# Patient Record
Sex: Male | Born: 1945
Health system: Southern US, Community
[De-identification: ages and names within clinical notes are randomized; demographics above are authoritative.]

## PROBLEM LIST (undated history)

## (undated) DIAGNOSIS — K5520 Angiodysplasia of colon without hemorrhage: Secondary | ICD-10-CM

## (undated) DIAGNOSIS — I10 Essential (primary) hypertension: Secondary | ICD-10-CM

## (undated) DIAGNOSIS — I350 Nonrheumatic aortic (valve) stenosis: Secondary | ICD-10-CM

## (undated) DIAGNOSIS — M79606 Pain in leg, unspecified: Secondary | ICD-10-CM

## (undated) DIAGNOSIS — N529 Male erectile dysfunction, unspecified: Secondary | ICD-10-CM

## (undated) DIAGNOSIS — C801 Malignant (primary) neoplasm, unspecified: Secondary | ICD-10-CM

## (undated) DIAGNOSIS — E119 Type 2 diabetes mellitus without complications: Secondary | ICD-10-CM

## (undated) DIAGNOSIS — R972 Elevated prostate specific antigen [PSA]: Secondary | ICD-10-CM

## (undated) DIAGNOSIS — Z9289 Personal history of other medical treatment: Secondary | ICD-10-CM

## (undated) DIAGNOSIS — E669 Obesity, unspecified: Secondary | ICD-10-CM

## (undated) DIAGNOSIS — E039 Hypothyroidism, unspecified: Secondary | ICD-10-CM

## (undated) DIAGNOSIS — E785 Hyperlipidemia, unspecified: Secondary | ICD-10-CM

## (undated) DIAGNOSIS — R011 Cardiac murmur, unspecified: Secondary | ICD-10-CM

## (undated) HISTORY — DX: Nonrheumatic aortic (valve) stenosis: I35.0

## (undated) HISTORY — DX: Type 2 diabetes mellitus without complications: E11.9

## (undated) HISTORY — PX: COLONOSCOPY: SHX174

## (undated) HISTORY — DX: Cardiac murmur, unspecified: R01.1

## (undated) HISTORY — PX: POLYPECTOMY: SHX149

## (undated) HISTORY — DX: Hyperlipidemia, unspecified: E78.5

## (undated) HISTORY — DX: Elevated prostate specific antigen (PSA): R97.20

## (undated) HISTORY — DX: Personal history of other medical treatment: Z92.89

## (undated) HISTORY — DX: Essential (primary) hypertension: I10

## (undated) HISTORY — DX: Malignant (primary) neoplasm, unspecified: C80.1

## (undated) HISTORY — DX: Pain in leg, unspecified: M79.606

## (undated) HISTORY — DX: Obesity, unspecified: E66.9

## (undated) HISTORY — DX: Hypothyroidism, unspecified: E03.9

## (undated) HISTORY — DX: Angiodysplasia of colon without hemorrhage: K55.20

## (undated) HISTORY — PX: COLONOSCOPY W/ BIOPSIES AND POLYPECTOMY: SHX1376

## (undated) HISTORY — DX: Male erectile dysfunction, unspecified: N52.9

---

## 2001-03-23 ENCOUNTER — Encounter: Payer: Self-pay | Admitting: Family Medicine

## 2001-03-23 ENCOUNTER — Encounter: Admission: RE | Admit: 2001-03-23 | Discharge: 2001-03-23 | Payer: Self-pay | Admitting: Family Medicine

## 2004-01-16 ENCOUNTER — Encounter: Admission: RE | Admit: 2004-01-16 | Discharge: 2004-01-16 | Payer: Self-pay | Admitting: Family Medicine

## 2004-06-25 ENCOUNTER — Ambulatory Visit: Payer: Self-pay | Admitting: Family Medicine

## 2004-10-29 ENCOUNTER — Ambulatory Visit: Payer: Self-pay | Admitting: Family Medicine

## 2004-11-03 ENCOUNTER — Ambulatory Visit: Payer: Self-pay | Admitting: Family Medicine

## 2004-12-17 ENCOUNTER — Ambulatory Visit: Payer: Self-pay | Admitting: Family Medicine

## 2004-12-24 ENCOUNTER — Ambulatory Visit: Payer: Self-pay | Admitting: Family Medicine

## 2005-01-07 ENCOUNTER — Ambulatory Visit: Payer: Self-pay | Admitting: Family Medicine

## 2005-02-11 ENCOUNTER — Ambulatory Visit: Payer: Self-pay | Admitting: Family Medicine

## 2005-03-11 ENCOUNTER — Ambulatory Visit: Payer: Self-pay | Admitting: Family Medicine

## 2005-04-06 ENCOUNTER — Ambulatory Visit: Payer: Self-pay | Admitting: Gastroenterology

## 2005-04-19 ENCOUNTER — Ambulatory Visit: Payer: Self-pay | Admitting: Gastroenterology

## 2005-04-19 ENCOUNTER — Encounter (INDEPENDENT_AMBULATORY_CARE_PROVIDER_SITE_OTHER): Payer: Self-pay | Admitting: Specialist

## 2005-12-30 ENCOUNTER — Ambulatory Visit: Payer: Self-pay | Admitting: Family Medicine

## 2006-01-06 ENCOUNTER — Ambulatory Visit: Payer: Self-pay | Admitting: Family Medicine

## 2006-02-24 ENCOUNTER — Ambulatory Visit: Payer: Self-pay | Admitting: Family Medicine

## 2006-03-24 ENCOUNTER — Ambulatory Visit: Payer: Self-pay | Admitting: Family Medicine

## 2006-12-19 ENCOUNTER — Ambulatory Visit: Payer: Self-pay | Admitting: Family Medicine

## 2007-01-05 ENCOUNTER — Ambulatory Visit: Payer: Self-pay | Admitting: Family Medicine

## 2007-01-05 LAB — CONVERTED CEMR LAB
ALT: 26 units/L (ref 0–40)
AST: 24 units/L (ref 0–37)
Albumin: 4 g/dL (ref 3.5–5.2)
Alkaline Phosphatase: 54 units/L (ref 39–117)
BUN: 18 mg/dL (ref 6–23)
Basophils Absolute: 0 10*3/uL (ref 0.0–0.1)
Basophils Relative: 0.7 % (ref 0.0–1.0)
Bilirubin, Direct: 0.1 mg/dL (ref 0.0–0.3)
CO2: 30 meq/L (ref 19–32)
Calcium: 9 mg/dL (ref 8.4–10.5)
Chloride: 103 meq/L (ref 96–112)
Cholesterol: 160 mg/dL (ref 0–200)
Creatinine, Ser: 0.9 mg/dL (ref 0.4–1.5)
Creatinine,U: 146.5 mg/dL
Eosinophils Absolute: 0.2 10*3/uL (ref 0.0–0.6)
Eosinophils Relative: 4.1 % (ref 0.0–5.0)
GFR calc Af Amer: 111 mL/min
GFR calc non Af Amer: 92 mL/min
Glucose, Bld: 132 mg/dL — ABNORMAL HIGH (ref 70–99)
HCT: 39.7 % (ref 39.0–52.0)
HDL: 51.5 mg/dL (ref >39.0)
Hemoglobin: 13.2 g/dL (ref 13.0–17.0)
Hgb A1c MFr Bld: 7 % — ABNORMAL HIGH (ref 4.6–6.0)
LDL Cholesterol: 76 mg/dL (ref 0–99)
Lymphocytes Relative: 29.7 % (ref 12.0–46.0)
MCHC: 33.1 g/dL (ref 30.0–36.0)
MCV: 83.3 fL (ref 78.0–100.0)
Microalb Creat Ratio: 3.4 mg/g (ref 0.0–30.0)
Microalb, Ur: 0.5 mg/dL (ref 0.0–1.9)
Monocytes Absolute: 0.4 10*3/uL (ref 0.2–0.7)
Monocytes Relative: 7 % (ref 3.0–11.0)
Neutro Abs: 3.5 10*3/uL (ref 1.4–7.7)
Neutrophils Relative %: 58.5 % (ref 43.0–77.0)
PSA: 1.99 ng/mL (ref 0.10–4.00)
Platelets: 249 10*3/uL (ref 150–400)
Potassium: 4.1 meq/L (ref 3.5–5.1)
RBC: 4.76 M/uL (ref 4.22–5.81)
RDW: 13.4 % (ref 11.5–14.6)
Sodium: 141 meq/L (ref 135–145)
TSH: 4.41 microintl units/mL (ref 0.35–5.50)
Total Bilirubin: 0.7 mg/dL (ref 0.3–1.2)
Total CHOL/HDL Ratio: 3.1
Total Protein: 7 g/dL (ref 6.0–8.3)
Triglycerides: 162 mg/dL — ABNORMAL HIGH (ref 0–149)
VLDL: 32 mg/dL (ref 0–40)
WBC: 5.9 10*3/uL (ref 4.5–10.5)

## 2007-01-12 ENCOUNTER — Ambulatory Visit: Payer: Self-pay | Admitting: Family Medicine

## 2007-07-06 ENCOUNTER — Ambulatory Visit: Payer: Self-pay | Admitting: Family Medicine

## 2007-07-06 DIAGNOSIS — D485 Neoplasm of uncertain behavior of skin: Secondary | ICD-10-CM

## 2007-07-06 DIAGNOSIS — E119 Type 2 diabetes mellitus without complications: Secondary | ICD-10-CM

## 2007-07-06 DIAGNOSIS — E039 Hypothyroidism, unspecified: Secondary | ICD-10-CM

## 2007-07-06 DIAGNOSIS — E785 Hyperlipidemia, unspecified: Secondary | ICD-10-CM | POA: Insufficient documentation

## 2007-07-06 DIAGNOSIS — I10 Essential (primary) hypertension: Secondary | ICD-10-CM

## 2007-07-06 HISTORY — DX: Hyperlipidemia, unspecified: E78.5

## 2007-07-06 HISTORY — DX: Essential (primary) hypertension: I10

## 2007-07-06 HISTORY — DX: Hypothyroidism, unspecified: E03.9

## 2007-07-06 HISTORY — DX: Type 2 diabetes mellitus without complications: E11.9

## 2007-07-06 LAB — CONVERTED CEMR LAB
BUN: 19 mg/dL (ref 6–23)
CO2: 31 meq/L (ref 19–32)
Calcium: 9.3 mg/dL (ref 8.4–10.5)
Chloride: 105 meq/L (ref 96–112)
Creatinine, Ser: 1.1 mg/dL (ref 0.4–1.5)
GFR calc Af Amer: 88 mL/min
GFR calc non Af Amer: 73 mL/min
Glucose, Bld: 132 mg/dL — ABNORMAL HIGH (ref 70–99)
Hgb A1c MFr Bld: 6.9 % — ABNORMAL HIGH (ref 4.6–6.0)
Potassium: 3.8 meq/L (ref 3.5–5.1)
Sodium: 143 meq/L (ref 135–145)

## 2007-08-03 ENCOUNTER — Ambulatory Visit: Payer: Self-pay | Admitting: Family Medicine

## 2007-08-03 DIAGNOSIS — L820 Inflamed seborrheic keratosis: Secondary | ICD-10-CM

## 2007-12-07 ENCOUNTER — Ambulatory Visit: Payer: Self-pay | Admitting: Family Medicine

## 2007-12-07 LAB — CONVERTED CEMR LAB
ALT: 28 units/L (ref 0–53)
AST: 24 units/L (ref 0–37)
Albumin: 3.8 g/dL (ref 3.5–5.2)
Alkaline Phosphatase: 44 units/L (ref 39–117)
BUN: 18 mg/dL (ref 6–23)
Basophils Absolute: 0 10*3/uL (ref 0.0–0.1)
Basophils Relative: 0.2 % (ref 0.0–1.0)
Bilirubin Urine: NEGATIVE
Bilirubin, Direct: 0.1 mg/dL (ref 0.0–0.3)
CO2: 32 meq/L (ref 19–32)
Calcium: 8.9 mg/dL (ref 8.4–10.5)
Chloride: 107 meq/L (ref 96–112)
Cholesterol: 178 mg/dL (ref 0–200)
Creatinine, Ser: 0.9 mg/dL (ref 0.4–1.5)
Creatinine,U: 129.6 mg/dL
Eosinophils Absolute: 0.2 10*3/uL (ref 0.0–0.7)
Eosinophils Relative: 4.7 % (ref 0.0–5.0)
GFR calc Af Amer: 111 mL/min
GFR calc non Af Amer: 91 mL/min
Glucose, Bld: 123 mg/dL — ABNORMAL HIGH (ref 70–99)
Glucose, Urine, Semiquant: NEGATIVE
HCT: 38.4 % — ABNORMAL LOW (ref 39.0–52.0)
HDL: 57.4 mg/dL (ref >39.0)
Hemoglobin: 13 g/dL (ref 13.0–17.0)
Hgb A1c MFr Bld: 6.9 % — ABNORMAL HIGH (ref 4.6–6.0)
Ketones, urine, test strip: NEGATIVE
LDL Cholesterol: 96 mg/dL (ref 0–99)
Lymphocytes Relative: 37.2 % (ref 12.0–46.0)
MCHC: 33.7 g/dL (ref 30.0–36.0)
MCV: 83.3 fL (ref 78.0–100.0)
Microalb Creat Ratio: 17.7 mg/g (ref 0.0–30.0)
Microalb, Ur: 2.3 mg/dL — ABNORMAL HIGH (ref 0.0–1.9)
Monocytes Absolute: 0.3 10*3/uL (ref 0.1–1.0)
Monocytes Relative: 7.4 % (ref 3.0–12.0)
Neutro Abs: 2.1 10*3/uL (ref 1.4–7.7)
Neutrophils Relative %: 50.5 % (ref 43.0–77.0)
Nitrite: NEGATIVE
PSA: 2.03 ng/mL (ref 0.10–4.00)
Platelets: 204 10*3/uL (ref 150–400)
Potassium: 3.7 meq/L (ref 3.5–5.1)
Protein, U semiquant: NEGATIVE
RBC: 4.61 M/uL (ref 4.22–5.81)
RDW: 13.5 % (ref 11.5–14.6)
Sodium: 143 meq/L (ref 135–145)
Specific Gravity, Urine: 1.02
TSH: 6.81 microintl units/mL — ABNORMAL HIGH (ref 0.35–5.50)
Total Bilirubin: 0.8 mg/dL (ref 0.3–1.2)
Total CHOL/HDL Ratio: 3.1
Total Protein: 6.4 g/dL (ref 6.0–8.3)
Triglycerides: 121 mg/dL (ref 0–149)
Urobilinogen, UA: 0.2
VLDL: 24 mg/dL (ref 0–40)
WBC Urine, dipstick: NEGATIVE
WBC: 4.2 10*3/uL — ABNORMAL LOW (ref 4.5–10.5)
pH: 6

## 2007-12-14 ENCOUNTER — Ambulatory Visit: Payer: Self-pay | Admitting: Family Medicine

## 2007-12-14 DIAGNOSIS — F528 Other sexual dysfunction not due to a substance or known physiological condition: Secondary | ICD-10-CM

## 2007-12-14 DIAGNOSIS — N529 Male erectile dysfunction, unspecified: Secondary | ICD-10-CM | POA: Insufficient documentation

## 2008-01-11 ENCOUNTER — Ambulatory Visit: Payer: Self-pay | Admitting: Family Medicine

## 2008-05-23 ENCOUNTER — Ambulatory Visit: Payer: Self-pay | Admitting: Family Medicine

## 2008-12-12 ENCOUNTER — Ambulatory Visit: Payer: Self-pay | Admitting: Family Medicine

## 2008-12-12 LAB — CONVERTED CEMR LAB
ALT: 34 units/L (ref 0–53)
AST: 25 units/L (ref 0–37)
Albumin: 3.9 g/dL (ref 3.5–5.2)
Alkaline Phosphatase: 47 units/L (ref 39–117)
BUN: 16 mg/dL (ref 6–23)
Basophils Absolute: 0 10*3/uL (ref 0.0–0.1)
Basophils Relative: 0.3 % (ref 0.0–3.0)
Bilirubin Urine: NEGATIVE
Bilirubin, Direct: 0.1 mg/dL (ref 0.0–0.3)
Blood in Urine, dipstick: NEGATIVE
CO2: 33 meq/L — ABNORMAL HIGH (ref 19–32)
Calcium: 9.1 mg/dL (ref 8.4–10.5)
Chloride: 105 meq/L (ref 96–112)
Cholesterol: 186 mg/dL (ref 0–200)
Creatinine, Ser: 0.9 mg/dL (ref 0.4–1.5)
Creatinine,U: 110.2 mg/dL
Eosinophils Absolute: 0.3 10*3/uL (ref 0.0–0.7)
Eosinophils Relative: 6.6 % — ABNORMAL HIGH (ref 0.0–5.0)
GFR calc non Af Amer: 110.21 mL/min (ref >60)
Glucose, Bld: 118 mg/dL — ABNORMAL HIGH (ref 70–99)
Glucose, Urine, Semiquant: NEGATIVE
HCT: 37.4 % — ABNORMAL LOW (ref 39.0–52.0)
HDL: 56.6 mg/dL (ref >39.00)
Hemoglobin: 12.7 g/dL — ABNORMAL LOW (ref 13.0–17.0)
Hgb A1c MFr Bld: 6.6 % — ABNORMAL HIGH (ref 4.6–6.5)
Ketones, urine, test strip: NEGATIVE
LDL Cholesterol: 100 mg/dL — ABNORMAL HIGH (ref 0–99)
Lymphocytes Relative: 38.4 % (ref 12.0–46.0)
Lymphs Abs: 1.5 10*3/uL (ref 0.7–4.0)
MCHC: 34 g/dL (ref 30.0–36.0)
MCV: 84 fL (ref 78.0–100.0)
Microalb Creat Ratio: 0.9 mg/g (ref 0.0–30.0)
Microalb, Ur: 0.1 mg/dL (ref 0.0–1.9)
Monocytes Absolute: 0.4 10*3/uL (ref 0.1–1.0)
Monocytes Relative: 9.4 % (ref 3.0–12.0)
Neutro Abs: 1.8 10*3/uL (ref 1.4–7.7)
Neutrophils Relative %: 45.3 % (ref 43.0–77.0)
Nitrite: NEGATIVE
Platelets: 181 10*3/uL (ref 150.0–400.0)
Potassium: 4.4 meq/L (ref 3.5–5.1)
Protein, U semiquant: NEGATIVE
RBC: 4.45 M/uL (ref 4.22–5.81)
RDW: 13.7 % (ref 11.5–14.6)
Sodium: 144 meq/L (ref 135–145)
Specific Gravity, Urine: 1.01
TSH: 5.34 microintl units/mL (ref 0.35–5.50)
Total Bilirubin: 1 mg/dL (ref 0.3–1.2)
Total CHOL/HDL Ratio: 3
Total Protein: 6.7 g/dL (ref 6.0–8.3)
Triglycerides: 146 mg/dL (ref 0.0–149.0)
Urobilinogen, UA: 0.2
VLDL: 29.2 mg/dL (ref 0.0–40.0)
WBC Urine, dipstick: NEGATIVE
WBC: 4 10*3/uL — ABNORMAL LOW (ref 4.5–10.5)
pH: 7

## 2008-12-15 DIAGNOSIS — K625 Hemorrhage of anus and rectum: Secondary | ICD-10-CM | POA: Insufficient documentation

## 2008-12-18 ENCOUNTER — Encounter: Payer: Self-pay | Admitting: Family Medicine

## 2008-12-19 ENCOUNTER — Ambulatory Visit: Payer: Self-pay | Admitting: Family Medicine

## 2009-06-05 ENCOUNTER — Ambulatory Visit: Payer: Self-pay | Admitting: Family Medicine

## 2009-06-05 LAB — CONVERTED CEMR LAB
BUN: 21 mg/dL (ref 6–23)
CO2: 30 meq/L (ref 19–32)
Chloride: 104 meq/L (ref 96–112)
Creatinine, Ser: 1 mg/dL (ref 0.4–1.5)
Glucose, Bld: 122 mg/dL — ABNORMAL HIGH (ref 70–99)
Potassium: 3.8 meq/L (ref 3.5–5.1)

## 2009-06-13 ENCOUNTER — Ambulatory Visit: Payer: Self-pay | Admitting: Family Medicine

## 2009-06-24 ENCOUNTER — Ambulatory Visit: Payer: Self-pay | Admitting: Family Medicine

## 2009-07-24 ENCOUNTER — Ambulatory Visit: Payer: Self-pay | Admitting: Family Medicine

## 2009-08-25 ENCOUNTER — Ambulatory Visit: Payer: Self-pay | Admitting: Family Medicine

## 2009-09-25 ENCOUNTER — Ambulatory Visit: Payer: Self-pay | Admitting: Family Medicine

## 2009-12-25 ENCOUNTER — Ambulatory Visit: Payer: Self-pay | Admitting: Family Medicine

## 2009-12-25 LAB — CONVERTED CEMR LAB
AST: 23 units/L (ref 0–37)
Alkaline Phosphatase: 54 units/L (ref 39–117)
BUN: 15 mg/dL (ref 6–23)
Basophils Absolute: 0 10*3/uL (ref 0.0–0.1)
Creatinine, Ser: 0.9 mg/dL (ref 0.4–1.5)
Creatinine,U: 172.5 mg/dL
Direct LDL: 174.8 mg/dL
Eosinophils Relative: 3.1 % (ref 0.0–5.0)
Glucose, Bld: 129 mg/dL — ABNORMAL HIGH (ref 70–99)
HDL: 66.8 mg/dL (ref >39.00)
Hemoglobin: 13.2 g/dL (ref 13.0–17.0)
Ketones, urine, test strip: NEGATIVE
Lymphocytes Relative: 28.2 % (ref 12.0–46.0)
Microalb Creat Ratio: 0.6 mg/g (ref 0.0–30.0)
Monocytes Relative: 7.7 % (ref 3.0–12.0)
Neutro Abs: 2.8 10*3/uL (ref 1.4–7.7)
Nitrite: NEGATIVE
Platelets: 195 10*3/uL (ref 150.0–400.0)
Protein, U semiquant: NEGATIVE
RDW: 14.9 % — ABNORMAL HIGH (ref 11.5–14.6)
Sodium: 145 meq/L (ref 135–145)
Specific Gravity, Urine: 1.02
Total Bilirubin: 0.8 mg/dL (ref 0.3–1.2)
Total CHOL/HDL Ratio: 4
Triglycerides: 145 mg/dL (ref 0.0–149.0)
Urobilinogen, UA: 0.2
VLDL: 29 mg/dL (ref 0.0–40.0)
WBC Urine, dipstick: NEGATIVE
WBC: 4.7 10*3/uL (ref 4.5–10.5)

## 2010-01-01 ENCOUNTER — Ambulatory Visit: Payer: Self-pay | Admitting: Family Medicine

## 2010-01-07 ENCOUNTER — Telehealth: Payer: Self-pay | Admitting: Family Medicine

## 2010-02-13 ENCOUNTER — Encounter (INDEPENDENT_AMBULATORY_CARE_PROVIDER_SITE_OTHER): Payer: Self-pay | Admitting: *Deleted

## 2010-02-26 ENCOUNTER — Ambulatory Visit: Payer: Self-pay | Admitting: Family Medicine

## 2010-02-26 LAB — CONVERTED CEMR LAB
ALT: 28 units/L (ref 0–53)
AST: 23 units/L (ref 0–37)
Albumin: 4 g/dL (ref 3.5–5.2)
Alkaline Phosphatase: 49 units/L (ref 39–117)
Calcium: 8.9 mg/dL (ref 8.4–10.5)
Cholesterol: 220 mg/dL — ABNORMAL HIGH (ref 0–200)
Creatinine, Ser: 0.9 mg/dL (ref 0.4–1.5)
Direct LDL: 141.4 mg/dL
Hgb A1c MFr Bld: 6.9 % — ABNORMAL HIGH (ref 4.6–6.5)
Total Protein: 6.4 g/dL (ref 6.0–8.3)

## 2010-03-05 ENCOUNTER — Ambulatory Visit: Payer: Self-pay | Admitting: Family Medicine

## 2010-04-02 ENCOUNTER — Ambulatory Visit: Payer: Self-pay | Admitting: Family Medicine

## 2010-05-11 ENCOUNTER — Ambulatory Visit: Payer: Self-pay | Admitting: Family Medicine

## 2010-06-04 ENCOUNTER — Ambulatory Visit: Payer: Self-pay | Admitting: Family Medicine

## 2010-06-09 LAB — CONVERTED CEMR LAB
CO2: 30 meq/L (ref 19–32)
Calcium: 8.6 mg/dL (ref 8.4–10.5)
Creatinine, Ser: 1 mg/dL (ref 0.4–1.5)
Hgb A1c MFr Bld: 7.2 % — ABNORMAL HIGH (ref 4.6–6.5)

## 2010-09-03 ENCOUNTER — Ambulatory Visit
Admission: RE | Admit: 2010-09-03 | Discharge: 2010-09-03 | Payer: Self-pay | Source: Home / Self Care | Attending: Family Medicine | Admitting: Family Medicine

## 2010-09-03 ENCOUNTER — Other Ambulatory Visit: Payer: Self-pay | Admitting: Family Medicine

## 2010-09-03 LAB — BASIC METABOLIC PANEL
BUN: 15 mg/dL (ref 6–23)
CO2: 32 mEq/L (ref 19–32)
Calcium: 8.6 mg/dL (ref 8.4–10.5)
Chloride: 102 mEq/L (ref 96–112)
Creatinine, Ser: 0.8 mg/dL (ref 0.4–1.5)
GFR: 131.2 mL/min (ref >60.00)
Glucose, Bld: 118 mg/dL — ABNORMAL HIGH (ref 70–99)
Potassium: 3.5 mEq/L (ref 3.5–5.1)
Sodium: 140 mEq/L (ref 135–145)

## 2010-09-03 LAB — HEMOGLOBIN A1C: Hgb A1c MFr Bld: 7.5 % — ABNORMAL HIGH (ref 4.6–6.5)

## 2010-09-10 ENCOUNTER — Telehealth: Payer: Self-pay | Admitting: Family Medicine

## 2010-09-10 ENCOUNTER — Ambulatory Visit
Admission: RE | Admit: 2010-09-10 | Discharge: 2010-09-10 | Payer: Self-pay | Source: Home / Self Care | Attending: Family Medicine | Admitting: Family Medicine

## 2010-09-24 NOTE — Assessment & Plan Note (Signed)
Summary: 1 month rov/njr/pt rsc/cjr   Vital Signs:  Patient profile:   65 year old male Weight:      240 pounds Temp:     98.2 degrees F oral Pulse rate:   60 / minute BP sitting:   160 / 90  (left arm) Cuff size:   regular  Vitals Entered By: Kern Reap CMA Duncan Dull) (May 11, 2010 11:41 AM)  CC: follow-up visit Is Patient Diabetic? Yes Pain Assessment Patient in pain? no        CC:  follow-up visit.  History of Present Illness: Matthew Serrano is a 65 year old male, who comes in today for reevaluation of hypertension.  His blood pressure has been difficult to control.  He is currently on Monopril 80 mg q.a.m. Catapres, .4, b.i.d., Cardura 8 mg nightly labetalol 600 mg in the morning and 4 mg at bedtime, and Lasix 40 mg daily.  BP 160/90  Allergies: No Known Drug Allergies  Past History:  Past medical, surgical, family and social histories (including risk factors) reviewed for relevance to current acute and chronic problems.  Past Medical History: Reviewed history from 12/14/2007 and no changes required. Diabetes mellitus, type II Hyperlipidemia Hypertension Hypothyroidism erectile dysfunction  Past Surgical History: Reviewed history from 12/14/2007 and no changes required. Denies surgical history  Family History: Reviewed history from 07/06/2007 and no changes required. Family History Hypertension  Social History: Reviewed history from 07/06/2007 and no changes required. Single Current Smoker Alcohol use-no Drug use-no Regular exercise-no  Review of Systems      See HPI       Flu Vaccine Consent Questions     Do you have a history of severe allergic reactions to this vaccine? no    Any prior history of allergic reactions to egg and/or gelatin? no    Do you have a sensitivity to the preservative Thimersol? no    Do you have a past history of Guillan-Barre Syndrome? no    Do you currently have an acute febrile illness? no    Have you ever had a severe  reaction to latex? no    Vaccine information given and explained to patient? yes    Are you currently pregnant? no    Lot Number:[id number]   Exp Date:02/19/2010   Site Given  Left Deltoid IM   Physical Exam  General:  Well-developed,well-nourished,in no acute distress; alert,appropriate and cooperative throughout examination Heart:  160/90 right arm sitting position   Impression & Recommendations:  Problem # 1:  HYPERTENSION (ICD-401.9) Assessment Unchanged  His updated medication list for this problem includes:    Monopril 40 Mg Tabs (Fosinopril sodium) .Marland Kitchen... Take 2 tablet by mouth twice a day    Catapres 0.2 Mg Tabs (Clonidine hcl) .Marland Kitchen... 2 in am, 2 in pm    Doxazosin Mesylate 8 Mg Tabs (Doxazosin mesylate) .Marland Kitchen... Take one tab once daily    Labetalol Hcl 200 Mg Tabs (Labetalol hcl) .Marland KitchenMarland KitchenMarland KitchenMarland Kitchen 3  in am, 2 in pm    Furosemide 40 Mg Tabs (Furosemide) .Marland Kitchen... Take 1 tablet by mouth every morning  Complete Medication List: 1)  Glucophage 500 Mg Tabs (Metformin hcl) .... Take 1 tablet by mouth twice a day 2)  Glucotrol Xl 10 Mg Tb24 (Glipizide) .... Take 1 tablet by mouth twice a day 3)  Klor-con M20 20 Meq Tbcr (Potassium chloride crys cr) .... 2 by mouth daily 4)  Monopril 40 Mg Tabs (Fosinopril sodium) .... Take 2 tablet by mouth twice a day 5)  Viagra  50 Mg Tabs (Sildenafil citrate) .... Take 1/2 tablet by mouth as directed 6)  Catapres 0.2 Mg Tabs (Clonidine hcl) .... 2 in am, 2 in pm 7)  Ascriptin 325 Mg Tabs (Aspirin buf(alhyd-mghyd-cacar)) .... Once daily 8)  Doxazosin Mesylate 8 Mg Tabs (Doxazosin mesylate) .... Take one tab once daily 9)  Labetalol Hcl 200 Mg Tabs (Labetalol hcl) .... 3  in am, 2 in pm 10)  Synthroid 150 Mcg Tabs (Levothyroxine sodium) .... Take 1 tablet by mouth every morning 11)  Simvastatin 80 Mg Tabs (Simvastatin) .... Take one tab by mouth at bedtime 12)  Furosemide 40 Mg Tabs (Furosemide) .... Take 1 tablet by mouth every morning  Other Orders: Admin 1st  Vaccine (04540) Flu Vaccine 40yrs + (98119)  Patient Instructions: 1)  double the Lasix, take two of the 40-mg tablets in the morning.  Check a morning blood pressure and pulse daily.  Return in 3 weeks for follow-up

## 2010-09-24 NOTE — Letter (Signed)
Summary: Colonoscopy Letter  [facility]  [address] [place], [place] [address]   Phone: [phone]  Fax: [phone]      February 13, 2010 MRN: [id number]   Matthew Serrano [address] [place], [place]  [address]   Dear [name],   According to your medical record, it is time for you to schedule a Colonoscopy. The American Cancer Society recommends this procedure as a method to detect early colon cancer. Patients with a family history of colon cancer, or a personal history of colon polyps or inflammatory bowel disease are at increased risk.  This letter has beeen generated based on the recommendations made at the time of your procedure. If you feel that in your particular situation this may no longer apply, please contact our office.  Please call our office at [phone] to schedule this appointment or to update your records at your earliest convenience.  Thank you for cooperating with [place] to provide you with the very best care possible.   Sincerely,  Judie Petit T. Russella Dar, M.D.  [facility] [phone]

## 2010-09-24 NOTE — Assessment & Plan Note (Signed)
Summary: 4 WK ROV//SLM   Vital Signs:  Patient profile:   65 year old male Height:      68 inches Weight:      234 pounds BMI:     35.71 Temp:     98.7 degrees F oral BP sitting:   158 / 90  (left arm) Cuff size:   regular  Vitals Entered By: Kern Reap CMA Duncan Dull) (September 25, 2009 10:15 AM)  Reason for Visit follow up blood pressure  History of Present Illness: Matthew Serrano is a 65 year old male, married, nonsmoker, who comes in today for evaluation of hypertension.  His current treatment program is Monopril 80 mg b.i.d., Catapres, .4 mg b.i.d., Lasix, 20 mg b.i.d., Cardura 8 mg daily, labetalol 400 mg in the morning and 200 mg in the evening.  His BP is down in the 140 to 150 range, diastolic 80 to 90.  Pulse is 70 and regular.  Blood sugar in the 120 range.  A1c October 6.5  Allergies: No Known Drug Allergies  Past History:  Past medical, surgical, family and social histories (including risk factors) reviewed for relevance to current acute and chronic problems.  Past Medical History: Reviewed history from 12/14/2007 and no changes required. Diabetes mellitus, type II Hyperlipidemia Hypertension Hypothyroidism erectile dysfunction  Past Surgical History: Reviewed history from 12/14/2007 and no changes required. Denies surgical history  Family History: Reviewed history from 07/06/2007 and no changes required. Family History Hypertension  Social History: Reviewed history from 07/06/2007 and no changes required. Single Current Smoker Alcohol use-no Drug use-no Regular exercise-no  Review of Systems      See HPI  Physical Exam  General:  Well-developed,well-nourished,in no acute distress; alert,appropriate and cooperative throughout examination Heart:  160/90   Impression & Recommendations:  Problem # 1:  HYPERTENSION (ICD-401.9) Assessment Improved  His updated medication list for this problem includes:    Monopril 40 Mg Tabs (Fosinopril sodium)  .Marland Kitchen... Take 2 tablet by mouth twice a day    Catapres 0.2 Mg Tabs (Clonidine hcl) .Marland Kitchen... 2 in am, 2 in pm    Furosemide 20 Mg Tabs (Furosemide) .Marland Kitchen... Take one tab twice daily    Doxazosin Mesylate 8 Mg Tabs (Doxazosin mesylate) .Marland Kitchen... Take one tab once daily    Labetalol Hcl 200 Mg Tabs (Labetalol hcl) .Marland Kitchen... 2 in am, 2 in pm  Orders: Prescription Created Electronically [id number])  Complete Medication List: 1)  Glucophage 500 Mg Tabs (Metformin hcl) .... Take 1 tablet by mouth twice a day 2)  Glucotrol Xl 10 Mg Tb24 (Glipizide) .... Take 1 tablet by mouth twice a day 3)  Klor-con M20 20 Meq Tbcr (Potassium chloride crys cr) .... 2 by mouth daily 4)  Monopril 40 Mg Tabs (Fosinopril sodium) .... Take 2 tablet by mouth twice a day 5)  Viagra 50 Mg Tabs (Sildenafil citrate) .... Take 1/2 tablet by mouth as directed 6)  Catapres 0.2 Mg Tabs (Clonidine hcl) .... 2 in am, 2 in pm 7)  Furosemide 20 Mg Tabs (Furosemide) .... Take one tab twice daily 8)  Synthroid 100 Mcg Tabs (Levothyroxine sodium) .... Once daily 9)  Ascriptin 325 Mg Tabs (Aspirin buf(alhyd-mghyd-cacar)) .... Once daily 10)  Crestor 40 Mg Tabs (Rosuvastatin calcium) .Marland Kitchen.. 1 tab @ bedtime 11)  Doxazosin Mesylate 8 Mg Tabs (Doxazosin mesylate) .... Take one tab once daily 12)  Labetalol Hcl 200 Mg Tabs (Labetalol hcl) .... 2 in am, 2 in pm  Patient Instructions: 1)  increase the labetalol to  two tabs in the morning and two tabs at bedtime.  Check a morning blood pressure daily.  If in 4 weeks, your blood pressure is normal.  Continue that dose.  If not increase the labetalol to 3 tablets in the morning and two tablets at bedtime.  Return in 6 weeks for follow-up.  2)  If her pulse rate drops below 50 or he began to feel lightheaded when you stand up. ............ Call me. Prescriptions: LABETALOL HCL 200 MG TABS (LABETALOL HCL) 2 in am, 2 in pm  #400 x 3   Entered and Authorized by:   Roderick Pee MD   Signed by:   Roderick Pee MD on  09/25/2009   Method used:   [facility]        [facility]  [address] [phone]* (retail)       [address]       [place], [place]  [address]       Ph: [phone] or [phone]       Fax: [phone]   RxID:   [id number]

## 2010-09-24 NOTE — Assessment & Plan Note (Signed)
Summary: 63mo rov/mm   Vital Signs:  Patient profile:   65 year old male Weight:      238 pounds Temp:     98.5 degrees F oral BP sitting:   140 / 92  (left arm) Cuff size:   regular  Vitals Entered By: Kern Reap CMA Duncan Dull) (August 25, 2009 9:03 AM)  Reason for Visit follow up HTN  History of Present Illness: Matthew Serrano is a 65 year old male, nonsmoker, with diabetes and hypertension who comes in today for follow-up.  His diabetes is well controlled, although his fasting blood sugars average around 130 his hemoglobin A1c is 6.5%.  Last done two months ago.  His blood pressure runs 144/84.  His current medications are Monopril 40 mg b.i.d., Catapres .4, b.i.d., potassium 40 mEq.  Daily, Lasix, 20 mg b.i.d., Cardura, 8 mg nightly, and labetalol 200 mg b.i.d.  No side effects from medication.  Heart rate 60 to 70  Diabetes Management History:      He says that he is not exercising regularly.    Allergies: No Known Drug Allergies  Past History:  Past medical, surgical, family and social histories (including risk factors) reviewed for relevance to current acute and chronic problems.  Past Medical History: Reviewed history from 12/14/2007 and no changes required. Diabetes mellitus, type II Hyperlipidemia Hypertension Hypothyroidism erectile dysfunction  Past Surgical History: Reviewed history from 12/14/2007 and no changes required. Denies surgical history  Family History: Reviewed history from 07/06/2007 and no changes required. Family History Hypertension  Social History: Reviewed history from 07/06/2007 and no changes required. Single Current Smoker Alcohol use-no Drug use-no Regular exercise-no  Review of Systems      See HPI  Physical Exam  General:  Well-developed,well-nourished,in no acute distress; alert,appropriate and cooperative throughout examination Heart:  144/84 right arm sitting position   Impression & Recommendations:  Problem # 1:   HYPERTENSION (ICD-401.9) Assessment Improved  His updated medication list for this problem includes:    Monopril 40 Mg Tabs (Fosinopril sodium) .Marland Kitchen... Take 2 tablet by mouth twice a day    Catapres 0.2 Mg Tabs (Clonidine hcl) .Marland Kitchen... 2 in am, 2 in pm    Furosemide 20 Mg Tabs (Furosemide) .Marland Kitchen... Take one tab twice daily    Doxazosin Mesylate 8 Mg Tabs (Doxazosin mesylate) .Marland Kitchen... Take one tab once daily    Labetalol Hcl 200 Mg Tabs (Labetalol hcl) .Marland Kitchen... 2 in am, 1 in pm  Orders: Prescription Created Electronically [id number])  Complete Medication List: 1)  Glucophage 500 Mg Tabs (Metformin hcl) .... Take 1 tablet by mouth twice a day 2)  Glucotrol Xl 10 Mg Tb24 (Glipizide) .... Take 1 tablet by mouth twice a day 3)  Klor-con M20 20 Meq Tbcr (Potassium chloride crys cr) .... 2 by mouth daily 4)  Monopril 40 Mg Tabs (Fosinopril sodium) .... Take 2 tablet by mouth twice a day 5)  Viagra 50 Mg Tabs (Sildenafil citrate) .... Take 1/2 tablet by mouth as directed 6)  Catapres 0.2 Mg Tabs (Clonidine hcl) .... 2 in am, 2 in pm 7)  Furosemide 20 Mg Tabs (Furosemide) .... Take one tab twice daily 8)  Synthroid 100 Mcg Tabs (Levothyroxine sodium) .... Once daily 9)  Ascriptin 325 Mg Tabs (Aspirin buf(alhyd-mghyd-cacar)) .... Once daily 10)  Crestor 40 Mg Tabs (Rosuvastatin calcium) .Marland Kitchen.. 1 tab @ bedtime 11)  Doxazosin Mesylate 8 Mg Tabs (Doxazosin mesylate) .... Take one tab once daily 12)  Labetalol Hcl 200 Mg Tabs (Labetalol hcl) .Marland KitchenMarland KitchenMarland Kitchen  2 in am, 1 in pm  Patient Instructions: 1)  increase the labetalol by taking two tabs in the morning and one pill at bedtime.  If in two weeks,if  your blood pressure is not normal, then increase the labetalol to two tablets twice a day.  Return in 4 weeks for follow-up  Prescriptions: LABETALOL HCL 200 MG TABS (LABETALOL HCL) 2 in am, 1 in pm  #300 x 3   Entered and Authorized by:   Roderick Pee MD   Signed by:   Roderick Pee MD on 08/25/2009   Method used:    [facility]        [facility]  [address] [phone]* (retail)       [address]       [place], [place]  [address]       Ph: [phone] or [phone]       Fax: [phone]   RxID:   [id number]

## 2010-09-24 NOTE — Progress Notes (Signed)
Summary: generic  Phone Note Call from Patient Call back at Home Phone [phone]   Caller: vm Summary of Call: Lipitor needs to be generic.  [facility] [address]  Gave Dr. Tawanna Cooler wrong info Initial call taken by: Rudy Jew, RN,  Jan 07, 2010 2:10 PM  Follow-up for Phone Call        there is no generic Lipitor Follow-up by: Roderick Pee MD,  Jan 08, 2010 8:41 AM  Additional Follow-up for Phone Call Additional follow up Details #1::        left message on machine for patient  Additional Follow-up by: Kern Reap CMA Duncan Dull),  Jan 08, 2010 10:01 AM    New/Updated Medications: SIMVASTATIN 80 MG TABS (SIMVASTATIN) take one tab by mouth at bedtime Prescriptions: SIMVASTATIN 80 MG TABS (SIMVASTATIN) take one tab by mouth at bedtime  #90 x 3   Entered by:   Kern Reap CMA (AAMA)   Authorized by:   Roderick Pee MD   Signed by:   Kern Reap CMA (AAMA) on 01/14/2010   Method used:   [facility]        [facility]  [address] [phone]* (retail)       [address]       [place], [place]  [id number]       Ph: [phone] or [phone]       Fax: [phone]   RxID:   [id number]

## 2010-09-24 NOTE — Progress Notes (Signed)
Summary: rx clarification  Phone Note From Pharmacy   [facility]  [address] [phone]* Summary of Call: pharmacy clarification Initial call taken by: Kern Reap CMA Duncan Dull),  September 10, 2010 4:21 PM    New/Updated Medications: METFORMIN HCL 1000 MG TABS (METFORMIN HCL) take one tab before breakfast and half tab before evening meal Prescriptions: METFORMIN HCL 1000 MG TABS (METFORMIN HCL) take one tab before breakfast and half tab before evening meal  #150 x 3   Entered by:   Kern Reap CMA (AAMA)   Authorized by:   Roderick Pee MD   Signed by:   Kern Reap CMA (AAMA) on 09/10/2010   Method used:   [facility]        [facility]  [address] [phone]* (retail)       [address]       [place], [place]  [id number]       Ph: [phone] or [phone]       Fax: [phone]   RxID:   [id number]

## 2010-09-24 NOTE — Assessment & Plan Note (Signed)
Summary: CPX//SLM   Vital Signs:  Patient profile:   65 year old male Height:      68 inches Weight:      233 pounds Temp:     98.1 degrees F oral BP sitting:   140 / 90  (left arm) Cuff size:   regular  Vitals Entered By: Kern Reap CMA Duncan Dull) (Jan 01, 2010 9:54 AM) CC: cpx   CC:  cpx.  History of Present Illness: Matthew Serrano is a 65 year old male, nonsmoker, who comes in today for evaluation of metabolic syndrome with hypertension, diabetes, hyperlipidemia.  His diabetes is treated with Glucophage 500 mg b.i.d., Glucotrol 10 mg b.i.d. fasting blood sugar here was 129 with an A1c of 6.8.  Hypertension.  History of Monopril 40 mg b.i.d., Catapres .2, does two tabs in the morning and two tabs in the p.m., potassium 20 mEq two tabs daily, Lasix 20 mg b.i.d., labetalol 400 mg b.i.d.Marland Kitchen  Blood pressure 140/90.  He stopped taking his Crestor.  It was too expensive.  The Lipitor.  His lipids are not at, goal.  That's why we switched him to the Crestor.  However, because of cost issues.  Will go back on Lipitor.  He also uses fiber p.r.n. for ED.  He takes Synthroid 100 micrograms daily for hypothyroidism.  TSH level is 6.6.  Three.  Will increase Synthroid 250 micrograms daily.  He gets routine eye care..... February 2011 Dr. Emily Filbert.  Normal.  Routine dental care.  Colonoscopy done in GI showed some polyps.  He stood for recall to 5 years.  Tetanus 2008 seasonal flu 2010 Pneumovax 2006.  He recently had some constipation and a strained ago and noticed some bright red rectal bleeding.  Allergies: No Known Drug Allergies  Past History:  Past medical, surgical, family and social histories (including risk factors) reviewed, and no changes noted (except as noted below).  Past Medical History: Reviewed history from 12/14/2007 and no changes required. Diabetes mellitus, type II Hyperlipidemia Hypertension Hypothyroidism erectile dysfunction  Past Surgical History: Reviewed history  from 12/14/2007 and no changes required. Denies surgical history  Family History: Reviewed history from 07/06/2007 and no changes required. Family History Hypertension  Social History: Reviewed history from 07/06/2007 and no changes required. Single Current Smoker Alcohol use-no Drug use-no Regular exercise-no  Review of Systems      See HPI  Physical Exam  General:  Well-developed,well-nourished,in no acute distress; alert,appropriate and cooperative throughout examination Head:  Normocephalic and atraumatic without obvious abnormalities. No apparent alopecia or balding. Eyes:  No corneal or conjunctival inflammation noted. EOMI. Perrla. Funduscopic exam benign, without hemorrhages, exudates or papilledema. Vision grossly normal. Ears:  External ear exam shows no significant lesions or deformities.  Otoscopic examination reveals clear canals, tympanic membranes are intact bilaterally without bulging, retraction, inflammation or discharge. Hearing is grossly normal bilaterally. Nose:  External nasal examination shows no deformity or inflammation. Nasal mucosa are pink and moist without lesions or exudates. Mouth:  Oral mucosa and oropharynx without lesions or exudates.  Teeth in good repair. Neck:  No deformities, masses, or tenderness noted. Chest Wall:  No deformities, masses, tenderness or gynecomastia noted. Breasts:  No masses or gynecomastia noted Lungs:  Normal respiratory effort, chest expands symmetrically. Lungs are clear to auscultation, no crackles or wheezes. Heart:  Normal rate and regular rhythm. S1 and S2 normal without gallop, murmur, click, rub or other extra sounds. Abdomen:  Bowel sounds positive,abdomen soft and non-tender without masses, organomegaly or hernias noted. Rectal:  No external abnormalities noted. Normal sphincter tone. No rectal masses or tenderness....Marland KitchenMarland KitchenBrown stool, guaiac-positive Genitalia:  Testes bilaterally descended without nodularity,  tenderness or masses. No scrotal masses or lesions. No penis lesions or urethral discharge. Prostate:  Prostate gland firm and smooth, no enlargement, nodularity, tenderness, mass, asymmetry or induration. Msk:  No deformity or scoliosis noted of thoracic or lumbar spine.   Pulses:  R and L carotid,radial,femoral,dorsalis pedis and posterior tibial pulses are full and equal bilaterally Extremities:  No clubbing, cyanosis, edema, or deformity noted with normal full range of motion of all joints.   Neurologic:  No cranial nerve deficits noted. Station and gait are normal. Plantar reflexes are down-going bilaterally. DTRs are symmetrical throughout. Sensory, motor and coordinative functions appear intact. Skin:  Intact without suspicious lesions or rashes Cervical Nodes:  No lymphadenopathy noted Axillary Nodes:  No palpable lymphadenopathy Inguinal Nodes:  No significant adenopathy Psych:  Cognition and judgment appear intact. Alert and cooperative with normal attention span and concentration. No apparent delusions, illusions, hallucinations  Diabetes Management Exam:    Foot Exam (with socks and/or shoes not present):       Sensory-Pinprick/Light touch:          Left medial foot (L-4): normal          Left dorsal foot (L-5): normal          Left lateral foot (S-1): normal          Right medial foot (L-4): normal          Right dorsal foot (L-5): normal          Right lateral foot (S-1): normal       Sensory-Monofilament:          Left foot: normal          Right foot: normal       Inspection:          Left foot: normal          Right foot: normal       Nails:          Left foot: normal          Right foot: normal    Eye Exam:       Eye Exam done elsewhere          Date: 10/06/2009          Results: normal          Done by: Emily Filbert   Impression & Recommendations:  Problem # 1:  HEMORRHAGE OF RECTUM AND ANUS (ICD-569.3) Assessment Deteriorated  Problem # 2:  WELL ADULT EXAM  (ICD-V70.0) Assessment: Unchanged  Orders: Prescription Created Electronically [phone]) EKG w/ Interpretation (93000)  Problem # 3:  ERECTILE DYSFUNCTION, MILD (ICD-302.72) Assessment: Improved  His updated medication list for this problem includes:    Viagra 50 Mg Tabs (Sildenafil citrate) .Marland Kitchen... Take 1/2 tablet by mouth as directed  Orders: Prescription Created Electronically [phone])  Problem # 4:  HYPOTHYROIDISM (ICD-244.9) Assessment: Deteriorated  The following medications were removed from the medication list:    Synthroid 100 Mcg Tabs (Levothyroxine sodium) ..... Once daily His updated medication list for this problem includes:    Synthroid 150 Mcg Tabs (Levothyroxine sodium) .Marland Kitchen... Take 1 tablet by mouth every morning  Orders: Prescription Created Electronically [phone])  Problem # 5:  HYPERTENSION (ICD-401.9) Assessment: Unchanged  His updated medication list for this problem includes:    Monopril 40 Mg Tabs (Fosinopril sodium) .Marland Kitchen... Take 2  tablet by mouth twice a day    Catapres 0.2 Mg Tabs (Clonidine hcl) .Marland Kitchen... 2 in am, 2 in pm    Furosemide 20 Mg Tabs (Furosemide) .Marland Kitchen... Take one tab twice daily    Doxazosin Mesylate 8 Mg Tabs (Doxazosin mesylate) .Marland Kitchen... Take one tab once daily    Labetalol Hcl 200 Mg Tabs (Labetalol hcl) .Marland Kitchen... 2 in am, 2 in pm  Orders: Prescription Created Electronically [phone]) EKG w/ Interpretation (93000)  Problem # 6:  DIABETES MELLITUS, TYPE II (ICD-250.00) Assessment: Improved  His updated medication list for this problem includes:    Glucophage 500 Mg Tabs (Metformin hcl) .Marland Kitchen... Take 1 tablet by mouth twice a day    Glucotrol Xl 10 Mg Tb24 (Glipizide) .Marland Kitchen... Take 1 tablet by mouth twice a day    Monopril 40 Mg Tabs (Fosinopril sodium) .Marland Kitchen... Take 2 tablet by mouth twice a day    Ascriptin 325 Mg Tabs (Aspirin buf(alhyd-mghyd-cacar)) ..... Once daily  Orders: Prescription Created Electronically [phone])  Complete Medication List: 1)   Glucophage 500 Mg Tabs (Metformin hcl) .... Take 1 tablet by mouth twice a day 2)  Glucotrol Xl 10 Mg Tb24 (Glipizide) .... Take 1 tablet by mouth twice a day 3)  Klor-con M20 20 Meq Tbcr (Potassium chloride crys cr) .... 2 by mouth daily 4)  Monopril 40 Mg Tabs (Fosinopril sodium) .... Take 2 tablet by mouth twice a day 5)  Viagra 50 Mg Tabs (Sildenafil citrate) .... Take 1/2 tablet by mouth as directed 6)  Catapres 0.2 Mg Tabs (Clonidine hcl) .... 2 in am, 2 in pm 7)  Furosemide 20 Mg Tabs (Furosemide) .... Take one tab twice daily 8)  Ascriptin 325 Mg Tabs (Aspirin buf(alhyd-mghyd-cacar)) .... Once daily 9)  Doxazosin Mesylate 8 Mg Tabs (Doxazosin mesylate) .... Take one tab once daily 10)  Labetalol Hcl 200 Mg Tabs (Labetalol hcl) .... 2 in am, 2 in pm 11)  Lipitor 80 Mg Tabs (Atorvastatin calcium) .Marland Kitchen.. 1 tab @ bedtime 12)  Synthroid 150 Mcg Tabs (Levothyroxine sodium) .... Take 1 tablet by mouth every morning  Patient Instructions: 1)  take a stool softener daily to prevent constipation, and the rectal bleeding.  If the bleeding persists, follow-up with GI. 2)  Continue current medications. 3)  Restart Lipitor 80 mg take one half tablet Monday, Wednesday, Friday. 4)  Please schedule a follow-up appointment in 2 months.   272.00,250.00 5)  BMP prior to visit, ICD-9: 6)  Hepatic Panel prior to visit, ICD-9: 7)  Lipid Panel prior to visit, ICD-9: 8)  HbgA1C prior to visit, ICD-9: Prescriptions: SYNTHROID 150 MCG TABS (LEVOTHYROXINE SODIUM) Take 1 tablet by mouth every morning  #100 x 3   Entered and Authorized by:   Roderick Pee MD   Signed by:   Roderick Pee MD on 01/01/2010   Method used:   Electronically to        [facility] [address] [phone]* (retail)       [address]       [place], [place]  [address]       Ph: [phone] or [phone]       Fax: [phone]   RxID:   [id number] LABETALOL HCL 200 MG TABS (LABETALOL HCL) 2 in am, 2 in  pm  #400 x 3   Entered and Authorized by:   Roderick Pee MD   Signed by:   Roderick Pee  MD on 01/01/2010   Method used:   [facility]        [facility]  [address] [phone]* (retail)       [address]       [place], [place]  [address]       Ph: [phone] or [phone]       Fax: [phone]   RxID:   [id number] DOXAZOSIN MESYLATE 8 MG TABS (DOXAZOSIN MESYLATE) take one tab once daily  #100 x 3   Entered and Authorized by:   Roderick Pee MD   Signed by:   Roderick Pee MD on 01/01/2010   Method used:   [facility] to        [facility] [address] [phone]* (retail)       [address]       [place], [place]  [address]       Ph: [phone] or [phone]       Fax: [phone]   RxID:   [id number] FUROSEMIDE 20 MG  TABS (FUROSEMIDE) take one tab twice daily  #200 x 3   Entered and Authorized by:   Roderick Pee MD   Signed by:   Roderick Pee MD on 01/01/2010   Method used:   [facility]ronically to        [facility] [phone]* (retail)       [address]       [place], [place]  [address]       Ph: [phone] or [phone]       Fax: [phone]   RxID:   [id number] CATAPRES 0.2 MG  TABS (CLONIDINE HCL) 2 in am, 2 in pm  #400 x 3   Entered and Authorized by:   Roderick Pee MD   Signed by:   Roderick Pee MD on 01/01/2010   Method used:   [facility]ronically to        [facility] [phone]* (retail)       [address]       [place], [place]  [address]       Ph: [phone] or [phone]       Fax: [phone]   RxID:   [id number] VIAGRA 50 MG TABS (SILDENAFIL CITRATE) Take 1/2 tablet by mouth as directed  #6 x 11   Entered and Authorized by:   Roderick Pee MD   Signed by:   Roderick Pee MD on 01/01/2010   Method used:   [facility]ronically to        [facility] [phone]* (retail)       [address]       [place], [place]  [address]       Ph: [phone] or [phone]       Fax: [phone]   RxID:   [id number] MONOPRIL 40 MG TABS (FOSINOPRIL SODIUM) Take 2 tablet by mouth twice a day  #200 Tablet x 3   Entered and Authorized by:   Roderick Pee MD   Signed by:   Roderick Pee MD on 01/01/2010   Method used:   [facility]ronically to        [facility] [phone]* (retail)  [address]       [place], [place]  [address]       Ph: [phone] or [phone]       Fax: [phone]   RxID:   [id number] KLOR-CON M20 20 MEQ TBCR (POTASSIUM CHLORIDE CRYS CR) 2 by mouth daily  #200 x 3   Entered and Authorized by:   Roderick Pee MD   Signed by:   Roderick Pee MD on 01/01/2010   Method used:   Electronically to        [facility] [phone]* (retail)       [address]       [place], [place]  [address]       Ph: [phone] or [phone]       Fax: [phone]   RxID:   [id number] GLUCOTROL XL 10 MG TB24 (GLIPIZIDE) Take 1 tablet by mouth twice a day  #200 Tablet x 3   Entered and Authorized by:   Roderick Pee MD   Signed by:   Roderick Pee MD on 01/01/2010   Method used:   [facility]ronically to        [facility] [phone]* (retail)       [address]       [place], [place]  [address]       Ph: [phone] or [phone]       Fax: [phone]   RxID:   [id number] GLUCOPHAGE 500 MG TABS (METFORMIN HCL) Take 1 tablet by mouth twice a day  #200 x 3   Entered and Authorized by:   Roderick Pee MD   Signed by:   Roderick Pee MD on 01/01/2010   Method used:   Electronically to        [facility] [address] [phone]* (retail)       [address]       [place], [place]  [address]       Ph: [phone] or [phone]       Fax: [phone]   RxID:   [id number] LIPITOR 80 MG TABS  (ATORVASTATIN CALCIUM) 1 tab @ bedtime  #100 x 3   Entered and Authorized by:   Roderick Pee MD   Signed by:   Roderick Pee MD on 01/01/2010   Method used:   [facility]ronically to        [facility] [phone]* (retail)       [address]       [place], [place]  [address]       Ph: [phone] or [phone]       Fax: [phone]   RxID:   [id number]

## 2010-09-24 NOTE — Assessment & Plan Note (Signed)
Summary: 2 month rov/njr/pt rescd from bump//ccm   Vital Signs:  Patient profile:   65 year old male Weight:      234 pounds Temp:     98.1 degrees F oral BP sitting:   160 / 100  (left arm) Cuff size:   regular  Vitals Entered By: Kern Reap CMA Duncan Dull) (March 05, 2010 9:10 AM) CC: follow-up visit   CC:  follow-up visit.  History of Present Illness: Matthew Serrano is a 65 year old, married male, nonsmoker comes in today for follow-up of hypertension and diabetes.  His blood sugar at home ranges from 80 to 120.  We get 123, with a hemoglobin A1c of 6.9%.  BP is still running high at home.  He gets 140/85 to 90.  Pulse 60 and regular.  Therefore, will increase the Lasix because he also has a little bit of peripheral edema.  He is not walking daily  Allergies (verified): No Known Drug Allergies  Review of Systems      See HPI  Physical Exam  General:  Well-developed,well-nourished,in no acute distress; alert,appropriate and cooperative throughout examination Heart:  160 over hundred Extremities:  1+ left pedal edema and 1+ right pedal edema.     Impression & Recommendations:  Problem # 1:  HYPERTENSION (ICD-401.9) Assessment Unchanged  The following medications were removed from the medication list:    Furosemide 20 Mg Tabs (Furosemide) .Marland Kitchen... Take one tab twice daily His updated medication list for this problem includes:    Monopril 40 Mg Tabs (Fosinopril sodium) .Marland Kitchen... Take 2 tablet by mouth twice a day    Catapres 0.2 Mg Tabs (Clonidine hcl) .Marland Kitchen... 2 in am, 2 in pm    Doxazosin Mesylate 8 Mg Tabs (Doxazosin mesylate) .Marland Kitchen... Take one tab once daily    Labetalol Hcl 200 Mg Tabs (Labetalol hcl) .Marland Kitchen... 2 in am, 2 in pm    Furosemide 40 Mg Tabs (Furosemide) .Marland Kitchen... Take 1 tablet by mouth every morning  Orders: Prescription Created Electronically [phone])  Problem # 2:  DIABETES MELLITUS, TYPE II (ICD-250.00) Assessment: Improved  His updated medication list for this problem  includes:    Glucophage 500 Mg Tabs (Metformin hcl) .Marland Kitchen... Take 1 tablet by mouth twice a day    Glucotrol Xl 10 Mg Tb24 (Glipizide) .Marland Kitchen... Take 1 tablet by mouth twice a day    Monopril 40 Mg Tabs (Fosinopril sodium) .Marland Kitchen... Take 2 tablet by mouth twice a day    Ascriptin 325 Mg Tabs (Aspirin buf(alhyd-mghyd-cacar)) ..... Once daily  Orders: Prescription Created Electronically [phone])  Complete Medication List: 1)  Glucophage 500 Mg Tabs (Metformin hcl) .... Take 1 tablet by mouth twice a day 2)  Glucotrol Xl 10 Mg Tb24 (Glipizide) .... Take 1 tablet by mouth twice a day 3)  Klor-con M20 20 Meq Tbcr (Potassium chloride crys cr) .... 2 by mouth daily 4)  Monopril 40 Mg Tabs (Fosinopril sodium) .... Take 2 tablet by mouth twice a day 5)  Viagra 50 Mg Tabs (Sildenafil citrate) .... Take 1/2 tablet by mouth as directed 6)  Catapres 0.2 Mg Tabs (Clonidine hcl) .... 2 in am, 2 in pm 7)  Ascriptin 325 Mg Tabs (Aspirin buf(alhyd-mghyd-cacar)) .... Once daily 8)  Doxazosin Mesylate 8 Mg Tabs (Doxazosin mesylate) .... Take one tab once daily 9)  Labetalol Hcl 200 Mg Tabs (Labetalol hcl) .... 2 in am, 2 in pm 10)  Synthroid 150 Mcg Tabs (Levothyroxine sodium) .... Take 1 tablet by mouth every morning 11)  Simvastatin  80 Mg Tabs (Simvastatin) .... Take one tab by mouth at bedtime 12)  Furosemide 40 Mg Tabs (Furosemide) .... Take 1 tablet by mouth every morning  Patient Instructions: 1)  continue your current treatment program for your diabetes, but walk 20 minutes daily. 2)  Increase the Lasix to 40 mg daily.  Return in one month for follow-up with all your blood pressure readings and the device Prescriptions: FUROSEMIDE 40 MG TABS (FUROSEMIDE) Take 1 tablet by mouth every morning  #100 x 3   Entered and Authorized by:   Roderick Pee MD   Signed by:   Roderick Pee MD on 03/05/2010   Method used:   [facility]        [facility]  [address] [phone]* (retail)       [address]        [place], [place]  [address]       Ph: [phone] or [phone]       Fax: [phone]   RxID:   [id number]

## 2010-09-24 NOTE — Assessment & Plan Note (Signed)
Summary: 3 WK ROV/NJR pt rsc/njr   Vital Signs:  Patient profile:   65 year old male Weight:      236 pounds Temp:     98.5 degrees F oral Pulse rate:   76 / minute Pulse rhythm:   regular Resp:     16 per minute BP sitting:   124 / 66  Vitals Entered By: Lynann Beaver CMA (June 04, 2010 8:46 AM)  History of Present Illness: Matthew Serrano is a 65 year old male, who comes in today for reevaluation of hypertension.  Three weeks ago we increased his Lasix from 40 mg daily to 80 mg daily because his blood pressure was elevated.  His blood pressure today is 124/66 however his blood pressure at before.  He took his medication at 6 a.m. was 160 over hundred.  We will splint his ACE inhibitor instead of taking two in the morning take one twice a day  Current Medications (verified): 1)  Glucophage 500 Mg Tabs (Metformin Hcl) .... Take 1 Tablet By Mouth Twice A Day 2)  Glucotrol Xl 10 Mg Tb24 (Glipizide) .... Take 1 Tablet By Mouth Twice A Day 3)  Klor-Con M20 20 Meq Tbcr (Potassium Chloride Crys Cr) .... 2 By Mouth Daily 4)  Monopril 40 Mg Tabs (Fosinopril Sodium) .... Take 2 Tablet By Mouth Twice A Day 5)  Viagra 50 Mg Tabs (Sildenafil Citrate) .... Take 1/2 Tablet By Mouth As Directed 6)  Catapres 0.2 Mg  Tabs (Clonidine Hcl) .... 2 in Am, 2 in Pm 7)  Ascriptin 325 Mg  Tabs (Aspirin Buf(Alhyd-Mghyd-Cacar)) .... Once Daily 8)  Doxazosin Mesylate 8 Mg Tabs (Doxazosin Mesylate) .... Take One Tab Once Daily 9)  Labetalol Hcl 200 Mg Tabs (Labetalol Hcl) .... 3  in Am, 2 in Pm 10)  Synthroid 150 Mcg Tabs (Levothyroxine Sodium) .... Take 1 Tablet By Mouth Every Morning 11)  Simvastatin 80 Mg Tabs (Simvastatin) .... Take One Tab By Mouth At Bedtime 12)  Furosemide 40 Mg Tabs (Furosemide) .... Take 1 Tablet By Mouth Bid  Allergies (verified): No Known Drug Allergies  Past History:  Past medical, surgical, family and social histories (including risk factors) reviewed for relevance to current acute  and chronic problems.  Past Medical History: Reviewed history from 12/14/2007 and no changes required. Diabetes mellitus, type II Hyperlipidemia Hypertension Hypothyroidism erectile dysfunction  Past Surgical History: Reviewed history from 12/14/2007 and no changes required. Denies surgical history  Family History: Reviewed history from 07/06/2007 and no changes required. Family History Hypertension  Social History: Reviewed history from 07/06/2007 and no changes required. Single Current Smoker Alcohol use-no Drug use-no Regular exercise-no  Review of Systems      See HPI  Physical Exam  General:  Well-developed,well-nourished,in no acute distress; alert,appropriate and cooperative throughout examination   Impression & Recommendations:  Problem # 1:  HYPERTENSION (ICD-401.9) Assessment Improved  His updated medication list for this problem includes:    Monopril 40 Mg Tabs (Fosinopril sodium) .Marland Kitchen... Take 2 tablet by mouth twice a day    Catapres 0.2 Mg Tabs (Clonidine hcl) .Marland Kitchen... 2 in am, 2 in pm    Doxazosin Mesylate 8 Mg Tabs (Doxazosin mesylate) .Marland Kitchen... Take one tab once daily    Labetalol Hcl 200 Mg Tabs (Labetalol hcl) .Marland KitchenMarland KitchenMarland KitchenMarland Kitchen 3  in am, 2 in pm    Furosemide 40 Mg Tabs (Furosemide) .Marland Kitchen... Take 1 tablet by mouth bid  Orders: Venipuncture (62952) TLB-BMP (Basic Metabolic Panel-BMET) (80048-METABOL) TLB-A1C / Hgb A1C (Glycohemoglobin) (83036-A1C) Specimen  Handling ([address])  Complete Medication List: 1)  Glucophage 500 Mg Tabs (Metformin hcl) .... Take 1 tablet by mouth twice a day 2)  Glucotrol Xl 10 Mg Tb24 (Glipizide) .... Take 1 tablet by mouth twice a day 3)  Klor-con M20 20 Meq Tbcr (Potassium chloride crys cr) .... 2 by mouth daily 4)  Monopril 40 Mg Tabs (Fosinopril sodium) .... Take 2 tablet by mouth twice a day 5)  Viagra 50 Mg Tabs (Sildenafil citrate) .... Take 1/2 tablet by mouth as directed 6)  Catapres 0.2 Mg Tabs (Clonidine hcl) .... 2 in am, 2 in  pm 7)  Ascriptin 325 Mg Tabs (Aspirin buf(alhyd-mghyd-cacar)) .... Once daily 8)  Doxazosin Mesylate 8 Mg Tabs (Doxazosin mesylate) .... Take one tab once daily 9)  Labetalol Hcl 200 Mg Tabs (Labetalol hcl) .... 3  in am, 2 in pm 10)  Synthroid 150 Mcg Tabs (Levothyroxine sodium) .... Take 1 tablet by mouth every morning 11)  Simvastatin 80 Mg Tabs (Simvastatin) .... Take one tab by mouth at bedtime 12)  Furosemide 40 Mg Tabs (Furosemide) .... Take 1 tablet by mouth bid  Patient Instructions: 1)  split the Monopril, take one in the morning, and one at bedtime. 2)  Check a blood sugar and blood pressure Monday, Wednesday, Friday 3)  I will call you with the report on your blood work 4)  Please schedule a follow-up appointment in 3 months.Marland Kitchen..250.00 5)  HbgA1C prior to visit, ICD-9: 6)  BMP prior to visit, ICD-9:

## 2010-09-24 NOTE — Assessment & Plan Note (Signed)
Summary: 3 MTH ROV // RS   Vital Signs:  Patient profile:   65 year old male Height:      68 inches Weight:      239 pounds BMI:     36.47 Temp:     98.3 degrees F oral BP sitting:   140 / 80  (left arm) Cuff size:   regular  Vitals Entered By: Kern Reap CMA Duncan Dull) (September 10, 2010 9:02 AM) CC: follow-up visit   CC:  follow-up visit.  History of Present Illness: Matthew Serrano is a 65 year old male, nonsmoker, who comes in today for follow-up of diabetes, type II.  He takes metformin 500 mg b.i.d., Glucotrol, 10 mg b.i.d., blood sugar at home was in the 110, 120 range.  Blood sugar here 118, A1c7.5.  His working day begins at 10:30 a.m. till 6:30 p.m.  He is not walking on a daily basis.  Review of systems otherwise negative except for he is due for his annual exam in  may  Allergies: No Known Drug Allergies  Past History:  Past medical, surgical, family and social histories (including risk factors) reviewed for relevance to current acute and chronic problems.  Past Medical History: Reviewed history from 12/14/2007 and no changes required. Diabetes mellitus, type II Hyperlipidemia Hypertension Hypothyroidism erectile dysfunction  Past Surgical History: Reviewed history from 12/14/2007 and no changes required. Denies surgical history  Family History: Reviewed history from 07/06/2007 and no changes required. Family History Hypertension  Social History: Reviewed history from 07/06/2007 and no changes required. Single Current Smoker Alcohol use-no Drug use-no Regular exercise-no  Review of Systems      See HPI  Physical Exam  General:  Well-developed,well-nourished,in no acute distress; alert,appropriate and cooperative throughout examination   Impression & Recommendations:  Problem # 1:  DIABETES MELLITUS, TYPE II (ICD-250.00) Assessment Deteriorated  His updated medication list for this problem includes:    Glucophage 500 Mg Tabs (Metformin hcl) .Marland Kitchen...  Take 1 tablet by mouth twice a day    Glucotrol Xl 10 Mg Tb24 (Glipizide) .Marland Kitchen... Take 1 tablet by mouth twice a day    Monopril 40 Mg Tabs (Fosinopril sodium) .Marland Kitchen... Take 2 tablet by mouth twice a day    Ascriptin 325 Mg Tabs (Aspirin buf(alhyd-mghyd-cacar)) ..... Once daily    Metformin Hcl 1000 Mg Tabs (Metformin hcl) .Marland Kitchen... 1 by mouth < bfk & 1/2 < pm meal  Orders: Prescription Created Electronically [id number])  Complete Medication List: 1)  Glucophage 500 Mg Tabs (Metformin hcl) .... Take 1 tablet by mouth twice a day 2)  Glucotrol Xl 10 Mg Tb24 (Glipizide) .... Take 1 tablet by mouth twice a day 3)  Klor-con M20 20 Meq Tbcr (Potassium chloride crys cr) .... 2 by mouth daily 4)  Monopril 40 Mg Tabs (Fosinopril sodium) .... Take 2 tablet by mouth twice a day 5)  Viagra 50 Mg Tabs (Sildenafil citrate) .... Take 1/2 tablet by mouth as directed 6)  Catapres 0.2 Mg Tabs (Clonidine hcl) .... 2 in am, 2 in pm 7)  Ascriptin 325 Mg Tabs (Aspirin buf(alhyd-mghyd-cacar)) .... Once daily 8)  Doxazosin Mesylate 8 Mg Tabs (Doxazosin mesylate) .... Take one tab once daily 9)  Labetalol Hcl 200 Mg Tabs (Labetalol hcl) .... 3  in am, 2 in pm 10)  Synthroid 150 Mcg Tabs (Levothyroxine sodium) .... Take 1 tablet by mouth every morning 11)  Simvastatin 80 Mg Tabs (Simvastatin) .... Take one tab by mouth at bedtime 12)  Furosemide 40 Mg Tabs (  Furosemide) .... Take 1 tablet by mouth bid 13)  Metformin Hcl 1000 Mg Tabs (Metformin hcl) .Marland Kitchen.. 1 by mouth < bfk & 1/2 < pm meal  Patient Instructions: 1)  increase the morning dose of metformin to 1000 mg and continue the 500-mg dose prior to evening meal. 2)  Walk 20 minutes daily. 3)  Continue to check a fasting blood sugar 3 times per week. 4)  Return in May for your annual exam..250.00 5)  BMP prior to visit, ICD-9: 6)  Hepatic Panel prior to visit, ICD-9: 7)  Lipid Panel prior to visit, ICD-9: 8)  TSH prior to visit, ICD-9: 9)  CBC w/ Diff prior to visit,  ICD-9: 10)  Urine-dip prior to visit, ICD-9: 11)  PSA prior to visit, ICD-9: 12)  HbgA1C prior to visit, ICD-9: 13)  Urine Microalbumin prior to visit, ICD-9: Prescriptions: METFORMIN HCL 1000 MG TABS (METFORMIN HCL) 1 by mouth < bfk & 1/2 < pm meal  #150 x 3   Entered and Authorized by:   Roderick Pee MD   Signed by:   Roderick Pee MD on 09/10/2010   Method used:   Electron[facility] to        [facility]  [address] [phone]* (retail)       [address]       [place], [place]  [id number]       Ph: [phone] or [phone]       Fax: [phone]   RxID:   [id number]    Orders Added: 1)  Prescription Created Electronically [[id number]] 2)  Est. Patient Level III [63875]

## 2010-09-24 NOTE — Assessment & Plan Note (Signed)
Summary: 1 month rov/njr   Vital Signs:  Patient profile:   65 year old male Weight:      230 pounds Temp:     98.2 degrees F oral BP sitting:   150 / 80  (left arm) Cuff size:   regular  Vitals Entered By: Kathrynn Speed CMA (April 02, 2010 8:42 AM) CC: One month rov on BP, src Is Patient Diabetic? Yes   CC:  One month rov on BP and src.  History of Present Illness: Matthew Serrano is a 65 year old male diabetic with underlying hypertension, who comes in today for follow-up of elevated blood pressure.  His current blood pressure medications are Monopril 80 mg q.a.m., Catapres, .4, b.i.d., potassium 40 mEq.  Daily, Cardura 8 mg nightly, labetalol 400 mg b.i.d., Lasix, 40 mg daily, BP 150/80, pulse 60 and regular.  BP after rest 150/80.  Same on his digital machine  Preventive Screening-Counseling & Management  Alcohol-Tobacco     Smoking Status: quit     Smoking Cessation Counseling: yes     Year Quit: 2003     Passive Smoke Exposure: no  Current Medications (verified): 1)  Glucophage 500 Mg Tabs (Metformin Hcl) .... Take 1 Tablet By Mouth Twice A Day 2)  Glucotrol Xl 10 Mg Tb24 (Glipizide) .... Take 1 Tablet By Mouth Twice A Day 3)  Klor-Con M20 20 Meq Tbcr (Potassium Chloride Crys Cr) .... 2 By Mouth Daily 4)  Monopril 40 Mg Tabs (Fosinopril Sodium) .... Take 2 Tablet By Mouth Twice A Day 5)  Viagra 50 Mg Tabs (Sildenafil Citrate) .... Take 1/2 Tablet By Mouth As Directed 6)  Catapres 0.2 Mg  Tabs (Clonidine Hcl) .... 2 in Am, 2 in Pm 7)  Ascriptin 325 Mg  Tabs (Aspirin Buf(Alhyd-Mghyd-Cacar)) .... Once Daily 8)  Doxazosin Mesylate 8 Mg Tabs (Doxazosin Mesylate) .... Take One Tab Once Daily 9)  Labetalol Hcl 200 Mg Tabs (Labetalol Hcl) .... 2 in Am, 2 in Pm 10)  Synthroid 150 Mcg Tabs (Levothyroxine Sodium) .... Take 1 Tablet By Mouth Every Morning 11)  Simvastatin 80 Mg Tabs (Simvastatin) .... Take One Tab By Mouth At Bedtime 12)  Furosemide 40 Mg Tabs (Furosemide) .... Take 1  Tablet By Mouth Every Morning  Allergies (verified): No Known Drug Allergies  Physical Exam  General:  Well-developed,well-nourished,in no acute distress; alert,appropriate and cooperative throughout examination Heart:  150/80 right arm sitting position pulse 60 and regular   Impression & Recommendations:  Problem # 1:  HYPERTENSION (ICD-401.9) Assessment Improved  His updated medication list for this problem includes:    Monopril 40 Mg Tabs (Fosinopril sodium) .Marland Kitchen... Take 2 tablet by mouth twice a day    Catapres 0.2 Mg Tabs (Clonidine hcl) .Marland Kitchen... 2 in am, 2 in pm    Doxazosin Mesylate 8 Mg Tabs (Doxazosin mesylate) .Marland Kitchen... Take one tab once daily    Labetalol Hcl 200 Mg Tabs (Labetalol hcl) .Marland KitchenMarland KitchenMarland KitchenMarland Kitchen 3  in am, 2 in pm    Furosemide 40 Mg Tabs (Furosemide) .Marland Kitchen... Take 1 tablet by mouth every morning  Orders: Prescription Created Electronically [id number])  Complete Medication List: 1)  Glucophage 500 Mg Tabs (Metformin hcl) .... Take 1 tablet by mouth twice a day 2)  Glucotrol Xl 10 Mg Tb24 (Glipizide) .... Take 1 tablet by mouth twice a day 3)  Klor-con M20 20 Meq Tbcr (Potassium chloride crys cr) .... 2 by mouth daily 4)  Monopril 40 Mg Tabs (Fosinopril sodium) .... Take 2 tablet by mouth twice  a day 5)  Viagra 50 Mg Tabs (Sildenafil citrate) .... Take 1/2 tablet by mouth as directed 6)  Catapres 0.2 Mg Tabs (Clonidine hcl) .... 2 in am, 2 in pm 7)  Ascriptin 325 Mg Tabs (Aspirin buf(alhyd-mghyd-cacar)) .... Once daily 8)  Doxazosin Mesylate 8 Mg Tabs (Doxazosin mesylate) .... Take one tab once daily 9)  Labetalol Hcl 200 Mg Tabs (Labetalol hcl) .... 3  in am, 2 in pm 10)  Synthroid 150 Mcg Tabs (Levothyroxine sodium) .... Take 1 tablet by mouth every morning 11)  Simvastatin 80 Mg Tabs (Simvastatin) .... Take one tab by mouth at bedtime 12)  Furosemide 40 Mg Tabs (Furosemide) .... Take 1 tablet by mouth every morning  Patient Instructions: 1)  increase the labetalol to 3 tablets in  the morning.  Continue two tablets at bedtime.  And continue all your other medications.  Check a morning blood pressure daily.  Return in 4 weeks for follow-up.  When you return bring a record of all your blood pressure readings and the device Prescriptions: LABETALOL HCL 200 MG TABS (LABETALOL HCL) 3  in am, 2 in pm  #500 x 3   Entered and Authorized by:   Roderick Pee MD   Signed by:   Roderick Pee MD on 04/02/2010   Method used:   [facility]        [facility]  [address] [phone]* (retail)       [address]       [place], [place]  [id number]       Ph: [phone] or [phone]       Fax: [phone]   RxID:   [id number]

## 2010-11-21 ENCOUNTER — Other Ambulatory Visit: Payer: Self-pay | Admitting: Family Medicine

## 2010-11-29 ENCOUNTER — Other Ambulatory Visit: Payer: Self-pay | Admitting: Family Medicine

## 2010-12-31 ENCOUNTER — Other Ambulatory Visit (INDEPENDENT_AMBULATORY_CARE_PROVIDER_SITE_OTHER): Payer: BC Managed Care – PPO | Admitting: Family Medicine

## 2010-12-31 DIAGNOSIS — Z0000 Encounter for general adult medical examination without abnormal findings: Secondary | ICD-10-CM

## 2010-12-31 DIAGNOSIS — Z13220 Encounter for screening for lipoid disorders: Secondary | ICD-10-CM

## 2010-12-31 DIAGNOSIS — E119 Type 2 diabetes mellitus without complications: Secondary | ICD-10-CM

## 2010-12-31 LAB — POCT URINALYSIS DIPSTICK
Leukocytes, UA: NEGATIVE
Protein, UA: NEGATIVE
Spec Grav, UA: 1.015
Urobilinogen, UA: 0.2

## 2010-12-31 LAB — HEMOGLOBIN A1C: Hgb A1c MFr Bld: 6.9 % — ABNORMAL HIGH (ref 4.6–6.5)

## 2010-12-31 LAB — BASIC METABOLIC PANEL
BUN: 17 mg/dL (ref 6–23)
Chloride: 103 mEq/L (ref 96–112)
Potassium: 4.4 mEq/L (ref 3.5–5.1)

## 2010-12-31 LAB — CBC WITH DIFFERENTIAL/PLATELET
Basophils Relative: 0.7 % (ref 0.0–3.0)
Eosinophils Relative: 2.8 % (ref 0.0–5.0)
HCT: 39.7 % (ref 39.0–52.0)
Lymphs Abs: 1.3 10*3/uL (ref 0.7–4.0)
MCV: 85.1 fl (ref 78.0–100.0)
Monocytes Absolute: 0.3 10*3/uL (ref 0.1–1.0)
RBC: 4.66 Mil/uL (ref 4.22–5.81)
WBC: 3.9 10*3/uL — ABNORMAL LOW (ref 4.5–10.5)

## 2010-12-31 LAB — LIPID PANEL
Cholesterol: 253 mg/dL — ABNORMAL HIGH (ref 0–200)
Triglycerides: 165 mg/dL — ABNORMAL HIGH (ref 0.0–149.0)

## 2010-12-31 LAB — LDL CHOLESTEROL, DIRECT: Direct LDL: 168.8 mg/dL

## 2010-12-31 LAB — HEPATIC FUNCTION PANEL
ALT: 29 U/L (ref 0–53)
Total Protein: 6.6 g/dL (ref 6.0–8.3)

## 2010-12-31 LAB — TSH: TSH: 4.65 u[IU]/mL (ref 0.35–5.50)

## 2011-01-07 ENCOUNTER — Encounter: Payer: Self-pay | Admitting: Family Medicine

## 2011-01-13 ENCOUNTER — Encounter: Payer: Self-pay | Admitting: Family Medicine

## 2011-01-14 ENCOUNTER — Encounter: Payer: Self-pay | Admitting: Family Medicine

## 2011-01-14 ENCOUNTER — Ambulatory Visit (INDEPENDENT_AMBULATORY_CARE_PROVIDER_SITE_OTHER): Payer: BC Managed Care – PPO | Admitting: Family Medicine

## 2011-01-14 VITALS — BP 140/90 | Temp 98.1°F | Ht 68.0 in | Wt 239.0 lb

## 2011-01-14 DIAGNOSIS — E039 Hypothyroidism, unspecified: Secondary | ICD-10-CM

## 2011-01-14 DIAGNOSIS — F528 Other sexual dysfunction not due to a substance or known physiological condition: Secondary | ICD-10-CM

## 2011-01-14 DIAGNOSIS — E785 Hyperlipidemia, unspecified: Secondary | ICD-10-CM

## 2011-01-14 DIAGNOSIS — I10 Essential (primary) hypertension: Secondary | ICD-10-CM

## 2011-01-14 DIAGNOSIS — E119 Type 2 diabetes mellitus without complications: Secondary | ICD-10-CM

## 2011-01-14 MED ORDER — LABETALOL HCL 200 MG PO TABS: 200.0000 mg | ORAL_TABLET | Freq: Two times a day (BID) | ORAL | Status: DC

## 2011-01-14 MED ORDER — DOXAZOSIN MESYLATE 8 MG PO TABS: 8.0000 mg | ORAL_TABLET | Freq: Every day | ORAL | Status: DC

## 2011-01-14 MED ORDER — FUROSEMIDE 40 MG PO TABS: 40.0000 mg | ORAL_TABLET | Freq: Every day | ORAL | Status: DC

## 2011-01-14 MED ORDER — FOSINOPRIL SODIUM 40 MG PO TABS: ORAL_TABLET | ORAL | Status: DC

## 2011-01-14 MED ORDER — METFORMIN HCL 1000 MG PO TABS: 1000.0000 mg | ORAL_TABLET | Freq: Two times a day (BID) | ORAL | Status: DC

## 2011-01-14 MED ORDER — CLONIDINE HCL 0.2 MG PO TABS: ORAL_TABLET | ORAL | Status: DC

## 2011-01-14 MED ORDER — SILDENAFIL CITRATE 50 MG PO TABS: 50.0000 mg | ORAL_TABLET | Freq: Every day | ORAL | Status: DC | PRN

## 2011-01-14 MED ORDER — LEVOTHYROXINE SODIUM 150 MCG PO TABS: 150.0000 ug | ORAL_TABLET | Freq: Every day | ORAL | Status: DC

## 2011-01-14 MED ORDER — ATORVASTATIN CALCIUM 40 MG PO TABS: 40.0000 mg | ORAL_TABLET | Freq: Every day | ORAL | Status: DC

## 2011-01-14 MED ORDER — GLIPIZIDE ER 10 MG PO TB24: 10.0000 mg | ORAL_TABLET | Freq: Two times a day (BID) | ORAL | Status: DC

## 2011-01-14 MED ORDER — POTASSIUM CHLORIDE CRYS ER 20 MEQ PO TBCR: 20.0000 meq | EXTENDED_RELEASE_TABLET | Freq: Two times a day (BID) | ORAL | Status: DC

## 2011-01-14 NOTE — Patient Instructions (Signed)
Continue your current medications.  Walk 15 minutes daily.  Follow-up in 3 months.  Labs one week prior.  Stop the simvastatin 80 mg and begin Lipitor 40 mg at bedtime

## 2011-01-14 NOTE — Progress Notes (Signed)
Subjective:    Patient ID: Matthew Serrano, male    DOB: [birth year], 65 y.o.   MRN: [id number]  HPI      Matthew Serrano is a delightful, 65 year old, married man nonsmoker, who comes in today for physical evaluation because of a history of hypertension, hyperlipidemia, diabetes, erectile dysfunction, hypothyroidism.  His hypertension is treated with Catapres, .4, in the morning and .4 in the afternoon, Cardura 8 mg daily, Monopril 80 mg daily, Lasix, 40 mg daily, potassium 20 mEq two tabs daily, Normodyne, 600  milligrams in the morning and 400  mg in the evening.    BP 140/90.  He takes Glucotrol 10 mg b.i.d., metformin 1000 mg in the morning, 500 mg prior to his evening meal.  Blood sugar 116, A1c6 .9%.  He is only walking every other day.  Recommended he walked daily.  I think his blood sugar then will drop back to normal.  He takes V  50 mg p.r.n. For erectile dysfunction.  He takes it.  Simvastatin 80 mg daily for hyperlipidemia.  Will switch to Lipitor 40  He also takes an adult aspirin tablet daily.  He gets routine eye care, dental care, colonoscopy showed a polyp in GI 6 years ago.  He states for follow-up.  Tetanus 2008.      Review of Systems  Constitutional: Negative.   HENT: Negative.   Eyes: Negative.   Respiratory: Negative.   Cardiovascular: Negative.   Gastrointestinal: Negative.   Genitourinary: Negative.   Musculoskeletal: Negative.   Skin: Negative.   Neurological: Negative.   Hematological: Negative.   Psychiatric/Behavioral: Negative.        Objective:   Physical Exam  Constitutional: He is oriented to person, place, and time. He appears well-developed and well-nourished.  HENT:  Head: Normocephalic and atraumatic.  Right Ear: External ear normal.  Left Ear: External ear normal.  Nose: Nose normal.  Mouth/Throat: Oropharynx is clear and moist.  Eyes: Conjunctivae and EOM are normal. Pupils are equal, round, and reactive to light.  Neck: Normal range of  motion. Neck supple. No JVD present. No tracheal deviation present. No thyromegaly present.  Cardiovascular: Normal rate, regular rhythm, normal heart sounds and intact distal pulses.  Exam reveals no gallop and no friction rub.   No murmur heard. Pulmonary/Chest: Effort normal and breath sounds normal. No stridor. No respiratory distress. He has no wheezes. He has no rales. He exhibits no tenderness.  Abdominal: Soft. Bowel sounds are normal. He exhibits no distension and no mass. There is no tenderness. There is no rebound and no guarding.  Genitourinary: Rectum normal, prostate normal and penis normal. Guaiac negative stool. No penile tenderness.  Musculoskeletal: Normal range of motion. He exhibits no edema and no tenderness.  Lymphadenopathy:    He has no cervical adenopathy.  Neurological: He is alert and oriented to person, place, and time. He has normal reflexes. No cranial nerve deficit. He exhibits normal muscle tone.  Skin: Skin is warm and dry. No rash noted. No erythema. No pallor.  Psychiatric: He has a normal mood and affect. His behavior is normal. Judgment and thought content normal.          Assessment & Plan:  Hypertension under good control with the above medications.  Continue medications.  Diabetes.  Continue above medications walk daily.  Follow-up A1c in 3 months.  Erectile dysfunction continue Viagra.  Hyperlipidemia.  Transition from Zocor 82, Lipitor 40 follow-up lipid panel in 3 months.  Hypothyroidism.  Continue Synthroid  150 mcg daily

## 2011-01-17 ENCOUNTER — Other Ambulatory Visit: Payer: Self-pay | Admitting: Family Medicine

## 2011-01-21 ENCOUNTER — Other Ambulatory Visit: Payer: Self-pay | Admitting: *Deleted

## 2011-01-21 MED ORDER — LABETALOL HCL 200 MG PO TABS: ORAL_TABLET | ORAL | Status: DC

## 2011-03-25 ENCOUNTER — Other Ambulatory Visit (INDEPENDENT_AMBULATORY_CARE_PROVIDER_SITE_OTHER): Payer: BC Managed Care – PPO

## 2011-03-25 DIAGNOSIS — E785 Hyperlipidemia, unspecified: Secondary | ICD-10-CM

## 2011-03-25 DIAGNOSIS — E119 Type 2 diabetes mellitus without complications: Secondary | ICD-10-CM

## 2011-03-25 LAB — HEPATIC FUNCTION PANEL
AST: 29 U/L (ref 0–37)
Albumin: 4.5 g/dL (ref 3.5–5.2)
Total Protein: 6.9 g/dL (ref 6.0–8.3)

## 2011-03-25 LAB — BASIC METABOLIC PANEL
BUN: 16 mg/dL (ref 6–23)
GFR: 101.54 mL/min (ref >60.00)
Glucose, Bld: 135 mg/dL — ABNORMAL HIGH (ref 70–99)
Potassium: 4.7 mEq/L (ref 3.5–5.1)

## 2011-03-25 LAB — LIPID PANEL
Cholesterol: 186 mg/dL (ref 0–200)
VLDL: 20.2 mg/dL (ref 0.0–40.0)

## 2011-04-01 ENCOUNTER — Ambulatory Visit (INDEPENDENT_AMBULATORY_CARE_PROVIDER_SITE_OTHER): Payer: BC Managed Care – PPO | Admitting: Family Medicine

## 2011-04-01 ENCOUNTER — Encounter: Payer: Self-pay | Admitting: Family Medicine

## 2011-04-01 DIAGNOSIS — E785 Hyperlipidemia, unspecified: Secondary | ICD-10-CM

## 2011-04-01 DIAGNOSIS — E119 Type 2 diabetes mellitus without complications: Secondary | ICD-10-CM

## 2011-04-01 NOTE — Progress Notes (Signed)
  Subjective:    Patient ID: MCGWIRE DASARO, male    DOB: 06-28-1946, 65 y.o.   MRN: [id number]  HPIWillie is a 65 year old, married male, nonsmoker, who comes in today for follow-up of diabetes, and hyperlipidemia.  He is currently taking metformin 1000 mg before breakfast and 500 mg prior to his evening meal in the Glucotrol 10 mg b.i.d. A1c has dropped to 6.6%.  It was 6.9%.  He states his blood sugars down because he is taking his medicine.  Daily.  He still has not gotten compliant with his diet and is not walking on a regular basis.  We switched him from Zocor 80 Lipitor 40 lipids are normal with an LDL of 101.  HDL is 65.  Liver functions normal.  No complaints of muscle or joint pain    Review of Systems General metabolic review of systems otherwise negative    Objective:   Physical Exam  Well-developed well-nourished man in no acute distress      Assessment & Plan:  Diabetes type 2, under improved control continue current medications add walking 20 minutes daily.  Follow-up in 3 months.  Hyperlipidemia approaching goal continue Lipitor 40 daily

## 2011-04-01 NOTE — Patient Instructions (Signed)
Continue your current medications.  Walk 20 minutes daily.  Follow-up in 3 months fasting labs one week prior

## 2011-06-01 ENCOUNTER — Other Ambulatory Visit: Payer: Self-pay | Admitting: Family Medicine

## 2011-07-01 ENCOUNTER — Other Ambulatory Visit (INDEPENDENT_AMBULATORY_CARE_PROVIDER_SITE_OTHER): Payer: BC Managed Care – PPO

## 2011-07-01 DIAGNOSIS — E119 Type 2 diabetes mellitus without complications: Secondary | ICD-10-CM

## 2011-07-01 LAB — BASIC METABOLIC PANEL
BUN: 15 mg/dL (ref 6–23)
Chloride: 106 mEq/L (ref 96–112)
GFR: 106.56 mL/min (ref >60.00)
Potassium: 4.3 mEq/L (ref 3.5–5.1)

## 2011-07-01 LAB — HEMOGLOBIN A1C: Hgb A1c MFr Bld: 7 % — ABNORMAL HIGH (ref 4.6–6.5)

## 2011-07-08 ENCOUNTER — Ambulatory Visit: Payer: BC Managed Care – PPO | Admitting: Family Medicine

## 2011-07-12 ENCOUNTER — Ambulatory Visit (INDEPENDENT_AMBULATORY_CARE_PROVIDER_SITE_OTHER): Payer: BC Managed Care – PPO | Admitting: Family Medicine

## 2011-07-12 ENCOUNTER — Encounter: Payer: Self-pay | Admitting: Family Medicine

## 2011-07-12 VITALS — BP 160/88 | Temp 98.9°F | Wt 241.0 lb

## 2011-07-12 DIAGNOSIS — E119 Type 2 diabetes mellitus without complications: Secondary | ICD-10-CM

## 2011-07-12 DIAGNOSIS — I10 Essential (primary) hypertension: Secondary | ICD-10-CM

## 2011-07-12 DIAGNOSIS — Z23 Encounter for immunization: Secondary | ICD-10-CM

## 2011-07-12 NOTE — Progress Notes (Signed)
  Subjective:    Patient ID: Matthew Serrano, male    DOB: [birth year], 65 y.o.   MRN: [id number]  HPI Matthew Serrano Is a 65 year old, married male, nonsmoker, who comes in today for follow-up of diabetes, type 2, and hypertension.  His fasting blood sugar was 129 and 1C7 .0% on Glucotrol 10 mg b.i.d. And metformin 1000 mg in the morning and 500 mg in the PM.  BP at home 140/75   Review of Systems General and metabolic review of systems otherwise negative    Objective:   Physical Exam Well-developed well-nourished, male in no acute distress       Assessment & Plan:  Diabetes type 2, approaching goal continue current therapy and walking program daily.  Hypertension.  BP at home 140/75.  Continue current medication.  CPX and may

## 2011-07-12 NOTE — Patient Instructions (Signed)
Continue your current medications  Remember to walk 15 minutes daily.  Follow-up in May for your annual exam..............Marland Kitchen  Labs one week prior

## 2011-08-28 ENCOUNTER — Other Ambulatory Visit: Payer: Self-pay | Admitting: Family Medicine

## 2011-10-23 ENCOUNTER — Other Ambulatory Visit: Payer: Self-pay | Admitting: Family Medicine

## 2011-12-14 ENCOUNTER — Other Ambulatory Visit: Payer: Self-pay | Admitting: Family Medicine

## 2012-01-13 ENCOUNTER — Other Ambulatory Visit (INDEPENDENT_AMBULATORY_CARE_PROVIDER_SITE_OTHER): Payer: BC Managed Care – PPO

## 2012-01-13 DIAGNOSIS — E119 Type 2 diabetes mellitus without complications: Secondary | ICD-10-CM

## 2012-01-13 LAB — HEPATIC FUNCTION PANEL
ALT: 33 U/L (ref 0–53)
AST: 26 U/L (ref 0–37)
Alkaline Phosphatase: 54 U/L (ref 39–117)
Bilirubin, Direct: 0.1 mg/dL (ref 0.0–0.3)
Total Bilirubin: 0.6 mg/dL (ref 0.3–1.2)
Total Protein: 6.4 g/dL (ref 6.0–8.3)

## 2012-01-13 LAB — BASIC METABOLIC PANEL
BUN: 16 mg/dL (ref 6–23)
Chloride: 104 mEq/L (ref 96–112)
GFR: 107.73 mL/min (ref >60.00)
Potassium: 4.7 mEq/L (ref 3.5–5.1)
Sodium: 142 mEq/L (ref 135–145)

## 2012-01-13 LAB — LIPID PANEL
Cholesterol: 158 mg/dL (ref 0–200)
VLDL: 21 mg/dL (ref 0.0–40.0)

## 2012-01-13 LAB — CBC WITH DIFFERENTIAL/PLATELET
Basophils Absolute: 0 10*3/uL (ref 0.0–0.1)
Basophils Relative: 0.6 % (ref 0.0–3.0)
HCT: 39.8 % (ref 39.0–52.0)
MCHC: 31.9 g/dL (ref 30.0–36.0)
MCV: 85.7 fl (ref 78.0–100.0)
Neutro Abs: 2 10*3/uL (ref 1.4–7.7)
Neutrophils Relative %: 48 % (ref 43.0–77.0)
Platelets: 168 10*3/uL (ref 150.0–400.0)
RDW: 15.3 % — ABNORMAL HIGH (ref 11.5–14.6)

## 2012-01-13 LAB — POCT URINALYSIS DIPSTICK
Bilirubin, UA: NEGATIVE
Glucose, UA: NEGATIVE
Nitrite, UA: NEGATIVE
Spec Grav, UA: 1.02

## 2012-01-13 LAB — MICROALBUMIN / CREATININE URINE RATIO
Creatinine,U: 132.2 mg/dL
Microalb Creat Ratio: 1 mg/g (ref 0.0–30.0)
Microalb, Ur: 1.3 mg/dL (ref 0.0–1.9)

## 2012-01-20 ENCOUNTER — Encounter: Payer: Self-pay | Admitting: Family Medicine

## 2012-01-20 ENCOUNTER — Other Ambulatory Visit: Payer: Self-pay | Admitting: Family Medicine

## 2012-01-20 ENCOUNTER — Ambulatory Visit (INDEPENDENT_AMBULATORY_CARE_PROVIDER_SITE_OTHER): Payer: BC Managed Care – PPO | Admitting: Family Medicine

## 2012-01-20 VITALS — BP 180/108 | Temp 97.9°F | Ht 68.5 in | Wt 236.0 lb

## 2012-01-20 DIAGNOSIS — I10 Essential (primary) hypertension: Secondary | ICD-10-CM

## 2012-01-20 DIAGNOSIS — F528 Other sexual dysfunction not due to a substance or known physiological condition: Secondary | ICD-10-CM

## 2012-01-20 DIAGNOSIS — E785 Hyperlipidemia, unspecified: Secondary | ICD-10-CM

## 2012-01-20 DIAGNOSIS — Z0000 Encounter for general adult medical examination without abnormal findings: Secondary | ICD-10-CM

## 2012-01-20 DIAGNOSIS — R972 Elevated prostate specific antigen [PSA]: Secondary | ICD-10-CM

## 2012-01-20 DIAGNOSIS — E119 Type 2 diabetes mellitus without complications: Secondary | ICD-10-CM

## 2012-01-20 DIAGNOSIS — E039 Hypothyroidism, unspecified: Secondary | ICD-10-CM

## 2012-01-20 HISTORY — DX: Elevated prostate specific antigen (PSA): R97.20

## 2012-01-20 MED ORDER — METFORMIN HCL 1000 MG PO TABS: ORAL_TABLET | ORAL | Status: DC

## 2012-01-20 MED ORDER — FUROSEMIDE 40 MG PO TABS: ORAL_TABLET | ORAL | Status: DC

## 2012-01-20 MED ORDER — CLONIDINE HCL 0.2 MG PO TABS: ORAL_TABLET | ORAL | Status: DC

## 2012-01-20 MED ORDER — LABETALOL HCL 200 MG PO TABS: ORAL_TABLET | ORAL | Status: DC

## 2012-01-20 MED ORDER — SILDENAFIL CITRATE 50 MG PO TABS: 50.0000 mg | ORAL_TABLET | Freq: Every day | ORAL | Status: DC | PRN

## 2012-01-20 MED ORDER — FOSINOPRIL SODIUM 40 MG PO TABS: ORAL_TABLET | ORAL | Status: DC

## 2012-01-20 MED ORDER — DOXAZOSIN MESYLATE 8 MG PO TABS: 8.0000 mg | ORAL_TABLET | Freq: Every day | ORAL | Status: DC

## 2012-01-20 MED ORDER — LEVOTHYROXINE SODIUM 150 MCG PO TABS: 150.0000 ug | ORAL_TABLET | Freq: Every day | ORAL | Status: DC

## 2012-01-20 MED ORDER — GLIPIZIDE ER 10 MG PO TB24: 10.0000 mg | ORAL_TABLET | Freq: Two times a day (BID) | ORAL | Status: DC

## 2012-01-20 MED ORDER — ATORVASTATIN CALCIUM 40 MG PO TABS: 40.0000 mg | ORAL_TABLET | Freq: Every day | ORAL | Status: DC

## 2012-01-20 NOTE — Patient Instructions (Addendum)
Increase the Lasix take 2 tablets daily in the morning  Labetalol 3 tablets in the morning and 3 tablets at bedtime  All your other medications stay the same  Check your blood pressure daily in the morning and return in 2 weeks for followup with the data and the device  Your PSA is slightly elevated recommend a followup in 6 months  We will also get you set up for renal ultrasound because your blood pressure is not controlled with 5 drugs

## 2012-01-20 NOTE — Progress Notes (Signed)
  Subjective:    Patient ID: Matthew Serrano, male    DOB: 03-Jan-1946, 66 y.o.   MRN: [id number]  HPI Matthew Serrano is a 66 year old married male nonsmoker who comes in today for evaluation of hypertension hyperlipidemia diabetes type 2 hypothyroidism and erectile dysfunction  His medication reviewed in detail and there've been no changes he's been compliant with all his medications however his blood pressure is 180/108 today he says his blood pressures have been elevated at home also.  Pneumovax x2 tetanus 2008    Review of Systems  Constitutional: Negative.   HENT: Negative.   Eyes: Negative.   Respiratory: Negative.   Cardiovascular: Negative.   Gastrointestinal: Negative.   Genitourinary: Negative.   Musculoskeletal: Negative.   Skin: Negative.   Neurological: Negative.   Hematological: Negative.   Psychiatric/Behavioral: Negative.        Objective:   Physical Exam  Constitutional: He is oriented to person, place, and time. He appears well-developed and well-nourished.  HENT:  Head: Normocephalic and atraumatic.  Right Ear: External ear normal.  Left Ear: External ear normal.  Nose: Nose normal.  Mouth/Throat: Oropharynx is clear and moist.  Eyes: Conjunctivae and EOM are normal. Pupils are equal, round, and reactive to light.  Neck: Normal range of motion. Neck supple. No JVD present. No tracheal deviation present. No thyromegaly present.  Cardiovascular: Normal rate, regular rhythm, normal heart sounds and intact distal pulses.  Exam reveals no gallop and no friction rub.   No murmur heard. Pulmonary/Chest: Effort normal and breath sounds normal. No stridor. No respiratory distress. He has no wheezes. He has no rales. He exhibits no tenderness.  Abdominal: Soft. Bowel sounds are normal. He exhibits no distension and no mass. There is no tenderness. There is no rebound and no guarding.  Genitourinary: Rectum normal, prostate normal and penis normal. Guaiac negative stool. No  penile tenderness.  Musculoskeletal: Normal range of motion. He exhibits no edema and no tenderness.  Lymphadenopathy:    He has no cervical adenopathy.  Neurological: He is alert and oriented to person, place, and time. He has normal reflexes. No cranial nerve deficit. He exhibits normal muscle tone.  Skin: Skin is warm and dry. No rash noted. No erythema. No pallor.  Psychiatric: He has a normal mood and affect. His behavior is normal. Judgment and thought content normal.          Assessment & Plan:  Hypertension,,,,,,,,, not at goal continue current meds but double Lasix to 40 mg twice a day BP check daily followup in one week  Diabetes type 2 at goal continue current therapy  Hyperlipidemia goal continue current therapy  Hypothyroidism continue current therapy  Erectile dysfunction continue current therapy

## 2012-02-03 ENCOUNTER — Ambulatory Visit (INDEPENDENT_AMBULATORY_CARE_PROVIDER_SITE_OTHER): Payer: BC Managed Care – PPO | Admitting: Family Medicine

## 2012-02-03 ENCOUNTER — Encounter: Payer: Self-pay | Admitting: Family Medicine

## 2012-02-03 VITALS — BP 178/100 | Temp 98.2°F | Wt 236.0 lb

## 2012-02-03 DIAGNOSIS — I10 Essential (primary) hypertension: Secondary | ICD-10-CM

## 2012-02-03 NOTE — Patient Instructions (Signed)
Continue your current medications  Followup 3-5 days after the ultrasound here

## 2012-02-03 NOTE — Progress Notes (Signed)
  Subjective:    Patient ID: Matthew Serrano, male    DOB: Dec 21, 1945, 66 y.o.   MRN: [id number]  HPI  Matthew Serrano is a 66 year old male who comes in today for followup of hypertension  We did his physical couple weeks ago his blood pressure was elevated. We increased his medication his blood pressure still elevated BP running 180/100 right arm sitting position on Catapres 0.4 twice a day, Cardura 8 mg each bedtime, Monopril 80 mg every morning, Lasix 80 mg every morning, and labetalol 600 mg twice a day we will set him up for a renal ultrasound to be sure he does not have renal artery stenosis since we've increased his medication and his blood pressure one drop back to normal   Review of Systems    general and cardiovascular review of systems otherwise negative Objective:   Physical Exam  Well-developed well-nourished male in no acute distress BP right arm sitting position 180/100 pulse 70 and regular      Assessment & Plan:  Hypertension not at goal with the above medication set up renal artery ultrasound rule out renal artery stenosis

## 2012-02-05 ENCOUNTER — Other Ambulatory Visit: Payer: Self-pay | Admitting: Family Medicine

## 2012-02-07 ENCOUNTER — Other Ambulatory Visit: Payer: Self-pay | Admitting: Family Medicine

## 2012-02-07 DIAGNOSIS — I10 Essential (primary) hypertension: Secondary | ICD-10-CM

## 2012-02-09 ENCOUNTER — Other Ambulatory Visit: Payer: Self-pay | Admitting: Cardiology

## 2012-02-09 DIAGNOSIS — I10 Essential (primary) hypertension: Secondary | ICD-10-CM

## 2012-02-10 ENCOUNTER — Encounter (INDEPENDENT_AMBULATORY_CARE_PROVIDER_SITE_OTHER): Payer: BC Managed Care – PPO

## 2012-02-10 DIAGNOSIS — I10 Essential (primary) hypertension: Secondary | ICD-10-CM

## 2012-02-15 ENCOUNTER — Ambulatory Visit (INDEPENDENT_AMBULATORY_CARE_PROVIDER_SITE_OTHER): Payer: BC Managed Care – PPO | Admitting: Family Medicine

## 2012-02-15 ENCOUNTER — Encounter: Payer: Self-pay | Admitting: Family Medicine

## 2012-02-15 VITALS — BP 160/90 | Temp 98.2°F | Wt 238.0 lb

## 2012-02-15 DIAGNOSIS — I10 Essential (primary) hypertension: Secondary | ICD-10-CM

## 2012-02-15 NOTE — Progress Notes (Signed)
  Subjective:    Patient ID: Matthew Serrano, male    DOB: [birth year], 66 y.o.   MRN: [id number]  HPI Matthew Serrano is a 66 year old male who comes back today for followup of uncontrolled hypertension  We sent him over for a renal Doppler the other day because of blood pressure on Catapres 0.4 twice a day, Cardura 8 mg each bedtime, Monopril 80 mg daily, Lasix 80 mg daily is not at goal he's also on Normodyne 600 mg twice a day  The scan shows normal appearing kidneys the ostia were not able to be visualized however it that looked to be patent mid distal right and proximal through distal left. This is still not sure the problem or question could there be a blockage. I would therefore set him up a consult to see Dr. Shirlee Latch for second opinion   Review of Systems General and cardiovascular review of systems negative    Objective:   Physical Exam Well-developed well-nourished male in acute distress BP today 160/90       Assessment & Plan:  Hypertension not at goal on 5 medications plan consult with Dr. Marca Ancona

## 2012-02-15 NOTE — Patient Instructions (Signed)
Continue your current medications  We will set you up a consult with Dr. Marca Ancona for further evaluation

## 2012-02-22 ENCOUNTER — Other Ambulatory Visit: Payer: Self-pay | Admitting: Family Medicine

## 2012-03-18 ENCOUNTER — Other Ambulatory Visit: Payer: Self-pay | Admitting: Family Medicine

## 2012-03-20 ENCOUNTER — Encounter: Payer: Self-pay | Admitting: *Deleted

## 2012-03-23 ENCOUNTER — Encounter: Payer: Self-pay | Admitting: Cardiology

## 2012-03-23 ENCOUNTER — Ambulatory Visit (INDEPENDENT_AMBULATORY_CARE_PROVIDER_SITE_OTHER): Payer: BC Managed Care – PPO | Admitting: Cardiology

## 2012-03-23 VITALS — BP 178/100 | HR 56 | Ht 68.5 in | Wt 236.0 lb

## 2012-03-23 DIAGNOSIS — I10 Essential (primary) hypertension: Secondary | ICD-10-CM

## 2012-03-23 DIAGNOSIS — E785 Hyperlipidemia, unspecified: Secondary | ICD-10-CM

## 2012-03-23 MED ORDER — NIFEDIPINE ER 60 MG PO TB24: 60.0000 mg | ORAL_TABLET | Freq: Every day | ORAL | Status: DC

## 2012-03-23 MED ORDER — ASPIRIN 81 MG PO TABS: 81.0000 mg | ORAL_TABLET | Freq: Every day | ORAL | Status: DC

## 2012-03-23 NOTE — Assessment & Plan Note (Signed)
Has had diagnosis of HTN for a long time, but not difficult to control until about 5-6 months ago.  He is on 5 agents now and BP is 178/100 today.  He reports compliance with all his medications.   - With worsening control of previously-controlled BP in a male in his 7s, renal artery stenosis is certainly a concern.  Renal artery dopplers did not show a definite stenosis but incompletely imaged the arteries.  I will arrange for an MRA to assess the renal arteries (creatinine was normal when last checked).   - I strongly suspect OSA, which can play a significant role in HTN.  I will arrange for a sleep study.  - We talked at length about dietary changes and increased exercise for weight loss as this can help his BP control.  He also needs to watch his sodium intake, which we also discussed.  - I am going to add nifedipine XL 60 mg daily.  I will titrate this up as needed.  Ideally, this will replace doxazosin but will continue all his other meds for now. He will check his BP daily with his home cuff. We will call him in 2 wks to see what his BP is running and will titrate up nifedipine XL at that time if needed.  - Echo to assess LV function and degree of LVH.

## 2012-03-23 NOTE — Patient Instructions (Addendum)
Your physician recommends that you schedule a follow-up appointment in: 1 month  Your physician has recommended that you have a sleep study. This test records several body functions during sleep, including: brain activity, eye movement, oxygen and carbon dioxide blood levels, heart rate and rhythm, breathing rate and rhythm, the flow of air through your mouth and nose, snoring, body muscle movements, and chest and belly movement.   Your physician has requested that you have an echocardiogram. Echocardiography is a painless test that uses sound waves to create images of your heart. It provides your doctor with information about the size and shape of your heart and how well your heart's chambers and valves are working. This procedure takes approximately one hour. There are no restrictions for this procedure.   Your physician has requested you have an MRA of your renal arteries.  Your physician has recommended you make the following change in your medication: Start Procardia XL 60 mg by mouth daily. Decrease aspirin to 81 mg by mouth daily                                                                                                                                             Check your blood pressure daily and keep record of readings.  Dr. Alford Highland nurse Thurston Hole) will call you in 2 weeks to get these readings.

## 2012-03-23 NOTE — Progress Notes (Signed)
Patient ID: Matthew Serrano, male   DOB: [birth year], 66 y.o.   MRN: [id number] PCP: Dr. Tawanna Cooler  66 yo presents for cardiology evaluation of difficult-to-control HTN.  Patient has been known to be hypertensive for years, but per his report, his BP has been out of control for the last 5-6 months.  Prior to that, he had it controlled on medications.  He is on 5 BP-active agents now, and BP is 178/100.   He is compliant with his medications.  He is overweight but has not gained much weight recently.  He does not watch the sodium in his diet.  He has good exercise tolerance and denies any exertional dyspnea or chest pain.  No syncope or headaches.  He does snore loudly and has been told that he sometimes stops breathing briefly in his sleep.  He has significant daytime sleepiness.  He had renal artery dopplers done in 6/13.  This was a technically difficult study and the ostia of the renal arteries were poorly visualized.   ECG: NSR, nonspecific lateral and anterolateral T wave flattening.   Labs (5/13): LDL 88, HDL 49, TSH 4, K 4.7, creatinine 0.9  PMH: 1. HTN: Poorly controlled for a number of months.  Renal artery doppler US (6/13) was technically difficult and did not image the ostia of the renal arteries though the mid to distal arteries had no significant stenosis.  2. DM2 3. Hyperlipidemia 4. Hypothyroidism 5. Erectile dysfunction  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   FH: Sisters with HTN.  No premature CAD.   ROS: All systems reviewed and negative except as per HPI.   Current Outpatient Prescriptions  Medication Sig Dispense Refill  . aspirin 81 MG tablet Take 1 tablet (81 mg total) by mouth daily.  30 tablet  0  . atorvastatin (LIPITOR) 40 MG tablet Take 1 tablet (40 mg total) by mouth daily.  100 tablet  3  . cloNIDine (CATAPRES) 0.2 MG tablet 2 in the AM and 2 in the PM  400 tablet  3  . doxazosin (CARDURA) 8 MG tablet Take 1 tablet (8 mg total) by  mouth at bedtime.  100 tablet  3  . fosinopril (MONOPRIL) 40 MG tablet TAKE 2 TABLETS BY MOUTH EVERY DAY  200 tablet  2  . furosemide (LASIX) 40 MG tablet 2 tabs in the morning  100 tablet  3  . glipiZIDE (GLUCOTROL XL) 10 MG 24 hr tablet TAKE 1 TABLET (10 MG TOTAL) BY MOUTH 2 (TWO) TIMES DAILY.  200 tablet  3  . KLOR-CON M20 20 MEQ tablet TAKE 1 TABLET BY MOUTH TWICE A DAY  200 tablet  2  . labetalol (NORMODYNE) 200 MG tablet 3 tablets in the morning and 3 tablets at bedtime  600 tablet  3  . levothyroxine (SYNTHROID, LEVOTHROID) 150 MCG tablet TAKE 1 TABLET (150 MCG TOTAL) BY MOUTH DAILY.  100 tablet  3  . metFORMIN (GLUCOPHAGE) 1000 MG tablet 1 tablet before breakfast and a half a tablet before evening meal  150 tablet  3  . sildenafil (VIAGRA) 50 MG tablet Take 1 tablet (50 mg total) by mouth daily as needed.  10 tablet  6  . NIFEdipine (PROCARDIA-XL/ADALAT CC) 60 MG 24 hr tablet Take 1 tablet (60 mg total) by mouth daily.  30 tablet  6  . DISCONTD: atorvastatin (LIPITOR) 40 MG tablet TAKE 1 TABLET (40 MG TOTAL) BY MOUTH DAILY.  100 tablet  3  .  DISCONTD: fosinopril (MONOPRIL) 40 MG tablet Two tabs daily  200 tablet  3  . DISCONTD: furosemide (LASIX) 40 MG tablet TAKE 1 TABLET (40 MG TOTAL) BY MOUTH DAILY.  100 tablet  2  . DISCONTD: glipiZIDE (GLUCOTROL XL) 10 MG 24 hr tablet Take 1 tablet (10 mg total) by mouth 2 (two) times daily.  200 tablet  3    BP 178/100  Pulse 56  Ht 5' 8.5" (1.74 m)  Wt 236 lb (107.049 kg)  BMI 35.36 kg/m2  SpO2 98% General: NAD, obese Neck: Thick, no JVD, no thyromegaly or thyroid nodule.  Lungs: Clear to auscultation bilaterally with normal respiratory effort. CV: Nondisplaced PMI.  Heart regular S1/S2, +S4, no murmur.  1+ edema 1/2 up lower legs bilaterally.  No carotid bruit.  Normal pedal pulses.  Abdomen: Soft, nontender, no hepatosplenomegaly, no distention.  Skin: Intact without lesions or rashes.  Neurologic: Alert and oriented x 3.  Psych: Normal  affect. Extremities: No clubbing or cyanosis.  HEENT: Normal.

## 2012-03-23 NOTE — Assessment & Plan Note (Signed)
Goal LDL at least < 100 with diabetes.  He was at goal in 5/13.

## 2012-03-24 LAB — BASIC METABOLIC PANEL
CO2: 30 mEq/L (ref 19–32)
Chloride: 105 mEq/L (ref 96–112)
Creatinine, Ser: 0.9 mg/dL (ref 0.4–1.5)

## 2012-03-27 ENCOUNTER — Telehealth: Payer: Self-pay | Admitting: Cardiology

## 2012-03-27 NOTE — Telephone Encounter (Signed)
The patient is aware of his results. 

## 2012-03-27 NOTE — Telephone Encounter (Signed)
Pt was told to call today to get lab results.

## 2012-04-13 ENCOUNTER — Ambulatory Visit (HOSPITAL_BASED_OUTPATIENT_CLINIC_OR_DEPARTMENT_OTHER): Payer: BC Managed Care – PPO | Admitting: Radiology

## 2012-04-13 ENCOUNTER — Ambulatory Visit (HOSPITAL_COMMUNITY)
Admission: RE | Admit: 2012-04-13 | Discharge: 2012-04-13 | Disposition: A | Discharge status: Home / Self Care | Payer: BC Managed Care – PPO | Source: Ambulatory Visit | Attending: Cardiology | Admitting: Cardiology

## 2012-04-13 DIAGNOSIS — I10 Essential (primary) hypertension: Secondary | ICD-10-CM | POA: Insufficient documentation

## 2012-04-13 DIAGNOSIS — R609 Edema, unspecified: Secondary | ICD-10-CM

## 2012-04-13 MED ORDER — GADOBENATE DIMEGLUMINE 529 MG/ML IV SOLN
20.0000 mL | Freq: Once | INTRAVENOUS | Status: AC
  Administered 2012-04-13: 20 mL via INTRAVENOUS

## 2012-04-13 NOTE — Progress Notes (Signed)
Echocardiogram performed.  

## 2012-04-14 ENCOUNTER — Telehealth: Payer: Self-pay | Admitting: *Deleted

## 2012-04-14 ENCOUNTER — Other Ambulatory Visit: Payer: Self-pay | Admitting: Family Medicine

## 2012-04-14 NOTE — Telephone Encounter (Signed)
-   I am going to add nifedipine XL 60 mg daily. I will titrate this up as needed. Ideally, this will replace doxazosin but will continue all his other meds for now. He will check his BP daily with his home cuff. We will call him in 2 wks to see what his BP is running and will titrate up nifedipine XL at that time if needed.   04/14/12--LMTCB

## 2012-04-21 ENCOUNTER — Encounter (HOSPITAL_BASED_OUTPATIENT_CLINIC_OR_DEPARTMENT_OTHER): Payer: BC Managed Care – PPO

## 2012-04-25 NOTE — Telephone Encounter (Signed)
Spoke with pt. Pt states for the last 10 days his BP has been good in the 120s/70s range. He has an appt with Dr Shirlee Latch 04/27/12 and will bring his BP log to the appt with Dr Shirlee Latch.

## 2012-04-26 ENCOUNTER — Encounter: Payer: Self-pay | Admitting: Gastroenterology

## 2012-04-27 ENCOUNTER — Encounter: Payer: Self-pay | Admitting: Cardiology

## 2012-04-27 ENCOUNTER — Ambulatory Visit (INDEPENDENT_AMBULATORY_CARE_PROVIDER_SITE_OTHER): Payer: BC Managed Care – PPO | Admitting: Cardiology

## 2012-04-27 VITALS — BP 132/72 | HR 62 | Ht 68.5 in | Wt 232.0 lb

## 2012-04-27 DIAGNOSIS — I10 Essential (primary) hypertension: Secondary | ICD-10-CM

## 2012-04-27 DIAGNOSIS — E785 Hyperlipidemia, unspecified: Secondary | ICD-10-CM

## 2012-04-27 NOTE — Patient Instructions (Addendum)
Your physician wants you to follow-up in: 4 months with Dr Shirlee Latch. (January 2014). You will receive a reminder letter in the mail two months in advance. If you don't receive a letter, please call our office to schedule the follow-up appointment.   Dr Shirlee Latch said you can cancel the appointment for the sleep study.

## 2012-04-27 NOTE — Assessment & Plan Note (Signed)
Goal LDL at least < 100 with diabetes.  He was at goal in 5/13.  

## 2012-04-27 NOTE — Assessment & Plan Note (Signed)
BP is now under good control after starting nifedipine XL.  If he remains stable, we can titrate this up and doxazosin down.  MRA of the abdomen showed no renal artery stenosis.  He has been watching his salt intake.  I suspect some degree of sleep apnea, which can contribute to HTN, but he is not going to be able to get a sleep study until he gets on Medicare in 2014.  Echo showed normal EF with mild LVH.

## 2012-04-27 NOTE — Progress Notes (Signed)
Patient ID: Matthew Serrano, male   DOB: Sep 12, 1945, 66 y.o.   MRN: [id number] PCP: Dr. Tawanna Cooler  66 yo presented initially for cardiology evaluation of difficult-to-control HTN.  Patient has been known to be hypertensive for years, but per his report, his BP had been out of control for a few months prior to the initial appointment here.  Before that, he had it controlled on medications.  At last appointment, I added nifedipine XL 60 mg daily.  Since starting this medication, his BP has been consistently < 140/90.  Weight is down 4 lbs since last appointment.  He is trying to watch the salt in his diet better. He has good exercise tolerance and denies any exertional dyspnea or chest pain.  No syncope or headaches.  He does snore loudly and has been told that he sometimes stops breathing briefly in his sleep.  He has significant daytime sleepiness.  I had wanted him to get a sleep study but his insurance won't cover it.  We are going to hold off on this until he gets on Medicare.  He had renal artery dopplers done in 6/13.  This was a technically difficult study and the ostia of the renal arteries were poorly visualized.  I had him get an MRA of his abdomen, which showed no evidence for renal artery stenosis.  Echo showed normal EF with mild LV hypertrophy.    Labs (5/13): LDL 88, HDL 49, TSH 4, K 4.7, creatinine 0.9 Labs (8/13): K 3.6, creatinine 0.9  PMH: 1. HTN: Poorly controlled for a number of months.  Renal artery doppler US (6/13) was technically difficult and did not image the ostia of the renal arteries though the mid to distal arteries had no significant stenosis.  MRA abdomen (8/13) showed no renal artery stenosis.  2. DM2 3. Hyperlipidemia 4. Hypothyroidism 5. Erectile dysfunction 6. Echo (8/13): EF 55-60%, mild LV hypertrophy.   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   FH: Sisters with HTN.  No premature CAD.   Current Outpatient Prescriptions  Medication Sig  Dispense Refill  . aspirin 81 MG tablet Take 1 tablet (81 mg total) by mouth daily.  30 tablet  0  . atorvastatin (LIPITOR) 40 MG tablet Take 1 tablet (40 mg total) by mouth daily.  100 tablet  3  . cloNIDine (CATAPRES) 0.2 MG tablet 2 in the AM and 2 in the PM  400 tablet  3  . doxazosin (CARDURA) 8 MG tablet Take 1 tablet (8 mg total) by mouth at bedtime.  100 tablet  3  . fosinopril (MONOPRIL) 40 MG tablet TAKE 2 TABLETS BY MOUTH EVERY DAY  200 tablet  2  . furosemide (LASIX) 40 MG tablet 2 tabs in the morning  100 tablet  3  . glipiZIDE (GLUCOTROL XL) 10 MG 24 hr tablet TAKE 1 TABLET (10 MG TOTAL) BY MOUTH 2 (TWO) TIMES DAILY.  200 tablet  3  . KLOR-CON M20 20 MEQ tablet TAKE 1 TABLET BY MOUTH TWICE A DAY  200 tablet  2  . labetalol (NORMODYNE) 200 MG tablet 3 tablets in the morning and 3 tablets at bedtime  600 tablet  3  . levothyroxine (SYNTHROID, LEVOTHROID) 150 MCG tablet TAKE 1 TABLET (150 MCG TOTAL) BY MOUTH DAILY.  100 tablet  3  . metFORMIN (GLUCOPHAGE) 1000 MG tablet 1 tablet before breakfast and a half a tablet before evening meal  150 tablet  3  . NIFEdipine (  PROCARDIA-XL/ADALAT CC) 60 MG 24 hr tablet Take 1 tablet (60 mg total) by mouth daily.  30 tablet  6  . sildenafil (VIAGRA) 50 MG tablet Take 1 tablet (50 mg total) by mouth daily as needed.  10 tablet  6  . DISCONTD: doxazosin (CARDURA) 8 MG tablet TAKE 1 TABLET BY MOUTH EVERY DAY  100 tablet  0    BP 132/72  Pulse 62  Ht 5' 8.5" (1.74 m)  Wt 232 lb (105.235 kg)  BMI 34.76 kg/m2  SpO2 98% General: NAD, obese Neck: Thick, no JVD, no thyromegaly or thyroid nodule.  Lungs: Clear to auscultation bilaterally with normal respiratory effort. CV: Nondisplaced PMI.  Heart regular S1/S2, +S4, no murmur.  Trace ankle edema.  No carotid bruit.  Normal pedal pulses.  Abdomen: Soft, nontender, no hepatosplenomegaly, no distention.  Neurologic: Alert and oriented x 3.  Psych: Normal affect. Extremities: No clubbing or cyanosis.

## 2012-05-01 ENCOUNTER — Encounter (HOSPITAL_BASED_OUTPATIENT_CLINIC_OR_DEPARTMENT_OTHER): Payer: BC Managed Care – PPO

## 2012-07-29 ENCOUNTER — Other Ambulatory Visit: Payer: Self-pay | Admitting: Family Medicine

## 2012-07-30 ENCOUNTER — Other Ambulatory Visit: Payer: Self-pay | Admitting: Family Medicine

## 2012-07-31 ENCOUNTER — Encounter: Payer: Self-pay | Admitting: Family Medicine

## 2012-10-12 ENCOUNTER — Other Ambulatory Visit: Payer: Self-pay | Admitting: *Deleted

## 2012-10-12 DIAGNOSIS — I10 Essential (primary) hypertension: Secondary | ICD-10-CM

## 2012-10-12 MED ORDER — NIFEDIPINE ER 60 MG PO TB24: 60.0000 mg | ORAL_TABLET | Freq: Every day | ORAL | Status: DC

## 2012-11-13 ENCOUNTER — Other Ambulatory Visit: Payer: Self-pay | Admitting: Family Medicine

## 2012-12-14 ENCOUNTER — Other Ambulatory Visit: Payer: Self-pay | Admitting: Family Medicine

## 2013-01-26 ENCOUNTER — Other Ambulatory Visit: Payer: Self-pay | Admitting: Family Medicine

## 2013-02-03 ENCOUNTER — Other Ambulatory Visit: Payer: Self-pay | Admitting: Family Medicine

## 2013-02-06 ENCOUNTER — Other Ambulatory Visit: Payer: Self-pay | Admitting: Family Medicine

## 2013-02-09 ENCOUNTER — Other Ambulatory Visit: Payer: Self-pay | Admitting: Family Medicine

## 2013-02-12 ENCOUNTER — Other Ambulatory Visit: Payer: Self-pay | Admitting: Family Medicine

## 2013-04-03 ENCOUNTER — Other Ambulatory Visit: Payer: Self-pay | Admitting: Family Medicine

## 2013-04-16 ENCOUNTER — Other Ambulatory Visit: Payer: Self-pay | Admitting: Family Medicine

## 2013-04-24 ENCOUNTER — Other Ambulatory Visit: Payer: Self-pay | Admitting: Family Medicine

## 2013-05-12 ENCOUNTER — Other Ambulatory Visit: Payer: Self-pay | Admitting: Cardiology

## 2013-05-21 ENCOUNTER — Other Ambulatory Visit: Payer: Self-pay | Admitting: Family Medicine

## 2013-07-10 ENCOUNTER — Other Ambulatory Visit: Payer: Self-pay | Admitting: Family Medicine

## 2013-07-14 ENCOUNTER — Other Ambulatory Visit: Payer: Self-pay | Admitting: Family Medicine

## 2013-08-13 ENCOUNTER — Other Ambulatory Visit: Payer: Self-pay | Admitting: Family Medicine

## 2013-09-01 ENCOUNTER — Other Ambulatory Visit: Payer: Self-pay | Admitting: Family Medicine

## 2013-09-25 ENCOUNTER — Other Ambulatory Visit: Payer: Self-pay | Admitting: Family Medicine

## 2013-10-06 ENCOUNTER — Other Ambulatory Visit: Payer: Self-pay | Admitting: Family Medicine

## 2013-10-08 ENCOUNTER — Other Ambulatory Visit: Payer: Self-pay | Admitting: Family Medicine

## 2013-11-02 ENCOUNTER — Other Ambulatory Visit (INDEPENDENT_AMBULATORY_CARE_PROVIDER_SITE_OTHER): Payer: 59

## 2013-11-02 DIAGNOSIS — Z0000 Encounter for general adult medical examination without abnormal findings: Secondary | ICD-10-CM

## 2013-11-02 LAB — LIPID PANEL
Cholesterol: 171 mg/dL (ref 0–200)
HDL: 57.9 mg/dL
LDL Cholesterol: 88 mg/dL (ref 0–99)
Total CHOL/HDL Ratio: 3
Triglycerides: 127 mg/dL (ref 0.0–149.0)
VLDL: 25.4 mg/dL (ref 0.0–40.0)

## 2013-11-02 LAB — POCT URINALYSIS DIPSTICK
BILIRUBIN UA: NEGATIVE
Glucose, UA: NEGATIVE
KETONES UA: NEGATIVE
LEUKOCYTES UA: NEGATIVE
Nitrite, UA: NEGATIVE
PH UA: 6
Spec Grav, UA: 1.015
Urobilinogen, UA: 0.2

## 2013-11-02 LAB — HEPATIC FUNCTION PANEL
ALT: 31 U/L (ref 0–53)
AST: 26 U/L (ref 0–37)
Albumin: 4.3 g/dL (ref 3.5–5.2)
Alkaline Phosphatase: 60 U/L (ref 39–117)
Bilirubin, Direct: 0.1 mg/dL (ref 0.0–0.3)
Total Bilirubin: 0.7 mg/dL (ref 0.3–1.2)
Total Protein: 6.8 g/dL (ref 6.0–8.3)

## 2013-11-02 LAB — CBC WITH DIFFERENTIAL/PLATELET
Basophils Absolute: 0 K/uL (ref 0.0–0.1)
Basophils Relative: 0.9 % (ref 0.0–3.0)
Eosinophils Absolute: 0.2 K/uL (ref 0.0–0.7)
Eosinophils Relative: 3.5 % (ref 0.0–5.0)
HCT: 35.8 % — ABNORMAL LOW (ref 39.0–52.0)
Hemoglobin: 11.8 g/dL — ABNORMAL LOW (ref 13.0–17.0)
Lymphocytes Relative: 37.4 % (ref 12.0–46.0)
Lymphs Abs: 1.8 K/uL (ref 0.7–4.0)
MCHC: 32.9 g/dL (ref 30.0–36.0)
MCV: 84.4 fl (ref 78.0–100.0)
Monocytes Absolute: 0.4 K/uL (ref 0.1–1.0)
Monocytes Relative: 7.8 % (ref 3.0–12.0)
Neutro Abs: 2.5 K/uL (ref 1.4–7.7)
Neutrophils Relative %: 50.4 % (ref 43.0–77.0)
Platelets: 223 K/uL (ref 150.0–400.0)
RBC: 4.25 Mil/uL (ref 4.22–5.81)
RDW: 14.1 % (ref 11.5–14.6)
WBC: 4.9 K/uL (ref 4.5–10.5)

## 2013-11-02 LAB — TSH: TSH: 4.52 u[IU]/mL (ref 0.35–5.50)

## 2013-11-02 LAB — BASIC METABOLIC PANEL
BUN: 15 mg/dL (ref 6–23)
CALCIUM: 9.1 mg/dL (ref 8.4–10.5)
CO2: 28 mEq/L (ref 19–32)
Chloride: 105 mEq/L (ref 96–112)
Creatinine, Ser: 0.9 mg/dL (ref 0.4–1.5)
GFR: 105.79 mL/min (ref >60.00)
Glucose, Bld: 113 mg/dL — ABNORMAL HIGH (ref 70–99)
Potassium: 4.5 mEq/L (ref 3.5–5.1)
Sodium: 142 mEq/L (ref 135–145)

## 2013-11-02 LAB — MICROALBUMIN / CREATININE URINE RATIO
CREATININE, U: 75.2 mg/dL
MICROALB/CREAT RATIO: 1.1 mg/g (ref 0.0–30.0)
Microalb, Ur: 0.8 mg/dL (ref 0.0–1.9)

## 2013-11-02 LAB — PSA: PSA: 11.93 ng/mL — ABNORMAL HIGH (ref 0.10–4.00)

## 2013-11-02 LAB — HEMOGLOBIN A1C: Hgb A1c MFr Bld: 7.5 % — ABNORMAL HIGH (ref 4.6–6.5)

## 2013-11-06 ENCOUNTER — Other Ambulatory Visit: Payer: Self-pay | Admitting: Family Medicine

## 2013-11-13 ENCOUNTER — Ambulatory Visit (INDEPENDENT_AMBULATORY_CARE_PROVIDER_SITE_OTHER): Payer: 59 | Admitting: Family Medicine

## 2013-11-13 ENCOUNTER — Encounter: Payer: Self-pay | Admitting: Family Medicine

## 2013-11-13 VITALS — BP 138/82 | Temp 97.8°F | Ht 67.75 in | Wt 240.0 lb

## 2013-11-13 DIAGNOSIS — E119 Type 2 diabetes mellitus without complications: Secondary | ICD-10-CM

## 2013-11-13 DIAGNOSIS — I10 Essential (primary) hypertension: Secondary | ICD-10-CM

## 2013-11-13 DIAGNOSIS — E785 Hyperlipidemia, unspecified: Secondary | ICD-10-CM

## 2013-11-13 DIAGNOSIS — E039 Hypothyroidism, unspecified: Secondary | ICD-10-CM

## 2013-11-13 DIAGNOSIS — R972 Elevated prostate specific antigen [PSA]: Secondary | ICD-10-CM

## 2013-11-13 DIAGNOSIS — Z23 Encounter for immunization: Secondary | ICD-10-CM

## 2013-11-13 DIAGNOSIS — F528 Other sexual dysfunction not due to a substance or known physiological condition: Secondary | ICD-10-CM

## 2013-11-13 MED ORDER — DOXAZOSIN MESYLATE 8 MG PO TABS: 8.0000 mg | ORAL_TABLET | Freq: Every day | ORAL | Status: DC

## 2013-11-13 MED ORDER — GLIPIZIDE ER 10 MG PO TB24: ORAL_TABLET | ORAL | Status: DC

## 2013-11-13 MED ORDER — LABETALOL HCL 200 MG PO TABS: ORAL_TABLET | ORAL | Status: DC

## 2013-11-13 MED ORDER — METFORMIN HCL 1000 MG PO TABS: ORAL_TABLET | ORAL | Status: DC

## 2013-11-13 MED ORDER — FOSINOPRIL SODIUM 40 MG PO TABS: ORAL_TABLET | ORAL | Status: DC

## 2013-11-13 MED ORDER — ATORVASTATIN CALCIUM 40 MG PO TABS: 40.0000 mg | ORAL_TABLET | Freq: Every day | ORAL | Status: DC

## 2013-11-13 MED ORDER — SILDENAFIL CITRATE 50 MG PO TABS: 50.0000 mg | ORAL_TABLET | Freq: Every day | ORAL | Status: DC | PRN

## 2013-11-13 MED ORDER — POTASSIUM CHLORIDE CRYS ER 20 MEQ PO TBCR: EXTENDED_RELEASE_TABLET | ORAL | Status: DC

## 2013-11-13 MED ORDER — NIFEDIPINE ER OSMOTIC RELEASE 60 MG PO TB24: ORAL_TABLET | ORAL | Status: DC

## 2013-11-13 MED ORDER — CLONIDINE HCL 0.2 MG PO TABS: ORAL_TABLET | ORAL | Status: DC

## 2013-11-13 MED ORDER — LEVOTHYROXINE SODIUM 150 MCG PO TABS: ORAL_TABLET | ORAL | Status: DC

## 2013-11-13 MED ORDER — FUROSEMIDE 40 MG PO TABS: ORAL_TABLET | ORAL | Status: DC

## 2013-11-13 NOTE — Progress Notes (Signed)
   Subjective:    Patient ID: Matthew Serrano, male    DOB: February 12, 1946, 68 y.o.   MRN: [id number]  HPI Matthew Serrano is a 68 year old male nonsmoker who comes in today for a Medicare wellness examination because of a history of underlying hyperlipidemia, hypertension, diabetes, erectile dysfunction, and hypothyroidism  His medication reviewed in detail been no changes in  He gets routine eye care, dental care, due for colonoscopy because his first colonoscopy showed polyps he never went back for followup. Advised to call them today  Cognitive function normal he walks on a regular basis home health safety reviewed no issues identified, no guns in the house, he does have a health care power of attorney and living well  Vaccinations updated   Review of Systems  Constitutional: Negative.   HENT: Negative.   Eyes: Negative.   Respiratory: Negative.   Cardiovascular: Negative.   Gastrointestinal: Negative.   Genitourinary: Negative.   Musculoskeletal: Negative.   Skin: Negative.   Neurological: Negative.   Psychiatric/Behavioral: Negative.        Objective:   Physical Exam  Nursing note and vitals reviewed. Constitutional: He is oriented to person, place, and time. He appears well-developed and well-nourished.  HENT:  Head: Normocephalic and atraumatic.  Right Ear: External ear normal.  Left Ear: External ear normal.  Nose: Nose normal.  Mouth/Throat: Oropharynx is clear and moist.  Eyes: Conjunctivae and EOM are normal. Pupils are equal, round, and reactive to light.  Neck: Normal range of motion. Neck supple. No JVD present. No tracheal deviation present. No thyromegaly present.  Cardiovascular: Normal rate, regular rhythm, normal heart sounds and intact distal pulses.  Exam reveals no gallop and no friction rub.   No murmur heard. New finding...Marland KitchenMarland KitchenMarland Kitchen right and left soft carotid bruits  Pulmonary/Chest: Effort normal and breath sounds normal. No stridor. No respiratory distress. He  has no wheezes. He has no rales. He exhibits no tenderness.  Abdominal: Soft. Bowel sounds are normal. He exhibits no distension and no mass. There is no tenderness. There is no rebound and no guarding.  Genitourinary: Rectum normal and penis normal. Guaiac negative stool. No penile tenderness.  2+ symmetrical BPH  Musculoskeletal: Normal range of motion. He exhibits no edema and no tenderness.  Lymphadenopathy:    He has no cervical adenopathy.  Neurological: He is alert and oriented to person, place, and time. He has normal reflexes. No cranial nerve deficit. He exhibits normal muscle tone.  Skin: Skin is warm and dry. No rash noted. No erythema. No pallor.  Psychiatric: He has a normal mood and affect. His behavior is normal. Judgment and thought content normal.          Assessment & Plan:  Hypertension at goal continue current therapy  Diabetes at goal continue current therapy  Obesity weight 290 pounds,,,,,,,, diet and exercise  Hyperlipidemia continue Lipitor  Erectile dysfunction continue Viagra  Hypothyroidism continue Synthroid  History colon polyps followup in GI ASAP,

## 2013-11-13 NOTE — Progress Notes (Signed)
Pre visit review using our clinic review tool, if applicable. No additional management support is needed unless otherwise documented below in the visit note. 

## 2013-11-13 NOTE — Patient Instructions (Signed)
Continue your current medication  Start a walking program 30 minutes daily  Return in 6 months for followup on your diabetes  Labs nonfasting one week prior  [email or web address]

## 2013-11-14 ENCOUNTER — Telehealth: Payer: Self-pay | Admitting: Family Medicine

## 2013-11-14 NOTE — Telephone Encounter (Signed)
Relevant patient education mailed to patient.  

## 2013-11-22 ENCOUNTER — Telehealth: Payer: Self-pay

## 2013-11-22 NOTE — Telephone Encounter (Signed)
Relevant patient education mailed to patient.  

## 2013-12-25 ENCOUNTER — Other Ambulatory Visit: Payer: 59

## 2014-01-03 ENCOUNTER — Ambulatory Visit: Payer: 59 | Admitting: Family Medicine

## 2014-02-26 ENCOUNTER — Other Ambulatory Visit: Payer: Self-pay | Admitting: Family Medicine

## 2014-05-09 ENCOUNTER — Other Ambulatory Visit (INDEPENDENT_AMBULATORY_CARE_PROVIDER_SITE_OTHER): Payer: 59

## 2014-05-09 DIAGNOSIS — E119 Type 2 diabetes mellitus without complications: Secondary | ICD-10-CM

## 2014-05-09 LAB — BASIC METABOLIC PANEL
BUN: 18 mg/dL (ref 6–23)
CALCIUM: 8.6 mg/dL (ref 8.4–10.5)
CHLORIDE: 104 meq/L (ref 96–112)
CO2: 27 meq/L (ref 19–32)
CREATININE: 1.3 mg/dL (ref 0.4–1.5)
GFR: 69.03 mL/min (ref >60.00)
Glucose, Bld: 111 mg/dL — ABNORMAL HIGH (ref 70–99)
Potassium: 3.3 mEq/L — ABNORMAL LOW (ref 3.5–5.1)
Sodium: 140 mEq/L (ref 135–145)

## 2014-05-09 LAB — HEMOGLOBIN A1C: Hgb A1c MFr Bld: 7 % — ABNORMAL HIGH (ref 4.6–6.5)

## 2014-05-16 ENCOUNTER — Encounter: Payer: Self-pay | Admitting: Family Medicine

## 2014-05-16 ENCOUNTER — Ambulatory Visit (INDEPENDENT_AMBULATORY_CARE_PROVIDER_SITE_OTHER): Payer: 59 | Admitting: Family Medicine

## 2014-05-16 VITALS — BP 120/80 | Temp 98.5°F | Wt 240.0 lb

## 2014-05-16 DIAGNOSIS — E119 Type 2 diabetes mellitus without complications: Secondary | ICD-10-CM

## 2014-05-16 DIAGNOSIS — D508 Other iron deficiency anemias: Secondary | ICD-10-CM

## 2014-05-16 DIAGNOSIS — F528 Other sexual dysfunction not due to a substance or known physiological condition: Secondary | ICD-10-CM

## 2014-05-16 DIAGNOSIS — Z23 Encounter for immunization: Secondary | ICD-10-CM

## 2014-05-16 DIAGNOSIS — R972 Elevated prostate specific antigen [PSA]: Secondary | ICD-10-CM

## 2014-05-16 DIAGNOSIS — E039 Hypothyroidism, unspecified: Secondary | ICD-10-CM

## 2014-05-16 DIAGNOSIS — E785 Hyperlipidemia, unspecified: Secondary | ICD-10-CM

## 2014-05-16 DIAGNOSIS — I10 Essential (primary) hypertension: Secondary | ICD-10-CM

## 2014-05-16 LAB — CBC WITH DIFFERENTIAL/PLATELET
Basophils Absolute: 0 10*3/uL (ref 0.0–0.1)
Basophils Relative: 0.6 % (ref 0.0–3.0)
EOS PCT: 2.5 % (ref 0.0–5.0)
Eosinophils Absolute: 0.1 10*3/uL (ref 0.0–0.7)
HEMATOCRIT: 33.8 % — AB (ref 39.0–52.0)
HEMOGLOBIN: 11.1 g/dL — AB (ref 13.0–17.0)
LYMPHS ABS: 1.8 10*3/uL (ref 0.7–4.0)
LYMPHS PCT: 34 % (ref 12.0–46.0)
MCHC: 32.9 g/dL (ref 30.0–36.0)
MCV: 85.5 fl (ref 78.0–100.0)
Monocytes Absolute: 0.5 10*3/uL (ref 0.1–1.0)
Monocytes Relative: 10.4 % (ref 3.0–12.0)
Neutro Abs: 2.7 10*3/uL (ref 1.4–7.7)
Neutrophils Relative %: 52.5 % (ref 43.0–77.0)
Platelets: 189 10*3/uL (ref 150.0–400.0)
RBC: 3.95 Mil/uL — ABNORMAL LOW (ref 4.22–5.81)
RDW: 14.5 % (ref 11.5–15.5)
WBC: 5.2 10*3/uL (ref 4.0–10.5)

## 2014-05-16 LAB — POCT HEMOGLOBIN: Hemoglobin: 10.9 g/dL — AB (ref 14.1–18.1)

## 2014-05-16 LAB — FERRITIN: FERRITIN: 54.3 ng/mL (ref 22.0–322.0)

## 2014-05-16 LAB — IRON: Iron: 63 ug/dL (ref 42–165)

## 2014-05-16 LAB — VITAMIN B12: Vitamin B-12: 227 pg/mL (ref 211–911)

## 2014-05-16 NOTE — Progress Notes (Signed)
Pre visit review using our clinic review tool, if applicable. No additional management support is needed unless otherwise documented below in the visit note. Lab Results  Component Value Date   HGBA1C 7.0* 05/09/2014   HGBA1C 7.5* 11/02/2013   HGBA1C 7.0* 01/13/2012   Lab Results  Component Value Date   MICROALBUR 0.8 11/02/2013   LDLCALC 88 11/02/2013   CREATININE 1.3 05/09/2014

## 2014-05-16 NOTE — Progress Notes (Signed)
   Subjective:    Patient ID: Matthew Serrano, male    DOB: March 02, 1946, 68 y.o.   MRN: [id number]  HPI Mr. Altizer  is a 68 year old male nonsmoker who comes in today for followup of 2 problems  He has diabetes type 2 on glipizide 10 mg twice a day, metformin 1000 mg in the morning and 500 prior to his evening male. A1c 7.0% at goal  He has hypertension takes Cardura 8 mg daily, clonidine 0.4 mg twice a day, Monopril 40 mg 2 tabs daily, Lasix 40 mg daily, labetalol 600 mg in morning and 4 in the evening, Procardia 60 mg daily BP 120/80  We stopped his aspirin in March because he had a low-grade anemia we'll recheck blood count today he's been on iron since that time. Hemoglobin today 10.9 which is lower than it was before   Review of Systems    review of systems otherwise negative Objective:   Physical Exam  Well-developed well-nourished male in no acute distress vital signs stable he is a bit febrile BP right arm sitting position 120/80      Assessment & Plan:  Diabetes type 2 at goal........ continue current therapy  Hypertension at goal........ continue current therapy  Low-grade anemia.......Marland Kitchen recheck blood count ............ hemoglobin 10.9..........Marland Kitchen begin workup

## 2014-05-16 NOTE — Patient Instructions (Addendum)
Recheck your blood count today  Set up for the third week in March for your annual physical exam  Labs one week prior  Continue current medications  Remember to walk 30 minutes daily  Labs today  Followup next Tuesday

## 2014-05-23 ENCOUNTER — Ambulatory Visit (INDEPENDENT_AMBULATORY_CARE_PROVIDER_SITE_OTHER): Payer: 59 | Admitting: Family Medicine

## 2014-05-23 ENCOUNTER — Encounter: Payer: Self-pay | Admitting: Family Medicine

## 2014-05-23 VITALS — BP 124/84 | Temp 98.1°F | Wt 237.0 lb

## 2014-05-23 DIAGNOSIS — D509 Iron deficiency anemia, unspecified: Secondary | ICD-10-CM

## 2014-05-23 DIAGNOSIS — D638 Anemia in other chronic diseases classified elsewhere: Secondary | ICD-10-CM

## 2014-05-23 LAB — POCT HEMOGLOBIN: Hemoglobin: 11.2 g/dL — AB (ref 14.1–18.1)

## 2014-05-23 NOTE — Progress Notes (Signed)
   Subjective:    Patient ID: Matthew Serrano, male    DOB: [birth year], 68 y.o.   MRN: [id number]  HPI Matthew Serrano is a 68 year old male nonsmoker who has underlying hyperlipidemia, hypertension, diabetes type 2, hypothyroidism, who comes in today for followup of anemia  We saw a while back and his hemoglobin dropped to 10.9.............. 6 months ago was 11.82 years ago it was 12.7 and 3 years ago it was 13.2. White count normal platelet count normal.  We stopped the aspirin and got some iron studies and B12 level all of which were normal.  If you look at history and his hemoglobin has been slowly dropping over the past 2 years which may be a reflection of his chronic renal disease from underlying diabetes. However I would like to get a heme consult for confirmation   Review of Systems    review of systems otherwise negative Objective:   Physical Exam  Well-developed well-nourished male no acute distress vital signs stable he is afebrile      Assessment & Plan:  Anemia...........Marland Kitchen probably secondary to chronic disease......Marland Kitchen Diabetes......... however I would request a heme consult for confirmation

## 2014-05-23 NOTE — Progress Notes (Signed)
Pre visit review using our clinic review tool, if applicable. No additional management support is needed unless otherwise documented below in the visit note. 

## 2014-05-23 NOTE — Patient Instructions (Signed)
Continue to hold the aspirin  Continue one iron pill daily  Consult with Dr. Jacqlyn Krauss for further evaluation of your anemia

## 2014-06-10 ENCOUNTER — Ambulatory Visit (HOSPITAL_BASED_OUTPATIENT_CLINIC_OR_DEPARTMENT_OTHER): Payer: 59 | Admitting: Hematology and Oncology

## 2014-06-10 ENCOUNTER — Ambulatory Visit: Payer: 59

## 2014-06-10 ENCOUNTER — Encounter: Payer: Self-pay | Admitting: Hematology and Oncology

## 2014-06-10 VITALS — BP 128/73 | HR 62 | Temp 98.2°F | Resp 20 | Ht 67.75 in | Wt 237.6 lb

## 2014-06-10 DIAGNOSIS — F1020 Alcohol dependence, uncomplicated: Secondary | ICD-10-CM

## 2014-06-10 DIAGNOSIS — D638 Anemia in other chronic diseases classified elsewhere: Secondary | ICD-10-CM

## 2014-06-10 DIAGNOSIS — E119 Type 2 diabetes mellitus without complications: Secondary | ICD-10-CM | POA: Diagnosis not present

## 2014-06-10 DIAGNOSIS — R972 Elevated prostate specific antigen [PSA]: Secondary | ICD-10-CM

## 2014-06-10 NOTE — Progress Notes (Signed)
[place] NOTE  Patient Care Team: Dorena Cookey, MD as PCP - General  CHIEF COMPLAINTS/PURPOSE OF CONSULTATION:  Chronic anemia  HISTORY OF PRESENTING ILLNESS:  Matthew Serrano 68 y.o. male is here because of chronic anemia  He was found to have abnormal CBC from routine blood monitoring. He started to have mild anemia around May of 2013. His hemoglobin range from 10.9-12.7 He denies recent chest pain on exertion, shortness of breath on minimal exertion, pre-syncopal episodes, or palpitations. He had not noticed any recent bleeding such as epistaxis, hematuria or hematochezia The patient denies over the counter NSAID ingestion. He is not on antiplatelets agents. His last colonoscopy was in August 2006. He had no prior history or diagnosis of cancer. His age appropriate screening programs are up-to-date. He was noted to have elevated PSA in March of 2015 He denies any pica and eats a variety of diet. He never donated blood or received blood transfusion The patient was prescribed oral iron supplements and he takes one supplement a day before breakfast. He has mild constipation from it.  MEDICAL HISTORY:  Past Medical History  Diagnosis Date  . ED (erectile dysfunction)   . HYPERLIPIDEMIA 07/06/2007  . HYPERTENSION 07/06/2007  . DIABETES MELLITUS, TYPE II 07/06/2007  . Elevated PSA 01/20/2012  . HYPOTHYROIDISM 07/06/2007    SURGICAL HISTORY: Past Surgical History  Procedure Laterality Date  . Colonoscopy w/ biopsies and polypectomy      SOCIAL HISTORY: History   Social History  . Marital Status: Widowed    Spouse Name: N/A    Number of Children: N/A  . Years of Education: N/A   Occupational History  . Not on file.   Social History Main Topics  . Smoking status: Former Research scientist (life sciences)  . Smokeless tobacco: Never Used     Comment: quit 2005  . Alcohol Use: 12.6 oz/week    21 Cans of beer per week  . Drug Use: No  . Sexual Activity: Not on file    Other Topics Concern  . Not on file   Social History Narrative  . No narrative on file    FAMILY HISTORY: Family History  Problem Relation Age of Onset  . Hypertension Other   . Breast cancer Sister   . Breast cancer      family history  . Breast cancer Brother     prostate ca    ALLERGIES:  has No Known Allergies.  MEDICATIONS:  Current Outpatient Prescriptions  Medication Sig Dispense Refill  . atorvastatin (LIPITOR) 40 MG tablet Take 1 tablet (40 mg total) by mouth daily.  100 tablet  3  . cloNIDine (CATAPRES) 0.2 MG tablet TAKE 2 IN THE AM AND 2 IN THE PM  360 tablet  3  . doxazosin (CARDURA) 8 MG tablet Take 1 tablet (8 mg total) by mouth at bedtime.  100 tablet  3  . ferrous fumarate (HEMOCYTE - 106 MG FE) 325 (106 FE) MG TABS tablet Take 1 tablet by mouth.      . fosinopril (MONOPRIL) 40 MG tablet TAKE 2 TABLETS BY MOUTH EVERY DAY  200 tablet  3  . furosemide (LASIX) 40 MG tablet TAKE 2 TABLETS EVERY MORNING  200 tablet  3  . glipiZIDE (GLUCOTROL XL) 10 MG 24 hr tablet TAKE 1 TABLET (10 MG TOTAL) BY MOUTH 2 (TWO) TIMES DAILY.  180 tablet  3  . labetalol (NORMODYNE) 200 MG tablet TAKE 3 TABLETS IN THE A.M. AND 2  TABLETS IN THE PM  500 tablet  3  . levothyroxine (SYNTHROID, LEVOTHROID) 150 MCG tablet TAKE 1 TABLET DAILY  100 tablet  2  . metFORMIN (GLUCOPHAGE) 1000 MG tablet TAKE ONE TABLET BEFORE BREAKFAST AND HALF TABLET BEFORE EVENING MEAL  150 tablet  0  . NIFEdipine (PROCARDIA XL/ADALAT-CC) 60 MG 24 hr tablet TAKE 1 TABLET (60 MG TOTAL) BY MOUTH DAILY.  100 tablet  3  . potassium chloride SA (KLOR-CON M20) 20 MEQ tablet TAKE 1 TABLET BY MOUTH TWICE A DAY  200 tablet  3  . sildenafil (VIAGRA) 50 MG tablet Take 1 tablet (50 mg total) by mouth daily as needed.  10 tablet  10   No current facility-administered medications for this visit.    REVIEW OF SYSTEMS:   Constitutional: Denies fevers, chills or abnormal night sweats Eyes: Denies blurriness of vision, double  vision or watery eyes Ears, nose, mouth, throat, and face: Denies mucositis or sore throat Respiratory: Denies cough, dyspnea or wheezes Cardiovascular: Denies palpitation, chest discomfort or lower extremity swelling Gastrointestinal:  Denies nausea, heartburn or change in bowel habits Skin: Denies abnormal skin rashes Lymphatics: Denies new lymphadenopathy or easy bruising Neurological:Denies numbness, tingling or new weaknesses Behavioral/Psych: Mood is stable, no new changes  All other systems were reviewed with the patient and are negative.  PHYSICAL EXAMINATION: ECOG PERFORMANCE STATUS: 0 - Asymptomatic  Filed Vitals:   06/10/14 1104  BP: 128/73  Pulse: 62  Temp: 98.2 F (36.8 C)  Resp: 20   Filed Weights   06/10/14 1104  Weight: 237 lb 9.6 oz (107.775 kg)    GENERAL:alert, no distress and comfortable. He is moderately obese SKIN: skin color, texture, turgor are normal, no rashes or significant lesions EYES: normal, conjunctiva are pink and non-injected, sclera clear OROPHARYNX:no exudate, no erythema and lips, buccal mucosa, and tongue normal  NECK: supple, thyroid normal size, non-tender, without nodularity LYMPH:  no palpable lymphadenopathy in the cervical, axillary or inguinal LUNGS: clear to auscultation and percussion with normal breathing effort HEART: regular rate & rhythm and no murmurs and no lower extremity edema ABDOMEN:abdomen soft, non-tender and normal bowel sounds Musculoskeletal:no cyanosis of digits and no clubbing  PSYCH: alert & oriented x 3 with fluent speech NEURO: no focal motor/sensory deficits  LABORATORY DATA:  I have reviewed the data as listed Recent Results (from the past 2160 hour(s))  HEMOGLOBIN A1C     Status: Abnormal   Collection Time    05/09/14  3:03 PM      Result Value Ref Range   Hemoglobin A1C 7.0 (*) 4.6 - 6.5 %   Comment: Glycemic Control Guidelines for People with Diabetes:Non Diabetic:  <6%Goal of Therapy:  <7%Additional Action Suggested:  >1%   BASIC METABOLIC PANEL     Status: Abnormal   Collection Time    05/09/14  3:03 PM      Result Value Ref Range   Sodium 140  135 - 145 mEq/L   Potassium 3.3 (*) 3.5 - 5.1 mEq/L   Chloride 104  96 - 112 mEq/L   CO2 27  19 - 32 mEq/L   Glucose, Bld 111 (*) 70 - 99 mg/dL   BUN 18  6 - 23 mg/dL   Creatinine, Ser 1.3  0.4 - 1.5 mg/dL   Calcium 8.6  8.4 - 10.5 mg/dL   GFR 69.03  >60.00 mL/min  POCT HEMOGLOBIN     Status: Abnormal   Collection Time    05/16/14  3:51  PM      Result Value Ref Range   Hemoglobin 10.9 (*) 14.1 - 18.1 g/dL  FERRITIN     Status: None   Collection Time    05/16/14  4:04 PM      Result Value Ref Range   Ferritin 54.3  22.0 - 322.0 ng/mL  IRON     Status: None   Collection Time    05/16/14  4:04 PM      Result Value Ref Range   Iron 63  42 - 165 ug/dL  VITAMIN B12     Status: None   Collection Time    05/16/14  4:04 PM      Result Value Ref Range   Vitamin B-12 227  211 - 911 pg/mL  CBC WITH DIFFERENTIAL     Status: Abnormal   Collection Time    05/16/14  4:04 PM      Result Value Ref Range   WBC 5.2  4.0 - 10.5 K/uL   RBC 3.95 (*) 4.22 - 5.81 Mil/uL   Hemoglobin 11.1 (*) 13.0 - 17.0 g/dL   HCT 33.8 (*) 39.0 - 52.0 %   MCV 85.5  78.0 - 100.0 fl   MCHC 32.9  30.0 - 36.0 g/dL   RDW 14.5  11.5 - 15.5 %   Platelets 189.0  150.0 - 400.0 K/uL   Neutrophils Relative % 52.5  43.0 - 77.0 %   Lymphocytes Relative 34.0  12.0 - 46.0 %   Monocytes Relative 10.4  3.0 - 12.0 %   Eosinophils Relative 2.5  0.0 - 5.0 %   Basophils Relative 0.6  0.0 - 3.0 %   Neutro Abs 2.7  1.4 - 7.7 K/uL   Lymphs Abs 1.8  0.7 - 4.0 K/uL   Monocytes Absolute 0.5  0.1 - 1.0 K/uL   Eosinophils Absolute 0.1  0.0 - 0.7 K/uL   Basophils Absolute 0.0  0.0 - 0.1 K/uL  POCT HEMOGLOBIN     Status: Abnormal   Collection Time    05/23/14  3:25 PM      Result Value Ref Range   Hemoglobin 11.2 (*) 14.1 - 18.1 g/dL    ASSESSMENT & PLAN:  Anemia  of chronic disease The cause of his anemia is likely multifactorial, from uncontrolled diabetes 2 chronic alcoholism. He is not symptomatic. I recommend continuing PCP followup. I will see him back in the future if his anemia got to less than 10 g. I am also concerned about possible undiagnosed cancer with the elevated PSA and recommend urology followup as soon as possible.  Elevated PSA He has strong family history of prostate cancer and with gradual elevation of PSA, I recommend urology consultation.  Diabetes mellitus His last hemoglobin A1c is at 7%. If we can improve that further, I suspect his anemia chronic disease will improve as well. I recommend the patient to attempt dietary modification and weight loss.  Alcoholism I recommend the patient to cut back on his alcohol intake to less than one beer per day.    All questions were answered. The patient knows to call the clinic with any problems, questions or concerns. I spent 30 minutes counseling the patient face to face. The total time spent in the appointment was 40 minutes and more than 50% was on counseling.     [place], [place], MD 06/10/2014 12:15 PM

## 2014-06-10 NOTE — Assessment & Plan Note (Signed)
The cause of his anemia is likely multifactorial, from uncontrolled diabetes 2 chronic alcoholism. He is not symptomatic. I recommend continuing PCP followup. I will see him back in the future if his anemia got to less than 10 g. I am also concerned about possible undiagnosed cancer with the elevated PSA and recommend urology followup as soon as possible.

## 2014-06-10 NOTE — Assessment & Plan Note (Signed)
I recommend the patient to cut back on his alcohol intake to less than one beer per day.

## 2014-06-10 NOTE — Progress Notes (Signed)
Checked in new patient with no issue prior to seeing the dr. He has appt card and [place] not been out of [place]

## 2014-06-10 NOTE — Assessment & Plan Note (Signed)
He has strong family history of prostate cancer and with gradual elevation of PSA, I recommend urology consultation.

## 2014-06-10 NOTE — Assessment & Plan Note (Signed)
His last hemoglobin A1c is at 7%. If we can improve that further, I suspect his anemia chronic disease will improve as well. I recommend the patient to attempt dietary modification and weight loss.

## 2014-11-03 ENCOUNTER — Other Ambulatory Visit: Payer: Self-pay | Admitting: Family Medicine

## 2014-12-07 ENCOUNTER — Other Ambulatory Visit: Payer: Self-pay | Admitting: Family Medicine

## 2014-12-13 ENCOUNTER — Other Ambulatory Visit: Payer: Self-pay | Admitting: Family Medicine

## 2014-12-19 ENCOUNTER — Other Ambulatory Visit: Payer: Self-pay | Admitting: Family Medicine

## 2015-01-29 ENCOUNTER — Other Ambulatory Visit: Payer: Self-pay | Admitting: Family Medicine

## 2015-02-13 ENCOUNTER — Other Ambulatory Visit: Payer: Self-pay | Admitting: Family Medicine

## 2015-03-10 ENCOUNTER — Other Ambulatory Visit: Payer: Self-pay | Admitting: Family Medicine

## 2015-03-21 ENCOUNTER — Other Ambulatory Visit: Payer: Self-pay | Admitting: Family Medicine

## 2015-05-01 ENCOUNTER — Other Ambulatory Visit: Payer: Self-pay | Admitting: Family Medicine

## 2015-05-13 ENCOUNTER — Other Ambulatory Visit: Payer: Self-pay | Admitting: Family Medicine

## 2015-06-04 ENCOUNTER — Other Ambulatory Visit: Payer: Self-pay | Admitting: Family Medicine

## 2015-06-05 ENCOUNTER — Other Ambulatory Visit: Payer: Self-pay | Admitting: Family Medicine

## 2015-06-11 ENCOUNTER — Other Ambulatory Visit: Payer: Self-pay | Admitting: Family Medicine

## 2015-06-15 ENCOUNTER — Other Ambulatory Visit: Payer: Self-pay | Admitting: Family Medicine

## 2015-07-05 ENCOUNTER — Other Ambulatory Visit: Payer: Self-pay | Admitting: Family Medicine

## 2015-08-10 ENCOUNTER — Other Ambulatory Visit: Payer: Self-pay | Admitting: Family Medicine

## 2015-08-11 ENCOUNTER — Other Ambulatory Visit: Payer: Self-pay | Admitting: Family Medicine

## 2015-09-04 ENCOUNTER — Ambulatory Visit (INDEPENDENT_AMBULATORY_CARE_PROVIDER_SITE_OTHER): Payer: 59 | Admitting: Family Medicine

## 2015-09-04 VITALS — BP 110/70 | Temp 98.9°F | Wt 218.0 lb

## 2015-09-04 DIAGNOSIS — K4090 Unilateral inguinal hernia, without obstruction or gangrene, not specified as recurrent: Secondary | ICD-10-CM

## 2015-09-04 NOTE — Progress Notes (Signed)
   Subjective:    Patient ID: Matthew Serrano, male    DOB: [birth year], 70 y.o.   MRN: [id number]  HPI  Emery is a 70 year old single male nonsmoker who comes in today for evaluation of pain in his left scrotum since January 10  He was working on January 10 and was doing a lot of heavy lifting. About 2 hours later had the sudden onset of severe pain and swelling in his left scrotum. He has no fever chills nausea vomiting or diarrhea.  Review of Systems    review of systems negative Objective:   Physical Exam  Well-developed well-nourished male in acute pain  Examination of the genitalia in the standing position shows a right testicle be normal. The left scrotum is extremely swollen and tender      Assessment & Plan:  Acute left inguinal hernia........... Gen. surgery consult today

## 2015-09-04 NOTE — Patient Instructions (Addendum)
We've made an appointment for you to see the general surgeon tomorrow  In the meantime stay at bedrest at home. If your pain increases from a 4 to wear it is now go directly to the emergency room

## 2015-09-04 NOTE — Progress Notes (Signed)
Pre visit review using our clinic review tool, if applicable. No additional management support is needed unless otherwise documented below in the visit note. 

## 2015-09-10 ENCOUNTER — Telehealth: Payer: Self-pay | Admitting: Family Medicine

## 2015-09-10 DIAGNOSIS — I10 Essential (primary) hypertension: Secondary | ICD-10-CM

## 2015-09-10 MED ORDER — NIFEDIPINE ER OSMOTIC RELEASE 60 MG PO TB24: ORAL_TABLET | ORAL | Status: DC

## 2015-09-10 MED ORDER — FOSINOPRIL SODIUM 40 MG PO TABS: 80.0000 mg | ORAL_TABLET | Freq: Every day | ORAL | Status: DC

## 2015-09-10 MED ORDER — POTASSIUM CHLORIDE CRYS ER 20 MEQ PO TBCR: EXTENDED_RELEASE_TABLET | ORAL | Status: DC

## 2015-09-10 NOTE — Telephone Encounter (Signed)
Pt request refill of the following: fosinopril (MONOPRIL) 40 MG tablet , NIFEdipine (PROCARDIA XL/ADALAT-CC) 60 MG 24 hr tablet , potassium chloride SA (KLOR-CON M20) 20 MEQ tablet   Phamacy: [facility]

## 2015-09-10 NOTE — Telephone Encounter (Signed)
Rx sent 

## 2015-09-13 ENCOUNTER — Other Ambulatory Visit: Payer: Self-pay | Admitting: Family Medicine

## 2015-09-14 ENCOUNTER — Other Ambulatory Visit: Payer: Self-pay | Admitting: Family Medicine

## 2015-10-02 ENCOUNTER — Other Ambulatory Visit: Payer: Self-pay | Admitting: Family Medicine

## 2015-10-24 ENCOUNTER — Other Ambulatory Visit: Payer: Self-pay | Admitting: Family Medicine

## 2015-12-07 ENCOUNTER — Other Ambulatory Visit: Payer: Self-pay | Admitting: Family Medicine

## 2015-12-16 ENCOUNTER — Other Ambulatory Visit: Payer: Self-pay | Admitting: Family Medicine

## 2015-12-30 ENCOUNTER — Other Ambulatory Visit: Payer: Self-pay | Admitting: Family Medicine

## 2016-02-09 ENCOUNTER — Other Ambulatory Visit: Payer: Self-pay | Admitting: Family Medicine

## 2016-02-16 ENCOUNTER — Other Ambulatory Visit: Payer: Self-pay | Admitting: Family Medicine

## 2016-03-04 ENCOUNTER — Other Ambulatory Visit: Payer: Self-pay | Admitting: Emergency Medicine

## 2016-03-04 MED ORDER — ATORVASTATIN CALCIUM 40 MG PO TABS: ORAL_TABLET | ORAL | Status: DC

## 2016-03-06 ENCOUNTER — Other Ambulatory Visit: Payer: Self-pay | Admitting: Family Medicine

## 2016-03-11 NOTE — Telephone Encounter (Signed)
Refill sent to pharmacy.   

## 2016-04-08 ENCOUNTER — Other Ambulatory Visit: Payer: Self-pay | Admitting: Emergency Medicine

## 2016-04-08 MED ORDER — ATORVASTATIN CALCIUM 40 MG PO TABS: ORAL_TABLET | ORAL | 1 refills | Status: DC

## 2016-04-17 ENCOUNTER — Other Ambulatory Visit: Payer: Self-pay | Admitting: Family Medicine

## 2016-04-28 ENCOUNTER — Other Ambulatory Visit (INDEPENDENT_AMBULATORY_CARE_PROVIDER_SITE_OTHER): Payer: 59

## 2016-04-28 DIAGNOSIS — R7989 Other specified abnormal findings of blood chemistry: Secondary | ICD-10-CM | POA: Diagnosis not present

## 2016-04-28 DIAGNOSIS — Z0000 Encounter for general adult medical examination without abnormal findings: Secondary | ICD-10-CM

## 2016-04-28 LAB — POC URINALSYSI DIPSTICK (AUTOMATED)
Bilirubin, UA: NEGATIVE
Glucose, UA: NEGATIVE
KETONES UA: NEGATIVE
LEUKOCYTES UA: NEGATIVE
Nitrite, UA: NEGATIVE
PH UA: 5.5
PROTEIN UA: NEGATIVE
RBC UA: NEGATIVE
Spec Grav, UA: 1.025
Urobilinogen, UA: 0.2

## 2016-04-28 LAB — HEPATIC FUNCTION PANEL
ALBUMIN: 4 g/dL (ref 3.5–5.2)
ALT: 28 U/L (ref 0–53)
AST: 18 U/L (ref 0–37)
Alkaline Phosphatase: 54 U/L (ref 39–117)
BILIRUBIN TOTAL: 0.6 mg/dL (ref 0.2–1.2)
Bilirubin, Direct: 0.1 mg/dL (ref 0.0–0.3)
Total Protein: 6.2 g/dL (ref 6.0–8.3)

## 2016-04-28 LAB — MICROALBUMIN / CREATININE URINE RATIO
CREATININE, U: 193 mg/dL
Microalb Creat Ratio: 0.4 mg/g (ref 0.0–30.0)
Microalb, Ur: <0.7 mg/dL (ref 0.0–1.9)

## 2016-04-28 LAB — BASIC METABOLIC PANEL
BUN: 17 mg/dL (ref 6–23)
CHLORIDE: 103 meq/L (ref 96–112)
CO2: 28 mEq/L (ref 19–32)
Calcium: 8.5 mg/dL (ref 8.4–10.5)
Creatinine, Ser: 0.9 mg/dL (ref 0.40–1.50)
GFR: 107.7 mL/min (ref >60.00)
GLUCOSE: 141 mg/dL — AB (ref 70–99)
POTASSIUM: 3.5 meq/L (ref 3.5–5.1)
SODIUM: 140 meq/L (ref 135–145)

## 2016-04-28 LAB — CBC WITH DIFFERENTIAL/PLATELET
BASOS PCT: 1 % (ref 0.0–3.0)
Basophils Absolute: 0 10*3/uL (ref 0.0–0.1)
EOS PCT: 2.7 % (ref 0.0–5.0)
Eosinophils Absolute: 0.1 10*3/uL (ref 0.0–0.7)
HCT: 35.7 % — ABNORMAL LOW (ref 39.0–52.0)
HEMOGLOBIN: 11.9 g/dL — AB (ref 13.0–17.0)
LYMPHS ABS: 1.8 10*3/uL (ref 0.7–4.0)
Lymphocytes Relative: 38.3 % (ref 12.0–46.0)
MCHC: 33.5 g/dL (ref 30.0–36.0)
MCV: 83 fl (ref 78.0–100.0)
MONO ABS: 0.3 10*3/uL (ref 0.1–1.0)
MONOS PCT: 7.4 % (ref 3.0–12.0)
Neutro Abs: 2.3 10*3/uL (ref 1.4–7.7)
Neutrophils Relative %: 50.6 % (ref 43.0–77.0)
Platelets: 189 10*3/uL (ref 150.0–400.0)
RBC: 4.3 Mil/uL (ref 4.22–5.81)
RDW: 14.8 % (ref 11.5–15.5)
WBC: 4.6 10*3/uL (ref 4.0–10.5)

## 2016-04-28 LAB — HEMOGLOBIN A1C: Hgb A1c MFr Bld: 6.7 % — ABNORMAL HIGH (ref 4.6–6.5)

## 2016-04-28 LAB — LIPID PANEL
CHOL/HDL RATIO: 4
CHOLESTEROL: 213 mg/dL — AB (ref 0–200)
HDL: 53.3 mg/dL (ref >39.00)
NonHDL: 159.58
Triglycerides: 237 mg/dL — ABNORMAL HIGH (ref 0.0–149.0)
VLDL: 47.4 mg/dL — ABNORMAL HIGH (ref 0.0–40.0)

## 2016-04-28 LAB — LDL CHOLESTEROL, DIRECT: Direct LDL: 105 mg/dL

## 2016-04-28 LAB — PSA: PSA: 70.13 ng/mL — AB (ref 0.10–4.00)

## 2016-04-28 LAB — TSH: TSH: 13.33 u[IU]/mL — ABNORMAL HIGH (ref 0.35–4.50)

## 2016-05-03 ENCOUNTER — Encounter: Payer: Self-pay | Admitting: Family Medicine

## 2016-05-03 ENCOUNTER — Ambulatory Visit (INDEPENDENT_AMBULATORY_CARE_PROVIDER_SITE_OTHER): Payer: 59 | Admitting: Family Medicine

## 2016-05-03 VITALS — BP 110/70 | HR 73 | Temp 98.2°F | Ht 66.75 in | Wt 223.0 lb

## 2016-05-03 DIAGNOSIS — R972 Elevated prostate specific antigen [PSA]: Secondary | ICD-10-CM | POA: Diagnosis not present

## 2016-05-03 DIAGNOSIS — Z0000 Encounter for general adult medical examination without abnormal findings: Secondary | ICD-10-CM

## 2016-05-03 DIAGNOSIS — E039 Hypothyroidism, unspecified: Secondary | ICD-10-CM

## 2016-05-03 DIAGNOSIS — Z23 Encounter for immunization: Secondary | ICD-10-CM | POA: Diagnosis not present

## 2016-05-03 DIAGNOSIS — E785 Hyperlipidemia, unspecified: Secondary | ICD-10-CM

## 2016-05-03 DIAGNOSIS — I10 Essential (primary) hypertension: Secondary | ICD-10-CM

## 2016-05-03 DIAGNOSIS — E119 Type 2 diabetes mellitus without complications: Secondary | ICD-10-CM

## 2016-05-03 MED ORDER — ATORVASTATIN CALCIUM 40 MG PO TABS: ORAL_TABLET | ORAL | 3 refills | Status: DC

## 2016-05-03 MED ORDER — DOXAZOSIN MESYLATE 8 MG PO TABS: ORAL_TABLET | ORAL | 3 refills | Status: DC

## 2016-05-03 MED ORDER — NIFEDIPINE ER OSMOTIC RELEASE 60 MG PO TB24: ORAL_TABLET | ORAL | 3 refills | Status: DC

## 2016-05-03 MED ORDER — LEVOTHYROXINE SODIUM 150 MCG PO TABS: ORAL_TABLET | ORAL | 3 refills | Status: DC

## 2016-05-03 MED ORDER — FOSINOPRIL SODIUM 40 MG PO TABS: 80.0000 mg | ORAL_TABLET | Freq: Every day | ORAL | 3 refills | Status: DC

## 2016-05-03 MED ORDER — GLIPIZIDE ER 10 MG PO TB24: ORAL_TABLET | ORAL | 3 refills | Status: DC

## 2016-05-03 MED ORDER — FUROSEMIDE 40 MG PO TABS: 80.0000 mg | ORAL_TABLET | Freq: Every morning | ORAL | 3 refills | Status: DC

## 2016-05-03 MED ORDER — LABETALOL HCL 200 MG PO TABS: ORAL_TABLET | ORAL | 2 refills | Status: DC

## 2016-05-03 MED ORDER — CLONIDINE HCL 0.2 MG PO TABS: ORAL_TABLET | ORAL | 3 refills | Status: DC

## 2016-05-03 MED ORDER — METFORMIN HCL 1000 MG PO TABS: ORAL_TABLET | ORAL | 3 refills | Status: DC

## 2016-05-03 MED ORDER — POTASSIUM CHLORIDE CRYS ER 20 MEQ PO TBCR: EXTENDED_RELEASE_TABLET | ORAL | 3 refills | Status: DC

## 2016-05-03 NOTE — Progress Notes (Signed)
Pre visit review using our clinic review tool, if applicable. No additional management support is needed unless otherwise documented below in the visit note. 

## 2016-05-03 NOTE — Progress Notes (Signed)
Matthew Serrano is a 70 year old male nonsmoker who comes in today for evaluation because of a history of hypertension, diabetes type 2, obesity class I, hyperlipidemia, elevated PSA, hypothyroidism, anemia of unknown etiology  His blood pressure on his current medication is 110/70  His blood sugars in the 139 range with an A1c of 6.7%  2 years ago his PSA was 11 he was advised by his oncologist who reviewed and seeing for the anemia to see the urologist. He states he went to the urologist but they treated him for infection in his testicle and did not address the PSA issue. PSA were most recently is up to 64.  Renal function normal with a creatinine of 0.9 and GFR of 107.  Lipids on Lipitor 40 mg daily and a baby aspirin showed HDL 53 LDL 105. This is not ideal. He states he's not taking his medicine on a regular basis.  He does not get routine eye care. Because of his diabetes recommend he see an ophthalmologist yearly. He has many broken teeth he is due to see his dentist next week for more definitive treatment. He had a colonoscopy 2017 was normal  Vaccinations she was given a flu shot and Pneumovax 23 today.  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 He's been in the 57 the past to get a shingles vaccine but he has not done that.  Review of systems otherwise negative  Physical examination vital signs stable he's afebrile except for BMI of 31.75.  HEENT were negative except for bilateral cataracts. Says his distance vision is normal and his has no trouble seeing and driving at night. Neck was supple no adenopathy thyroid normal no carotid bruits cardiopulmonary exam normal exam. Abdominal exam normal except for fairly large panniculus. Genitorectal exam deferred because he is going to go see the urologist. Extremities normal skin normal peripheral pulses normal no neuropathy  Impression  #1...  Hypertension at goal continue current therapy  #2..... Diabetes type 2 at goal continue current therapy  #3 obesity........ again as we have in the past discussed diet exercise and weight loss  #4 hyperlipidemia.......... advised take his medicine nightly  #5 elevated PSA........Marland Kitchen refer to urology ASAP  #6 hypothyroidism........... continue Synthroid  #7. Bilateral cataracts......... asymptomatic however referred back to ophthalmologist for diabetic eye exam

## 2016-05-03 NOTE — Patient Instructions (Addendum)
Continue your current medications.  We've called in a consult to urology center because of your elevated PSA. Also had like you to call them ASAP  Follow-up with your dentist as we discussed  Since your blood sugar is normal then you need to come back for follow-up in 6 months.

## 2016-06-24 ENCOUNTER — Other Ambulatory Visit: Payer: Self-pay | Admitting: Family Medicine

## 2016-09-28 ENCOUNTER — Other Ambulatory Visit: Payer: Self-pay | Admitting: Family Medicine

## 2017-01-28 ENCOUNTER — Other Ambulatory Visit: Payer: Self-pay | Admitting: Family Medicine

## 2017-01-28 NOTE — Telephone Encounter (Signed)
Left a message for a return call.  Needs to be scheduled for follow up A1C.

## 2017-01-31 ENCOUNTER — Telehealth: Payer: Self-pay | Admitting: Family Medicine

## 2017-01-31 NOTE — Telephone Encounter (Signed)
Sent to the pharmacy by e-scribe for 90 days.  Message sent to scheduling to help pt make a follow up appt for diabetes.  Now past due.

## 2017-01-31 NOTE — Telephone Encounter (Signed)
lmom for pt to call back

## 2017-01-31 NOTE — Telephone Encounter (Signed)
Pt needs a follow up for his diabetes.  Please schedule with another provider.  Thanks!!

## 2017-02-01 NOTE — Telephone Encounter (Signed)
Pt has been scheduled 02/10/17 at 7:30 am

## 2017-02-10 ENCOUNTER — Ambulatory Visit (INDEPENDENT_AMBULATORY_CARE_PROVIDER_SITE_OTHER): Payer: 59 | Admitting: Adult Health

## 2017-02-10 ENCOUNTER — Encounter: Payer: Self-pay | Admitting: Adult Health

## 2017-02-10 VITALS — BP 152/70 | Temp 98.4°F | Ht 66.75 in | Wt 225.0 lb

## 2017-02-10 DIAGNOSIS — R011 Cardiac murmur, unspecified: Secondary | ICD-10-CM

## 2017-02-10 DIAGNOSIS — E119 Type 2 diabetes mellitus without complications: Secondary | ICD-10-CM | POA: Diagnosis not present

## 2017-02-10 DIAGNOSIS — I10 Essential (primary) hypertension: Secondary | ICD-10-CM | POA: Diagnosis not present

## 2017-02-10 DIAGNOSIS — E039 Hypothyroidism, unspecified: Secondary | ICD-10-CM | POA: Diagnosis not present

## 2017-02-10 DIAGNOSIS — Z7689 Persons encountering health services in other specified circumstances: Secondary | ICD-10-CM | POA: Diagnosis not present

## 2017-02-10 DIAGNOSIS — R972 Elevated prostate specific antigen [PSA]: Secondary | ICD-10-CM

## 2017-02-10 LAB — POCT GLYCOSYLATED HEMOGLOBIN (HGB A1C): Hemoglobin A1C: 6.3

## 2017-02-10 NOTE — Patient Instructions (Addendum)
It was great seeing you today   I have made a referral to Urology - please go to this appointment so you can be evaluated further.   Your A1c is 6.3   Follow up with me in 3 months for your yearly physical

## 2017-02-10 NOTE — Progress Notes (Signed)
Patient presents to clinic today to establish care. He is a pleasant 71 year old male who  has a past medical history of DIABETES MELLITUS, TYPE II (07/06/2007); ED (erectile dysfunction); Elevated PSA (01/20/2012); HYPERLIPIDEMIA (07/06/2007); HYPERTENSION (07/06/2007); HYPOTHYROIDISM (07/06/2007); and Obesity.   Acute Concerns: Establish Care   Chronic Issues: DM - He takes Metformin 1000 mg in the am and 500mg  in the pm as well as Glipizide ER 10 mg. His last A1c was 6.7 in 09.2017   Hypertension - he takes Clonidine 0.2, Monopril 80 mg, procardia 40mg , labatelol 200 mg TID,  and lasix 40 mg. Today in the office his BP was 152/70 - he did not take his meds  Hypothyroidism - Controlled with Synthroid 167mcg daily   Elevated PSA - He was referred to Urology during his last visit due to PSA of 70. He reports that he did not go to this appointment   Health Maintenance: Dental -- Does routine care Vision -- Does do routine care Immunizations -- UTD Colonoscopy -- 2017     Past Medical History:  Diagnosis Date  . DIABETES MELLITUS, TYPE II 07/06/2007  . ED (erectile dysfunction)   . Elevated PSA 01/20/2012  . HYPERLIPIDEMIA 07/06/2007  . HYPERTENSION 07/06/2007  . HYPOTHYROIDISM 07/06/2007  . Obesity     Past Surgical History:  Procedure Laterality Date  . COLONOSCOPY W/ BIOPSIES AND POLYPECTOMY      Current Outpatient Prescriptions on File Prior to Visit  Medication Sig Dispense Refill  . aspirin EC 81 MG tablet Take 81 mg by mouth daily.    Marland Kitchen atorvastatin (LIPITOR) 40 MG tablet TAKE 1 TABLET (40 MG TOTAL) BY MOUTH DAILY. 90 tablet 3  . cloNIDine (CATAPRES) 0.2 MG tablet TAKE 2 TABLETS BY MOUTH EVERY MORNING AND 2 TABLETS EVERY EVENING 360 tablet 3  . doxazosin (CARDURA) 8 MG tablet TAKE 1 TABLET (8 MG TOTAL) BY MOUTH AT BEDTIME. 90 tablet 3  . fosinopril (MONOPRIL) 40 MG tablet Take 2 tablets (80 mg total) by mouth daily. 200 tablet 3  . furosemide (LASIX) 40 MG tablet  Take 2 tablets (80 mg total) by mouth every morning. 200 tablet 3  . glipiZIDE (GLUCOTROL XL) 10 MG 24 hr tablet TAKE 1 TABLET (10 MG TOTAL) BY MOUTH 2 (TWO) TIMES DAILY. 180 tablet 3  . labetalol (NORMODYNE) 200 MG tablet TAKE 3 TABLETS BY MOUTH EVERY MORNING AND 2 EVERY EVENING 450 tablet 0  . levothyroxine (SYNTHROID, LEVOTHROID) 150 MCG tablet TAKE 1 TABLET (150 MCG TOTAL) BY MOUTH DAILY. 100 tablet 3  . levothyroxine (SYNTHROID, LEVOTHROID) 150 MCG tablet TAKE 1 TABLET (150 MCG TOTAL) BY MOUTH DAILY. 100 tablet 4  . metFORMIN (GLUCOPHAGE) 1000 MG tablet TAKE 1 TABLET BEFORE BREAKFAST AND A HALF A TABLET BEFORE EVENING MEAL 180 tablet 3  . NIFEdipine (PROCARDIA XL/ADALAT-CC) 60 MG 24 hr tablet TAKE 1 TABLET (60 MG TOTAL) BY MOUTH DAILY. 90 tablet 3  . potassium chloride SA (KLOR-CON M20) 20 MEQ tablet TAKE 1 TABLET BY MOUTH TWICE A DAY 200 tablet 3  . sildenafil (VIAGRA) 50 MG tablet Take 1 tablet (50 mg total) by mouth daily as needed. 10 tablet 10   No current facility-administered medications on file prior to visit.     No Known Allergies  Family History  Problem Relation Age of Onset  . Hypertension Other   . Breast cancer Sister   . Breast cancer Unknown        family history  .  Breast cancer Brother        prostate ca  . Aneurysm Mother     Social History   Social History  . Marital status: Widowed    Spouse name: N/A  . Number of children: N/A  . Years of education: N/A   Occupational History  . Not on file.   Social History Main Topics  . Smoking status: Former Research scientist (life sciences)  . Smokeless tobacco: Never Used     Comment: quit 2005  . Alcohol use 12.6 oz/week    21 Cans of beer per week  . Drug use: No  . Sexual activity: Not on file   Other Topics Concern  . Not on file   Social History Narrative   He works as a Freight forwarder    Not married    No kids              Review of Systems  Constitutional: Negative.   HENT: Negative.   Eyes: Negative.     Respiratory: Positive for shortness of breath (with exertion on occassion ).   Cardiovascular: Negative.   Genitourinary: Negative.   Musculoskeletal: Negative.   Skin: Negative.   Neurological: Negative.   Psychiatric/Behavioral: Negative.   All other systems reviewed and are negative.   BP (!) 152/70 (BP Location: Left Arm, Patient Position: Sitting, Cuff Size: Normal)   Temp 98.4 F (36.9 C) (Oral)   Ht 5' 6.75" (1.695 m)   Wt 225 lb (102.1 kg)   BMI 35.50 kg/m   Physical Exam  Constitutional: He is oriented to person, place, and time and well-developed, well-nourished, and in no distress. No distress.  HENT:  Mouth/Throat: Uvula is midline. He has dentures. Abnormal dentition.  Eyes: Conjunctivae and EOM are normal. Pupils are equal, round, and reactive to light. Right eye exhibits no discharge. Left eye exhibits no discharge.  Cardiovascular: Normal rate, regular rhythm and intact distal pulses.  Exam reveals no gallop and no friction rub.   Murmur heard. Pulmonary/Chest: Effort normal and breath sounds normal. No respiratory distress. He has no wheezes. He has no rales. He exhibits no tenderness.  Neurological: He is alert and oriented to person, place, and time. Gait normal. GCS score is 15.  Skin: Skin is warm and dry. No rash noted. He is not diaphoretic. No erythema. No pallor.  Psychiatric: Memory, affect and judgment normal.  Nursing note and vitals reviewed.   Recent Results (from the past 2160 hour(s))  POC HgB A1c     Status: None   Collection Time: 02/10/17  7:54 AM  Result Value Ref Range   Hemoglobin A1C 6.3     Assessment/Plan: 1. Encounter to establish care - Follow up for CPE - Continue to work on diet  - needs to lose weight  - Cut back on drinking   2. Essential hypertension - Not controlled today in the office but has been in the past - No change in medication  - educated on the importance of regualar exercise   3. Type 2 diabetes mellitus  without complication, without long-term current use of insulin (HCC) - POC HgB A1c - 6.3 - has improved!  4. Hypothyroidism, unspecified type - Controlled with Synthroid   5. Elevated PSA  - Ambulatory referral to Urology  6. Heart murmur - Unknown if this a new murmur. Do not see documentation of this. Due to SOB with exertion I am going to send him to Cardiology for further evaluation  - Ambulatory referral to  Cardiology   [name], NP

## 2017-03-15 ENCOUNTER — Encounter (INDEPENDENT_AMBULATORY_CARE_PROVIDER_SITE_OTHER): Payer: Self-pay

## 2017-03-15 ENCOUNTER — Encounter: Payer: Self-pay | Admitting: Physician Assistant

## 2017-03-15 ENCOUNTER — Ambulatory Visit (INDEPENDENT_AMBULATORY_CARE_PROVIDER_SITE_OTHER): Payer: 59 | Admitting: Physician Assistant

## 2017-03-15 VITALS — BP 106/60 | HR 53 | Ht 66.75 in | Wt 223.0 lb

## 2017-03-15 DIAGNOSIS — E119 Type 2 diabetes mellitus without complications: Secondary | ICD-10-CM | POA: Diagnosis not present

## 2017-03-15 DIAGNOSIS — M79604 Pain in right leg: Secondary | ICD-10-CM

## 2017-03-15 DIAGNOSIS — E785 Hyperlipidemia, unspecified: Secondary | ICD-10-CM

## 2017-03-15 DIAGNOSIS — R011 Cardiac murmur, unspecified: Secondary | ICD-10-CM

## 2017-03-15 DIAGNOSIS — R0602 Shortness of breath: Secondary | ICD-10-CM

## 2017-03-15 DIAGNOSIS — I10 Essential (primary) hypertension: Secondary | ICD-10-CM | POA: Diagnosis not present

## 2017-03-15 NOTE — Progress Notes (Signed)
Cardiology Office Note:    Date:  03/15/2017   ID:  Matthew Serrano, DOB 29-Mar-1946, MRN [id number]  PCP:  [name], NP  Cardiologist:  New - Dr. [name]    Referring MD: [name], NP   Chief Complaint  Patient presents with  . Heart Murmur  . Shortness of Breath    History of Present Illness:    Matthew Serrano is a 71 y.o. male with a hx of diabetes, HTN, HL, hypothyroidism, erectile dysfunction who is being seen today for the evaluation of heart murmur at the request of [name], NP.   He was previously followed by Dr. Aundra Dubin. Last seen in 2013. He was followed for difficult to control hypertension. Renal artery duplex in 2013 and abdominal MRA were negative for renal artery stenosis. Echocardiogram in 2013 demonstrated normal LV function with mild diastolic dysfunction.  Matthew Serrano was recently evaluated by his PCP and noted to have a murmur on exam.  He also complained of shortness of breath.  Matthew Serrano tells me he has noted dyspnea on exertion over the past 8 mos. He does not feel that it is worsening.  He has not had chest pain.  He has not had orthopnea, paroxysmal nocturnal dyspnea, syncope.  He denies significant LE edema.  He does have some R thigh pain with activity that resolves with rest.  He denies any rest pain or non-healing ulcers.   PAD Screen 03/15/2017  Previous PAD dx? No  Previous surgical procedure? No  Pain with walking? Yes  Subsides with rest? Yes  Feet/toe relief with dangling? No  Painful, non-healing ulcers? No  Extremities discolored? No    Prior CV studies:   The following studies were reviewed today:  Abdominal MRA 8/13 1.  Negative for renal artery stenosis or other lesion to suggest renovascular component of hypertension.  Echo 8/13 Mild LVH, EF 55-60, no RWMA, Gr 1 DD, mild LAE  Renal Art Duplex 6/13 No RAS; normal caliber abd aorta  Past Medical History:  Diagnosis Date  . DIABETES MELLITUS, TYPE II 07/06/2007   . ED (erectile dysfunction)   . Elevated PSA 01/20/2012  . HYPERLIPIDEMIA 07/06/2007  . HYPERTENSION 07/06/2007  . HYPOTHYROIDISM 07/06/2007  . Obesity     Past Surgical History:  Procedure Laterality Date  . COLONOSCOPY W/ BIOPSIES AND POLYPECTOMY      Current Medications: Current Meds  Medication Sig  . aspirin EC 81 MG tablet Take 81 mg by mouth daily.  Marland Kitchen atorvastatin (LIPITOR) 40 MG tablet TAKE 1 TABLET (40 MG TOTAL) BY MOUTH DAILY.  . cloNIDine (CATAPRES) 0.2 MG tablet TAKE 2 TABLETS BY MOUTH EVERY MORNING AND 2 TABLETS EVERY EVENING  . doxazosin (CARDURA) 8 MG tablet TAKE 1 TABLET (8 MG TOTAL) BY MOUTH AT BEDTIME.  . fosinopril (MONOPRIL) 40 MG tablet Take 2 tablets (80 mg total) by mouth daily.  . furosemide (LASIX) 40 MG tablet Take 2 tablets (80 mg total) by mouth every morning.  Marland Kitchen glipiZIDE (GLUCOTROL XL) 10 MG 24 hr tablet TAKE 1 TABLET (10 MG TOTAL) BY MOUTH 2 (TWO) TIMES DAILY.  Marland Kitchen labetalol (NORMODYNE) 200 MG tablet TAKE 3 TABLETS BY MOUTH EVERY MORNING AND 2 EVERY EVENING  . levothyroxine (SYNTHROID, LEVOTHROID) 150 MCG tablet TAKE 1 TABLET (150 MCG TOTAL) BY MOUTH DAILY.  . metFORMIN (GLUCOPHAGE) 1000 MG tablet TAKE 1 TABLET BEFORE BREAKFAST AND A HALF A TABLET BEFORE EVENING MEAL  . NIFEdipine (PROCARDIA XL/ADALAT-CC) 60 MG 24 hr  tablet TAKE 1 TABLET (60 MG TOTAL) BY MOUTH DAILY.  Marland Kitchen potassium chloride SA (KLOR-CON M20) 20 MEQ tablet TAKE 1 TABLET BY MOUTH TWICE A DAY  . sildenafil (VIAGRA) 50 MG tablet Take 50 mg by mouth daily as needed for erectile dysfunction.     Allergies:   Patient has no known allergies.   Social History   Social History  . Marital status: Widowed    Spouse name: N/A  . Number of children: N/A  . Years of education: N/A   Social History Main Topics  . Smoking status: Former Research scientist (life sciences)  . Smokeless tobacco: Never Used     Comment: quit 2005  . Alcohol use 12.6 oz/week    21 Cans of beer per week  . Drug use: No  . Sexual activity:  Not Asked   Other Topics Concern  . None   Social History Narrative   He works as a Freight forwarder    Not married    No kids               Family Hx: The patient's family history includes Aneurysm in his mother; Breast cancer in his brother, sister, and unknown relative; Hypertension in his other.  ROS:   Please see the history of present illness.    Review of Systems  Musculoskeletal:       Leg Pain on R with activity   All other systems reviewed and are negative.   EKGs/Labs/Other Test Reviewed:    EKG:  EKG is  ordered today.  The ekg ordered today demonstrates Sinus bradycardia, HR 53, nonspecific ST-T wave changes, septal Q waves, QTc 392 ms  Recent Labs: 04/28/2016: ALT 28; BUN 17; Creatinine, Ser 0.90; Hemoglobin 11.9; Platelets 189.0; Potassium 3.5; Sodium 140; TSH 13.33   Recent Lipid Panel Lab Results  Component Value Date/Time   CHOL 213 (H) 04/28/2016 07:56 AM   TRIG 237.0 (H) 04/28/2016 07:56 AM   HDL 53.30 04/28/2016 07:56 AM   CHOLHDL 4 04/28/2016 07:56 AM   LDLCALC 88 11/02/2013 08:54 AM   LDLDIRECT 105.0 04/28/2016 07:56 AM    Physical Exam:    VS:  BP 106/60 (BP Location: Left Arm, Patient Position: Sitting, Cuff Size: Large)   Pulse (!) 53   Ht 5' 6.75" (1.695 m)   Wt 223 lb (101.2 kg)   BMI 35.19 kg/m     Wt Readings from Last 3 Encounters:  03/15/17 223 lb (101.2 kg)  02/10/17 225 lb (102.1 kg)  05/03/16 223 lb (101.2 kg)     Physical Exam  Constitutional: He is oriented to person, place, and time. He appears well-developed and well-nourished. No distress.  HENT:  Head: Normocephalic and atraumatic.  Eyes: No scleral icterus.  Neck: No JVD present. Carotid bruit is not present.  Cardiovascular: Normal rate, regular rhythm, S1 normal and S2 normal.   Murmur heard.  Medium-pitched early systolic murmur is present with a grade of 2/6  at the upper right sternal border Pulses:      Dorsalis pedis pulses are 1+ on the right side,  and 1+ on the left side.       Posterior tibial pulses are 1+ on the right side, and 1+ on the left side.  Pulmonary/Chest: Effort normal. He has no wheezes. He has no rales.  Abdominal: Soft. He exhibits no mass.  Musculoskeletal: He exhibits no edema.  Neurological: He is alert and oriented to person, place, and time.  Skin: Skin is warm and  dry.  Psychiatric: He has a normal mood and affect.    ASSESSMENT:    1. Shortness of breath   2. Murmur   3. Pain of right lower extremity   4. Essential hypertension   5. Hyperlipidemia, unspecified hyperlipidemia type   6. Type 2 diabetes mellitus without complication, without long-term current use of insulin (HCC)    PLAN:    In order of problems listed above:  1. Shortness of breath -  Etiology not entirely clear.  He has a benign sounding murmur on exam.  I suspect his obesity is likely playing a role in his dyspnea.  However, he is a diabetic and a prior smoker.  He is at risk for obstructive CAD.  His ECG does not show any acute findings.  He has not had an assessment for ischemia.  -  Obtain echocardiogram   -  Obtain ETT-Myoview  -  FU with me after testing in 6-8 weeks.   2. Murmur -  His murmur is likely related to aortic sclerosis. Will get an echocardiogram to further evaluate.  3. Pain of right lower extremity - He has some symptoms that sound suspicious for claudication on the R.  His pulses are diminished.  Plan ABIs/arterial dopplers.  4. Essential hypertension The patient's blood pressure is controlled on his current regimen.  Continue current therapy.   5. Hyperlipidemia, unspecified hyperlipidemia type Managed by PCP  6. Type 2 diabetes mellitus without complication, without long-term current use of insulin (HCC) A1c 6.3 in 6/18.  Continue follow up with PCP.  Dispo:  Return in about 8 weeks (around 05/10/2017) for Follow up after testing, w/ Richardson Dopp, PA-C.   Medication Adjustments/Labs and Tests  Ordered: Current medicines are reviewed at length with the patient today.  Concerns regarding medicines are outlined above.  Orders/Tests:  Orders Placed This Encounter  Procedures  . Myocardial Perfusion Imaging  . EKG 12-Lead  . ECHOCARDIOGRAM COMPLETE   Medication changes: No orders of the defined types were placed in this encounter.  Signed, Richardson Dopp, PA-C  03/15/2017 10:41 AM    [facility] [place], [place], [place]  [address] Phone: [phone]; Fax: [phone]

## 2017-03-15 NOTE — Patient Instructions (Signed)
Medication Instructions:  Your physician recommends that you continue on your current medications as directed. Please refer to the Current Medication list given to you today.   Labwork: NONE ORDERED TODAY  Testing/Procedures: 1. Your physician has requested that you have an echocardiogram. Echocardiography is a painless test that uses sound waves to create images of your heart. It provides your doctor with information about the size and shape of your heart and how well your heart's chambers and valves are working. This procedure takes approximately one hour. There are no restrictions for this procedure.  2. Your physician has requested that you have a lower extremity arterial exercise duplex THIS IS TO BE DONE WITH ABI'S. During this test, exercise and ultrasound are used to evaluate arterial blood flow in the legs. Allow one hour for this exam. There are no restrictions or special instructions.   3. Your physician has requested that you have en exercise stress myoview. For further information please visit [email or web address]. Please follow instruction sheet, as given.    Follow-Up: SCOTT WEAVER, PAC 6-8 WEEKS   Any Other Special Instructions Will Be Listed Below (If Applicable).     If you need a refill on your cardiac medications before your next appointment, please call your pharmacy.

## 2017-03-16 ENCOUNTER — Other Ambulatory Visit: Payer: Self-pay | Admitting: Physician Assistant

## 2017-03-16 DIAGNOSIS — I739 Peripheral vascular disease, unspecified: Secondary | ICD-10-CM

## 2017-03-22 ENCOUNTER — Telehealth (HOSPITAL_COMMUNITY): Payer: Self-pay | Admitting: *Deleted

## 2017-03-22 NOTE — Telephone Encounter (Signed)
Left message on voicemail per DPR in reference to upcoming appointment scheduled on  03/25/17 with detailed instructions given per Myocardial Perfusion Study Information Sheet for the test. LM to arrive 15 minutes early, and that it is imperative to arrive on time for appointment to keep from having the test rescheduled. If you need to cancel or reschedule your appointment, please call the office within 24 hours of your appointment. Failure to do so may result in a cancellation of your appointment, and a $50 no show fee. Phone number given for call back for any questions. Dale Strausser Jacqueline    

## 2017-03-25 ENCOUNTER — Encounter: Payer: Self-pay | Admitting: Physician Assistant

## 2017-03-25 ENCOUNTER — Telehealth: Payer: Self-pay | Admitting: *Deleted

## 2017-03-25 ENCOUNTER — Ambulatory Visit (HOSPITAL_COMMUNITY): Payer: 59 | Attending: Cardiology

## 2017-03-25 ENCOUNTER — Other Ambulatory Visit: Payer: Self-pay

## 2017-03-25 ENCOUNTER — Ambulatory Visit (HOSPITAL_BASED_OUTPATIENT_CLINIC_OR_DEPARTMENT_OTHER): Payer: 59

## 2017-03-25 DIAGNOSIS — R011 Cardiac murmur, unspecified: Secondary | ICD-10-CM | POA: Insufficient documentation

## 2017-03-25 DIAGNOSIS — R0602 Shortness of breath: Secondary | ICD-10-CM | POA: Diagnosis not present

## 2017-03-25 DIAGNOSIS — I071 Rheumatic tricuspid insufficiency: Secondary | ICD-10-CM | POA: Insufficient documentation

## 2017-03-25 DIAGNOSIS — R9439 Abnormal result of other cardiovascular function study: Secondary | ICD-10-CM | POA: Insufficient documentation

## 2017-03-25 DIAGNOSIS — I350 Nonrheumatic aortic (valve) stenosis: Secondary | ICD-10-CM | POA: Insufficient documentation

## 2017-03-25 LAB — MYOCARDIAL PERFUSION IMAGING
CHL CUP MPHR: 151 {beats}/min
CHL CUP NUCLEAR SDS: 2
CHL CUP NUCLEAR SRS: 1
CSEPED: 4 min
CSEPHR: 99 %
CSEPPHR: 150 {beats}/min
Estimated workload: 4.6 METS
Exercise duration (sec): 0 s
LHR: 0.31
LV dias vol: 110 mL (ref 62–150)
LVSYSVOL: 39 mL
Rest HR: 53 {beats}/min
SSS: 3
TID: 0.97

## 2017-03-25 MED ORDER — TECHNETIUM TC 99M TETROFOSMIN IV KIT
32.9000 | PACK | Freq: Once | INTRAVENOUS | Status: AC | PRN
  Administered 2017-03-25: 32.9 via INTRAVENOUS
  Filled 2017-03-25: qty 33

## 2017-03-25 MED ORDER — TECHNETIUM TC 99M TETROFOSMIN IV KIT
10.5000 | PACK | Freq: Once | INTRAVENOUS | Status: AC | PRN
  Administered 2017-03-25: 10.5 via INTRAVENOUS
  Filled 2017-03-25: qty 11

## 2017-03-25 NOTE — Telephone Encounter (Signed)
-----   Message from Liliane Shi, [place] sent at 03/25/2017  1:48 PM EDT ----- Please call the patient. The nuclear stress test does not demonstrate any ischemia (loss of blood flow from a blockage). Continue current treatment plan. Please fax a copy of this study result to his PCP:  [name], NP  Thanks! Richardson Dopp, PA-C    03/25/2017 1:47 PM

## 2017-03-25 NOTE — Telephone Encounter (Signed)
Pt has been notified of both Myoview and Echo by phone with verbal understanding. Pt thanked me for my call today. Confirmed both upcoming appts 8/9 for LEA and 9/19 f/u with Richardson Dopp, PA. I will forward results to [name], NP.

## 2017-03-25 NOTE — Telephone Encounter (Signed)
Lmtcb to go over Myoview results.  

## 2017-03-31 ENCOUNTER — Ambulatory Visit (HOSPITAL_COMMUNITY)
Admission: RE | Admit: 2017-03-31 | Discharge: 2017-03-31 | Disposition: A | Discharge status: Home / Self Care | Payer: 59 | Source: Ambulatory Visit | Attending: Internal Medicine | Admitting: Internal Medicine

## 2017-03-31 DIAGNOSIS — M79604 Pain in right leg: Secondary | ICD-10-CM

## 2017-03-31 DIAGNOSIS — I739 Peripheral vascular disease, unspecified: Secondary | ICD-10-CM | POA: Insufficient documentation

## 2017-03-31 DIAGNOSIS — R0989 Other specified symptoms and signs involving the circulatory and respiratory systems: Secondary | ICD-10-CM | POA: Diagnosis not present

## 2017-04-01 ENCOUNTER — Telehealth: Payer: Self-pay | Admitting: *Deleted

## 2017-04-01 ENCOUNTER — Encounter: Payer: Self-pay | Admitting: Physician Assistant

## 2017-04-01 NOTE — Telephone Encounter (Signed)
-----   Message from Liliane Shi, [place] sent at 04/01/2017  2:05 PM EDT ----- Please call the patient. The ABIs are normal.  Continue current medications and follow up as planned.  Please fax a copy of this study result to his PCP:  [name], NP  Thanks! Richardson Dopp, PA-C    04/01/2017 2:04 PM

## 2017-04-01 NOTE — Telephone Encounter (Signed)
Lmtcb to go over LEA results.

## 2017-04-19 ENCOUNTER — Encounter: Payer: Self-pay | Admitting: *Deleted

## 2017-04-26 ENCOUNTER — Other Ambulatory Visit: Payer: Self-pay | Admitting: Family Medicine

## 2017-05-02 ENCOUNTER — Other Ambulatory Visit: Payer: Self-pay | Admitting: Family Medicine

## 2017-05-09 ENCOUNTER — Ambulatory Visit (INDEPENDENT_AMBULATORY_CARE_PROVIDER_SITE_OTHER): Payer: 59 | Admitting: Physician Assistant

## 2017-05-09 ENCOUNTER — Encounter: Payer: Self-pay | Admitting: Physician Assistant

## 2017-05-09 VITALS — BP 130/70 | HR 60 | Ht 66.0 in | Wt 226.1 lb

## 2017-05-09 DIAGNOSIS — I350 Nonrheumatic aortic (valve) stenosis: Secondary | ICD-10-CM

## 2017-05-09 DIAGNOSIS — R0602 Shortness of breath: Secondary | ICD-10-CM

## 2017-05-09 NOTE — Progress Notes (Signed)
Cardiology Office Note:    Date:  05/09/2017   ID:  RANDELL TEARE, DOB [birth year], MRN [id number]  PCP:  [name], NP  Cardiologist:  Dr. [name]    Referring MD: [name], NP   Chief Complaint  Patient presents with  . Follow-up    Echo, stress test done for shortness of breath, murmur    History of Present Illness:    Matthew Serrano is a 71 y.o. male with a hx of diabetes, HTN, HL, hypothyroidism, erectile dysfunction.  He was evaluated in July for heart murmur and shortness of breath.  He was previously followed by Dr. Aundra Dubin for difficult to control hypertension. Prior testing was negative for renal artery stenosis. Last seen in 2013.   He underwent an echocardiogram which demonstrated normal LV function and mild aortic stenosis with a mean gradient of 10 mmHg. Stress testing demonstrated no ischemia. He did complain of leg pain and ABIs were obtained. These were normal.  Mr. Vahle returns for follow up. He is here alone.  He denies any chest pain, syncope, paroxysmal nocturnal dyspnea.  He notes dyspnea with more extreme activities. He denies any worsening.  He notes some edema (L>R) that is dependent and overall stable.    Prior CV studies:   The following studies were reviewed today:  ABIs 8/18:  Normal   Echocardiogram 03/25/17 Mild LVH, EF 60-65, normal wall motion, mild aortic stenosis (mean 10, peak 19), MAC, trivial MR, mild LAE  Nuclear stress test 03/25/17 Probably normal, low risk stress nuclear study with inferobasal thinning but no significant ischemia; EF 64 with normal wall motion.  Abdominal MRA 8/13 1. Negative for renal artery stenosis or other lesion to suggest renovascular component of hypertension.  Echo 8/13 Mild LVH, EF 55-60, no RWMA, Gr 1 DD, mild LAE  Renal Art Duplex 6/13 No RAS; normal caliber abd aorta  Past Medical History:  Diagnosis Date  . Aortic stenosis    Echo 8/18: mild LVH, EF 60-65, no RWMA, mild AS  (mean 10, peak 19), MAC, trivial MR, normal RVSF   . DIABETES MELLITUS, TYPE II 07/06/2007  . ED (erectile dysfunction)   . Elevated PSA 01/20/2012  . History of nuclear stress test    Myoview 8/18: EF 64, inferobasal thinning, no ischemia, low risk  . HYPERLIPIDEMIA 07/06/2007  . HYPERTENSION 07/06/2007  . HYPOTHYROIDISM 07/06/2007  . Leg pain    ABIs 8/18:  Normal   . Obesity     Past Surgical History:  Procedure Laterality Date  . COLONOSCOPY W/ BIOPSIES AND POLYPECTOMY      Current Medications: Current Meds  Medication Sig  . aspirin EC 81 MG tablet Take 81 mg by mouth daily.  Marland Kitchen atorvastatin (LIPITOR) 40 MG tablet TAKE 1 TABLET (40 MG TOTAL) BY MOUTH DAILY.  . cloNIDine (CATAPRES) 0.2 MG tablet TAKE 2 TABLETS BY MOUTH EVERY MORNING AND 2 TABLETS EVERY EVENING  . doxazosin (CARDURA) 8 MG tablet TAKE 1 TABLET (8 MG TOTAL) BY MOUTH AT BEDTIME.  . fosinopril (MONOPRIL) 40 MG tablet Take 2 tablets (80 mg total) by mouth daily.  . furosemide (LASIX) 40 MG tablet TAKE 2 TABLETS (80 MG TOTAL) BY MOUTH EVERY MORNING.  Marland Kitchen glipiZIDE (GLUCOTROL XL) 10 MG 24 hr tablet TAKE 1 TABLET (10 MG TOTAL) BY MOUTH 2 (TWO) TIMES DAILY.  Marland Kitchen labetalol (NORMODYNE) 200 MG tablet TAKE 3 TABLETS BY MOUTH EVERY MORNING AND 2 EVERY EVENING  . levothyroxine (SYNTHROID, LEVOTHROID) 150 MCG  tablet TAKE 1 TABLET (150 MCG TOTAL) BY MOUTH DAILY.  . metFORMIN (GLUCOPHAGE) 1000 MG tablet TAKE 1 TABLET BEFORE BREAKFAST AND A HALF A TABLET BEFORE EVENING MEAL  . metFORMIN (GLUCOPHAGE) 1000 MG tablet TAKE 1 TABLET BEFORE BREAKFAST AND A HALF A TABLET BEFORE EVENING MEAL  . NIFEdipine (PROCARDIA XL/ADALAT-CC) 60 MG 24 hr tablet TAKE 1 TABLET (60 MG TOTAL) BY MOUTH DAILY.  Marland Kitchen potassium chloride SA (KLOR-CON M20) 20 MEQ tablet TAKE 1 TABLET BY MOUTH TWICE A DAY  . sildenafil (VIAGRA) 50 MG tablet Take 50 mg by mouth daily as needed for erectile dysfunction.     Allergies:   Patient has no known allergies.   Social History    Social History  . Marital status: Widowed    Spouse name: N/A  . Number of children: N/A  . Years of education: N/A   Social History Main Topics  . Smoking status: Former Research scientist (life sciences)  . Smokeless tobacco: Never Used     Comment: quit 2005  . Alcohol use 12.6 oz/week    21 Cans of beer per week  . Drug use: No  . Sexual activity: Not Asked   Other Topics Concern  . None   Social History Narrative   He works as a Freight forwarder    Not married    No kids               Family Hx: The patient's family history includes Aneurysm in his mother; Breast cancer in his brother, sister, and unknown relative; Hypertension in his other.  ROS:   Please see the history of present illness.    ROS All other systems reviewed and are negative.   EKGs/Labs/Other Test Reviewed:    EKG:  EKG is  ordered today.  The ekg ordered today demonstrates NSR, HR 61, leftward axis, poor R-wave progression, QTc 440 ms, no change from prior tracing  Recent Labs: No results found for requested labs within last 8760 hours.   Recent Lipid Panel Lab Results  Component Value Date/Time   CHOL 213 (H) 04/28/2016 07:56 AM   TRIG 237.0 (H) 04/28/2016 07:56 AM   HDL 53.30 04/28/2016 07:56 AM   CHOLHDL 4 04/28/2016 07:56 AM   LDLCALC 88 11/02/2013 08:54 AM   LDLDIRECT 105.0 04/28/2016 07:56 AM    Physical Exam:    VS:  BP 130/70   Pulse 60   Ht 5' 6"  (1.676 m)   Wt 226 lb 1.9 oz (102.6 kg)   BMI 36.50 kg/m     Wt Readings from Last 3 Encounters:  05/09/17 226 lb 1.9 oz (102.6 kg)  03/15/17 223 lb (101.2 kg)  02/10/17 225 lb (102.1 kg)     Physical Exam  Constitutional: He is oriented to person, place, and time. He appears well-developed and well-nourished. No distress.  HENT:  Head: Normocephalic and atraumatic.  Neck: Normal range of motion. No JVD present.  Cardiovascular: Normal rate, regular rhythm, S1 normal and S2 normal.   Murmur heard.  Low-pitched systolic murmur is present  with a grade of 2/6  at the upper right sternal border Pulmonary/Chest: Effort normal and breath sounds normal. He has no wheezes. He has no rhonchi. He has no rales.  Abdominal: Soft. There is no hepatomegaly.  Musculoskeletal: He exhibits edema (trace ankle edema bilaterally (L>R)).  Neurological: He is alert and oriented to person, place, and time.  Skin: Skin is warm and dry.  Psychiatric: He has a normal mood and affect.  ASSESSMENT:    1. Shortness of breath   2. Nonrheumatic aortic valve stenosis    PLAN:    In order of problems listed above:  1. Shortness of breath Recent echo with normal LVF and Nuclear stress test neg for ischemia.  I suspect his dyspnea is related to deconditioning and obesity.  We discussed weight loss briefly today.  No further cardiac workup.  2. Nonrheumatic aortic valve stenosis  Mild by recent echo.  Plan repeat Echo in 1 year and FU after that.   Dispo:  Return in about 1 year (around 05/09/2018) for Routine Follow Up, w/ Richardson Dopp, PA-C.   Medication Adjustments/Labs and Tests Ordered: Current medicines are reviewed at length with the patient today.  Concerns regarding medicines are outlined above.  Tests Ordered: Orders Placed This Encounter  Procedures  . EKG 12-Lead  . ECHOCARDIOGRAM COMPLETE   Medication Changes: No orders of the defined types were placed in this encounter.   Signed, Richardson Dopp, PA-C  05/09/2017 9:50 AM    [facility] [place], [place], [place]  [address] Phone: [phone]; Fax: [phone]

## 2017-05-09 NOTE — Patient Instructions (Addendum)
Medication Instructions:  1. Your physician recommends that you continue on your current medications as directed. Please refer to the Current Medication list given to you today.   Labwork: NONE ORDERED TODAY  Testing/Procedures: 1. Your physician has requested that you have an echocardiogram. Echocardiography is a painless test that uses sound waves to create images of your heart. It provides your doctor with information about the size and shape of your heart and how well your heart's chambers and valves are working. This procedure takes approximately one hour. There are no restrictions for this procedure. THIS IS TO BE DONE 03/2018    Follow-Up: [place] 03/2018 1 WEEK AFTER ECHO HAS BEEN DONE; WE WILL SEND OUT A REMINDER LETTER FOR YOU TO CALL AND MAKE APPT   Any Other Special Instructions Will Be Listed Below (If Applicable).     If you need a refill on your cardiac medications before your next appointment, please call your pharmacy.

## 2017-05-11 ENCOUNTER — Ambulatory Visit: Payer: 59 | Admitting: Physician Assistant

## 2017-05-18 ENCOUNTER — Other Ambulatory Visit: Payer: Self-pay | Admitting: Adult Health

## 2017-05-18 ENCOUNTER — Ambulatory Visit (INDEPENDENT_AMBULATORY_CARE_PROVIDER_SITE_OTHER): Payer: 59 | Admitting: Adult Health

## 2017-05-18 ENCOUNTER — Encounter: Payer: Self-pay | Admitting: Adult Health

## 2017-05-18 VITALS — BP 140/80 | Temp 98.6°F | Ht 67.5 in | Wt 222.0 lb

## 2017-05-18 DIAGNOSIS — E119 Type 2 diabetes mellitus without complications: Secondary | ICD-10-CM

## 2017-05-18 DIAGNOSIS — E785 Hyperlipidemia, unspecified: Secondary | ICD-10-CM

## 2017-05-18 DIAGNOSIS — Z1159 Encounter for screening for other viral diseases: Secondary | ICD-10-CM | POA: Diagnosis not present

## 2017-05-18 DIAGNOSIS — R972 Elevated prostate specific antigen [PSA]: Secondary | ICD-10-CM

## 2017-05-18 DIAGNOSIS — I10 Essential (primary) hypertension: Secondary | ICD-10-CM

## 2017-05-18 DIAGNOSIS — R3914 Feeling of incomplete bladder emptying: Secondary | ICD-10-CM | POA: Diagnosis not present

## 2017-05-18 DIAGNOSIS — E039 Hypothyroidism, unspecified: Secondary | ICD-10-CM

## 2017-05-18 DIAGNOSIS — N401 Enlarged prostate with lower urinary tract symptoms: Secondary | ICD-10-CM | POA: Diagnosis not present

## 2017-05-18 DIAGNOSIS — Z0000 Encounter for general adult medical examination without abnormal findings: Secondary | ICD-10-CM

## 2017-05-18 DIAGNOSIS — Z1211 Encounter for screening for malignant neoplasm of colon: Secondary | ICD-10-CM

## 2017-05-18 DIAGNOSIS — E782 Mixed hyperlipidemia: Secondary | ICD-10-CM | POA: Diagnosis not present

## 2017-05-18 LAB — BASIC METABOLIC PANEL
BUN: 11 mg/dL (ref 6–23)
CALCIUM: 8.8 mg/dL (ref 8.4–10.5)
CHLORIDE: 105 meq/L (ref 96–112)
CO2: 28 mEq/L (ref 19–32)
CREATININE: 0.77 mg/dL (ref 0.40–1.50)
GFR: 128.54 mL/min (ref >60.00)
Glucose, Bld: 142 mg/dL — ABNORMAL HIGH (ref 70–99)
Potassium: 3.9 mEq/L (ref 3.5–5.1)
Sodium: 142 mEq/L (ref 135–145)

## 2017-05-18 LAB — LIPID PANEL
CHOLESTEROL: 157 mg/dL (ref 0–200)
HDL: 62.3 mg/dL (ref >39.00)
LDL CALC: 75 mg/dL (ref 0–99)
NonHDL: 94.2
TRIGLYCERIDES: 98 mg/dL (ref 0.0–149.0)
Total CHOL/HDL Ratio: 3
VLDL: 19.6 mg/dL (ref 0.0–40.0)

## 2017-05-18 LAB — CBC WITH DIFFERENTIAL/PLATELET
BASOS ABS: 0.1 10*3/uL (ref 0.0–0.1)
BASOS PCT: 1.1 % (ref 0.0–3.0)
EOS ABS: 0.1 10*3/uL (ref 0.0–0.7)
Eosinophils Relative: 2.7 % (ref 0.0–5.0)
HCT: 38.9 % — ABNORMAL LOW (ref 39.0–52.0)
HEMOGLOBIN: 12.6 g/dL — AB (ref 13.0–17.0)
Lymphocytes Relative: 29.7 % (ref 12.0–46.0)
Lymphs Abs: 1.5 10*3/uL (ref 0.7–4.0)
MCHC: 32.3 g/dL (ref 30.0–36.0)
MCV: 85.8 fl (ref 78.0–100.0)
MONO ABS: 0.5 10*3/uL (ref 0.1–1.0)
Monocytes Relative: 9.3 % (ref 3.0–12.0)
NEUTROS ABS: 3 10*3/uL (ref 1.4–7.7)
Neutrophils Relative %: 57.2 % (ref 43.0–77.0)
PLATELETS: 221 10*3/uL (ref 150.0–400.0)
RBC: 4.53 Mil/uL (ref 4.22–5.81)
RDW: 14.3 % (ref 11.5–15.5)
WBC: 5.2 10*3/uL (ref 4.0–10.5)

## 2017-05-18 LAB — HEPATIC FUNCTION PANEL
ALT: 23 U/L (ref 0–53)
AST: 21 U/L (ref 0–37)
Albumin: 4.1 g/dL (ref 3.5–5.2)
Alkaline Phosphatase: 63 U/L (ref 39–117)
BILIRUBIN DIRECT: 0.1 mg/dL (ref 0.0–0.3)
BILIRUBIN TOTAL: 0.5 mg/dL (ref 0.2–1.2)
Total Protein: 6.4 g/dL (ref 6.0–8.3)

## 2017-05-18 LAB — TSH: TSH: 2.18 u[IU]/mL (ref 0.35–4.50)

## 2017-05-18 LAB — PSA: PSA: 94.69 ng/mL — ABNORMAL HIGH (ref 0.10–4.00)

## 2017-05-18 LAB — HEMOGLOBIN A1C: HEMOGLOBIN A1C: 6.6 % — AB (ref 4.6–6.5)

## 2017-05-18 MED ORDER — LEVOTHYROXINE SODIUM 150 MCG PO TABS: ORAL_TABLET | ORAL | 3 refills | Status: DC

## 2017-05-18 MED ORDER — CLONIDINE HCL 0.2 MG PO TABS: ORAL_TABLET | ORAL | 3 refills | Status: DC

## 2017-05-18 MED ORDER — NIFEDIPINE ER OSMOTIC RELEASE 60 MG PO TB24: ORAL_TABLET | ORAL | 3 refills | Status: DC

## 2017-05-18 MED ORDER — METFORMIN HCL 1000 MG PO TABS: 1000.0000 mg | ORAL_TABLET | Freq: Two times a day (BID) | ORAL | 3 refills | Status: DC

## 2017-05-18 MED ORDER — ATORVASTATIN CALCIUM 40 MG PO TABS: ORAL_TABLET | ORAL | 3 refills | Status: DC

## 2017-05-18 MED ORDER — POTASSIUM CHLORIDE CRYS ER 20 MEQ PO TBCR: EXTENDED_RELEASE_TABLET | ORAL | 3 refills | Status: DC

## 2017-05-18 MED ORDER — GLIPIZIDE ER 10 MG PO TB24: ORAL_TABLET | ORAL | 3 refills | Status: DC

## 2017-05-18 MED ORDER — DOXAZOSIN MESYLATE 8 MG PO TABS: ORAL_TABLET | ORAL | 3 refills | Status: DC

## 2017-05-18 MED ORDER — FOSINOPRIL SODIUM 40 MG PO TABS: 80.0000 mg | ORAL_TABLET | Freq: Every day | ORAL | 3 refills | Status: DC

## 2017-05-18 MED ORDER — SILDENAFIL CITRATE 50 MG PO TABS: 50.0000 mg | ORAL_TABLET | Freq: Every day | ORAL | 3 refills | Status: DC | PRN

## 2017-05-18 MED ORDER — LABETALOL HCL 200 MG PO TABS: ORAL_TABLET | ORAL | 3 refills | Status: DC

## 2017-05-18 MED ORDER — FUROSEMIDE 40 MG PO TABS: 80.0000 mg | ORAL_TABLET | Freq: Every morning | ORAL | 3 refills | Status: DC

## 2017-05-18 NOTE — Progress Notes (Signed)
Subjective:    Patient ID: Matthew Serrano, male    DOB: [birth year], 71 y.o.   MRN: [id number]  HPI  Patient presents for yearly preventative medicine examination. He is a pleasant 71 year old male who  has a past medical history of Aortic stenosis; DIABETES MELLITUS, TYPE II (07/06/2007); ED (erectile dysfunction); Elevated PSA (01/20/2012); History of nuclear stress test; HYPERLIPIDEMIA (07/06/2007); HYPERTENSION (07/06/2007); HYPOTHYROIDISM (07/06/2007); Leg pain; and Obesity.  DM - He takes Metformin 1000 mg in the am and 553m in the pm as well as Glipizide ER 10 mg. His last A1c was 6.3 in JUne 2018 - stable   Hypertension - he takes Clonidine 0.2, Monopril 80 mg, procardia 450m labatelol 200 mg TID,  and lasix 40 mg. - Stable   Hypothyroidism - Controlled with Synthroid [id number]   All immunizations and health maintenance protocols were reviewed with the patient and needed orders were placed. He is up to date on vaccinations   Appropriate screening laboratory values were ordered for the patient including screening of hyperlipidemia, renal function and hepatic function. If indicated by BPH, a PSA was ordered.  Medication reconciliation,  past medical history, social history, problem list and allergies were reviewed in detail with the patient  Goals were established with regard to weight loss, exercise, and  diet in compliance with medications  End of life planning was discussed.  He is due for a colonoscopy.  He participates in routine dental and vision care.   Since he was last seen in the office, he has been evaluated by cardiology for shortness of breath, heart murmur that was heard during his last exam and possible claudication in right leg.  - ABIs- were normal  - Echocardiogram - Mild LVH, EF 60-65, normal wall motion, mild aortic stenosis (mean 10, peak 19), MAC, trivial MR, mild LAE - Nuclear stress test -  inferobasal thinning but no significant ischemia; EF 64  with normal wall motion.  He reports that he has not followed up with Urology yet, but knows that he needs to be seen due to elevated PSA.   He has no acute complaints today   Review of Systems  Constitutional: Negative.   HENT: Negative.   Eyes: Negative.   Respiratory: Negative.   Cardiovascular: Negative.   Gastrointestinal: Negative.   Endocrine: Negative.   Genitourinary: Negative.   Musculoskeletal: Negative.   Skin: Negative.   Allergic/Immunologic: Negative.   Neurological: Negative.   Hematological: Negative.   Psychiatric/Behavioral: Negative.   All other systems reviewed and are negative.  Past Medical History:  Diagnosis Date  . Aortic stenosis    Echo 8/18: mild LVH, EF 60-65, no RWMA, mild AS (mean 10, peak 19), MAC, trivial MR, normal RVSF   . DIABETES MELLITUS, TYPE II 07/06/2007  . ED (erectile dysfunction)   . Elevated PSA 01/20/2012  . History of nuclear stress test    Myoview 8/18: EF 64, inferobasal thinning, no ischemia, low risk  . HYPERLIPIDEMIA 07/06/2007  . HYPERTENSION 07/06/2007  . HYPOTHYROIDISM 07/06/2007  . Leg pain    ABIs 8/18:  Normal   . Obesity     Social History   Social History  . Marital status: Widowed    Spouse name: N/A  . Number of children: N/A  . Years of education: N/A   Occupational History  . Not on file.   Social History Main Topics  . Smoking status: Former [employer] (life sciences) Smokeless tobacco: Never Used  Comment: quit 2005  . Alcohol use 12.6 oz/week    21 Cans of beer per week  . Drug use: No  . Sexual activity: Not on file   Other Topics Concern  . Not on file   Social History Narrative   He works as a Freight forwarder    Not married    No kids              Past Surgical History:  Procedure Laterality Date  . COLONOSCOPY W/ BIOPSIES AND POLYPECTOMY      Family History  Problem Relation Age of Onset  . Hypertension Other   . Breast cancer Sister   . Breast cancer Unknown        family  history  . Breast cancer Brother        prostate ca  . Aneurysm Mother        Brain aneurysm     No Known Allergies  Current Outpatient Prescriptions on File Prior to Visit  Medication Sig Dispense Refill  . aspirin EC 81 MG tablet Take 81 mg by mouth daily.    Marland Kitchen atorvastatin (LIPITOR) 40 MG tablet TAKE 1 TABLET (40 MG TOTAL) BY MOUTH DAILY. 90 tablet 3  . cloNIDine (CATAPRES) 0.2 MG tablet TAKE 2 TABLETS BY MOUTH EVERY MORNING AND 2 TABLETS EVERY EVENING 360 tablet 3  . doxazosin (CARDURA) 8 MG tablet TAKE 1 TABLET (8 MG TOTAL) BY MOUTH AT BEDTIME. 90 tablet 3  . fosinopril (MONOPRIL) 40 MG tablet Take 2 tablets (80 mg total) by mouth daily. 200 tablet 3  . furosemide (LASIX) 40 MG tablet TAKE 2 TABLETS (80 MG TOTAL) BY MOUTH EVERY MORNING. 200 tablet 1  . glipiZIDE (GLUCOTROL XL) 10 MG 24 hr tablet TAKE 1 TABLET (10 MG TOTAL) BY MOUTH 2 (TWO) TIMES DAILY. 180 tablet 3  . labetalol (NORMODYNE) 200 MG tablet TAKE 3 TABLETS BY MOUTH EVERY MORNING AND 2 EVERY EVENING 450 tablet 0  . levothyroxine (SYNTHROID, LEVOTHROID) 150 MCG tablet TAKE 1 TABLET (150 MCG TOTAL) BY MOUTH DAILY. 100 tablet 3  . metFORMIN (GLUCOPHAGE) 1000 MG tablet TAKE 1 TABLET BEFORE BREAKFAST AND A HALF A TABLET BEFORE EVENING MEAL 180 tablet 4  . NIFEdipine (PROCARDIA XL/ADALAT-CC) 60 MG 24 hr tablet TAKE 1 TABLET (60 MG TOTAL) BY MOUTH DAILY. 90 tablet 3  . potassium chloride SA (KLOR-CON M20) 20 MEQ tablet TAKE 1 TABLET BY MOUTH TWICE A DAY 200 tablet 3  . sildenafil (VIAGRA) 50 MG tablet Take 50 mg by mouth daily as needed for erectile dysfunction.     No current facility-administered medications on file prior to visit.     BP 140/80 (BP Location: Left Arm)   Temp 98.6 F (37 C) (Oral)   Ht 5' 7.5" (1.715 m)   Wt 222 lb (100.7 kg)   BMI 34.26 kg/m       Objective:   Physical Exam  Constitutional: He is oriented to person, place, and time. He appears well-developed and well-nourished. No distress.  HENT:   Head: Normocephalic and atraumatic.  Right Ear: External ear normal.  Left Ear: External ear normal.  Nose: Nose normal.  Mouth/Throat: Oropharynx is clear and moist. No oropharyngeal exudate.  Eyes: Pupils are equal, round, and reactive to light. Conjunctivae and EOM are normal. Right eye exhibits no discharge. Left eye exhibits no discharge. No scleral icterus.  Neck: Normal range of motion. Neck supple. No JVD present. No tracheal deviation present. No thyromegaly present.  Cardiovascular: Normal rate, regular rhythm and intact distal pulses.  Exam reveals no gallop and no friction rub.   Murmur heard.  Systolic murmur is present  Pulmonary/Chest: Effort normal and breath sounds normal. No stridor. No respiratory distress. He has no wheezes. He has no rales. He exhibits no tenderness.  Abdominal: Soft. Bowel sounds are normal. He exhibits no distension and no mass. There is no tenderness. There is no rebound and no guarding.  Musculoskeletal: Normal range of motion. He exhibits no edema, tenderness or deformity.  Lymphadenopathy:    He has no cervical adenopathy.  Neurological: He is alert and oriented to person, place, and time. He has normal reflexes. No cranial nerve deficit. Coordination normal.  Skin: Skin is warm and dry. No rash noted. He is not diaphoretic. No erythema. No pallor.  Psychiatric: He has a normal mood and affect. His behavior is normal. Judgment and thought content normal.  Nursing note and vitals reviewed.     Assessment & Plan:  1. Routine general medical examination at a health care facility - Needs to lose weight  - Follow up in one year or sooner if needed - Colonoscopy scheduled  - Basic metabolic panel - CBC with Differential/Platelet - Hemoglobin A1c - Hepatic function panel - Lipid panel - PSA - TSH  2. Elevated PSA - Needs to follow up with urology. I stressed the importance of this today  - Basic metabolic panel - CBC with  Differential/Platelet - Hemoglobin A1c - Hepatic function panel - Lipid panel - PSA - TSH  3. Hyperlipidemia, unspecified hyperlipidemia type  - Basic metabolic panel - CBC with Differential/Platelet - Hemoglobin A1c - Hepatic function panel - Lipid panel - PSA - TSH  4. Type 2 diabetes mellitus without complication, without long-term current use of insulin (HCC) - Encouraged weight loss through diet and exercise  - Consider medication dose change  - Basic metabolic panel - CBC with Differential/Platelet - Hemoglobin A1c - Hepatic function panel - Lipid panel - PSA - TSH  5. Hypothyroidism, unspecified type - Consider dose change of Synthroid  - Basic metabolic panel - CBC with Differential/Platelet - Hemoglobin A1c - Hepatic function panel - Lipid panel - PSA - TSH  6. Essential hypertension - BP today 140/80 - he did not take his meds today  - Encouraged weight loss through diet and exercise  - Basic metabolic panel - CBC with Differential/Platelet - Hemoglobin A1c - Hepatic function panel - Lipid panel - PSA - TSH  7. Need for hepatitis C screening test  - Hep C Antibody  8. Colon cancer screening  - Ambulatory referral to Gastroenterology  [name], NP

## 2017-05-18 NOTE — Patient Instructions (Signed)
It was great seeing you today   I will follow up with you regarding your blood work   Someone will call you for your colonoscopy   Health Maintenance, Male A healthy lifestyle and preventative care can promote health and wellness.  Maintain regular health, dental, and eye exams.  Eat a healthy diet. Foods like vegetables, fruits, whole grains, low-fat dairy products, and lean protein foods contain the nutrients you need and are low in calories. Decrease your intake of foods high in solid fats, added sugars, and salt. Get information about a proper diet from your health care provider, if necessary.  Regular physical exercise is one of the most important things you can do for your health. Most adults should get at least 150 minutes of moderate-intensity exercise (any activity that increases your heart rate and causes you to sweat) each week. In addition, most adults need muscle-strengthening exercises on 2 or more days a week.   Maintain a healthy weight. The body mass index (BMI) is a screening tool to identify possible weight problems. It provides an estimate of body fat based on height and weight. Your health care provider can find your BMI and can help you achieve or maintain a healthy weight. For males 20 years and older:  A BMI below 18.5 is considered underweight.  A BMI of 18.5 to 24.9 is normal.  A BMI of 25 to 29.9 is considered overweight.  A BMI of 30 and above is considered obese.  Maintain normal blood lipids and cholesterol by exercising and minimizing your intake of saturated fat. Eat a balanced diet with plenty of fruits and vegetables. Blood tests for lipids and cholesterol should begin at age 60 and be repeated every 5 years. If your lipid or cholesterol levels are high, you are over age 39, or you are at high risk for heart disease, you may need your cholesterol levels checked more frequently.Ongoing high lipid and cholesterol levels should be treated with medicines if  diet and exercise are not working.  If you smoke, find out from your health care provider how to quit. If you do not use tobacco, do not start.  Lung cancer screening is recommended for adults aged 80-80 years who are at high risk for developing lung cancer because of a history of smoking. A yearly low-dose CT scan of the lungs is recommended for people who have at least a 30-pack-year history of smoking and are current smokers or have quit within the past 15 years. A pack year of smoking is smoking an average of 1 pack of cigarettes a day for 1 year (for example, a 30-pack-year history of smoking could mean smoking 1 pack a day for 30 years or 2 packs a day for 15 years). Yearly screening should continue until the smoker has stopped smoking for at least 15 years. Yearly screening should be stopped for people who develop a health problem that would prevent them from having lung cancer treatment.  If you choose to drink alcohol, do not have more than 2 drinks per day. One drink is considered to be 12 oz (360 mL) of beer, 5 oz (150 mL) of wine, or 1.5 oz (45 mL) of liquor.  Avoid the use of street drugs. Do not share needles with anyone. Ask for help if you need support or instructions about stopping the use of drugs.  High blood pressure causes heart disease and increases the risk of stroke. High blood pressure is more likely to develop in:  People who have blood pressure in the end of the normal range (100-139/85-89 mm Hg).  People who are overweight or obese.  People who are African American.  If you are 60-4 years of age, have your blood pressure checked every 3-5 years. If you are 65 years of age or older, have your blood pressure checked every year. You should have your blood pressure measured twice--once when you are at a hospital or clinic, and once when you are not at a hospital or clinic. Record the average of the two measurements. To check your blood pressure when you are not at a  hospital or clinic, you can use:  An automated blood pressure machine at a pharmacy.  A home blood pressure monitor.  If you are 12-49 years old, ask your health care provider if you should take aspirin to prevent heart disease.  Diabetes screening involves taking a blood sample to check your fasting blood sugar level. This should be done once every 3 years after age 42 if you are at a normal weight and without risk factors for diabetes. Testing should be considered at a younger age or be carried out more frequently if you are overweight and have at least 1 risk factor for diabetes.  Colorectal cancer can be detected and often prevented. Most routine colorectal cancer screening begins at the age of 56 and continues through age 41. However, your health care provider may recommend screening at an earlier age if you have risk factors for colon cancer. On a yearly basis, your health care provider may provide home test kits to check for hidden blood in the stool. A small camera at the end of a tube may be used to directly examine the colon (sigmoidoscopy or colonoscopy) to detect the earliest forms of colorectal cancer. Talk to your health care provider about this at age 84 when routine screening begins. A direct exam of the colon should be repeated every 5-10 years through age 60, unless early forms of precancerous polyps or small growths are found.  People who are at an increased risk for hepatitis B should be screened for this virus. You are considered at high risk for hepatitis B if:  You were born in a country where hepatitis B occurs often. Talk with your health care provider about which countries are considered high risk.  Your parents were born in a high-risk country and you have not received a shot to protect against hepatitis B (hepatitis B vaccine).  You have HIV or AIDS.  You use needles to inject street drugs.  You live with, or have sex with, someone who has hepatitis B.  You are a  man who has sex with other men (MSM).  You get hemodialysis treatment.  You take certain medicines for conditions like cancer, organ transplantation, and autoimmune conditions.  Hepatitis C blood testing is recommended for all people born from [birth year] through 1964 and any individual with known risk factors for hepatitis C.  Healthy men should no longer receive prostate-specific antigen (PSA) blood tests as part of routine cancer screening. Talk to your health care provider about prostate cancer screening.  Testicular cancer screening is not recommended for adolescents or adult males who have no symptoms. Screening includes self-exam, a health care provider exam, and other screening tests. Consult with your health care provider about any symptoms you have or any concerns you have about testicular cancer.  Practice safe sex. Use condoms and avoid high-risk sexual practices to reduce the spread of  sexually transmitted infections (STIs).  You should be screened for STIs, including gonorrhea and chlamydia if:  You are sexually active and are younger than 24 years.  You are older than 24 years, and your health care provider tells you that you are at risk for this type of infection.  Your sexual activity has changed since you were last screened, and you are at an increased risk for chlamydia or gonorrhea. Ask your health care provider if you are at risk.  If you are at risk of being infected with HIV, it is recommended that you take a prescription medicine daily to prevent HIV infection. This is called pre-exposure prophylaxis (PrEP). You are considered at risk if:  You are a man who has sex with other men (MSM).  You are a heterosexual man who is sexually active with multiple partners.  You take drugs by injection.  You are sexually active with a partner who has HIV.  Talk with your health care provider about whether you are at high risk of being infected with HIV. If you choose to begin PrEP,  you should first be tested for HIV. You should then be tested every 3 months for as long as you are taking PrEP.  Use sunscreen. Apply sunscreen liberally and repeatedly throughout the day. You should seek shade when your shadow is shorter than you. Protect yourself by wearing long sleeves, pants, a wide-brimmed hat, and sunglasses year round whenever you are outdoors.  Tell your health care provider of new moles or changes in moles, especially if there is a change in shape or color. Also, tell your health care provider if a mole is larger than the size of a pencil eraser.  A one-time screening for abdominal aortic aneurysm (AAA) and surgical repair of large AAAs by ultrasound is recommended for men aged 14-75 years who are current or former smokers.  Stay current with your vaccines (immunizations).   This information is not intended to replace advice given to you by your health care provider. Make sure you discuss any questions you have with your health care provider.   Document Released: 02/05/2008 Document Revised: 08/30/2014 Document Reviewed: 01/04/2011 Elsevier Interactive Patient Education [employer].

## 2017-05-18 NOTE — Addendum Note (Signed)
Addended by: Apolinar Junes on: 05/18/2017 02:13 PM   Modules accepted: Orders

## 2017-05-19 LAB — HEPATITIS C ANTIBODY
Hepatitis C Ab: NONREACTIVE
SIGNAL TO CUT-OFF: 0.01 (ref <1.00)

## 2017-05-20 ENCOUNTER — Other Ambulatory Visit: Payer: Self-pay | Admitting: Family Medicine

## 2017-05-20 ENCOUNTER — Telehealth: Payer: Self-pay | Admitting: Adult Health

## 2017-05-20 DIAGNOSIS — R972 Elevated prostate specific antigen [PSA]: Secondary | ICD-10-CM

## 2017-05-20 NOTE — Telephone Encounter (Signed)
Pt is returning misty call °

## 2017-05-20 NOTE — Telephone Encounter (Signed)
Notes recorded by Miles Costain T, CMA on 05/20/2017 at 10:31 AM EDT Pt notified of results. Instructed to increase metformin to 1 whole tab am and pm. Agreed to see urology. Order placed in [place].

## 2017-05-30 ENCOUNTER — Other Ambulatory Visit: Payer: Self-pay | Admitting: Family Medicine

## 2017-07-18 ENCOUNTER — Ambulatory Visit (AMBULATORY_SURGERY_CENTER): Payer: Self-pay

## 2017-07-18 VITALS — Ht 68.0 in | Wt 225.0 lb

## 2017-07-18 DIAGNOSIS — Z860100 Personal history of colon polyps, unspecified: Secondary | ICD-10-CM

## 2017-07-18 MED ORDER — SUPREP BOWEL PREP KIT 17.5-3.13-1.6 GM/177ML PO SOLN: 1.0000 | Freq: Once | ORAL | 0 refills | Status: AC

## 2017-07-18 NOTE — Progress Notes (Signed)
No allergies to eggs or soy No diet meds No home oxygen No past problems with anesthesia  Declined emmi 

## 2017-07-21 ENCOUNTER — Encounter: Payer: Self-pay | Admitting: Gastroenterology

## 2017-08-01 ENCOUNTER — Encounter: Payer: 59 | Admitting: Gastroenterology

## 2017-09-22 ENCOUNTER — Encounter: Payer: Self-pay | Admitting: Gastroenterology

## 2017-09-22 ENCOUNTER — Other Ambulatory Visit: Payer: Self-pay

## 2017-09-22 ENCOUNTER — Ambulatory Visit (AMBULATORY_SURGERY_CENTER): Payer: 59 | Admitting: Gastroenterology

## 2017-09-22 VITALS — BP 161/84 | HR 56 | Temp 98.7°F | Resp 10 | Ht 68.0 in | Wt 225.0 lb

## 2017-09-22 DIAGNOSIS — Z860101 Personal history of adenomatous and serrated colon polyps: Secondary | ICD-10-CM

## 2017-09-22 DIAGNOSIS — D120 Benign neoplasm of cecum: Secondary | ICD-10-CM

## 2017-09-22 DIAGNOSIS — K5521 Angiodysplasia of colon with hemorrhage: Secondary | ICD-10-CM

## 2017-09-22 DIAGNOSIS — D123 Benign neoplasm of transverse colon: Secondary | ICD-10-CM

## 2017-09-22 DIAGNOSIS — Q2733 Arteriovenous malformation of digestive system vessel: Secondary | ICD-10-CM

## 2017-09-22 DIAGNOSIS — Z86010 Personal history of colonic polyps: Secondary | ICD-10-CM

## 2017-09-22 MED ORDER — SODIUM CHLORIDE 0.9 % IV SOLN: 500.0000 mL | Freq: Once | INTRAVENOUS | Status: DC

## 2017-09-22 NOTE — Progress Notes (Signed)
Called to room to assist during endoscopic procedure.  Patient ID and intended procedure confirmed with present staff. Received instructions for my participation in the procedure from the performing physician.  

## 2017-09-22 NOTE — Op Note (Addendum)
[facility] Patient Name: Matthew Serrano Procedure Date: 09/22/2017 9:44 AM MRN: [id number] Endoscopist: Ladene Artist , MD Age: 72 Referring MD:  Date of Birth: December 02, 1945 Gender: Male Account #: [id number] Procedure:                Colonoscopy Indications:              Surveillance: Personal history of adenomatous                            polyps on last colonoscopy > 5 years ago Medicines:                Monitored Anesthesia Care Procedure:                Pre-Anesthesia Assessment:                           - Prior to the procedure, a History and Physical                            was performed, and patient medications and                            allergies were reviewed. The patient's tolerance of                            previous anesthesia was also reviewed. The risks                            and benefits of the procedure and the sedation                            options and risks were discussed with the patient.                            All questions were answered, and informed consent                            was obtained. Prior Anticoagulants: The patient has                            taken no previous anticoagulant or antiplatelet                            agents. ASA Grade Assessment: II - A patient with                            mild systemic disease. After reviewing the risks                            and benefits, the patient was deemed in                            satisfactory condition to undergo the procedure.  After obtaining informed consent, the colonoscope                            was passed under direct vision. Throughout the                            procedure, the patient's blood pressure, pulse, and                            oxygen saturations were monitored continuously. The                            Colonoscope was introduced through the anus and                            advanced to the the cecum,  identified by                            appendiceal orifice and ileocecal valve. The                            ileocecal valve, appendiceal orifice, and rectum                            were photographed. The quality of the bowel                            preparation was good. The colonoscopy was performed                            without difficulty. The patient tolerated the                            procedure well. Scope In: 9:46:41 AM Scope Out: 10:08:57 AM Scope Withdrawal Time: 0 hours 19 minutes 52 seconds  Total Procedure Duration: 0 hours 22 minutes 16 seconds  Findings:                 The perianal and digital rectal examinations were                            normal.                           A 6 mm polyp was found in the cecum. It was                            immediately adjacent to a 6 mm AVM. The polyp was                            sessile. The polyp was removed with a hot biopsy                            forceps. Resection and retrieval were complete.  Multiple medium-sized localized angiodysplastic                            lesions were found in the cecum. The AVM adjacent                            to the polyp started bleeding upon removal of the                            polyp. For hemostasis, one hemostatic clip was                            successfully placed (MR conditional). There was no                            bleeding at the end of the maneuver.                           Two sessile polyps were found in the transverse                            colon. The polyps were 7 to 8 mm in size. These                            polyps were removed with a cold snare. Resection                            and retrieval were complete.                           Three sessile polyps were found in the cecum and                            ileocecal valve. The polyps were 4 to 5 mm in size.                            These polyps were  removed with a cold biopsy                            forceps. Resection and retrieval were complete.                           Multiple medium-mouthed diverticula were found in                            the entire colon. There was no evidence of                            diverticular bleeding.                           Internal hemorrhoids were found during  retroflexion. The hemorrhoids were medium-sized and                            Grade I (internal hemorrhoids that do not prolapse).                           The exam was otherwise without abnormality on                            direct and retroflexion views. Complications:            No immediate complications. Estimated blood loss:                            None. Estimated Blood Loss:     Estimated blood loss: none. Impression:               - One 6 mm polyp in the cecum, removed with a hot                            biopsy forceps. Resected and retrieved.                           - Multiple non-bleeding colonic angiodysplastic                            lesions and one bleeding angiodysplasias. Clip (MR                            conditional) was placed. Hemostasis acheived.                           - Two 7 to 8 mm polyps in the transverse colon,                            removed with a cold snare. Resected and retrieved.                           - Three 4 to 5 mm polyps in the cecum and at the                            ileocecal valve, removed with a cold biopsy                            forceps. Resected and retrieved.                           - Internal hemorrhoids.                           - The examination was otherwise normal on direct                            and retroflexion views.                           -  Moderate diverticulosis in the entire examined                            colon. There was no evidence of diverticular                            bleeding. Recommendation:            - Repeat colonoscopy in 3 - 5 years for                            surveillance pending pathology review.                           - Patient has a contact number available for                            emergencies. The signs and symptoms of potential                            delayed complications were discussed with the                            patient. Return to normal activities tomorrow.                            Written discharge instructions were provided to the                            patient.                           - High fiber diet.                           - Continue present medications.                           - Await pathology results.                           - No aspirin, ibuprofen, naproxen, or other                            non-steroidal anti-inflammatory drugs for 2 weeks                            after polyp removal. Ladene Artist, MD 09/22/2017 10:21:50 AM This report has been signed electronically.

## 2017-09-22 NOTE — Progress Notes (Signed)
Report given to PACU, vss 

## 2017-09-22 NOTE — Patient Instructions (Signed)
**   handouts given for polyps, hemorrhoids, diverticulosis and high fiber diet**  Also, card for clip applied**     YOU HAD AN ENDOSCOPIC PROCEDURE TODAY AT [facility]:   Refer to the procedure report that was given to you for any specific questions about what was found during the examination.  If the procedure report does not answer your questions, please call your gastroenterologist to clarify.  If you requested that your care partner not be given the details of your procedure findings, then the procedure report has been included in a sealed envelope for you to review at your convenience later.  YOU SHOULD EXPECT: Some feelings of bloating in the abdomen. Passage of more gas than usual.  Walking can help get rid of the air that was put into your GI tract during the procedure and reduce the bloating. If you had a lower endoscopy (such as a colonoscopy or flexible sigmoidoscopy) you may notice spotting of blood in your stool or on the toilet paper. If you underwent a bowel prep for your procedure, you may not have a normal bowel movement for a few days.  Please Note:  You might notice some irritation and congestion in your nose or some drainage.  This is from the oxygen used during your procedure.  There is no need for concern and it should clear up in a day or so.  SYMPTOMS TO REPORT IMMEDIATELY:   Following lower endoscopy (colonoscopy or flexible sigmoidoscopy):  Excessive amounts of blood in the stool  Significant tenderness or worsening of abdominal pains  Swelling of the abdomen that is new, acute  Fever of 100F or higher   For urgent or emergent issues, a gastroenterologist can be reached at any hour by calling [phone].   DIET:  We do recommend a small meal at first, but then you may proceed to your regular diet.  Drink plenty of fluids but you should avoid alcoholic beverages for 24 hours.  ACTIVITY:  You should plan to take it easy for the rest of today  and you should NOT DRIVE or use heavy machinery until tomorrow (because of the sedation medicines used during the test).    FOLLOW UP: Our staff will call the number listed on your records the next business day following your procedure to check on you and address any questions or concerns that you may have regarding the information given to you following your procedure. If we do not reach you, we will leave a message.  However, if you are feeling well and you are not experiencing any problems, there is no need to return our call.  We will assume that you have returned to your regular daily activities without incident.  If any biopsies were taken you will be contacted by phone or by letter within the next 1-3 weeks.  Please call us at [phone] if you have not heard about the biopsies in 3 weeks.    SIGNATURES/CONFIDENTIALITY: You and/or your care partner have signed paperwork which will be entered into your electronic medical record.  These signatures attest to the fact that that the information above on your After Visit Summary has been reviewed and is understood.  Full responsibility of the confidentiality of this discharge information lies with you and/or your care-partner.

## 2017-09-23 ENCOUNTER — Telehealth: Payer: Self-pay

## 2017-09-23 NOTE — Telephone Encounter (Signed)
  Follow up Call-  Call back number 09/22/2017  Post procedure Call Back phone  # [phone]  Permission to leave phone message Yes  Some recent data might be hidden     Patient questions:  Do you have a fever, pain , or abdominal swelling? No. Pain Score  0 *  Have you tolerated food without any problems? Yes.    Have you been able to return to your normal activities? Yes.    Do you have any questions about your discharge instructions: Diet   No. Medications  No. Follow up visit  No.  Do you have questions or concerns about your Care? No.  Actions: * If pain score is 4 or above: No action needed, pain <4.

## 2017-09-23 NOTE — Telephone Encounter (Signed)
Number identifier, left a voicemail. 

## 2017-10-06 ENCOUNTER — Encounter: Payer: Self-pay | Admitting: Gastroenterology

## 2017-10-27 ENCOUNTER — Other Ambulatory Visit: Payer: Self-pay | Admitting: Adult Health

## 2017-10-27 NOTE — Telephone Encounter (Signed)
This refill request is not needed.  Per Caryl Pina, at pharmacy, pt has refills on file.

## 2018-02-07 ENCOUNTER — Telehealth: Payer: Self-pay | Admitting: Adult Health

## 2018-02-07 NOTE — Telephone Encounter (Signed)
Pt states that he does not need any refills at this time, just an FYI to ensure that his pharmacy is correct.  Nothing further needed.

## 2018-02-07 NOTE — Telephone Encounter (Signed)
Copied from [place] [id number]. Topic: Quick Communication - See Telephone Encounter >> Feb 07, 2018 11:11 AM Rutherford Nail, NT wrote: CRM for notification. See Telephone encounter for: 02/07/18. Patient calling and states that he is switching his pharmacy to [facility], [address] [facility] [address] for all current medications. Please advise.  CB#: [phone]

## 2018-02-21 ENCOUNTER — Other Ambulatory Visit: Payer: Self-pay | Admitting: Family Medicine

## 2018-02-21 DIAGNOSIS — E782 Mixed hyperlipidemia: Secondary | ICD-10-CM

## 2018-02-21 DIAGNOSIS — R3914 Feeling of incomplete bladder emptying: Principal | ICD-10-CM

## 2018-02-21 DIAGNOSIS — N401 Enlarged prostate with lower urinary tract symptoms: Secondary | ICD-10-CM

## 2018-02-21 NOTE — Telephone Encounter (Signed)
Pt now due for cpx.

## 2018-03-03 NOTE — Telephone Encounter (Signed)
All medications filled on 05/18/17 for 1 year.  Refill request is too early.

## 2018-04-11 ENCOUNTER — Ambulatory Visit (HOSPITAL_COMMUNITY): Payer: Medicare Other | Attending: Cardiovascular Disease

## 2018-04-11 ENCOUNTER — Encounter (INDEPENDENT_AMBULATORY_CARE_PROVIDER_SITE_OTHER): Payer: Self-pay

## 2018-04-11 ENCOUNTER — Other Ambulatory Visit: Payer: Self-pay

## 2018-04-11 DIAGNOSIS — R001 Bradycardia, unspecified: Secondary | ICD-10-CM | POA: Diagnosis not present

## 2018-04-11 DIAGNOSIS — E785 Hyperlipidemia, unspecified: Secondary | ICD-10-CM | POA: Insufficient documentation

## 2018-04-11 DIAGNOSIS — R0600 Dyspnea, unspecified: Secondary | ICD-10-CM | POA: Diagnosis not present

## 2018-04-11 DIAGNOSIS — I350 Nonrheumatic aortic (valve) stenosis: Secondary | ICD-10-CM | POA: Insufficient documentation

## 2018-04-11 DIAGNOSIS — E119 Type 2 diabetes mellitus without complications: Secondary | ICD-10-CM | POA: Diagnosis not present

## 2018-04-11 DIAGNOSIS — I119 Hypertensive heart disease without heart failure: Secondary | ICD-10-CM | POA: Insufficient documentation

## 2018-04-12 ENCOUNTER — Telehealth: Payer: Self-pay | Admitting: *Deleted

## 2018-04-12 NOTE — Telephone Encounter (Signed)
Left message to go over echo results. Pt also needs to be scheduled for a f/u appt which looks like this was not done at ov at the time.

## 2018-04-12 NOTE — Telephone Encounter (Signed)
-----   Message from Burtis Junes, NP sent at 04/11/2018  8:56 PM EDT ----- Reviewed for Richardson Dopp, PA Ok to report.  The echo shows that the aortic stenosis has worsened since last year. Still has normal pumping function.  He was to have follow up with Nicki Reaper per his last note - please arrange OV.

## 2018-04-13 NOTE — Telephone Encounter (Signed)
-----   Message from Burtis Junes, NP sent at 04/11/2018  8:56 PM EDT ----- Reviewed for Richardson Dopp, PA Ok to report.  The echo shows that the aortic stenosis has worsened since last year. Still has normal pumping function.  He was to have follow up with Nicki Reaper per his last note - please arrange OV.

## 2018-04-13 NOTE — Telephone Encounter (Signed)
Pt has been notified of echo results by phone with verbal understanding. Pt aware AS has worsened since last year and needs an appt to see Nicki Reaper to discuss results further. Pt has been scheduled to see PA 04/21/18 @ 9:15. Pt thanked me for the call.

## 2018-04-21 ENCOUNTER — Ambulatory Visit: Payer: Medicare Other | Admitting: Physician Assistant

## 2018-05-13 ENCOUNTER — Other Ambulatory Visit: Payer: Self-pay | Admitting: Adult Health

## 2018-05-13 DIAGNOSIS — E119 Type 2 diabetes mellitus without complications: Secondary | ICD-10-CM

## 2018-05-16 ENCOUNTER — Ambulatory Visit (HOSPITAL_COMMUNITY)
Admission: RE | Admit: 2018-05-16 | Discharge: 2018-05-16 | Disposition: A | Discharge status: Home / Self Care | Payer: Medicare Other | Source: Ambulatory Visit | Attending: Internal Medicine | Admitting: Internal Medicine

## 2018-05-16 ENCOUNTER — Encounter (HOSPITAL_COMMUNITY): Payer: Medicare Other

## 2018-05-16 ENCOUNTER — Encounter: Payer: Self-pay | Admitting: Physician Assistant

## 2018-05-16 ENCOUNTER — Ambulatory Visit (INDEPENDENT_AMBULATORY_CARE_PROVIDER_SITE_OTHER): Payer: Medicare Other | Admitting: Physician Assistant

## 2018-05-16 VITALS — BP 128/62 | HR 52 | Ht 68.0 in | Wt 227.1 lb

## 2018-05-16 DIAGNOSIS — R972 Elevated prostate specific antigen [PSA]: Secondary | ICD-10-CM

## 2018-05-16 DIAGNOSIS — I10 Essential (primary) hypertension: Secondary | ICD-10-CM | POA: Diagnosis not present

## 2018-05-16 DIAGNOSIS — I350 Nonrheumatic aortic (valve) stenosis: Secondary | ICD-10-CM | POA: Diagnosis not present

## 2018-05-16 DIAGNOSIS — I5189 Other ill-defined heart diseases: Secondary | ICD-10-CM | POA: Diagnosis not present

## 2018-05-16 DIAGNOSIS — M7989 Other specified soft tissue disorders: Secondary | ICD-10-CM | POA: Diagnosis not present

## 2018-05-16 LAB — BASIC METABOLIC PANEL
BUN/Creatinine Ratio: 8 — ABNORMAL LOW (ref 10–24)
BUN: 7 mg/dL — ABNORMAL LOW (ref 8–27)
CALCIUM: 8.8 mg/dL (ref 8.6–10.2)
CHLORIDE: 101 mmol/L (ref 96–106)
CO2: 26 mmol/L (ref 20–29)
Creatinine, Ser: 0.86 mg/dL (ref 0.76–1.27)
GFR, EST AFRICAN AMERICAN: 102 mL/min/{1.73_m2} (ref >59)
GFR, EST NON AFRICAN AMERICAN: 88 mL/min/{1.73_m2} (ref >59)
Glucose: 128 mg/dL — ABNORMAL HIGH (ref 65–99)
POTASSIUM: 4.1 mmol/L (ref 3.5–5.2)
Sodium: 143 mmol/L (ref 134–144)

## 2018-05-16 LAB — CBC
HEMOGLOBIN: 11.8 g/dL — AB (ref 13.0–17.7)
Hematocrit: 36.7 % — ABNORMAL LOW (ref 37.5–51.0)
MCH: 27.4 pg (ref 26.6–33.0)
MCHC: 32.2 g/dL (ref 31.5–35.7)
MCV: 85 fL (ref 79–97)
Platelets: 204 10*3/uL (ref 150–450)
RBC: 4.3 x10E6/uL (ref 4.14–5.80)
RDW: 14.8 % (ref 12.3–15.4)
WBC: 4.7 10*3/uL (ref 3.4–10.8)

## 2018-05-16 LAB — PRO B NATRIURETIC PEPTIDE: NT-PRO BNP: 98 pg/mL (ref 0–376)

## 2018-05-16 NOTE — Progress Notes (Signed)
Cardiology Office Note:    Date:  05/16/2018   ID:  Matthew Serrano., DOB [birth year], MRN [id number]  PCP:  [name], NP  Cardiologist:  [name], MD   Electrophysiologist:  None   Referring MD: [name], NP   Chief Complaint  Patient presents with  . Follow-up    aortic stenosis     History of Present Illness:    Matthew Serrano. is a 72 y.o. male with diabetes, hypertension, hyperlipidemia, hypothyroidism, erectile dysfunction, mild aortic stenosis.  He was last seen in 04/2017.      Matthew Serrano returns for follow-up.  He is here alone.  Over the past couple of weeks, he has noticed that his left leg is swollen.  It may go down a little bit when he lies supine.  He denies shortness of breath, chest pain, syncope.  He denies orthopnea, PND.  He denies cough or hemoptysis.  He did travel about 300 miles by car in August.  The swelling started sometime after that.  Of note, he had a significantly elevated PSA last year.  He has not seen neurology.  Prior CV studies:   The following studies were reviewed today:   Echo 04/11/18 Mild conc LVH, EF 55-60, Gr 1 DD, no RWMA, mod AS (mean 17, peak 43), mild LAE  ABIs 8/18:  Normal   Echocardiogram 03/25/17 Mild LVH, EF 60-65, normal wall motion, mild aortic stenosis (mean 10, peak 19), MAC, trivial MR, mild LAE  Nuclear stress test 03/25/17 Probably normal, low risk stress nuclear study with inferobasal thinning but no significant ischemia; EF 64 with normal wall motion.  Abdominal MRA 8/13 1. Negative for renal artery stenosis or other lesion to suggest renovascular component of hypertension.  Echo 8/13 Mild LVH, EF 55-60, no RWMA, Gr 1 DD, mild LAE  Renal Art Duplex 6/13 No RAS; normal caliber abd aorta  Past Medical History:  Diagnosis Date  . Aortic stenosis    Echo 8/18: mild LVH, EF 60-65, no RWMA, mild AS (mean 10, peak 19), MAC, trivial MR, normal RVSF   . DIABETES MELLITUS, TYPE II 07/06/2007    . ED (erectile dysfunction)   . Elevated PSA 01/20/2012  . Elevated PSA   . Heart murmur   . History of nuclear stress test    Myoview 8/18: EF 64, inferobasal thinning, no ischemia, low risk  . HYPERLIPIDEMIA 07/06/2007  . HYPERTENSION 07/06/2007  . HYPOTHYROIDISM 07/06/2007  . Leg pain    ABIs 8/18:  Normal   . Obesity    Surgical Hx: The patient  has a past surgical history that includes Colonoscopy w/ biopsies and polypectomy; Colonoscopy; and Polypectomy.   Current Medications: Current Meds  Medication Sig  . aspirin EC 81 MG tablet Take 81 mg by mouth daily.  Marland Kitchen atorvastatin (LIPITOR) 40 MG tablet TAKE 1 TABLET (40 MG TOTAL) BY MOUTH DAILY.  . cloNIDine (CATAPRES) 0.2 MG tablet TAKE 2 TABLETS BY MOUTH EVERY MORNING AND 2 TABLETS EVERY EVENING  . doxazosin (CARDURA) 8 MG tablet TAKE 1 TABLET (8 MG TOTAL) BY MOUTH AT BEDTIME.  . fosinopril (MONOPRIL) 40 MG tablet Take 2 tablets (80 mg total) by mouth daily.  . furosemide (LASIX) 40 MG tablet Take 2 tablets (80 mg total) by mouth every morning. (Patient taking differently: Take 80 mg by mouth daily. )  . glipiZIDE (GLUCOTROL XL) 10 MG 24 hr tablet TAKE 1 TABLET BY MOUTH TWICE A DAY  . labetalol (NORMODYNE)  200 MG tablet TAKE 3 TABLETS BY MOUTH EVERY MORNING AND 2 EVERY EVENING  . levothyroxine (SYNTHROID, LEVOTHROID) 150 MCG tablet TAKE 1 TABLET (150 MCG TOTAL) BY MOUTH DAILY.  . metFORMIN (GLUCOPHAGE) 1000 MG tablet Take 1 tablet (1,000 mg total) by mouth 2 (two) times daily with a meal.  . NIFEdipine (PROCARDIA XL/ADALAT-CC) 60 MG 24 hr tablet TAKE 1 TABLET (60 MG TOTAL) BY MOUTH DAILY.  Marland Kitchen potassium chloride SA (KLOR-CON M20) 20 MEQ tablet TAKE 1 TABLET BY MOUTH TWICE A DAY  . sildenafil (VIAGRA) 50 MG tablet Take 1 tablet (50 mg total) by mouth daily as needed for erectile dysfunction.     Allergies:   Patient has no known allergies.   Social History   Tobacco Use  . Smoking status: Former Research scientist (life sciences)  . Smokeless tobacco:  Never Used  . Tobacco comment: quit 2005  Substance Use Topics  . Alcohol use: Yes    Alcohol/week: 21.0 standard drinks    Types: 21 Cans of beer per week  . Drug use: No     Family Hx: The patient's family history includes Aneurysm in his mother; Breast cancer in his brother, sister, and unknown relative; Hypertension in his other; Prostate cancer in his father. There is no history of Colon cancer.  ROS:   Please see the history of present illness.    Review of Systems  Constitution: Positive for diaphoresis.  Cardiovascular: Positive for leg swelling.  Musculoskeletal: Positive for joint pain.  Gastrointestinal: Positive for diarrhea.   All other systems reviewed and are negative.   EKGs/Labs/Other Test Reviewed:    EKG:  EKG is  ordered today.  The ekg ordered today demonstrates sinus bradycardia, heart rate 52, leftward axis, QTC 427, similar to old EKG  Recent Labs: 05/18/2017: ALT 23; BUN 11; Creatinine, Ser 0.77; Hemoglobin 12.6; Platelets 221.0; Potassium 3.9; Sodium 142; TSH 2.18   Recent Lipid Panel Lab Results  Component Value Date/Time   CHOL 157 05/18/2017 09:07 AM   TRIG 98.0 05/18/2017 09:07 AM   HDL 62.30 05/18/2017 09:07 AM   CHOLHDL 3 05/18/2017 09:07 AM   LDLCALC 75 05/18/2017 09:07 AM   LDLDIRECT 105.0 04/28/2016 07:56 AM    Physical Exam:    VS:  BP 128/62   Pulse (!) 52   Ht 5' 8"  (1.727 m)   Wt 227 lb 1.9 oz (103 kg)   SpO2 95%   BMI 34.53 kg/m     Wt Readings from Last 3 Encounters:  05/16/18 227 lb 1.9 oz (103 kg)  09/22/17 225 lb (102.1 kg)  07/18/17 225 lb (102.1 kg)     Physical Exam  Constitutional: He is oriented to person, place, and time. He appears well-developed and well-nourished. No distress.  HENT:  Head: Normocephalic and atraumatic.  Eyes: No scleral icterus.  Neck: No JVD present. No thyromegaly present.  Cardiovascular: Normal rate, regular rhythm, S1 normal and S2 normal.  Murmur heard.  Crescendo-decrescendo  systolic murmur is present with a grade of 3/6 at the upper right sternal border. Pulmonary/Chest: Effort normal. He has no rales.  Abdominal: Soft. He exhibits no distension.  Musculoskeletal: He exhibits edema (2+ L LE edema; no calf tenderness).  Lymphadenopathy:    He has no cervical adenopathy.  Neurological: He is alert and oriented to person, place, and time.  Skin: Skin is warm and dry.  Psychiatric: He has a normal mood and affect.    ASSESSMENT & PLAN:    Aortic valve stenosis,  etiology of cardiac valve disease unspecified Moderate by most recent echocardiogram.  Mean gradient has increased from 10-17 by most recent echocardiogram.  Peak gradient is now 43.  He is not having any symptoms.  Given the change in his aortic stenosis I will have him undergo a repeat limited echo in 6 months to recheck his aortic stenosis and ejection fraction.  Follow-up in cardiology in 6 months.  Left leg swelling He has a history of a significant elevated PSA which raises the question of underlying malignancy.  He had a prolonged trip in August.  His leg swelling occurred after this.  I am concerned that he may have an acute DVT.  -Arrange lower extremity venous duplex today  -He will need to be admitted to the hospital if his duplex is positive for DVT  -Obtain BMET, BNP, CBC  -If [place] is negative for DVT, consider increasing furosemide with earlier follow-up  Essential hypertension The patient's blood pressure is controlled on his current regimen.  Continue current therapy.   Elevated PSA Follow-up with primary care.   Dispo:  Return in about 6 months (around 11/14/2018) for Routine Follow Up, w/ Dr. Burt Knack, or Richardson Dopp, PA-C.   Medication Adjustments/Labs and Tests Ordered: Current medicines are reviewed at length with the patient today.  Concerns regarding medicines are outlined above.  Tests Ordered: Orders Placed This Encounter  Procedures  . Basic Metabolic Panel (BMET)  . CBC    . Pro b natriuretic peptide  . EKG 12-Lead  . ECHOCARDIOGRAM LIMITED   Medication Changes: No orders of the defined types were placed in this encounter.   Signed, Richardson Dopp, PA-C  05/16/2018 12:47 PM    [facility] [place], [place], [place]  [address] Phone: [phone]; Fax: [phone]

## 2018-05-16 NOTE — Patient Instructions (Signed)
Medication Instructions:  1. Your physician recommends that you continue on your current medications as directed. Please refer to the Current Medication list given to you today.   Labwork: TODAY BMET, CBC, PRO BNP  Testing/Procedures: 1. Your physician has requested that you have a lower extremity venous duplex. This test is an ultrasound of the veins in the legs or arms. It looks at venous blood flow that carries blood from the heart to the legs or arms. Allow one hour for a Lower Venous exam. Allow thirty minutes for an Upper Venous exam. There are no restrictions or special instructions.  2. Your physician has requested that you have an LIMITED echocardiogram TO BE DONE IN 6 MONTHS  Echocardiography is a painless test that uses sound waves to create images of your heart. It provides your doctor with information about the size and shape of your heart and how well your heart's chambers and valves are working. This procedure takes approximately one hour. There are no restrictions for this procedure.     Follow-Up: DR. Burt Knack 6 MONTHS ABOUT 1 WEEK AFTER LIMITED ECHO HAS BEEN COMPLETED  Any Other Special Instructions Will Be Listed Below (If Applicable).     If you need a refill on your cardiac medications before your next appointment, please call your pharmacy.

## 2018-05-17 ENCOUNTER — Telehealth: Payer: Self-pay | Admitting: *Deleted

## 2018-05-17 NOTE — Telephone Encounter (Signed)
Pt has been notified of lab results. Pt aware to take an extra lasix 40 mg daily for 3 days then resume lasix 80 mg daily. Pt aware to take an extra 20 meq K+ daily for 3 days then resume K+ 20 meq BID. Advised pt to keep leg elevated as much as possible. Advised pt to f/u with PCP about Left leg swelling. Pt notified of ultrasound results negative for DVT. Pt thanked me for the call  Today.

## 2018-05-17 NOTE — Telephone Encounter (Signed)
-----   Message from Liliane Shi, [place] sent at 05/16/2018  5:16 PM EDT ----- Kidney function, potassium normal. The hemoglobin is overall stable. The BNP is normal. Preliminary results of his [place] are negative for DVT. Medication changes / Follow up labs / Other changes or recommendations:    - He can take one extra Lasix 40 mg once daily for 3 days, (total of 120 mg daily), then resume 80 mg daily  - Take an extra K+ 20 mEq for 3 days (total of 60 mEq each day), then resume K+ 20 mEq Twice daily   - Keep leg elevated as much as possible   - Follow up with PCP for L leg swelling. Richardson Dopp, PA-C 05/16/2018 5:12 PM

## 2018-05-24 ENCOUNTER — Ambulatory Visit (INDEPENDENT_AMBULATORY_CARE_PROVIDER_SITE_OTHER): Payer: Medicare Other | Admitting: Adult Health

## 2018-05-24 ENCOUNTER — Encounter: Payer: Self-pay | Admitting: Adult Health

## 2018-05-24 VITALS — BP 120/70 | HR 66 | Temp 98.5°F | Ht 67.0 in | Wt 228.4 lb

## 2018-05-24 DIAGNOSIS — R972 Elevated prostate specific antigen [PSA]: Secondary | ICD-10-CM

## 2018-05-24 DIAGNOSIS — N401 Enlarged prostate with lower urinary tract symptoms: Secondary | ICD-10-CM | POA: Diagnosis not present

## 2018-05-24 DIAGNOSIS — E782 Mixed hyperlipidemia: Secondary | ICD-10-CM

## 2018-05-24 DIAGNOSIS — E119 Type 2 diabetes mellitus without complications: Secondary | ICD-10-CM | POA: Diagnosis not present

## 2018-05-24 DIAGNOSIS — Z23 Encounter for immunization: Secondary | ICD-10-CM | POA: Diagnosis not present

## 2018-05-24 DIAGNOSIS — R3914 Feeling of incomplete bladder emptying: Secondary | ICD-10-CM

## 2018-05-24 DIAGNOSIS — I10 Essential (primary) hypertension: Secondary | ICD-10-CM

## 2018-05-24 LAB — HEPATIC FUNCTION PANEL
ALT: 16 U/L (ref 0–53)
AST: 18 U/L (ref 0–37)
Albumin: 4.3 g/dL (ref 3.5–5.2)
Alkaline Phosphatase: 51 U/L (ref 39–117)
BILIRUBIN DIRECT: 0.1 mg/dL (ref 0.0–0.3)
BILIRUBIN TOTAL: 0.5 mg/dL (ref 0.2–1.2)
Total Protein: 6.6 g/dL (ref 6.0–8.3)

## 2018-05-24 LAB — CBC WITH DIFFERENTIAL/PLATELET
BASOS PCT: 1.3 % (ref 0.0–3.0)
Basophils Absolute: 0.1 10*3/uL (ref 0.0–0.1)
EOS ABS: 0.1 10*3/uL (ref 0.0–0.7)
EOS PCT: 2.9 % (ref 0.0–5.0)
HCT: 36 % — ABNORMAL LOW (ref 39.0–52.0)
HEMOGLOBIN: 11.9 g/dL — AB (ref 13.0–17.0)
LYMPHS ABS: 1.6 10*3/uL (ref 0.7–4.0)
Lymphocytes Relative: 33.7 % (ref 12.0–46.0)
MCHC: 33 g/dL (ref 30.0–36.0)
MCV: 85.3 fl (ref 78.0–100.0)
MONO ABS: 0.5 10*3/uL (ref 0.1–1.0)
Monocytes Relative: 11 % (ref 3.0–12.0)
NEUTROS PCT: 51.1 % (ref 43.0–77.0)
Neutro Abs: 2.4 10*3/uL (ref 1.4–7.7)
PLATELETS: 200 10*3/uL (ref 150.0–400.0)
RBC: 4.22 Mil/uL (ref 4.22–5.81)
RDW: 15 % (ref 11.5–15.5)
WBC: 4.6 10*3/uL (ref 4.0–10.5)

## 2018-05-24 LAB — BASIC METABOLIC PANEL
BUN: 19 mg/dL (ref 6–23)
CO2: 31 mEq/L (ref 19–32)
CREATININE: 0.99 mg/dL (ref 0.40–1.50)
Calcium: 9.1 mg/dL (ref 8.4–10.5)
Chloride: 102 mEq/L (ref 96–112)
GFR: 95.9 mL/min (ref >60.00)
GLUCOSE: 118 mg/dL — AB (ref 70–99)
Potassium: 4.2 mEq/L (ref 3.5–5.1)
Sodium: 143 mEq/L (ref 135–145)

## 2018-05-24 LAB — LIPID PANEL
CHOL/HDL RATIO: 2
Cholesterol: 165 mg/dL (ref 0–200)
HDL: 67.8 mg/dL (ref >39.00)
LDL CALC: 74 mg/dL (ref 0–99)
NonHDL: 97.52
TRIGLYCERIDES: 116 mg/dL (ref 0.0–149.0)
VLDL: 23.2 mg/dL (ref 0.0–40.0)

## 2018-05-24 LAB — HEMOGLOBIN A1C: Hgb A1c MFr Bld: 6.4 % (ref 4.6–6.5)

## 2018-05-24 LAB — TSH: TSH: 4.41 u[IU]/mL (ref 0.35–4.50)

## 2018-05-24 MED ORDER — DOXAZOSIN MESYLATE 8 MG PO TABS: ORAL_TABLET | ORAL | 3 refills | Status: DC

## 2018-05-24 MED ORDER — LABETALOL HCL 200 MG PO TABS: ORAL_TABLET | ORAL | 3 refills | Status: DC

## 2018-05-24 MED ORDER — CLONIDINE HCL 0.2 MG PO TABS: ORAL_TABLET | ORAL | 3 refills | Status: DC

## 2018-05-24 MED ORDER — FUROSEMIDE 40 MG PO TABS: 40.0000 mg | ORAL_TABLET | Freq: Every morning | ORAL | 0 refills | Status: DC

## 2018-05-24 MED ORDER — ATORVASTATIN CALCIUM 40 MG PO TABS: ORAL_TABLET | ORAL | 3 refills | Status: DC

## 2018-05-24 MED ORDER — NIFEDIPINE ER OSMOTIC RELEASE 60 MG PO TB24: ORAL_TABLET | ORAL | 3 refills | Status: DC

## 2018-05-24 MED ORDER — POTASSIUM CHLORIDE CRYS ER 20 MEQ PO TBCR: EXTENDED_RELEASE_TABLET | ORAL | 3 refills | Status: DC

## 2018-05-24 MED ORDER — FOSINOPRIL SODIUM 40 MG PO TABS: 80.0000 mg | ORAL_TABLET | Freq: Every day | ORAL | 3 refills | Status: DC

## 2018-05-24 NOTE — Patient Instructions (Signed)
It was great seeing you today   Someone from urology will call you to schedule your appointment   I will follow up with you regarding your blood work   Please follow up with me in 6 months for diabetic check or sooner if needed

## 2018-05-24 NOTE — Progress Notes (Addendum)
Subjective:    Patient ID: Matthew Serrano., male    DOB: 10-Jan-1946, 72 y.o.   MRN: [id number]  HPI Patient presents for yearly preventative medicine examination. He is a pleasant 72 year old male who  has a past medical history of Aortic stenosis, DIABETES MELLITUS, TYPE II (07/06/2007), ED (erectile dysfunction), Elevated PSA (01/20/2012), Elevated PSA, Heart murmur, History of nuclear stress test, HYPERLIPIDEMIA (07/06/2007), HYPERTENSION (07/06/2007), HYPOTHYROIDISM (07/06/2007), Leg pain, and Obesity.   DM - metformin 1000 mg BID as well as glipizide extended release 10 mg daily. He been checking his blood sugars every other day, he has not had any episodes of hypoglycemia. The highest he reports is 250 Lab Results  Component Value Date   HGBA1C 6.6 (H) 05/18/2017    Essential Hypertension -takes clonidine 0.2 mg, Monopril 80 mg, Procardia 40 mg, clobetasol 200 mg 3 times daily, and Lasix 40 mg BP Readings from Last 3 Encounters:  05/24/18 120/70  05/16/18 128/62  09/22/17 (!) 161/84   Hypothyroidism - Controlled with Synthroid 150 mcg daily.   PSA - Severely elevated. He has been advised multiple times to be seen by his Urology but he has failed to do so He reports that he still has not followed up with urology. Will place STAT referral today.   All immunizations and health maintenance protocols were reviewed with the patient and needed orders were placed. He is due for yearly flu vaccination   Appropriate screening laboratory values were ordered for the patient including screening of hyperlipidemia, renal function and hepatic function. If indicated by BPH, a PSA was ordered.  Medication reconciliation,  past medical history, social history, problem list and allergies were reviewed in detail with the patient  Goals were established with regard to weight loss, exercise, and  diet in compliance with medications  End of life planning was discussed.  He is up to date on  routine colonoscopy. Has not scheduled his eye exam today   Review of Systems  Constitutional: Negative.   HENT: Negative.   Eyes: Negative.   Respiratory: Negative.   Cardiovascular: Negative.   Gastrointestinal: Negative.   Endocrine: Negative.   Genitourinary: Negative.   Musculoskeletal: Negative.   Skin: Negative.   Allergic/Immunologic: Negative.   Neurological: Negative.   Hematological: Negative.   Psychiatric/Behavioral: Negative.   All other systems reviewed and are negative.  Past Medical History:  Diagnosis Date  . Aortic stenosis    Echo 8/18: mild LVH, EF 60-65, no RWMA, mild AS (mean 10, peak 19), MAC, trivial MR, normal RVSF   . DIABETES MELLITUS, TYPE II 07/06/2007  . ED (erectile dysfunction)   . Elevated PSA 01/20/2012  . Elevated PSA   . Heart murmur   . History of nuclear stress test    Myoview 8/18: EF 64, inferobasal thinning, no ischemia, low risk  . HYPERLIPIDEMIA 07/06/2007  . HYPERTENSION 07/06/2007  . HYPOTHYROIDISM 07/06/2007  . Leg pain    ABIs 8/18:  Normal   . Obesity     Social History   Socioeconomic History  . Marital status: Widowed    Spouse name: Not on file  . Number of children: Not on file  . Years of education: Not on file  . Highest education level: Not on file  Occupational History  . Not on file  Social Needs  . Financial resource strain: Not on file  . Food insecurity:    Worry: Not on file    Inability: Not on file  .  Transportation needs:    Medical: Not on file    Non-medical: Not on file  Tobacco Use  . Smoking status: Former Research scientist (life sciences)  . Smokeless tobacco: Never Used  . Tobacco comment: quit 2005  Substance and Sexual Activity  . Alcohol use: Yes    Alcohol/week: 21.0 standard drinks    Types: 21 Cans of beer per week  . Drug use: No  . Sexual activity: Not on file  Lifestyle  . Physical activity:    Days per week: Not on file    Minutes per session: Not on file  . Stress: Not on file  Relationships   . Social connections:    Talks on phone: Not on file    Gets together: Not on file    Attends religious service: Not on file    Active member of club or organization: Not on file    Attends meetings of clubs or organizations: Not on file    Relationship status: Not on file  . Intimate partner violence:    Fear of current or ex partner: Not on file    Emotionally abused: Not on file    Physically abused: Not on file    Forced sexual activity: Not on file  Other Topics Concern  . Not on file  Social History Narrative   He works as a Freight forwarder    Not married    No kids           Past Surgical History:  Procedure Laterality Date  . COLONOSCOPY    . COLONOSCOPY W/ BIOPSIES AND POLYPECTOMY    . POLYPECTOMY      Family History  Problem Relation Age of Onset  . Hypertension Other   . Breast cancer Sister   . Breast cancer Unknown        family history  . Breast cancer Brother        prostate ca  . Aneurysm Mother        Brain aneurysm   . Prostate cancer Father   . Colon cancer Neg Hx     No Known Allergies  Current Outpatient Medications on File Prior to Visit  Medication Sig Dispense Refill  . aspirin EC 81 MG tablet Take 81 mg by mouth daily.    Marland Kitchen atorvastatin (LIPITOR) 40 MG tablet TAKE 1 TABLET (40 MG TOTAL) BY MOUTH DAILY. 90 tablet 3  . cloNIDine (CATAPRES) 0.2 MG tablet TAKE 2 TABLETS BY MOUTH EVERY MORNING AND 2 TABLETS EVERY EVENING 360 tablet 3  . doxazosin (CARDURA) 8 MG tablet TAKE 1 TABLET (8 MG TOTAL) BY MOUTH AT BEDTIME. 90 tablet 3  . fosinopril (MONOPRIL) 40 MG tablet Take 2 tablets (80 mg total) by mouth daily. 200 tablet 3  . glipiZIDE (GLUCOTROL XL) 10 MG 24 hr tablet TAKE 1 TABLET BY MOUTH TWICE A DAY 180 tablet 0  . labetalol (NORMODYNE) 200 MG tablet TAKE 3 TABLETS BY MOUTH EVERY MORNING AND 2 EVERY EVENING 450 tablet 3  . levothyroxine (SYNTHROID, LEVOTHROID) 150 MCG tablet TAKE 1 TABLET (150 MCG TOTAL) BY MOUTH DAILY. 90 tablet 3  .  NIFEdipine (PROCARDIA XL/ADALAT-CC) 60 MG 24 hr tablet TAKE 1 TABLET (60 MG TOTAL) BY MOUTH DAILY. 90 tablet 3  . potassium chloride SA (KLOR-CON M20) 20 MEQ tablet TAKE 1 TABLET BY MOUTH TWICE A DAY 180 tablet 3  . sildenafil (VIAGRA) 50 MG tablet Take 1 tablet (50 mg total) by mouth daily as needed for erectile dysfunction. 10  tablet 3  . furosemide (LASIX) 40 MG tablet Take 2 tablets (80 mg total) by mouth every morning. (Patient taking differently: Take 80 mg by mouth daily. ) 180 tablet 3  . metFORMIN (GLUCOPHAGE) 1000 MG tablet Take 1 tablet (1,000 mg total) by mouth 2 (two) times daily with a meal. 180 tablet 3   No current facility-administered medications on file prior to visit.     BP 120/70 (BP Location: Left Arm, Patient Position: Sitting, Cuff Size: Large)   Pulse 66   Temp 98.5 F (36.9 C) (Oral)   Ht _0  (1.702 m)   Wt 228 lb 6.4 oz (103.6 kg)   BMI 35.77 kg/m       Objective:   Physical Exam  Constitutional: He is oriented to person, place, and time. He appears well-developed and well-nourished. No distress.  HENT:  Head: Normocephalic and atraumatic.  Right Ear: External ear normal.  Left Ear: External ear normal.  Nose: Nose normal.  Mouth/Throat: Oropharynx is clear and moist. No oropharyngeal exudate.  Eyes: Pupils are equal, round, and reactive to light. Conjunctivae and EOM are normal. Right eye exhibits no discharge. Left eye exhibits no discharge. No scleral icterus.  Neck: Normal range of motion. Neck supple. No JVD present. No tracheal deviation present. No thyromegaly present.  Cardiovascular: Normal rate, regular rhythm and intact distal pulses. Exam reveals no gallop and no friction rub.  Murmur heard. Pulmonary/Chest: Effort normal and breath sounds normal. No stridor. No respiratory distress. He has no wheezes. He has no rales. He exhibits no tenderness.  Abdominal: Soft. Bowel sounds are normal. He exhibits no distension and no mass. There is no  tenderness. There is no rebound and no guarding. No hernia.  Musculoskeletal: Normal range of motion. He exhibits edema (+ 2 LLE edema). He exhibits no tenderness or deformity.  Lymphadenopathy:    He has no cervical adenopathy.  Neurological: He is alert and oriented to person, place, and time. He displays normal reflexes. No cranial nerve deficit or sensory deficit. He exhibits normal muscle tone. Coordination normal.  Skin: Skin is warm and dry. No rash noted. He is not diaphoretic. No erythema. No pallor.  Psychiatric: He has a normal mood and affect. His behavior is normal. Judgment and thought content normal.  Nursing note and vitals reviewed.     Assessment & Plan:  1. Type 2 diabetes mellitus without complication, without long-term current use of insulin ([place]) - Consider adding agent.  - Follow up in 6 months  - Encouraged heart healthy diet and exercise to aid in weight loss.  - Basic metabolic panel - CBC with Differential/Platelet - Hepatic function panel - Lipid panel - TSH - Hemoglobin A1c  2. Essential hypertension - Well controlled. No change in medications  - Basic metabolic panel - CBC with Differential/Platelet - Hepatic function panel - Lipid panel - TSH - Hemoglobin A1c - potassium chloride SA (KLOR-CON M20) 20 MEQ tablet; TAKE 1 TABLET BY MOUTH TWICE A DAY  Dispense: 180 tablet; Refill: 3 - NIFEdipine (PROCARDIA XL/ADALAT-CC) 60 MG 24 hr tablet; TAKE 1 TABLET (60 MG TOTAL) BY MOUTH DAILY.  Dispense: 90 tablet; Refill: 3 - labetalol (NORMODYNE) 200 MG tablet; TAKE 3 TABLETS BY MOUTH EVERY MORNING AND 2 EVERY EVENING  Dispense: 450 tablet; Refill: 3 - furosemide (LASIX) 40 MG tablet; Take 1 tablet (40 mg total) by mouth every morning.  Dispense: 90 tablet; Refill: 0 - fosinopril (MONOPRIL) 40 MG tablet; Take 2 tablets (80 mg  total) by mouth daily.  Dispense: 200 tablet; Refill: 3 - cloNIDine (CATAPRES) 0.2 MG tablet; TAKE 2 TABLETS BY MOUTH EVERY MORNING AND 2  TABLETS EVERY EVENING  Dispense: 360 tablet; Refill: 3  3. Benign prostatic hyperplasia with incomplete bladder emptying  - doxazosin (CARDURA) 8 MG tablet; TAKE 1 TABLET (8 MG TOTAL) BY MOUTH AT BEDTIME.  Dispense: 90 tablet; Refill: 3  4. Mixed hyperlipidemia  - atorvastatin (LIPITOR) 40 MG tablet; TAKE 1 TABLET (40 MG TOTAL) BY MOUTH DAILY.  Dispense: 90 tablet; Refill: 3  5. Elevated PSA  - PSA, Total and Free  * PSA total resulted with a level of 1,173 - will refer to Oncology  - Ambulatory referral to Oncology  6. Need for immunization against influenza  - Flu vaccine HIGH DOSE PF (Fluzone High dose)  [name], NP

## 2018-05-25 LAB — PSA, TOTAL AND FREE
PSA, Free: >17 ng/mL
PSA, TOTAL: 1173 ng/mL — AB (ref <=4.0)

## 2018-05-29 ENCOUNTER — Other Ambulatory Visit: Payer: Self-pay | Admitting: Adult Health

## 2018-05-29 DIAGNOSIS — R972 Elevated prostate specific antigen [PSA]: Secondary | ICD-10-CM

## 2018-06-02 DIAGNOSIS — R972 Elevated prostate specific antigen [PSA]: Secondary | ICD-10-CM | POA: Diagnosis not present

## 2018-06-06 DIAGNOSIS — R972 Elevated prostate specific antigen [PSA]: Secondary | ICD-10-CM | POA: Diagnosis not present

## 2018-06-06 DIAGNOSIS — C61 Malignant neoplasm of prostate: Secondary | ICD-10-CM | POA: Diagnosis not present

## 2018-06-09 ENCOUNTER — Other Ambulatory Visit: Payer: Self-pay | Admitting: Urology

## 2018-06-09 DIAGNOSIS — C61 Malignant neoplasm of prostate: Secondary | ICD-10-CM

## 2018-06-12 DIAGNOSIS — C61 Malignant neoplasm of prostate: Secondary | ICD-10-CM | POA: Diagnosis not present

## 2018-06-15 DIAGNOSIS — C778 Secondary and unspecified malignant neoplasm of lymph nodes of multiple regions: Secondary | ICD-10-CM | POA: Diagnosis not present

## 2018-06-15 DIAGNOSIS — C61 Malignant neoplasm of prostate: Secondary | ICD-10-CM | POA: Diagnosis not present

## 2018-06-20 ENCOUNTER — Telehealth: Payer: Self-pay | Admitting: Oncology

## 2018-06-20 NOTE — Telephone Encounter (Signed)
New referral received from Dr. Louis Meckel for high grade metastatic prostate cancer. Pt has been cld and scheduled to see Dr. Alen Blew on 11/1 at 2pm. Pt aware to arrive 30 minutes early.

## 2018-06-21 ENCOUNTER — Ambulatory Visit (HOSPITAL_COMMUNITY)
Admission: RE | Admit: 2018-06-21 | Discharge: 2018-06-21 | Disposition: A | Discharge status: Home / Self Care | Payer: Medicare Other | Source: Ambulatory Visit | Attending: Urology | Admitting: Urology

## 2018-06-21 ENCOUNTER — Encounter (HOSPITAL_COMMUNITY)
Admission: RE | Admit: 2018-06-21 | Discharge: 2018-06-21 | Disposition: A | Discharge status: Home / Self Care | Payer: Medicare Other | Source: Ambulatory Visit | Attending: Urology | Admitting: Urology

## 2018-06-21 DIAGNOSIS — C61 Malignant neoplasm of prostate: Secondary | ICD-10-CM | POA: Diagnosis not present

## 2018-06-21 DIAGNOSIS — R972 Elevated prostate specific antigen [PSA]: Secondary | ICD-10-CM | POA: Diagnosis not present

## 2018-06-21 MED ORDER — TECHNETIUM TC 99M MEDRONATE IV KIT
21.4000 | PACK | Freq: Once | INTRAVENOUS | Status: AC | PRN
  Administered 2018-06-21: 21.4 via INTRAVENOUS

## 2018-06-23 ENCOUNTER — Telehealth: Payer: Self-pay

## 2018-06-23 ENCOUNTER — Encounter: Payer: Self-pay | Admitting: Oncology

## 2018-06-23 ENCOUNTER — Inpatient Hospital Stay: Payer: Medicare Other | Attending: Oncology | Admitting: Oncology

## 2018-06-23 DIAGNOSIS — I350 Nonrheumatic aortic (valve) stenosis: Secondary | ICD-10-CM | POA: Insufficient documentation

## 2018-06-23 DIAGNOSIS — Z87891 Personal history of nicotine dependence: Secondary | ICD-10-CM | POA: Diagnosis not present

## 2018-06-23 DIAGNOSIS — Z7189 Other specified counseling: Secondary | ICD-10-CM

## 2018-06-23 DIAGNOSIS — E669 Obesity, unspecified: Secondary | ICD-10-CM | POA: Insufficient documentation

## 2018-06-23 DIAGNOSIS — R972 Elevated prostate specific antigen [PSA]: Secondary | ICD-10-CM

## 2018-06-23 DIAGNOSIS — Z7982 Long term (current) use of aspirin: Secondary | ICD-10-CM | POA: Insufficient documentation

## 2018-06-23 DIAGNOSIS — E119 Type 2 diabetes mellitus without complications: Secondary | ICD-10-CM | POA: Insufficient documentation

## 2018-06-23 DIAGNOSIS — C61 Malignant neoplasm of prostate: Secondary | ICD-10-CM | POA: Diagnosis not present

## 2018-06-23 DIAGNOSIS — E785 Hyperlipidemia, unspecified: Secondary | ICD-10-CM | POA: Insufficient documentation

## 2018-06-23 DIAGNOSIS — Z79899 Other long term (current) drug therapy: Secondary | ICD-10-CM | POA: Diagnosis not present

## 2018-06-23 DIAGNOSIS — Z7984 Long term (current) use of oral hypoglycemic drugs: Secondary | ICD-10-CM | POA: Insufficient documentation

## 2018-06-23 DIAGNOSIS — Z5111 Encounter for antineoplastic chemotherapy: Secondary | ICD-10-CM | POA: Insufficient documentation

## 2018-06-23 DIAGNOSIS — C786 Secondary malignant neoplasm of retroperitoneum and peritoneum: Secondary | ICD-10-CM | POA: Insufficient documentation

## 2018-06-23 DIAGNOSIS — Z7689 Persons encountering health services in other specified circumstances: Secondary | ICD-10-CM | POA: Insufficient documentation

## 2018-06-23 MED ORDER — PROCHLORPERAZINE MALEATE 10 MG PO TABS: 10.0000 mg | ORAL_TABLET | Freq: Four times a day (QID) | ORAL | 0 refills | Status: DC | PRN

## 2018-06-23 MED ORDER — LIDOCAINE-PRILOCAINE 2.5-2.5 % EX CREA: 1.0000 "application " | TOPICAL_CREAM | CUTANEOUS | 0 refills | Status: DC | PRN

## 2018-06-23 NOTE — Progress Notes (Signed)
START ON PATHWAY REGIMEN - Prostate     A cycle is every 21 days:     Docetaxel   **Always confirm dose/schedule in your pharmacy ordering system**  Patient Characteristics: Adenocarcinoma, Distant Metastases, Hormone Naive Histology: Adenocarcinoma Therapeutic Status: Distant Metastases  Intent of Therapy: Non-Curative / Palliative Intent, Discussed with Patient 

## 2018-06-23 NOTE — Telephone Encounter (Signed)
Printed avs and calender of upcoming appointment. Per 11/1 los 

## 2018-06-23 NOTE — Progress Notes (Signed)
Reason for the request:   Prostate cancer  HPI: I was asked by Dr. Louis Meckel to evaluate Mr. Matthew Serrano for diagnosis of prostate cancer.  He is a 72 year old man with history of diabetes, hyperlipidemia and aortic stenosis.  He was found to have an elevated PSA dating back to 2013 that time his PSA was 5.28 his PSA increased to 11.93 2015 and was up to 94.69 in September 2018.  In October 2019 his PSA was up to 1170 and was evaluated by Dr. Louis Meckel at that time.  A prostate biopsy obtained on 06/06/2018 showed high volume disease with a Gleason score 4+5 = 9 and the majority of the cores with Gleason score 8 in the remainder of these cores.  Staging work-up including CT scan of the abdomen and pelvis completed on June 12, 2018 showed extensive abdominal and pelvic adenopathy including iliac, presacral and periaortic retroperitoneal adenopathy.  He was started on Firmagon by Dr. Louis Meckel in October 2019 and scheduled for another injection in November 2019.  He was referred to me for evaluation regarding subsequent treatments.  Clinically, he is asymptomatic from his disease.  He denies any abdominal pain or distention.  He denies any urinary difficulties.  He denies any bone pain or pathological fractures.  He does not report any headaches, blurry vision, syncope or seizures. Does not report any fevers, chills or sweats.  Does not report any cough, wheezing or hemoptysis.  Does not report any chest pain, palpitation, orthopnea or leg edema.  Does not report any nausea, vomiting or abdominal pain.  Does not report any constipation or diarrhea.  Does not report any skeletal complaints.    Does not report frequency, urgency or hematuria.  Does not report any skin rashes or lesions. Does not report any heat or cold intolerance.  Does not report any lymphadenopathy or petechiae.  Does not report any anxiety or depression.  Remaining review of systems is negative.    Past Medical History:  Diagnosis Date  . Aortic  stenosis    Echo 8/18: mild LVH, EF 60-65, no RWMA, mild AS (mean 10, peak 19), MAC, trivial MR, normal RVSF   . DIABETES MELLITUS, TYPE II 07/06/2007  . ED (erectile dysfunction)   . Elevated PSA 01/20/2012  . Elevated PSA   . Heart murmur   . History of nuclear stress test    Myoview 8/18: EF 64, inferobasal thinning, no ischemia, low risk  . HYPERLIPIDEMIA 07/06/2007  . HYPERTENSION 07/06/2007  . HYPOTHYROIDISM 07/06/2007  . Leg pain    ABIs 8/18:  Normal   . Obesity   :  Past Surgical History:  Procedure Laterality Date  . COLONOSCOPY    . COLONOSCOPY W/ BIOPSIES AND POLYPECTOMY    . POLYPECTOMY    :   Current Outpatient Medications:  .  aspirin EC 81 MG tablet, Take 81 mg by mouth daily., Disp: , Rfl:  .  atorvastatin (LIPITOR) 40 MG tablet, TAKE 1 TABLET (40 MG TOTAL) BY MOUTH DAILY., Disp: 90 tablet, Rfl: 3 .  cloNIDine (CATAPRES) 0.2 MG tablet, TAKE 2 TABLETS BY MOUTH EVERY MORNING AND 2 TABLETS EVERY EVENING, Disp: 360 tablet, Rfl: 3 .  doxazosin (CARDURA) 8 MG tablet, TAKE 1 TABLET (8 MG TOTAL) BY MOUTH AT BEDTIME., Disp: 90 tablet, Rfl: 3 .  fosinopril (MONOPRIL) 40 MG tablet, Take 2 tablets (80 mg total) by mouth daily., Disp: 200 tablet, Rfl: 3 .  furosemide (LASIX) 40 MG tablet, Take 1 tablet (40 mg total)  by mouth every morning., Disp: 90 tablet, Rfl: 0 .  glipiZIDE (GLUCOTROL XL) 10 MG 24 hr tablet, TAKE 1 TABLET BY MOUTH TWICE A DAY, Disp: 180 tablet, Rfl: 0 .  labetalol (NORMODYNE) 200 MG tablet, TAKE 3 TABLETS BY MOUTH EVERY MORNING AND 2 EVERY EVENING, Disp: 450 tablet, Rfl: 3 .  levothyroxine (SYNTHROID, LEVOTHROID) 150 MCG tablet, TAKE 1 TABLET (150 MCG TOTAL) BY MOUTH DAILY., Disp: 90 tablet, Rfl: 3 .  metFORMIN (GLUCOPHAGE) 1000 MG tablet, Take 1 tablet (1,000 mg total) by mouth 2 (two) times daily with a meal., Disp: 180 tablet, Rfl: 3 .  NIFEdipine (PROCARDIA XL/ADALAT-CC) 60 MG 24 hr tablet, TAKE 1 TABLET (60 MG TOTAL) BY MOUTH DAILY., Disp: 90 tablet,  Rfl: 3 .  potassium chloride SA (KLOR-CON M20) 20 MEQ tablet, TAKE 1 TABLET BY MOUTH TWICE A DAY, Disp: 180 tablet, Rfl: 3 .  sildenafil (VIAGRA) 50 MG tablet, Take 1 tablet (50 mg total) by mouth daily as needed for erectile dysfunction., Disp: 10 tablet, Rfl: 3:  No Known Allergies:  Family History  Problem Relation Age of Onset  . Hypertension Other   . Breast cancer Sister   . Breast cancer Unknown        family history  . Breast cancer Brother        prostate ca  . Aneurysm Mother        Brain aneurysm   . Prostate cancer Father   . Colon cancer Neg Hx   :  Social History   Socioeconomic History  . Marital status: Widowed    Spouse name: Not on file  . Number of children: Not on file  . Years of education: Not on file  . Highest education level: Not on file  Occupational History  . Not on file  Social Needs  . Financial resource strain: Not on file  . Food insecurity:    Worry: Not on file    Inability: Not on file  . Transportation needs:    Medical: Not on file    Non-medical: Not on file  Tobacco Use  . Smoking status: Former Research scientist (life sciences)  . Smokeless tobacco: Never Used  . Tobacco comment: quit 2005  Substance and Sexual Activity  . Alcohol use: Yes    Alcohol/week: 21.0 standard drinks    Types: 21 Cans of beer per week  . Drug use: No  . Sexual activity: Not on file  Lifestyle  . Physical activity:    Days per week: Not on file    Minutes per session: Not on file  . Stress: Not on file  Relationships  . Social connections:    Talks on phone: Not on file    Gets together: Not on file    Attends religious service: Not on file    Active member of club or organization: Not on file    Attends meetings of clubs or organizations: Not on file    Relationship status: Not on file  . Intimate partner violence:    Fear of current or ex partner: Not on file    Emotionally abused: Not on file    Physically abused: Not on file    Forced sexual activity: Not on  file  Other Topics Concern  . Not on file  Social History Narrative   He works as a Freight forwarder    Not married    No kids         :  Pertinent items are noted  in HPI. Blood pressure (!) 151/83, pulse 60, temperature 98.7 F (37.1 C), temperature source Oral, resp. rate 17, height _0  (1.702 m), weight 227 lb 12.8 oz (103.3 kg), SpO2 100 %.   Exam: ECOG 1 General appearance: alert and cooperative appeared without distress. Head: atraumatic without any abnormalities. Eyes: conjunctivae/corneas clear. PERRL.  Sclera anicteric. Throat: lips, mucosa, and tongue normal; without oral thrush or ulcers. Resp: clear to auscultation bilaterally without rhonchi, wheezes or dullness to percussion. Cardio: regular rate and rhythm, S1, S2 normal, systolic ejection murmur noted.  Left leg edema more than right noted. GI: soft, non-tender; bowel sounds normal; no masses,  no organomegaly Skin: Skin color, texture, turgor normal. No rashes or lesions Lymph nodes: Cervical, supraclavicular, and axillary nodes normal. Neurologic: Grossly normal without any motor, sensory or deep tendon reflexes. Musculoskeletal: No joint deformity or effusion.  CBC    Component Value Date/Time   WBC 4.6 05/24/2018 1430   RBC 4.22 05/24/2018 1430   HGB 11.9 (L) 05/24/2018 1430   HGB 11.8 (L) 05/16/2018 1126   HCT 36.0 (L) 05/24/2018 1430   HCT 36.7 (L) 05/16/2018 1126   PLT 200.0 05/24/2018 1430   PLT 204 05/16/2018 1126   MCV 85.3 05/24/2018 1430   MCV 85 05/16/2018 1126   MCH 27.4 05/16/2018 1126   MCHC 33.0 05/24/2018 1430   RDW 15.0 05/24/2018 1430   RDW 14.8 05/16/2018 1126   LYMPHSABS 1.6 05/24/2018 1430   MONOABS 0.5 05/24/2018 1430   EOSABS 0.1 05/24/2018 1430   BASOSABS 0.1 05/24/2018 1430      Chemistry      Component Value Date/Time   NA 143 05/24/2018 1430   NA 143 05/16/2018 1126   K 4.2 05/24/2018 1430   CL 102 05/24/2018 1430   CO2 31 05/24/2018 1430   BUN 19 05/24/2018  1430   BUN 7 (L) 05/16/2018 1126   CREATININE 0.99 05/24/2018 1430      Component Value Date/Time   CALCIUM 9.1 05/24/2018 1430   ALKPHOS 51 05/24/2018 1430   AST 18 05/24/2018 1430   ALT 16 05/24/2018 1430   BILITOT 0.5 05/24/2018 1430       Nm Bone Scan Whole Body  Result Date: 06/22/2018 CLINICAL DATA:  History of prostate carcinoma with elevated PSA currently EXAM: NUCLEAR MEDICINE WHOLE BODY BONE SCAN TECHNIQUE: Whole body anterior and posterior images were obtained approximately 3 hours after intravenous injection of radiopharmaceutical. RADIOPHARMACEUTICALS:  21.4 mCi Technetium-74mMDP IV COMPARISON:  CT abdomen pelvis of 06/12/2017 FINDINGS: There is mild activity in the knees, wrists, elbows, and shoulder is most consistent with degenerative change. A small focus of activity is noted in both sternoclavicular joints also most likely degenerative in origin. No evidence of bone metastasis is seen. IMPRESSION: No evidence of bone metastasis. There are degenerative changes as noted above. Electronically Signed   By: PIvar DrapeM.D.   On: 06/22/2018 08:20    Assessment and Plan:   72year-old man with the following:  1.  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  His PSA at the time of diagnosis was 1117 with a Gleason score of 4+5 = 9.  The natural course of this disease was discussed today with the patient and additional treatment options were reviewed.  Treatment options in the setting was reviewed as well in detail.  He understands androgen deprivation therapy remains to cornerstone of treating this disease long-term.  Additional therapy  including Taxotere chemotherapy, Nicki Reaper among others were reviewed.  Risks and benefits as well as potential overall survival benefit was illustrated today.  After discussion today, given his high risk disease and his excellent performance status I  recommended proceeding with systemic chemotherapy.  Complication associated with Taxotere chemotherapy given at 75 mg/m every 3 weeks for 6 cycles were reviewed.  These complications include nausea, fatigue, myelosuppression, neuropathy and rarely neutropenia, neutropenic sepsis and need for hospitalization.  After discussion today he is agreeable to proceed given the benefit includes overall survival improvement and progression free survival improvement.  We will tentatively start in the next few weeks after chemo education class.  2.  IV access: Risks and benefits of Port-A-Cath insertion was reviewed today.  Complications include bleeding, thrombosis and infection were discussed.  He is agreeable to proceed at this time.  3.  Antiemetics: Prescription for Compazine was given to him with instructions how to use it.  4.  Androgen deprivation: His next Mills Koller is scheduled for November and we will start Lupron in December which will be continued indefinitely.  5.  Growth factor support: He will require growth factor support after each cycle of therapy given risk of neutropenia and sepsis.  6.  Goals of care and prognosis: Therapy is palliative but his performance status is excellent and aggressive therapy is warranted.  7.  Follow-up: We will be in the immediate future to start the first cycle of chemotherapy.  60  minutes was spent with the patient face-to-face today.  More than 50% of time was dedicated to reviewing disease status, imaging studies, discussing options of therapy as well as complications related to these therapies. .     Thank you for the referral. A copy of this consult has been forwarded to the requesting physician.

## 2018-06-28 ENCOUNTER — Inpatient Hospital Stay: Payer: Medicare Other

## 2018-07-04 ENCOUNTER — Other Ambulatory Visit: Payer: Self-pay | Admitting: Radiology

## 2018-07-05 ENCOUNTER — Encounter (HOSPITAL_COMMUNITY): Payer: Self-pay | Admitting: Interventional Radiology

## 2018-07-05 ENCOUNTER — Other Ambulatory Visit: Payer: Self-pay | Admitting: Oncology

## 2018-07-05 ENCOUNTER — Ambulatory Visit (HOSPITAL_COMMUNITY)
Admission: RE | Admit: 2018-07-05 | Discharge: 2018-07-05 | Disposition: A | Discharge status: Home / Self Care | Payer: Medicare Other | Source: Ambulatory Visit | Attending: Oncology | Admitting: Oncology

## 2018-07-05 ENCOUNTER — Other Ambulatory Visit: Payer: Self-pay

## 2018-07-05 DIAGNOSIS — Z79899 Other long term (current) drug therapy: Secondary | ICD-10-CM | POA: Diagnosis not present

## 2018-07-05 DIAGNOSIS — E119 Type 2 diabetes mellitus without complications: Secondary | ICD-10-CM | POA: Insufficient documentation

## 2018-07-05 DIAGNOSIS — Z9889 Other specified postprocedural states: Secondary | ICD-10-CM | POA: Diagnosis not present

## 2018-07-05 DIAGNOSIS — Z87891 Personal history of nicotine dependence: Secondary | ICD-10-CM | POA: Diagnosis not present

## 2018-07-05 DIAGNOSIS — Z79890 Hormone replacement therapy (postmenopausal): Secondary | ICD-10-CM | POA: Diagnosis not present

## 2018-07-05 DIAGNOSIS — N529 Male erectile dysfunction, unspecified: Secondary | ICD-10-CM | POA: Diagnosis not present

## 2018-07-05 DIAGNOSIS — Z803 Family history of malignant neoplasm of breast: Secondary | ICD-10-CM | POA: Diagnosis not present

## 2018-07-05 DIAGNOSIS — Z8249 Family history of ischemic heart disease and other diseases of the circulatory system: Secondary | ICD-10-CM | POA: Insufficient documentation

## 2018-07-05 DIAGNOSIS — C61 Malignant neoplasm of prostate: Secondary | ICD-10-CM | POA: Diagnosis not present

## 2018-07-05 DIAGNOSIS — R011 Cardiac murmur, unspecified: Secondary | ICD-10-CM | POA: Insufficient documentation

## 2018-07-05 DIAGNOSIS — I10 Essential (primary) hypertension: Secondary | ICD-10-CM | POA: Insufficient documentation

## 2018-07-05 DIAGNOSIS — E669 Obesity, unspecified: Secondary | ICD-10-CM | POA: Diagnosis not present

## 2018-07-05 DIAGNOSIS — E039 Hypothyroidism, unspecified: Secondary | ICD-10-CM | POA: Diagnosis not present

## 2018-07-05 DIAGNOSIS — Z7982 Long term (current) use of aspirin: Secondary | ICD-10-CM | POA: Diagnosis not present

## 2018-07-05 DIAGNOSIS — E785 Hyperlipidemia, unspecified: Secondary | ICD-10-CM | POA: Insufficient documentation

## 2018-07-05 DIAGNOSIS — Z8042 Family history of malignant neoplasm of prostate: Secondary | ICD-10-CM | POA: Insufficient documentation

## 2018-07-05 DIAGNOSIS — Z452 Encounter for adjustment and management of vascular access device: Secondary | ICD-10-CM | POA: Diagnosis not present

## 2018-07-05 DIAGNOSIS — Z7984 Long term (current) use of oral hypoglycemic drugs: Secondary | ICD-10-CM | POA: Insufficient documentation

## 2018-07-05 DIAGNOSIS — I350 Nonrheumatic aortic (valve) stenosis: Secondary | ICD-10-CM | POA: Insufficient documentation

## 2018-07-05 DIAGNOSIS — Z6835 Body mass index (BMI) 35.0-35.9, adult: Secondary | ICD-10-CM | POA: Insufficient documentation

## 2018-07-05 DIAGNOSIS — Z5111 Encounter for antineoplastic chemotherapy: Secondary | ICD-10-CM | POA: Diagnosis not present

## 2018-07-05 HISTORY — PX: IR IMAGING GUIDED PORT INSERTION: IMG5740

## 2018-07-05 LAB — CBC WITH DIFFERENTIAL/PLATELET
Abs Immature Granulocytes: 0.02 10*3/uL (ref 0.00–0.07)
BASOS PCT: 1 %
Basophils Absolute: 0 10*3/uL (ref 0.0–0.1)
EOS PCT: 3 %
Eosinophils Absolute: 0.2 10*3/uL (ref 0.0–0.5)
HCT: 37.5 % — ABNORMAL LOW (ref 39.0–52.0)
Hemoglobin: 11.8 g/dL — ABNORMAL LOW (ref 13.0–17.0)
Immature Granulocytes: 0 %
Lymphocytes Relative: 34 %
Lymphs Abs: 1.9 10*3/uL (ref 0.7–4.0)
MCH: 28 pg (ref 26.0–34.0)
MCHC: 31.5 g/dL (ref 30.0–36.0)
MCV: 89.1 fL (ref 80.0–100.0)
MONO ABS: 0.6 10*3/uL (ref 0.1–1.0)
MONOS PCT: 10 %
Neutro Abs: 2.8 10*3/uL (ref 1.7–7.7)
Neutrophils Relative %: 52 %
PLATELETS: 211 10*3/uL (ref 150–400)
RBC: 4.21 MIL/uL — AB (ref 4.22–5.81)
RDW: 13.9 % (ref 11.5–15.5)
WBC: 5.5 10*3/uL (ref 4.0–10.5)
nRBC: 0 % (ref 0.0–0.2)

## 2018-07-05 LAB — GLUCOSE, CAPILLARY: Glucose-Capillary: 99 mg/dL (ref 70–99)

## 2018-07-05 LAB — PROTIME-INR
INR: 0.94
Prothrombin Time: 12.4 s (ref 11.4–15.2)

## 2018-07-05 MED ORDER — CEFAZOLIN SODIUM-DEXTROSE 2-4 GM/100ML-% IV SOLN
2.0000 g | INTRAVENOUS | Status: AC
  Administered 2018-07-05: 2 g via INTRAVENOUS

## 2018-07-05 MED ORDER — CEFAZOLIN SODIUM-DEXTROSE 2-4 GM/100ML-% IV SOLN
INTRAVENOUS | Status: AC
  Filled 2018-07-05: qty 100

## 2018-07-05 MED ORDER — LIDOCAINE HCL (PF) 1 % IJ SOLN
INTRAMUSCULAR | Status: AC | PRN
  Administered 2018-07-05: 5 mL
  Administered 2018-07-05: 10 mL

## 2018-07-05 MED ORDER — HEPARIN SOD (PORK) LOCK FLUSH 100 UNIT/ML IV SOLN
INTRAVENOUS | Status: AC
  Filled 2018-07-05: qty 5

## 2018-07-05 MED ORDER — LIDOCAINE HCL 1 % IJ SOLN
INTRAMUSCULAR | Status: AC
  Filled 2018-07-05: qty 20

## 2018-07-05 MED ORDER — FENTANYL CITRATE (PF) 100 MCG/2ML IJ SOLN
INTRAMUSCULAR | Status: AC | PRN
  Administered 2018-07-05: 50 ug via INTRAVENOUS

## 2018-07-05 MED ORDER — SODIUM CHLORIDE 0.9 % IV SOLN
INTRAVENOUS | Status: DC
  Administered 2018-07-05: 14:00:00 via INTRAVENOUS

## 2018-07-05 MED ORDER — MIDAZOLAM HCL 2 MG/2ML IJ SOLN
INTRAMUSCULAR | Status: AC
  Filled 2018-07-05: qty 4

## 2018-07-05 MED ORDER — FENTANYL CITRATE (PF) 100 MCG/2ML IJ SOLN
INTRAMUSCULAR | Status: AC
  Filled 2018-07-05: qty 2

## 2018-07-05 MED ORDER — MIDAZOLAM HCL 2 MG/2ML IJ SOLN
INTRAMUSCULAR | Status: AC | PRN
  Administered 2018-07-05: 1 mg via INTRAVENOUS

## 2018-07-05 NOTE — Discharge Instructions (Signed)
Moderate Conscious Sedation, Adult, Care After °These instructions provide you with information about caring for yourself after your procedure. Your health care provider may also give you more specific instructions. Your treatment has been planned according to current medical practices, but problems sometimes occur. Call your health care provider if you have any problems or questions after your procedure. °What can I expect after the procedure? °After your procedure, it is common: °· To feel sleepy for several hours. °· To feel clumsy and have poor balance for several hours. °· To have poor judgment for several hours. °· To vomit if you eat too soon. ° °Follow these instructions at home: °For at least 24 hours after the procedure: ° °· Do not: °? Participate in activities where you could fall or become injured. °? Drive. °? Use heavy machinery. °? Drink alcohol. °? Take sleeping pills or medicines that cause drowsiness. °? Make important decisions or sign legal documents. °? Take care of children on your own. °· Rest. °Eating and drinking °· Follow the diet recommended by your health care provider. °· If you vomit: °? Drink water, juice, or soup when you can drink without vomiting. °? Make sure you have little or no nausea before eating solid foods. °General instructions °· Have a responsible adult stay with you until you are awake and alert. °· Take over-the-counter and prescription medicines only as told by your health care provider. °· If you smoke, do not smoke without supervision. °· Keep all follow-up visits as told by your health care provider. This is important. °Contact a health care provider if: °· You keep feeling nauseous or you keep vomiting. °· You feel light-headed. °· You develop a rash. °· You have a fever. °Get help right away if: °· You have trouble breathing. °This information is not intended to replace advice given to you by your health care provider. Make sure you discuss any questions you have  with your health care provider. °Document Released: 05/30/2013 Document Revised: 01/12/2016 Document Reviewed: 11/29/2015 °Elsevier Interactive Patient Education © 2018 [employer] °Implanted Port Insertion, Care After °This sheet gives you information about how to care for yourself after your procedure. Your health care provider may also give you more specific instructions. If you have problems or questions, contact your health care provider. °What can I expect after the procedure? °After your procedure, it is common to have: °· Discomfort at the port insertion site. °· Bruising on the skin over the port. This should improve over 3-4 days. ° °Follow these instructions at home: °Port care °· After your port is placed, you will get a manufacturer's information card. The card has information about your port. Keep this card with you at all times. °· Take care of the port as told by your health care provider. Ask your health care provider if you or a family member can get training for taking care of the port at home. A home health care nurse may also take care of the port. °· Make sure to remember what type of port you have. °Incision care °· Follow instructions from your health care provider about how to take care of your port insertion site. Make sure you: °? Wash your hands with soap and water before you change your bandage (dressing). If soap and water are not available, use hand sanitizer. °? Change your dressing as told by your health care provider. °? Leave stitches (sutures), skin glue, or adhesive strips in place. These skin closures may need to stay   in place for 2 weeks or longer. If adhesive strip edges start to loosen and curl up, you may trim the loose edges. Do not remove adhesive strips completely unless your health care provider tells you to do that. °· Check your port insertion site every day for signs of infection. Check for: °? More redness, swelling, or pain. °? More fluid or  blood. °? Warmth. °? Pus or a bad smell. °General instructions °· Do not take baths, swim, or use a hot tub until your health care provider approves. °· Do not lift anything that is heavier than 10 lb (4.5 kg) for a week, or as told by your health care provider. °· Ask your health care provider when it is okay to: °? Return to work or school. °? Resume usual physical activities or sports. °· Do not drive for 24 hours if you were given a medicine to help you relax (sedative). °· Take over-the-counter and prescription medicines only as told by your health care provider. °· Wear a medical alert bracelet in case of an emergency. This will tell any health care providers that you have a port. °· Keep all follow-up visits as told by your health care provider. This is important. °Contact a health care provider if: °· You cannot flush your port with saline as directed, or you cannot draw blood from the port. °· You have a fever or chills. °· You have more redness, swelling, or pain around your port insertion site. °· You have more fluid or blood coming from your port insertion site. °· Your port insertion site feels warm to the touch. °· You have pus or a bad smell coming from the port insertion site. °Get help right away if: °· You have chest pain or shortness of breath. °· You have bleeding from your port that you cannot control. °Summary °· Take care of the port as told by your health care provider. °· Change your dressing as told by your health care provider. °· Keep all follow-up visits as told by your health care provider. °This information is not intended to replace advice given to you by your health care provider. Make sure you discuss any questions you have with your health care provider. °Document Released: 05/30/2013 Document Revised: 06/30/2016 Document Reviewed: 06/30/2016 °Elsevier Interactive Patient Education © 2017 [employer] Inc. ° °

## 2018-07-05 NOTE — Procedures (Signed)
Interventional Radiology Procedure Note  Procedure: Placement of a right IJ approach single lumen PowerPort.  Tip is positioned at the superior cavoatrial junction and catheter is ready for immediate use.  Complications: None Recommendations:  - Ok to shower tomorrow - Do not submerge for 7 days - Routine line care   Signed,  Pal Shell S. Clevie Prout, DO   

## 2018-07-05 NOTE — Consult Note (Signed)
Chief Complaint: Patient was seen in consultation today for Port-A-Cath placement  Referring Physician(s): Wyatt Portela  Supervising Physician: Corrie Mckusick  Patient Status: [facility] - Out-pt  History of Present Illness: Matthew Kiang. is a 72 y.o. male with history of metastatic prostate carcinoma who presents today for Port-A-Cath placement for chemotherapy.  Additional medical history as listed below.  Past Medical History:  Diagnosis Date  . Aortic stenosis    Echo 8/18: mild LVH, EF 60-65, no RWMA, mild AS (mean 10, peak 19), MAC, trivial MR, normal RVSF   . DIABETES MELLITUS, TYPE II 07/06/2007  . ED (erectile dysfunction)   . Elevated PSA 01/20/2012  . Elevated PSA   . Heart murmur   . History of nuclear stress test    Myoview 8/18: EF 64, inferobasal thinning, no ischemia, low risk  . HYPERLIPIDEMIA 07/06/2007  . HYPERTENSION 07/06/2007  . HYPOTHYROIDISM 07/06/2007  . Leg pain    ABIs 8/18:  Normal   . Obesity     Past Surgical History:  Procedure Laterality Date  . COLONOSCOPY    . COLONOSCOPY W/ BIOPSIES AND POLYPECTOMY    . POLYPECTOMY      Allergies: Patient has no known allergies.  Medications: Prior to Admission medications   Medication Sig Start Date End Date Taking? Authorizing Provider  aspirin EC 81 MG tablet Take 81 mg by mouth daily.    [provider]  atorvastatin (LIPITOR) 40 MG tablet TAKE 1 TABLET (40 MG TOTAL) BY MOUTH DAILY. 05/24/18   [name], [name], NP  cloNIDine (CATAPRES) 0.2 MG tablet TAKE 2 TABLETS BY MOUTH EVERY MORNING AND 2 TABLETS EVERY EVENING 05/24/18   [name], [name], NP  doxazosin (CARDURA) 8 MG tablet TAKE 1 TABLET (8 MG TOTAL) BY MOUTH AT BEDTIME. 05/24/18   [name], [name], NP  fosinopril (MONOPRIL) 40 MG tablet Take 2 tablets (80 mg total) by mouth daily. 05/24/18   [name], [name], NP  furosemide (LASIX) 40 MG tablet Take 1 tablet (40 mg total) by mouth every morning. 05/24/18 08/22/18  [name], [name], NP    glipiZIDE (GLUCOTROL XL) 10 MG 24 hr tablet TAKE 1 TABLET BY MOUTH TWICE A DAY 05/15/18   [name], [name], NP  labetalol (NORMODYNE) 200 MG tablet TAKE 3 TABLETS BY MOUTH EVERY MORNING AND 2 EVERY EVENING 05/24/18   [name], [name], NP  levothyroxine (SYNTHROID, LEVOTHROID) 150 MCG tablet TAKE 1 TABLET (150 MCG TOTAL) BY MOUTH DAILY. 05/18/17   [name], [name], NP  lidocaine-prilocaine (EMLA) cream Apply 1 application topically as needed. 06/23/18   Wyatt Portela, MD  metFORMIN (GLUCOPHAGE) 1000 MG tablet Take 1 tablet (1,000 mg total) by mouth 2 (two) times daily with a meal. 05/18/17 07/05/18  [name], [name], NP  NIFEdipine (PROCARDIA XL/ADALAT-CC) 60 MG 24 hr tablet TAKE 1 TABLET (60 MG TOTAL) BY MOUTH DAILY. 05/24/18   [name], [name], NP  potassium chloride SA (KLOR-CON M20) 20 MEQ tablet TAKE 1 TABLET BY MOUTH TWICE A DAY 05/24/18   [name], [name], NP  prochlorperazine (COMPAZINE) 10 MG tablet Take 1 tablet (10 mg total) by mouth every 6 (six) hours as needed for nausea or vomiting. 06/23/18   Wyatt Portela, MD  sildenafil (VIAGRA) 50 MG tablet Take 1 tablet (50 mg total) by mouth daily as needed for erectile dysfunction. 05/18/17   [name], NP     Family History  Problem Relation Age of Onset  . Hypertension Other   . Breast cancer Sister   . Breast  cancer Unknown        family history  . Breast cancer Brother        prostate ca  . Aneurysm Mother        Brain aneurysm   . Prostate cancer Father   . Colon cancer Neg Hx     Social History   Socioeconomic History  . Marital status: Widowed    Spouse name: Not on file  . Number of children: Not on file  . Years of education: Not on file  . Highest education level: Not on file  Occupational History  . Not on file  Social Needs  . Financial resource strain: Not on file  . Food insecurity:    Worry: Not on file    Inability: Not on file  . Transportation needs:    Medical: Not on file    Non-medical: Not on file  Tobacco  Use  . Smoking status: Former Research scientist (life sciences)  . Smokeless tobacco: Never Used  . Tobacco comment: quit 2005  Substance and Sexual Activity  . Alcohol use: Yes    Alcohol/week: 21.0 standard drinks    Types: 21 Cans of beer per week  . Drug use: No  . Sexual activity: Not on file  Lifestyle  . Physical activity:    Days per week: Not on file    Minutes per session: Not on file  . Stress: Not on file  Relationships  . Social connections:    Talks on phone: Not on file    Gets together: Not on file    Attends religious service: Not on file    Active member of club or organization: Not on file    Attends meetings of clubs or organizations: Not on file    Relationship status: Not on file  Other Topics Concern  . Not on file  Social History Narrative   He works as a Freight forwarder    Not married    No kids             Review of Systems denies fever, headache, chest pain, dyspnea, cough, abdominal/back pain, nausea, vomiting or bleeding.  Vital Signs: blood pressure 155/78, heart rate 55, temperature 98.3, respirations 18, O2 sat 100% room air   Physical Exam awake, alert.  Chest clear to auscultation bilaterally.  Heart with slightly bradycardic but regular rhythm, positive murmur.  Abdomen  soft, positive bowel sounds, nontender.  Bilateral lower extremity edema noted greater on the left.  Imaging: Nm Bone Scan Whole Body  Result Date: 06/22/2018 CLINICAL DATA:  History of prostate carcinoma with elevated PSA currently EXAM: NUCLEAR MEDICINE WHOLE BODY BONE SCAN TECHNIQUE: Whole body anterior and posterior images were obtained approximately 3 hours after intravenous injection of radiopharmaceutical. RADIOPHARMACEUTICALS:  21.4 mCi Technetium-41mMDP IV COMPARISON:  CT abdomen pelvis of 06/12/2017 FINDINGS: There is mild activity in the knees, wrists, elbows, and shoulder is most consistent with degenerative change. A small focus of activity is noted in both sternoclavicular joints  also most likely degenerative in origin. No evidence of bone metastasis is seen. IMPRESSION: No evidence of bone metastasis. There are degenerative changes as noted above. Electronically Signed   By: PIvar DrapeM.D.   On: 06/22/2018 08:20    Labs:  CBC: Recent Labs    05/16/18 1126 05/24/18 1430 07/05/18 1326  WBC 4.7 4.6 5.5  HGB 11.8* 11.9* 11.8*  HCT 36.7* 36.0* 37.5*  PLT 204 200.0 211    COAGS: Recent Labs  07/05/18 1326  INR 0.94    BMP: Recent Labs    05/16/18 1126 05/24/18 1430  NA 143 143  K 4.1 4.2  CL 101 102  CO2 26 31  GLUCOSE 128* 118*  BUN 7* 19  CALCIUM 8.8 9.1  CREATININE 0.86 0.99  GFRNONAA 88  --   GFRAA 102  --     LIVER FUNCTION TESTS: Recent Labs    05/24/18 1430  BILITOT 0.5  AST 18  ALT 16  ALKPHOS 51  PROT 6.6  ALBUMIN 4.3    TUMOR MARKERS: No results for input(s): AFPTM, CEA, CA199, CHROMGRNA in the last 8760 hours.  Assessment and Plan:  72 y.o. male with history of metastatic prostate carcinoma who presents today for Port-A-Cath placement for chemotherapy.Risks and benefits of image guided port-a-catheter placement was discussed with the patient including, but not limited to bleeding, infection, pneumothorax, or fibrin sheath development and need for additional procedures.  All of the patient's questions were answered, patient is agreeable to proceed. Consent signed and in chart.     Thank you for this interesting consult.  I greatly enjoyed meeting Matthew Pezzullo. and look forward to participating in their care.  A copy of this report was sent to the requesting provider on this date.  Electronically Signed: D. Rowe Robert, PA-C 07/05/2018, 1:48 PM   I spent a total of  25 minutes   in face to face in clinical consultation, greater than 50% of which was counseling/coordinating care for port a cath placement

## 2018-07-07 ENCOUNTER — Encounter: Payer: Self-pay | Admitting: Oncology

## 2018-07-07 ENCOUNTER — Other Ambulatory Visit: Payer: Self-pay | Admitting: *Deleted

## 2018-07-07 ENCOUNTER — Encounter: Payer: Self-pay | Admitting: Medical Oncology

## 2018-07-07 ENCOUNTER — Inpatient Hospital Stay: Payer: Medicare Other

## 2018-07-07 VITALS — BP 128/70 | HR 59 | Temp 98.7°F | Resp 18

## 2018-07-07 DIAGNOSIS — Z5111 Encounter for antineoplastic chemotherapy: Secondary | ICD-10-CM | POA: Diagnosis not present

## 2018-07-07 DIAGNOSIS — C61 Malignant neoplasm of prostate: Secondary | ICD-10-CM | POA: Diagnosis not present

## 2018-07-07 DIAGNOSIS — Z7982 Long term (current) use of aspirin: Secondary | ICD-10-CM | POA: Diagnosis not present

## 2018-07-07 DIAGNOSIS — C786 Secondary malignant neoplasm of retroperitoneum and peritoneum: Secondary | ICD-10-CM | POA: Diagnosis not present

## 2018-07-07 DIAGNOSIS — E119 Type 2 diabetes mellitus without complications: Secondary | ICD-10-CM | POA: Diagnosis not present

## 2018-07-07 DIAGNOSIS — Z7689 Persons encountering health services in other specified circumstances: Secondary | ICD-10-CM | POA: Diagnosis not present

## 2018-07-07 DIAGNOSIS — Z7984 Long term (current) use of oral hypoglycemic drugs: Secondary | ICD-10-CM | POA: Diagnosis not present

## 2018-07-07 DIAGNOSIS — E669 Obesity, unspecified: Secondary | ICD-10-CM | POA: Diagnosis not present

## 2018-07-07 DIAGNOSIS — E785 Hyperlipidemia, unspecified: Secondary | ICD-10-CM | POA: Diagnosis not present

## 2018-07-07 DIAGNOSIS — Z87891 Personal history of nicotine dependence: Secondary | ICD-10-CM | POA: Diagnosis not present

## 2018-07-07 DIAGNOSIS — I350 Nonrheumatic aortic (valve) stenosis: Secondary | ICD-10-CM | POA: Diagnosis not present

## 2018-07-07 DIAGNOSIS — Z79899 Other long term (current) drug therapy: Secondary | ICD-10-CM | POA: Diagnosis not present

## 2018-07-07 DIAGNOSIS — R972 Elevated prostate specific antigen [PSA]: Secondary | ICD-10-CM | POA: Diagnosis not present

## 2018-07-07 LAB — CBC WITH DIFFERENTIAL (CANCER CENTER ONLY)
Abs Immature Granulocytes: 0.02 10*3/uL (ref 0.00–0.07)
BASOS ABS: 0 10*3/uL (ref 0.0–0.1)
Basophils Relative: 1 %
EOS ABS: 0.2 10*3/uL (ref 0.0–0.5)
EOS PCT: 3 %
HEMATOCRIT: 33.9 % — AB (ref 39.0–52.0)
Hemoglobin: 11 g/dL — ABNORMAL LOW (ref 13.0–17.0)
Immature Granulocytes: 0 %
Lymphocytes Relative: 24 %
Lymphs Abs: 1.4 10*3/uL (ref 0.7–4.0)
MCH: 27.6 pg (ref 26.0–34.0)
MCHC: 32.4 g/dL (ref 30.0–36.0)
MCV: 85.2 fL (ref 80.0–100.0)
Monocytes Absolute: 0.5 10*3/uL (ref 0.1–1.0)
Monocytes Relative: 9 %
NRBC: 0 % (ref 0.0–0.2)
Neutro Abs: 3.6 10*3/uL (ref 1.7–7.7)
Neutrophils Relative %: 63 %
Platelet Count: 191 10*3/uL (ref 150–400)
RBC: 3.98 MIL/uL — AB (ref 4.22–5.81)
RDW: 13.8 % (ref 11.5–15.5)
WBC Count: 5.7 10*3/uL (ref 4.0–10.5)

## 2018-07-07 LAB — CMP (CANCER CENTER ONLY)
ALBUMIN: 3.6 g/dL (ref 3.5–5.0)
ALK PHOS: 63 U/L (ref 38–126)
ALT: 27 U/L (ref 0–44)
AST: 20 U/L (ref 15–41)
Anion gap: 10 (ref 5–15)
BILIRUBIN TOTAL: 0.5 mg/dL (ref 0.3–1.2)
BUN: 14 mg/dL (ref 8–23)
CALCIUM: 9.2 mg/dL (ref 8.9–10.3)
CO2: 25 mmol/L (ref 22–32)
Chloride: 107 mmol/L (ref 98–111)
Creatinine: 0.95 mg/dL (ref 0.61–1.24)
GFR, Estimated: >60 mL/min (ref >60)
GLUCOSE: 210 mg/dL — AB (ref 70–99)
Potassium: 3.8 mmol/L (ref 3.5–5.1)
SODIUM: 142 mmol/L (ref 135–145)
TOTAL PROTEIN: 6.5 g/dL (ref 6.5–8.1)

## 2018-07-07 MED ORDER — DEXAMETHASONE SODIUM PHOSPHATE 10 MG/ML IJ SOLN
INTRAMUSCULAR | Status: AC
  Filled 2018-07-07: qty 1

## 2018-07-07 MED ORDER — SODIUM CHLORIDE 0.9 % IV SOLN
Freq: Once | INTRAVENOUS | Status: AC
  Administered 2018-07-07: 11:00:00 via INTRAVENOUS
  Filled 2018-07-07: qty 250

## 2018-07-07 MED ORDER — SODIUM CHLORIDE 0.9 % IV SOLN
75.0000 mg/m2 | Freq: Once | INTRAVENOUS | Status: AC
  Administered 2018-07-07: 170 mg via INTRAVENOUS
  Filled 2018-07-07: qty 17

## 2018-07-07 MED ORDER — SODIUM CHLORIDE 0.9% FLUSH
10.0000 mL | INTRAVENOUS | Status: DC | PRN
  Administered 2018-07-07: 10 mL
  Filled 2018-07-07: qty 10

## 2018-07-07 MED ORDER — HEPARIN SOD (PORK) LOCK FLUSH 100 UNIT/ML IV SOLN
500.0000 [IU] | Freq: Once | INTRAVENOUS | Status: AC | PRN
  Administered 2018-07-07: 500 [IU]
  Filled 2018-07-07: qty 5

## 2018-07-07 MED ORDER — DEXAMETHASONE SODIUM PHOSPHATE 10 MG/ML IJ SOLN
10.0000 mg | Freq: Once | INTRAMUSCULAR | Status: AC
  Administered 2018-07-07: 10 mg via INTRAVENOUS

## 2018-07-07 NOTE — Patient Instructions (Signed)
Implanted Port Home Guide An implanted port is a type of central line that is placed under the skin. Central lines are used to provide IV access when treatment or nutrition needs to be given through a person's veins. Implanted ports are used for long-term IV access. An implanted port may be placed because:  You need IV medicine that would be irritating to the small veins in your hands or arms.  You need long-term IV medicines, such as antibiotics.  You need IV nutrition for a long period.  You need frequent blood draws for lab tests.  You need dialysis.  Implanted ports are usually placed in the chest area, but they can also be placed in the upper arm, the abdomen, or the leg. An implanted port has two main parts:  Reservoir. The reservoir is round and will appear as a small, raised area under your skin. The reservoir is the part where a needle is inserted to give medicines or draw blood.  Catheter. The catheter is a thin, flexible tube that extends from the reservoir. The catheter is placed into a large vein. Medicine that is inserted into the reservoir goes into the catheter and then into the vein.  How will I care for my incision site? Do not get the incision site wet. Bathe or shower as directed by your health care provider. How is my port accessed? Special steps must be taken to access the port:  Before the port is accessed, a numbing cream can be placed on the skin. This helps numb the skin over the port site.  Your health care provider uses a sterile technique to access the port. ? Your health care provider must put on a mask and sterile gloves. ? The skin over your port is cleaned carefully with an antiseptic and allowed to dry. ? The port is gently pinched between sterile gloves, and a needle is inserted into the port.  Only "non-coring" port needles should be used to access the port. Once the port is accessed, a blood return should be checked. This helps ensure that the port  is in the vein and is not clogged.  If your port needs to remain accessed for a constant infusion, a clear (transparent) bandage will be placed over the needle site. The bandage and needle will need to be changed every week, or as directed by your health care provider.  Keep the bandage covering the needle clean and dry. Do not get it wet. Follow your health care provider's instructions on how to take a shower or bath while the port is accessed.  If your port does not need to stay accessed, no bandage is needed over the port.  What is flushing? Flushing helps keep the port from getting clogged. Follow your health care provider's instructions on how and when to flush the port. Ports are usually flushed with saline solution or a medicine called heparin. The need for flushing will depend on how the port is used.  If the port is used for intermittent medicines or blood draws, the port will need to be flushed: ? After medicines have been given. ? After blood has been drawn. ? As part of routine maintenance.  If a constant infusion is running, the port may not need to be flushed.  How long will my port stay implanted? The port can stay in for as long as your health care provider thinks it is needed. When it is time for the port to come out, surgery will be   done to remove it. The procedure is similar to the one performed when the port was put in. When should I seek immediate medical care? When you have an implanted port, you should seek immediate medical care if:  You notice a bad smell coming from the incision site.  You have swelling, redness, or drainage at the incision site.  You have more swelling or pain at the port site or the surrounding area.  You have a fever that is not controlled with medicine.  This information is not intended to replace advice given to you by your health care provider. Make sure you discuss any questions you have with your health care provider. Document  Released: 08/09/2005 Document Revised: 01/15/2016 Document Reviewed: 04/16/2013 Elsevier Interactive Patient Education  2017 [employer]  

## 2018-07-07 NOTE — Patient Instructions (Signed)
[facility] Discharge Instructions for Patients Receiving Chemotherapy  Today you received the following chemotherapy agents docetaxel (Taxotere)  To help prevent nausea and vomiting after your treatment, we encourage you to take your nausea medication as directed by your doctor.   If you develop nausea and vomiting that is not controlled by your nausea medication, call the clinic.   BELOW ARE SYMPTOMS THAT SHOULD BE REPORTED IMMEDIATELY:  *FEVER GREATER THAN 100.5 F  *CHILLS WITH OR WITHOUT FEVER  NAUSEA AND VOMITING THAT IS NOT CONTROLLED WITH YOUR NAUSEA MEDICATION  *UNUSUAL SHORTNESS OF BREATH  *UNUSUAL BRUISING OR BLEEDING  TENDERNESS IN MOUTH AND THROAT WITH OR WITHOUT PRESENCE OF ULCERS  *URINARY PROBLEMS  *BOWEL PROBLEMS  UNUSUAL RASH Items with * indicate a potential emergency and should be followed up as soon as possible.  Feel free to call the clinic should you have any questions or concerns. The clinic phone number is [phone].  Please show the [facility] at check-in to the Emergency Department and triage nurse.  Docetaxel injection What is this medicine? DOCETAXEL (doe se TAX el) is a chemotherapy drug. It targets fast dividing cells, like cancer cells, and causes these cells to die. This medicine is used to treat many types of cancers like breast cancer, certain stomach cancers, head and neck cancer, lung cancer, and prostate cancer. This medicine may be used for other purposes; ask your health care provider or pharmacist if you have questions. COMMON BRAND NAME(S): Docefrez, Taxotere What should I tell my health care provider before I take this medicine? They need to know if you have any of these conditions: -infection (especially a virus infection such as chickenpox, cold sores, or herpes) -liver disease -low blood counts, like low white cell, platelet, or red cell counts -an unusual or allergic reaction to docetaxel, polysorbate  80, other chemotherapy agents, other medicines, foods, dyes, or preservatives -pregnant or trying to get pregnant -breast-feeding How should I use this medicine? This drug is given as an infusion into a vein. It is administered in a hospital or clinic by a specially trained health care professional. Talk to your pediatrician regarding the use of this medicine in children. Special care may be needed. Overdosage: If you think you have taken too much of this medicine contact a poison control center or emergency room at once. NOTE: This medicine is only for you. Do not share this medicine with others. What if I miss a dose? It is important not to miss your dose. Call your doctor or health care professional if you are unable to keep an appointment. What may interact with this medicine? -cyclosporine -erythromycin -ketoconazole -medicines to increase blood counts like filgrastim, pegfilgrastim, sargramostim -vaccines Talk to your doctor or health care professional before taking any of these medicines: -acetaminophen -aspirin -ibuprofen -ketoprofen -naproxen This list may not describe all possible interactions. Give your health care provider a list of all the medicines, herbs, non-prescription drugs, or dietary supplements you use. Also tell them if you smoke, drink alcohol, or use illegal drugs. Some items may interact with your medicine. What should I watch for while using this medicine? Your condition will be monitored carefully while you are receiving this medicine. You will need important blood work done while you are taking this medicine. This drug may make you feel generally unwell. This is not uncommon, as chemotherapy can affect healthy cells as well as cancer cells. Report any side effects. Continue your course of treatment even though  you feel ill unless your doctor tells you to stop. In some cases, you may be given additional medicines to help with side effects. Follow all directions  for their use. Call your doctor or health care professional for advice if you get a fever, chills or sore throat, or other symptoms of a cold or flu. Do not treat yourself. This drug decreases your body's ability to fight infections. Try to avoid being around people who are sick. This medicine may increase your risk to bruise or bleed. Call your doctor or health care professional if you notice any unusual bleeding. This medicine may contain alcohol in the product. You may get drowsy or dizzy. Do not drive, use machinery, or do anything that needs mental alertness until you know how this medicine affects you. Do not stand or sit up quickly, especially if you are an older patient. This reduces the risk of dizzy or fainting spells. Avoid alcoholic drinks. Do not become pregnant while taking this medicine. Women should inform their doctor if they wish to become pregnant or think they might be pregnant. There is a potential for serious side effects to an unborn child. Talk to your health care professional or pharmacist for more information. Do not breast-feed an infant while taking this medicine. What side effects may I notice from receiving this medicine? Side effects that you should report to your doctor or health care professional as soon as possible: -allergic reactions like skin rash, itching or hives, swelling of the face, lips, or tongue -low blood counts - This drug may decrease the number of white blood cells, red blood cells and platelets. You may be at increased risk for infections and bleeding. -signs of infection - fever or chills, cough, sore throat, pain or difficulty passing urine -signs of decreased platelets or bleeding - bruising, pinpoint red spots on the skin, black, tarry stools, nosebleeds -signs of decreased red blood cells - unusually weak or tired, fainting spells, lightheadedness -breathing problems -fast or irregular heartbeat -low blood pressure -mouth sores -nausea and  vomiting -pain, swelling, redness or irritation at the injection site -pain, tingling, numbness in the hands or feet -swelling of the ankle, feet, hands -weight gain Side effects that usually do not require medical attention (report to your doctor or health care professional if they continue or are bothersome): -bone pain -complete hair loss including hair on your head, underarms, pubic hair, eyebrows, and eyelashes -diarrhea -excessive tearing -changes in the color of fingernails -loosening of the fingernails -nausea -muscle pain -red flush to skin -sweating -weak or tired This list may not describe all possible side effects. Call your doctor for medical advice about side effects. You may report side effects to FDA at 1-800-FDA-1088. Where should I keep my medicine? This drug is given in a hospital or clinic and will not be stored at home. NOTE: This sheet is a summary. It may not cover all possible information. If you have questions about this medicine, talk to your doctor, pharmacist, or health care provider.  2018 Elsevier/Gold Standard (2015-09-11 12:32:56)

## 2018-07-07 NOTE — Progress Notes (Signed)
Went to lobby to introduce myself as Arboriculturist and to offer available resources.  Briefly discussed the two in-house grants he may apply for [facility] and Prostate to help with transportation,medication and other personal household expenses. Gave my card for any additional financial questions or concerns and advised proof of household income would be needed. He verbalized understanding.

## 2018-07-08 ENCOUNTER — Inpatient Hospital Stay: Payer: Medicare Other

## 2018-07-08 VITALS — BP 142/72 | HR 70 | Temp 98.2°F | Resp 18

## 2018-07-08 DIAGNOSIS — C786 Secondary malignant neoplasm of retroperitoneum and peritoneum: Secondary | ICD-10-CM | POA: Diagnosis not present

## 2018-07-08 DIAGNOSIS — Z7689 Persons encountering health services in other specified circumstances: Secondary | ICD-10-CM | POA: Diagnosis not present

## 2018-07-08 DIAGNOSIS — Z87891 Personal history of nicotine dependence: Secondary | ICD-10-CM | POA: Diagnosis not present

## 2018-07-08 DIAGNOSIS — Z5111 Encounter for antineoplastic chemotherapy: Secondary | ICD-10-CM | POA: Diagnosis not present

## 2018-07-08 DIAGNOSIS — C61 Malignant neoplasm of prostate: Secondary | ICD-10-CM | POA: Diagnosis not present

## 2018-07-08 DIAGNOSIS — R972 Elevated prostate specific antigen [PSA]: Secondary | ICD-10-CM | POA: Diagnosis not present

## 2018-07-08 LAB — PROSTATE-SPECIFIC AG, SERUM (LABCORP): PROSTATE SPECIFIC AG, SERUM: 183 ng/mL — AB (ref 0.0–4.0)

## 2018-07-08 MED ORDER — PEGFILGRASTIM-CBQV 6 MG/0.6ML ~~LOC~~ SOSY
PREFILLED_SYRINGE | SUBCUTANEOUS | Status: AC
  Filled 2018-07-08: qty 0.6

## 2018-07-08 MED ORDER — PEGFILGRASTIM-CBQV 6 MG/0.6ML ~~LOC~~ SOSY
6.0000 mg | PREFILLED_SYRINGE | Freq: Once | SUBCUTANEOUS | Status: AC
  Administered 2018-07-08: 6 mg via SUBCUTANEOUS

## 2018-07-12 ENCOUNTER — Telehealth: Payer: Self-pay | Admitting: *Deleted

## 2018-07-12 NOTE — Telephone Encounter (Signed)
Spoke with patient. States he is doing well after his 1st chemo treatment on 07/07/18. Denies n/v. Eating and drinking well.

## 2018-07-12 NOTE — Telephone Encounter (Signed)
-----   Message from Johann Capers, RN sent at 07/07/2018  3:24 PM EST ----- Regarding: Alen Blew - First Time F/U [facility] - First time taxotere f/u call

## 2018-07-14 ENCOUNTER — Telehealth: Payer: Self-pay | Admitting: *Deleted

## 2018-07-14 NOTE — Telephone Encounter (Signed)
Medical records faxed to [facility]; release [phone]

## 2018-07-28 ENCOUNTER — Inpatient Hospital Stay: Payer: Medicare Other | Attending: Oncology

## 2018-07-28 ENCOUNTER — Telehealth: Payer: Self-pay | Admitting: Oncology

## 2018-07-28 ENCOUNTER — Inpatient Hospital Stay (HOSPITAL_BASED_OUTPATIENT_CLINIC_OR_DEPARTMENT_OTHER): Payer: Medicare Other | Admitting: Oncology

## 2018-07-28 ENCOUNTER — Inpatient Hospital Stay: Payer: Medicare Other

## 2018-07-28 VITALS — BP 120/78 | HR 73 | Temp 98.5°F | Resp 17 | Ht 67.0 in | Wt 222.9 lb

## 2018-07-28 DIAGNOSIS — R590 Localized enlarged lymph nodes: Secondary | ICD-10-CM | POA: Insufficient documentation

## 2018-07-28 DIAGNOSIS — Z79899 Other long term (current) drug therapy: Secondary | ICD-10-CM | POA: Insufficient documentation

## 2018-07-28 DIAGNOSIS — Z5111 Encounter for antineoplastic chemotherapy: Secondary | ICD-10-CM | POA: Diagnosis not present

## 2018-07-28 DIAGNOSIS — Z7689 Persons encountering health services in other specified circumstances: Secondary | ICD-10-CM | POA: Insufficient documentation

## 2018-07-28 DIAGNOSIS — C61 Malignant neoplasm of prostate: Secondary | ICD-10-CM | POA: Diagnosis not present

## 2018-07-28 LAB — CBC WITH DIFFERENTIAL (CANCER CENTER ONLY)
ABS IMMATURE GRANULOCYTES: 0.02 10*3/uL (ref 0.00–0.07)
BASOS ABS: 0.1 10*3/uL (ref 0.0–0.1)
Basophils Relative: 2 %
Eosinophils Absolute: 0.1 10*3/uL (ref 0.0–0.5)
Eosinophils Relative: 1 %
HEMATOCRIT: 32.6 % — AB (ref 39.0–52.0)
Hemoglobin: 10.4 g/dL — ABNORMAL LOW (ref 13.0–17.0)
Immature Granulocytes: 0 %
LYMPHS ABS: 1.5 10*3/uL (ref 0.7–4.0)
Lymphocytes Relative: 24 %
MCH: 27 pg (ref 26.0–34.0)
MCHC: 31.9 g/dL (ref 30.0–36.0)
MCV: 84.7 fL (ref 80.0–100.0)
Monocytes Absolute: 0.6 10*3/uL (ref 0.1–1.0)
Monocytes Relative: 9 %
NEUTROS ABS: 4.1 10*3/uL (ref 1.7–7.7)
NRBC: 0 % (ref 0.0–0.2)
Neutrophils Relative %: 64 %
PLATELETS: 313 10*3/uL (ref 150–400)
RBC: 3.85 MIL/uL — ABNORMAL LOW (ref 4.22–5.81)
RDW: 14.4 % (ref 11.5–15.5)
WBC Count: 6.4 10*3/uL (ref 4.0–10.5)

## 2018-07-28 LAB — CMP (CANCER CENTER ONLY)
ALT: 35 U/L (ref 0–44)
AST: 22 U/L (ref 15–41)
Albumin: 3.6 g/dL (ref 3.5–5.0)
Alkaline Phosphatase: 71 U/L (ref 38–126)
Anion gap: 12 (ref 5–15)
BUN: 19 mg/dL (ref 8–23)
CO2: 24 mmol/L (ref 22–32)
Calcium: 8.9 mg/dL (ref 8.9–10.3)
Chloride: 107 mmol/L (ref 98–111)
Creatinine: 0.96 mg/dL (ref 0.61–1.24)
GFR, Est AFR Am: >60 mL/min
GFR, Estimated: >60 mL/min
Glucose, Bld: 153 mg/dL — ABNORMAL HIGH (ref 70–99)
Potassium: 3.5 mmol/L (ref 3.5–5.1)
Sodium: 143 mmol/L (ref 135–145)
Total Bilirubin: 0.4 mg/dL (ref 0.3–1.2)
Total Protein: 6.4 g/dL — ABNORMAL LOW (ref 6.5–8.1)

## 2018-07-28 MED ORDER — DEXAMETHASONE SODIUM PHOSPHATE 10 MG/ML IJ SOLN
INTRAMUSCULAR | Status: AC
  Filled 2018-07-28: qty 1

## 2018-07-28 MED ORDER — SODIUM CHLORIDE 0.9% FLUSH
10.0000 mL | INTRAVENOUS | Status: DC | PRN
  Administered 2018-07-28: 10 mL
  Filled 2018-07-28: qty 10

## 2018-07-28 MED ORDER — HEPARIN SOD (PORK) LOCK FLUSH 100 UNIT/ML IV SOLN
500.0000 [IU] | Freq: Once | INTRAVENOUS | Status: AC | PRN
  Administered 2018-07-28: 500 [IU]
  Filled 2018-07-28: qty 5

## 2018-07-28 MED ORDER — PEGFILGRASTIM 6 MG/0.6ML ~~LOC~~ PSKT
6.0000 mg | PREFILLED_SYRINGE | Freq: Once | SUBCUTANEOUS | Status: AC
  Administered 2018-07-28: 6 mg via SUBCUTANEOUS

## 2018-07-28 MED ORDER — PEGFILGRASTIM 6 MG/0.6ML ~~LOC~~ PSKT
PREFILLED_SYRINGE | SUBCUTANEOUS | Status: AC
  Filled 2018-07-28: qty 0.6

## 2018-07-28 MED ORDER — SODIUM CHLORIDE 0.9 % IV SOLN
75.0000 mg/m2 | Freq: Once | INTRAVENOUS | Status: AC
  Administered 2018-07-28: 170 mg via INTRAVENOUS
  Filled 2018-07-28: qty 17

## 2018-07-28 MED ORDER — SODIUM CHLORIDE 0.9 % IV SOLN
Freq: Once | INTRAVENOUS | Status: AC
  Administered 2018-07-28: 13:00:00 via INTRAVENOUS
  Filled 2018-07-28: qty 250

## 2018-07-28 MED ORDER — DEXAMETHASONE SODIUM PHOSPHATE 10 MG/ML IJ SOLN
10.0000 mg | Freq: Once | INTRAMUSCULAR | Status: AC
  Administered 2018-07-28: 10 mg via INTRAVENOUS

## 2018-07-28 NOTE — Telephone Encounter (Signed)
Printed calendar and avs. °

## 2018-07-28 NOTE — Progress Notes (Signed)
Hematology and Oncology Follow Up Visit  Matthew Serrano [id number] [birth year] 72 y.o. 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  His PSA was 1170 and a Gleason score of 4+5 = 9.  His disease presenting with lymphadenopathy   Prior Therapy:  Status post prostate biopsy obtained on 06/06/2018 which showed high-volume disease with a Gleason score 4+5 = 9.  Current therapy:   Androgen deprivation therapy in the form of Lupron transitioning from [place] under the care of Dr. Louis Meckel.  Taxotere chemotherapy at 75 mg/m every 3 weeks started on 07/07/2018.  He is here for cycle 2.  Interim History: Mr. Matthew Serrano presents today for a follow-up.  Since the last visit, he reports no major changes at this time.  He tolerated the last cycle of chemotherapy without any complaints.  He denies any nausea, fatigue or infusion related complications.  He denies any worsening neuropathy or changes in his performance status.  He eats well although lost some weight as of late.  He does not report any headaches, blurry vision, syncope or seizures. Does not report any fevers, chills or sweats.  Does not report any cough, wheezing or hemoptysis.  Does not report any chest pain, palpitation, orthopnea or leg edema.  Does not report any nausea, vomiting or abdominal pain.  Does not report any constipation or diarrhea.  Does not report any skeletal complaints.    Does not report frequency, urgency or hematuria.  Does not report any skin rashes or lesions. Does not report any heat or cold intolerance.  Does not report any lymphadenopathy or petechiae.  Does not report any anxiety or depression.  Remaining review of systems is negative.    Medications: I have reviewed the patient's current medications.  Current Outpatient Medications  Medication Sig Dispense Refill  . aspirin EC 81 MG  tablet Take 81 mg by mouth daily.    Marland Kitchen atorvastatin (LIPITOR) 40 MG tablet TAKE 1 TABLET (40 MG TOTAL) BY MOUTH DAILY. 90 tablet 3  . cloNIDine (CATAPRES) 0.2 MG tablet TAKE 2 TABLETS BY MOUTH EVERY MORNING AND 2 TABLETS EVERY EVENING 360 tablet 3  . doxazosin (CARDURA) 8 MG tablet TAKE 1 TABLET (8 MG TOTAL) BY MOUTH AT BEDTIME. 90 tablet 3  . fosinopril (MONOPRIL) 40 MG tablet Take 2 tablets (80 mg total) by mouth daily. 200 tablet 3  . furosemide (LASIX) 40 MG tablet Take 1 tablet (40 mg total) by mouth every morning. 90 tablet 0  . glipiZIDE (GLUCOTROL XL) 10 MG 24 hr tablet TAKE 1 TABLET BY MOUTH TWICE A DAY 180 tablet 0  . labetalol (NORMODYNE) 200 MG tablet TAKE 3 TABLETS BY MOUTH EVERY MORNING AND 2 EVERY EVENING 450 tablet 3  . levothyroxine (SYNTHROID, LEVOTHROID) 150 MCG tablet TAKE 1 TABLET (150 MCG TOTAL) BY MOUTH DAILY. 90 tablet 3  . lidocaine-prilocaine (EMLA) cream Apply 1 application topically as needed. 30 g 0  . metFORMIN (GLUCOPHAGE) 1000 MG tablet Take 1 tablet (1,000 mg total) by mouth 2 (two) times daily with a meal. 180 tablet 3  . NIFEdipine (PROCARDIA XL/ADALAT-CC) 60 MG 24 hr tablet TAKE 1 TABLET (60 MG TOTAL) BY MOUTH DAILY. 90 tablet 3  . potassium chloride SA (KLOR-CON M20) 20 MEQ tablet TAKE 1 TABLET BY MOUTH TWICE A DAY 180 tablet 3  . prochlorperazine (COMPAZINE) 10 MG tablet Take 1 tablet (10 mg total) by mouth  every 6 (six) hours as needed for nausea or vomiting. 30 tablet 0  . sildenafil (VIAGRA) 50 MG tablet Take 1 tablet (50 mg total) by mouth daily as needed for erectile dysfunction. 10 tablet 3   No current facility-administered medications for this visit.    Facility-Administered Medications Ordered in Other Visits  Medication Dose Route Frequency Provider Last Rate Last Dose  . sodium chloride flush (NS) 0.9 % injection 10 mL  10 mL Intracatheter PRN Wyatt Portela, MD   10 mL at 07/28/18 1123     Allergies: No Known Allergies  Past Medical  History, Surgical history, Social history, and Family History were reviewed and updated.    Physical Exam: Blood pressure 120/78, pulse 73, temperature 98.5 F (36.9 C), temperature source Oral, resp. rate 17, height 5\' 7"  (1.702 m), weight 222 lb 14.4 oz (101.1 kg), SpO2 98 %.   ECOG: 0 General appearance: alert and cooperative appeared without distress. Head: Normocephalic, without obvious abnormality Oropharynx: No oral thrush or ulcers. Eyes: No scleral icterus.  Pupils are equal and round reactive to light. Lymph nodes: Cervical, supraclavicular, and axillary nodes normal. Heart:regular rate and rhythm, S1, S2 normal, no murmur, click, rub or gallop Lung:chest clear, no wheezing, rales, normal symmetric air entry Abdomin: soft, non-tender, without masses or organomegaly. Neurological: No motor, sensory deficits.  Intact deep tendon reflexes. Skin: No rashes or lesions.  No ecchymosis or petechiae. Musculoskeletal: No joint deformity or effusion. Psychiatric: Mood and affect are appropriate.    Lab Results: Lab Results  Component Value Date   WBC 5.7 07/07/2018   HGB 11.0 (L) 07/07/2018   HCT 33.9 (L) 07/07/2018   MCV 85.2 07/07/2018   PLT 191 07/07/2018     Chemistry      Component Value Date/Time   NA 142 07/07/2018 0803   NA 143 05/16/2018 1126   K 3.8 07/07/2018 0803   CL 107 07/07/2018 0803   CO2 25 07/07/2018 0803   BUN 14 07/07/2018 0803   BUN 7 (L) 05/16/2018 1126   CREATININE 0.95 07/07/2018 0803      Component Value Date/Time   CALCIUM 9.2 07/07/2018 0803   ALKPHOS 63 07/07/2018 0803   AST 20 07/07/2018 0803   ALT 27 07/07/2018 0803   BILITOT 0.5 07/07/2018 0803        Impression and Plan:  72 year old man with:  1.  Advanced prostate cancer that is currently Castration-sensitive with metastatic disease to pelvic and retroperitoneal adenopathy diagnosed in October 2019.    He is currently receiving Taxotere chemotherapy without any  complications after the first cycle of therapy.  Risks and benefits of continuing this treatment for a total of 6 cycles were discussed today.  Long-term complications including peripheral neuropathy, myelosuppression and infusion related complications.  He is agreeable to continue at this time.  We will continue to monitor his PSA in the interim.  2.  IV access: Port-A-Cath inserted without any complications at this time.  This remains in use at this time.  3.  Antiemetics: Compazine remains available to him without any issues.  4.  Androgen deprivation: He will be receiving Lupron indefinitely under the care of Dr. Louis Meckel.  5.  Goals of care and prognosis: His disease is incurable therapy is palliative.  Aggressive measures are warranted at this time given his excellent performance status.  6.  Follow-up: We will be in 3 weeks for the next cycle of therapy.  25  minutes was spent with the  patient face-to-face today.  More than 50% of time was dedicated to discussing his disease status, reviewing laboratory data and answering questions regarding complications to therapy and plan of care.      [name], MD 12/6/201911:41 AM

## 2018-07-28 NOTE — Patient Instructions (Signed)
[facility] Discharge Instructions for Patients Receiving Chemotherapy  Today you received the following chemotherapy agents docetaxel (Taxotere)  To help prevent nausea and vomiting after your treatment, we encourage you to take your nausea medication as directed by your doctor.   If you develop nausea and vomiting that is not controlled by your nausea medication, call the clinic.   BELOW ARE SYMPTOMS THAT SHOULD BE REPORTED IMMEDIATELY:  *FEVER GREATER THAN 100.5 F  *CHILLS WITH OR WITHOUT FEVER  NAUSEA AND VOMITING THAT IS NOT CONTROLLED WITH YOUR NAUSEA MEDICATION  *UNUSUAL SHORTNESS OF BREATH  *UNUSUAL BRUISING OR BLEEDING  TENDERNESS IN MOUTH AND THROAT WITH OR WITHOUT PRESENCE OF ULCERS  *URINARY PROBLEMS  *BOWEL PROBLEMS  UNUSUAL RASH Items with * indicate a potential emergency and should be followed up as soon as possible.  Feel free to call the clinic should you have any questions or concerns. The clinic phone number is [phone].  Please show the [facility] at check-in to the Emergency Department and triage nurse.  Docetaxel injection What is this medicine? DOCETAXEL (doe se TAX el) is a chemotherapy drug. It targets fast dividing cells, like cancer cells, and causes these cells to die. This medicine is used to treat many types of cancers like breast cancer, certain stomach cancers, head and neck cancer, lung cancer, and prostate cancer. This medicine may be used for other purposes; ask your health care provider or pharmacist if you have questions. COMMON BRAND NAME(S): Docefrez, Taxotere What should I tell my health care provider before I take this medicine? They need to know if you have any of these conditions: -infection (especially a virus infection such as chickenpox, cold sores, or herpes) -liver disease -low blood counts, like low white cell, platelet, or red cell counts -an unusual or allergic reaction to docetaxel, polysorbate  80, other chemotherapy agents, other medicines, foods, dyes, or preservatives -pregnant or trying to get pregnant -breast-feeding How should I use this medicine? This drug is given as an infusion into a vein. It is administered in a hospital or clinic by a specially trained health care professional. Talk to your pediatrician regarding the use of this medicine in children. Special care may be needed. Overdosage: If you think you have taken too much of this medicine contact a poison control center or emergency room at once. NOTE: This medicine is only for you. Do not share this medicine with others. What if I miss a dose? It is important not to miss your dose. Call your doctor or health care professional if you are unable to keep an appointment. What may interact with this medicine? -cyclosporine -erythromycin -ketoconazole -medicines to increase blood counts like filgrastim, pegfilgrastim, sargramostim -vaccines Talk to your doctor or health care professional before taking any of these medicines: -acetaminophen -aspirin -ibuprofen -ketoprofen -naproxen This list may not describe all possible interactions. Give your health care provider a list of all the medicines, herbs, non-prescription drugs, or dietary supplements you use. Also tell them if you smoke, drink alcohol, or use illegal drugs. Some items may interact with your medicine. What should I watch for while using this medicine? Your condition will be monitored carefully while you are receiving this medicine. You will need important blood work done while you are taking this medicine. This drug may make you feel generally unwell. This is not uncommon, as chemotherapy can affect healthy cells as well as cancer cells. Report any side effects. Continue your course of treatment even though  you feel ill unless your doctor tells you to stop. In some cases, you may be given additional medicines to help with side effects. Follow all directions  for their use. Call your doctor or health care professional for advice if you get a fever, chills or sore throat, or other symptoms of a cold or flu. Do not treat yourself. This drug decreases your body's ability to fight infections. Try to avoid being around people who are sick. This medicine may increase your risk to bruise or bleed. Call your doctor or health care professional if you notice any unusual bleeding. This medicine may contain alcohol in the product. You may get drowsy or dizzy. Do not drive, use machinery, or do anything that needs mental alertness until you know how this medicine affects you. Do not stand or sit up quickly, especially if you are an older patient. This reduces the risk of dizzy or fainting spells. Avoid alcoholic drinks. Do not become pregnant while taking this medicine. Women should inform their doctor if they wish to become pregnant or think they might be pregnant. There is a potential for serious side effects to an unborn child. Talk to your health care professional or pharmacist for more information. Do not breast-feed an infant while taking this medicine. What side effects may I notice from receiving this medicine? Side effects that you should report to your doctor or health care professional as soon as possible: -allergic reactions like skin rash, itching or hives, swelling of the face, lips, or tongue -low blood counts - This drug may decrease the number of white blood cells, red blood cells and platelets. You may be at increased risk for infections and bleeding. -signs of infection - fever or chills, cough, sore throat, pain or difficulty passing urine -signs of decreased platelets or bleeding - bruising, pinpoint red spots on the skin, black, tarry stools, nosebleeds -signs of decreased red blood cells - unusually weak or tired, fainting spells, lightheadedness -breathing problems -fast or irregular heartbeat -low blood pressure -mouth sores -nausea and  vomiting -pain, swelling, redness or irritation at the injection site -pain, tingling, numbness in the hands or feet -swelling of the ankle, feet, hands -weight gain Side effects that usually do not require medical attention (report to your doctor or health care professional if they continue or are bothersome): -bone pain -complete hair loss including hair on your head, underarms, pubic hair, eyebrows, and eyelashes -diarrhea -excessive tearing -changes in the color of fingernails -loosening of the fingernails -nausea -muscle pain -red flush to skin -sweating -weak or tired This list may not describe all possible side effects. Call your doctor for medical advice about side effects. You may report side effects to FDA at 1-800-FDA-1088. Where should I keep my medicine? This drug is given in a hospital or clinic and will not be stored at home. NOTE: This sheet is a summary. It may not cover all possible information. If you have questions about this medicine, talk to your doctor, pharmacist, or health care provider.  2018 Elsevier/Gold Standard (2015-09-11 12:32:56)

## 2018-07-29 LAB — PROSTATE-SPECIFIC AG, SERUM (LABCORP): PROSTATE SPECIFIC AG, SERUM: 115 ng/mL — AB (ref 0.0–4.0)

## 2018-07-31 ENCOUNTER — Telehealth: Payer: Self-pay

## 2018-07-31 NOTE — Telephone Encounter (Signed)
Contacted patient and made aware of PSA results. 

## 2018-07-31 NOTE — Telephone Encounter (Signed)
-----   Message from Wyatt Portela, MD sent at 07/31/2018  8:39 AM EST ----- Please let him know his PSA is down.

## 2018-08-10 ENCOUNTER — Other Ambulatory Visit: Payer: Self-pay | Admitting: Adult Health

## 2018-08-10 DIAGNOSIS — I10 Essential (primary) hypertension: Secondary | ICD-10-CM

## 2018-08-10 MED ORDER — LEVOTHYROXINE SODIUM 150 MCG PO TABS: ORAL_TABLET | ORAL | 0 refills | Status: DC

## 2018-08-10 NOTE — Telephone Encounter (Signed)
Copied from [place] [phone]. Topic: Quick Communication - Rx Refill/Question >> Aug 10, 2018  2:23 PM Bea Graff, NT wrote: Medication: levothyroxine (SYNTHROID, LEVOTHROID) 150 MCG tablet   Has the patient contacted their pharmacy? Yes.   (Agent: If no, request that the patient contact the pharmacy for the refill.) (Agent: If yes, when and what did the pharmacy advise?)  Preferred Pharmacy (with phone number or street name): [facility], [place] [phone] (Phone) [phone] (Fax)    Agent: Please be advised that RX refills may take up to 3 business days. We ask that you follow-up with your pharmacy.

## 2018-08-10 NOTE — Telephone Encounter (Signed)
Requested Prescriptions  Pending Prescriptions Disp Refills  . levothyroxine (SYNTHROID, LEVOTHROID) 150 MCG tablet 90 tablet 0    Sig: TAKE 1 TABLET (150 MCG TOTAL) BY MOUTH DAILY.     Endocrinology:  Hypothyroid Agents Failed - 08/10/2018  2:30 PM      Failed - TSH needs to be rechecked within 3 months after an abnormal result. Refill until TSH is due.      Passed - TSH in normal range and within 360 days    TSH  Date Value Ref Range Status  05/24/2018 4.41 0.35 - 4.50 uIU/mL Final         Passed - Valid encounter within last 12 months    Recent Outpatient Visits          2 months ago Type 2 diabetes mellitus without complication, without long-term current use of insulin ([place])   [facility] [place], NP   1 year ago Routine general medical examination at a health care facility   [facility], [place], NP   1 year ago Encounter to establish care   [employer] [facility], [place], NP   2 years ago Essential hypertension   [facility] at [place], MD   2 years ago Left inguinal hernia   [facility]rap[facility], [facility] at [facility], [place], MD

## 2018-08-18 ENCOUNTER — Inpatient Hospital Stay: Payer: Medicare Other

## 2018-08-18 ENCOUNTER — Telehealth: Payer: Self-pay

## 2018-08-18 ENCOUNTER — Inpatient Hospital Stay (HOSPITAL_BASED_OUTPATIENT_CLINIC_OR_DEPARTMENT_OTHER): Payer: Medicare Other | Admitting: Oncology

## 2018-08-18 VITALS — BP 108/60 | HR 76 | Temp 98.4°F | Resp 17 | Ht 67.0 in | Wt 226.0 lb

## 2018-08-18 DIAGNOSIS — Z79899 Other long term (current) drug therapy: Secondary | ICD-10-CM

## 2018-08-18 DIAGNOSIS — Z7689 Persons encountering health services in other specified circumstances: Secondary | ICD-10-CM | POA: Diagnosis not present

## 2018-08-18 DIAGNOSIS — C61 Malignant neoplasm of prostate: Secondary | ICD-10-CM

## 2018-08-18 DIAGNOSIS — R590 Localized enlarged lymph nodes: Secondary | ICD-10-CM

## 2018-08-18 DIAGNOSIS — Z5111 Encounter for antineoplastic chemotherapy: Secondary | ICD-10-CM | POA: Diagnosis not present

## 2018-08-18 LAB — CMP (CANCER CENTER ONLY)
ALT: 47 U/L — ABNORMAL HIGH (ref 0–44)
AST: 25 U/L (ref 15–41)
Albumin: 3.5 g/dL (ref 3.5–5.0)
Alkaline Phosphatase: 66 U/L (ref 38–126)
Anion gap: 8 (ref 5–15)
BUN: 15 mg/dL (ref 8–23)
CO2: 26 mmol/L (ref 22–32)
Calcium: 8.5 mg/dL — ABNORMAL LOW (ref 8.9–10.3)
Chloride: 108 mmol/L (ref 98–111)
Creatinine: 1.13 mg/dL (ref 0.61–1.24)
GFR, Estimated: >60 mL/min (ref >60)
Glucose, Bld: 233 mg/dL — ABNORMAL HIGH (ref 70–99)
Potassium: 3.6 mmol/L (ref 3.5–5.1)
Sodium: 142 mmol/L (ref 135–145)
TOTAL PROTEIN: 6 g/dL — AB (ref 6.5–8.1)
Total Bilirubin: 0.7 mg/dL (ref 0.3–1.2)

## 2018-08-18 LAB — CBC WITH DIFFERENTIAL (CANCER CENTER ONLY)
Abs Immature Granulocytes: 0.02 10*3/uL (ref 0.00–0.07)
BASOS ABS: 0.1 10*3/uL (ref 0.0–0.1)
BASOS PCT: 2 %
Eosinophils Absolute: 0 10*3/uL (ref 0.0–0.5)
Eosinophils Relative: 1 %
HCT: 31.2 % — ABNORMAL LOW (ref 39.0–52.0)
Hemoglobin: 9.9 g/dL — ABNORMAL LOW (ref 13.0–17.0)
Immature Granulocytes: 0 %
Lymphocytes Relative: 31 %
Lymphs Abs: 1.7 10*3/uL (ref 0.7–4.0)
MCH: 27.2 pg (ref 26.0–34.0)
MCHC: 31.7 g/dL (ref 30.0–36.0)
MCV: 85.7 fL (ref 80.0–100.0)
Monocytes Absolute: 0.6 10*3/uL (ref 0.1–1.0)
Monocytes Relative: 10 %
Neutro Abs: 3.1 10*3/uL (ref 1.7–7.7)
Neutrophils Relative %: 56 %
PLATELETS: 227 10*3/uL (ref 150–400)
RBC: 3.64 MIL/uL — AB (ref 4.22–5.81)
RDW: 15.9 % — ABNORMAL HIGH (ref 11.5–15.5)
WBC Count: 5.5 10*3/uL (ref 4.0–10.5)
nRBC: 0 % (ref 0.0–0.2)

## 2018-08-18 MED ORDER — SODIUM CHLORIDE 0.9% FLUSH
10.0000 mL | INTRAVENOUS | Status: DC | PRN
  Administered 2018-08-18: 10 mL
  Filled 2018-08-18: qty 10

## 2018-08-18 MED ORDER — PEGFILGRASTIM 6 MG/0.6ML ~~LOC~~ PSKT
6.0000 mg | PREFILLED_SYRINGE | Freq: Once | SUBCUTANEOUS | Status: AC
  Administered 2018-08-18: 6 mg via SUBCUTANEOUS

## 2018-08-18 MED ORDER — SODIUM CHLORIDE 0.9 % IV SOLN
Freq: Once | INTRAVENOUS | Status: AC
  Administered 2018-08-18: 09:00:00 via INTRAVENOUS
  Filled 2018-08-18: qty 250

## 2018-08-18 MED ORDER — SODIUM CHLORIDE 0.9 % IV SOLN
75.0000 mg/m2 | Freq: Once | INTRAVENOUS | Status: AC
  Administered 2018-08-18: 170 mg via INTRAVENOUS
  Filled 2018-08-18: qty 17

## 2018-08-18 MED ORDER — HEPARIN SOD (PORK) LOCK FLUSH 100 UNIT/ML IV SOLN
500.0000 [IU] | Freq: Once | INTRAVENOUS | Status: AC | PRN
  Administered 2018-08-18: 500 [IU]
  Filled 2018-08-18: qty 5

## 2018-08-18 MED ORDER — DEXAMETHASONE SODIUM PHOSPHATE 10 MG/ML IJ SOLN
10.0000 mg | Freq: Once | INTRAMUSCULAR | Status: AC
  Administered 2018-08-18: 10 mg via INTRAVENOUS

## 2018-08-18 MED ORDER — PEGFILGRASTIM 6 MG/0.6ML ~~LOC~~ PSKT
PREFILLED_SYRINGE | SUBCUTANEOUS | Status: AC
  Filled 2018-08-18: qty 0.6

## 2018-08-18 MED ORDER — DEXAMETHASONE SODIUM PHOSPHATE 10 MG/ML IJ SOLN
INTRAMUSCULAR | Status: AC
  Filled 2018-08-18: qty 1

## 2018-08-18 NOTE — Patient Instructions (Signed)
[facility] Discharge Instructions for Patients Receiving Chemotherapy  Today you received the following chemotherapy agents Taxotere To help prevent nausea and vomiting after your treatment, we encourage you to take your nausea medication as prescribed.   If you develop nausea and vomiting that is not controlled by your nausea medication, call the clinic.   BELOW ARE SYMPTOMS THAT SHOULD BE REPORTED IMMEDIATELY:  *FEVER GREATER THAN 100.5 F  *CHILLS WITH OR WITHOUT FEVER  NAUSEA AND VOMITING THAT IS NOT CONTROLLED WITH YOUR NAUSEA MEDICATION  *UNUSUAL SHORTNESS OF BREATH  *UNUSUAL BRUISING OR BLEEDING  TENDERNESS IN MOUTH AND THROAT WITH OR WITHOUT PRESENCE OF ULCERS  *URINARY PROBLEMS  *BOWEL PROBLEMS  UNUSUAL RASH Items with * indicate a potential emergency and should be followed up as soon as possible.  Feel free to call the clinic should you have any questions or concerns. The clinic phone number is [phone].  Please show the CHEMO ALERT CARD at check-in to the Emergency Department and triage nurse.   

## 2018-08-18 NOTE — Progress Notes (Addendum)
Hematology and Oncology Follow Up Visit  Matthew Serrano [id number] 1945-11-26 72 y.o. 08/18/2018 8:34 AM Matthew Serrano, Matthew Serrano, Matthew Rumps, NP   Principle Diagnosis: 72 year old man with advanced prostate cancer with lymphadenopathy diagnosed in October 2019.  He presented with castration-sensitive disease, PSA of 1170 and a Gleason score of 4+5 = 9.    Prior Therapy:  Status post prostate biopsy obtained on 06/06/2018 which showed high-volume disease with a Gleason score 4+5 = 9.  Current therapy:   Androgen deprivation therapy in the form of Lupron transitioning from [place] under the care of Dr. Louis Meckel.  Taxotere chemotherapy at 75 mg/m every 3 weeks started on 07/07/2018.  He completed 2 cycles of therapy and he is ready to proceed with cycle 3.  Interim History: Matthew Serrano returns today for a repeat evaluation.  Since the last visit, he continues to tolerate chemotherapy without any major complaints.  He did develop left lower extremity edema and lower extremity Dopplers rule out DVT.  He remains ambulatory without any issues with fatigue, nausea or peripheral neuropathy.  His performance status and quality of life remains maintained.  He does not report any headaches, blurry vision, syncope or seizures.  He denies any alteration mental status or confusion.  Does not report any fevers, chills or sweats.  Does not report any cough, wheezing or hemoptysis.  Does not report any chest pain, palpitation, orthopnea or leg edema.  Does not report any nausea, vomiting or distention. Does not report any changes in bowel habits. Does not report any arthralgias or myalgias.   Does not report frequency, urgency or hematuria.  Does not report any skin rashes or lesions. Does not report any bleeding or clotting tendency.  Does not report any skin rashes or lesions.  Does not report any mood changes.  Remaining review of systems is negative.    Medications: I have reviewed the patient's current  medications.  Current Outpatient Medications  Medication Sig Dispense Refill  . aspirin EC 81 MG tablet Take 81 mg by mouth daily.    Marland Kitchen atorvastatin (LIPITOR) 40 MG tablet TAKE 1 TABLET (40 MG TOTAL) BY MOUTH DAILY. 90 tablet 3  . cloNIDine (CATAPRES) 0.2 MG tablet TAKE 2 TABLETS BY MOUTH EVERY MORNING AND 2 TABLETS EVERY EVENING 360 tablet 3  . doxazosin (CARDURA) 8 MG tablet TAKE 1 TABLET (8 MG TOTAL) BY MOUTH AT BEDTIME. 90 tablet 3  . fosinopril (MONOPRIL) 40 MG tablet Take 2 tablets (80 mg total) by mouth daily. 200 tablet 3  . furosemide (LASIX) 40 MG tablet Take 1 tablet (40 mg total) by mouth every morning. 90 tablet 0  . glipiZIDE (GLUCOTROL XL) 10 MG 24 hr tablet TAKE 1 TABLET BY MOUTH TWICE A DAY 180 tablet 0  . labetalol (NORMODYNE) 200 MG tablet TAKE 3 TABLETS BY MOUTH EVERY MORNING AND 2 EVERY EVENING 450 tablet 3  . levothyroxine (SYNTHROID, LEVOTHROID) 150 MCG tablet TAKE 1 TABLET (150 MCG TOTAL) BY MOUTH DAILY. 90 tablet 0  . lidocaine-prilocaine (EMLA) cream Apply 1 application topically as needed. 30 g 0  . metFORMIN (GLUCOPHAGE) 1000 MG tablet Take 1 tablet (1,000 mg total) by mouth 2 (two) times daily with a meal. 180 tablet 3  . NIFEdipine (PROCARDIA XL/ADALAT-CC) 60 MG 24 hr tablet TAKE 1 TABLET (60 MG TOTAL) BY MOUTH DAILY. 90 tablet 3  . potassium chloride SA (KLOR-CON M20) 20 MEQ tablet TAKE 1 TABLET BY MOUTH TWICE A DAY 180 tablet 3  . prochlorperazine (  COMPAZINE) 10 MG tablet Take 1 tablet (10 mg total) by mouth every 6 (six) hours as needed for nausea or vomiting. 30 tablet 0  . sildenafil (VIAGRA) 50 MG tablet Take 1 tablet (50 mg total) by mouth daily as needed for erectile dysfunction. 10 tablet 3   No current facility-administered medications for this visit.    Facility-Administered Medications Ordered in Other Visits  Medication Dose Route Frequency Provider Last Rate Last Dose  . sodium chloride flush (NS) 0.9 % injection 10 mL  10 mL Intracatheter PRN  Wyatt Portela, MD   10 mL at 08/18/18 3557     Allergies: No Known Allergies  Past Medical History, Surgical history, Social history, and Family History were reviewed and updated.    Physical Exam:  Blood pressure 108/60, pulse 76, temperature 98.4 F (36.9 C), temperature source Oral, resp. rate 17, height 5\' 7"  (1.702 m), weight 226 lb (102.5 kg), SpO2 100 %.   ECOG: 0   General appearance: Alert, awake without any distress. Head: Atraumatic without abnormalities Oropharynx: Without any thrush or ulcers. Eyes: No scleral icterus. Lymph nodes: No lymphadenopathy noted in the cervical, supraclavicular, or axillary nodes Heart:regular rate and rhythm, without any murmurs or gallops.  1+ edema left more than the right lower extremity. Lung: Clear to auscultation without any rhonchi, wheezes or dullness to percussion. Abdomin: Soft, nontender without any shifting dullness or ascites. Musculoskeletal: No clubbing or cyanosis. Neurological: No motor or sensory deficits. Skin: No rashes or lesions.     Lab Results: Lab Results  Component Value Date   WBC 6.4 07/28/2018   HGB 10.4 (L) 07/28/2018   HCT 32.6 (L) 07/28/2018   MCV 84.7 07/28/2018   PLT 313 07/28/2018     Chemistry      Component Value Date/Time   NA 143 07/28/2018 1103   NA 143 05/16/2018 1126   K 3.5 07/28/2018 1103   CL 107 07/28/2018 1103   CO2 24 07/28/2018 1103   BUN 19 07/28/2018 1103   BUN 7 (L) 05/16/2018 1126   CREATININE 0.96 07/28/2018 1103      Component Value Date/Time   CALCIUM 8.9 07/28/2018 1103   ALKPHOS 71 07/28/2018 1103   AST 22 07/28/2018 1103   ALT 35 07/28/2018 1103   BILITOT 0.4 07/28/2018 1103      Results for Matthew Serrano, Matthew Serrano (MRN [id number]) as of 08/18/2018 08:37  Ref. Range 07/07/2018 08:03 07/28/2018 11:03  Prostate Specific Ag, Serum Latest Ref Range: 0.0 - 4.0 ng/mL 183.0 (H) 115.0 (H)    Impression and Plan:  72 year old man with:  1.   Castration-sensitive prostate cancer with adenopathy diagnosed in October 2019.    He remains on Taxotere chemotherapy without any major complications.  Risks and benefits of continuing this treatment on long-term complications were reiterated.  These include neutropenia, neutropenic sepsis, peripheral neuropathy among others.  The plan is to complete 6 cycles of therapy.  He is agreeable with this plan.    2.  IV access: Port-A-Cath remains in use without any issues.  3.  Antiemetics: No nausea or vomiting reported at this time.  Compazine is available to him.  4.  Androgen deprivation: I have recommended long-term androgen deprivation under the care of Dr. Louis Meckel.  Long-term issues associated with this treatment including weight gain, hot flashes among others.  5.  Goals of care and prognosis: Therapy remains palliative and his disease is incurable.  His performance status remains excellent and aggressive therapy  is warranted.  6.  Growth factor support: He has low risk of neutropenic sepsis and growth factor support will be held for now.  This will be introduced if he develops any element of neutropenia.  7.  Follow-up: We will be in 3 weeks for a repeat evaluation and the next cycle of therapy.  25  minutes was spent with the patient face-to-face today.  More than 50% of time was dedicated to reviewing his disease status, treatment options, complications related to chemotherapy and managing these complications.      [name], MD 12/27/20198:34 AM    Addendum/correction: Patient has been receiving growth factor support after each cycle and that will be in the form of Onpro which she will be continued at this time after each cycle of chemotherapy.   [name] MD 08/18/2018.

## 2018-08-18 NOTE — Telephone Encounter (Signed)
Printed avs and calender of [facility] appointment. Per 12/27 los

## 2018-08-19 ENCOUNTER — Inpatient Hospital Stay: Payer: Medicare Other

## 2018-08-19 LAB — PROSTATE-SPECIFIC AG, SERUM (LABCORP): Prostate Specific Ag, Serum: 56.4 ng/mL — ABNORMAL HIGH (ref 0.0–4.0)

## 2018-08-21 ENCOUNTER — Other Ambulatory Visit: Payer: Self-pay | Admitting: Adult Health

## 2018-08-21 ENCOUNTER — Telehealth: Payer: Self-pay

## 2018-08-21 NOTE — Telephone Encounter (Signed)
Left message with results and call back number if he has any questions or concerns.

## 2018-08-21 NOTE — Telephone Encounter (Signed)
Copied from [place] [id number]. Topic: Quick Communication - Rx Refill/Question >> Aug 21, 2018  1:00 PM Oneta Rack wrote:  Medication:  metFORMIN (GLUCOPHAGE) 1000 MG tablet and glipiZIDE (GLUCOTROL XL) 10 MG 24 hr tablet   Has the patient contacted their pharmacy?yes   (Agent: If yes, when and what did the pharmacy advise?) Pharmacist called stating fax was sent over on 08/12/18   Preferred Pharmacy (with phone number or street name): [facility], [place] [phone] (Phone) [phone] (Fax)   Agent: Please be advised that RX refills may take up to 3 business days. We ask that you follow-up with your pharmacy.

## 2018-08-21 NOTE — Telephone Encounter (Signed)
-----   Message from Matthew Portela, MD sent at 08/21/2018  8:55 AM EST ----- Please let him know his PSA is lower

## 2018-08-22 MED ORDER — METFORMIN HCL 1000 MG PO TABS: 1000.0000 mg | ORAL_TABLET | Freq: Two times a day (BID) | ORAL | 3 refills | Status: DC

## 2018-08-22 NOTE — Telephone Encounter (Signed)
Requested Prescriptions  Pending Prescriptions Disp Refills  . metFORMIN (GLUCOPHAGE) 1000 MG tablet 180 tablet 3    Sig: Take 1 tablet (1,000 mg total) by mouth 2 (two) times daily with a meal.     Endocrinology:  Diabetes - Biguanides Passed - 08/21/2018  6:00 PM      Passed - Cr in normal range and within 360 days    Creatinine  Date Value Ref Range Status  08/18/2018 1.13 0.61 - 1.24 mg/dL Final         Passed - HBA1C is between 0 and 7.9 and within 180 days    Hgb A1c MFr Bld  Date Value Ref Range Status  05/24/2018 6.4 4.6 - 6.5 % Final    Comment:    Glycemic Control Guidelines for People with Diabetes:Non Diabetic:  <6%Goal of Therapy: <7%Additional Action Suggested:  >8%          Passed - eGFR in normal range and within 360 days    GFR, Est AFR Am  Date Value Ref Range Status  08/18/2018 >60 >60 mL/min Final   GFR, Est Non Af Am  Date Value Ref Range Status  08/18/2018 >60 >60 mL/min Final   GFR  Date Value Ref Range Status  05/24/2018 95.90 >60.00 mL/min Final         Passed - Valid encounter within last 6 months    Recent Outpatient Visits          3 months ago Type 2 diabetes mellitus without complication, without long-term current use of insulin ([place])   [facility] at [place], NP   1 year ago Routine general medical examination at a health care facility   [employer] [facility], [place], NP   1 year ago Encounter to establish care   [employer] [facility], [place], NP   2 years ago Essential hypertension   Therapist, [facility] at [facility], [place], MD   2 years ago Left inguinal hernia   Therapist, [facility] at [facility], Jory Ee, MD

## 2018-08-24 ENCOUNTER — Telehealth: Payer: Self-pay | Admitting: Adult Health

## 2018-08-24 ENCOUNTER — Other Ambulatory Visit: Payer: Self-pay | Admitting: *Deleted

## 2018-08-24 ENCOUNTER — Other Ambulatory Visit: Payer: Self-pay | Admitting: Family Medicine

## 2018-08-24 DIAGNOSIS — E119 Type 2 diabetes mellitus without complications: Secondary | ICD-10-CM

## 2018-08-24 MED ORDER — GLIPIZIDE ER 10 MG PO TB24: ORAL_TABLET | ORAL | 0 refills | Status: DC

## 2018-08-24 NOTE — Telephone Encounter (Signed)
Sent to the pharmacy by e-scribe. 

## 2018-08-24 NOTE — Telephone Encounter (Signed)
Spoke with Ria Comment at [facility] regarding pt refill at the request of Raynelle Jan; the pt should have no additional refills because an appointment is needed; per the provider's note dated 05/24/18 the pt is to return in 6 months;  spoke with Alona Bene, Pharmacist at [facility]; informed her that the pt should not have any additional refills on the metformin that was filled and shipped on 08/22/18; she verbalizes understanding and will cancel the remaining refills on this medication; will route to office for notification

## 2018-08-24 NOTE — Telephone Encounter (Signed)
Noted. Nothing further needed. 

## 2018-08-24 NOTE — Progress Notes (Signed)
Spoke with Ria Comment at [facility] regarding pt refill at the request of Raynelle Jan; the pt should have no additional refills because an appointment is needed; per the provider's note dated 05/24/18 the pt is to return in 6 months;  spoke with Alona Bene, Pharmacist at [facility]; informed her that the pt should not have any additional refills on the metformin that was filled and shipped on 08/22/18; she verbalizes understanding and will cancel the remaining refills on this medication; will route to office for notification

## 2018-09-06 ENCOUNTER — Other Ambulatory Visit: Payer: Self-pay | Admitting: Adult Health

## 2018-09-06 DIAGNOSIS — I10 Essential (primary) hypertension: Secondary | ICD-10-CM

## 2018-09-06 NOTE — Telephone Encounter (Signed)
Sent to the pharmacy by e-scribe. 

## 2018-09-08 ENCOUNTER — Inpatient Hospital Stay: Payer: Medicare Other

## 2018-09-08 ENCOUNTER — Inpatient Hospital Stay: Payer: Medicare Other | Attending: Oncology

## 2018-09-08 ENCOUNTER — Telehealth: Payer: Self-pay | Admitting: Oncology

## 2018-09-08 ENCOUNTER — Inpatient Hospital Stay (HOSPITAL_BASED_OUTPATIENT_CLINIC_OR_DEPARTMENT_OTHER): Payer: Medicare Other | Admitting: Oncology

## 2018-09-08 VITALS — BP 125/68 | HR 80 | Temp 98.7°F | Resp 18 | Ht 67.0 in | Wt 222.3 lb

## 2018-09-08 DIAGNOSIS — C61 Malignant neoplasm of prostate: Secondary | ICD-10-CM | POA: Diagnosis not present

## 2018-09-08 DIAGNOSIS — Z5111 Encounter for antineoplastic chemotherapy: Secondary | ICD-10-CM | POA: Insufficient documentation

## 2018-09-08 DIAGNOSIS — C779 Secondary and unspecified malignant neoplasm of lymph node, unspecified: Secondary | ICD-10-CM

## 2018-09-08 LAB — CMP (CANCER CENTER ONLY)
ALT: 23 U/L (ref 0–44)
AST: 16 U/L (ref 15–41)
Albumin: 3.7 g/dL (ref 3.5–5.0)
Alkaline Phosphatase: 73 U/L (ref 38–126)
Anion gap: 12 (ref 5–15)
BUN: 12 mg/dL (ref 8–23)
CO2: 24 mmol/L (ref 22–32)
Calcium: 8.8 mg/dL — ABNORMAL LOW (ref 8.9–10.3)
Chloride: 107 mmol/L (ref 98–111)
Creatinine: 1.05 mg/dL (ref 0.61–1.24)
GFR, Est AFR Am: >60 mL/min (ref >60)
GFR, Estimated: >60 mL/min (ref >60)
Glucose, Bld: 206 mg/dL — ABNORMAL HIGH (ref 70–99)
Potassium: 3.9 mmol/L (ref 3.5–5.1)
SODIUM: 143 mmol/L (ref 135–145)
Total Bilirubin: 0.5 mg/dL (ref 0.3–1.2)
Total Protein: 6.3 g/dL — ABNORMAL LOW (ref 6.5–8.1)

## 2018-09-08 LAB — CBC WITH DIFFERENTIAL (CANCER CENTER ONLY)
Abs Immature Granulocytes: 0.04 10*3/uL (ref 0.00–0.07)
Basophils Absolute: 0.1 10*3/uL (ref 0.0–0.1)
Basophils Relative: 1 %
Eosinophils Absolute: 0.1 10*3/uL (ref 0.0–0.5)
Eosinophils Relative: 1 %
HEMATOCRIT: 30.9 % — AB (ref 39.0–52.0)
HEMOGLOBIN: 10.1 g/dL — AB (ref 13.0–17.0)
Immature Granulocytes: 1 %
LYMPHS ABS: 1.7 10*3/uL (ref 0.7–4.0)
LYMPHS PCT: 22 %
MCH: 28.1 pg (ref 26.0–34.0)
MCHC: 32.7 g/dL (ref 30.0–36.0)
MCV: 86.1 fL (ref 80.0–100.0)
Monocytes Absolute: 0.6 10*3/uL (ref 0.1–1.0)
Monocytes Relative: 7 %
Neutro Abs: 5.3 10*3/uL (ref 1.7–7.7)
Neutrophils Relative %: 68 %
Platelet Count: 248 10*3/uL (ref 150–400)
RBC: 3.59 MIL/uL — ABNORMAL LOW (ref 4.22–5.81)
RDW: 15.9 % — ABNORMAL HIGH (ref 11.5–15.5)
WBC Count: 7.8 10*3/uL (ref 4.0–10.5)
nRBC: 0 % (ref 0.0–0.2)

## 2018-09-08 MED ORDER — DEXAMETHASONE SODIUM PHOSPHATE 10 MG/ML IJ SOLN
10.0000 mg | Freq: Once | INTRAMUSCULAR | Status: AC
  Administered 2018-09-08: 10 mg via INTRAVENOUS

## 2018-09-08 MED ORDER — SODIUM CHLORIDE 0.9% FLUSH
10.0000 mL | INTRAVENOUS | Status: DC | PRN
  Administered 2018-09-08: 10 mL
  Filled 2018-09-08: qty 10

## 2018-09-08 MED ORDER — HEPARIN SOD (PORK) LOCK FLUSH 100 UNIT/ML IV SOLN
500.0000 [IU] | Freq: Once | INTRAVENOUS | Status: AC | PRN
  Administered 2018-09-08: 500 [IU]
  Filled 2018-09-08: qty 5

## 2018-09-08 MED ORDER — DEXAMETHASONE SODIUM PHOSPHATE 10 MG/ML IJ SOLN
INTRAMUSCULAR | Status: AC
  Filled 2018-09-08: qty 1

## 2018-09-08 MED ORDER — SODIUM CHLORIDE 0.9 % IV SOLN
Freq: Once | INTRAVENOUS | Status: AC
  Administered 2018-09-08: 11:00:00 via INTRAVENOUS
  Filled 2018-09-08: qty 250

## 2018-09-08 MED ORDER — PEGFILGRASTIM 6 MG/0.6ML ~~LOC~~ PSKT
PREFILLED_SYRINGE | SUBCUTANEOUS | Status: AC
  Filled 2018-09-08: qty 0.6

## 2018-09-08 MED ORDER — SODIUM CHLORIDE 0.9 % IV SOLN
75.0000 mg/m2 | Freq: Once | INTRAVENOUS | Status: AC
  Administered 2018-09-08: 170 mg via INTRAVENOUS
  Filled 2018-09-08: qty 17

## 2018-09-08 MED ORDER — PEGFILGRASTIM 6 MG/0.6ML ~~LOC~~ PSKT
6.0000 mg | PREFILLED_SYRINGE | Freq: Once | SUBCUTANEOUS | Status: AC
  Administered 2018-09-08: 6 mg via SUBCUTANEOUS

## 2018-09-08 NOTE — Progress Notes (Signed)
Hematology and Oncology Follow Up Visit  Matthew Serrano [id number] 10/20/1945 74 y.o. 09/08/2018 8:56 AM [name], [name], [name], NP   Principle Diagnosis: 73 year old man with castration-sensitive prostate cancer with disease to the lymph nodes diagnosed in October 2019.  He was found to have PSA of 1170 and a Gleason score of 4+5 = 9.    Prior Therapy:  Status post prostate biopsy obtained on 06/06/2018 which showed high-volume disease with a Gleason score 4+5 = 9.  Current therapy:   Androgen deprivation therapy in the form of Lupron transitioning from [place] under the care of Dr. Louis Meckel.  Taxotere chemotherapy at 75 mg/m every 3 weeks started on 07/07/2018.  He he is here for cycle 4 of therapy.  Interim History: Matthew Serrano presents today for a repeat evaluation.  Since last visit, he reports no major changes in his health.  He continues to tolerate chemotherapy without any new issues.  He denies any nausea, neuropathy or infusion related complications.  He does report some mild fatigue but overall symptoms are manageable.  He denies any bone pain or recent hospitalizations.  His quality of life remain excellent.  He does not report any headaches, blurry vision, syncope or seizures.  He denies any dizziness or lethargy..  Does not report any fevers, chills or sweats.  Does not report any cough, wheezing or hemoptysis.  Does not report any chest pain, palpitation, orthopnea or leg edema.  Does not report any nausea, vomiting or early satiety. Does not report any constipation or diarrhea. Does not report any joint pain or deformity.   Does not report frequency, urgency or hematuria.  Does not report any ecchymosis or bruising.  He denies any lymphadenopathy or clotting tendency.  Does not report any anxiety or depression.  Remaining review of systems is negative.    Medications: I have reviewed the patient's current medications.  Current Outpatient Medications  Medication  Sig Dispense Refill  . aspirin EC 81 MG tablet Take 81 mg by mouth daily.    Marland Kitchen atorvastatin (LIPITOR) 40 MG tablet TAKE 1 TABLET (40 MG TOTAL) BY MOUTH DAILY. 90 tablet 3  . cloNIDine (CATAPRES) 0.2 MG tablet TAKE 2 TABLETS BY MOUTH EVERY MORNING AND 2 TABLETS EVERY EVENING 360 tablet 3  . doxazosin (CARDURA) 8 MG tablet TAKE 1 TABLET (8 MG TOTAL) BY MOUTH AT BEDTIME. 90 tablet 3  . fosinopril (MONOPRIL) 40 MG tablet Take 2 tablets (80 mg total) by mouth daily. 200 tablet 3  . furosemide (LASIX) 40 MG tablet TAKE 2 TABLETS (80 MG TOTAL) BY MOUTH EVERY MORNING. 180 tablet 1  . glipiZIDE (GLUCOTROL XL) 10 MG 24 hr tablet TAKE 1 TABLET BY MOUTH TWICE A DAY 180 tablet 0  . labetalol (NORMODYNE) 200 MG tablet TAKE 3 TABLETS BY MOUTH EVERY MORNING AND 2 EVERY EVENING 450 tablet 3  . levothyroxine (SYNTHROID, LEVOTHROID) 150 MCG tablet TAKE 1 TABLET (150 MCG TOTAL) BY MOUTH DAILY. 90 tablet 0  . lidocaine-prilocaine (EMLA) cream Apply 1 application topically as needed. 30 g 0  . metFORMIN (GLUCOPHAGE) 1000 MG tablet Take 1 tablet (1,000 mg total) by mouth 2 (two) times daily with a meal. 180 tablet 3  . NIFEdipine (PROCARDIA XL/ADALAT-CC) 60 MG 24 hr tablet TAKE 1 TABLET (60 MG TOTAL) BY MOUTH DAILY. 90 tablet 3  . potassium chloride SA (KLOR-CON M20) 20 MEQ tablet TAKE 1 TABLET BY MOUTH TWICE A DAY 180 tablet 3  . prochlorperazine (COMPAZINE) 10 MG tablet  Take 1 tablet (10 mg total) by mouth every 6 (six) hours as needed for nausea or vomiting. 30 tablet 0  . sildenafil (VIAGRA) 50 MG tablet Take 1 tablet (50 mg total) by mouth daily as needed for erectile dysfunction. 10 tablet 3   No current facility-administered medications for this visit.    Facility-Administered Medications Ordered in Other Visits  Medication Dose Route Frequency Provider Last Rate Last Dose  . sodium chloride flush (NS) 0.9 % injection 10 mL  10 mL Intracatheter PRN Wyatt Portela, MD   10 mL at 09/08/18 0850     Allergies:  No Known Allergies  Past Medical History, Surgical history, Social history, and Family History were reviewed and updated.    Physical Exam:  Blood pressure 125/68, pulse 80, temperature 98.7 F (37.1 C), temperature source Oral, resp. rate 18, height 5\' 7"  (1.702 m), weight 222 lb 4.8 oz (100.8 kg), SpO2 99 %.    ECOG: 0   General appearance: Comfortable appearing without any discomfort Head: Normocephalic without any trauma Oropharynx: Mucous membranes are moist and pink without any thrush or ulcers. Eyes: Pupils are equal and round reactive to light. Lymph nodes: No cervical, supraclavicular, inguinal or axillary lymphadenopathy.   Heart:regular rate and rhythm.  S1 and S2 without leg edema. Lung: Clear without any rhonchi or wheezes.  No dullness to percussion. Abdomin: Soft, nontender, nondistended with good bowel sounds.  No hepatosplenomegaly. Musculoskeletal: No joint deformity or effusion.  Full range of motion noted. Neurological: No deficits noted on motor, sensory and deep tendon reflex exam. Skin: No petechial rash or dryness.  Appeared moist.       Lab Results: Lab Results  Component Value Date   WBC 5.5 08/18/2018   HGB 9.9 (L) 08/18/2018   HCT 31.2 (L) 08/18/2018   MCV 85.7 08/18/2018   PLT 227 08/18/2018     Chemistry      Component Value Date/Time   NA 142 08/18/2018 0802   NA 143 05/16/2018 1126   K 3.6 08/18/2018 0802   CL 108 08/18/2018 0802   CO2 26 08/18/2018 0802   BUN 15 08/18/2018 0802   BUN 7 (L) 05/16/2018 1126   CREATININE 1.13 08/18/2018 0802      Component Value Date/Time   CALCIUM 8.5 (L) 08/18/2018 0802   ALKPHOS 66 08/18/2018 0802   AST 25 08/18/2018 0802   ALT 47 (H) 08/18/2018 0802   BILITOT 0.7 08/18/2018 0802      Results for Matthew Serrano, Matthew Serrano (MRN [id number]) as of 09/08/2018 08:45  Ref. Range 07/07/2018 08:03 07/28/2018 11:03 08/18/2018 08:02  Prostate Specific Ag, Serum Latest Ref Range: 0.0 - 4.0 ng/mL 183.0 (H)  115.0 (H) 56.4 (H)     Impression and Plan:  73 year old man with:  1.  Advanced prostate cancer with lymphadenopathy that is currently castration-sensitive diagnosed in October 2019.    He is currently on Taxotere chemotherapy and completed 3 cycles of therapy.  His PSA continues to decline currently at 56.4.  Risks and benefits of continuing this therapy to complete 6 cycles were discussed today.  Long-term complications include neuropathy, edema and myelosuppression were discussed.  After discussion today is agreeable to proceed.    2.  IV access: Port-A-Cath has been used for chemotherapy without any incident.  3.  Antiemetics: Compazine is available to him without any recent nausea or vomiting.  4.  Androgen deprivation: He continues to receive Lupron under the care of Dr. Louis Meckel which I  recommended continuing indefinitely.  5.  Goals of care and prognosis: His performance status remains excellent and aggressive therapy is warranted.  Therapy is palliative however.  6.  Growth factor support: He will continue to receive Onpro growth factor support after each cycle.  7.  Follow-up: We will be in 3 weeks for the next cycle of therapy.  25  minutes was spent with the patient face-to-face today.  More than 50% of time was dedicated to discussing his disease status, treatment options, laboratory review and answering question regarding future plan of care.      [name], MD 1/17/20208:56 AM

## 2018-09-08 NOTE — Telephone Encounter (Signed)
Printed calendar and avs. °

## 2018-09-08 NOTE — Patient Instructions (Signed)
[facility] Discharge Instructions for Patients Receiving Chemotherapy  Today you received the following chemotherapy agents Taxotere To help prevent nausea and vomiting after your treatment, we encourage you to take your nausea medication as prescribed.   If you develop nausea and vomiting that is not controlled by your nausea medication, call the clinic.   BELOW ARE SYMPTOMS THAT SHOULD BE REPORTED IMMEDIATELY:  *FEVER GREATER THAN 100.5 F  *CHILLS WITH OR WITHOUT FEVER  NAUSEA AND VOMITING THAT IS NOT CONTROLLED WITH YOUR NAUSEA MEDICATION  *UNUSUAL SHORTNESS OF BREATH  *UNUSUAL BRUISING OR BLEEDING  TENDERNESS IN MOUTH AND THROAT WITH OR WITHOUT PRESENCE OF ULCERS  *URINARY PROBLEMS  *BOWEL PROBLEMS  UNUSUAL RASH Items with * indicate a potential emergency and should be followed up as soon as possible.  Feel free to call the clinic should you have any questions or concerns. The clinic phone number is [phone].  Please show the CHEMO ALERT CARD at check-in to the Emergency Department and triage nurse.   

## 2018-09-09 LAB — PROSTATE-SPECIFIC AG, SERUM (LABCORP): Prostate Specific Ag, Serum: 40.6 ng/mL — ABNORMAL HIGH (ref 0.0–4.0)

## 2018-09-11 ENCOUNTER — Telehealth: Payer: Self-pay | Admitting: *Deleted

## 2018-09-11 NOTE — Telephone Encounter (Signed)
-----   Message from Wyatt Portela, MD sent at 09/11/2018  8:57 AM EST ----- Please let him know his PSA is down.

## 2018-09-11 NOTE — Telephone Encounter (Signed)
Left message with note below 

## 2018-09-29 ENCOUNTER — Telehealth: Payer: Self-pay

## 2018-09-29 ENCOUNTER — Inpatient Hospital Stay: Payer: Medicare Other

## 2018-09-29 ENCOUNTER — Inpatient Hospital Stay: Payer: Medicare Other | Attending: Oncology | Admitting: Oncology

## 2018-09-29 VITALS — BP 112/67 | HR 80 | Temp 99.2°F | Resp 18 | Ht 67.0 in | Wt 221.7 lb

## 2018-09-29 DIAGNOSIS — C61 Malignant neoplasm of prostate: Secondary | ICD-10-CM | POA: Diagnosis not present

## 2018-09-29 DIAGNOSIS — Z79899 Other long term (current) drug therapy: Secondary | ICD-10-CM | POA: Diagnosis not present

## 2018-09-29 DIAGNOSIS — R591 Generalized enlarged lymph nodes: Secondary | ICD-10-CM | POA: Insufficient documentation

## 2018-09-29 DIAGNOSIS — Z5111 Encounter for antineoplastic chemotherapy: Secondary | ICD-10-CM | POA: Diagnosis not present

## 2018-09-29 LAB — CBC WITH DIFFERENTIAL (CANCER CENTER ONLY)
Abs Immature Granulocytes: 0.02 10*3/uL (ref 0.00–0.07)
Basophils Absolute: 0.1 10*3/uL (ref 0.0–0.1)
Basophils Relative: 1 %
EOS ABS: 0.1 10*3/uL (ref 0.0–0.5)
Eosinophils Relative: 1 %
HCT: 30.1 % — ABNORMAL LOW (ref 39.0–52.0)
Hemoglobin: 9.4 g/dL — ABNORMAL LOW (ref 13.0–17.0)
Immature Granulocytes: 0 %
Lymphocytes Relative: 18 %
Lymphs Abs: 1.3 10*3/uL (ref 0.7–4.0)
MCH: 27.3 pg (ref 26.0–34.0)
MCHC: 31.2 g/dL (ref 30.0–36.0)
MCV: 87.5 fL (ref 80.0–100.0)
Monocytes Absolute: 0.7 10*3/uL (ref 0.1–1.0)
Monocytes Relative: 10 %
Neutro Abs: 5.1 10*3/uL (ref 1.7–7.7)
Neutrophils Relative %: 70 %
Platelet Count: 316 10*3/uL (ref 150–400)
RBC: 3.44 MIL/uL — ABNORMAL LOW (ref 4.22–5.81)
RDW: 16.7 % — AB (ref 11.5–15.5)
WBC Count: 7.3 10*3/uL (ref 4.0–10.5)
nRBC: 0 % (ref 0.0–0.2)

## 2018-09-29 LAB — CMP (CANCER CENTER ONLY)
ALT: 23 U/L (ref 0–44)
AST: 17 U/L (ref 15–41)
Albumin: 3.6 g/dL (ref 3.5–5.0)
Alkaline Phosphatase: 81 U/L (ref 38–126)
Anion gap: 12 (ref 5–15)
BUN: 19 mg/dL (ref 8–23)
CO2: 26 mmol/L (ref 22–32)
Calcium: 8.9 mg/dL (ref 8.9–10.3)
Chloride: 105 mmol/L (ref 98–111)
Creatinine: 1.04 mg/dL (ref 0.61–1.24)
GFR, Est AFR Am: >60 mL/min
GFR, Estimated: >60 mL/min
Glucose, Bld: 142 mg/dL — ABNORMAL HIGH (ref 70–99)
Potassium: 4 mmol/L (ref 3.5–5.1)
Sodium: 143 mmol/L (ref 135–145)
Total Bilirubin: 0.9 mg/dL (ref 0.3–1.2)
Total Protein: 6.4 g/dL — ABNORMAL LOW (ref 6.5–8.1)

## 2018-09-29 MED ORDER — SODIUM CHLORIDE 0.9% FLUSH
10.0000 mL | INTRAVENOUS | Status: DC | PRN
  Administered 2018-09-29: 10 mL
  Filled 2018-09-29: qty 10

## 2018-09-29 MED ORDER — PEGFILGRASTIM 6 MG/0.6ML ~~LOC~~ PSKT
6.0000 mg | PREFILLED_SYRINGE | Freq: Once | SUBCUTANEOUS | Status: AC
  Administered 2018-09-29: 6 mg via SUBCUTANEOUS

## 2018-09-29 MED ORDER — SODIUM CHLORIDE 0.9 % IV SOLN
75.0000 mg/m2 | Freq: Once | INTRAVENOUS | Status: AC
  Administered 2018-09-29: 170 mg via INTRAVENOUS
  Filled 2018-09-29: qty 17

## 2018-09-29 MED ORDER — DEXAMETHASONE SODIUM PHOSPHATE 10 MG/ML IJ SOLN
10.0000 mg | Freq: Once | INTRAMUSCULAR | Status: AC
  Administered 2018-09-29: 10 mg via INTRAVENOUS

## 2018-09-29 MED ORDER — SODIUM CHLORIDE 0.9 % IV SOLN
Freq: Once | INTRAVENOUS | Status: AC
  Administered 2018-09-29: 09:00:00 via INTRAVENOUS
  Filled 2018-09-29: qty 250

## 2018-09-29 MED ORDER — DEXAMETHASONE SODIUM PHOSPHATE 10 MG/ML IJ SOLN
INTRAMUSCULAR | Status: AC
  Filled 2018-09-29: qty 1

## 2018-09-29 MED ORDER — HEPARIN SOD (PORK) LOCK FLUSH 100 UNIT/ML IV SOLN
500.0000 [IU] | Freq: Once | INTRAVENOUS | Status: AC | PRN
  Administered 2018-09-29: 500 [IU]
  Filled 2018-09-29: qty 5

## 2018-09-29 NOTE — Patient Instructions (Signed)
[facility] Discharge Instructions for Patients Receiving Chemotherapy  Today you received the following chemotherapy agents Taxotere To help prevent nausea and vomiting after your treatment, we encourage you to take your nausea medication as prescribed.   If you develop nausea and vomiting that is not controlled by your nausea medication, call the clinic.   BELOW ARE SYMPTOMS THAT SHOULD BE REPORTED IMMEDIATELY:  *FEVER GREATER THAN 100.5 F  *CHILLS WITH OR WITHOUT FEVER  NAUSEA AND VOMITING THAT IS NOT CONTROLLED WITH YOUR NAUSEA MEDICATION  *UNUSUAL SHORTNESS OF BREATH  *UNUSUAL BRUISING OR BLEEDING  TENDERNESS IN MOUTH AND THROAT WITH OR WITHOUT PRESENCE OF ULCERS  *URINARY PROBLEMS  *BOWEL PROBLEMS  UNUSUAL RASH Items with * indicate a potential emergency and should be followed up as soon as possible.  Feel free to call the clinic should you have any questions or concerns. The clinic phone number is [phone].  Please show the CHEMO ALERT CARD at check-in to the Emergency Department and triage nurse.   

## 2018-09-29 NOTE — Progress Notes (Signed)
Hematology and Oncology Follow Up Visit  Matthew Serrano [id number] [birth year] 73 y.o. 09/29/2018 8:19 AM [name], [name], [name], NP   Principle Diagnosis: 73 year old man with advanced prostate cancer with lymphadenopathy presented with a PSA of 1170 and a Gleason score 4+5 = 9 in October 2019.  He has currently castration-sensitive disease. Prior Therapy:  Status post prostate biopsy obtained on 06/06/2018 which showed high-volume disease with a Gleason score 4+5 = 9.  Current therapy:   Androgen deprivation therapy in the form of Lupron transitioning from [place] under the care of Dr. Louis Meckel.  Taxotere chemotherapy at 75 mg/m every 3 weeks started on 07/07/2018.  He he is here for cycle 5 of therapy.  Interim History: [name] is here for repeat evaluation.  Since the last visit, he received the last cycle of chemotherapy without any issues.  He does report some mild nausea but no vomiting or infusion related complications.  He denies any excessive fatigue or tiredness.  He denies any peripheral neuropathy or neurological issues.  He denies any bone pain or pathological fractures.  He denies any hospitalization or illnesses.  He denies any recurrent infections.  Patient denied any alteration mental status, neuropathy, confusion or dizziness.  Denies any headaches or lethargy.  Denies any night sweats, weight loss or changes in appetite.  Denied orthopnea, dyspnea on exertion or chest discomfort.  Denies shortness of breath, difficulty breathing hemoptysis or cough.  Denies any abdominal distention, nausea, early satiety or dyspepsia.  Denies any hematuria, frequency, dysuria or nocturia.  Denies any skin irritation, dryness or rash.  Denies any ecchymosis or petechiae.  Denies any lymphadenopathy or clotting.  Denies any heat or cold intolerance.  Denies any anxiety or depression.  Remaining review of system is negative.       Medications: I have reviewed the patient's  current medications.  Current Outpatient Medications  Medication Sig Dispense Refill  . aspirin EC 81 MG tablet Take 81 mg by mouth daily.    Marland Kitchen atorvastatin (LIPITOR) 40 MG tablet TAKE 1 TABLET (40 MG TOTAL) BY MOUTH DAILY. 90 tablet 3  . cholecalciferol (VITAMIN D3) 25 MCG (1000 UT) tablet Take 1,000 Units by mouth daily.    . cloNIDine (CATAPRES) 0.2 MG tablet TAKE 2 TABLETS BY MOUTH EVERY MORNING AND 2 TABLETS EVERY EVENING 360 tablet 3  . doxazosin (CARDURA) 8 MG tablet TAKE 1 TABLET (8 MG TOTAL) BY MOUTH AT BEDTIME. 90 tablet 3  . fosinopril (MONOPRIL) 40 MG tablet Take 2 tablets (80 mg total) by mouth daily. 200 tablet 3  . furosemide (LASIX) 40 MG tablet TAKE 2 TABLETS (80 MG TOTAL) BY MOUTH EVERY MORNING. 180 tablet 1  . glipiZIDE (GLUCOTROL XL) 10 MG 24 hr tablet TAKE 1 TABLET BY MOUTH TWICE A DAY 180 tablet 0  . labetalol (NORMODYNE) 200 MG tablet TAKE 3 TABLETS BY MOUTH EVERY MORNING AND 2 EVERY EVENING 450 tablet 3  . levothyroxine (SYNTHROID, LEVOTHROID) 150 MCG tablet TAKE 1 TABLET (150 MCG TOTAL) BY MOUTH DAILY. 90 tablet 0  . lidocaine-prilocaine (EMLA) cream Apply 1 application topically as needed. 30 g 0  . metFORMIN (GLUCOPHAGE) 1000 MG tablet Take 1 tablet (1,000 mg total) by mouth 2 (two) times daily with a meal. 180 tablet 3  . NIFEdipine (PROCARDIA XL/ADALAT-CC) 60 MG 24 hr tablet TAKE 1 TABLET (60 MG TOTAL) BY MOUTH DAILY. 90 tablet 3  . potassium chloride SA (KLOR-CON M20) 20 MEQ tablet TAKE 1 TABLET BY MOUTH  TWICE A DAY 180 tablet 3  . prochlorperazine (COMPAZINE) 10 MG tablet Take 1 tablet (10 mg total) by mouth every 6 (six) hours as needed for nausea or vomiting. 30 tablet 0  . sildenafil (VIAGRA) 50 MG tablet Take 1 tablet (50 mg total) by mouth daily as needed for erectile dysfunction. 10 tablet 3   No current facility-administered medications for this visit.      Allergies: No Known Allergies  Past Medical History, Surgical history, Social history, and Family  History were reviewed and updated.    Physical Exam:  Blood pressure 112/67, pulse 80, temperature 99.2 F (37.3 C), temperature source Oral, resp. rate 18, height 5\' 7"  (1.702 m), weight 221 lb 11.2 oz (100.6 kg), SpO2 99 %.    ECOG: 0   General appearance: Alert, awake without any distress. Head: Atraumatic without abnormalities Oropharynx: Without any thrush or ulcers. Eyes: No scleral icterus. Lymph nodes: No lymphadenopathy noted in the cervical, supraclavicular, or axillary nodes Heart:regular rate and rhythm, without any murmurs or gallops.   Lung: Clear to auscultation without any rhonchi, wheezes or dullness to percussion. Abdomin: Soft, nontender without any shifting dullness or ascites. Musculoskeletal: No clubbing or cyanosis. Neurological: No motor or sensory deficits. Skin: No rashes or lesions.       Lab Results: Lab Results  Component Value Date   WBC 7.3 09/29/2018   HGB 9.4 (L) 09/29/2018   HCT 30.1 (L) 09/29/2018   MCV 87.5 09/29/2018   PLT 316 09/29/2018     Chemistry      Component Value Date/Time   NA 143 09/08/2018 0855   NA 143 05/16/2018 1126   K 3.9 09/08/2018 0855   CL 107 09/08/2018 0855   CO2 24 09/08/2018 0855   BUN 12 09/08/2018 0855   BUN 7 (L) 05/16/2018 1126   CREATININE 1.05 09/08/2018 0855      Component Value Date/Time   CALCIUM 8.8 (L) 09/08/2018 0855   ALKPHOS 73 09/08/2018 0855   AST 16 09/08/2018 0855   ALT 23 09/08/2018 0855   BILITOT 0.5 09/08/2018 0855       Results for [place], Matthew Serrano (MRN [id number]) as of 09/29/2018 08:06  Ref. Range 08/18/2018 08:02 09/08/2018 08:55  Prostate Specific Ag, Serum Latest Ref Range: 0.0 - 4.0 ng/mL 56.4 (H) 40.6 (H)     Impression and Plan:  73 year old man with:  1.  Castration-sensitive prostate cancer with lymphadenopathy diagnosed in October 2019.    He is on Taxotere chemotherapy which she has tolerated very well and ready to proceed with cycle 5 treatment.   Risks and benefits of proceeding with this chemotherapy was discussed today.  Complications including nausea, fatigue, neuropathy and edema were reiterated.  He is agreeable to proceed and the plan is to complete 6 cycles of therapy.  His last cycle scheduled in 3 weeks.  His PSA continues to show reasonable response and decline.    2.  IV access: Port-A-Cath remains in place without any issues.  3.  Antiemetics: Very little nausea noted and Compazine is been effective in treating it.  4.  Androgen deprivation: I recommended continuing androgen deprivation indefinitely.  He is currently receiving under the care of Dr. Louis Meckel.  5.  Goals of care and prognosis: Aggressive therapy is warranted given his excellent performance status.  His disease is incurable however and therapy remains palliative.  6.  Growth factor support: He is at risk of developing neutropenic infection.  I recommended continuing Onpro  growth factor support after each cycle.  7.  Follow-up: We will be in 3 weeks for cycle 6 of therapy.  25  minutes was spent with the patient face-to-face today.  More than 50% of time was spent on reviewing laboratory data, disease status update, dealing with complications related to chemotherapy and coordinating plan of care.      [name], MD 2/7/20208:19 AM

## 2018-09-29 NOTE — Telephone Encounter (Signed)
Per 2/7 no los

## 2018-09-30 LAB — PROSTATE-SPECIFIC AG, SERUM (LABCORP): Prostate Specific Ag, Serum: 47.5 ng/mL — ABNORMAL HIGH (ref 0.0–4.0)

## 2018-10-12 ENCOUNTER — Other Ambulatory Visit: Payer: Self-pay | Admitting: Adult Health

## 2018-10-12 DIAGNOSIS — I10 Essential (primary) hypertension: Secondary | ICD-10-CM

## 2018-10-13 NOTE — Telephone Encounter (Signed)
Sent to the pharmacy by e-scribe. 

## 2018-10-20 ENCOUNTER — Inpatient Hospital Stay: Payer: Medicare Other

## 2018-10-20 ENCOUNTER — Telehealth: Payer: Self-pay | Admitting: Oncology

## 2018-10-20 ENCOUNTER — Inpatient Hospital Stay (HOSPITAL_BASED_OUTPATIENT_CLINIC_OR_DEPARTMENT_OTHER): Payer: Medicare Other | Admitting: Oncology

## 2018-10-20 VITALS — BP 108/61 | HR 79 | Temp 98.4°F | Resp 18 | Ht 67.0 in | Wt 216.9 lb

## 2018-10-20 DIAGNOSIS — R591 Generalized enlarged lymph nodes: Secondary | ICD-10-CM

## 2018-10-20 DIAGNOSIS — C61 Malignant neoplasm of prostate: Secondary | ICD-10-CM

## 2018-10-20 DIAGNOSIS — Z79899 Other long term (current) drug therapy: Secondary | ICD-10-CM

## 2018-10-20 DIAGNOSIS — Z5111 Encounter for antineoplastic chemotherapy: Secondary | ICD-10-CM | POA: Diagnosis not present

## 2018-10-20 LAB — CBC WITH DIFFERENTIAL (CANCER CENTER ONLY)
Abs Immature Granulocytes: 0.02 10*3/uL (ref 0.00–0.07)
Basophils Absolute: 0.1 10*3/uL (ref 0.0–0.1)
Basophils Relative: 2 %
EOS ABS: 0.2 10*3/uL (ref 0.0–0.5)
Eosinophils Relative: 3 %
HCT: 30 % — ABNORMAL LOW (ref 39.0–52.0)
Hemoglobin: 9.3 g/dL — ABNORMAL LOW (ref 13.0–17.0)
Immature Granulocytes: 0 %
Lymphocytes Relative: 19 %
Lymphs Abs: 1.3 10*3/uL (ref 0.7–4.0)
MCH: 27.4 pg (ref 26.0–34.0)
MCHC: 31 g/dL (ref 30.0–36.0)
MCV: 88.5 fL (ref 80.0–100.0)
Monocytes Absolute: 0.6 10*3/uL (ref 0.1–1.0)
Monocytes Relative: 9 %
Neutro Abs: 4.6 10*3/uL (ref 1.7–7.7)
Neutrophils Relative %: 67 %
Platelet Count: 290 10*3/uL (ref 150–400)
RBC: 3.39 MIL/uL — AB (ref 4.22–5.81)
RDW: 16.8 % — ABNORMAL HIGH (ref 11.5–15.5)
WBC: 6.8 10*3/uL (ref 4.0–10.5)
nRBC: 0 % (ref 0.0–0.2)

## 2018-10-20 LAB — CMP (CANCER CENTER ONLY)
ALT: 14 U/L (ref 0–44)
ANION GAP: 11 (ref 5–15)
AST: 16 U/L (ref 15–41)
Albumin: 3.5 g/dL (ref 3.5–5.0)
Alkaline Phosphatase: 81 U/L (ref 38–126)
BUN: 12 mg/dL (ref 8–23)
CO2: 24 mmol/L (ref 22–32)
Calcium: 9 mg/dL (ref 8.9–10.3)
Chloride: 107 mmol/L (ref 98–111)
Creatinine: 0.86 mg/dL (ref 0.61–1.24)
GFR, Est AFR Am: >60 mL/min (ref >60)
GFR, Estimated: >60 mL/min (ref >60)
Glucose, Bld: 134 mg/dL — ABNORMAL HIGH (ref 70–99)
Potassium: 3.6 mmol/L (ref 3.5–5.1)
SODIUM: 142 mmol/L (ref 135–145)
Total Bilirubin: 0.5 mg/dL (ref 0.3–1.2)
Total Protein: 6.4 g/dL — ABNORMAL LOW (ref 6.5–8.1)

## 2018-10-20 MED ORDER — PEGFILGRASTIM 6 MG/0.6ML ~~LOC~~ PSKT
6.0000 mg | PREFILLED_SYRINGE | Freq: Once | SUBCUTANEOUS | Status: AC
  Administered 2018-10-20: 6 mg via SUBCUTANEOUS

## 2018-10-20 MED ORDER — DEXAMETHASONE SODIUM PHOSPHATE 10 MG/ML IJ SOLN
INTRAMUSCULAR | Status: AC
  Filled 2018-10-20: qty 1

## 2018-10-20 MED ORDER — SODIUM CHLORIDE 0.9% FLUSH
10.0000 mL | INTRAVENOUS | Status: DC | PRN
  Administered 2018-10-20: 10 mL
  Filled 2018-10-20: qty 10

## 2018-10-20 MED ORDER — HEPARIN SOD (PORK) LOCK FLUSH 100 UNIT/ML IV SOLN
500.0000 [IU] | Freq: Once | INTRAVENOUS | Status: AC | PRN
  Administered 2018-10-20: 500 [IU]
  Filled 2018-10-20: qty 5

## 2018-10-20 MED ORDER — SODIUM CHLORIDE 0.9 % IV SOLN
Freq: Once | INTRAVENOUS | Status: AC
  Administered 2018-10-20: 09:00:00 via INTRAVENOUS
  Filled 2018-10-20: qty 250

## 2018-10-20 MED ORDER — SODIUM CHLORIDE 0.9 % IV SOLN
75.0000 mg/m2 | Freq: Once | INTRAVENOUS | Status: AC
  Administered 2018-10-20: 170 mg via INTRAVENOUS
  Filled 2018-10-20: qty 17

## 2018-10-20 MED ORDER — DEXAMETHASONE SODIUM PHOSPHATE 10 MG/ML IJ SOLN
10.0000 mg | Freq: Once | INTRAMUSCULAR | Status: AC
  Administered 2018-10-20: 10 mg via INTRAVENOUS

## 2018-10-20 MED ORDER — PEGFILGRASTIM 6 MG/0.6ML ~~LOC~~ PSKT
PREFILLED_SYRINGE | SUBCUTANEOUS | Status: AC
  Filled 2018-10-20: qty 0.6

## 2018-10-20 NOTE — Progress Notes (Signed)
Hematology and Oncology Follow Up Visit  Matthew Serrano [id number] 02/18/1946 73 y.o. 10/20/2018 8:41 AM [name], Matthew Serrano, Matthew Rumps, NP   Principle Diagnosis: 73 year old man with castration-sensitive prostate cancer presented with PSA of 1170 and a Gleason score 4+5 = 9 in October 2019.  He has lymphadenopathy and advanced disease at presentation.   prior Therapy:  Status post prostate biopsy obtained on 06/06/2018 which showed high-volume disease with a Gleason score 4+5 = 9.  Current therapy:   Androgen deprivation therapy in the form of Lupron transitioning from [place] under the care of Dr. Louis Serrano.  Taxotere chemotherapy at 75 mg/m every 3 weeks started on 07/07/2018.  He he is here for cycle 6 of therapy.  Interim History: Matthew Serrano is here for a follow-up visit.  Since the last visit, he continues to tolerate systemic chemotherapy without any complaints.  Denies any nausea, vomiting or infusion related complications.  He denies any worsening neuropathy or excessive fatigue.  He does report some nail changes which is problematic for him.  His quality of life remains unchanged.  He denies any urinary difficulties or bone pain.  Patient denied headaches, blurry vision, syncope or seizures.  Denies any fevers, chills or sweats.  Denied chest pain, palpitation, orthopnea or leg edema.  Denied cough, wheezing or hemoptysis.  Denied nausea, vomiting or abdominal pain.  Denies any constipation or diarrhea.  Denies any frequency urgency or hesitancy.  Denies any arthralgias or myalgias.  Denies any skin rashes or lesions.  Denies any bleeding or clotting tendency.  Denies any easy bruising.  Denies any hair or nail changes.  Denies any anxiety or depression.  Remaining review of system is negative.         Medications: I have reviewed the patient's current medications.  Current Outpatient Medications  Medication Sig Dispense Refill  . aspirin EC 81 MG tablet Take 81 mg by  mouth daily.    Marland Kitchen atorvastatin (LIPITOR) 40 MG tablet TAKE 1 TABLET (40 MG TOTAL) BY MOUTH DAILY. 90 tablet 3  . cholecalciferol (VITAMIN D3) 25 MCG (1000 UT) tablet Take 1,000 Units by mouth daily.    . cloNIDine (CATAPRES) 0.2 MG tablet TAKE 2 TABLETS BY MOUTH EVERY MORNING AND 2 TABLETS EVERY EVENING 360 tablet 3  . doxazosin (CARDURA) 8 MG tablet TAKE 1 TABLET (8 MG TOTAL) BY MOUTH AT BEDTIME. 90 tablet 3  . fosinopril (MONOPRIL) 40 MG tablet Take 2 tablets (80 mg total) by mouth daily. 200 tablet 3  . furosemide (LASIX) 40 MG tablet TAKE 2 TABLETS (80 MG TOTAL) BY MOUTH EVERY MORNING. 180 tablet 1  . glipiZIDE (GLUCOTROL XL) 10 MG 24 hr tablet TAKE 1 TABLET BY MOUTH TWICE A DAY 180 tablet 0  . labetalol (NORMODYNE) 200 MG tablet TAKE 3 TABLETS BY MOUTH EVERY MORNING AND 2 EVERY EVENING 450 tablet 3  . levothyroxine (SYNTHROID, LEVOTHROID) 150 MCG tablet TAKE 1 TABLET EVERY DAY 90 tablet 2  . lidocaine-prilocaine (EMLA) cream Apply 1 application topically as needed. 30 g 0  . metFORMIN (GLUCOPHAGE) 1000 MG tablet Take 1 tablet (1,000 mg total) by mouth 2 (two) times daily with a meal. 180 tablet 3  . NIFEdipine (PROCARDIA XL/ADALAT-CC) 60 MG 24 hr tablet TAKE 1 TABLET (60 MG TOTAL) BY MOUTH DAILY. 90 tablet 3  . potassium chloride SA (KLOR-CON M20) 20 MEQ tablet TAKE 1 TABLET BY MOUTH TWICE A DAY 180 tablet 3  . prochlorperazine (COMPAZINE) 10 MG tablet Take 1 tablet (  10 mg total) by mouth every 6 (six) hours as needed for nausea or vomiting. 30 tablet 0  . sildenafil (VIAGRA) 50 MG tablet Take 1 tablet (50 mg total) by mouth daily as needed for erectile dysfunction. 10 tablet 3   No current facility-administered medications for this visit.      Allergies: No Known Allergies  Past Medical History, Surgical history, Social history, and Family History were reviewed and updated.    Physical Exam:  Blood pressure 108/61, pulse 79, temperature 98.4 F (36.9 C), temperature source Oral,  resp. rate 18, height 5\' 7"  (1.702 m), weight 216 lb 14.4 oz (98.4 kg), SpO2 98 %.    ECOG: 0   General appearance: Comfortable appearing without any discomfort Head: Normocephalic without any trauma Oropharynx: Mucous membranes are moist and pink without any thrush or ulcers. Eyes: Pupils are equal and round reactive to light. Lymph nodes: No cervical, supraclavicular, inguinal or axillary lymphadenopathy.   Heart:regular rate and rhythm.  S1 and S2 without leg edema. Lung: Clear without any rhonchi or wheezes.  No dullness to percussion. Abdomin: Soft, nontender, nondistended with good bowel sounds.  No hepatosplenomegaly. Musculoskeletal: No joint deformity or effusion.  Full range of motion noted. Neurological: No deficits noted on motor, sensory and deep tendon reflex exam. Skin: No petechial rash or dryness.  Appeared moist.        Lab Results: Lab Results  Component Value Date   WBC 7.3 09/29/2018   HGB 9.4 (L) 09/29/2018   HCT 30.1 (L) 09/29/2018   MCV 87.5 09/29/2018   PLT 316 09/29/2018     Chemistry      Component Value Date/Time   NA 143 09/29/2018 0805   NA 143 05/16/2018 1126   K 4.0 09/29/2018 0805   CL 105 09/29/2018 0805   CO2 26 09/29/2018 0805   BUN 19 09/29/2018 0805   BUN 7 (L) 05/16/2018 1126   CREATININE 1.04 09/29/2018 0805      Component Value Date/Time   CALCIUM 8.9 09/29/2018 0805   ALKPHOS 81 09/29/2018 0805   AST 17 09/29/2018 0805   ALT 23 09/29/2018 0805   BILITOT 0.9 09/29/2018 0805       Results for Matthew Serrano (MRN [id number]) as of 10/20/2018 08:42  Ref. Range 09/08/2018 08:55 09/29/2018 08:05  Prostate Specific Ag, Serum Latest Ref Range: 0.0 - 4.0 ng/mL 40.6 (H) 47.5 (H)      Impression and Plan:  73 year old man with:  1.  Advanced prostate cancer with lymphadenopathy that is currently castration-sensitive diagnosed in October 2019.    He remains on Taxotere chemotherapy which she has tolerated reasonably  well and ready to proceed with cycle 6.  Risks and benefits of completing this therapy was discussed today.  Long-term complication associated with chemotherapy was reiterated including nausea, fatigue and myelosuppression.  Upon completing chemotherapy he will be observed closely and additional therapy may be needed if his PSA starts to rise again.  His last PSA did show slight increase but we will continue to monitor closely at this time.    2.  IV access: Port-A-Cath has been in use without any recent issues.  3.  Antiemetics: Currently on Compazine although rarely uses it.  4.  Androgen deprivation: He has received androgen deprivation therapy under the care of Dr. Louis Serrano but wishes to resume it here.  We will schedule Lupron injection with the next visit.  Risks and benefits of continuing Lupron long-term was discussed.  He  is to include hot flashes, weight gain among others.  5.  Goals of care and prognosis: Therapy remains palliative although aggressive therapy is warranted given his excellent performance status.  6.  Growth factor support: He continues to receive Onpro growth factor support after each cycle.  He is at risk of neutropenic sepsis.  7.  Follow-up: We will be in 4 to 6 weeks to follow his progress.  25  minutes was spent with the patient face-to-face today.  More than 50% of time was spent on reviewing laboratory data, disease status update, dealing with complications related to chemotherapy and coordinating plan of care.      [name], MD 2/28/20208:41 AM

## 2018-10-20 NOTE — Patient Instructions (Signed)
[facility] Discharge Instructions for Patients Receiving Chemotherapy  Today you received the following chemotherapy agents Taxotere To help prevent nausea and vomiting after your treatment, we encourage you to take your nausea medication as prescribed.   If you develop nausea and vomiting that is not controlled by your nausea medication, call the clinic.   BELOW ARE SYMPTOMS THAT SHOULD BE REPORTED IMMEDIATELY:  *FEVER GREATER THAN 100.5 F  *CHILLS WITH OR WITHOUT FEVER  NAUSEA AND VOMITING THAT IS NOT CONTROLLED WITH YOUR NAUSEA MEDICATION  *UNUSUAL SHORTNESS OF BREATH  *UNUSUAL BRUISING OR BLEEDING  TENDERNESS IN MOUTH AND THROAT WITH OR WITHOUT PRESENCE OF ULCERS  *URINARY PROBLEMS  *BOWEL PROBLEMS  UNUSUAL RASH Items with * indicate a potential emergency and should be followed up as soon as possible.  Feel free to call the clinic should you have any questions or concerns. The clinic phone number is [phone].  Please show the CHEMO ALERT CARD at check-in to the Emergency Department and triage nurse.   

## 2018-10-20 NOTE — Telephone Encounter (Signed)
Scheduled appt per 02/28 los. ° °Printed calendar and avs. °

## 2018-10-21 LAB — PROSTATE-SPECIFIC AG, SERUM (LABCORP): Prostate Specific Ag, Serum: 48.3 ng/mL — ABNORMAL HIGH (ref 0.0–4.0)

## 2018-11-02 ENCOUNTER — Other Ambulatory Visit: Payer: Self-pay | Admitting: Adult Health

## 2018-11-02 DIAGNOSIS — E119 Type 2 diabetes mellitus without complications: Secondary | ICD-10-CM

## 2018-11-03 NOTE — Telephone Encounter (Signed)
Sent to the pharmacy by e-scribe. 

## 2018-11-03 NOTE — Telephone Encounter (Signed)
Ok for 90 days  

## 2018-11-13 ENCOUNTER — Telehealth: Payer: Self-pay | Admitting: Cardiology

## 2018-11-13 NOTE — Telephone Encounter (Signed)
   Reason for call: Reschedule echocardiogram-monitor moderate aortic stenosis  Ordering provider: Janit Bern  1 year echocardiogram follow-up.  Spoke with patient, he is currently doing well without any escalating cardiac symptoms no angina no syncope no shortness of breath.  He is about to undergo chemotherapy for prostate cancer.  He has lymphadenopathy and advanced disease.  He just had a Port-A-Cath placed.  In light of the coronavirus, we will go ahead and postpone his echocardiogram for 3 months or greater.  Level 3.  I think this is reasonable given the stability of his symptoms.  Also we do not want expose him in a potential immunocompromise state.  Candee Furbish, MD

## 2018-11-14 ENCOUNTER — Ambulatory Visit (HOSPITAL_COMMUNITY): Payer: Medicare Other

## 2018-11-14 NOTE — Telephone Encounter (Signed)
thanks

## 2018-11-28 ENCOUNTER — Other Ambulatory Visit: Payer: Self-pay

## 2018-11-28 ENCOUNTER — Inpatient Hospital Stay: Payer: Medicare Other | Attending: Oncology

## 2018-11-28 ENCOUNTER — Inpatient Hospital Stay: Payer: Medicare Other

## 2018-11-28 DIAGNOSIS — Z9221 Personal history of antineoplastic chemotherapy: Secondary | ICD-10-CM | POA: Diagnosis not present

## 2018-11-28 DIAGNOSIS — Z79818 Long term (current) use of other agents affecting estrogen receptors and estrogen levels: Secondary | ICD-10-CM | POA: Diagnosis not present

## 2018-11-28 DIAGNOSIS — C61 Malignant neoplasm of prostate: Secondary | ICD-10-CM | POA: Insufficient documentation

## 2018-11-28 LAB — CBC WITH DIFFERENTIAL (CANCER CENTER ONLY)
Abs Immature Granulocytes: 0.01 10*3/uL (ref 0.00–0.07)
Basophils Absolute: 0.1 10*3/uL (ref 0.0–0.1)
Basophils Relative: 1 %
Eosinophils Absolute: 0.8 10*3/uL — ABNORMAL HIGH (ref 0.0–0.5)
Eosinophils Relative: 13 %
HCT: 32.4 % — ABNORMAL LOW (ref 39.0–52.0)
Hemoglobin: 9.9 g/dL — ABNORMAL LOW (ref 13.0–17.0)
Immature Granulocytes: 0 %
Lymphocytes Relative: 25 %
Lymphs Abs: 1.5 10*3/uL (ref 0.7–4.0)
MCH: 27.3 pg (ref 26.0–34.0)
MCHC: 30.6 g/dL (ref 30.0–36.0)
MCV: 89.5 fL (ref 80.0–100.0)
Monocytes Absolute: 0.5 10*3/uL (ref 0.1–1.0)
Monocytes Relative: 8 %
Neutro Abs: 3.2 10*3/uL (ref 1.7–7.7)
Neutrophils Relative %: 53 %
Platelet Count: 237 10*3/uL (ref 150–400)
RBC: 3.62 MIL/uL — ABNORMAL LOW (ref 4.22–5.81)
RDW: 15.4 % (ref 11.5–15.5)
WBC Count: 6.1 10*3/uL (ref 4.0–10.5)
nRBC: 0 % (ref 0.0–0.2)

## 2018-11-28 LAB — CMP (CANCER CENTER ONLY)
ALT: 16 U/L (ref 0–44)
AST: 14 U/L — ABNORMAL LOW (ref 15–41)
Albumin: 3.3 g/dL — ABNORMAL LOW (ref 3.5–5.0)
Alkaline Phosphatase: 78 U/L (ref 38–126)
Anion gap: 9 (ref 5–15)
BUN: 12 mg/dL (ref 8–23)
CO2: 25 mmol/L (ref 22–32)
Calcium: 8.8 mg/dL — ABNORMAL LOW (ref 8.9–10.3)
Chloride: 108 mmol/L (ref 98–111)
Creatinine: 0.98 mg/dL (ref 0.61–1.24)
GFR, Est AFR Am: >60 mL/min (ref >60)
GFR, Estimated: >60 mL/min (ref >60)
Glucose, Bld: 154 mg/dL — ABNORMAL HIGH (ref 70–99)
Potassium: 4.5 mmol/L (ref 3.5–5.1)
Sodium: 142 mmol/L (ref 135–145)
Total Bilirubin: 0.6 mg/dL (ref 0.3–1.2)
Total Protein: 6.1 g/dL — ABNORMAL LOW (ref 6.5–8.1)

## 2018-11-29 ENCOUNTER — Telehealth: Payer: Self-pay | Admitting: Pharmacist

## 2018-11-29 ENCOUNTER — Inpatient Hospital Stay (HOSPITAL_BASED_OUTPATIENT_CLINIC_OR_DEPARTMENT_OTHER): Payer: Medicare Other | Admitting: Oncology

## 2018-11-29 ENCOUNTER — Inpatient Hospital Stay: Payer: Medicare Other

## 2018-11-29 ENCOUNTER — Telehealth: Payer: Self-pay

## 2018-11-29 ENCOUNTER — Other Ambulatory Visit: Payer: Self-pay

## 2018-11-29 ENCOUNTER — Telehealth: Payer: Self-pay | Admitting: Oncology

## 2018-11-29 VITALS — BP 110/60 | HR 67 | Temp 98.0°F | Resp 17 | Ht 67.0 in | Wt 216.5 lb

## 2018-11-29 DIAGNOSIS — Z9221 Personal history of antineoplastic chemotherapy: Secondary | ICD-10-CM | POA: Diagnosis not present

## 2018-11-29 DIAGNOSIS — C61 Malignant neoplasm of prostate: Secondary | ICD-10-CM | POA: Diagnosis not present

## 2018-11-29 DIAGNOSIS — Z79818 Long term (current) use of other agents affecting estrogen receptors and estrogen levels: Secondary | ICD-10-CM

## 2018-11-29 LAB — PROSTATE-SPECIFIC AG, SERUM (LABCORP): Prostate Specific Ag, Serum: 82.5 ng/mL — ABNORMAL HIGH (ref 0.0–4.0)

## 2018-11-29 MED ORDER — LEUPROLIDE ACETATE (4 MONTH) 30 MG IM KIT
30.0000 mg | PACK | Freq: Once | INTRAMUSCULAR | Status: AC
  Administered 2018-11-29: 30 mg via INTRAMUSCULAR
  Filled 2018-11-29: qty 30

## 2018-11-29 MED ORDER — ENZALUTAMIDE 40 MG PO CAPS: 160.0000 mg | ORAL_CAPSULE | Freq: Every day | ORAL | 0 refills | Status: DC

## 2018-11-29 MED ORDER — PREDNISONE 5 MG PO TABS: 5.0000 mg | ORAL_TABLET | Freq: Every day | ORAL | 3 refills | Status: DC

## 2018-11-29 MED ORDER — ABIRATERONE ACETATE 250 MG PO TABS: 1000.0000 mg | ORAL_TABLET | Freq: Every day | ORAL | 0 refills | Status: DC

## 2018-11-29 NOTE — Telephone Encounter (Signed)
Oral Oncology Pharmacist Encounter  Received new prescription for Zytiga (abiraterone) for the treatment of metastatic, castration-resistant prostate cancer in conjunction with prednisone androgen deprivation therapy, planned duration until disease progression or unacceptable toxicity.  Original diagnosis in Oct 2019 with high-volume disease and Gleason score 4+5=9 Patient received docetaxel chemotherapy x 6 cycles Nov 2019 - April 2020  Patient is rapidly developing castration-resistant disease per MD and is under evaluation to iniatate treatment with Zytiga at 1000mg  once daily with prednisone at 5mg  daily.  Labs from 11/28/2018 assessed, OK for treatment initiation. BPs reviewed, most readings WNL or below, will continue to be monitored.  Current medication list in Epic reviewed, no DDIs with Zytiga identified.  Prescription has been e-scribed to the [facility] for benefits analysis and approval.  Oral Oncology Clinic will continue to follow for insurance authorization, copayment issues, initial counseling and start date.  Johny Drilling, PharmD, BCPS, BCOP  11/29/2018 11:49 AM  Oral Oncology Clinic [phone]

## 2018-11-29 NOTE — Telephone Encounter (Signed)
Oral Oncology Pharmacist Encounter  Discussed nifedipine and Xtandi interaction with MD. Gillermina Phy referral will be canceled. Patient will be prescribed Zytiga plus prednisone. This will be documented in a separate encounter.  Johny Drilling, PharmD, BCPS, BCOP  11/29/2018 11:46 AM Oral Oncology Clinic [phone]

## 2018-11-29 NOTE — Patient Instructions (Signed)

## 2018-11-29 NOTE — Telephone Encounter (Signed)
Oral Oncology Pharmacist Encounter  Received new prescription for Xtandi (enzalutamide) for the treatment of metastatic, castration-resistant prostate cancer in conjunction with androgen deprivation therapy, planned duration until disease progression or unacceptable toxicity.  Original diagnosis in Oct 2019 with high-volume disease and Gleason score 4+5=9 Patient received docetaxel chemotherapy x 6 cycles Nov 2019 - April 2020  Patient is rapidly developing castration-resistant disease per MD and is under evaluation to iniatate treatment with Xtandi at 160mg  once daily  Labs from 11/28/2018 assessed, OK for treatment initiation. BPs reviewed, most readings WNL or below, will continue to be monitored.  Current medication list in Epic reviewed, DDIs with Xtandi identified:  Xtandi and nifedipine: category X interaction: Gillermina Phy is a strong inducer of CYP3A4 leading to significant increase in clearance and reduction in exposure to nifedipine. Will discuss with MD.  Gillermina Phy and Lipitor and Cardura: category D interactions: Gillermina Phy is a strong inducer of CYP3A4 leading to probable increase in clearance and reduction in exposure to Lipitor and Cardura. Patient can be monitored for decreased efficacy in these medications and dose adjusted as necessary.  Xtandi and glipizide: category D interaction:  Gillermina Phy is an inducer of CYP2C9 leading to probable increase in clearance and reduction in exposure to glipizide. Patient can be monitored for decreased efficacy in glipizide and dose adjusted as necessary.  Prescription has been e-scribed to the [facility] for benefits analysis and approval by MD.  [facility] will continue to follow for insurance authorization, copayment issues, initial counseling and start date once address nifedipine interaction.  Johny Drilling, PharmD, BCPS, BCOP  11/29/2018 10:36 AM Oral Oncology Clinic [phone]

## 2018-11-29 NOTE — Progress Notes (Signed)
Hematology and Oncology Follow Up Visit  Matthew Serrano [id number] 10-26-1945 73 y.o. 11/29/2018 9:01 AM [name], [name], [name], NP   Principle Diagnosis: 73 year old man with advanced prostate cancer diagnosed in October 2019.  He presented with castration-sensitive disease, PSA of 1170 and a Gleason score 4+5 = 9 with lymphadenopathy.   Prior Therapy:  Status post prostate biopsy obtained on 06/06/2018 which showed high-volume disease with a Gleason score 4+5 = 9.  Taxotere chemotherapy at 75 mg/m every 3 weeks started on 07/07/2018.  He he is here for cycle 6 of therapy.  Current therapy:   Androgen deprivation therapy in the form of Lupron transitioning from [place] under the care of Dr. Louis Meckel.    Interim History: Mr. Matthew Serrano is here for a return evaluation.  Since the last visit, he reports no major changes in his health.  He has recovered from the effect of chemotherapy without any major concerns.  He denies any residual nausea, fatigue or peripheral neuropathy.  Continues to attend activities of daily living without any bone pain or pathological fractures.  Performance status and quality of life remains unchanged.   He denied any alteration mental status, neuropathy, confusion or dizziness.  Denies any headaches or lethargy.  Denies any night sweats, weight loss or changes in appetite.  Denied orthopnea, dyspnea on exertion or chest discomfort.  Denies shortness of breath, difficulty breathing hemoptysis or cough.  Denies any abdominal distention, nausea, early satiety or dyspepsia.  Denies any hematuria, frequency, dysuria or nocturia.  Denies any skin irritation, dryness or rash.  Denies any ecchymosis or petechiae.  Denies any lymphadenopathy or clotting.  Denies any heat or cold intolerance.  Denies any anxiety or depression.  Remaining review of system is negative.          Medications: I have reviewed the patient's current medications.  Current Outpatient  Medications  Medication Sig Dispense Refill  . aspirin EC 81 MG tablet Take 81 mg by mouth daily.    Marland Kitchen atorvastatin (LIPITOR) 40 MG tablet TAKE 1 TABLET (40 MG TOTAL) BY MOUTH DAILY. 90 tablet 3  . cholecalciferol (VITAMIN D3) 25 MCG (1000 UT) tablet Take 1,000 Units by mouth daily.    . cloNIDine (CATAPRES) 0.2 MG tablet TAKE 2 TABLETS BY MOUTH EVERY MORNING AND 2 TABLETS EVERY EVENING 360 tablet 3  . doxazosin (CARDURA) 8 MG tablet TAKE 1 TABLET (8 MG TOTAL) BY MOUTH AT BEDTIME. 90 tablet 3  . fosinopril (MONOPRIL) 40 MG tablet Take 2 tablets (80 mg total) by mouth daily. 200 tablet 3  . furosemide (LASIX) 40 MG tablet TAKE 2 TABLETS (80 MG TOTAL) BY MOUTH EVERY MORNING. 180 tablet 1  . glipiZIDE (GLUCOTROL XL) 10 MG 24 hr tablet TAKE 1 TABLET TWICE DAILY 180 tablet 0  . labetalol (NORMODYNE) 200 MG tablet TAKE 3 TABLETS BY MOUTH EVERY MORNING AND 2 EVERY EVENING 450 tablet 3  . levothyroxine (SYNTHROID, LEVOTHROID) 150 MCG tablet TAKE 1 TABLET EVERY DAY 90 tablet 2  . lidocaine-prilocaine (EMLA) cream Apply 1 application topically as needed. 30 g 0  . metFORMIN (GLUCOPHAGE) 1000 MG tablet Take 1 tablet (1,000 mg total) by mouth 2 (two) times daily with a meal. 180 tablet 3  . NIFEdipine (PROCARDIA XL/ADALAT-CC) 60 MG 24 hr tablet TAKE 1 TABLET (60 MG TOTAL) BY MOUTH DAILY. 90 tablet 3  . potassium chloride SA (KLOR-CON M20) 20 MEQ tablet TAKE 1 TABLET BY MOUTH TWICE A DAY 180 tablet 3  .  prochlorperazine (COMPAZINE) 10 MG tablet Take 1 tablet (10 mg total) by mouth every 6 (six) hours as needed for nausea or vomiting. (Patient not taking: Reported on 10/20/2018) 30 tablet 0  . sildenafil (VIAGRA) 50 MG tablet Take 1 tablet (50 mg total) by mouth daily as needed for erectile dysfunction. 10 tablet 3   No current facility-administered medications for this visit.      Allergies: No Known Allergies  Past Medical History, Surgical history, Social history, and Family History were reviewed and  updated.    Physical Exam:  Blood pressure 110/60, pulse 67, temperature 98 F (36.7 C), resp. rate 17, height 5\' 7"  (1.702 m), weight 216 lb 8 oz (98.2 kg), SpO2 99 %.     ECOG: 0     General appearance: Alert, awake without any distress. Head: Atraumatic without abnormalities Oropharynx: Without any thrush or ulcers. Eyes: No scleral icterus. Lymph nodes: No lymphadenopathy noted in the cervical, supraclavicular, or axillary nodes Heart:regular rate and rhythm, without any murmurs or gallops.   Lung: Clear to auscultation without any rhonchi, wheezes or dullness to percussion. Abdomin: Soft, nontender without any shifting dullness or ascites. Musculoskeletal: No clubbing or cyanosis. Neurological: No motor or sensory deficits. Skin: No rashes or lesions. .        Lab Results: Lab Results  Component Value Date   WBC 6.1 11/28/2018   HGB 9.9 (L) 11/28/2018   HCT 32.4 (L) 11/28/2018   MCV 89.5 11/28/2018   PLT 237 11/28/2018     Chemistry      Component Value Date/Time   NA 142 11/28/2018 0922   NA 143 05/16/2018 1126   K 4.5 11/28/2018 0922   CL 108 11/28/2018 0922   CO2 25 11/28/2018 0922   BUN 12 11/28/2018 0922   BUN 7 (L) 05/16/2018 1126   CREATININE 0.98 11/28/2018 0922      Component Value Date/Time   CALCIUM 8.8 (L) 11/28/2018 0922   ALKPHOS 78 11/28/2018 0922   AST 14 (L) 11/28/2018 0922   ALT 16 11/28/2018 0922   BILITOT 0.6 11/28/2018 0922       Results for CESARE, SUMLIN (MRN [id number]) as of 11/29/2018 08:03  Ref. Range 10/20/2018 08:00 11/28/2018 09:22  Prostate Specific Ag, Serum Latest Ref Range: 0.0 - 4.0 ng/mL 48.3 (H) 82.5 (H)       Impression and Plan:  73 year old man with:  1.  Castration-sensitive prostate cancer with lymphadenopathy diagnosed in October 2019.  Marland Kitchen    He completed 6 cycles of chemotherapy without any major complaints and PSA nadir down to 40.6 from 1170.  His PSA on 11/28/2018 was personally reviewed  today and discussed with the patient.  He is developing castration-resistant disease rather rapidly which is not surprising given the aggressive nature of his disease.  Salvage therapies at this time were discussed which include starting Nicki Reaper, Trudi Ida or additional chemotherapy.   After discussion today, we will proceed with Xtandi and will repeat imaging studies in the next 2 months.    2.  IV access: Port-A-Cath will continue be flushed periodically.  3.  Antiemetics: No nausea or vomiting reported at this time.  4.  Androgen deprivation: Long-term complications related to androgen deprivation was reviewed today.  These would include weight gain, hyperlipidemia and osteoporosis.  He will receive Lupron today and repeated in 4 months.  5.  Goals of care and prognosis: His disease is incurable and treatment remains palliative at this time.  His  performance status is excellent and aggressive therapy is warranted.   6.  Follow-up: We will be in 6 weeks for repeat evaluation.  25  minutes was spent with the patient face-to-face today.  More than 50% of time was dedicated to reviewing his disease status, treatment options and complications related to therapy.     [name], MD 4/8/20209:01 AM

## 2018-11-29 NOTE — Telephone Encounter (Signed)
Oral Oncology Patient Advocate Encounter  Received notification from [facility] that prior authorization for Matthew Serrano is required.  PA submitted on CoverMyMeds Key [id number] Status is pending  Oral Oncology Clinic will continue to follow.  [place] Patient [place] Phone [phone] Fax [phone] 11/29/2018    2:15 PM

## 2018-11-29 NOTE — Telephone Encounter (Signed)
Called and scheduled appt per 4/8 los.  Left a voice message of scheduled appts.

## 2018-11-29 NOTE — Telephone Encounter (Signed)
Oral Oncology Patient Advocate Encounter  Prior Authorization for Fabio Asa has been approved.    PA# [id number] Effective dates: 11/29/18 through 05/28/19  Oral Oncology Clinic will continue to follow.   Matthew Serrano Phone [phone] Fax [phone] 11/29/2018    2:15 PM

## 2018-11-30 ENCOUNTER — Telehealth: Payer: Self-pay

## 2018-11-30 NOTE — Telephone Encounter (Signed)
Oral Oncology Patient Advocate Encounter   Was successful in securing patient an $7,300 grant from Patient [facility]) to provide copayment coverage for Zytiga.  This will keep the out of pocket expense at $0.     I have spoken with the patient.    The billing information is as follows and has been shared with [place].   Member ID: [id number] Group ID: [id number] RxBin: [id number] Dates of Eligibility: 09/01/18 through 11/29/19  [place] Patient [place] Phone [phone] Fax [phone] 11/30/2018    11:14 AM

## 2018-11-30 NOTE — Telephone Encounter (Signed)
Oral Chemotherapy Pharmacist Encounter   I spoke with patient for overview of: Zytiga (abiraterone)for the treatment of metastatic, castration-resistant prostate cancerin conjunction with prednisone androgen deprivation therapy, planned durationuntil disease progression or unacceptable toxicity.   Counseled patient on administration, dosing, side effects, monitoring, drug-food interactions, safe handling, storage, and disposal.  Patient will take Zytiga 250mg  tablets, 4 tablets (1000mg ) by mouth once daily on an empty stomach, 1 hour before or 2 hours after a meal.  Patient will take prednisone 5mg  tablet, 1 tablet by mouth one daily with breakfast.  Zytiga start date: 12/04/2018  Adverse effects include but are not limited to: peripheral edema, GI upset, hypertension, hot flashes, fatigue, and arthralgias.    Reviewed with patient importance of keeping a medication schedule and plan for any missed doses.  Mr. Hobbins voiced understanding and appreciation.   All questions answered. Medication reconciliation performed and medication/allergy list updated.  Insurance authorization for Fabio Asa has been obtained. Test claim at the pharmacy revealed copayment $932.78 for 1st fill. Oral oncology patient advocate was successful in obtaining copayment foundation grant to cover out-of-pocket costs for Zytiga. Zytiga and prednisone will ship from the [place] on 12/01/2018 to deliver to patient's home on 12/02/2018 or 12/04/2018.  Patient informed the pharmacy will reach out 5-7 days prior to needing next fill of Zytiga to coordinate continued medication acquisition to prevent break in therapy.  Patient knows to call the office with questions or concerns.  Oral Oncology Clinic will continue to follow.  Johny Drilling, PharmD, BCPS, BCOP  11/30/2018 10:46 AM Oral Oncology Clinic [phone]

## 2018-12-01 MED FILL — predniSONE 5 MG TABS: 5 | 90 days supply | Qty: 90 | Fill #0

## 2018-12-01 MED FILL — ABIRATERONE ACETATE 250 MG: 250 | 30 days supply | Qty: 120 | Fill #0

## 2018-12-01 NOTE — Telephone Encounter (Signed)
Oral Oncology Patient Advocate Encounter  Confirmed with Natalia that [facility] and prednisone was shipped on 12/01/18 with a $0 copay using [employer] grant.  [place] Patient [place] Phone [phone] Fax [phone] 12/01/2018   3:17 PM

## 2018-12-21 ENCOUNTER — Telehealth: Payer: Self-pay | Admitting: Adult Health

## 2018-12-21 NOTE — Telephone Encounter (Signed)
Copied from [place] [phone]. Topic: Quick Communication - Rx Refill/Question >> Dec 21, 2018  1:15 PM Selinda Flavin B, NT wrote: Medication: metFORMIN (GLUCOPHAGE) 1000 MG tablet  Has the patient contacted their pharmacy? yes (Agent: If no, request that the patient contact the pharmacy for the refill.) (Agent: If yes, when and what did the pharmacy advise?)  Preferred Pharmacy (with phone number or street name): [place], [address]  Agent: Please be advised that RX refills may take up to 3 business days. We ask that you follow-up with your pharmacy.

## 2018-12-22 ENCOUNTER — Other Ambulatory Visit: Payer: Self-pay | Admitting: Family Medicine

## 2018-12-22 ENCOUNTER — Other Ambulatory Visit (INDEPENDENT_AMBULATORY_CARE_PROVIDER_SITE_OTHER): Payer: Medicare Other

## 2018-12-22 ENCOUNTER — Other Ambulatory Visit: Payer: Self-pay

## 2018-12-22 DIAGNOSIS — E119 Type 2 diabetes mellitus without complications: Secondary | ICD-10-CM

## 2018-12-22 LAB — HEMOGLOBIN A1C: Hgb A1c MFr Bld: 7.5 % — ABNORMAL HIGH (ref 4.6–6.5)

## 2018-12-22 MED ORDER — METFORMIN HCL 1000 MG PO TABS: 1000.0000 mg | ORAL_TABLET | Freq: Two times a day (BID) | ORAL | 0 refills | Status: DC

## 2018-12-22 NOTE — Telephone Encounter (Signed)
Sent to the pharmacy by e-scribe.  Pt now scheduled for A1C and virtual follow up with [facility].  Nothing further needed.

## 2018-12-25 ENCOUNTER — Other Ambulatory Visit: Payer: Self-pay | Admitting: Oncology

## 2018-12-25 DIAGNOSIS — C61 Malignant neoplasm of prostate: Secondary | ICD-10-CM

## 2018-12-26 ENCOUNTER — Other Ambulatory Visit: Payer: Self-pay

## 2018-12-26 ENCOUNTER — Encounter: Payer: Self-pay | Admitting: Adult Health

## 2018-12-26 ENCOUNTER — Ambulatory Visit (INDEPENDENT_AMBULATORY_CARE_PROVIDER_SITE_OTHER): Payer: Medicare Other | Admitting: Adult Health

## 2018-12-26 DIAGNOSIS — E119 Type 2 diabetes mellitus without complications: Secondary | ICD-10-CM

## 2018-12-26 NOTE — Progress Notes (Signed)
Virtual Visit via Video Note  I connected with Matthew Serrano on 12/26/18 at  8:00 AM EDT by a video enabled telemedicine application and verified that I am speaking with the correct person using two identifiers.  Location patient: home Location provider:work or home office Persons participating in the virtual visit: patient, provider  I discussed the limitations of evaluation and management by telemedicine and the availability of in person appointments. The patient expressed understanding and agreed to proceed.   HPI: 73 year old male who is being evaluated today for follow-up regarding diabetes.  Is currently prescribed metformin 1000 mg twice daily and glipizide 10 mg extended release daily.  He has been monitoring his blood sugars at home and reports readings from 1 25-2 50 with an average of about 150.  Denies any symptoms of hypo-or hyperglycemia.  He does report that he is eating healthier and has cut back on carbs as well as he is quit drinking alcohol.  He is not exercising.  Last A1c was 6.4, 7 months ago    Since we have last seen each other he has pleated chemotherapy for prostate cancer, he had a Gleason score of 9.  He was recently started on Zytiga by oncology.  Is also on prednisone 5 mg daily.   ROS: See pertinent positives and negatives per HPI.  Past Medical History:  Diagnosis Date  . Aortic stenosis    Echo 8/18: mild LVH, EF 60-65, no RWMA, mild AS (mean 10, peak 19), MAC, trivial MR, normal RVSF   . DIABETES MELLITUS, TYPE II 07/06/2007  . ED (erectile dysfunction)   . Elevated PSA 01/20/2012  . Elevated PSA   . Heart murmur   . History of nuclear stress test    Myoview 8/18: EF 64, inferobasal thinning, no ischemia, low risk  . HYPERLIPIDEMIA 07/06/2007  . HYPERTENSION 07/06/2007  . HYPOTHYROIDISM 07/06/2007  . Leg pain    ABIs 8/18:  Normal   . Obesity     Past Surgical History:  Procedure Laterality Date  . COLONOSCOPY    . COLONOSCOPY W/ BIOPSIES AND  POLYPECTOMY    . IR IMAGING GUIDED PORT INSERTION  07/05/2018  . POLYPECTOMY      Family History  Problem Relation Age of Onset  . Hypertension Other   . Breast cancer Sister   . Breast cancer Unknown        family history  . Breast cancer Brother        prostate ca  . Aneurysm Mother        Brain aneurysm   . Prostate cancer Father   . Colon cancer Neg Hx       Current Outpatient Medications:  .  abiraterone acetate (ZYTIGA) 250 MG tablet, TAKE 4 TABLETS (1,000 MG TOTAL) BY MOUTH DAILY. TAKE ON AN EMPTY STOMACH 1 HOUR BEFORE OR 2 HOURS AFTER A MEAL, Disp: 120 tablet, Rfl: 0 .  aspirin EC 81 MG tablet, Take 81 mg by mouth daily., Disp: , Rfl:  .  atorvastatin (LIPITOR) 40 MG tablet, TAKE 1 TABLET (40 MG TOTAL) BY MOUTH DAILY., Disp: 90 tablet, Rfl: 3 .  cholecalciferol (VITAMIN D3) 25 MCG (1000 UT) tablet, Take 1,000 Units by mouth daily., Disp: , Rfl:  .  cloNIDine (CATAPRES) 0.2 MG tablet, TAKE 2 TABLETS BY MOUTH EVERY MORNING AND 2 TABLETS EVERY EVENING, Disp: 360 tablet, Rfl: 3 .  doxazosin (CARDURA) 8 MG tablet, TAKE 1 TABLET (8 MG TOTAL) BY MOUTH AT BEDTIME., Disp: 90 tablet,  Rfl: 3 .  fosinopril (MONOPRIL) 40 MG tablet, Take 2 tablets (80 mg total) by mouth daily., Disp: 200 tablet, Rfl: 3 .  furosemide (LASIX) 40 MG tablet, TAKE 2 TABLETS (80 MG TOTAL) BY MOUTH EVERY MORNING., Disp: 180 tablet, Rfl: 1 .  glipiZIDE (GLUCOTROL XL) 10 MG 24 hr tablet, TAKE 1 TABLET TWICE DAILY, Disp: 180 tablet, Rfl: 0 .  labetalol (NORMODYNE) 200 MG tablet, TAKE 3 TABLETS BY MOUTH EVERY MORNING AND 2 EVERY EVENING, Disp: 450 tablet, Rfl: 3 .  levothyroxine (SYNTHROID, LEVOTHROID) 150 MCG tablet, TAKE 1 TABLET EVERY DAY, Disp: 90 tablet, Rfl: 2 .  lidocaine-prilocaine (EMLA) cream, Apply 1 application topically as needed., Disp: 30 g, Rfl: 0 .  metFORMIN (GLUCOPHAGE) 1000 MG tablet, Take 1 tablet (1,000 mg total) by mouth 2 (two) times daily with a meal., Disp: 180 tablet, Rfl: 0 .  NIFEdipine  (PROCARDIA XL/ADALAT-CC) 60 MG 24 hr tablet, TAKE 1 TABLET (60 MG TOTAL) BY MOUTH DAILY., Disp: 90 tablet, Rfl: 3 .  potassium chloride SA (KLOR-CON M20) 20 MEQ tablet, TAKE 1 TABLET BY MOUTH TWICE A DAY, Disp: 180 tablet, Rfl: 3 .  predniSONE (DELTASONE) 5 MG tablet, Take 1 tablet (5 mg total) by mouth daily with breakfast., Disp: 90 tablet, Rfl: 3 .  prochlorperazine (COMPAZINE) 10 MG tablet, Take 1 tablet (10 mg total) by mouth every 6 (six) hours as needed for nausea or vomiting. (Patient not taking: Reported on 10/20/2018), Disp: 30 tablet, Rfl: 0 .  sildenafil (VIAGRA) 50 MG tablet, Take 1 tablet (50 mg total) by mouth daily as needed for erectile dysfunction., Disp: 10 tablet, Rfl: 3  EXAM:  VITALS per patient if applicable:  GENERAL: alert, oriented, appears well and in no acute distress  HEENT: atraumatic, conjunttiva clear, no obvious abnormalities on inspection of external nose and ears  NECK: normal movements of the head and neck  LUNGS: on inspection no signs of respiratory distress, breathing rate appears normal, no obvious gross SOB, gasping or wheezing  CV: no obvious cyanosis  MS: moves all visible extremities without noticeable abnormality  PSYCH/NEURO: pleasant and cooperative, no obvious depression or anxiety, speech and thought processing grossly intact  ASSESSMENT AND PLAN: 1. Type 2 diabetes mellitus without complication, without long-term current use of insulin (HCC) -A1c is up approximately one-point to 7.5.  Likely prednisone is pushing his blood sugars up slightly.  He was advised to start walking for 20 to 30 minutes every day drink more water throughout the day.  No changes in medication at this time, we will recheck in 3 months.  I discussed the assessment and treatment plan with the patient. The patient was provided an opportunity to ask questions and all were answered. The patient agreed with the plan and demonstrated an understanding of the instructions.    The patient was advised to call back or seek an in-person evaluation if the symptoms worsen or if the condition fails to improve as anticipated.   [name], NP

## 2018-12-27 MED FILL — ABIRATERONE ACETATE 250 MG: 250 | 30 days supply | Qty: 120 | Fill #0

## 2019-01-09 ENCOUNTER — Other Ambulatory Visit: Payer: Self-pay

## 2019-01-09 ENCOUNTER — Inpatient Hospital Stay: Payer: Medicare Other | Attending: Oncology

## 2019-01-09 ENCOUNTER — Inpatient Hospital Stay: Payer: Medicare Other

## 2019-01-09 DIAGNOSIS — I10 Essential (primary) hypertension: Secondary | ICD-10-CM | POA: Diagnosis not present

## 2019-01-09 DIAGNOSIS — R591 Generalized enlarged lymph nodes: Secondary | ICD-10-CM | POA: Diagnosis not present

## 2019-01-09 DIAGNOSIS — R569 Unspecified convulsions: Secondary | ICD-10-CM | POA: Insufficient documentation

## 2019-01-09 DIAGNOSIS — Z79899 Other long term (current) drug therapy: Secondary | ICD-10-CM | POA: Insufficient documentation

## 2019-01-09 DIAGNOSIS — C61 Malignant neoplasm of prostate: Secondary | ICD-10-CM

## 2019-01-09 LAB — CBC WITH DIFFERENTIAL (CANCER CENTER ONLY)
Abs Immature Granulocytes: 0.02 10*3/uL (ref 0.00–0.07)
Basophils Absolute: 0 10*3/uL (ref 0.0–0.1)
Basophils Relative: 0 %
Eosinophils Absolute: 0.1 10*3/uL (ref 0.0–0.5)
Eosinophils Relative: 2 %
HCT: 33.5 % — ABNORMAL LOW (ref 39.0–52.0)
Hemoglobin: 10.6 g/dL — ABNORMAL LOW (ref 13.0–17.0)
Immature Granulocytes: 0 %
Lymphocytes Relative: 22 %
Lymphs Abs: 1.5 10*3/uL (ref 0.7–4.0)
MCH: 26.6 pg (ref 26.0–34.0)
MCHC: 31.6 g/dL (ref 30.0–36.0)
MCV: 84 fL (ref 80.0–100.0)
Monocytes Absolute: 0.5 10*3/uL (ref 0.1–1.0)
Monocytes Relative: 7 %
Neutro Abs: 4.7 10*3/uL (ref 1.7–7.7)
Neutrophils Relative %: 69 %
Platelet Count: 202 10*3/uL (ref 150–400)
RBC: 3.99 MIL/uL — ABNORMAL LOW (ref 4.22–5.81)
RDW: 13.9 % (ref 11.5–15.5)
WBC Count: 6.9 10*3/uL (ref 4.0–10.5)
nRBC: 0 % (ref 0.0–0.2)

## 2019-01-09 LAB — CMP (CANCER CENTER ONLY)
ALT: 26 U/L (ref 0–44)
AST: 23 U/L (ref 15–41)
Albumin: 3.5 g/dL (ref 3.5–5.0)
Alkaline Phosphatase: 82 U/L (ref 38–126)
Anion gap: 11 (ref 5–15)
BUN: 19 mg/dL (ref 8–23)
CO2: 28 mmol/L (ref 22–32)
Calcium: 9.1 mg/dL (ref 8.9–10.3)
Chloride: 103 mmol/L (ref 98–111)
Creatinine: 0.99 mg/dL (ref 0.61–1.24)
GFR, Est AFR Am: >60 mL/min (ref >60)
GFR, Estimated: >60 mL/min (ref >60)
Glucose, Bld: 125 mg/dL — ABNORMAL HIGH (ref 70–99)
Potassium: 3.4 mmol/L — ABNORMAL LOW (ref 3.5–5.1)
Sodium: 142 mmol/L (ref 135–145)
Total Bilirubin: 0.4 mg/dL (ref 0.3–1.2)
Total Protein: 6.5 g/dL (ref 6.5–8.1)

## 2019-01-09 MED ORDER — HEPARIN SOD (PORK) LOCK FLUSH 100 UNIT/ML IV SOLN
500.0000 [IU] | Freq: Once | INTRAVENOUS | Status: AC | PRN
  Administered 2019-01-09: 500 [IU]
  Filled 2019-01-09: qty 5

## 2019-01-09 MED ORDER — SODIUM CHLORIDE 0.9% FLUSH
10.0000 mL | INTRAVENOUS | Status: DC | PRN
  Administered 2019-01-09: 11:00:00 10 mL
  Filled 2019-01-09: qty 10

## 2019-01-10 LAB — PROSTATE-SPECIFIC AG, SERUM (LABCORP): Prostate Specific Ag, Serum: 0.8 ng/mL (ref 0.0–4.0)

## 2019-01-11 ENCOUNTER — Inpatient Hospital Stay (HOSPITAL_BASED_OUTPATIENT_CLINIC_OR_DEPARTMENT_OTHER): Payer: Medicare Other | Admitting: Oncology

## 2019-01-11 ENCOUNTER — Other Ambulatory Visit: Payer: Self-pay

## 2019-01-11 VITALS — BP 106/60 | HR 70 | Temp 98.8°F | Resp 17 | Ht 67.0 in | Wt 210.8 lb

## 2019-01-11 DIAGNOSIS — C61 Malignant neoplasm of prostate: Secondary | ICD-10-CM | POA: Diagnosis not present

## 2019-01-11 DIAGNOSIS — I10 Essential (primary) hypertension: Secondary | ICD-10-CM

## 2019-01-11 DIAGNOSIS — Z79899 Other long term (current) drug therapy: Secondary | ICD-10-CM

## 2019-01-11 DIAGNOSIS — R569 Unspecified convulsions: Secondary | ICD-10-CM | POA: Diagnosis not present

## 2019-01-11 DIAGNOSIS — R591 Generalized enlarged lymph nodes: Secondary | ICD-10-CM | POA: Diagnosis not present

## 2019-01-11 NOTE — Progress Notes (Signed)
Hematology and Oncology Follow Up Visit  Matthew Serrano [id number] 23-Jul-1946 73 y.o. 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    He presented with PSA of 1170 and a Gleason score 4+5 = 9 disease in the lymph node.   Prior Therapy:  Status post prostate biopsy obtained on 06/06/2018 which showed high-volume disease with a Gleason score 4+5 = 9.  Taxotere chemotherapy at 75 mg/m every 3 weeks started on 07/07/2018.  He he is here for cycle 6 of therapy.  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  Current therapy:   Lupron 30 mg every 4 months with next injection scheduled for August 2020.  Xtandi 160 mg daily started in April 2020.  Interim History: Matthew Serrano returns today for a follow-up.  Since the last visit, he was started on Xtandi which she has tolerated very well.  He denies any nausea, fatigue or edema.  He denies any seizure activity or alteration of mental status.  Performance status and quality of life remain excellent.  He denies any recent hospitalizations or illnesses.  Patient denied headaches, blurry vision, syncope or seizures.  Denies any fevers, chills or sweats.  Denied chest pain, palpitation, orthopnea or leg edema.  Denied cough, wheezing or hemoptysis.  Denied nausea, vomiting or abdominal pain.  Denies any constipation or diarrhea.  Denies any frequency urgency or hesitancy.  Denies any arthralgias or myalgias.  Denies any skin rashes or lesions.  Denies any bleeding or clotting tendency.  Denies any easy bruising.  Denies any hair or nail changes.  Denies any anxiety or depression.  Remaining review of system is negative.            Medications: I have reviewed the patient's current medications.  Current Outpatient Medications  Medication Sig Dispense Refill  .  abiraterone acetate (ZYTIGA) 250 MG tablet TAKE 4 TABLETS (1,000 MG TOTAL) BY MOUTH DAILY. TAKE ON AN EMPTY STOMACH 1 HOUR BEFORE OR 2 HOURS AFTER A MEAL 120 tablet 0  . aspirin EC 81 MG tablet Take 81 mg by mouth daily.    Marland Kitchen atorvastatin (LIPITOR) 40 MG tablet TAKE 1 TABLET (40 MG TOTAL) BY MOUTH DAILY. 90 tablet 3  . cholecalciferol (VITAMIN D3) 25 MCG (1000 UT) tablet Take 1,000 Units by mouth daily.    . cloNIDine (CATAPRES) 0.2 MG tablet TAKE 2 TABLETS BY MOUTH EVERY MORNING AND 2 TABLETS EVERY EVENING 360 tablet 3  . doxazosin (CARDURA) 8 MG tablet TAKE 1 TABLET (8 MG TOTAL) BY MOUTH AT BEDTIME. 90 tablet 3  . fosinopril (MONOPRIL) 40 MG tablet Take 2 tablets (80 mg total) by mouth daily. 200 tablet 3  . furosemide (LASIX) 40 MG tablet TAKE 2 TABLETS (80 MG TOTAL) BY MOUTH EVERY MORNING. 180 tablet 1  . glipiZIDE (GLUCOTROL XL) 10 MG 24 hr tablet TAKE 1 TABLET TWICE DAILY 180 tablet 0  . labetalol (NORMODYNE) 200 MG tablet TAKE 3 TABLETS BY MOUTH EVERY MORNING AND 2 EVERY EVENING 450 tablet 3  . levothyroxine (SYNTHROID, LEVOTHROID) 150 MCG tablet TAKE 1 TABLET EVERY DAY 90 tablet 2  . lidocaine-prilocaine (EMLA) cream Apply 1 application topically as needed. 30 g 0  . metFORMIN (GLUCOPHAGE) 1000 MG tablet Take 1 tablet (1,000 mg total) by mouth 2 (two) times daily with a meal. 180 tablet 0  . NIFEdipine (PROCARDIA  XL/ADALAT-CC) 60 MG 24 hr tablet TAKE 1 TABLET (60 MG TOTAL) BY MOUTH DAILY. 90 tablet 3  . potassium chloride SA (KLOR-CON M20) 20 MEQ tablet TAKE 1 TABLET BY MOUTH TWICE A DAY 180 tablet 3  . predniSONE (DELTASONE) 5 MG tablet Take 1 tablet (5 mg total) by mouth daily with breakfast. 90 tablet 3  . prochlorperazine (COMPAZINE) 10 MG tablet Take 1 tablet (10 mg total) by mouth every 6 (six) hours as needed for nausea or vomiting. (Patient not taking: Reported on 10/20/2018) 30 tablet 0  . sildenafil (VIAGRA) 50 MG tablet Take 1 tablet (50 mg total) by mouth daily as needed for  erectile dysfunction. 10 tablet 3   No current facility-administered medications for this visit.      Allergies: No Known Allergies  Past Medical History, Surgical history, Social history, and Family History were reviewed and updated.    Physical Exam:  Blood pressure 106/60, pulse 70, temperature 98.8 F (37.1 C), temperature source Oral, resp. rate 17, height 5\' 7"  (1.702 m), weight 210 lb 12.8 oz (95.6 kg), SpO2 100 %.     ECOG: 0     General appearance: Comfortable appearing without any discomfort Head: Normocephalic without any trauma Oropharynx: Mucous membranes are moist and pink without any thrush or ulcers. Eyes: Pupils are equal and round reactive to light. Lymph nodes: No cervical, supraclavicular, inguinal or axillary lymphadenopathy.   Heart:regular rate and rhythm.  S1 and S2 without leg edema. Lung: Clear without any rhonchi or wheezes.  No dullness to percussion. Abdomin: Soft, nontender, nondistended with good bowel sounds.  No hepatosplenomegaly. Musculoskeletal: No joint deformity or effusion.  Full range of motion noted. Neurological: No deficits noted on motor, sensory and deep tendon reflex exam. Skin: No petechial rash or dryness.  Appeared moist.   .        Lab Results: Lab Results  Component Value Date   WBC 6.9 01/09/2019   HGB 10.6 (L) 01/09/2019   HCT 33.5 (L) 01/09/2019   MCV 84.0 01/09/2019   PLT 202 01/09/2019     Chemistry      Component Value Date/Time   NA 142 01/09/2019 1127   NA 143 05/16/2018 1126   K 3.4 (L) 01/09/2019 1127   CL 103 01/09/2019 1127   CO2 28 01/09/2019 1127   BUN 19 01/09/2019 1127   BUN 7 (L) 05/16/2018 1126   CREATININE 0.99 01/09/2019 1127      Component Value Date/Time   CALCIUM 9.1 01/09/2019 1127   ALKPHOS 82 01/09/2019 1127   AST 23 01/09/2019 1127   ALT 26 01/09/2019 1127   BILITOT 0.4 01/09/2019 1127      Results for Matthew Serrano, Matthew Serrano (MRN [id number]) as of 01/11/2019 15:48  Ref.  Range 11/28/2018 09:22 01/09/2019 11:27  Prostate Specific Ag, Serum Latest Ref Range: 0.0 - 4.0 ng/mL 82.5 (H) 0.8         Impression and Plan:  73 year old man with:  1.  Advanced prostate cancer that is currently castration-resistant with lymphadenopathy initially documented in 2019.      He developed castration-resistant disease rather quickly and has been started on Xtandi since April 2019.  He had an excellent response with PSA criteria without any major complications.  Risks and benefits of continuing this treatment was discussed today and is agreeable to continue.  Long-term complication of bleeding hypertension and seizures were also reviewed.    2.  IV access: Port-A-Cath will remain in place and  flushed periodically.  3.  Androgen deprivation: He will continue to receive Lupron every 4 months.  Next injection scheduled for August 2020.  Long-term complication were reiterated including weight gain, hot flashes as well as osteoporosis.  5.  Goals of care and prognosis: His disease is incurable but aggressive therapy is warranted given his good performance status.   6.  Follow-up: In 8 weeks for repeat evaluation.  25  minutes was spent with the patient face-to-face today.  More than 50% of time was spent on reviewing his disease status, laboratory data and answering question regarding future plan of care.[name], MD 5/21/20203:54 PM

## 2019-01-22 ENCOUNTER — Other Ambulatory Visit: Payer: Self-pay | Admitting: Oncology

## 2019-01-22 DIAGNOSIS — C61 Malignant neoplasm of prostate: Secondary | ICD-10-CM

## 2019-01-25 ENCOUNTER — Telehealth (HOSPITAL_COMMUNITY): Payer: Self-pay | Admitting: Cardiology

## 2019-01-25 MED FILL — ABIRATERONE ACETATE 250 MG: 250 | 30 days supply | Qty: 120 | Fill #0

## 2019-01-25 NOTE — Telephone Encounter (Signed)

## 2019-01-26 ENCOUNTER — Ambulatory Visit (HOSPITAL_COMMUNITY): Payer: Medicare Other | Attending: Physician Assistant

## 2019-01-26 ENCOUNTER — Telehealth: Payer: Self-pay

## 2019-01-26 ENCOUNTER — Other Ambulatory Visit: Payer: Self-pay | Admitting: Physician Assistant

## 2019-01-26 ENCOUNTER — Other Ambulatory Visit: Payer: Self-pay

## 2019-01-26 ENCOUNTER — Encounter: Payer: Self-pay | Admitting: Physician Assistant

## 2019-01-26 DIAGNOSIS — I350 Nonrheumatic aortic (valve) stenosis: Secondary | ICD-10-CM | POA: Diagnosis not present

## 2019-01-26 IMAGING — NM NM MISC PROCEDURE
6 series · 36 of 36 positions shown · non-contrast
Comparison: none

[Series 1: wbr_s-proj_st stress-gsp · 6.40mm/px · 6 of 512 frames shown]
[frame 43/512]
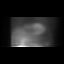
[frame 128/512]
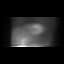
[frame 214/512]
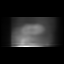
[frame 299/512]
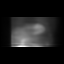
[frame 384/512]
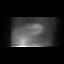
[frame 470/512]
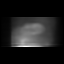

[Series 1: stress-sum-em · 6.40mm/px · 6 of 64 frames shown]
[frame 6/64]
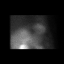
[frame 16/64]
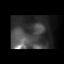
[frame 27/64]
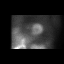
[frame 38/64]
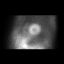
[frame 48/64]
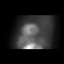
[frame 59/64]
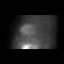

[Series 1: stress-gsp · 6.40mm/px · 6 of 508 frames shown]
[frame 43/508]
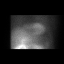
[frame 127/508]
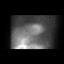
[frame 212/508]
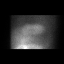
[frame 297/508]
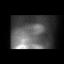
[frame 381/508]
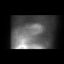
[frame 466/508]
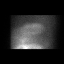

[Series 1: wbr_s-proj_st stress-sum-em · 6.40mm/px · 6 of 64 frames shown]
[frame 6/64]
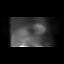
[frame 16/64]
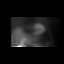
[frame 27/64]
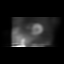
[frame 38/64]
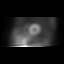
[frame 48/64]
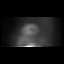
[frame 59/64]
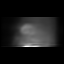

[Series 1: wbr_r-proj_st rest · 6.40mm/px · 6 of 64 frames shown]
[frame 6/64]
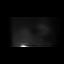
[frame 16/64]
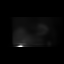
[frame 27/64]
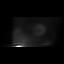
[frame 38/64]
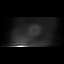
[frame 48/64]
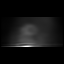
[frame 59/64]
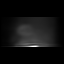

[Series 1: rest · 6.40mm/px · 6 of 64 frames shown]
[frame 6/64]
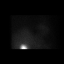
[frame 16/64]
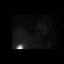
[frame 27/64]
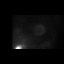
[frame 38/64]
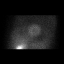
[frame 48/64]
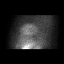
[frame 59/64]
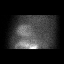

[36 of 36 positions shown; findings below may reference images not displayed]

Canned report from images found in remote index.

Refer to host system for actual result text.

## 2019-01-26 NOTE — Telephone Encounter (Signed)
-----   Message from Liliane Shi, [place] sent at 01/26/2019  1:27 PM EDT ----- Please call the patient.   His echocardiogram shows normal EF, mild diastolic dysfunction, moderate aortic stenosis.  His aortic stenosis is stable since the last study.  Recommendations:  - Continue current medications and follow up as planned.   - Repeat Echo in 1 year.   - Send copy to PCP. Richardson Dopp, PA-C    01/26/2019 1:23 PM

## 2019-01-26 NOTE — Telephone Encounter (Signed)
LMTCB

## 2019-01-26 NOTE — Telephone Encounter (Signed)
Pt verbalized understanding of his Echo results.. sent copy to Dr. [name].

## 2019-02-12 ENCOUNTER — Other Ambulatory Visit: Payer: Self-pay | Admitting: Adult Health

## 2019-02-14 NOTE — Telephone Encounter (Signed)
Sent to the pharmacy by e-scribe.  Reminder set for follow up appt in Aug.

## 2019-02-20 ENCOUNTER — Other Ambulatory Visit: Payer: Self-pay | Admitting: Oncology

## 2019-02-20 DIAGNOSIS — C61 Malignant neoplasm of prostate: Secondary | ICD-10-CM

## 2019-02-27 MED FILL — ABIRATERONE ACETATE 250 MG: 250 | 30 days supply | Qty: 120 | Fill #0

## 2019-03-02 ENCOUNTER — Telehealth: Payer: Self-pay | Admitting: Oncology

## 2019-03-02 ENCOUNTER — Other Ambulatory Visit: Payer: Self-pay | Admitting: Adult Health

## 2019-03-02 ENCOUNTER — Telehealth: Payer: Self-pay

## 2019-03-02 DIAGNOSIS — I10 Essential (primary) hypertension: Secondary | ICD-10-CM

## 2019-03-02 NOTE — Telephone Encounter (Signed)
Sent to the pharmacy by e-scribe for 90 days. 

## 2019-03-02 NOTE — Telephone Encounter (Signed)
Scheduled appt per 7/10 sch message - unable to reach pt -left message with appt date and time

## 2019-03-02 NOTE — Telephone Encounter (Signed)
Oral Oncology Patient Advocate Encounter   Was successful in securing patient a $7500 grant from [employer] to provide copayment coverage for Zytiga.  This will keep the out of pocket expense at $0.     I have spoken with the patient.    The billing information is as follows and has been shared with [employer].   Member ID: [id number] Group ID: [facility] RxBin: [id number] PCN: [id number] Dates of Eligibility: 03/01/19 through 02/29/20  [employer] [facility] [place] Phone [phone] Fax [phone] 03/02/2019    2:27 PM

## 2019-03-09 ENCOUNTER — Inpatient Hospital Stay: Payer: Medicare Other | Attending: Oncology | Admitting: Oncology

## 2019-03-09 ENCOUNTER — Telehealth: Payer: Self-pay | Admitting: Oncology

## 2019-03-09 ENCOUNTER — Inpatient Hospital Stay: Payer: Medicare Other

## 2019-03-09 ENCOUNTER — Other Ambulatory Visit: Payer: Self-pay

## 2019-03-09 VITALS — BP 114/62 | HR 60 | Temp 98.2°F | Resp 17 | Ht 67.0 in | Wt 212.9 lb

## 2019-03-09 DIAGNOSIS — C61 Malignant neoplasm of prostate: Secondary | ICD-10-CM

## 2019-03-09 DIAGNOSIS — Z79899 Other long term (current) drug therapy: Secondary | ICD-10-CM | POA: Diagnosis not present

## 2019-03-09 LAB — CBC WITH DIFFERENTIAL (CANCER CENTER ONLY)
Abs Immature Granulocytes: 0.02 10*3/uL (ref 0.00–0.07)
Basophils Absolute: 0 10*3/uL (ref 0.0–0.1)
Basophils Relative: 1 %
Eosinophils Absolute: 0.2 10*3/uL (ref 0.0–0.5)
Eosinophils Relative: 3 %
HCT: 30.9 % — ABNORMAL LOW (ref 39.0–52.0)
Hemoglobin: 10 g/dL — ABNORMAL LOW (ref 13.0–17.0)
Immature Granulocytes: 0 %
Lymphocytes Relative: 38 %
Lymphs Abs: 1.9 10*3/uL (ref 0.7–4.0)
MCH: 26.7 pg (ref 26.0–34.0)
MCHC: 32.4 g/dL (ref 30.0–36.0)
MCV: 82.6 fL (ref 80.0–100.0)
Monocytes Absolute: 0.5 10*3/uL (ref 0.1–1.0)
Monocytes Relative: 9 %
Neutro Abs: 2.4 10*3/uL (ref 1.7–7.7)
Neutrophils Relative %: 49 %
Platelet Count: 211 10*3/uL (ref 150–400)
RBC: 3.74 MIL/uL — ABNORMAL LOW (ref 4.22–5.81)
RDW: 15.8 % — ABNORMAL HIGH (ref 11.5–15.5)
WBC Count: 5 10*3/uL (ref 4.0–10.5)
nRBC: 0 % (ref 0.0–0.2)

## 2019-03-09 LAB — CMP (CANCER CENTER ONLY)
ALT: 24 U/L (ref 0–44)
AST: 21 U/L (ref 15–41)
Albumin: 3.5 g/dL (ref 3.5–5.0)
Alkaline Phosphatase: 68 U/L (ref 38–126)
Anion gap: 11 (ref 5–15)
BUN: 20 mg/dL (ref 8–23)
CO2: 24 mmol/L (ref 22–32)
Calcium: 8.6 mg/dL — ABNORMAL LOW (ref 8.9–10.3)
Chloride: 105 mmol/L (ref 98–111)
Creatinine: 0.83 mg/dL (ref 0.61–1.24)
GFR, Est AFR Am: >60 mL/min (ref >60)
GFR, Estimated: >60 mL/min (ref >60)
Glucose, Bld: 123 mg/dL — ABNORMAL HIGH (ref 70–99)
Potassium: 3.5 mmol/L (ref 3.5–5.1)
Sodium: 140 mmol/L (ref 135–145)
Total Bilirubin: 0.7 mg/dL (ref 0.3–1.2)
Total Protein: 6.1 g/dL — ABNORMAL LOW (ref 6.5–8.1)

## 2019-03-09 MED ORDER — HEPARIN SOD (PORK) LOCK FLUSH 100 UNIT/ML IV SOLN
500.0000 [IU] | Freq: Once | INTRAVENOUS | Status: AC | PRN
  Administered 2019-03-09: 500 [IU]
  Filled 2019-03-09: qty 5

## 2019-03-09 MED ORDER — SODIUM CHLORIDE 0.9% FLUSH
10.0000 mL | INTRAVENOUS | Status: DC | PRN
  Administered 2019-03-09: 10 mL
  Filled 2019-03-09: qty 10

## 2019-03-09 NOTE — Telephone Encounter (Signed)
Scheduled appt per 7/17 los.  Printed calendar and avs.

## 2019-03-09 NOTE — Progress Notes (Signed)
Hematology and Oncology Follow Up Visit  Matthew Serrano [id number] August 07, 1946 73 y.o. 03/09/2019 10:12 AM Matthew Serrano, Matthew Serrano, Matthew Rumps, NP   Principle Diagnosis: 73 year old man with advanced prostate cancer with lymphadenopathy diagnosed in October 2019.  He has castration-resistant disease after presenting with PSA of 1170 and a Gleason score 4+5 = 9.    Prior Therapy:  Status post prostate biopsy obtained on 06/06/2018 which showed high-volume disease with a Gleason score 4+5 = 9.  Taxotere chemotherapy at 75 mg/m every 3 weeks started on 07/07/2018.  He he is here for cycle 6 of therapy.  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  Current therapy:   Lupron 30 mg every 4 months with next injection scheduled for August 2020.  Xtandi 160 mg daily started in April 2020.  Interim History: Matthew Serrano is here for a follow-up.  Since last visit, he reports no major changes in his health.  He continues to tolerate Xtandi without any major complaints.  He denies any excessive fatigue, tiredness but does report some hot flashes and sweating that is manageable.  His appetite remains excellent without any complaints.  He denies any hematuria or seizures.  He denies any recent hospitalizations or illnesses.  He denied any alteration mental status, neuropathy, confusion or dizziness.  Denies any headaches or lethargy.  Denies any night sweats, weight loss or changes in appetite.  Denied orthopnea, dyspnea on exertion or chest discomfort.  Denies shortness of breath, difficulty breathing hemoptysis or cough.  Denies any abdominal distention, nausea, early satiety or dyspepsia.  Denies any hematuria, frequency, dysuria or nocturia.  Denies any skin irritation, dryness or rash.  Denies any ecchymosis or petechiae.  Denies any lymphadenopathy or clotting.  Denies any heat or cold intolerance.  Denies any anxiety or depression.  Remaining review of  system is negative.              Medications: No changes noted by my review. Current Outpatient Medications  Medication Sig Dispense Refill  . abiraterone acetate (ZYTIGA) 250 MG tablet TAKE 4 TABLETS (1,000 MG TOTAL) BY MOUTH DAILY. TAKE ON AN EMPTY STOMACH 1 HOUR BEFORE OR 2 HOURS AFTER A MEAL 120 tablet 0  . aspirin EC 81 MG tablet Take 81 mg by mouth daily.    Marland Kitchen atorvastatin (LIPITOR) 40 MG tablet TAKE 1 TABLET (40 MG TOTAL) BY MOUTH DAILY. 90 tablet 3  . cholecalciferol (VITAMIN D3) 25 MCG (1000 UT) tablet Take 1,000 Units by mouth daily.    . cloNIDine (CATAPRES) 0.2 MG tablet TAKE 2 TABLETS BY MOUTH EVERY MORNING AND 2 TABLETS EVERY EVENING 360 tablet 3  . doxazosin (CARDURA) 8 MG tablet TAKE 1 TABLET (8 MG TOTAL) BY MOUTH AT BEDTIME. 90 tablet 3  . fosinopril (MONOPRIL) 40 MG tablet Take 2 tablets (80 mg total) by mouth daily. 200 tablet 3  . furosemide (LASIX) 40 MG tablet TAKE 2 TABLETS (80 MG TOTAL) BY MOUTH EVERY MORNING. 180 tablet 0  . glipiZIDE (GLUCOTROL XL) 10 MG 24 hr tablet TAKE 1 TABLET TWICE DAILY 180 tablet 0  . labetalol (NORMODYNE) 200 MG tablet TAKE 3 TABLETS BY MOUTH EVERY MORNING AND 2 EVERY EVENING 450 tablet 3  . levothyroxine (SYNTHROID, LEVOTHROID) 150 MCG tablet TAKE 1 TABLET EVERY DAY 90 tablet 2  . lidocaine-prilocaine (EMLA) cream Apply 1 application topically as needed. 30 g 0  . metFORMIN (GLUCOPHAGE) 1000 MG tablet TAKE 1 TABLET (1,000  MG TOTAL) BY MOUTH 2 (TWO) TIMES DAILY WITH A MEAL. 180 tablet 0  . NIFEdipine (PROCARDIA XL/ADALAT-CC) 60 MG 24 hr tablet TAKE 1 TABLET (60 MG TOTAL) BY MOUTH DAILY. 90 tablet 3  . potassium chloride SA (KLOR-CON M20) 20 MEQ tablet TAKE 1 TABLET BY MOUTH TWICE A DAY 180 tablet 3  . predniSONE (DELTASONE) 5 MG tablet Take 1 tablet (5 mg total) by mouth daily with breakfast. 90 tablet 3  . prochlorperazine (COMPAZINE) 10 MG tablet Take 1 tablet (10 mg total) by mouth every 6 (six) hours as needed for nausea or  vomiting. (Patient not taking: Reported on 10/20/2018) 30 tablet 0  . sildenafil (VIAGRA) 50 MG tablet Take 1 tablet (50 mg total) by mouth daily as needed for erectile dysfunction. 10 tablet 3   No current facility-administered medications for this visit.    Facility-Administered Medications Ordered in Other Visits  Medication Dose Route Frequency Provider Last Rate Last Dose  . sodium chloride flush (NS) 0.9 % injection 10 mL  10 mL Intracatheter PRN Wyatt Portela, MD   10 mL at 03/09/19 1003     Allergies: No Known Allergies  Past Medical History, Surgical history, Social history, and Family History no changes by review.    Physical Exam:   Blood pressure 114/62, pulse 60, temperature 98.2 F (36.8 C), temperature source Oral, resp. rate 17, height 5\' 7"  (1.702 m), weight 212 lb 14.4 oz (96.6 kg), SpO2 100 %.     ECOG: 0   General appearance: Alert, awake without any distress. Head: Atraumatic without abnormalities Oropharynx: Without any thrush or ulcers. Eyes: No scleral icterus. Lymph nodes: No lymphadenopathy noted in the cervical, supraclavicular, or axillary nodes Heart:regular rate and rhythm, without any murmurs or gallops.   Lung: Clear to auscultation without any rhonchi, wheezes or dullness to percussion. Abdomin: Soft, nontender without any shifting dullness or ascites. Musculoskeletal: No clubbing or cyanosis. Neurological: No motor or sensory deficits. Skin: No rashes or lesions.        Lab Results: Lab Results  Component Value Date   WBC 6.9 01/09/2019   HGB 10.6 (L) 01/09/2019   HCT 33.5 (L) 01/09/2019   MCV 84.0 01/09/2019   PLT 202 01/09/2019     Chemistry      Component Value Date/Time   NA 142 01/09/2019 1127   NA 143 05/16/2018 1126   K 3.4 (L) 01/09/2019 1127   CL 103 01/09/2019 1127   CO2 28 01/09/2019 1127   BUN 19 01/09/2019 1127   BUN 7 (L) 05/16/2018 1126   CREATININE 0.99 01/09/2019 1127      Component Value Date/Time    CALCIUM 9.1 01/09/2019 1127   ALKPHOS 82 01/09/2019 1127   AST 23 01/09/2019 1127   ALT 26 01/09/2019 1127   BILITOT 0.4 01/09/2019 1127        Results for Matthew Serrano (MRN [id number]) as of 03/09/2019 10:12  Ref. Range 11/28/2018 09:22 01/09/2019 11:27  Prostate Specific Ag, Serum Latest Ref Range: 0.0 - 4.0 ng/mL 82.5 (H) 0.8        Impression and Plan:  73 year old man with:  1. Castration-resistant advanced prostate cancer with lymph node involvement diagnosed in 2019.     He remains on Xtandi without any major complications.  His PSA continues to show excellent response with decline down to 0.8 after 1 month of therapy.  Risks and benefits of continuing this therapy versus alternative therapies were reviewed.  Complications from  this treatment long-term including hypertension, hematuria and rarely seizures noted.  After discussion today is agreeable to continue.  Different salvage therapy such as systemic chemotherapy might possibly be needed in the future.  Trudi Ida could also be an option if he develops bone disease.    2.  IV access: Port-A-Cath will continue to be flushed periodically.  3.  Androgen deprivation: He is currently on Lupron which she will receive every 4 months.  Long-term complication associated with this therapy include weight gain, osteoporosis, and hot flashes.  He is agreeable to continue.  5.  Goals of care and prognosis: Therapy remains palliative but his disease is under excellent control with excellent performance status.  Aggressive measures are warranted.   6.  Follow-up: Will be in 8 weeks for repeat evaluation and Lupron injection.  25  minutes was spent with the patient face-to-face today.  More than 50% of time was dedicated to reviewing his laboratory data, disease status, treatment options and addressing future plan of care.     [name], MD 7/17/202010:12 AM

## 2019-03-09 NOTE — Patient Instructions (Signed)

## 2019-03-10 LAB — PROSTATE-SPECIFIC AG, SERUM (LABCORP): Prostate Specific Ag, Serum: <0.1 ng/mL (ref 0.0–4.0)

## 2019-03-12 ENCOUNTER — Telehealth: Payer: Self-pay

## 2019-03-12 MED FILL — predniSONE 5 MG TABS: 5 | 90 days supply | Qty: 90 | Fill #1

## 2019-03-12 NOTE — Telephone Encounter (Signed)
Contacted patient and left message with PSA results and to call back with any questions or concerns. 

## 2019-03-12 NOTE — Telephone Encounter (Signed)
-----   Message from Wyatt Portela, MD sent at 03/12/2019  8:20 AM EDT ----- Please let him know his PSA is down again.

## 2019-03-23 ENCOUNTER — Other Ambulatory Visit: Payer: Self-pay | Admitting: Oncology

## 2019-03-23 DIAGNOSIS — C61 Malignant neoplasm of prostate: Secondary | ICD-10-CM

## 2019-03-27 MED FILL — ABIRATERONE ACETATE 250 MG: 250 | 30 days supply | Qty: 120 | Fill #0

## 2019-03-31 ENCOUNTER — Other Ambulatory Visit: Payer: Self-pay | Admitting: Adult Health

## 2019-03-31 DIAGNOSIS — I10 Essential (primary) hypertension: Secondary | ICD-10-CM

## 2019-04-04 NOTE — Telephone Encounter (Signed)
DENIED.  FILLED FOR 1 YEAR ON 05/2018.  REQUEST IS TOO EARLY.  MESSAGE SENT TO THE PHARMACY.

## 2019-04-20 ENCOUNTER — Other Ambulatory Visit: Payer: Self-pay | Admitting: Adult Health

## 2019-04-23 ENCOUNTER — Other Ambulatory Visit: Payer: Self-pay | Admitting: Oncology

## 2019-04-23 DIAGNOSIS — C61 Malignant neoplasm of prostate: Secondary | ICD-10-CM

## 2019-04-24 ENCOUNTER — Other Ambulatory Visit: Payer: Self-pay | Admitting: Family Medicine

## 2019-04-24 DIAGNOSIS — E119 Type 2 diabetes mellitus without complications: Secondary | ICD-10-CM

## 2019-04-24 NOTE — Telephone Encounter (Signed)
Upcoming follow up.

## 2019-04-25 ENCOUNTER — Other Ambulatory Visit (INDEPENDENT_AMBULATORY_CARE_PROVIDER_SITE_OTHER): Payer: Medicare Other

## 2019-04-25 ENCOUNTER — Other Ambulatory Visit: Payer: Self-pay

## 2019-04-25 DIAGNOSIS — E119 Type 2 diabetes mellitus without complications: Secondary | ICD-10-CM | POA: Diagnosis not present

## 2019-04-25 LAB — HEMOGLOBIN A1C: Hgb A1c MFr Bld: 7.5 % — ABNORMAL HIGH (ref 4.6–6.5)

## 2019-04-26 ENCOUNTER — Encounter: Payer: Self-pay | Admitting: Adult Health

## 2019-04-26 ENCOUNTER — Other Ambulatory Visit: Payer: Self-pay | Admitting: Adult Health

## 2019-04-26 ENCOUNTER — Other Ambulatory Visit: Payer: Self-pay

## 2019-04-26 ENCOUNTER — Ambulatory Visit (INDEPENDENT_AMBULATORY_CARE_PROVIDER_SITE_OTHER): Payer: Medicare Other | Admitting: Adult Health

## 2019-04-26 DIAGNOSIS — E119 Type 2 diabetes mellitus without complications: Secondary | ICD-10-CM

## 2019-04-26 DIAGNOSIS — I10 Essential (primary) hypertension: Secondary | ICD-10-CM

## 2019-04-26 MED ORDER — GLIPIZIDE ER 10 MG PO TB24: ORAL_TABLET | ORAL | 0 refills | Status: DC

## 2019-04-26 MED FILL — ABIRATERONE ACETATE 250 MG: 250 | 30 days supply | Qty: 120 | Fill #0

## 2019-04-26 NOTE — Progress Notes (Signed)
Virtual Visit via Telephone Note  I connected with Matthew Serrano. on 04/26/19 at  1:00 PM EDT by telephone and verified that I am speaking with the correct person using two identifiers.   I discussed the limitations, risks, security and privacy concerns of performing an evaluation and management service by telephone and the availability of in person appointments. I also discussed with the patient that there may be a patient responsible charge related to this service. The patient expressed understanding and agreed to proceed.  Location patient: home Location provider: work or home office Participants present for the call: patient, provider Patient did not have a visit in the prior 7 days to address this/these issue(s).   History of Present IllnHe presents to the office today for follow-up regarding diabetes.  He is currently maintained on glipizide 10 mg extended release twice daily and metformin 1000 mg twice daily.  Unfortunately does not check his blood sugars on a routine basis.  He has been seen by oncology over the last few months and is currently being treated for prostate cancer, part of his treatment is daily prednisone therapy of 5 mg.  His blood sugars per labs have been maintained in the 120s to 130s.  He denies any symptoms of hypoglycemia at home.  He continues to eat sweets and carbs but does report that he has been working on reducing his intake to help his blood sugars as well as help him lose weight.  His last A1c 4 months ago was 7.5ess:    Observations/Objective: Patient sounds cheerful and well on the phone. I do not appreciate any SOB. Speech and thought processing are grossly intact. Patient reported vitals:  Assessment and Plan: 1. Type 2 diabetes mellitus without complication, without long-term current use of insulin (HCC) -We spoke about getting A1c under 7.0.  He would prefer not to go on another medication at this time although it would be recommended.  He  would like to continue to work on lifestyle modifications.  Per prescription refill history it appears as though he ran out of glipizide about a month and 1/2 to 2 months ago but he reports taking it on a daily basis.  Will send this in for refill.  We will follow-up in 3 months for his physical exam at which time we will recheck the A1c - glipiZIDE (GLUCOTROL XL) 10 MG 24 hr tablet; TAKE 1 TABLET TWICE DAILY  Dispense: 180 tablet; Refill: 0  Follow Up Instructions:   I did not refer this patient for an OV in the next 24 hours for this/these issue(s).  I discussed the assessment and treatment plan with the patient. The patient was provided an opportunity to ask questions and all were answered. The patient agreed with the plan and demonstrated an understanding of the instructions.   The patient was advised to call back or seek an in-person evaluation if the symptoms worsen or if the condition fails to improve as anticipated.  I provided 15 minutes of non-face-to-face time during this encounter.   [name], NP

## 2019-04-27 ENCOUNTER — Inpatient Hospital Stay: Payer: Medicare Other | Attending: Oncology

## 2019-04-27 ENCOUNTER — Inpatient Hospital Stay: Payer: Medicare Other

## 2019-04-27 ENCOUNTER — Inpatient Hospital Stay (HOSPITAL_BASED_OUTPATIENT_CLINIC_OR_DEPARTMENT_OTHER): Payer: Medicare Other | Admitting: Oncology

## 2019-04-27 ENCOUNTER — Other Ambulatory Visit: Payer: Self-pay

## 2019-04-27 VITALS — BP 109/75 | HR 62 | Temp 98.3°F | Resp 18 | Wt 217.8 lb

## 2019-04-27 DIAGNOSIS — I10 Essential (primary) hypertension: Secondary | ICD-10-CM | POA: Insufficient documentation

## 2019-04-27 DIAGNOSIS — C61 Malignant neoplasm of prostate: Secondary | ICD-10-CM | POA: Insufficient documentation

## 2019-04-27 DIAGNOSIS — Z7982 Long term (current) use of aspirin: Secondary | ICD-10-CM | POA: Diagnosis not present

## 2019-04-27 DIAGNOSIS — Z7984 Long term (current) use of oral hypoglycemic drugs: Secondary | ICD-10-CM | POA: Diagnosis not present

## 2019-04-27 DIAGNOSIS — E876 Hypokalemia: Secondary | ICD-10-CM | POA: Insufficient documentation

## 2019-04-27 DIAGNOSIS — Z79899 Other long term (current) drug therapy: Secondary | ICD-10-CM | POA: Diagnosis not present

## 2019-04-27 LAB — CBC WITH DIFFERENTIAL (CANCER CENTER ONLY)
Abs Immature Granulocytes: 0.03 10*3/uL (ref 0.00–0.07)
Basophils Absolute: 0.1 10*3/uL (ref 0.0–0.1)
Basophils Relative: 1 %
Eosinophils Absolute: 0.2 10*3/uL (ref 0.0–0.5)
Eosinophils Relative: 3 %
HCT: 31 % — ABNORMAL LOW (ref 39.0–52.0)
Hemoglobin: 9.7 g/dL — ABNORMAL LOW (ref 13.0–17.0)
Immature Granulocytes: 1 %
Lymphocytes Relative: 26 %
Lymphs Abs: 1.7 10*3/uL (ref 0.7–4.0)
MCH: 27.2 pg (ref 26.0–34.0)
MCHC: 31.3 g/dL (ref 30.0–36.0)
MCV: 86.8 fL (ref 80.0–100.0)
Monocytes Absolute: 0.4 10*3/uL (ref 0.1–1.0)
Monocytes Relative: 7 %
Neutro Abs: 4 10*3/uL (ref 1.7–7.7)
Neutrophils Relative %: 62 %
Platelet Count: 227 10*3/uL (ref 150–400)
RBC: 3.57 MIL/uL — ABNORMAL LOW (ref 4.22–5.81)
RDW: 15.3 % (ref 11.5–15.5)
WBC Count: 6.4 10*3/uL (ref 4.0–10.5)
nRBC: 0 % (ref 0.0–0.2)

## 2019-04-27 LAB — CMP (CANCER CENTER ONLY)
ALT: 25 U/L (ref 0–44)
AST: 22 U/L (ref 15–41)
Albumin: 3.7 g/dL (ref 3.5–5.0)
Alkaline Phosphatase: 61 U/L (ref 38–126)
Anion gap: 12 (ref 5–15)
BUN: 15 mg/dL (ref 8–23)
CO2: 25 mmol/L (ref 22–32)
Calcium: 8.9 mg/dL (ref 8.9–10.3)
Chloride: 104 mmol/L (ref 98–111)
Creatinine: 0.88 mg/dL (ref 0.61–1.24)
GFR, Est AFR Am: >60 mL/min (ref >60)
GFR, Estimated: >60 mL/min (ref >60)
Glucose, Bld: 182 mg/dL — ABNORMAL HIGH (ref 70–99)
Potassium: 3.5 mmol/L (ref 3.5–5.1)
Sodium: 141 mmol/L (ref 135–145)
Total Bilirubin: 0.6 mg/dL (ref 0.3–1.2)
Total Protein: 6.3 g/dL — ABNORMAL LOW (ref 6.5–8.1)

## 2019-04-27 MED ORDER — HEPARIN SOD (PORK) LOCK FLUSH 100 UNIT/ML IV SOLN
500.0000 [IU] | Freq: Once | INTRAVENOUS | Status: AC | PRN
  Administered 2019-04-27: 09:00:00 500 [IU]
  Filled 2019-04-27: qty 5

## 2019-04-27 MED ORDER — SODIUM CHLORIDE 0.9% FLUSH
10.0000 mL | INTRAVENOUS | Status: DC | PRN
  Administered 2019-04-27: 09:00:00 10 mL
  Filled 2019-04-27: qty 10

## 2019-04-27 MED ORDER — LEUPROLIDE ACETATE (4 MONTH) 30 MG ~~LOC~~ KIT
30.0000 mg | PACK | Freq: Once | SUBCUTANEOUS | Status: AC
  Administered 2019-04-27: 30 mg via SUBCUTANEOUS
  Filled 2019-04-27: qty 30

## 2019-04-27 MED ORDER — LEUPROLIDE ACETATE (4 MONTH) 30 MG IM KIT
30.0000 mg | PACK | Freq: Once | INTRAMUSCULAR | Status: DC
  Filled 2019-04-27: qty 30

## 2019-04-27 NOTE — Patient Instructions (Signed)

## 2019-04-27 NOTE — Patient Instructions (Signed)

## 2019-04-27 NOTE — Telephone Encounter (Signed)
Sent to the pharmacy by e-scribe for 90 days.  I have scheduled the pt for his schedule.

## 2019-04-27 NOTE — Progress Notes (Signed)
Hematology and Oncology Follow Up Visit  Caius Connelley [id number] 1946-08-13 73 y.o. 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  He presented with PSA of 1170 and a Gleason score 4+5 = 9 at the time of diagnosis.   Prior Therapy:  Status post prostate biopsy obtained on 06/06/2018 which showed high-volume disease with a Gleason score 4+5 = 9.  Taxotere chemotherapy at 75 mg/m every 3 weeks started on 07/07/2018.  He he is here for cycle 6 of therapy.  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  Current therapy:   Lupron 30 mg every 4 months with next injection to be given on 04/27/2019 and repeated in 4 months.  Zytiga 1000 mg daily started in April 2020.  Interim History: Mr. Mcdevitt returns today for a repeat evaluation.  Since the last visit, he continues to tolerate Zytiga without any major complaints.  He does report to some mild fatigue, tiredness lower extremity edema that has been unchanged.  He denies any nausea, abdominal pain or worsening bone disease.  He remains active and continues to attend activities of daily living.  He denied headaches, blurry vision, syncope or seizures.  Denies any fevers, chills or sweats.  Denied chest pain, palpitation, orthopnea or leg edema.  Denied cough, wheezing or hemoptysis.  Denied nausea, vomiting or abdominal pain.  Denies any constipation or diarrhea.  Denies any frequency urgency or hesitancy.  Denies any arthralgias or myalgias.  Denies any skin rashes or lesions.  Denies any bleeding or clotting tendency.  Denies any easy bruising.  Denies any hair or nail changes.  Denies any anxiety or depression.  Remaining review of system is negative.               Medications: Updated on review today. Current Outpatient Medications  Medication  Sig Dispense Refill  . abiraterone acetate (ZYTIGA) 250 MG tablet TAKE 4 TABLETS (1,000 MG TOTAL) BY MOUTH DAILY. TAKE ON AN EMPTY STOMACH 1 HOUR BEFORE OR 2 HOURS AFTER A MEAL 120 tablet 0  . aspirin EC 81 MG tablet Take 81 mg by mouth daily.    Marland Kitchen atorvastatin (LIPITOR) 40 MG tablet TAKE 1 TABLET (40 MG TOTAL) BY MOUTH DAILY. 90 tablet 3  . cholecalciferol (VITAMIN D3) 25 MCG (1000 UT) tablet Take 1,000 Units by mouth daily.    . cloNIDine (CATAPRES) 0.2 MG tablet TAKE 2 TABLETS BY MOUTH EVERY MORNING AND 2 TABLETS EVERY EVENING 360 tablet 3  . doxazosin (CARDURA) 8 MG tablet TAKE 1 TABLET (8 MG TOTAL) BY MOUTH AT BEDTIME. 90 tablet 3  . fosinopril (MONOPRIL) 40 MG tablet Take 2 tablets (80 mg total) by mouth daily. 200 tablet 3  . furosemide (LASIX) 40 MG tablet TAKE 2 TABLETS (80 MG TOTAL) BY MOUTH EVERY MORNING. 180 tablet 0  . glipiZIDE (GLUCOTROL XL) 10 MG 24 hr tablet TAKE 1 TABLET TWICE DAILY 180 tablet 0  . labetalol (NORMODYNE) 200 MG tablet TAKE 3 TABLETS BY MOUTH EVERY MORNING AND 2 EVERY EVENING 450 tablet 3  . levothyroxine (SYNTHROID, LEVOTHROID) 150 MCG tablet TAKE 1 TABLET EVERY DAY 90 tablet 2  . lidocaine-prilocaine (EMLA) cream Apply 1 application topically as needed. 30 g 0  . metFORMIN (GLUCOPHAGE) 1000 MG tablet TAKE 1 TABLET (1,000 MG TOTAL) BY MOUTH 2 (TWO) TIMES DAILY WITH A MEAL. 180 tablet  0  . NIFEdipine (PROCARDIA XL/ADALAT-CC) 60 MG 24 hr tablet TAKE 1 TABLET (60 MG TOTAL) BY MOUTH DAILY. 90 tablet 3  . potassium chloride SA (KLOR-CON M20) 20 MEQ tablet TAKE 1 TABLET BY MOUTH TWICE A DAY 180 tablet 3  . predniSONE (DELTASONE) 5 MG tablet Take 1 tablet (5 mg total) by mouth daily with breakfast. 90 tablet 3  . sildenafil (VIAGRA) 50 MG tablet Take 1 tablet (50 mg total) by mouth daily as needed for erectile dysfunction. 10 tablet 3   No current facility-administered medications for this visit.    Facility-Administered Medications Ordered in Other Visits  Medication  Dose Route Frequency Provider Last Rate Last Dose  . sodium chloride flush (NS) 0.9 % injection 10 mL  10 mL Intracatheter PRN Wyatt Portela, MD   10 mL at 04/27/19 [id number]     Allergies: No Known Allergies  Past Medical History, Surgical history, Social history, and Family History without any change on review.    Physical Exam:   Blood pressure 109/75, pulse 62, temperature 98.3 F (36.8 C), temperature source Temporal, resp. rate 18, weight 217 lb 12.8 oz (98.8 kg), SpO2 100 %.     ECOG: 0    General appearance: Comfortable appearing without any discomfort Head: Normocephalic without any trauma Oropharynx: Mucous membranes are moist and pink without any thrush or ulcers. Eyes: Pupils are equal and round reactive to light. Lymph nodes: No cervical, supraclavicular, inguinal or axillary lymphadenopathy.   Heart:regular rate and rhythm.  S1 and S2 without leg edema. Lung: Clear without any rhonchi or wheezes.  No dullness to percussion. Abdomin: Soft, nontender, nondistended with good bowel sounds.  No hepatosplenomegaly. Musculoskeletal: No joint deformity or effusion.  Full range of motion noted. Neurological: No deficits noted on motor, sensory and deep tendon reflex exam. Skin: No petechial rash or dryness.  Appeared moist.          Lab Results: Lab Results  Component Value Date   WBC 6.4 04/27/2019   HGB 9.7 (L) 04/27/2019   HCT 31.0 (L) 04/27/2019   MCV 86.8 04/27/2019   PLT 227 04/27/2019     Chemistry      Component Value Date/Time   NA 140 03/09/2019 0947   NA 143 05/16/2018 1126   K 3.5 03/09/2019 0947   CL 105 03/09/2019 0947   CO2 24 03/09/2019 0947   BUN 20 03/09/2019 0947   BUN 7 (L) 05/16/2018 1126   CREATININE 0.83 03/09/2019 0947      Component Value Date/Time   CALCIUM 8.6 (L) 03/09/2019 0947   ALKPHOS 68 03/09/2019 0947   AST 21 03/09/2019 0947   ALT 24 03/09/2019 0947   BILITOT 0.7 03/09/2019 0947       Results for Guerin,  TRADEN ARNZEN (MRN [id number]) as of 04/27/2019 09:16  Ref. Range 11/28/2018 09:22 01/09/2019 11:27 03/09/2019 09:47  Prostate Specific Ag, Serum Latest Ref Range: 0.0 - 4.0 ng/mL 82.5 (H) 0.8 <0.1          Impression and Plan:  73 year old man with:  1.  Advanced prostate cancer with lymphadenopathy diagnosed in 2019.  He developed castration-resistant subsequently developed.     He has tolerated Zytiga with excellent PSA response so far.  His PSA has declined from 82.5 to currently undetectable.  Risks and benefits of continuing this therapy was discussed today.  Potential complication occluding hypertension, edema, adrenal insufficiency as well as hypokalemia.  Based on these findings including excellent PSA response  he is agreeable to continue.   2.  IV access: Port-A-Cath currently in use and will be flushed periodically.  3.  Androgen deprivation: Risks and benefits of continuing Lupron along term was discussed.  Complication including weight gain, hot flashes and osteoporosis were reiterated.  He will receive injection today and repeated in 4 months and he is agreeable.  5.  Goals of care and prognosis: Therapy remains palliative although aggressive measures are warranted at this time given his excellent performance status.  6.  Hypokalemia: His potassium has been normal and will continue to be repeated and supplemented as needed.  7.  Hypertension: His blood pressure is adequate at this time and will continue to be monitored on Zytiga.  8.  Follow-up: In 2 months for repeat evaluation.  25  minutes was spent with the patient face-to-face today.  More than 50% of time was spent on updating his disease status, treatment options and complications related therapy.     [name], MD 9/4/20209:25 AM

## 2019-04-27 NOTE — Telephone Encounter (Signed)
Sent to the pharmacy by e-scribe for 90 days.  Pt has upcoming cpx. 

## 2019-04-28 LAB — PROSTATE-SPECIFIC AG, SERUM (LABCORP): Prostate Specific Ag, Serum: <0.1 ng/mL (ref 0.0–4.0)

## 2019-05-01 ENCOUNTER — Telehealth: Payer: Self-pay | Admitting: Oncology

## 2019-05-01 ENCOUNTER — Telehealth: Payer: Self-pay

## 2019-05-01 NOTE — Telephone Encounter (Signed)
Called and left msg. Mailed printout  °

## 2019-05-01 NOTE — Telephone Encounter (Signed)
-----   Message from Wyatt Portela, MD sent at 05/01/2019  9:03 AM EDT ----- Please let him know his PSA is low.

## 2019-05-01 NOTE — Telephone Encounter (Signed)
Contacted patient and left message with results and to call back with any questions or concerns. 

## 2019-05-17 ENCOUNTER — Other Ambulatory Visit: Payer: Self-pay | Admitting: Adult Health

## 2019-05-17 DIAGNOSIS — I10 Essential (primary) hypertension: Secondary | ICD-10-CM

## 2019-05-18 NOTE — Telephone Encounter (Signed)
Sent to the pharmacy by e-scribe for 90 days.  Pt has upcoming cpx. 

## 2019-05-22 ENCOUNTER — Other Ambulatory Visit: Payer: Self-pay | Admitting: Oncology

## 2019-05-22 DIAGNOSIS — C61 Malignant neoplasm of prostate: Secondary | ICD-10-CM

## 2019-05-24 ENCOUNTER — Other Ambulatory Visit: Payer: Self-pay | Admitting: Adult Health

## 2019-05-24 DIAGNOSIS — I10 Essential (primary) hypertension: Secondary | ICD-10-CM

## 2019-05-24 MED FILL — ABIRATERONE ACETATE 250 MG: 250 | 30 days supply | Qty: 120 | Fill #0

## 2019-05-25 NOTE — Telephone Encounter (Signed)
Sent to the pharmacy by e-scribe. 

## 2019-05-31 ENCOUNTER — Other Ambulatory Visit: Payer: Self-pay | Admitting: Adult Health

## 2019-05-31 DIAGNOSIS — N401 Enlarged prostate with lower urinary tract symptoms: Secondary | ICD-10-CM

## 2019-05-31 DIAGNOSIS — R3914 Feeling of incomplete bladder emptying: Secondary | ICD-10-CM

## 2019-05-31 NOTE — Telephone Encounter (Signed)
Sent to the pharmacy by e-scribe for 90 days.  Pt has upcoming cpx on 07/26/2019

## 2019-06-06 MED FILL — predniSONE 5 MG TABS: 5 | 90 days supply | Qty: 90 | Fill #2

## 2019-06-15 ENCOUNTER — Other Ambulatory Visit: Payer: Self-pay | Admitting: Adult Health

## 2019-06-15 DIAGNOSIS — E782 Mixed hyperlipidemia: Secondary | ICD-10-CM

## 2019-06-15 DIAGNOSIS — I10 Essential (primary) hypertension: Secondary | ICD-10-CM

## 2019-06-18 ENCOUNTER — Other Ambulatory Visit: Payer: Self-pay | Admitting: Oncology

## 2019-06-18 DIAGNOSIS — C61 Malignant neoplasm of prostate: Secondary | ICD-10-CM

## 2019-06-20 NOTE — Telephone Encounter (Signed)
Sent to the pharmacy by e-scribe.  Pt has upcoming cpx. 

## 2019-06-22 ENCOUNTER — Other Ambulatory Visit: Payer: Self-pay | Admitting: Adult Health

## 2019-06-22 DIAGNOSIS — E119 Type 2 diabetes mellitus without complications: Secondary | ICD-10-CM

## 2019-06-22 NOTE — Telephone Encounter (Signed)
Sent to the pharmacy by e-scribe.  Pt has upcoming cpx. 

## 2019-06-25 ENCOUNTER — Other Ambulatory Visit: Payer: Self-pay | Admitting: Adult Health

## 2019-06-25 DIAGNOSIS — I10 Essential (primary) hypertension: Secondary | ICD-10-CM

## 2019-06-26 ENCOUNTER — Other Ambulatory Visit: Payer: Self-pay

## 2019-06-26 DIAGNOSIS — C61 Malignant neoplasm of prostate: Secondary | ICD-10-CM

## 2019-06-26 NOTE — Telephone Encounter (Signed)
Sent to the pharmacy by e-scribe. 

## 2019-06-27 ENCOUNTER — Inpatient Hospital Stay: Payer: Medicare Other | Attending: Oncology | Admitting: Oncology

## 2019-06-27 ENCOUNTER — Other Ambulatory Visit: Payer: Self-pay

## 2019-06-27 ENCOUNTER — Inpatient Hospital Stay: Payer: Medicare Other

## 2019-06-27 VITALS — BP 124/65 | HR 73 | Temp 98.2°F | Resp 17 | Ht 67.0 in | Wt 209.9 lb

## 2019-06-27 DIAGNOSIS — E876 Hypokalemia: Secondary | ICD-10-CM | POA: Insufficient documentation

## 2019-06-27 DIAGNOSIS — I10 Essential (primary) hypertension: Secondary | ICD-10-CM | POA: Diagnosis not present

## 2019-06-27 DIAGNOSIS — C61 Malignant neoplasm of prostate: Secondary | ICD-10-CM | POA: Diagnosis not present

## 2019-06-27 DIAGNOSIS — Z79899 Other long term (current) drug therapy: Secondary | ICD-10-CM | POA: Insufficient documentation

## 2019-06-27 LAB — CMP (CANCER CENTER ONLY)
ALT: 16 U/L (ref 0–44)
AST: 16 U/L (ref 15–41)
Albumin: 3.6 g/dL (ref 3.5–5.0)
Alkaline Phosphatase: 60 U/L (ref 38–126)
Anion gap: 13 (ref 5–15)
BUN: 12 mg/dL (ref 8–23)
CO2: 26 mmol/L (ref 22–32)
Calcium: 9.3 mg/dL (ref 8.9–10.3)
Chloride: 104 mmol/L (ref 98–111)
Creatinine: 0.97 mg/dL (ref 0.61–1.24)
GFR, Est AFR Am: >60 mL/min (ref >60)
GFR, Estimated: >60 mL/min (ref >60)
Glucose, Bld: 157 mg/dL — ABNORMAL HIGH (ref 70–99)
Potassium: 3.4 mmol/L — ABNORMAL LOW (ref 3.5–5.1)
Sodium: 143 mmol/L (ref 135–145)
Total Bilirubin: 0.6 mg/dL (ref 0.3–1.2)
Total Protein: 6.1 g/dL — ABNORMAL LOW (ref 6.5–8.1)

## 2019-06-27 LAB — CBC WITH DIFFERENTIAL (CANCER CENTER ONLY)
Abs Immature Granulocytes: 0.01 10*3/uL (ref 0.00–0.07)
Basophils Absolute: 0 10*3/uL (ref 0.0–0.1)
Basophils Relative: 1 %
Eosinophils Absolute: 0.2 10*3/uL (ref 0.0–0.5)
Eosinophils Relative: 3 %
HCT: 32.3 % — ABNORMAL LOW (ref 39.0–52.0)
Hemoglobin: 10.6 g/dL — ABNORMAL LOW (ref 13.0–17.0)
Immature Granulocytes: 0 %
Lymphocytes Relative: 27 %
Lymphs Abs: 1.7 10*3/uL (ref 0.7–4.0)
MCH: 28 pg (ref 26.0–34.0)
MCHC: 32.8 g/dL (ref 30.0–36.0)
MCV: 85.2 fL (ref 80.0–100.0)
Monocytes Absolute: 0.4 10*3/uL (ref 0.1–1.0)
Monocytes Relative: 7 %
Neutro Abs: 3.9 10*3/uL (ref 1.7–7.7)
Neutrophils Relative %: 62 %
Platelet Count: 213 10*3/uL (ref 150–400)
RBC: 3.79 MIL/uL — ABNORMAL LOW (ref 4.22–5.81)
RDW: 14.1 % (ref 11.5–15.5)
WBC Count: 6.3 10*3/uL (ref 4.0–10.5)
nRBC: 0 % (ref 0.0–0.2)

## 2019-06-27 MED ORDER — HEPARIN SOD (PORK) LOCK FLUSH 100 UNIT/ML IV SOLN
500.0000 [IU] | Freq: Once | INTRAVENOUS | Status: AC | PRN
  Administered 2019-06-27: 500 [IU]
  Filled 2019-06-27: qty 5

## 2019-06-27 MED ORDER — SODIUM CHLORIDE 0.9% FLUSH
10.0000 mL | INTRAVENOUS | Status: DC | PRN
  Administered 2019-06-27: 10 mL
  Filled 2019-06-27: qty 10

## 2019-06-27 MED FILL — ABIRATERONE ACETATE 250 MG: 250 | 30 days supply | Qty: 120 | Fill #0

## 2019-06-27 NOTE — Progress Notes (Signed)
Hematology and Oncology Follow Up Visit  Lemarr Poch [id number] 10/25/1945 73 y.o. 06/27/2019 9:58 AM [name], [name], [name], NP   Principle Diagnosis: 73 year old man with advanced prostate cancer diagnosed in 2019.  He was found to have PSA of 1170 and a Gleason score 4+5 = 9 and lymphadenopathy.  He has castration-resistant disease.   Prior Therapy:  Status post prostate biopsy obtained on 06/06/2018 which showed high-volume disease with a Gleason score 4+5 = 9.  Taxotere chemotherapy at 75 mg/m every 3 weeks started on 07/07/2018.  He he is here for cycle 6 of therapy.  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  Current therapy:   Lupron 30 mg every 4 months.  Last injection given in September 2020 and will receive Eligard in January 2021.  Zytiga 1000 mg daily started in April 2020.  Interim History: Mr. Angelo is here for return evaluation.  Since the last visit, he reports feeling well without any complaints.  He continues to tolerate Zytiga without any issues or complications.  He denies excessive fatigue, tiredness.  He denies any worsening edema or bone pain.  Quality of life and performance status is unchanged.  He denied any alteration mental status, neuropathy, confusion or dizziness.  Denies any headaches or lethargy.  Denies any night sweats, weight loss or changes in appetite.  Denied orthopnea, dyspnea on exertion or chest discomfort.  Denies shortness of breath, difficulty breathing hemoptysis or cough.  Denies any abdominal distention, nausea, early satiety or dyspepsia.  Denies any hematuria, frequency, dysuria or nocturia.  Denies any skin irritation, dryness or rash.  Denies any ecchymosis or petechiae.  Denies any lymphadenopathy or clotting.  Denies any heat or cold intolerance.  Denies any anxiety or depression.  Remaining review of system is negative.                 Medications:  Without any changes on review. Current Outpatient Medications  Medication Sig Dispense Refill  . abiraterone acetate (ZYTIGA) 250 MG tablet TAKE 4 TABLETS (1,000 MG TOTAL) BY MOUTH DAILY. TAKE ON AN EMPTY STOMACH 1 HOUR BEFORE OR 2 HOURS AFTER A MEAL 120 tablet 0  . aspirin EC 81 MG tablet Take 81 mg by mouth daily.    Marland Kitchen atorvastatin (LIPITOR) 40 MG tablet TAKE 1 TABLET EVERY DAY 90 tablet 0  . cholecalciferol (VITAMIN D3) 25 MCG (1000 UT) tablet Take 1,000 Units by mouth daily.    . cloNIDine (CATAPRES) 0.2 MG tablet TAKE 2 TABLETS EVERY MORNING  AND TAKE 2 TABLETS EVERY EVENING 360 tablet 0  . doxazosin (CARDURA) 8 MG tablet TAKE 1 TABLET AT BEDTIME 90 tablet 0  . fosinopril (MONOPRIL) 40 MG tablet Take 2 tablets (80 mg total) by mouth daily. 200 tablet 3  . furosemide (LASIX) 40 MG tablet TAKE 2 TABLETS (80 MG TOTAL) BY MOUTH EVERY MORNING. 180 tablet 0  . glipiZIDE (GLUCOTROL XL) 10 MG 24 hr tablet TAKE 1 TABLET TWICE DAILY 180 tablet 0  . labetalol (NORMODYNE) 200 MG tablet TAKE 3 TABLETS EVERY MORNING  AND TAKE 2 TABLETS EVERY EVENING 450 tablet 0  . levothyroxine (SYNTHROID, LEVOTHROID) 150 MCG tablet TAKE 1 TABLET EVERY DAY 90 tablet 2  . lidocaine-prilocaine (EMLA) cream Apply 1 application topically as needed. 30 g 0  . metFORMIN (GLUCOPHAGE) 1000 MG tablet TAKE 1 TABLET (1,000 MG TOTAL) BY MOUTH 2 (TWO) TIMES DAILY WITH A MEAL. 180 tablet 0  .  NIFEdipine (PROCARDIA XL/NIFEDICAL XL) 60 MG 24 hr tablet TAKE 1 TABLET EVERY DAY 90 tablet 0  . potassium chloride SA (K-DUR) 20 MEQ tablet TAKE 1 TABLET TWICE DAILY 180 tablet 0  . predniSONE (DELTASONE) 5 MG tablet Take 1 tablet (5 mg total) by mouth daily with breakfast. 90 tablet 3  . sildenafil (VIAGRA) 50 MG tablet Take 1 tablet (50 mg total) by mouth daily as needed for erectile dysfunction. 10 tablet 3   No current facility-administered medications for this visit.    Facility-Administered Medications Ordered in Other Visits   Medication Dose Route Frequency Provider Last Rate Last Dose  . sodium chloride flush (NS) 0.9 % injection 10 mL  10 mL Intracatheter PRN Wyatt Portela, MD   10 mL at 06/27/19 [id number]     Allergies: No Known Allergies  Past Medical History, Surgical history, Social history, and Family History unchanged on review.    Physical Exam:   Blood pressure 124/65, pulse 73, temperature 98.2 F (36.8 C), temperature source Temporal, resp. rate 17, height 5\' 7"  (1.702 m), weight 209 lb 14.4 oz (95.2 kg), SpO2 99 %.      ECOG: 0    General appearance: Alert, awake without any distress. Head: Atraumatic without abnormalities Oropharynx: Without any thrush or ulcers. Eyes: No scleral icterus. Lymph nodes: No lymphadenopathy noted in the cervical, supraclavicular, or axillary nodes Heart:regular rate and rhythm, without any murmurs or gallops.   Lung: Clear to auscultation without any rhonchi, wheezes or dullness to percussion. Abdomin: Soft, nontender without any shifting dullness or ascites. Musculoskeletal: No clubbing or cyanosis. Neurological: No motor or sensory deficits. Skin: No rashes or lesions.          Lab Results: Lab Results  Component Value Date   WBC 6.4 04/27/2019   HGB 9.7 (L) 04/27/2019   HCT 31.0 (L) 04/27/2019   MCV 86.8 04/27/2019   PLT 227 04/27/2019     Chemistry      Component Value Date/Time   NA 141 04/27/2019 0910   NA 143 05/16/2018 1126   K 3.5 04/27/2019 0910   CL 104 04/27/2019 0910   CO2 25 04/27/2019 0910   BUN 15 04/27/2019 0910   BUN 7 (L) 05/16/2018 1126   CREATININE 0.88 04/27/2019 0910      Component Value Date/Time   CALCIUM 8.9 04/27/2019 0910   ALKPHOS 61 04/27/2019 0910   AST 22 04/27/2019 0910   ALT 25 04/27/2019 0910   BILITOT 0.6 04/27/2019 0910        Results for Heckman, MARQUISE CARITHERS (MRN [id number]) as of 06/27/2019 09:58  Ref. Range 04/27/2019 09:10  Prostate Specific Ag, Serum Latest Ref Range: 0.0 - 4.0  ng/mL <0.1          Impression and Plan:  73 year old man with:  1.  Castration hyper resistant prostate cancer with the adenopathy noted in 2019.    He continues to be on Zytiga without any major complications.  He has experienced excellent PSA response that is currently undetectable.  Risks and benefits of continuing this therapy and long-term complications were reiterated.  These include hypertension, edema and adrenal insufficiency.  Alternative options such as a return to chemotherapy would be reasonable if he developed worsening disease.  He is agreeable to continue at this time.  2.  IV access: Port-A-Cath will continue to be flushed periodically.  3.  Androgen deprivation: He will receive Eligard with the next visit in January 2020.  Long-term complication "  hot flashes and weight gain were reiterated.  5.  Goals of care and prognosis: His disease is incurable but aggressive measures are warranted given his excellent performance status.  6.  Hypokalemia: Last potassium has been corrected although we need to monitor on Zytiga.  7.  Hypertension: No issues with blood pressure noted at this time we will continue to monitor on Zytiga.  8.  Follow-up: He will return in 2 months for a follow-up and a Eligard injection.  25  minutes was spent with the patient face-to-face today.  More than 50% of time was spent on reviewing his disease status, treatment options and answering questions regarding future plan of care.      [name], MD 11/4/20209:58 AM

## 2019-06-27 NOTE — Patient Instructions (Signed)

## 2019-06-28 ENCOUNTER — Telehealth: Payer: Self-pay | Admitting: Oncology

## 2019-06-28 LAB — PROSTATE-SPECIFIC AG, SERUM (LABCORP): Prostate Specific Ag, Serum: <0.1 ng/mL (ref 0.0–4.0)

## 2019-06-28 NOTE — Telephone Encounter (Signed)
Scheduled appt per 11/4 los.  Sent a staff message to get a calendar mailed out.

## 2019-07-06 ENCOUNTER — Other Ambulatory Visit: Payer: Self-pay | Admitting: Adult Health

## 2019-07-11 NOTE — Telephone Encounter (Signed)
Sent to the pharmacy by e-scribe for 90 days.  Pt has upcoming cpx on 07/26/2019

## 2019-07-20 ENCOUNTER — Other Ambulatory Visit: Payer: Self-pay | Admitting: Oncology

## 2019-07-20 DIAGNOSIS — C61 Malignant neoplasm of prostate: Secondary | ICD-10-CM

## 2019-07-25 MED FILL — ABIRATERONE ACETATE 250 MG: 250 | 30 days supply | Qty: 120 | Fill #0

## 2019-07-26 ENCOUNTER — Other Ambulatory Visit: Payer: Self-pay

## 2019-07-26 ENCOUNTER — Ambulatory Visit (INDEPENDENT_AMBULATORY_CARE_PROVIDER_SITE_OTHER): Payer: Medicare Other | Admitting: Adult Health

## 2019-07-26 ENCOUNTER — Encounter: Payer: Self-pay | Admitting: Adult Health

## 2019-07-26 VITALS — BP 128/72 | Temp 97.4°F | Ht 67.0 in | Wt 211.0 lb

## 2019-07-26 DIAGNOSIS — I10 Essential (primary) hypertension: Secondary | ICD-10-CM | POA: Diagnosis not present

## 2019-07-26 DIAGNOSIS — E039 Hypothyroidism, unspecified: Secondary | ICD-10-CM

## 2019-07-26 DIAGNOSIS — E119 Type 2 diabetes mellitus without complications: Secondary | ICD-10-CM

## 2019-07-26 DIAGNOSIS — Z23 Encounter for immunization: Secondary | ICD-10-CM

## 2019-07-26 DIAGNOSIS — E782 Mixed hyperlipidemia: Secondary | ICD-10-CM

## 2019-07-26 DIAGNOSIS — R600 Localized edema: Secondary | ICD-10-CM

## 2019-07-26 DIAGNOSIS — C61 Malignant neoplasm of prostate: Secondary | ICD-10-CM

## 2019-07-26 LAB — COMPREHENSIVE METABOLIC PANEL
ALT: 15 U/L (ref 0–53)
AST: 16 U/L (ref 0–37)
Albumin: 4 g/dL (ref 3.5–5.2)
Alkaline Phosphatase: 59 U/L (ref 39–117)
BUN: 18 mg/dL (ref 6–23)
CO2: 31 mEq/L (ref 19–32)
Calcium: 9.2 mg/dL (ref 8.4–10.5)
Chloride: 102 mEq/L (ref 96–112)
Creatinine, Ser: 1.06 mg/dL (ref 0.40–1.50)
GFR: 83.11 mL/min (ref >60.00)
Glucose, Bld: 124 mg/dL — ABNORMAL HIGH (ref 70–99)
Potassium: 3.9 mEq/L (ref 3.5–5.1)
Sodium: 144 mEq/L (ref 135–145)
Total Bilirubin: 0.7 mg/dL (ref 0.2–1.2)
Total Protein: 5.9 g/dL — ABNORMAL LOW (ref 6.0–8.3)

## 2019-07-26 LAB — CBC WITH DIFFERENTIAL/PLATELET
Basophils Absolute: 0.1 10*3/uL (ref 0.0–0.1)
Basophils Relative: 1.2 % (ref 0.0–3.0)
Eosinophils Absolute: 0.2 10*3/uL (ref 0.0–0.7)
Eosinophils Relative: 3.6 % (ref 0.0–5.0)
HCT: 33.2 % — ABNORMAL LOW (ref 39.0–52.0)
Hemoglobin: 11 g/dL — ABNORMAL LOW (ref 13.0–17.0)
Lymphocytes Relative: 36.2 % (ref 12.0–46.0)
Lymphs Abs: 2 10*3/uL (ref 0.7–4.0)
MCHC: 33.1 g/dL (ref 30.0–36.0)
MCV: 83.1 fl (ref 78.0–100.0)
Monocytes Absolute: 0.4 10*3/uL (ref 0.1–1.0)
Monocytes Relative: 8.2 % (ref 3.0–12.0)
Neutro Abs: 2.8 10*3/uL (ref 1.4–7.7)
Neutrophils Relative %: 50.8 % (ref 43.0–77.0)
Platelets: 217 10*3/uL (ref 150.0–400.0)
RBC: 3.99 Mil/uL — ABNORMAL LOW (ref 4.22–5.81)
RDW: 14.8 % (ref 11.5–15.5)
WBC: 5.4 10*3/uL (ref 4.0–10.5)

## 2019-07-26 LAB — LIPID PANEL
Cholesterol: 154 mg/dL (ref 0–200)
HDL: 48.2 mg/dL (ref >39.00)
LDL Cholesterol: 83 mg/dL (ref 0–99)
NonHDL: 105.44
Total CHOL/HDL Ratio: 3
Triglycerides: 113 mg/dL (ref 0.0–149.0)
VLDL: 22.6 mg/dL (ref 0.0–40.0)

## 2019-07-26 LAB — TSH: TSH: 1.92 u[IU]/mL (ref 0.35–4.50)

## 2019-07-26 LAB — HEMOGLOBIN A1C: Hgb A1c MFr Bld: 7.6 % — ABNORMAL HIGH (ref 4.6–6.5)

## 2019-07-26 NOTE — Progress Notes (Signed)
Subjective:    Patient ID: Matthew Mcardle., male    DOB: [birth year], 73 y.o.   MRN: [id number]  HPI Patient presents for yearly preventative medicine examination. He is a pleasant 73 year old male who  has a past medical history of Aortic stenosis, DIABETES MELLITUS, TYPE II (07/06/2007), ED (erectile dysfunction), Elevated PSA (01/20/2012), Elevated PSA, Heart murmur, History of nuclear stress test, HYPERLIPIDEMIA (07/06/2007), HYPERTENSION (07/06/2007), HYPOTHYROIDISM (07/06/2007), Leg pain, and Obesity.  Hyperlipidemia -currently prescribed Lipitor 40 mg and aspirin 81 mg.  He denies myalgia or fatigue  Lab Results  Component Value Date   CHOL 165 05/24/2018   HDL 67.80 05/24/2018   LDLCALC 74 05/24/2018   LDLDIRECT 105.0 04/28/2016   TRIG 116.0 05/24/2018   CHOLHDL 2 05/24/2018    Diabetes Type II-currently prescribed glipizide 10 mg extended release and metformin 1000 mg twice daily.  Denies hypoglycemic episodes.  He has been monitoring his blood sugars at home and reports readings in the 110s to 160s.  The highest reading he has seen was 225, this was after eating ice cream and oatmeal cookies. Lab Results  Component Value Date   HGBA1C 7.5 (H) 04/25/2019   Hypertension-currently prescribed clonidine 0.2 mg, Procardia 40 mg, Lasix 40 mg, labetalol 600 mg in the morning and 400 mg in the evening,, Monopril 80 mg  BP Readings from Last 3 Encounters:  07/26/19 128/72  06/27/19 124/65  04/27/19 109/75   Hypothyroidism -takes Synthroid 150 mg daily  H/o prostate CA-advanced prostate cancer that was diagnosed in 2019 he was found to have a PSA of 1170 and a Gleason score of 9.  He is followed by oncology current therapy includes Lupron 30 mg every 4 months and Zytiga 1000 mg daily.  His PSA is currently undetectable.  Unilateral lower extremity edema -has been present for over a year.  Was seen by cardiology and September 2019 when this first started.  Ultrasound was negative  for DVT at this time.  Has had a echo which showed EF of 55 to 60%.  Swelling decreases slightly when he is supine.  He is taking Lasix as directed.  Denies chest pain or shortness of breath and has not experienced any tenderness in his left calf.  All immunizations and health maintenance protocols were reviewed with the patient and needed orders were placed. Due for influenza vaccination   Appropriate screening laboratory values were ordered for the patient including screening of hyperlipidemia, renal function and hepatic function.  Medication reconciliation,  past medical history, social history, problem list and allergies were reviewed in detail with the patient  Goals were established with regard to weight loss, exercise, and  diet in compliance with medications  End of life planning was discussed.   Review of Systems  Constitutional: Negative.   HENT: Negative.   Eyes: Negative.   Respiratory: Negative.   Cardiovascular: Positive for leg swelling.  Gastrointestinal: Negative.   Endocrine: Negative.   Genitourinary: Negative.   Musculoskeletal: Negative.   Skin: Negative.   Allergic/Immunologic: Negative.   Neurological: Negative.   Hematological: Negative.   Psychiatric/Behavioral: Negative.   All other systems reviewed and are negative.  Past Medical History:  Diagnosis Date   Aortic stenosis    Echo 8/18: mild LVH, EF 60-65, no RWMA, mild AS (mean 10, peak 19), MAC, trivial MR, normal RVSF  // Echo 01/2019: EF 60-65, Gr 1 DD, mod AS (mean 22, peak 39)    DIABETES MELLITUS, TYPE II 07/06/2007  ED (erectile dysfunction)    Elevated PSA 01/20/2012   Elevated PSA    Heart murmur    History of nuclear stress test    Myoview 8/18: EF 64, inferobasal thinning, no ischemia, low risk   HYPERLIPIDEMIA 07/06/2007   HYPERTENSION 07/06/2007   HYPOTHYROIDISM 07/06/2007   Leg pain    ABIs 8/18:  Normal    Obesity     Social History   Socioeconomic History    Marital status: Widowed    Spouse name: Not on file   Number of children: Not on file   Years of education: Not on file   Highest education level: Not on file  Occupational History   Not on file  Social Needs   Financial resource strain: Not on file   Food insecurity    Worry: Not on file    Inability: Not on file   Transportation needs    Medical: Not on file    Non-medical: Not on file  Tobacco Use   Smoking status: Former Smoker   Smokeless tobacco: Never Used   Tobacco comment: quit 2005  Substance and Sexual Activity   Alcohol use: Yes    Alcohol/week: 21.0 standard drinks    Types: 21 Cans of beer per week   Drug use: No   Sexual activity: Not on file  Lifestyle   Physical activity    Days per week: Not on file    Minutes per session: Not on file   Stress: Not on file  Relationships   Social connections    Talks on phone: Not on file    Gets together: Not on file    Attends religious service: Not on file    Active member of club or organization: Not on file    Attends meetings of clubs or organizations: Not on file    Relationship status: Not on file   Intimate partner violence    Fear of current or ex partner: Not on file    Emotionally abused: Not on file    Physically abused: Not on file    Forced sexual activity: Not on file  Other Topics Concern   Not on file  Social History Narrative   He works as a Freight forwarder    Not married    No kids           Past Surgical History:  Procedure Laterality Date   COLONOSCOPY     COLONOSCOPY W/ BIOPSIES AND POLYPECTOMY     IR IMAGING GUIDED PORT INSERTION  07/05/2018   POLYPECTOMY      Family History  Problem Relation Age of Onset   Hypertension Other    Breast cancer Sister    Breast cancer Unknown        family history   Breast cancer Brother        prostate ca   Aneurysm Mother        Brain aneurysm    Prostate cancer Father    Colon cancer Neg Hx     No Known  Allergies  Current Outpatient Medications on File Prior to Visit  Medication Sig Dispense Refill   abiraterone acetate (ZYTIGA) 250 MG tablet TAKE 4 TABLETS (1,000 MG TOTAL) BY MOUTH DAILY. TAKE ON AN EMPTY STOMACH 1 HOUR BEFORE OR 2 HOURS AFTER A MEAL 120 tablet 0   aspirin EC 81 MG tablet Take 81 mg by mouth daily.     atorvastatin (LIPITOR) 40 MG tablet TAKE 1 TABLET EVERY DAY  90 tablet 0   cholecalciferol (VITAMIN D3) 25 MCG (1000 UT) tablet Take 1,000 Units by mouth daily.     cloNIDine (CATAPRES) 0.2 MG tablet TAKE 2 TABLETS EVERY MORNING  AND TAKE 2 TABLETS EVERY EVENING 360 tablet 0   doxazosin (CARDURA) 8 MG tablet TAKE 1 TABLET AT BEDTIME 90 tablet 0   fosinopril (MONOPRIL) 40 MG tablet Take 2 tablets (80 mg total) by mouth daily. 200 tablet 3   furosemide (LASIX) 40 MG tablet TAKE 2 TABLETS (80 MG TOTAL) BY MOUTH EVERY MORNING. 180 tablet 0   glipiZIDE (GLUCOTROL XL) 10 MG 24 hr tablet TAKE 1 TABLET TWICE DAILY 180 tablet 0   labetalol (NORMODYNE) 200 MG tablet TAKE 3 TABLETS EVERY MORNING  AND TAKE 2 TABLETS EVERY EVENING 450 tablet 0   levothyroxine (SYNTHROID, LEVOTHROID) 150 MCG tablet TAKE 1 TABLET EVERY DAY 90 tablet 2   lidocaine-prilocaine (EMLA) cream Apply 1 application topically as needed. 30 g 0   metFORMIN (GLUCOPHAGE) 1000 MG tablet TAKE 1 TABLET TWICE DAILY WITH MEALS 180 tablet 0   NIFEdipine (PROCARDIA XL/NIFEDICAL XL) 60 MG 24 hr tablet TAKE 1 TABLET EVERY DAY 90 tablet 0   potassium chloride SA (K-DUR) 20 MEQ tablet TAKE 1 TABLET TWICE DAILY 180 tablet 0   predniSONE (DELTASONE) 5 MG tablet Take 1 tablet (5 mg total) by mouth daily with breakfast. 90 tablet 3   sildenafil (VIAGRA) 50 MG tablet Take 1 tablet (50 mg total) by mouth daily as needed for erectile dysfunction. 10 tablet 3   No current facility-administered medications on file prior to visit.     BP 128/72    Temp (!) 97.4 F (36.3 C) (Temporal)    Ht _0  (1.702 m)    Wt 211 lb  (95.7 kg)    BMI 33.05 kg/m       Objective:   Physical Exam Constitutional:      General: He is not in acute distress.    Appearance: Normal appearance. He is well-developed. He is obese.  HENT:     Head: Normocephalic and atraumatic.     Right Ear: Tympanic membrane, ear canal and external ear normal. There is no impacted cerumen.     Left Ear: Tympanic membrane, ear canal and external ear normal. There is no impacted cerumen.     Nose: Nose normal. No congestion or rhinorrhea.     Mouth/Throat:     Mouth: Mucous membranes are moist.     Pharynx: Oropharynx is clear. No oropharyngeal exudate or posterior oropharyngeal erythema.  Eyes:     General: No scleral icterus.       Right eye: No discharge.        Left eye: No discharge.     Extraocular Movements: Extraocular movements intact.     Conjunctiva/sclera: Conjunctivae normal.     Pupils: Pupils are equal, round, and reactive to light.  Neck:     Trachea: No tracheal deviation.  Cardiovascular:     Rate and Rhythm: Normal rate.     Pulses: Normal pulses.     Heart sounds: Murmur present. No friction rub. No gallop.   Pulmonary:     Effort: Pulmonary effort is normal. No respiratory distress.     Breath sounds: Normal breath sounds. No stridor. No wheezing, rhonchi or rales.  Chest:     Chest wall: No tenderness.  Abdominal:     General: Bowel sounds are normal. There is no distension.  Palpations: Abdomen is soft. There is no mass.     Tenderness: There is no abdominal tenderness. There is no right CVA tenderness, left CVA tenderness, guarding or rebound.     Hernia: No hernia is present.  Musculoskeletal: Normal range of motion.        General: No swelling, tenderness, deformity or signs of injury.     Left lower leg: 2+ Edema present.  Lymphadenopathy:     Cervical: No cervical adenopathy.  Skin:    General: Skin is warm and dry.     Capillary Refill: Capillary refill takes less than 2 seconds.     Coloration:  Skin is not jaundiced or pale.     Findings: No bruising, erythema, lesion or rash.  Neurological:     General: No focal deficit present.     Mental Status: He is alert and oriented to person, place, and time. Mental status is at baseline.     Cranial Nerves: No cranial nerve deficit.     Sensory: No sensory deficit.     Motor: No weakness.     Coordination: Coordination normal.     Gait: Gait normal.     Deep Tendon Reflexes: Reflexes normal.  Psychiatric:        Mood and Affect: Mood normal.        Behavior: Behavior normal.        Thought Content: Thought content normal.        Judgment: Judgment normal.       Assessment & Plan:  1. Type 2 diabetes mellitus without complication, without long-term current use of insulin (HCC) - Consider adding agent - Encouraged weight loss through diet and exercise - Follow up in three months  - CBC with Differential/Platelet - CMP - Lipid panel - TSH - Hemoglobin A1c - Ambulatory referral to Vascular Surgery  2. Essential hypertension - Well controlled. No change in medications  - CBC with Differential/Platelet - CMP - Lipid panel - TSH - Hemoglobin A1c - Ambulatory referral to Vascular Surgery  3. Mixed hyperlipidemia - Consider increase in statin  - CBC with Differential/Platelet - CMP - Lipid panel - TSH - Hemoglobin A1c - Ambulatory referral to Vascular Surgery  4. Hypothyroidism, unspecified type - Consider increase in synthroid  - CBC with Differential/Platelet - CMP - Lipid panel - TSH - Hemoglobin A1c - Ambulatory referral to Vascular Surgery  5. Malignant neoplasm of prostate ([place]) - Follow up with oncology as directed  6. Unilateral edema of lower extremity  - Ambulatory referral to Vascular Surgery  7. Need for prophylactic vaccination and inoculation against influenza  - Flu Vaccine QUAD High Dose(Fluad)  [name], NP

## 2019-07-26 NOTE — Patient Instructions (Signed)
It was great seeing you today   I will follow up with you regarding your labs   Someone will call you from vein and vascular to find out why your left leg is swelling   Please let me know if you need anything

## 2019-07-27 ENCOUNTER — Other Ambulatory Visit: Payer: Self-pay | Admitting: Family Medicine

## 2019-07-27 MED ORDER — PIOGLITAZONE HCL 15 MG PO TABS: 15.0000 mg | ORAL_TABLET | Freq: Every day | ORAL | 0 refills | Status: DC

## 2019-07-31 ENCOUNTER — Other Ambulatory Visit: Payer: Self-pay | Admitting: Adult Health

## 2019-07-31 DIAGNOSIS — N401 Enlarged prostate with lower urinary tract symptoms: Secondary | ICD-10-CM

## 2019-07-31 DIAGNOSIS — R3914 Feeling of incomplete bladder emptying: Secondary | ICD-10-CM

## 2019-07-31 DIAGNOSIS — I10 Essential (primary) hypertension: Secondary | ICD-10-CM

## 2019-08-01 NOTE — Telephone Encounter (Signed)
Sent to the pharmacy by e-scribe. 

## 2019-08-13 ENCOUNTER — Other Ambulatory Visit: Payer: Self-pay | Admitting: Adult Health

## 2019-08-13 DIAGNOSIS — I10 Essential (primary) hypertension: Secondary | ICD-10-CM

## 2019-08-15 NOTE — Telephone Encounter (Signed)
Sent to the pharmacy by e-scribe. 

## 2019-08-16 ENCOUNTER — Other Ambulatory Visit: Payer: Self-pay | Admitting: Adult Health

## 2019-08-16 DIAGNOSIS — I10 Essential (primary) hypertension: Secondary | ICD-10-CM

## 2019-08-16 NOTE — Telephone Encounter (Signed)
Sent to the pharmacy by e-scribe. 

## 2019-08-20 ENCOUNTER — Other Ambulatory Visit: Payer: Self-pay | Admitting: Oncology

## 2019-08-20 DIAGNOSIS — C61 Malignant neoplasm of prostate: Secondary | ICD-10-CM

## 2019-08-27 MED FILL — ABIRATERONE ACETATE 250 MG: 250 | 30 days supply | Qty: 120 | Fill #0

## 2019-08-29 ENCOUNTER — Telehealth: Payer: Self-pay | Admitting: Oncology

## 2019-08-29 ENCOUNTER — Inpatient Hospital Stay: Payer: Medicare Other

## 2019-08-29 ENCOUNTER — Inpatient Hospital Stay: Payer: Medicare Other | Attending: Oncology

## 2019-08-29 ENCOUNTER — Other Ambulatory Visit: Payer: Self-pay | Admitting: Adult Health

## 2019-08-29 ENCOUNTER — Inpatient Hospital Stay (HOSPITAL_BASED_OUTPATIENT_CLINIC_OR_DEPARTMENT_OTHER): Payer: Medicare Other | Admitting: Oncology

## 2019-08-29 ENCOUNTER — Other Ambulatory Visit: Payer: Self-pay

## 2019-08-29 VITALS — BP 126/75 | HR 76 | Temp 97.0°F | Resp 18 | Ht 67.0 in | Wt 219.1 lb

## 2019-08-29 DIAGNOSIS — Z79899 Other long term (current) drug therapy: Secondary | ICD-10-CM | POA: Diagnosis not present

## 2019-08-29 DIAGNOSIS — C61 Malignant neoplasm of prostate: Secondary | ICD-10-CM | POA: Diagnosis not present

## 2019-08-29 DIAGNOSIS — I10 Essential (primary) hypertension: Secondary | ICD-10-CM

## 2019-08-29 LAB — CBC WITH DIFFERENTIAL (CANCER CENTER ONLY)
Abs Immature Granulocytes: 0.02 10*3/uL (ref 0.00–0.07)
Basophils Absolute: 0 10*3/uL (ref 0.0–0.1)
Basophils Relative: 1 %
Eosinophils Absolute: 0.2 10*3/uL (ref 0.0–0.5)
Eosinophils Relative: 3 %
HCT: 30.6 % — ABNORMAL LOW (ref 39.0–52.0)
Hemoglobin: 9.8 g/dL — ABNORMAL LOW (ref 13.0–17.0)
Immature Granulocytes: 0 %
Lymphocytes Relative: 28 %
Lymphs Abs: 1.8 10*3/uL (ref 0.7–4.0)
MCH: 27.5 pg (ref 26.0–34.0)
MCHC: 32 g/dL (ref 30.0–36.0)
MCV: 85.7 fL (ref 80.0–100.0)
Monocytes Absolute: 0.5 10*3/uL (ref 0.1–1.0)
Monocytes Relative: 8 %
Neutro Abs: 3.9 10*3/uL (ref 1.7–7.7)
Neutrophils Relative %: 60 %
Platelet Count: 195 10*3/uL (ref 150–400)
RBC: 3.57 MIL/uL — ABNORMAL LOW (ref 4.22–5.81)
RDW: 15.7 % — ABNORMAL HIGH (ref 11.5–15.5)
WBC Count: 6.5 10*3/uL (ref 4.0–10.5)
nRBC: 0 % (ref 0.0–0.2)

## 2019-08-29 LAB — CMP (CANCER CENTER ONLY)
ALT: 20 U/L (ref 0–44)
AST: 19 U/L (ref 15–41)
Albumin: 3.8 g/dL (ref 3.5–5.0)
Alkaline Phosphatase: 54 U/L (ref 38–126)
Anion gap: 12 (ref 5–15)
BUN: 15 mg/dL (ref 8–23)
CO2: 24 mmol/L (ref 22–32)
Calcium: 8.5 mg/dL — ABNORMAL LOW (ref 8.9–10.3)
Chloride: 105 mmol/L (ref 98–111)
Creatinine: 0.86 mg/dL (ref 0.61–1.24)
GFR, Est AFR Am: >60 mL/min (ref >60)
GFR, Estimated: >60 mL/min (ref >60)
Glucose, Bld: 145 mg/dL — ABNORMAL HIGH (ref 70–99)
Potassium: 3.6 mmol/L (ref 3.5–5.1)
Sodium: 141 mmol/L (ref 135–145)
Total Bilirubin: 0.6 mg/dL (ref 0.3–1.2)
Total Protein: 6.2 g/dL — ABNORMAL LOW (ref 6.5–8.1)

## 2019-08-29 MED ORDER — SODIUM CHLORIDE 0.9% FLUSH
10.0000 mL | INTRAVENOUS | Status: DC | PRN
  Administered 2019-08-29: 09:00:00 10 mL
  Filled 2019-08-29: qty 10

## 2019-08-29 MED ORDER — LEUPROLIDE ACETATE (4 MONTH) 30 MG ~~LOC~~ KIT
30.0000 mg | PACK | Freq: Once | SUBCUTANEOUS | Status: AC
  Administered 2019-08-29: 30 mg via SUBCUTANEOUS
  Filled 2019-08-29: qty 30

## 2019-08-29 MED ORDER — HEPARIN SOD (PORK) LOCK FLUSH 100 UNIT/ML IV SOLN
500.0000 [IU] | Freq: Once | INTRAVENOUS | Status: AC | PRN
  Administered 2019-08-29: 500 [IU]
  Filled 2019-08-29: qty 5

## 2019-08-29 MED ORDER — FOSINOPRIL SODIUM 40 MG PO TABS: 80.0000 mg | ORAL_TABLET | Freq: Every day | ORAL | 1 refills | Status: DC

## 2019-08-29 NOTE — Telephone Encounter (Signed)
Scheduled appt per 1/6 los, sent a message to HIM pool to get a calendar mailed out.

## 2019-08-29 NOTE — Progress Notes (Signed)
Hematology and Oncology Follow Up Visit  Dair Necessary [id number] 1945/10/29 74 y.o. 08/29/2019 9:10 AM [name], [name], [name], NP   Principle Diagnosis: 74 year old man with castration-resistant prostate cancer with disease diagnosed in 2019.  He presented with PSA of 1170 and a Gleason score 4+5 = 9.   Prior Therapy:  Status post prostate biopsy obtained on 06/06/2018 which showed high-volume disease with a Gleason score 4+5 = 9.  Taxotere chemotherapy at 75 mg/m every 3 weeks started on 07/07/2018.  He he is here for cycle 6 of therapy.  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  Current therapy:   Lupron 30 mg every 4 months.  Last injection given in September 2020 and will receive Eligard in January 2021.  Zytiga 1000 mg daily started in April 2020.  Interim History: Mr. Matthew Serrano is here for a follow-up visit.  Since the last visit, he reports no major changes in his health.  He continues to tolerate Zytiga without any recent complaints.  He denies any nausea, vomiting or abdominal pain.  He denies any worsening neuropathy or edema.  He does report lower extremity swelling on the left which is chronic in nature but it is more than the right.  He is seen a vascular specialist in the near future.                   Medications: Unchanged on review. Current Outpatient Medications  Medication Sig Dispense Refill  . abiraterone acetate (ZYTIGA) 250 MG tablet TAKE 4 TABLETS (1,000 MG TOTAL) BY MOUTH DAILY. TAKE ON AN EMPTY STOMACH 1 HOUR BEFORE OR 2 HOURS AFTER A MEAL 120 tablet 0  . aspirin EC 81 MG tablet Take 81 mg by mouth daily.    Marland Kitchen atorvastatin (LIPITOR) 40 MG tablet TAKE 1 TABLET EVERY DAY 90 tablet 0  . cholecalciferol (VITAMIN D3) 25 MCG (1000 UT) tablet Take 1,000 Units by mouth daily.    . cloNIDine (CATAPRES) 0.2 MG tablet TAKE 2 TABLETS EVERY MORNING  AND TAKE 2 TABLETS EVERY EVENING 360 tablet  3  . doxazosin (CARDURA) 8 MG tablet TAKE 1 TABLET AT BEDTIME 90 tablet 3  . fosinopril (MONOPRIL) 40 MG tablet Take 2 tablets (80 mg total) by mouth daily. 200 tablet 3  . furosemide (LASIX) 40 MG tablet TAKE 2 TABLETS (80 MG TOTAL) BY MOUTH EVERY MORNING. 180 tablet 3  . glipiZIDE (GLUCOTROL XL) 10 MG 24 hr tablet TAKE 1 TABLET TWICE DAILY 180 tablet 0  . labetalol (NORMODYNE) 200 MG tablet TAKE 3 TABLETS EVERY MORNING  AND TAKE 2 TABLETS EVERY EVENING 450 tablet 0  . levothyroxine (SYNTHROID) 150 MCG tablet TAKE 1 TABLET EVERY DAY 90 tablet 3  . lidocaine-prilocaine (EMLA) cream Apply 1 application topically as needed. 30 g 0  . metFORMIN (GLUCOPHAGE) 1000 MG tablet TAKE 1 TABLET TWICE DAILY WITH MEALS 180 tablet 0  . NIFEdipine (PROCARDIA XL/NIFEDICAL XL) 60 MG 24 hr tablet TAKE 1 TABLET EVERY DAY 90 tablet 0  . pioglitazone (ACTOS) 15 MG tablet Take 1 tablet (15 mg total) by mouth daily. 90 tablet 0  . potassium chloride SA (KLOR-CON) 20 MEQ tablet TAKE 1 TABLET TWICE DAILY 180 tablet 3  . predniSONE (DELTASONE) 5 MG tablet Take 1 tablet (5 mg total) by mouth daily with breakfast. 90 tablet 3  . sildenafil (VIAGRA) 50 MG tablet Take 1 tablet (50 mg total) by mouth daily as needed  for erectile dysfunction. 10 tablet 3   No current facility-administered medications for this visit.   Facility-Administered Medications Ordered in Other Visits  Medication Dose Route Frequency Provider Last Rate Last Admin  . sodium chloride flush (NS) 0.9 % injection 10 mL  10 mL Intracatheter PRN Wyatt Portela, MD   10 mL at 08/29/19 0902     Allergies: No Known Allergies  Past Medical History, Surgical history, Social history, and Family History updated without any changes..    Physical Exam:        ECOG: 0    General appearance: Comfortable appearing without any discomfort Head: Normocephalic without any trauma Oropharynx: Mucous membranes are moist and pink without any thrush or  ulcers. Eyes: Pupils are equal and round reactive to light. Lymph nodes: No cervical, supraclavicular, inguinal or axillary lymphadenopathy.   Heart:regular rate and rhythm.  S1 and S2.  Chronic left leg edema noted. Lung: Clear without any rhonchi or wheezes.  No dullness to percussion. Abdomin: Soft, nontender, nondistended with good bowel sounds.  No hepatosplenomegaly. Musculoskeletal: No joint deformity or effusion.  Full range of motion noted. Neurological: No deficits noted on motor, sensory and deep tendon reflex exam. Skin: No petechial rash or dryness.  Appeared moist.            Lab Results: Lab Results  Component Value Date   WBC 5.4 07/26/2019   HGB 11.0 (L) 07/26/2019   HCT 33.2 (L) 07/26/2019   MCV 83.1 07/26/2019   PLT 217.0 07/26/2019     Chemistry      Component Value Date/Time   NA 144 07/26/2019 0921   NA 143 05/16/2018 1126   K 3.9 07/26/2019 0921   CL 102 07/26/2019 0921   CO2 31 07/26/2019 0921   BUN 18 07/26/2019 0921   BUN 7 (L) 05/16/2018 1126   CREATININE 1.06 07/26/2019 0921   CREATININE 0.97 06/27/2019 0951      Component Value Date/Time   CALCIUM 9.2 07/26/2019 0921   ALKPHOS 59 07/26/2019 0921   AST 16 07/26/2019 0921   AST 16 06/27/2019 0951   ALT 15 07/26/2019 0921   ALT 16 06/27/2019 0951   BILITOT 0.7 07/26/2019 0921   BILITOT 0.6 06/27/2019 0951         Results for Thoreson, MATEJ SPILLER (MRN [id number]) as of 08/29/2019 09:11  Ref. Range 04/27/2019 09:10 06/27/2019 09:51  Prostate Specific Ag, Serum Latest Ref Range: 0.0 - 4.0 ng/mL <0.1 <0.1          Impression and Plan:  74 year old man with:  1.  Advanced prostate cancer that is currently castration-resistant with a pelvic adenopathy since 2019.    He continues to have excellent PSA response on Zytiga that is currently undetectable.  Risks and benefits of continuing this therapy was discussed today.  Potential complications including hypertension as well as  adrenal insufficiency were reviewed.  The plan is to continue with his current therapy and use alternative options if he develops advanced disease.  He is agreeable to continue.  2.  IV access: Port-A-Cath remains in place and will be flushed periodically.  3.  Androgen deprivation: He will receive Eligard today and repeated in 4 months.  5.  Goals of care and prognosis: Therapy remains palliative at this time with his cancer being incurable.  Aggressive measures or warranted given his excellent performance status.  6.  Hypokalemia: Resolved at this time and will continue to be monitored on Zytiga.  7.  Hypertension: Blood pressure close to normal range at this time.  8.  Follow-up: In 4 months for repeat evaluation and Eligard  25  minutes was dedicated to this encounter.  This time was spent on reviewing laboratory data, discussing treatment complications, treatment options for the future as well as answering questions regarding prognosis and goals of care.     [name], MD 1/6/20219:10 AM

## 2019-08-29 NOTE — Patient Instructions (Signed)

## 2019-08-29 NOTE — Telephone Encounter (Signed)
fosinopril (MONOPRIL) 40 MG tablet     Patient requesting refill.     Pharmacy:  [id number] [place] Phone:  [phone]  Fax:  [phone]

## 2019-08-30 LAB — PROSTATE-SPECIFIC AG, SERUM (LABCORP): Prostate Specific Ag, Serum: <0.1 ng/mL (ref 0.0–4.0)

## 2019-08-31 ENCOUNTER — Other Ambulatory Visit: Payer: Self-pay | Admitting: Adult Health

## 2019-08-31 DIAGNOSIS — E119 Type 2 diabetes mellitus without complications: Secondary | ICD-10-CM

## 2019-08-31 DIAGNOSIS — I10 Essential (primary) hypertension: Secondary | ICD-10-CM

## 2019-08-31 DIAGNOSIS — E782 Mixed hyperlipidemia: Secondary | ICD-10-CM

## 2019-09-03 MED FILL — predniSONE 5 MG TABS: 5 | 90 days supply | Qty: 90 | Fill #3

## 2019-09-04 ENCOUNTER — Other Ambulatory Visit: Payer: Self-pay

## 2019-09-04 ENCOUNTER — Encounter: Payer: Self-pay | Admitting: Vascular Surgery

## 2019-09-04 ENCOUNTER — Ambulatory Visit (INDEPENDENT_AMBULATORY_CARE_PROVIDER_SITE_OTHER): Payer: Medicare Other | Admitting: Vascular Surgery

## 2019-09-04 ENCOUNTER — Ambulatory Visit (HOSPITAL_COMMUNITY)
Admission: RE | Admit: 2019-09-04 | Discharge: 2019-09-04 | Disposition: A | Discharge status: Home / Self Care | Payer: Medicare Other | Source: Ambulatory Visit | Attending: Vascular Surgery | Admitting: Vascular Surgery

## 2019-09-04 VITALS — BP 120/72 | HR 63 | Temp 97.5°F | Resp 20 | Ht 67.0 in | Wt 219.0 lb

## 2019-09-04 DIAGNOSIS — M7989 Other specified soft tissue disorders: Secondary | ICD-10-CM

## 2019-09-04 NOTE — Progress Notes (Signed)
[facility]in Specialist of [place]  Patient name: Matthew Serrano. MRN: [id number] DOB: [birth year] Sex: male  REASON FOR CONSULT: Evaluation swelling left lower extremity  HPI: Matthew Serrano. is a 74 y.o. male, who is here today for evaluation of progressive swelling in his left lower extremity.  He reports this is been present for approximately 2 years.  He reports that this began becoming apparent around the time of the prostate cancer diagnosis.  He had chemotherapy.  Did not have any surgery or node dissection.  He reports initially the swelling was progressive through the day but now it is more fixed.  He also reports falling in December 2019 and injuring his knee and feels that that may be somewhat more progressive since that time as well.  He has no history of DVT  Past Medical History:  Diagnosis Date  . Aortic stenosis    Echo 8/18: mild LVH, EF 60-65, no RWMA, mild AS (mean 10, peak 19), MAC, trivial MR, normal RVSF  // Echo 01/2019: EF 60-65, Gr 1 DD, mod AS (mean 22, peak 39)   . DIABETES MELLITUS, TYPE II 07/06/2007  . ED (erectile dysfunction)   . Elevated PSA 01/20/2012  . Elevated PSA   . Heart murmur   . History of nuclear stress test    Myoview 8/18: EF 64, inferobasal thinning, no ischemia, low risk  . HYPERLIPIDEMIA 07/06/2007  . HYPERTENSION 07/06/2007  . HYPOTHYROIDISM 07/06/2007  . Leg pain    ABIs 8/18:  Normal   . Obesity     Family History  Problem Relation Age of Onset  . Hypertension Other   . Breast cancer Sister   . Breast cancer Other        family history  . Breast cancer Brother        prostate ca  . Aneurysm Mother        Brain aneurysm   . Prostate cancer Father   . Colon cancer Neg Hx     SOCIAL HISTORY: Social History   Socioeconomic History  . Marital status: Widowed    Spouse name: Not on file  . Number of children: Not on file  . Years of education: Not on file  . Highest  education level: Not on file  Occupational History  . Not on file  Tobacco Use  . Smoking status: Former Research scientist (life sciences)  . Smokeless tobacco: Never Used  . Tobacco comment: quit 2005  Substance and Sexual Activity  . Alcohol use: Yes    Alcohol/week: 21.0 standard drinks    Types: 21 Cans of beer per week  . Drug use: No  . Sexual activity: Not on file  Other Topics Concern  . Not on file  Social History Narrative   He works as a Freight forwarder    Not married    No kids          Social Determinants of Radio broadcast assistant Strain:   . Difficulty of Paying Living Expenses: Not on file  Food Insecurity:   . Worried About Charity fundraiser in the Last Year: Not on file  . Ran Out of Food in the Last Year: Not on file  Transportation Needs:   . Lack of Transportation (Medical): Not on file  . Lack of Transportation (Non-Medical): Not on file  Physical Activity:   . Days of Exercise per Week: Not on file  . Minutes of Exercise per Session: Not  on file  Stress:   . Feeling of Stress : Not on file  Social Connections:   . Frequency of Communication with Friends and Family: Not on file  . Frequency of Social Gatherings with Friends and Family: Not on file  . Attends Religious Services: Not on file  . Active Member of Clubs or Organizations: Not on file  . Attends Archivist Meetings: Not on file  . Marital Status: Not on file  Intimate Partner Violence:   . Fear of Current or Ex-Partner: Not on file  . Emotionally Abused: Not on file  . Physically Abused: Not on file  . Sexually Abused: Not on file    No Known Allergies  Current Outpatient Medications  Medication Sig Dispense Refill  . abiraterone acetate (ZYTIGA) 250 MG tablet TAKE 4 TABLETS (1,000 MG TOTAL) BY MOUTH DAILY. TAKE ON AN EMPTY STOMACH 1 HOUR BEFORE OR 2 HOURS AFTER A MEAL 120 tablet 0  . aspirin EC 81 MG tablet Take 81 mg by mouth daily.    Marland Kitchen atorvastatin (LIPITOR) 40 MG tablet TAKE 1  TABLET EVERY DAY 90 tablet 0  . cholecalciferol (VITAMIN D3) 25 MCG (1000 UT) tablet Take 1,000 Units by mouth daily.    . cloNIDine (CATAPRES) 0.2 MG tablet TAKE 2 TABLETS EVERY MORNING  AND TAKE 2 TABLETS EVERY EVENING 360 tablet 3  . doxazosin (CARDURA) 8 MG tablet TAKE 1 TABLET AT BEDTIME 90 tablet 3  . fosinopril (MONOPRIL) 40 MG tablet Take 2 tablets (80 mg total) by mouth daily. 200 tablet 1  . furosemide (LASIX) 40 MG tablet TAKE 2 TABLETS (80 MG TOTAL) BY MOUTH EVERY MORNING. 180 tablet 3  . glipiZIDE (GLUCOTROL XL) 10 MG 24 hr tablet TAKE 1 TABLET TWICE DAILY 180 tablet 0  . labetalol (NORMODYNE) 200 MG tablet TAKE 3 TABLETS EVERY MORNING  AND TAKE 2 TABLETS EVERY EVENING 450 tablet 0  . levothyroxine (SYNTHROID) 150 MCG tablet TAKE 1 TABLET EVERY DAY 90 tablet 3  . lidocaine-prilocaine (EMLA) cream Apply 1 application topically as needed. 30 g 0  . metFORMIN (GLUCOPHAGE) 1000 MG tablet TAKE 1 TABLET TWICE DAILY WITH MEALS 180 tablet 0  . NIFEdipine (PROCARDIA XL/NIFEDICAL XL) 60 MG 24 hr tablet TAKE 1 TABLET EVERY DAY 90 tablet 0  . pioglitazone (ACTOS) 15 MG tablet Take 1 tablet (15 mg total) by mouth daily. 90 tablet 0  . potassium chloride SA (KLOR-CON) 20 MEQ tablet TAKE 1 TABLET TWICE DAILY 180 tablet 3  . predniSONE (DELTASONE) 5 MG tablet Take 1 tablet (5 mg total) by mouth daily with breakfast. 90 tablet 3  . sildenafil (VIAGRA) 50 MG tablet Take 1 tablet (50 mg total) by mouth daily as needed for erectile dysfunction. 10 tablet 3   No current facility-administered medications for this visit.    REVIEW OF SYSTEMS:  _0  denotes positive finding, _1  denotes negative finding Cardiac  Comments:  Chest pain or chest pressure:    Shortness of breath upon exertion:    Short of breath when lying flat:    Irregular heart rhythm:        Vascular    Pain in calf, thigh, or hip brought on by ambulation:    Pain in feet at night that wakes you up from your sleep:     Blood clot  in your veins:    Leg swelling:  x       Pulmonary    Oxygen at home:  Productive cough:     Wheezing:         Neurologic    Sudden weakness in arms or legs:     Sudden numbness in arms or legs:     Sudden onset of difficulty speaking or slurred speech:    Temporary loss of vision in one eye:     Problems with dizziness:         Gastrointestinal    Blood in stool:     Vomited blood:         Genitourinary    Burning when urinating:     Blood in urine:        Psychiatric    Major depression:         Hematologic    Bleeding problems:    Problems with blood clotting too easily:        Skin    Rashes or ulcers:        Constitutional    Fever or chills:      PHYSICAL EXAM: Vitals:   09/04/19 1028  BP: 120/72  Pulse: 63  Resp: 20  Temp: (!) 97.5 F (36.4 C)  SpO2: 100%  Weight: 219 lb (99.3 kg)  Height: _0  (1.702 m)    GENERAL: The patient is a well-nourished male, in no acute distress. The vital signs are documented above. CARDIOVASCULAR: 2+ radial and 2+ dorsalis pedis pulses bilaterally.  He does have swelling in his left leg.  This continues down to his ankle and appears most consistent with lymphedema.  I do not see any evidence of hemosiderin deposits PULMONARY: There is good air exchange  ABDOMEN: Soft and non-tender  MUSCULOSKELETAL: There are no major deformities or cyanosis. NEUROLOGIC: No focal weakness or paresthesias are detected. SKIN: There are no ulcers or rashes noted. PSYCHIATRIC: The patient has a normal affect.  DATA:  Venous duplex today shows no evidence of DVT.  There is no evidence of any significant venous reflux.  MEDICAL ISSUES: Had a long discussion with the patient regarding his swelling.  Reassured him that this is not limb threatening.  It does appear to be most consistent with lymphedema.  I have instructed him on knee-high 20 to 30 mmHg compression garments.  I have offered to have him fitted here and also gave him  information regarding an outlet in [place] for a more economical pricing.  He was reassured with this discussion will see [place] again on an as-needed basis   Rosetta Posner, MD [facility] V[facility] and Vein Specialists of [facility] Tel [phone] Pager [phone]

## 2019-09-08 ENCOUNTER — Other Ambulatory Visit: Payer: Self-pay | Admitting: Adult Health

## 2019-09-20 ENCOUNTER — Other Ambulatory Visit: Payer: Self-pay | Admitting: Oncology

## 2019-09-20 DIAGNOSIS — C61 Malignant neoplasm of prostate: Secondary | ICD-10-CM

## 2019-09-21 MED FILL — ABIRATERONE ACETATE 250 MG: 250 | 30 days supply | Qty: 120 | Fill #0

## 2019-10-17 ENCOUNTER — Other Ambulatory Visit: Payer: Self-pay | Admitting: Oncology

## 2019-10-17 DIAGNOSIS — C61 Malignant neoplasm of prostate: Secondary | ICD-10-CM

## 2019-10-19 ENCOUNTER — Other Ambulatory Visit: Payer: Self-pay | Admitting: Adult Health

## 2019-10-19 NOTE — Telephone Encounter (Signed)
Appt scheduled for 10/23/2019 for A1C Follow Up.  Will Hold

## 2019-10-22 ENCOUNTER — Other Ambulatory Visit: Payer: Self-pay

## 2019-10-22 MED FILL — ABIRATERONE ACETATE 250 MG: 250 | 30 days supply | Qty: 120 | Fill #0

## 2019-10-23 ENCOUNTER — Encounter: Payer: Self-pay | Admitting: Adult Health

## 2019-10-23 ENCOUNTER — Telehealth: Payer: Self-pay

## 2019-10-23 ENCOUNTER — Ambulatory Visit (INDEPENDENT_AMBULATORY_CARE_PROVIDER_SITE_OTHER): Payer: Medicare Other | Admitting: Adult Health

## 2019-10-23 VITALS — BP 134/64 | Temp 98.6°F | Wt 216.0 lb

## 2019-10-23 DIAGNOSIS — E119 Type 2 diabetes mellitus without complications: Secondary | ICD-10-CM

## 2019-10-23 LAB — POCT GLYCOSYLATED HEMOGLOBIN (HGB A1C): HbA1c, POC (controlled diabetic range): 6.2 % (ref 0.0–7.0)

## 2019-10-23 MED ORDER — PIOGLITAZONE HCL 15 MG PO TABS: 15.0000 mg | ORAL_TABLET | Freq: Every day | ORAL | 1 refills | Status: DC

## 2019-10-23 NOTE — Patient Instructions (Signed)
It was great seeing you today.   Your A1c has improved to 6.2 - great job  Please follow up with me in July for a repeat diabetic check

## 2019-10-23 NOTE — Progress Notes (Signed)
Subjective:    Patient ID: Matthew Mcardle., male    DOB: [birth year], 74 y.o.   MRN: [id number]  HPI 74 year old male who  has a past medical history of Aortic stenosis, DIABETES MELLITUS, TYPE II (07/06/2007), ED (erectile dysfunction), Elevated PSA (01/20/2012), Elevated PSA, Heart murmur, History of nuclear stress test, HYPERLIPIDEMIA (07/06/2007), HYPERTENSION (07/06/2007), HYPOTHYROIDISM (07/06/2007), Leg pain, and Obesity.  He presents to the office today for 67-monthfollow-up regarding diabetes mellitus.  When he was seen in summer 2020 his A1c continued to be elevated at 7.6 despite being on Metformin 1000 mg twice a day and glipizide 10 mg extended release.  Added Actos 15 mg at this time.  He reports that his blood sugars have been as low as 88 and as high as 225 but for the most part his blood sugars have been in the 120s to 140s.  Denies any symptoms of hypoglycemia and has not noticed any side effects from starting Actos.   Review of Systems See HPI   Past Medical History:  Diagnosis Date  . Aortic stenosis    Echo 8/18: mild LVH, EF 60-65, no RWMA, mild AS (mean 10, peak 19), MAC, trivial MR, normal RVSF  // Echo 01/2019: EF 60-65, Gr 1 DD, mod AS (mean 22, peak 39)   . DIABETES MELLITUS, TYPE II 07/06/2007  . ED (erectile dysfunction)   . Elevated PSA 01/20/2012  . Elevated PSA   . Heart murmur   . History of nuclear stress test    Myoview 8/18: EF 64, inferobasal thinning, no ischemia, low risk  . HYPERLIPIDEMIA 07/06/2007  . HYPERTENSION 07/06/2007  . HYPOTHYROIDISM 07/06/2007  . Leg pain    ABIs 8/18:  Normal   . Obesity     Social History   Socioeconomic History  . Marital status: Widowed    Spouse name: Not on file  . Number of children: Not on file  . Years of education: Not on file  . Highest education level: Not on file  Occupational History  . Not on file  Tobacco Use  . Smoking status: Former SResearch scientist (life sciences) . Smokeless tobacco: Never Used  . Tobacco  comment: quit 2005  Substance and Sexual Activity  . Alcohol use: Yes    Alcohol/week: 21.0 standard drinks    Types: 21 Cans of beer per week  . Drug use: No  . Sexual activity: Not on file  Other Topics Concern  . Not on file  Social History Narrative   He works as a fFreight forwarder   Not married    No kids          Social Determinants of HRadio broadcast assistantStrain:   . Difficulty of Paying Living Expenses: Not on file  Food Insecurity:   . Worried About RCharity fundraiserin the Last Year: Not on file  . Ran Out of Food in the Last Year: Not on file  Transportation Needs:   . Lack of Transportation (Medical): Not on file  . Lack of Transportation (Non-Medical): Not on file  Physical Activity:   . Days of Exercise per Week: Not on file  . Minutes of Exercise per Session: Not on file  Stress:   . Feeling of Stress : Not on file  Social Connections:   . Frequency of Communication with Friends and Family: Not on file  . Frequency of Social Gatherings with Friends and Family: Not on file  . Attends  Religious Services: Not on file  . Active Member of Clubs or Organizations: Not on file  . Attends Archivist Meetings: Not on file  . Marital Status: Not on file  Intimate Partner Violence:   . Fear of Current or Ex-Partner: Not on file  . Emotionally Abused: Not on file  . Physically Abused: Not on file  . Sexually Abused: Not on file    Past Surgical History:  Procedure Laterality Date  . COLONOSCOPY    . COLONOSCOPY W/ BIOPSIES AND POLYPECTOMY    . IR IMAGING GUIDED PORT INSERTION  07/05/2018  . POLYPECTOMY      Family History  Problem Relation Age of Onset  . Hypertension Other   . Breast cancer Sister   . Breast cancer Other        family history  . Breast cancer Brother        prostate ca  . Aneurysm Mother        Brain aneurysm   . Prostate cancer Father   . Colon cancer Neg Hx     No Known Allergies  Current Outpatient  Medications on File Prior to Visit  Medication Sig Dispense Refill  . abiraterone acetate (ZYTIGA) 250 MG tablet TAKE 4 TABLETS (1,000 MG TOTAL) BY MOUTH DAILY. TAKE ON AN EMPTY STOMACH 1 HOUR BEFORE OR 2 HOURS AFTER A MEAL 120 tablet 0  . aspirin EC 81 MG tablet Take 81 mg by mouth daily.    Marland Kitchen atorvastatin (LIPITOR) 40 MG tablet TAKE 1 TABLET EVERY DAY 90 tablet 0  . cholecalciferol (VITAMIN D3) 25 MCG (1000 UT) tablet Take 1,000 Units by mouth daily.    . cloNIDine (CATAPRES) 0.2 MG tablet TAKE 2 TABLETS EVERY MORNING  AND TAKE 2 TABLETS EVERY EVENING 360 tablet 3  . doxazosin (CARDURA) 8 MG tablet TAKE 1 TABLET AT BEDTIME 90 tablet 3  . fosinopril (MONOPRIL) 40 MG tablet Take 2 tablets (80 mg total) by mouth daily. 200 tablet 1  . furosemide (LASIX) 40 MG tablet TAKE 2 TABLETS (80 MG TOTAL) BY MOUTH EVERY MORNING. 180 tablet 3  . glipiZIDE (GLUCOTROL XL) 10 MG 24 hr tablet TAKE 1 TABLET TWICE DAILY 180 tablet 0  . labetalol (NORMODYNE) 200 MG tablet TAKE 3 TABLETS EVERY MORNING  AND TAKE 2 TABLETS EVERY EVENING 450 tablet 0  . levothyroxine (SYNTHROID) 150 MCG tablet TAKE 1 TABLET EVERY DAY 90 tablet 3  . lidocaine-prilocaine (EMLA) cream Apply 1 application topically as needed. 30 g 0  . metFORMIN (GLUCOPHAGE) 1000 MG tablet TAKE 1 TABLET TWICE DAILY WITH MEALS 180 tablet 0  . NIFEdipine (PROCARDIA XL/NIFEDICAL XL) 60 MG 24 hr tablet TAKE 1 TABLET EVERY DAY 90 tablet 0  . pioglitazone (ACTOS) 15 MG tablet Take 1 tablet (15 mg total) by mouth daily. 90 tablet 0  . potassium chloride SA (KLOR-CON) 20 MEQ tablet TAKE 1 TABLET TWICE DAILY 180 tablet 3  . predniSONE (DELTASONE) 5 MG tablet TAKE 1 TABLET (5 MG TOTAL) BY MOUTH DAILY WITH BREAKFAST. 90 tablet 3  . sildenafil (VIAGRA) 50 MG tablet Take 1 tablet (50 mg total) by mouth daily as needed for erectile dysfunction. 10 tablet 3   No current facility-administered medications on file prior to visit.    BP 134/64   Temp 98.6 F (37 C)  (Temporal)   Wt 216 lb (98 kg)   BMI 33.83 kg/m       Objective:   Physical Exam Vitals and nursing  note reviewed.  Constitutional:      Appearance: Normal appearance. He is obese.  Cardiovascular:     Rate and Rhythm: Normal rate and regular rhythm.     Pulses: Normal pulses.     Heart sounds: Normal heart sounds.  Pulmonary:     Effort: Pulmonary effort is normal.     Breath sounds: Normal breath sounds.  Skin:    General: Skin is warm and dry.     Capillary Refill: Capillary refill takes less than 2 seconds.  Neurological:     General: No focal deficit present.     Mental Status: He is alert and oriented to person, place, and time.  Psychiatric:        Mood and Affect: Mood normal.        Behavior: Behavior normal.        Thought Content: Thought content normal.        Judgment: Judgment normal.        Assessment & Plan:  1. Type 2 diabetes mellitus without complication, without long-term current use of insulin (HCC)  - POCT A1C- 6.2 - has improved -We will continue with current regimen.  He was advised to follow-up if he experiences hypoglycemia.  I will see him back in 3 to 4 months - pioglitazone (ACTOS) 15 MG tablet; Take 1 tablet (15 mg total) by mouth daily.  Dispense: 90 tablet; Refill: 1  [name], NP

## 2019-10-23 NOTE — Telephone Encounter (Signed)
This was sent in earlier today by [facility].  This request denied.

## 2019-10-23 NOTE — Telephone Encounter (Signed)
Received fax from [facility] stating patient has requested to receive his Zytiga through them. Prescription form filled out and faxed to [phone]. Fax receipt confirmation received.

## 2019-10-25 ENCOUNTER — Other Ambulatory Visit: Payer: Self-pay | Admitting: Adult Health

## 2019-10-25 DIAGNOSIS — E119 Type 2 diabetes mellitus without complications: Secondary | ICD-10-CM

## 2019-10-26 NOTE — Telephone Encounter (Signed)
DENIED.  FILLED ON 10/23/2019.

## 2019-11-05 ENCOUNTER — Other Ambulatory Visit: Payer: Self-pay | Admitting: Adult Health

## 2019-11-05 DIAGNOSIS — E782 Mixed hyperlipidemia: Secondary | ICD-10-CM

## 2019-11-05 DIAGNOSIS — E119 Type 2 diabetes mellitus without complications: Secondary | ICD-10-CM

## 2019-11-05 DIAGNOSIS — I10 Essential (primary) hypertension: Secondary | ICD-10-CM

## 2019-11-07 NOTE — Telephone Encounter (Signed)
SENT TO THE PHARMACY BY E-SCRIBE. 

## 2019-11-14 ENCOUNTER — Other Ambulatory Visit: Payer: Self-pay | Admitting: Adult Health

## 2019-11-15 ENCOUNTER — Other Ambulatory Visit: Payer: Self-pay | Admitting: Oncology

## 2019-11-15 DIAGNOSIS — C61 Malignant neoplasm of prostate: Secondary | ICD-10-CM

## 2019-11-15 NOTE — Telephone Encounter (Signed)
Sent to the pharmacy by e-scribe. 

## 2019-11-22 MED FILL — ABIRATERONE ACETATE 250 MG: 250 | 30 days supply | Qty: 120 | Fill #0

## 2019-11-29 ENCOUNTER — Other Ambulatory Visit: Payer: Self-pay | Admitting: Adult Health

## 2019-11-29 DIAGNOSIS — I10 Essential (primary) hypertension: Secondary | ICD-10-CM

## 2019-11-29 NOTE — Telephone Encounter (Signed)
SENT TO THE PHARMACY BY E-SCRIBE. 

## 2019-12-05 MED FILL — predniSONE 5 MG TABS: 5 | 90 days supply | Qty: 90 | Fill #0

## 2019-12-19 ENCOUNTER — Other Ambulatory Visit: Payer: Self-pay | Admitting: Oncology

## 2019-12-19 DIAGNOSIS — C61 Malignant neoplasm of prostate: Secondary | ICD-10-CM

## 2019-12-20 ENCOUNTER — Telehealth: Payer: Self-pay

## 2019-12-20 MED FILL — ABIRATERONE ACETATE 250 MG: 250 | 30 days supply | Qty: 120 | Fill #0

## 2019-12-20 NOTE — Telephone Encounter (Signed)
Oral Oncology Patient Advocate Encounter  Patient has been approved for copay assistance with [employer] (TAF).  [employer] [facility] will cover all copayment expenses for Zytiga for the remainder of the calendar year.    The billing information is as follows and has been shared with [place].   Member ID: [id number] Group ID: [id number] PCN: [id number] BIN: [id number] Eligibility Dates: 08/24/19 to 08/22/20  Fund: [place] [facility] [place] Phone [phone] Fax [phone] 12/20/2019 10:43 AM

## 2019-12-27 ENCOUNTER — Inpatient Hospital Stay (HOSPITAL_BASED_OUTPATIENT_CLINIC_OR_DEPARTMENT_OTHER): Payer: Medicare Other | Admitting: Oncology

## 2019-12-27 ENCOUNTER — Inpatient Hospital Stay: Payer: Medicare Other

## 2019-12-27 ENCOUNTER — Other Ambulatory Visit: Payer: Self-pay

## 2019-12-27 ENCOUNTER — Inpatient Hospital Stay: Payer: Medicare Other | Attending: Oncology

## 2019-12-27 VITALS — BP 162/74 | HR 63 | Temp 98.3°F | Resp 17 | Ht 67.0 in | Wt 218.1 lb

## 2019-12-27 DIAGNOSIS — Z79899 Other long term (current) drug therapy: Secondary | ICD-10-CM | POA: Insufficient documentation

## 2019-12-27 DIAGNOSIS — E876 Hypokalemia: Secondary | ICD-10-CM | POA: Diagnosis not present

## 2019-12-27 DIAGNOSIS — I10 Essential (primary) hypertension: Secondary | ICD-10-CM | POA: Insufficient documentation

## 2019-12-27 DIAGNOSIS — C61 Malignant neoplasm of prostate: Secondary | ICD-10-CM | POA: Insufficient documentation

## 2019-12-27 DIAGNOSIS — R591 Generalized enlarged lymph nodes: Secondary | ICD-10-CM | POA: Insufficient documentation

## 2019-12-27 LAB — CMP (CANCER CENTER ONLY)
ALT: 15 U/L (ref 0–44)
AST: 15 U/L (ref 15–41)
Albumin: 3.3 g/dL — ABNORMAL LOW (ref 3.5–5.0)
Alkaline Phosphatase: 56 U/L (ref 38–126)
Anion gap: 6 (ref 5–15)
BUN: 18 mg/dL (ref 8–23)
CO2: 26 mmol/L (ref 22–32)
Calcium: 8.7 mg/dL — ABNORMAL LOW (ref 8.9–10.3)
Chloride: 107 mmol/L (ref 98–111)
Creatinine: 0.87 mg/dL (ref 0.61–1.24)
GFR, Est AFR Am: >60 mL/min (ref >60)
GFR, Estimated: >60 mL/min (ref >60)
Glucose, Bld: 76 mg/dL (ref 70–99)
Potassium: 3.2 mmol/L — ABNORMAL LOW (ref 3.5–5.1)
Sodium: 139 mmol/L (ref 135–145)
Total Bilirubin: 0.5 mg/dL (ref 0.3–1.2)
Total Protein: 5.9 g/dL — ABNORMAL LOW (ref 6.5–8.1)

## 2019-12-27 LAB — CBC WITH DIFFERENTIAL (CANCER CENTER ONLY)
Abs Immature Granulocytes: 0.01 10*3/uL (ref 0.00–0.07)
Basophils Absolute: 0.1 10*3/uL (ref 0.0–0.1)
Basophils Relative: 1 %
Eosinophils Absolute: 0.1 10*3/uL (ref 0.0–0.5)
Eosinophils Relative: 2 %
HCT: 29.8 % — ABNORMAL LOW (ref 39.0–52.0)
Hemoglobin: 9.5 g/dL — ABNORMAL LOW (ref 13.0–17.0)
Immature Granulocytes: 0 %
Lymphocytes Relative: 37 %
Lymphs Abs: 2.3 10*3/uL (ref 0.7–4.0)
MCH: 26.7 pg (ref 26.0–34.0)
MCHC: 31.9 g/dL (ref 30.0–36.0)
MCV: 83.7 fL (ref 80.0–100.0)
Monocytes Absolute: 0.6 10*3/uL (ref 0.1–1.0)
Monocytes Relative: 9 %
Neutro Abs: 3.1 10*3/uL (ref 1.7–7.7)
Neutrophils Relative %: 51 %
Platelet Count: 188 10*3/uL (ref 150–400)
RBC: 3.56 MIL/uL — ABNORMAL LOW (ref 4.22–5.81)
RDW: 15 % (ref 11.5–15.5)
WBC Count: 6.1 10*3/uL (ref 4.0–10.5)
nRBC: 0 % (ref 0.0–0.2)

## 2019-12-27 MED ORDER — LEUPROLIDE ACETATE (4 MONTH) 30 MG ~~LOC~~ KIT
30.0000 mg | PACK | Freq: Once | SUBCUTANEOUS | Status: AC
  Administered 2019-12-27: 30 mg via SUBCUTANEOUS
  Filled 2019-12-27: qty 30

## 2019-12-27 MED ORDER — HEPARIN SOD (PORK) LOCK FLUSH 100 UNIT/ML IV SOLN
500.0000 [IU] | Freq: Once | INTRAVENOUS | Status: AC | PRN
  Administered 2019-12-27: 500 [IU]
  Filled 2019-12-27: qty 5

## 2019-12-27 MED ORDER — SODIUM CHLORIDE 0.9% FLUSH
10.0000 mL | INTRAVENOUS | Status: DC | PRN
  Administered 2019-12-27: 10 mL
  Filled 2019-12-27: qty 10

## 2019-12-27 NOTE — Progress Notes (Signed)
Hematology and Oncology Follow Up Visit  Auther Chapla [id number] 1945-10-15 74 y.o. 12/27/2019 10:10 AM Matthew Serrano, [name], [name], NP   Principle Diagnosis: 74 year old man with advanced prostate cancer with a lymphadenopathy diagnosed in 2019.  He presented with PSA of 1170 and a Gleason score 4+5 = 9 and currently has castration-resistant disease.   Prior Therapy:  Status post prostate biopsy obtained on 06/06/2018 which showed high-volume disease with a Gleason score 4+5 = 9.  Taxotere chemotherapy at 75 mg/m every 3 weeks started on 07/07/2018.  He he is here for cycle 6 of therapy.  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  Current therapy:   Eligard 30 mg every 4 months.  He will receive injection on May 6 of 2021 repeated in 4 months.  Zytiga 1000 mg daily started in April 2020.  Interim History: [name] presents today for a repeat evaluation.  Since the last visit, he continues to tolerate Zytiga without any major complaints.  He denies any nausea, vomiting with abdominal distention.  He denies any worsening edema.  He does report a mild elevation in his blood pressure periodically.  He does report hot flashes but manageable overall.  His quality of life remained excellent.                   Medications: Unchanged on review. Current Outpatient Medications  Medication Sig Dispense Refill  . abiraterone acetate (ZYTIGA) 250 MG tablet TAKE 4 TABLETS (1,000 MG TOTAL) BY MOUTH DAILY. TAKE ON AN EMPTY STOMACH 1 HOUR BEFORE OR 2 HOURS AFTER A MEAL 120 tablet 0  . aspirin EC 81 MG tablet Take 81 mg by mouth daily.    Marland Kitchen atorvastatin (LIPITOR) 40 MG tablet TAKE 1 TABLET EVERY DAY 90 tablet 2  . cholecalciferol (VITAMIN D3) 25 MCG (1000 UT) tablet Take 1,000 Units by mouth daily.    . cloNIDine (CATAPRES) 0.2 MG tablet TAKE 2 TABLETS EVERY MORNING  AND TAKE 2 TABLETS EVERY EVENING 360 tablet 3  .  doxazosin (CARDURA) 8 MG tablet TAKE 1 TABLET AT BEDTIME 90 tablet 3  . fosinopril (MONOPRIL) 40 MG tablet TAKE 2 TABLETS BY MOUTH EVERY DAY 180 tablet 1  . furosemide (LASIX) 40 MG tablet TAKE 2 TABLETS (80 MG TOTAL) BY MOUTH EVERY MORNING. 180 tablet 3  . glipiZIDE (GLUCOTROL XL) 10 MG 24 hr tablet TAKE 1 TABLET TWICE DAILY 180 tablet 0  . labetalol (NORMODYNE) 200 MG tablet TAKE 3 TABLETS EVERY MORNING  AND TAKE 2 TABLETS EVERY EVENING 450 tablet 2  . levothyroxine (SYNTHROID) 150 MCG tablet TAKE 1 TABLET EVERY DAY 90 tablet 3  . lidocaine-prilocaine (EMLA) cream Apply 1 application topically as needed. 30 g 0  . metFORMIN (GLUCOPHAGE) 1000 MG tablet TAKE 1 TABLET TWICE DAILY WITH MEALS 180 tablet 0  . NIFEdipine (PROCARDIA XL/NIFEDICAL XL) 60 MG 24 hr tablet TAKE 1 TABLET EVERY DAY 90 tablet 2  . pioglitazone (ACTOS) 15 MG tablet Take 1 tablet (15 mg total) by mouth daily. 90 tablet 1  . potassium chloride SA (KLOR-CON) 20 MEQ tablet TAKE 1 TABLET TWICE DAILY 180 tablet 3  . predniSONE (DELTASONE) 5 MG tablet TAKE 1 TABLET (5 MG TOTAL) BY MOUTH DAILY WITH BREAKFAST. 90 tablet 3  . sildenafil (VIAGRA) 50 MG tablet Take 1 tablet (50 mg total) by mouth daily as needed for erectile dysfunction. 10 tablet 3   No current facility-administered medications  for this visit.     Allergies: No Known Allergies      Physical Exam:    Blood pressure (!) 162/74, pulse 63, temperature 98.3 F (36.8 C), temperature source Temporal, resp. rate 17, height 5\' 7"  (1.702 m), weight 218 lb 1.6 oz (98.9 kg), SpO2 100 %.      ECOG: 0    General appearance: Alert, awake without any distress. Head: Atraumatic without abnormalities Oropharynx: Without any thrush or ulcers. Eyes: No scleral icterus. Lymph nodes: No lymphadenopathy noted in the cervical, supraclavicular, or axillary nodes Heart:regular rate and rhythm, without any murmurs or gallops.   Lung: Clear to auscultation without any  rhonchi, wheezes or dullness to percussion. Abdomin: Soft, nontender without any shifting dullness or ascites. Musculoskeletal: No clubbing or cyanosis. Neurological: No motor or sensory deficits. Skin: No rashes or lesions. al.            Lab Results: Lab Results  Component Value Date   WBC 6.1 12/27/2019   HGB 9.5 (L) 12/27/2019   HCT 29.8 (L) 12/27/2019   MCV 83.7 12/27/2019   PLT 188 12/27/2019     Chemistry      Component Value Date/Time   NA 141 08/29/2019 0902   NA 143 05/16/2018 1126   K 3.6 08/29/2019 0902   CL 105 08/29/2019 0902   CO2 24 08/29/2019 0902   BUN 15 08/29/2019 0902   BUN 7 (L) 05/16/2018 1126   CREATININE 0.86 08/29/2019 0902      Component Value Date/Time   CALCIUM 8.5 (L) 08/29/2019 0902   ALKPHOS 54 08/29/2019 0902   AST 19 08/29/2019 0902   ALT 20 08/29/2019 0902   BILITOT 0.6 08/29/2019 0902           Results for [place], Matthew Serrano (MRN [id number]) as of 12/27/2019 10:04  Ref. Range 04/27/2019 09:10 06/27/2019 09:51 08/29/2019 09:02  Prostate Specific Ag, Serum Latest Ref Range: 0.0 - 4.0 ng/mL <0.1 <0.1 <0.1         Impression and Plan:  74 year old man with:  1.  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      He is currently on Zytiga after developing castration-resistant disease in April 2020 with excellent PSA response.  He continues to have undetectable PSA with excellent clinical monitoring.  Risks and benefits of continuing this treatment long-term were reviewed.  Potential complications include nausea, fatigue, edema and adrenal insufficiency were reiterated.  Given his excellent benefit and response he is agreeable to continue.  Alternative treatment options such as Jevtana chemotherapy among other options were reviewed.  These will be reserved if he developed disease progression in the future.  2.  IV access: Port-A-Cath currently in place and will be flushed  periodically.  3.  Androgen deprivation: Complication associated with this therapy were reviewed today.  Including hot flashes and weight gain among others.  He received Eligard today and repeated in 4 months.  5.  Goals of care and prognosis: His disease is incurable although aggressive measures are warranted given his excellent performance status.  6.  Hypokalemia: His potassium remains close to normal range and will monitor on Zytiga.  7.  Hypertension: Mildly elevated today but has been close to normal range between visits.  8.  Follow-up: He will return in 4 months for a follow-up and repeat Eligard injection.  30  minutes were dedicated to this visit. The time was spent on reviewing laboratory data, discussing treatment options, and  answering questions regarding future plan.      [name], MD 5/6/202110:10 AM

## 2019-12-27 NOTE — Patient Instructions (Signed)

## 2019-12-28 ENCOUNTER — Telehealth: Payer: Self-pay

## 2019-12-28 ENCOUNTER — Telehealth: Payer: Self-pay | Admitting: Oncology

## 2019-12-28 LAB — PROSTATE-SPECIFIC AG, SERUM (LABCORP): Prostate Specific Ag, Serum: <0.1 ng/mL (ref 0.0–4.0)

## 2019-12-28 NOTE — Telephone Encounter (Signed)
-----   Message from Wyatt Portela, MD sent at 12/28/2019  8:12 AM EDT ----- Please let him know his PSA is still low.

## 2019-12-28 NOTE — Telephone Encounter (Signed)
Scheduled appt per 5/6 los.  Left a vm of the appt date and time.

## 2019-12-28 NOTE — Telephone Encounter (Signed)
Called and left voicemail informing patient to contact office to find out about PSA results.

## 2019-12-28 NOTE — Telephone Encounter (Signed)
Patient called office back and was informed of PSA results. Patient verbalized understanding.  All questions were answered during phone call.

## 2020-01-14 ENCOUNTER — Other Ambulatory Visit: Payer: Self-pay | Admitting: Adult Health

## 2020-01-14 DIAGNOSIS — E119 Type 2 diabetes mellitus without complications: Secondary | ICD-10-CM

## 2020-01-15 ENCOUNTER — Other Ambulatory Visit: Payer: Self-pay | Admitting: Oncology

## 2020-01-15 DIAGNOSIS — C61 Malignant neoplasm of prostate: Secondary | ICD-10-CM

## 2020-01-17 MED FILL — ABIRATERONE ACETATE 250 MG: 250 | 30 days supply | Qty: 120 | Fill #0

## 2020-01-22 ENCOUNTER — Other Ambulatory Visit: Payer: Self-pay | Admitting: Adult Health

## 2020-01-24 NOTE — Telephone Encounter (Signed)
Sent to the pharmacy by e-scribe for 90 days.  Pt has upcoming cpx on 02/22/20

## 2020-02-05 ENCOUNTER — Other Ambulatory Visit: Payer: Self-pay

## 2020-02-05 ENCOUNTER — Ambulatory Visit (HOSPITAL_COMMUNITY): Payer: Medicare Other | Attending: Cardiovascular Disease

## 2020-02-05 DIAGNOSIS — I350 Nonrheumatic aortic (valve) stenosis: Secondary | ICD-10-CM | POA: Diagnosis not present

## 2020-02-08 ENCOUNTER — Ambulatory Visit (INDEPENDENT_AMBULATORY_CARE_PROVIDER_SITE_OTHER): Payer: Medicare Other | Admitting: Cardiovascular Disease

## 2020-02-08 ENCOUNTER — Other Ambulatory Visit: Payer: Self-pay

## 2020-02-08 ENCOUNTER — Encounter: Payer: Self-pay | Admitting: Cardiovascular Disease

## 2020-02-08 VITALS — BP 118/62 | HR 58 | Ht 67.0 in | Wt 215.0 lb

## 2020-02-08 DIAGNOSIS — I350 Nonrheumatic aortic (valve) stenosis: Secondary | ICD-10-CM

## 2020-02-08 NOTE — Patient Instructions (Signed)
CATHETERIZATION INSTRUCTIONS: You are scheduled for a Cardiac Catheterization on: Wednesday, February 13, 2020 with Dr. Daneen Schick.  1. Please arrive at the [facility] (Main Entrance A) at [facility]: [address] [place], [place] [address] at 11:00 AM (This time is two hours before your procedure to ensure your preparation). Free valet parking service is available. You are allowed ONE visitor in the waiting room during your procedure. Both you and your guest must wear masks. Special note: Every effort is made to have your procedure done on time. Please understand that emergencies sometimes delay scheduled procedures.  2. Diet: Do not eat solid foods after midnight.  You may have clear liquids until 5am upon the day of the procedure.  3. Labs: TODAY! BMET, CBC  4. Medication instructions in preparation for your procedure:  1) HOLD METFORMIN the morning of and 2 days after your cath  2) HOLD GLIPIZIDE the morning of your cath  3) HOLD ACTOS the morning of your cath  4) HOLD LASIX the morning of your cath  5) MAKE SURE TO TAKE YOUR ASPIRIN the morning of your cath  6) You may take your other meds as directed with sips of water  5. Plan for one night stay--bring personal belongings. 6. Bring a current list of your medications and current insurance cards. 7. You MUST have a responsible person to drive you home. 8. Someone MUST be with you the first 24 hours after you arrive home or your discharge will be delayed. 9. Please wear clothes that are easy to get on and off and wear slip-on shoes.  Thank you for allowing [place] to care for you!   --  Invasive Cardiovascular services

## 2020-02-08 NOTE — Progress Notes (Signed)
Cardiology Office Note:    Date:  02/12/2020   ID:  Beecher Mcardle., DOB 04/30/1946, MRN [id number]  PCP:  [name], NP  [facility] Cardiologist:  [name], MD  [place] Electrophysiologist:  None   Referring MD: [name], NP   Chief Complaint  Patient presents with  . Aortic Stenosis    History of Present Illness:    Matthew Serrano. is a 74 y.o. male with a hx of aortic stenosis, referred for evaluation of severe aortic stenosis recently identified on surveillance echo. He was previously followed by Dr Aundra Dubin and was last seen by Richardson Dopp in 2018. This is his first visit with me.   The patient is here alone today. He does not recall having a longstanding heart murmur but this is noted in Scott's note from 2018. He has heartburn but feels like this is related to eating certain foods. He denies exertional chest pain or pressure. He admits to exertional dyspnea with low level activity and he leads a fairly sedentary lifestyle. He has chronic left leg swelling since he was diagnosed with prostate CA, possibly lymphedema. He still mows the lawn with a riding mower. The patient is a widower x 30 years and he has step-children from his marriage.   The patient has advanced prostate cancer but again remains functionally independent and is felt to have a reasonably good prognosis, clearly with life expectancy predicted greater than 1 year.  He is followed closely by Dr. Alen Blew with oncology.  Past Medical History:  Diagnosis Date  . Aortic stenosis    Echo 8/18: mild LVH, EF 60-65, no RWMA, mild AS (mean 10, peak 19), MAC, trivial MR, normal RVSF  // Echo 01/2019: EF 60-65, Gr 1 DD, mod AS (mean 22, peak 39)   . DIABETES MELLITUS, TYPE II 07/06/2007  . ED (erectile dysfunction)   . Elevated PSA 01/20/2012  . Elevated PSA   . Heart murmur   . History of nuclear stress test    Myoview 8/18: EF 64, inferobasal thinning, no ischemia, low risk  .  HYPERLIPIDEMIA 07/06/2007  . HYPERTENSION 07/06/2007  . HYPOTHYROIDISM 07/06/2007  . Leg pain    ABIs 8/18:  Normal   . Obesity     Past Surgical History:  Procedure Laterality Date  . COLONOSCOPY    . COLONOSCOPY W/ BIOPSIES AND POLYPECTOMY    . IR IMAGING GUIDED PORT INSERTION  07/05/2018  . POLYPECTOMY      Current Medications: Current Meds  Medication Sig  . aspirin EC 81 MG tablet Take 81 mg by mouth daily.  Marland Kitchen atorvastatin (LIPITOR) 40 MG tablet TAKE 1 TABLET EVERY DAY (Patient taking differently: Take 40 mg by mouth daily. TAKE 1 TABLET EVERY DAY)  . cholecalciferol (VITAMIN D3) 25 MCG (1000 UT) tablet Take 1,000 Units by mouth daily.  . cloNIDine (CATAPRES) 0.2 MG tablet TAKE 2 TABLETS EVERY MORNING  AND TAKE 2 TABLETS EVERY EVENING (Patient taking differently: Take 0.4 mg by mouth 2 (two) times daily. TAKE 2 TABLETS EVERY MORNING  AND TAKE 2 TABLETS EVERY EVENING)  . doxazosin (CARDURA) 8 MG tablet TAKE 1 TABLET AT BEDTIME (Patient taking differently: Take 8 mg by mouth at bedtime. TAKE 1 TABLET AT BEDTIME)  . fosinopril (MONOPRIL) 40 MG tablet TAKE 2 TABLETS BY MOUTH EVERY DAY (Patient taking differently: Take 80 mg by mouth daily. )  . furosemide (LASIX) 40 MG tablet TAKE 2 TABLETS (80 MG TOTAL) BY MOUTH  EVERY MORNING. (Patient taking differently: Take 80 mg by mouth daily. )  . glipiZIDE (GLUCOTROL XL) 10 MG 24 hr tablet TAKE 1 TABLET TWICE DAILY (Patient taking differently: Take 10 mg by mouth in the morning and at bedtime. TAKE 1 TABLET TWICE DAILY)  . labetalol (NORMODYNE) 200 MG tablet TAKE 3 TABLETS EVERY MORNING  AND TAKE 2 TABLETS EVERY EVENING (Patient taking differently: Take 400-600 mg by mouth See admin instructions. TAKE 3 TABLETS EVERY MORNING  AND TAKE 2 TABLETS EVERY EVENING)  . levothyroxine (SYNTHROID) 150 MCG tablet TAKE 1 TABLET EVERY DAY (Patient taking differently: Take 150 mcg by mouth daily before breakfast. TAKE 1 TABLET EVERY DAY)  .  lidocaine-prilocaine (EMLA) cream Apply 1 application topically as needed. (Patient taking differently: Apply 1 application topically as needed (port). )  . metFORMIN (GLUCOPHAGE) 1000 MG tablet TAKE 1 TABLET TWICE DAILY WITH MEALS (Patient taking differently: Take 1,000 mg by mouth 2 (two) times daily with a meal. )  . NIFEdipine (PROCARDIA XL/NIFEDICAL XL) 60 MG 24 hr tablet TAKE 1 TABLET EVERY DAY (Patient taking differently: Take 60 mg by mouth daily. TAKE 1 TABLET EVERY DAY)  . pioglitazone (ACTOS) 15 MG tablet Take 1 tablet (15 mg total) by mouth daily.  . potassium chloride SA (KLOR-CON) 20 MEQ tablet TAKE 1 TABLET TWICE DAILY (Patient taking differently: Take 20 mEq by mouth 2 (two) times daily. TAKE 1 TABLET TWICE DAILY)  . predniSONE (DELTASONE) 5 MG tablet TAKE 1 TABLET (5 MG TOTAL) BY MOUTH DAILY WITH BREAKFAST.  . sildenafil (VIAGRA) 50 MG tablet Take 1 tablet (50 mg total) by mouth daily as needed for erectile dysfunction.  . [DISCONTINUED] abiraterone acetate (ZYTIGA) 250 MG tablet TAKE 4 TABLETS (1,000 MG TOTAL) BY MOUTH DAILY. TAKE ON AN EMPTY STOMACH 1 HOUR BEFORE OR 2 HOURS AFTER A MEAL     Allergies:   Patient has no known allergies.   Social History   Socioeconomic History  . Marital status: Widowed    Spouse name: Not on file  . Number of children: Not on file  . Years of education: Not on file  . Highest education level: Not on file  Occupational History  . Not on file  Tobacco Use  . Smoking status: Former Research scientist (life sciences)  . Smokeless tobacco: Never Used  . Tobacco comment: quit 2005  Vaping Use  . Vaping Use: Never used  Substance and Sexual Activity  . Alcohol use: Yes    Alcohol/week: 21.0 standard drinks    Types: 21 Cans of beer per week  . Drug use: No  . Sexual activity: Not on file  Other Topics Concern  . Not on file  Social History Narrative   He works as a Freight forwarder    Not married    No kids          Social Determinants of Adult nurse Strain:   . Difficulty of Paying Living Expenses:   Food Insecurity:   . Worried About Charity fundraiser in the Last Year:   . Arboriculturist in the Last Year:   Transportation Needs:   . Film/video editor (Medical):   Marland Kitchen Lack of Transportation (Non-Medical):   Physical Activity:   . Days of Exercise per Week:   . Minutes of Exercise per Session:   Stress:   . Feeling of Stress :   Social Connections:   . Frequency of Communication with Friends and Family:   .  Frequency of Social Gatherings with Friends and Family:   . Attends Religious Services:   . Active Member of Clubs or Organizations:   . Attends Archivist Meetings:   Marland Kitchen Marital Status:      Family History: The patient's family history includes Aneurysm in his mother; Breast cancer in his brother, sister, and another family member; Hypertension in an other family member; Prostate cancer in his father. There is no history of Colon cancer.  ROS:   Please see the history of present illness.    All other systems reviewed and are negative.  EKGs/Labs/Other Studies Reviewed:    The following studies were reviewed today: Echo 02/05/2020: IMPRESSIONS    1. The aortic valve is tricuspid. Aortic valve regurgitation is not  visualized. Severe aortic valve stenosis. Aortic valve area, by VTI  measures 0.88 cm. Aortic valve mean gradient measures 44.0 mmHg. Aortic  valve Vmax measures 4.03 m/s.  2. Left ventricular ejection fraction, by estimation, is 65 to 70%. The  left ventricle has normal function. The left ventricle has no regional  wall motion abnormalities. There is mild concentric left ventricular  hypertrophy. Left ventricular diastolic  parameters are consistent with Grade I diastolic dysfunction (impaired  relaxation).  3. Right ventricular systolic function is normal. The right ventricular  size is normal. There is normal pulmonary artery systolic pressure. The  estimated  right ventricular systolic pressure is 83.3 mmHg.  4. Left atrial size was mildly dilated.  5. The mitral valve is grossly normal. Mild mitral valve regurgitation.  No evidence of mitral stenosis.  6. The inferior vena cava is normal in size with greater than 50%  respiratory variability, suggesting right atrial pressure of 3 mmHg.  EKG:  EKG is ordered today.  The ekg ordered today demonstrates sinus rhythm 58 bpm, LVH, nonspecific ST change  Recent Labs: 07/26/2019: TSH 1.92 12/27/2019: ALT 15 02/08/2020: BUN 15; Creatinine, Ser 0.99; Hemoglobin 9.9; Platelets 217; Potassium 3.9; Sodium 144  Recent Lipid Panel    Component Value Date/Time   CHOL 154 07/26/2019 0921   TRIG 113.0 07/26/2019 0921   HDL 48.20 07/26/2019 0921   CHOLHDL 3 07/26/2019 0921   VLDL 22.6 07/26/2019 0921   LDLCALC 83 07/26/2019 0921   LDLDIRECT 105.0 04/28/2016 0756    Physical Exam:    VS:  BP 118/62 (BP Location: Left Arm, Patient Position: Sitting, Cuff Size: Normal)   Pulse (!) 58   Ht _0  (1.702 m)   Wt 215 lb (97.5 kg)   SpO2 98%   BMI 33.67 kg/m     Wt Readings from Last 3 Encounters:  02/08/20 215 lb (97.5 kg)  12/27/19 218 lb 1.6 oz (98.9 kg)  10/23/19 216 lb (98 kg)     GEN:  Well nourished, well developed in no acute distress HEENT: Normal NECK: No JVD; No carotid bruits LYMPHATICS: No lymphadenopathy CARDIAC: RRR, 3/6 harsh late peaking systolic murmur at the right upper sternal border, no diastolic murmur, A2 diminished RESPIRATORY:  Clear to auscultation without rales, wheezing or rhonchi  ABDOMEN: Soft, non-tender, non-distended MUSCULOSKELETAL:  No edema; No deformity  SKIN: Warm and dry NEUROLOGIC:  Alert and oriented x 3 PSYCHIATRIC:  Normal affect   STS Risk Calculator: Risk of Mortality: 1.612% Renal Failure: 3.303% Permanent Stroke: 1.439% Prolonged Ventilation: 6.344% DSW Infection: 0.229% Reoperation: 4.092% Morbidity or Mortality: 11.549% Short Length  of Stay: 35.079% Long Length of Stay: 5.704%  ASSESSMENT:    1. Nonrheumatic aortic valve stenosis  PLAN:    In order of problems listed above:  The patient has severe, stage D1 aortic stenosis with New York Heart Association functional class II limitation of exertional dyspnea and fatigue.  I have discussed the natural history of aortic stenosis with the patient at length today.  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  I suspect reasonable treatment options would include either palliative medical therapy or TAVR.  Considering the patient's functional independence and good quality of life, he would like to proceed with further evaluation and treatment of his aortic stenosis.  I have personally reviewed his echo study which demonstrates vigorous LV systolic function, severe calcification and restriction of the aortic valve with a peak systolic velocity in excess of 4 m/s, peak and mean transaortic valve gradients of 65 and 44 mmHg, respectively, and a dimensionless index of 0.28.  I think these findings are clearly consistent with severe aortic stenosis which also correlates with his physical exam findings and clinical symptoms.  The patient understands that further evaluation will include a right and left heart catheterization as well as CTA studies of the chest, abdomen, and pelvis.  He will require a gated cardiac CTA scan for TAVR planning. I have reviewed the risks, indications, and alternatives to cardiac catheterization, possible angioplasty, and stenting with the patient. Risks include but are not limited to bleeding, infection, vascular injury, stroke, myocardial infection, arrhythmia, kidney injury, radiation-related injury in the case of prolonged fluoroscopy use, emergency cardiac surgery, and death. The patient understands the risks of serious complication  is 1-2 in 2423 with diagnostic cardiac cath and 1-2% or less with angioplasty/stenting.  Once his studies are completed, he will be referred for formal surgical consultation in anticipation of TAVR as part of a multidisciplinary approach to his care.  Medication Adjustments/Labs and Tests Ordered: Current medicines are reviewed at length with the patient today.  Concerns regarding medicines are outlined above.  Orders Placed This Encounter  Procedures  . CT CORONARY MORPH W/CTA COR W/SCORE W/CA W/CM &/OR WO/CM  . CT ANGIO CHEST AORTA W/CM & OR WO/CM  . CT ANGIO ABDOMEN PELVIS  W &/OR WO CONTRAST  . Basic metabolic panel  . CBC with Differential/Platelet  . EKG 12-Lead  . VAS US CAROTID   No orders of the defined types were placed in this encounter.   Patient Instructions  CATHETERIZATION INSTRUCTIONS: You are scheduled for a Cardiac Catheterization on: Wednesday, February 13, 2020 with Dr. Daneen Schick.  1. Please arrive at the [facility] (Main Entrance A) at [facility]: [address] [place], [place] [address] at 11:00 AM (This time is two hours before your procedure to ensure your preparation). Free valet parking service is available. You are allowed ONE visitor in the waiting room during your procedure. Both you and your guest must wear masks. Special note: Every effort is made to have your procedure done on time. Please understand that emergencies sometimes delay scheduled procedures.  2. Diet: Do not eat solid foods after midnight.  You may have clear liquids until 5am upon the day of the procedure.  3. Labs: TODAY! BMET, CBC  4. Medication instructions in preparation for your procedure:  1) HOLD METFORMIN the morning of and 2 days after your cath  2) HOLD GLIPIZIDE the morning of your cath  3) HOLD ACTOS the morning of your cath  4) HOLD LASIX  the morning of your cath  5) MAKE SURE TO TAKE YOUR ASPIRIN the morning of your cath  6) You may take your other meds as directed  with sips of water  5. Plan for one night stay--bring personal belongings. 6. Bring a current list of your medications and current insurance cards. 7. You MUST have a responsible person to drive you home. 8. Someone MUST be with you the first 24 hours after you arrive home or your discharge will be delayed. 9. Please wear clothes that are easy to get on and off and wear slip-on shoes.  Thank you for allowing [place] to care for you!   -- [facility] Invasive Cardiovascular services     Signed, [name], MD  02/12/2020 10:37 AM    [place]

## 2020-02-08 NOTE — H&P (View-Only) (Signed)
Cardiology Office Note:    Date:  02/12/2020   ID:  Matthew Serrano., DOB May 14, 1946, MRN [id number]  PCP:  [name], NP  [facility] Cardiologist:  [name], MD  [place] Electrophysiologist:  None   Referring MD: [name], NP   Chief Complaint  Patient presents with  . Aortic Stenosis    History of Present Illness:    Matthew Serrano. is a 74 y.o. male with a hx of aortic stenosis, referred for evaluation of severe aortic stenosis recently identified on surveillance echo. He was previously followed by Dr Aundra Dubin and was last seen by Richardson Dopp in 2018. This is his first visit with me.   The patient is here alone today. He does not recall having a longstanding heart murmur but this is noted in Scott's note from 2018. He has heartburn but feels like this is related to eating certain foods. He denies exertional chest pain or pressure. He admits to exertional dyspnea with low level activity and he leads a fairly sedentary lifestyle. He has chronic left leg swelling since he was diagnosed with prostate CA, possibly lymphedema. He still mows the lawn with a riding mower. The patient is a widower x 30 years and he has step-children from his marriage.   The patient has advanced prostate cancer but again remains functionally independent and is felt to have a reasonably good prognosis, clearly with life expectancy predicted greater than 1 year.  He is followed closely by Dr. Alen Blew with oncology.  Past Medical History:  Diagnosis Date  . Aortic stenosis    Echo 8/18: mild LVH, EF 60-65, no RWMA, mild AS (mean 10, peak 19), MAC, trivial MR, normal RVSF  // Echo 01/2019: EF 60-65, Gr 1 DD, mod AS (mean 22, peak 39)   . DIABETES MELLITUS, TYPE II 07/06/2007  . ED (erectile dysfunction)   . Elevated PSA 01/20/2012  . Elevated PSA   . Heart murmur   . History of nuclear stress test    Myoview 8/18: EF 64, inferobasal thinning, no ischemia, low risk  .  HYPERLIPIDEMIA 07/06/2007  . HYPERTENSION 07/06/2007  . HYPOTHYROIDISM 07/06/2007  . Leg pain    ABIs 8/18:  Normal   . Obesity     Past Surgical History:  Procedure Laterality Date  . COLONOSCOPY    . COLONOSCOPY W/ BIOPSIES AND POLYPECTOMY    . IR IMAGING GUIDED PORT INSERTION  07/05/2018  . POLYPECTOMY      Current Medications: Current Meds  Medication Sig  . aspirin EC 81 MG tablet Take 81 mg by mouth daily.  Marland Kitchen atorvastatin (LIPITOR) 40 MG tablet TAKE 1 TABLET EVERY DAY (Patient taking differently: Take 40 mg by mouth daily. TAKE 1 TABLET EVERY DAY)  . cholecalciferol (VITAMIN D3) 25 MCG (1000 UT) tablet Take 1,000 Units by mouth daily.  . cloNIDine (CATAPRES) 0.2 MG tablet TAKE 2 TABLETS EVERY MORNING  AND TAKE 2 TABLETS EVERY EVENING (Patient taking differently: Take 0.4 mg by mouth 2 (two) times daily. TAKE 2 TABLETS EVERY MORNING  AND TAKE 2 TABLETS EVERY EVENING)  . doxazosin (CARDURA) 8 MG tablet TAKE 1 TABLET AT BEDTIME (Patient taking differently: Take 8 mg by mouth at bedtime. TAKE 1 TABLET AT BEDTIME)  . fosinopril (MONOPRIL) 40 MG tablet TAKE 2 TABLETS BY MOUTH EVERY DAY (Patient taking differently: Take 80 mg by mouth daily. )  . furosemide (LASIX) 40 MG tablet TAKE 2 TABLETS (80 MG TOTAL) BY MOUTH  EVERY MORNING. (Patient taking differently: Take 80 mg by mouth daily. )  . glipiZIDE (GLUCOTROL XL) 10 MG 24 hr tablet TAKE 1 TABLET TWICE DAILY (Patient taking differently: Take 10 mg by mouth in the morning and at bedtime. TAKE 1 TABLET TWICE DAILY)  . labetalol (NORMODYNE) 200 MG tablet TAKE 3 TABLETS EVERY MORNING  AND TAKE 2 TABLETS EVERY EVENING (Patient taking differently: Take 400-600 mg by mouth See admin instructions. TAKE 3 TABLETS EVERY MORNING  AND TAKE 2 TABLETS EVERY EVENING)  . levothyroxine (SYNTHROID) 150 MCG tablet TAKE 1 TABLET EVERY DAY (Patient taking differently: Take 150 mcg by mouth daily before breakfast. TAKE 1 TABLET EVERY DAY)  .  lidocaine-prilocaine (EMLA) cream Apply 1 application topically as needed. (Patient taking differently: Apply 1 application topically as needed (port). )  . metFORMIN (GLUCOPHAGE) 1000 MG tablet TAKE 1 TABLET TWICE DAILY WITH MEALS (Patient taking differently: Take 1,000 mg by mouth 2 (two) times daily with a meal. )  . NIFEdipine (PROCARDIA XL/NIFEDICAL XL) 60 MG 24 hr tablet TAKE 1 TABLET EVERY DAY (Patient taking differently: Take 60 mg by mouth daily. TAKE 1 TABLET EVERY DAY)  . pioglitazone (ACTOS) 15 MG tablet Take 1 tablet (15 mg total) by mouth daily.  . potassium chloride SA (KLOR-CON) 20 MEQ tablet TAKE 1 TABLET TWICE DAILY (Patient taking differently: Take 20 mEq by mouth 2 (two) times daily. TAKE 1 TABLET TWICE DAILY)  . predniSONE (DELTASONE) 5 MG tablet TAKE 1 TABLET (5 MG TOTAL) BY MOUTH DAILY WITH BREAKFAST.  . sildenafil (VIAGRA) 50 MG tablet Take 1 tablet (50 mg total) by mouth daily as needed for erectile dysfunction.  . [DISCONTINUED] abiraterone acetate (ZYTIGA) 250 MG tablet TAKE 4 TABLETS (1,000 MG TOTAL) BY MOUTH DAILY. TAKE ON AN EMPTY STOMACH 1 HOUR BEFORE OR 2 HOURS AFTER A MEAL     Allergies:   Patient has no known allergies.   Social History   Socioeconomic History  . Marital status: Widowed    Spouse name: Not on file  . Number of children: Not on file  . Years of education: Not on file  . Highest education level: Not on file  Occupational History  . Not on file  Tobacco Use  . Smoking status: Former Research scientist (life sciences)  . Smokeless tobacco: Never Used  . Tobacco comment: quit 2005  Vaping Use  . Vaping Use: Never used  Substance and Sexual Activity  . Alcohol use: Yes    Alcohol/week: 21.0 standard drinks    Types: 21 Cans of beer per week  . Drug use: No  . Sexual activity: Not on file  Other Topics Concern  . Not on file  Social History Narrative   He works as a Freight forwarder    Not married    No kids          Social Determinants of Adult nurse Strain:   . Difficulty of Paying Living Expenses:   Food Insecurity:   . Worried About Charity fundraiser in the Last Year:   . Arboriculturist in the Last Year:   Transportation Needs:   . Film/video editor (Medical):   Marland Kitchen Lack of Transportation (Non-Medical):   Physical Activity:   . Days of Exercise per Week:   . Minutes of Exercise per Session:   Stress:   . Feeling of Stress :   Social Connections:   . Frequency of Communication with Friends and Family:   .  Frequency of Social Gatherings with Friends and Family:   . Attends Religious Services:   . Active Member of Clubs or Organizations:   . Attends Archivist Meetings:   Marland Kitchen Marital Status:      Family History: The patient's family history includes Aneurysm in his mother; Breast cancer in his brother, sister, and another family member; Hypertension in an other family member; Prostate cancer in his father. There is no history of Colon cancer.  ROS:   Please see the history of present illness.    All other systems reviewed and are negative.  EKGs/Labs/Other Studies Reviewed:    The following studies were reviewed today: Echo 02/05/2020: IMPRESSIONS    1. The aortic valve is tricuspid. Aortic valve regurgitation is not  visualized. Severe aortic valve stenosis. Aortic valve area, by VTI  measures 0.88 cm. Aortic valve mean gradient measures 44.0 mmHg. Aortic  valve Vmax measures 4.03 m/s.  2. Left ventricular ejection fraction, by estimation, is 65 to 70%. The  left ventricle has normal function. The left ventricle has no regional  wall motion abnormalities. There is mild concentric left ventricular  hypertrophy. Left ventricular diastolic  parameters are consistent with Grade I diastolic dysfunction (impaired  relaxation).  3. Right ventricular systolic function is normal. The right ventricular  size is normal. There is normal pulmonary artery systolic pressure. The  estimated  right ventricular systolic pressure is 83.3 mmHg.  4. Left atrial size was mildly dilated.  5. The mitral valve is grossly normal. Mild mitral valve regurgitation.  No evidence of mitral stenosis.  6. The inferior vena cava is normal in size with greater than 50%  respiratory variability, suggesting right atrial pressure of 3 mmHg.  EKG:  EKG is ordered today.  The ekg ordered today demonstrates sinus rhythm 58 bpm, LVH, nonspecific ST change  Recent Labs: 07/26/2019: TSH 1.92 12/27/2019: ALT 15 02/08/2020: BUN 15; Creatinine, Ser 0.99; Hemoglobin 9.9; Platelets 217; Potassium 3.9; Sodium 144  Recent Lipid Panel    Component Value Date/Time   CHOL 154 07/26/2019 0921   TRIG 113.0 07/26/2019 0921   HDL 48.20 07/26/2019 0921   CHOLHDL 3 07/26/2019 0921   VLDL 22.6 07/26/2019 0921   LDLCALC 83 07/26/2019 0921   LDLDIRECT 105.0 04/28/2016 0756    Physical Exam:    VS:  BP 118/62 (BP Location: Left Arm, Patient Position: Sitting, Cuff Size: Normal)   Pulse (!) 58   Ht _0  (1.702 m)   Wt 215 lb (97.5 kg)   SpO2 98%   BMI 33.67 kg/m     Wt Readings from Last 3 Encounters:  02/08/20 215 lb (97.5 kg)  12/27/19 218 lb 1.6 oz (98.9 kg)  10/23/19 216 lb (98 kg)     GEN:  Well nourished, well developed in no acute distress HEENT: Normal NECK: No JVD; No carotid bruits LYMPHATICS: No lymphadenopathy CARDIAC: RRR, 3/6 harsh late peaking systolic murmur at the right upper sternal border, no diastolic murmur, A2 diminished RESPIRATORY:  Clear to auscultation without rales, wheezing or rhonchi  ABDOMEN: Soft, non-tender, non-distended MUSCULOSKELETAL:  No edema; No deformity  SKIN: Warm and dry NEUROLOGIC:  Alert and oriented x 3 PSYCHIATRIC:  Normal affect   STS Risk Calculator: Risk of Mortality: 1.612% Renal Failure: 3.303% Permanent Stroke: 1.439% Prolonged Ventilation: 6.344% DSW Infection: 0.229% Reoperation: 4.092% Morbidity or Mortality: 11.549% Short Length  of Stay: 35.079% Long Length of Stay: 5.704%  ASSESSMENT:    1. Nonrheumatic aortic valve stenosis  PLAN:    In order of problems listed above:  The patient has severe, stage D1 aortic stenosis with New York Heart Association functional class II limitation of exertional dyspnea and fatigue.  I have discussed the natural history of aortic stenosis with the patient at length today.  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  I suspect reasonable treatment options would include either palliative medical therapy or TAVR.  Considering the patient's functional independence and good quality of life, he would like to proceed with further evaluation and treatment of his aortic stenosis.  I have personally reviewed his echo study which demonstrates vigorous LV systolic function, severe calcification and restriction of the aortic valve with a peak systolic velocity in excess of 4 m/s, peak and mean transaortic valve gradients of 65 and 44 mmHg, respectively, and a dimensionless index of 0.28.  I think these findings are clearly consistent with severe aortic stenosis which also correlates with his physical exam findings and clinical symptoms.  The patient understands that further evaluation will include a right and left heart catheterization as well as CTA studies of the chest, abdomen, and pelvis.  He will require a gated cardiac CTA scan for TAVR planning. I have reviewed the risks, indications, and alternatives to cardiac catheterization, possible angioplasty, and stenting with the patient. Risks include but are not limited to bleeding, infection, vascular injury, stroke, myocardial infection, arrhythmia, kidney injury, radiation-related injury in the case of prolonged fluoroscopy use, emergency cardiac surgery, and death. The patient understands the risks of serious complication  is 1-2 in 2423 with diagnostic cardiac cath and 1-2% or less with angioplasty/stenting.  Once his studies are completed, he will be referred for formal surgical consultation in anticipation of TAVR as part of a multidisciplinary approach to his care.  Medication Adjustments/Labs and Tests Ordered: Current medicines are reviewed at length with the patient today.  Concerns regarding medicines are outlined above.  Orders Placed This Encounter  Procedures  . CT CORONARY MORPH W/CTA COR W/SCORE W/CA W/CM &/OR WO/CM  . CT ANGIO CHEST AORTA W/CM & OR WO/CM  . CT ANGIO ABDOMEN PELVIS  W &/OR WO CONTRAST  . Basic metabolic panel  . CBC with Differential/Platelet  . EKG 12-Lead  . VAS US CAROTID   No orders of the defined types were placed in this encounter.   Patient Instructions  CATHETERIZATION INSTRUCTIONS: You are scheduled for a Cardiac Catheterization on: Wednesday, February 13, 2020 with Dr. Daneen Schick.  1. Please arrive at the [facility] (Main Entrance A) at [facility]: [address] [place], [place] [address] at 11:00 AM (This time is two hours before your procedure to ensure your preparation). Free valet parking service is available. You are allowed ONE visitor in the waiting room during your procedure. Both you and your guest must wear masks. Special note: Every effort is made to have your procedure done on time. Please understand that emergencies sometimes delay scheduled procedures.  2. Diet: Do not eat solid foods after midnight.  You may have clear liquids until 5am upon the day of the procedure.  3. Labs: TODAY! BMET, CBC  4. Medication instructions in preparation for your procedure:  1) HOLD METFORMIN the morning of and 2 days after your cath  2) HOLD GLIPIZIDE the morning of your cath  3) HOLD ACTOS the morning of your cath  4) HOLD LASIX  the morning of your cath  5) MAKE SURE TO TAKE YOUR ASPIRIN the morning of your cath  6) You may take your other meds as directed  with sips of water  5. Plan for one night stay--bring personal belongings. 6. Bring a current list of your medications and current insurance cards. 7. You MUST have a responsible person to drive you home. 8. Someone MUST be with you the first 24 hours after you arrive home or your discharge will be delayed. 9. Please wear clothes that are easy to get on and off and wear slip-on shoes.  Thank you for allowing [place] to care for you!   -- [facility] In[facility] Cardiovascular services     Signed, [name], MD  02/12/2020 10:37 AM    [place]

## 2020-02-09 LAB — CBC WITH DIFFERENTIAL/PLATELET
Basophils Absolute: 0 10*3/uL (ref 0.0–0.2)
Basos: 1 %
EOS (ABSOLUTE): 0.1 10*3/uL (ref 0.0–0.4)
Eos: 2 %
Hematocrit: 30.7 % — ABNORMAL LOW (ref 37.5–51.0)
Hemoglobin: 9.9 g/dL — ABNORMAL LOW (ref 13.0–17.7)
Immature Grans (Abs): 0 10*3/uL (ref 0.0–0.1)
Immature Granulocytes: 0 %
Lymphocytes Absolute: 1.8 10*3/uL (ref 0.7–3.1)
Lymphs: 31 %
MCH: 27.1 pg (ref 26.6–33.0)
MCHC: 32.2 g/dL (ref 31.5–35.7)
MCV: 84 fL (ref 79–97)
Monocytes Absolute: 0.5 10*3/uL (ref 0.1–0.9)
Monocytes: 8 %
Neutrophils Absolute: 3.4 10*3/uL (ref 1.4–7.0)
Neutrophils: 58 %
Platelets: 217 10*3/uL (ref 150–450)
RBC: 3.65 x10E6/uL — ABNORMAL LOW (ref 4.14–5.80)
RDW: 14.6 % (ref 11.6–15.4)
WBC: 5.9 10*3/uL (ref 3.4–10.8)

## 2020-02-09 LAB — BASIC METABOLIC PANEL
BUN/Creatinine Ratio: 15 (ref 10–24)
BUN: 15 mg/dL (ref 8–27)
CO2: 25 mmol/L (ref 20–29)
Calcium: 8.8 mg/dL (ref 8.6–10.2)
Chloride: 104 mmol/L (ref 96–106)
Creatinine, Ser: 0.99 mg/dL (ref 0.76–1.27)
GFR calc Af Amer: 88 mL/min/{1.73_m2} (ref >59)
GFR calc non Af Amer: 76 mL/min/{1.73_m2} (ref >59)
Glucose: 71 mg/dL (ref 65–99)
Potassium: 3.9 mmol/L (ref 3.5–5.2)
Sodium: 144 mmol/L (ref 134–144)

## 2020-02-11 ENCOUNTER — Other Ambulatory Visit: Payer: Self-pay | Admitting: Oncology

## 2020-02-11 DIAGNOSIS — C61 Malignant neoplasm of prostate: Secondary | ICD-10-CM

## 2020-02-12 ENCOUNTER — Telehealth: Payer: Self-pay | Admitting: *Deleted

## 2020-02-12 NOTE — Telephone Encounter (Addendum)
Pt contacted pre-catheterization scheduled at [facility] for: Wednesday February 13, 2020 1 PM Verified arrival time and place: [place] [facility]) at: 11 AM   No solid food after midnight prior to cath, clear liquids until 5 AM day of procedure.  Hold: Lasix/KCl-AM of procedure Metformin-day of procedure and 48 hours post procedure Glipizide-AM of procedure Actos-AM of procedure   Except hold medications AM meds can be  taken pre-cath with sips of water including: ASA 81 mg   Confirmed patient has responsible adult to drive home post procedure and observe 24 hours after arriving home: yes  You are allowed ONE visitor in the waiting room during your procedure. Both you and your visitor must wear masks.      COVID-19 Pre-Screening Questions:  . In the past 7 to 10 days have you had a new cough, shortness of breath, headache, congestion, fever (100 or greater) unexplained body aches, new sore throat, or sudden loss of taste or sense of smell? no . In the past 7 to 10 days have you been around anyone with known Covid 19? no   Reviewed procedure/mask/visitor instructions, COVID-19 screening questions with patient.

## 2020-02-13 ENCOUNTER — Ambulatory Visit (HOSPITAL_COMMUNITY)
Admission: RE | Admit: 2020-02-13 | Discharge: 2020-02-13 | Disposition: A | Discharge status: Home / Self Care | Payer: Medicare Other | Attending: Interventional Cardiology | Admitting: Interventional Cardiology

## 2020-02-13 ENCOUNTER — Other Ambulatory Visit: Payer: Self-pay

## 2020-02-13 ENCOUNTER — Encounter (HOSPITAL_COMMUNITY)
Admission: RE | Disposition: A | Discharge status: Home / Self Care | Payer: Self-pay | Source: Home / Self Care | Attending: Interventional Cardiology

## 2020-02-13 DIAGNOSIS — Z79899 Other long term (current) drug therapy: Secondary | ICD-10-CM | POA: Diagnosis not present

## 2020-02-13 DIAGNOSIS — E669 Obesity, unspecified: Secondary | ICD-10-CM | POA: Diagnosis not present

## 2020-02-13 DIAGNOSIS — I350 Nonrheumatic aortic (valve) stenosis: Secondary | ICD-10-CM

## 2020-02-13 DIAGNOSIS — F1020 Alcohol dependence, uncomplicated: Secondary | ICD-10-CM | POA: Diagnosis present

## 2020-02-13 DIAGNOSIS — Z87891 Personal history of nicotine dependence: Secondary | ICD-10-CM | POA: Diagnosis not present

## 2020-02-13 DIAGNOSIS — Z7984 Long term (current) use of oral hypoglycemic drugs: Secondary | ICD-10-CM | POA: Insufficient documentation

## 2020-02-13 DIAGNOSIS — Z7982 Long term (current) use of aspirin: Secondary | ICD-10-CM | POA: Insufficient documentation

## 2020-02-13 DIAGNOSIS — E039 Hypothyroidism, unspecified: Secondary | ICD-10-CM | POA: Diagnosis not present

## 2020-02-13 DIAGNOSIS — E785 Hyperlipidemia, unspecified: Secondary | ICD-10-CM | POA: Diagnosis not present

## 2020-02-13 DIAGNOSIS — I2510 Atherosclerotic heart disease of native coronary artery without angina pectoris: Secondary | ICD-10-CM

## 2020-02-13 DIAGNOSIS — I10 Essential (primary) hypertension: Secondary | ICD-10-CM | POA: Diagnosis not present

## 2020-02-13 DIAGNOSIS — Z6833 Body mass index (BMI) 33.0-33.9, adult: Secondary | ICD-10-CM | POA: Insufficient documentation

## 2020-02-13 DIAGNOSIS — E119 Type 2 diabetes mellitus without complications: Secondary | ICD-10-CM | POA: Diagnosis not present

## 2020-02-13 HISTORY — PX: RIGHT/LEFT HEART CATH AND CORONARY ANGIOGRAPHY: CATH118266

## 2020-02-13 LAB — POCT I-STAT EG7
Acid-Base Excess: 2 mmol/L (ref 0.0–2.0)
Acid-Base Excess: 2 mmol/L (ref 0.0–2.0)
Acid-Base Excess: 3 mmol/L — ABNORMAL HIGH (ref 0.0–2.0)
Bicarbonate: 25.6 mmol/L (ref 20.0–28.0)
Bicarbonate: 26.5 mmol/L (ref 20.0–28.0)
Bicarbonate: 26.5 mmol/L (ref 20.0–28.0)
Calcium, Ion: 1.12 mmol/L — ABNORMAL LOW (ref 1.15–1.40)
Calcium, Ion: 1.15 mmol/L (ref 1.15–1.40)
Calcium, Ion: 1.17 mmol/L (ref 1.15–1.40)
HCT: 27 % — ABNORMAL LOW (ref 39.0–52.0)
HCT: 28 % — ABNORMAL LOW (ref 39.0–52.0)
HCT: 28 % — ABNORMAL LOW (ref 39.0–52.0)
Hemoglobin: 9.2 g/dL — ABNORMAL LOW (ref 13.0–17.0)
Hemoglobin: 9.5 g/dL — ABNORMAL LOW (ref 13.0–17.0)
Hemoglobin: 9.5 g/dL — ABNORMAL LOW (ref 13.0–17.0)
O2 Saturation: 75 %
O2 Saturation: 78 %
O2 Saturation: 80 %
Potassium: 2.7 mmol/L — CL (ref 3.5–5.1)
Potassium: 2.7 mmol/L — CL (ref 3.5–5.1)
Potassium: 2.8 mmol/L — ABNORMAL LOW (ref 3.5–5.1)
Sodium: 146 mmol/L — ABNORMAL HIGH (ref 135–145)
Sodium: 146 mmol/L — ABNORMAL HIGH (ref 135–145)
Sodium: 146 mmol/L — ABNORMAL HIGH (ref 135–145)
TCO2: 27 mmol/L (ref 22–32)
TCO2: 28 mmol/L (ref 22–32)
TCO2: 28 mmol/L (ref 22–32)
pCO2, Ven: 34.6 mmHg — ABNORMAL LOW (ref 44.0–60.0)
pCO2, Ven: 36 mmHg — ABNORMAL LOW (ref 44.0–60.0)
pCO2, Ven: 38.5 mmHg — ABNORMAL LOW (ref 44.0–60.0)
pH, Ven: 7.446 — ABNORMAL HIGH (ref 7.250–7.430)
pH, Ven: 7.474 — ABNORMAL HIGH (ref 7.250–7.430)
pH, Ven: 7.477 — ABNORMAL HIGH (ref 7.250–7.430)
pO2, Ven: 37 mmHg (ref 32.0–45.0)
pO2, Ven: 39 mmHg (ref 32.0–45.0)
pO2, Ven: 43 mmHg (ref 32.0–45.0)

## 2020-02-13 LAB — POCT I-STAT 7, (LYTES, BLD GAS, ICA,H+H)
Acid-Base Excess: 3 mmol/L — ABNORMAL HIGH (ref 0.0–2.0)
Bicarbonate: 26.7 mmol/L (ref 20.0–28.0)
Calcium, Ion: 1.17 mmol/L (ref 1.15–1.40)
HCT: 27 % — ABNORMAL LOW (ref 39.0–52.0)
Hemoglobin: 9.2 g/dL — ABNORMAL LOW (ref 13.0–17.0)
O2 Saturation: 99 %
Potassium: 2.7 mmol/L — CL (ref 3.5–5.1)
Sodium: 144 mmol/L (ref 135–145)
TCO2: 28 mmol/L (ref 22–32)
pCO2 arterial: 38.8 mmHg (ref 32.0–48.0)
pH, Arterial: 7.447 (ref 7.350–7.450)
pO2, Arterial: 117 mmHg — ABNORMAL HIGH (ref 83.0–108.0)

## 2020-02-13 LAB — GLUCOSE, CAPILLARY: Glucose-Capillary: 103 mg/dL — ABNORMAL HIGH (ref 70–99)

## 2020-02-13 SURGERY — RIGHT/LEFT HEART CATH AND CORONARY ANGIOGRAPHY
Anesthesia: LOCAL

## 2020-02-13 MED ORDER — OXYCODONE HCL 5 MG PO TABS: 5.0000 mg | ORAL_TABLET | ORAL | Status: DC | PRN

## 2020-02-13 MED ORDER — POTASSIUM CHLORIDE CRYS ER 20 MEQ PO TBCR
EXTENDED_RELEASE_TABLET | ORAL | Status: AC
  Filled 2020-02-13: qty 2

## 2020-02-13 MED ORDER — HEPARIN SODIUM (PORCINE) 1000 UNIT/ML IJ SOLN
INTRAMUSCULAR | Status: AC
  Filled 2020-02-13: qty 1

## 2020-02-13 MED ORDER — VERAPAMIL HCL 2.5 MG/ML IV SOLN
INTRAVENOUS | Status: AC
  Filled 2020-02-13: qty 2

## 2020-02-13 MED ORDER — IOHEXOL 350 MG/ML SOLN
INTRAVENOUS | Status: DC | PRN
  Administered 2020-02-13: 60 mL

## 2020-02-13 MED ORDER — LIDOCAINE HCL (PF) 1 % IJ SOLN
INTRAMUSCULAR | Status: AC
  Filled 2020-02-13: qty 30

## 2020-02-13 MED ORDER — SODIUM CHLORIDE 0.9% FLUSH: 3.0000 mL | INTRAVENOUS | Status: DC | PRN

## 2020-02-13 MED ORDER — ONDANSETRON HCL 4 MG/2ML IJ SOLN: 4.0000 mg | Freq: Four times a day (QID) | INTRAMUSCULAR | Status: DC | PRN

## 2020-02-13 MED ORDER — FENTANYL CITRATE (PF) 100 MCG/2ML IJ SOLN
INTRAMUSCULAR | Status: DC | PRN
  Administered 2020-02-13: 25 ug via INTRAVENOUS

## 2020-02-13 MED ORDER — MIDAZOLAM HCL 2 MG/2ML IJ SOLN
INTRAMUSCULAR | Status: AC
  Filled 2020-02-13: qty 2

## 2020-02-13 MED ORDER — HEPARIN (PORCINE) IN NACL 1000-0.9 UT/500ML-% IV SOLN
INTRAVENOUS | Status: DC | PRN
  Administered 2020-02-13 (×2): 500 mL

## 2020-02-13 MED ORDER — HYDRALAZINE HCL 20 MG/ML IJ SOLN: 10.0000 mg | INTRAMUSCULAR | Status: DC | PRN

## 2020-02-13 MED ORDER — SODIUM CHLORIDE 0.9 % IV SOLN: 250.0000 mL | INTRAVENOUS | Status: DC | PRN

## 2020-02-13 MED ORDER — SODIUM CHLORIDE 0.9 % WEIGHT BASED INFUSION: 1.0000 mL/kg/h | INTRAVENOUS | Status: DC

## 2020-02-13 MED ORDER — LIDOCAINE HCL (PF) 1 % IJ SOLN
INTRAMUSCULAR | Status: DC | PRN
  Administered 2020-02-13 (×2): 2 mL

## 2020-02-13 MED ORDER — LABETALOL HCL 5 MG/ML IV SOLN: 10.0000 mg | INTRAVENOUS | Status: DC | PRN

## 2020-02-13 MED ORDER — SODIUM CHLORIDE 0.9 % WEIGHT BASED INFUSION
3.0000 mL/kg/h | INTRAVENOUS | Status: DC
  Administered 2020-02-13: 3 mL/kg/h via INTRAVENOUS

## 2020-02-13 MED ORDER — ACETAMINOPHEN 325 MG PO TABS: 650.0000 mg | ORAL_TABLET | ORAL | Status: DC | PRN

## 2020-02-13 MED ORDER — ASPIRIN 81 MG PO CHEW: 81.0000 mg | CHEWABLE_TABLET | ORAL | Status: DC

## 2020-02-13 MED ORDER — SODIUM CHLORIDE 0.9% FLUSH: 3.0000 mL | Freq: Two times a day (BID) | INTRAVENOUS | Status: DC

## 2020-02-13 MED ORDER — HEPARIN (PORCINE) IN NACL 1000-0.9 UT/500ML-% IV SOLN
INTRAVENOUS | Status: AC
  Filled 2020-02-13: qty 1000

## 2020-02-13 MED ORDER — SODIUM CHLORIDE 0.9 % IV SOLN: INTRAVENOUS | Status: DC

## 2020-02-13 MED ORDER — MIDAZOLAM HCL 2 MG/2ML IJ SOLN
INTRAMUSCULAR | Status: DC | PRN
  Administered 2020-02-13: 1 mg via INTRAVENOUS

## 2020-02-13 MED ORDER — HEPARIN SODIUM (PORCINE) 1000 UNIT/ML IJ SOLN
INTRAMUSCULAR | Status: DC | PRN
  Administered 2020-02-13: 5000 [IU] via INTRAVENOUS

## 2020-02-13 MED ORDER — VERAPAMIL HCL 2.5 MG/ML IV SOLN
INTRAVENOUS | Status: DC | PRN
  Administered 2020-02-13: 10 mL via INTRA_ARTERIAL

## 2020-02-13 MED ORDER — POTASSIUM CHLORIDE CRYS ER 10 MEQ PO TBCR
EXTENDED_RELEASE_TABLET | ORAL | Status: DC | PRN
  Administered 2020-02-13: 20 meq via ORAL

## 2020-02-13 MED ORDER — FENTANYL CITRATE (PF) 100 MCG/2ML IJ SOLN
INTRAMUSCULAR | Status: AC
  Filled 2020-02-13: qty 2

## 2020-02-13 SURGICAL SUPPLY — 14 items
CATH 5FR JL3.5 JR4 ANG PIG MP (CATHETERS) ×1 IMPLANT
CATH SWAN GANZ 7F STRAIGHT (CATHETERS) ×1 IMPLANT
DEVICE RAD COMP TR BAND LRG (VASCULAR PRODUCTS) ×1 IMPLANT
GLIDESHEATH SLEND A-KIT 6F 22G (SHEATH) ×1 IMPLANT
GLIDESHEATH SLENDER 7FR .021G (SHEATH) ×1 IMPLANT
GUIDEWIRE .025 260CM (WIRE) ×1 IMPLANT
GUIDEWIRE INQWIRE 1.5J.035X260 (WIRE) IMPLANT
INQWIRE 1.5J .035X260CM (WIRE) ×2
KIT HEART LEFT (KITS) ×2 IMPLANT
PACK CARDIAC CATHETERIZATION (CUSTOM PROCEDURE TRAY) ×2 IMPLANT
SHEATH PROBE COVER 6X72 (BAG) ×1 IMPLANT
TRANSDUCER W/STOPCOCK (MISCELLANEOUS) ×2 IMPLANT
TUBING CIL FLEX 10 FLL-RA (TUBING) ×2 IMPLANT
WIRE EMERALD ST .035X150CM (WIRE) ×1 IMPLANT

## 2020-02-13 NOTE — Discharge Instructions (Signed)
Drink plenty of fluids for 48 hours and keep wrist elevated at heart level for 24 hours Hold Metformin for 48 hours. May resume 6/26  Radial Site Care   This sheet gives you information about how to care for yourself after your procedure. Your health care provider may also give you more specific instructions. If you have problems or questions, contact your health care provider. What can I expect after the procedure? After the procedure, it is common to have:  Bruising and tenderness at the catheter insertion area. Follow these instructions at home: Medicines  Take over-the-counter and prescription medicines only as told by your health care provider. Insertion site care 1. Follow instructions from your health care provider about how to take care of your insertion site. Make sure you: ? Wash your hands with soap and water before you change your bandage (dressing). If soap and water are not available, use hand sanitizer. ? Remove your dressing as told by your health care provider. In 24 hours 2. Check your insertion site every day for signs of infection. Check for: ? Redness, swelling, or pain. ? Fluid or blood. ? Pus or a bad smell. ? Warmth. 3. Do not take baths, swim, or use a hot tub until your health care provider approves. 4. You may shower 24-48 hours after the procedure, or as directed by your health care provider. ? Remove the dressing and gently wash the site with plain soap and water. ? Pat the area dry with a clean towel. ? Do not rub the site. That could cause bleeding. 5. Do not apply powder or lotion to the site. Activity   1. For 24 hours after the procedure, or as directed by your health care provider: ? Do not flex or bend the affected arm. ? Do not push or pull heavy objects with the affected arm. ? Do not drive yourself home from the hospital or clinic. You may drive 24 hours after the procedure unless your health care provider tells you not to. ? Do not operate  machinery or power tools. 2. Do not lift anything that is heavier than 10 lb (4.5 kg), or the limit that you are told, until your health care provider says that it is safe.  For 4 days 3. Ask your health care provider when it is okay to: ? Return to work or school. ? Resume usual physical activities or sports. ? Resume sexual activity. General instructions  If the catheter site starts to bleed, raise your arm and put firm pressure on the site. If the bleeding does not stop, get help right away. This is a medical emergency.  If you went home on the same day as your procedure, a responsible adult should be with you for the first 24 hours after you arrive home.  Keep all follow-up visits as told by your health care provider. This is important. Contact a health care provider if:  You have a fever.  You have redness, swelling, or yellow drainage around your insertion site. Get help right away if:  You have unusual pain at the radial site.  The catheter insertion area swells very fast.  The insertion area is bleeding, and the bleeding does not stop when you hold steady pressure on the area.  Your arm or hand becomes pale, cool, tingly, or numb. These symptoms may represent a serious problem that is an emergency. Do not wait to see if the symptoms will go away. Get medical help right away. Call your  local emergency services (911 in the [place] Do not drive yourself to the hospital. Summary  After the procedure, it is common to have bruising and tenderness at the site.  Follow instructions from your health care provider about how to take care of your radial site wound. Check the wound every day for signs of infection.  Do not lift anything that is heavier than 10 lb (4.5 kg), or the limit that you are told, until your health care provider says that it is safe. This information is not intended to replace advice given to you by your health care provider. Make sure you discuss any questions  you have with your health care provider. Document Revised: 09/14/2017 Document Reviewed: 09/14/2017 Elsevier Patient Education  2020 [employer].

## 2020-02-13 NOTE — Research (Signed)
PHDE Informed Consent   Subject Name: Matthew Serrano.  Subject met inclusion and exclusion criteria.  The informed consent form, study requirements and expectations were reviewed with the subject and questions and concerns were addressed prior to the signing of the consent form.  The subject verbalized understanding of the trail requirements.  The subject agreed to participate in the PHDE trial and signed the informed consent.  The informed consent was obtained prior to performance of any protocol-specific procedures for the subject.  A copy of the signed informed consent was given to the subject and a copy was placed in the subject's medical record.  Neva Seat 02/13/2020, 11:45 AM

## 2020-02-13 NOTE — CV Procedure (Signed)
   Left and right heart cath with coronary angiography via right radial and right antecubital vein.  Real-time vascular ultrasound was used for arterial access.  Widely patent coronary arteries.  Aortic stenosis with mean gradient 23 mmHg.  Heavy leaflet calcification and reduced motion.  Calculated valve area 2.3 cm.  Cardiac output is unexpectedly high at 10.3 L/min with a pulmonary artery saturation of 79 to 80%.  Right atrial saturation 75%.  LVEDP (11 mmHg), pulmonary artery pressure (29/8 mmHg), and capillary wedge (8 mmHg mean) are normal.

## 2020-02-13 NOTE — Interval H&P Note (Signed)
Cath Lab Visit (complete for each Cath Lab visit)  Clinical Evaluation Leading to the Procedure:   ACS: No.  Non-ACS:    Anginal Classification: CCS III  Anti-ischemic medical therapy: Minimal Therapy (1 class of medications)  Non-Invasive Test Results: Intermediate-risk stress test findings: cardiac mortality 1-3%/year  Prior CABG: No previous CABG      History and Physical Interval Note:  02/13/2020 1:26 PM  Matthew Serrano.  has presented today for surgery, with the diagnosis of aortic stenosis.  The various methods of treatment have been discussed with the patient and family. After consideration of risks, benefits and other options for treatment, the patient has consented to  Procedure(s): RIGHT/LEFT HEART CATH AND CORONARY ANGIOGRAPHY (N/A) as a surgical intervention.  The patient's history has been reviewed, patient examined, no change in status, stable for surgery.  I have reviewed the patient's chart and labs.  Questions were answered to the patient's satisfaction.     [name]

## 2020-02-14 ENCOUNTER — Encounter (HOSPITAL_COMMUNITY): Payer: Self-pay | Admitting: Interventional Cardiology

## 2020-02-15 ENCOUNTER — Encounter (HOSPITAL_COMMUNITY): Payer: Medicare Other

## 2020-02-15 ENCOUNTER — Encounter: Payer: Self-pay | Admitting: Cardiovascular Disease

## 2020-02-18 MED FILL — ABIRATERONE ACETATE 250 MG: 250 | 30 days supply | Qty: 120 | Fill #0

## 2020-02-20 ENCOUNTER — Other Ambulatory Visit: Payer: Self-pay

## 2020-02-20 ENCOUNTER — Ambulatory Visit: Payer: Medicare Other | Attending: Cardiovascular Disease | Admitting: Physical Therapy

## 2020-02-20 ENCOUNTER — Ambulatory Visit (HOSPITAL_COMMUNITY)
Admission: RE | Admit: 2020-02-20 | Discharge: 2020-02-20 | Disposition: A | Discharge status: Home / Self Care | Payer: Medicare Other | Attending: Cardiovascular Disease | Admitting: Cardiovascular Disease

## 2020-02-20 ENCOUNTER — Encounter (HOSPITAL_COMMUNITY): Payer: Self-pay

## 2020-02-20 ENCOUNTER — Ambulatory Visit (HOSPITAL_BASED_OUTPATIENT_CLINIC_OR_DEPARTMENT_OTHER)
Admission: RE | Admit: 2020-02-20 | Discharge: 2020-02-20 | Disposition: A | Discharge status: Home / Self Care | Payer: Medicare Other | Source: Home / Self Care | Attending: Cardiovascular Disease | Admitting: Cardiovascular Disease

## 2020-02-20 ENCOUNTER — Encounter: Payer: Self-pay | Admitting: Physical Therapy

## 2020-02-20 DIAGNOSIS — Z01818 Encounter for other preprocedural examination: Secondary | ICD-10-CM | POA: Diagnosis not present

## 2020-02-20 DIAGNOSIS — I350 Nonrheumatic aortic (valve) stenosis: Secondary | ICD-10-CM

## 2020-02-20 DIAGNOSIS — I700 Atherosclerosis of aorta: Secondary | ICD-10-CM | POA: Diagnosis not present

## 2020-02-20 DIAGNOSIS — R262 Difficulty in walking, not elsewhere classified: Secondary | ICD-10-CM | POA: Diagnosis not present

## 2020-02-20 MED ORDER — IOHEXOL 350 MG/ML SOLN
100.0000 mL | Freq: Once | INTRAVENOUS | Status: AC | PRN
  Administered 2020-02-20: 100 mL via INTRAVENOUS

## 2020-02-20 NOTE — Therapy (Signed)
[place], [place], [address] Phone: [phone]   Fax:  [phone]  Physical Therapy Evaluation/TAVR  Patient Details  Name: Matthew Serrano. MRN: [id number] Date of Birth: November 01, 1945 Referring Provider (PT): [name], MD   Encounter Date: 02/20/2020   PT End of Session - 02/20/20 1203    Visit Number 1    Authorization Type MCR    PT Start Time 1159    PT Stop Time 1225    PT Time Calculation (min) 26 min    Activity Tolerance Patient tolerated treatment well    Behavior During Therapy [facility] for tasks assessed/performed           Past Medical History:  Diagnosis Date  . Aortic stenosis    Echo 8/18: mild LVH, EF 60-65, no RWMA, mild AS (mean 10, peak 19), MAC, trivial MR, normal RVSF  // Echo 01/2019: EF 60-65, Gr 1 DD, mod AS (mean 22, peak 39)   . DIABETES MELLITUS, TYPE II 07/06/2007  . ED (erectile dysfunction)   . Elevated PSA 01/20/2012  . Elevated PSA   . Heart murmur   . History of nuclear stress test    Myoview 8/18: EF 64, inferobasal thinning, no ischemia, low risk  . HYPERLIPIDEMIA 07/06/2007  . HYPERTENSION 07/06/2007  . HYPOTHYROIDISM 07/06/2007  . Leg pain    ABIs 8/18:  Normal   . Obesity     Past Surgical History:  Procedure Laterality Date  . COLONOSCOPY    . COLONOSCOPY W/ BIOPSIES AND POLYPECTOMY    . IR IMAGING GUIDED PORT INSERTION  07/05/2018  . POLYPECTOMY    . RIGHT/LEFT HEART CATH AND CORONARY ANGIOGRAPHY N/A 02/13/2020   Procedure: RIGHT/LEFT HEART CATH AND CORONARY ANGIOGRAPHY;  Surgeon: Belva Crome, MD;  Location: [place] [facility];  Service: Cardiovascular;  Laterality: N/A;    There were no vitals filed for this visit.    Subjective Assessment - 02/20/20 1201    Subjective Been feeling a lot dizzy lately, thought it was coming from meds. A little SOB like when I am outside weed eating. Occasional back pain from injections for treatment of  prostate cancer.    Currently in Pain? Yes    Pain Location Teeth              [facility] PT Assessment - 02/20/20 0001      Assessment   Medical Diagnosis severe aortic stenosis    Referring Provider (PT) [name], MD    Hand Dominance Right      Precautions   Precaution Comments prostate CA      Balance Screen   Has the patient fallen in the past 6 months No      [place] residence    Living Arrangements Alone    Additional Comments stairs in home      Prior Function   Level of [place] Retired      Associate Professor   Overall Cognitive Status Within Functional Limits for tasks assessed      Observation/Other Assessments   Focus on Therapeutic Outcomes (FOTO)  n/a TAVR      Sensation   Additional Comments WFL      ROM / Strength   AROM / PROM / Strength AROM;Strength      AROM   Overall AROM Comments WFL      Strength   Overall Strength Comments UE gross  4+/5, LE gross 5/5    Strength Assessment Site Hand    Right/Left hand Right;Left    Right Hand Grip (lbs) 75    Left Hand Grip (lbs) 75            [facility] Pre-Surgical Assessment - 02/20/20 0001    5 Meter Walk Test- trial 1 3 sec    5 Meter Walk Test- trial 2 3 sec.     5 Meter Walk Test- trial 3 3 sec.    5 meter walk test average 3 sec    4 Stage Balance Test Position 4    Sit To Stand Test- trial 1 14 sec.    6 Minute Walk- Baseline yes    BP (mmHg) 117/57    HR (bpm) 61    02 Sat (%RA) 98 %    Modified Borg Scale for Dyspnea 0- Nothing at all    Perceived Rate of Exertion (Borg) 6-    6 Minute Walk Post Test yes    BP (mmHg) 134/71    HR (bpm) 82    02 Sat (%RA) 99 %    Modified Borg Scale for Dyspnea 9- Extremely severe    Perceived Rate of Exertion (Borg) 13- Somewhat hard    Aerobic Endurance Distance Walked 1160    Endurance additional comments 32% limited compared to age-related normative values                     Objective measurements completed on examination: See above findings.                            Plan - 02/20/20 1225    PT Frequency One time visit    Consulted and Agree with Plan of Care Patient           Clinical Impression Statement: Pt is a 74 yo M presenting to OP PT for evaluation prior to possible TAVR surgery due to severe aortic stenosis. Pt reports onset of mild SOB and dizziness with knowledge of heart murmur about 3 years ago. Symptoms are  limiting functional endurance. Pt presents with WFL ROM and strength and has occasional low back musculoskeletal pain with injections for prostate CA and currently has tooth pain.  Pt ambulated 740 feet in 2:30 before requesting a standingrest beak lasting 30s. At time of rest, patient's HR was 111 bpm and O2 was 95% on room air. Pt reported 7/10 shortness of breath on modified scale for dyspnea. Pt able to resume after rest and ambulate an additional 420 feet. Pt ambulated a total of 1160 feet in 6 minute walk and reported 9/10 SOB on modified scale for dyspena and 13/20 RPE on Borg's perceived exertion and pain scale at the end of the walk. During the 6 minute walk test, patient's HR increased to 111 BPM and O2 saturation decreased to 95%. Based on the Short Physical Performance Battery, patient has a frailty rating of 10/12 with </= 5/12 considered frail.  Visit Diagnosis: Difficulty in walking, not elsewhere classified     Problem List Patient Active Problem List   Diagnosis Date Noted  . Malignant neoplasm of prostate ([place]) 06/23/2018  . Goals of care, counseling/discussion 06/23/2018  . Aortic stenosis   . Left inguinal hernia 09/04/2015  . Alcoholism ([place]) 06/10/2014  . Anemia of chronic disease 05/23/2014  . Elevated PSA 01/20/2012  . HEMORRHAGE OF RECTUM AND ANUS 12/15/2008  . ERECTILE DYSFUNCTION,  MILD 12/14/2007  . SEBORRHEIC KERATOSIS, INFLAMED 08/03/2007  . NEOPLASM, SKIN,  UNCERTAIN BEHAVIOR 29/47/6546  . Hypothyroidism 07/06/2007  . Diabetes mellitus ([place]) 07/06/2007  . Hyperlipidemia 07/06/2007  . Essential hypertension 07/06/2007    Terricka Onofrio C. Roslind Michaux PT, DPT 02/20/20 12:28 PM   [facility] [address] [place], [place], [address] Phone: [phone]   Fax:  [phone]  Name: Matthew Serrano. MRN: [id number] Date of Birth: 07-11-1946

## 2020-02-22 ENCOUNTER — Ambulatory Visit (INDEPENDENT_AMBULATORY_CARE_PROVIDER_SITE_OTHER): Payer: Medicare Other | Admitting: Adult Health

## 2020-02-22 ENCOUNTER — Other Ambulatory Visit: Payer: Self-pay

## 2020-02-22 ENCOUNTER — Encounter: Payer: Self-pay | Admitting: Adult Health

## 2020-02-22 VITALS — BP 110/70 | Temp 97.3°F | Wt 214.0 lb

## 2020-02-22 DIAGNOSIS — E119 Type 2 diabetes mellitus without complications: Secondary | ICD-10-CM | POA: Diagnosis not present

## 2020-02-22 LAB — POCT GLYCOSYLATED HEMOGLOBIN (HGB A1C): HbA1c, POC (controlled diabetic range): 6.3 % (ref 0.0–7.0)

## 2020-02-22 NOTE — Progress Notes (Signed)
Subjective:    Patient ID: Matthew Serrano., male    DOB: Dec 23, 1945, 74 y.o.   MRN: [id number]  HPI  74 year old male who  has a past medical history of Aortic stenosis, DIABETES MELLITUS, TYPE II (07/06/2007), ED (erectile dysfunction), Elevated PSA (01/20/2012), Elevated PSA, Heart murmur, History of nuclear stress test, HYPERLIPIDEMIA (07/06/2007), HYPERTENSION (07/06/2007), HYPOTHYROIDISM (07/06/2007), Leg pain, and Obesity.   He presents to the office today for 20-monthfollow-up regarding diabetes mellitus.  He is currently maintained on Metformin 1000 mg twice a day, glipizide 10 mg ER daily and Actos 15 mg daily.  He does monitor his blood sugar at home and reports readings between 80 and 200 but for the most part his blood sugars have been in the 120s to 140s.  He denies any episodes of hypoglycemia and has not noticed any side effects from his medications.  Lab Results  Component Value Date   HGBA1C 6.2 10/23/2019    Review of Systems See HPI   Past Medical History:  Diagnosis Date  . Aortic stenosis    Echo 8/18: mild LVH, EF 60-65, no RWMA, mild AS (mean 10, peak 19), MAC, trivial MR, normal RVSF  // Echo 01/2019: EF 60-65, Gr 1 DD, mod AS (mean 22, peak 39)   . DIABETES MELLITUS, TYPE II 07/06/2007  . ED (erectile dysfunction)   . Elevated PSA 01/20/2012  . Elevated PSA   . Heart murmur   . History of nuclear stress test    Myoview 8/18: EF 64, inferobasal thinning, no ischemia, low risk  . HYPERLIPIDEMIA 07/06/2007  . HYPERTENSION 07/06/2007  . HYPOTHYROIDISM 07/06/2007  . Leg pain    ABIs 8/18:  Normal   . Obesity     Social History   Socioeconomic History  . Marital status: Widowed    Spouse name: Not on file  . Number of children: Not on file  . Years of education: Not on file  . Highest education level: Not on file  Occupational History  . Not on file  Tobacco Use  . Smoking status: Former SResearch scientist (life sciences) . Smokeless tobacco: Never Used  . Tobacco  comment: quit 2005  Vaping Use  . Vaping Use: Never used  Substance and Sexual Activity  . Alcohol use: Yes    Alcohol/week: 21.0 standard drinks    Types: 21 Cans of beer per week  . Drug use: No  . Sexual activity: Not on file  Other Topics Concern  . Not on file  Social History Narrative   He works as a fFreight forwarder   Not married    No kids          Social Determinants of HRadio broadcast assistantStrain:   . Difficulty of Paying Living Expenses:   Food Insecurity:   . Worried About RCharity fundraiserin the Last Year:   . RArboriculturistin the Last Year:   Transportation Needs:   . LFilm/video editor(Medical):   .Marland KitchenLack of Transportation (Non-Medical):   Physical Activity:   . Days of Exercise per Week:   . Minutes of Exercise per Session:   Stress:   . Feeling of Stress :   Social Connections:   . Frequency of Communication with Friends and Family:   . Frequency of Social Gatherings with Friends and Family:   . Attends Religious Services:   . Active Member of Clubs or Organizations:   .  Attends Archivist Meetings:   Marland Kitchen Marital Status:   Intimate Partner Violence:   . Fear of Current or Ex-Partner:   . Emotionally Abused:   Marland Kitchen Physically Abused:   . Sexually Abused:     Past Surgical History:  Procedure Laterality Date  . COLONOSCOPY    . COLONOSCOPY W/ BIOPSIES AND POLYPECTOMY    . IR IMAGING GUIDED PORT INSERTION  07/05/2018  . POLYPECTOMY    . RIGHT/LEFT HEART CATH AND CORONARY ANGIOGRAPHY N/A 02/13/2020   Procedure: RIGHT/LEFT HEART CATH AND CORONARY ANGIOGRAPHY;  Surgeon: Belva Crome, MD;  Location: [place] [facility];  Service: Cardiovascular;  Laterality: N/A;    Family History  Problem Relation Age of Onset  . Hypertension Other   . Breast cancer Sister   . Breast cancer Other        family history  . Breast cancer Brother        prostate ca  . Aneurysm Mother        Brain aneurysm   . Prostate cancer Father   .  Colon cancer Neg Hx     No Known Allergies  Current Outpatient Medications on File Prior to Visit  Medication Sig Dispense Refill  . abiraterone acetate (ZYTIGA) 250 MG tablet TAKE 4 TABLETS (1,000 MG TOTAL) BY MOUTH DAILY. TAKE ON AN EMPTY STOMACH 1 HOUR BEFORE OR 2 HOURS AFTER A MEAL 120 tablet 0  . aspirin EC 81 MG tablet Take 81 mg by mouth daily.    Marland Kitchen atorvastatin (LIPITOR) 40 MG tablet TAKE 1 TABLET EVERY DAY (Patient taking differently: Take 40 mg by mouth daily. TAKE 1 TABLET EVERY DAY) 90 tablet 2  . cholecalciferol (VITAMIN D3) 25 MCG (1000 UT) tablet Take 1,000 Units by mouth daily.    . cloNIDine (CATAPRES) 0.2 MG tablet TAKE 2 TABLETS EVERY MORNING  AND TAKE 2 TABLETS EVERY EVENING (Patient taking differently: Take 0.4 mg by mouth 2 (two) times daily. TAKE 2 TABLETS EVERY MORNING  AND TAKE 2 TABLETS EVERY EVENING) 360 tablet 3  . doxazosin (CARDURA) 8 MG tablet TAKE 1 TABLET AT BEDTIME (Patient taking differently: Take 8 mg by mouth at bedtime. TAKE 1 TABLET AT BEDTIME) 90 tablet 3  . fosinopril (MONOPRIL) 40 MG tablet TAKE 2 TABLETS BY MOUTH EVERY DAY (Patient taking differently: Take 80 mg by mouth daily. ) 180 tablet 1  . furosemide (LASIX) 40 MG tablet TAKE 2 TABLETS (80 MG TOTAL) BY MOUTH EVERY MORNING. (Patient taking differently: Take 80 mg by mouth daily. ) 180 tablet 3  . glipiZIDE (GLUCOTROL XL) 10 MG 24 hr tablet TAKE 1 TABLET TWICE DAILY (Patient taking differently: Take 10 mg by mouth in the morning and at bedtime. TAKE 1 TABLET TWICE DAILY) 180 tablet 0  . labetalol (NORMODYNE) 200 MG tablet TAKE 3 TABLETS EVERY MORNING  AND TAKE 2 TABLETS EVERY EVENING (Patient taking differently: Take 400-600 mg by mouth See admin instructions. TAKE 3 TABLETS EVERY MORNING  AND TAKE 2 TABLETS EVERY EVENING) 450 tablet 2  . levothyroxine (SYNTHROID) 150 MCG tablet TAKE 1 TABLET EVERY DAY (Patient taking differently: Take 150 mcg by mouth daily before breakfast. TAKE 1 TABLET EVERY DAY) 90  tablet 3  . lidocaine-prilocaine (EMLA) cream Apply 1 application topically as needed. (Patient taking differently: Apply 1 application topically as needed (port). ) 30 g 0  . metFORMIN (GLUCOPHAGE) 1000 MG tablet TAKE 1 TABLET TWICE DAILY WITH MEALS (Patient taking differently: Take 1,000 mg  by mouth 2 (two) times daily with a meal. ) 180 tablet 0  . NIFEdipine (PROCARDIA XL/NIFEDICAL XL) 60 MG 24 hr tablet TAKE 1 TABLET EVERY DAY (Patient taking differently: Take 60 mg by mouth daily. TAKE 1 TABLET EVERY DAY) 90 tablet 2  . pioglitazone (ACTOS) 15 MG tablet Take 1 tablet (15 mg total) by mouth daily. 90 tablet 1  . potassium chloride SA (KLOR-CON) 20 MEQ tablet TAKE 1 TABLET TWICE DAILY (Patient taking differently: Take 20 mEq by mouth 2 (two) times daily. TAKE 1 TABLET TWICE DAILY) 180 tablet 3  . predniSONE (DELTASONE) 5 MG tablet TAKE 1 TABLET (5 MG TOTAL) BY MOUTH DAILY WITH BREAKFAST. 90 tablet 3  . sildenafil (VIAGRA) 50 MG tablet Take 1 tablet (50 mg total) by mouth daily as needed for erectile dysfunction. (Patient not taking: Reported on 02/20/2020) 10 tablet 3   No current facility-administered medications on file prior to visit.    There were no vitals taken for this visit.      Objective:   Physical Exam Vitals and nursing note reviewed.  Constitutional:      Appearance: Normal appearance.  Cardiovascular:     Rate and Rhythm: Normal rate and regular rhythm.     Pulses: Normal pulses.     Heart sounds: Normal heart sounds.  Pulmonary:     Effort: Pulmonary effort is normal.     Breath sounds: Normal breath sounds.  Skin:    General: Skin is warm and dry.  Neurological:     General: No focal deficit present.     Mental Status: He is alert and oriented to person, place, and time.  Psychiatric:        Mood and Affect: Mood normal.        Behavior: Behavior normal.        Thought Content: Thought content normal.        Judgment: Judgment normal.       Assessment &  Plan:  1. Type 2 diabetes mellitus without complication, without long-term current use of insulin ([place]) - POCT A1C- 6.3 - has stayed at goal. No changes in medications  - Will have him follow up in December for CPE   [name], NP

## 2020-02-22 NOTE — Patient Instructions (Addendum)
It was great seeing you today   Your A1c was 6.3 - this is great   Please follow up after December 3rd for your physical

## 2020-02-27 ENCOUNTER — Institutional Professional Consult (permissible substitution) (INDEPENDENT_AMBULATORY_CARE_PROVIDER_SITE_OTHER): Payer: Medicare Other | Admitting: Surgery

## 2020-02-27 ENCOUNTER — Encounter: Payer: Self-pay | Admitting: Surgery

## 2020-02-27 ENCOUNTER — Other Ambulatory Visit: Payer: Self-pay

## 2020-02-27 VITALS — BP 123/74 | HR 62 | Temp 97.6°F | Resp 20 | Ht 67.0 in | Wt 215.0 lb

## 2020-02-27 DIAGNOSIS — I350 Nonrheumatic aortic (valve) stenosis: Secondary | ICD-10-CM | POA: Diagnosis not present

## 2020-02-27 NOTE — Progress Notes (Signed)
Patient ID: Matthew Serrano., male   DOB: Jun 18, 1946, 74 y.o.   MRN: [id number]  [facility] CONSULTATION REPORT  Referring Provider is [name], MD Primary Cardiologist is [name], MD PCP is [name], NP  Chief Complaint  Patient presents with  . Aortic Stenosis    Surgical eval for TAVR, review all studies/testing    HPI:  The patient is a 74 year old gentleman with type II diabetes, hypertension, hyperlipidemia, hypothyroidism, metastatic prostate cancer on maintenance chemotherapy followed by Dr. Alen Blew and aortic stenosis.  He had an echocardiogram on 01/26/2019 showing moderate aortic stenosis with a mean gradient of 22 mmHg and a peak velocity of 3.1 m/s.  He had a repeat echocardiogram on 02/05/2020 which showed progression of his aortic stenosis to severe with an increase in the mean gradient to 44 mmHg and a peak gradient of 65 mmHg.  The aortic valve area 0.88 cm.  Left ventricular ejection fraction was 65 to 70% with mild concentric LVH and grade 1 diastolic dysfunction.  He has been a widower for 46 years and lives by himself.  He takes care of his house and still mows his grass but uses a riding lawnmower now.  He does not specifically exercise but stays busy.  He reports some mild chest burning which he feels is heartburn after eating certain foods.  He has no chest pain or pressure with exertion.  He reports some dizziness with bending over.  He does have some exertional shortness of breath with low-level activity.  He has a chronic lower extremity edema being worse in the left leg than the right.  He saw Dr. Donnetta Hutching who felt that it was lymphedema related to his metastatic prostate cancer.  He said that he was recently referred by his primary dentist to Dr. Benson Norway for dental extractions.  These have not been performed yet.  Past Medical History:  Diagnosis Date  . Aortic stenosis     Echo 8/18: mild LVH, EF 60-65, no RWMA, mild AS (mean 10, peak 19), MAC, trivial MR, normal RVSF  // Echo 01/2019: EF 60-65, Gr 1 DD, mod AS (mean 22, peak 39)   . DIABETES MELLITUS, TYPE II 07/06/2007  . ED (erectile dysfunction)   . Elevated PSA 01/20/2012  . Elevated PSA   . Heart murmur   . History of nuclear stress test    Myoview 8/18: EF 64, inferobasal thinning, no ischemia, low risk  . HYPERLIPIDEMIA 07/06/2007  . HYPERTENSION 07/06/2007  . HYPOTHYROIDISM 07/06/2007  . Leg pain    ABIs 8/18:  Normal   . Obesity     Past Surgical History:  Procedure Laterality Date  . COLONOSCOPY    . COLONOSCOPY W/ BIOPSIES AND POLYPECTOMY    . IR IMAGING GUIDED PORT INSERTION  07/05/2018  . POLYPECTOMY    . RIGHT/LEFT HEART CATH AND CORONARY ANGIOGRAPHY N/A 02/13/2020   Procedure: RIGHT/LEFT HEART CATH AND CORONARY ANGIOGRAPHY;  Surgeon: Belva Crome, MD;  Location: [facility];  Service: Cardiovascular;  Laterality: N/A;    Family History  Problem Relation Age of Onset  . Hypertension Other   . Breast cancer Sister   . Breast cancer Other        family history  . Breast cancer Brother        prostate ca  . Aneurysm Mother        Brain aneurysm   . Prostate cancer  Father   . Colon cancer Neg Hx     Social History   Socioeconomic History  . Marital status: Widowed    Spouse name: Not on file  . Number of children: Not on file  . Years of education: Not on file  . Highest education level: Not on file  Occupational History  . Not on file  Tobacco Use  . Smoking status: Former Research scientist (life sciences)  . Smokeless tobacco: Never Used  . Tobacco comment: quit 2005  Vaping Use  . Vaping Use: Never used  Substance and Sexual Activity  . Alcohol use: Yes    Alcohol/week: 21.0 standard drinks    Types: 21 Cans of beer per week  . Drug use: No  . Sexual activity: Not on file  Other Topics Concern  . Not on file  Social History Narrative   He works as a Freight forwarder    Not  married    No kids          Social Determinants of Radio broadcast assistant Strain:   . Difficulty of Paying Living Expenses:   Food Insecurity:   . Worried About Charity fundraiser in the Last Year:   . Arboriculturist in the Last Year:   Transportation Needs:   . Film/video editor (Medical):   Marland Kitchen Lack of Transportation (Non-Medical):   Physical Activity:   . Days of Exercise per Week:   . Minutes of Exercise per Session:   Stress:   . Feeling of Stress :   Social Connections:   . Frequency of Communication with Friends and Family:   . Frequency of Social Gatherings with Friends and Family:   . Attends Religious Services:   . Active Member of Clubs or Organizations:   . Attends Archivist Meetings:   Marland Kitchen Marital Status:   Intimate Partner Violence:   . Fear of Current or Ex-Partner:   . Emotionally Abused:   Marland Kitchen Physically Abused:   . Sexually Abused:     Current Outpatient Medications  Medication Sig Dispense Refill  . abiraterone acetate (ZYTIGA) 250 MG tablet TAKE 4 TABLETS (1,000 MG TOTAL) BY MOUTH DAILY. TAKE ON AN EMPTY STOMACH 1 HOUR BEFORE OR 2 HOURS AFTER A MEAL 120 tablet 0  . aspirin EC 81 MG tablet Take 81 mg by mouth daily.    Marland Kitchen atorvastatin (LIPITOR) 40 MG tablet TAKE 1 TABLET EVERY DAY (Patient taking differently: Take 40 mg by mouth daily. TAKE 1 TABLET EVERY DAY) 90 tablet 2  . cholecalciferol (VITAMIN D3) 25 MCG (1000 UT) tablet Take 1,000 Units by mouth daily.    . cloNIDine (CATAPRES) 0.2 MG tablet TAKE 2 TABLETS EVERY MORNING  AND TAKE 2 TABLETS EVERY EVENING (Patient taking differently: Take 0.4 mg by mouth 2 (two) times daily. TAKE 2 TABLETS EVERY MORNING  AND TAKE 2 TABLETS EVERY EVENING) 360 tablet 3  . doxazosin (CARDURA) 8 MG tablet TAKE 1 TABLET AT BEDTIME (Patient taking differently: Take 8 mg by mouth at bedtime. TAKE 1 TABLET AT BEDTIME) 90 tablet 3  . fosinopril (MONOPRIL) 40 MG tablet TAKE 2 TABLETS BY MOUTH EVERY DAY (Patient  taking differently: Take 80 mg by mouth daily. ) 180 tablet 1  . furosemide (LASIX) 40 MG tablet TAKE 2 TABLETS (80 MG TOTAL) BY MOUTH EVERY MORNING. (Patient taking differently: Take 80 mg by mouth daily. ) 180 tablet 3  . glipiZIDE (GLUCOTROL XL) 10 MG 24 hr tablet TAKE  1 TABLET TWICE DAILY (Patient taking differently: Take 10 mg by mouth in the morning and at bedtime. TAKE 1 TABLET TWICE DAILY) 180 tablet 0  . labetalol (NORMODYNE) 200 MG tablet TAKE 3 TABLETS EVERY MORNING  AND TAKE 2 TABLETS EVERY EVENING (Patient taking differently: Take 400-600 mg by mouth See admin instructions. TAKE 3 TABLETS EVERY MORNING  AND TAKE 2 TABLETS EVERY EVENING) 450 tablet 2  . levothyroxine (SYNTHROID) 150 MCG tablet TAKE 1 TABLET EVERY DAY (Patient taking differently: Take 150 mcg by mouth daily before breakfast. TAKE 1 TABLET EVERY DAY) 90 tablet 3  . lidocaine-prilocaine (EMLA) cream Apply 1 application topically as needed. (Patient taking differently: Apply 1 application topically as needed (port). ) 30 g 0  . metFORMIN (GLUCOPHAGE) 1000 MG tablet TAKE 1 TABLET TWICE DAILY WITH MEALS (Patient taking differently: Take 1,000 mg by mouth 2 (two) times daily with a meal. ) 180 tablet 0  . NIFEdipine (PROCARDIA XL/NIFEDICAL XL) 60 MG 24 hr tablet TAKE 1 TABLET EVERY DAY (Patient taking differently: Take 60 mg by mouth daily. TAKE 1 TABLET EVERY DAY) 90 tablet 2  . pioglitazone (ACTOS) 15 MG tablet Take 1 tablet (15 mg total) by mouth daily. 90 tablet 1  . potassium chloride SA (KLOR-CON) 20 MEQ tablet TAKE 1 TABLET TWICE DAILY (Patient taking differently: Take 20 mEq by mouth 2 (two) times daily. TAKE 1 TABLET TWICE DAILY) 180 tablet 3  . predniSONE (DELTASONE) 5 MG tablet TAKE 1 TABLET (5 MG TOTAL) BY MOUTH DAILY WITH BREAKFAST. 90 tablet 3   No current facility-administered medications for this visit.    No Known Allergies    Review of Systems:   General:  normal appetite, + decreased energy, no weight  gain, no weight loss, no fever  Cardiac:  no chest pain with exertion, no chest pain at rest, +SOB with mild exertion, no resting SOB, no PND, no orthopnea, no palpitations, no arrhythmia, no atrial fibrillation, + LE edema, + dizzy spells, no syncope  Respiratory:  + exertional shortness of breath, no home oxygen, no productive cough, no dry cough, no bronchitis, no wheezing, no hemoptysis, no asthma, no pain with inspiration or cough, no sleep apnea, no CPAP at night  GI:   no difficulty swallowing, no reflux, + frequent heartburn, no hiatal hernia, no abdominal pain, no constipation, no diarrhea, no hematochezia, no hematemesis, no melena  GU:   no dysuria,  no frequency, no urinary tract infection, no hematuria, no enlarged prostate, no kidney stones, no kidney disease  Vascular:  no pain suggestive of claudication, no pain in feet, no leg cramps, no varicose veins, no DVT, no non-healing foot ulcer  Neuro:   no stroke, no TIA's, no seizures, no headaches, no temporary blindness one eye,  no slurred speech, no peripheral neuropathy, no chronic pain, no instability of gait, no memory/cognitive dysfunction  Musculoskeletal: no arthritis, no joint swelling, no myalgias, no difficulty walking, normal mobility   Skin:   no rash, no itching, no skin infections, no pressure sores or ulcerations  Psych:   no anxiety, no depression, no nervousness, no unusual recent stress  Eyes:   no blurry vision, + floaters, no recent vision changes, does not wear glasses or contacts  ENT:   no hearing loss, no loose or painful teeth, no dentures, last saw dentist 02/26/2020. Extractions planned.  Hematologic:  no easy bruising, no abnormal bleeding, no clotting disorder, no frequent epistaxis  Endocrine:  + diabetes, does check CBG's  at home           Physical Exam:   BP 123/74   Pulse 62   Temp 97.6 F (36.4 C) (Skin)   Resp 20   Ht _0  (1.702 m)   Wt 215 lb (97.5 kg)   SpO2 96% Comment: RA  BMI 33.67  kg/m   General:  well-appearing  HEENT:  [place]/AT, PERLA, EOMI, poor dentition.  Neck:   no JVD, no bruits, no adenopathy   Chest:   clear to auscultation, symmetrical breath sounds, no wheezes, no rhonchi   CV:   RRR, grade lll/VI crescendo/decrescendo murmur heard best at RSB,  no diastolic murmur  Abdomen:  soft, non-tender, no masses   Extremities:  warm, well-perfused, pulses not palpable at ankle due to edema, + LE edema moderate on left, mild on right.  Rectal/GU  Deferred  Neuro:   Grossly non-focal and symmetrical throughout  Skin:   Clean and dry, no rashes, no breakdown   Diagnostic Tests:  ECHOCARDIOGRAM REPORT       Patient Name:  Narvel Kozub. Date of Exam: 02/05/2020  Medical Rec #: [id number]     Height:    67.0 in  Accession #:  [id number]     Weight:    218.1 lb  Date of Birth: 1946/03/25     BSA:     2.098 m  Patient Age:  51 years      BP:      134/64 mmHg  Patient Gender: M         HR:      67 bpm.  Exam Location: [facility]   Procedure: 2D Echo, Cardiac Doppler and Color Doppler   Indications:  I35.0 Aortic Stenosis    History:    Patient has prior history of Echocardiogram examinations,  most         recent 01/26/2019. Risk Factors:Hypertension, Dyslipidemia  and         Diabetes. Murmur. Hypothyroidism.    Sonographer:  Cresenciano Lick RDCS  Referring Phys: [id number] WEAVER   IMPRESSIONS    1. The aortic valve is tricuspid. Aortic valve regurgitation is not  visualized. Severe aortic valve stenosis. Aortic valve area, by VTI  measures 0.88 cm. Aortic valve mean gradient measures 44.0 mmHg. Aortic  valve Vmax measures 4.03 m/s.  2. Left ventricular ejection fraction, by estimation, is 65 to 70%. The  left ventricle has normal function. The left ventricle has no regional  wall motion abnormalities. There is mild concentric left ventricular    hypertrophy. Left ventricular diastolic  parameters are consistent with Grade I diastolic dysfunction (impaired  relaxation).  3. Right ventricular systolic function is normal. The right ventricular  size is normal. There is normal pulmonary artery systolic pressure. The  estimated right ventricular systolic pressure is 42.7 mmHg.  4. Left atrial size was mildly dilated.  5. The mitral valve is grossly normal. Mild mitral valve regurgitation.  No evidence of mitral stenosis.  6. The inferior vena cava is normal in size with greater than 50%  respiratory variability, suggesting right atrial pressure of 3 mmHg.   Comparison(s): Changes from prior study are noted. Aortic stenosis is now  severe. Values obtained from RSB.   FINDINGS  Left Ventricle: Left ventricular ejection fraction, by estimation, is 65  to 70%. The left ventricle has normal function. The left ventricle has no  regional wall motion abnormalities. The left ventricular internal cavity  size was normal  in size. There is  mild concentric left ventricular hypertrophy. Left ventricular diastolic  parameters are consistent with Grade I diastolic dysfunction (impaired  relaxation). Normal left ventricular filling pressure.   Right Ventricle: The right ventricular size is normal. No increase in  right ventricular wall thickness. Right ventricular systolic function is  normal. There is normal pulmonary artery systolic pressure. The tricuspid  regurgitant velocity is 2.63 m/s, and  with an assumed right atrial pressure of 3 mmHg, the estimated right  ventricular systolic pressure is 86.7 mmHg.   Left Atrium: Left atrial size was mildly dilated.   Right Atrium: Right atrial size was normal in size.   Pericardium: Trivial pericardial effusion is present. Presence of  pericardial fat pad.   Mitral Valve: The mitral valve is grossly normal. Mild mitral valve  regurgitation. No evidence of mitral valve stenosis.    Tricuspid Valve: The tricuspid valve is grossly normal. Tricuspid valve  regurgitation is mild.   Aortic Valve: The aortic valve is tricuspid. . There is severe thickening  and severe calcifcation of the aortic valve. Aortic valve regurgitation is  not visualized. Severe aortic stenosis is present. There is severe  thickening of the aortic valve. There  is severe calcifcation of the aortic valve. Aortic valve mean gradient  measures 44.0 mmHg. Aortic valve peak gradient measures 65.0 mmHg. Aortic  valve area, by VTI measures 0.88 cm.   Pulmonic Valve: The pulmonic valve was grossly normal. Pulmonic valve  regurgitation is not visualized. No evidence of pulmonic stenosis.   Aorta: The aortic root and ascending aorta are structurally normal, with  no evidence of dilitation.   Venous: The inferior vena cava is normal in size with greater than 50%  respiratory variability, suggesting right atrial pressure of 3 mmHg.   IAS/Shunts: The atrial septum is grossly normal.     LEFT VENTRICLE  PLAX 2D  LVIDd:     4.50 cm Diastology  LVIDs:     2.90 cm LV e' lateral:  7.83 cm/s  LV PW:     1.30 cm LV E/e' lateral: 12.2  LV IVS:    1.30 cm LV e' medial:  6.47 cm/s  LVOT diam:   2.00 cm LV E/e' medial: 14.7  LV SV:     92  LV SV Index:  44  LVOT Area:   3.14 cm     RIGHT VENTRICLE  RV Basal diam: 3.70 cm  RV S prime:   18.00 cm/s  TAPSE (M-mode): 2.8 cm   LEFT ATRIUM       Index    RIGHT ATRIUM      Index  LA diam:    5.30 cm 2.53 cm/m RA Area:   16.40 cm  LA Vol (A2C):  65.3 ml 31.12 ml/m RA Volume:  49.60 ml 23.64 ml/m  LA Vol (A4C):  74.1 ml 35.32 ml/m  LA Biplane Vol: 69.7 ml 33.22 ml/m  AORTIC VALVE  AV Area (Vmax):  0.86 cm  AV Area (Vmean):  0.86 cm  AV Area (VTI):   0.88 cm  AV Vmax:      403.00 cm/s  AV Vmean:     280.800 cm/s  AV VTI:      1.040 m  AV Peak Grad:    65.0 mmHg  AV Mean Grad:   44.0 mmHg  LVOT Vmax:     110.00 cm/s  LVOT Vmean:    77.000 cm/s  LVOT VTI:     0.292 m  LVOT/AV  VTI ratio: 0.28    AORTA  Ao Root diam: 3.20 cm  Ao Asc diam: 3.30 cm   MITRAL VALVE        TRICUSPID VALVE  MV Area (PHT): 3.53 cm   TR Peak grad:  27.7 mmHg  MV Decel Time: 215 msec   TR Vmax:    263.00 cm/s  MV E velocity: 95.20 cm/s  MV A velocity: 104.00 cm/s SHUNTS  MV E/A ratio: 0.92     Systemic VTI: 0.29 m               Systemic Diam: 2.00 cm   Eleonore Chiquito MD  Electronically signed by Eleonore Chiquito MD  Signature Date/Time: 02/05/2020/4:48:13 PM    Physicians  Panel Physicians Referring Physician Case Authorizing Physician  Belva Crome, MD (Primary)    Procedures  RIGHT/LEFT HEART CATH AND CORONARY ANGIOGRAPHY  Conclusion   Calcific aortic stenosis with mean aortic valve gradient 28 mmHg.  Aortic valve area 2.37 cm based upon dynamic expectedly high cardiac output of 10.3 L/min.  Widely patent coronary arteries.  LAD with eccentric 40 to 50% mid to distal narrowing..  Eccentric 50% mid RCA.  50% mid circumflex in the segment beyond the first obtuse marginal.  Normal right heart pressures.  Normal pulmonary capillary wedge pressure, mean 8 mmHg.  RECOMMENDATIONS:   Will forward information to Dr. Burt Knack. Surgeon Notes    02/13/2020 2:10 PM CV Procedure signed by Belva Crome, MD  Indications  Nonrheumatic aortic valve stenosis [I35.0 (ICD-10-CM)]  Procedural Details  Technical Details The right radial area was sterilely prepped and draped. Intravenous sedation with Versed and fentanyl was administered. 1% Xylocaine was infiltrated to achieve local analgesia. Using real-time vascular ultrasound, a double wall stick with an angiocath was utilized to obtain intra-arterial access. A VUS image was saved for the permanent record.The modified Seldinger technique was  used to place a 5 F " Slender" sheath in the right radial artery. Weight based heparin was administered. Coronary angiography was done using 5 F catheters. Right coronary angiography was performed with a JR4. Left ventricular hemodymic recordings and angiography was done using the JR 4 catheter and hand injection. Left coronary angiography was performed with a JL 3.5 cm.  Right heart catheterization was performed by exchanging a previously placed antecubital IV angio-cath for a 5 French Slender sheath. 1% Xylocaine was used to locally nesthetize the area around the IV site. The IV catheter was wired using an .018 guidewire. The modified Seldinger technique was used to place the 6-7 [place] sheath. Double glove technique was used to enhance sterility. After sheath insertion, right heart cath was performed using a 5 French balloon tipped catheter and fluoroscopic guidance. Pressures were recorded in each chamber and in the pulmonary capillary wedge position. The main pulmonary artery O2 saturation was sampled.   Hemostasis was achieved using a pneumatic band.  During this procedure the patient is administered a total of Versed 1 mg and Fentanyl 25 mcg to achieve and maintain moderate conscious sedation.  The patient's heart rate, blood pressure, and oxygen saturation are monitored continuously during the procedure. The period of conscious sedation is 34 minutes, of which I was present face-to-face 100% of this time. Estimated blood loss <50 mL.   During this procedure medications were administered to achieve and maintain moderate conscious sedation while the patient's heart rate, blood pressure, and oxygen saturation were continuously monitored and I was present face-to-face 100% of this time.  Medications (Filter:  Administrations occurring from 1309 to 1412 on 02/13/20) lidocaine (PF) (XYLOCAINE) 1 % injection (mL) Total volume:  4 mL Date/Time  Rate/Dose/Volume Action  02/13/20 1333  2 mL Given  1335   2 mL Given    Radial Cocktail/Verapamil only (mL) Total volume:  10 mL Date/Time  Rate/Dose/Volume Action  02/13/20 1337  10 mL Given    fentaNYL (SUBLIMAZE) injection (mcg) Total dose:  25 mcg Date/Time  Rate/Dose/Volume Action  02/13/20 1329  25 mcg Given    midazolam (VERSED) injection (mg) Total dose:  1 mg Date/Time  Rate/Dose/Volume Action  02/13/20 1329  1 mg Given    heparin sodium (porcine) injection (Units) Total dose:  5,000 Units Date/Time  Rate/Dose/Volume Action  02/13/20 1347  5,000 Units Given    Heparin (Porcine) in NaCl 1000-0.9 UT/500ML-% SOLN (mL) Total volume:  1,000 mL Date/Time  Rate/Dose/Volume Action  02/13/20 1354  500 mL Given  1354  500 mL Given    iohexol (OMNIPAQUE) 350 MG/ML injection (mL) Total volume:  60 mL Date/Time  Rate/Dose/Volume Action  02/13/20 1407  60 mL Given    potassium chloride (KLOR-CON) CR tablet (mEq) Total dose:  20 mEq Date/Time  Rate/Dose/Volume Action  02/13/20 1410  20 mEq Given    Sedation Time  Sedation Time Physician-1: 34 minutes 56 seconds  Contrast  Medication Name Total Dose  iohexol (OMNIPAQUE) 350 MG/ML injection 60 mL    Radiation/Fluoro  Fluoro time: 7.4 (min) DAP: 17.6 (Gycm2) Cumulative Air Kerma: 325.5 (mGy)  Coronary Findings  Diagnostic Dominance: Right Left Anterior Descending  Mid LAD lesion is 50% stenosed.  Left Circumflex  Prox Cx to Mid Cx lesion is 50% stenosed.  Right Coronary Artery  Mid RCA lesion is 45% stenosed.  Intervention  No interventions have been documented. Right Heart  Right Heart Pressures Hemodynamic findings consistent with pulmonary hypertension and aortic valve stenosis. LV EDP is normal.  Left Heart  Aortic Valve There is moderate aortic valve stenosis. The aortic valve is calcified. There is restricted aortic valve motion.  Coronary Diagrams  Diagnostic Dominance: Right  Intervention  Implants   No implant documentation for this case.   Syngo Images  Show images for CARDIAC CATHETERIZATION Images on Long Term Storage  Show images for Rhylan, Kagel. Link to Procedure Log  Procedure Log    Hemo Data   Most Recent Value  Fick Cardiac Output 10.32 L/min  Fick Cardiac Output Index 4.94 (L/min)/BSA  Aortic Mean Gradient 29.28 mmHg  Aortic Peak Gradient 28 mmHg  Aortic Valve Area 2.37  Aortic Value Area Index 1.14 cm2/BSA  RA A Wave 7 mmHg  RA V Wave 4 mmHg  RA Mean 3 mmHg  RV Systolic Pressure 28 mmHg  RV Diastolic Pressure 0 mmHg  RV EDP 3 mmHg  PA Systolic Pressure 25 mmHg  PA Diastolic Pressure 8 mmHg  PA Mean 14 mmHg  PW A Wave 14 mmHg  PW V Wave 8 mmHg  PW Mean 8 mmHg  AO Systolic Pressure 163 mmHg  AO Diastolic Pressure 67 mmHg  AO Mean 94 mmHg  LV Systolic Pressure 845 mmHg  LV Diastolic Pressure 6 mmHg  LV EDP 11 mmHg  AOp Systolic Pressure 364 mmHg  AOp Diastolic Pressure 58 mmHg  AOp Mean Pressure 86 mmHg  LVp Systolic Pressure 680 mmHg  LVp Diastolic Pressure 0 mmHg  LVp EDP Pressure 9 mmHg  QP/QS 1  TPVR Index 2.83 HRUI  TSVR Index 19.03 HRUI  PVR  SVR Ratio 0.07  TPVR/TSVR Ratio 0.15   ADDENDUM REPORT: 02/20/2020 17:16  CLINICAL DATA:  Severe Aortic Stenosis.  EXAM: Cardiac TAVR CT  TECHNIQUE: The patient was scanned on a [employer]. A 120 kV retrospective scan was triggered in the descending thoracic aorta at 111 HU's. Gantry rotation speed was 250 msecs and collimation was .6 mm. No beta blockade or nitro were given. The 3D data set was reconstructed in 5% intervals of the R-R cycle. Systolic and diastolic phases were analyzed on a dedicated work station using MPR, MIP and VRT modes. The patient received 80 cc of contrast.  FINDINGS: Image quality: Average.  Noise artifact is: Limited.  Valve Morphology: The aortic valve is tricuspid. The leaflets are severely calcified with restricted movement in systole. All leaflets appear diffusely  calcified.  Aortic Valve Calcium score: 1698  Aortic annular dimension:  Phase assessed: 25%  Annular area: 428 mm2  Annular perimeter: 76.1 mm  Max diameter: 28.4 mm  Min diameter: 20.7 mm  Annular and subannular calcification: No significant annular or subannular calcification.  Optimal coplanar projection: LAO 13 CRA 13  Coronary Artery Height above Annulus:  Left Main: 11.8 mm  Right Coronary: 18.0 mm  Sinus of Valsalva Measurements:  Non-coronary: 31 mm  Right-coronary: 30 mm  Left-coronary: 31 mm  Sinus of Valsalva Height:  Non-coronary: 21.5 mm  Right-coronary: 21.7 mm  Left-coronary: 21.3 mm  Sinotubular Junction: 27 mm  Ascending Thoracic Aorta: 33 mm  Coronary Arteries: Normal coronary origin. Right dominance. 3-vessel coronary atherosclerotic plaque noted. The study was performed without use of NTG and is insufficient for plaque evaluation. Please refer to recent cardiac catheterization for coronary assessment.  Cardiac Morphology:  Right Atrium: Right atrial size is within normal limits. There is a vascular catheter (port) present in the SVC extending into the right atrium.  Right Ventricle: The right ventricular cavity is within normal limits.  Left Atrium: Left atrial size is normal in size with no left atrial appendage filling defect.  Left Ventricle: The ventricular cavity size is within normal limits. There are no stigmata of prior infarction. There is no abnormal filling defect.  Pulmonary arteries: Normal in size without proximal filling defect.  Pulmonary veins: Normal pulmonary venous drainage.  Pericardium: Normal thickness with no significant effusion or calcium present.  Mitral Valve: The mitral valve is normal structure without significant calcification.  Extra-cardiac findings: See attached radiology report for non-cardiac structures.  IMPRESSION: 1. Annular measurements  appropriate for 23 mm Edwards Sapien 3 TAVR (428 mm2).  2. No significant annular or subannular calcifications.  3. Sufficient coronary to annulus distance.  4. Optimal Fluoroscopic Angle for Delivery: LAO 13 CRA [place] T. Audie Box, MD   Electronically Signed   By: Eleonore Chiquito   On: 02/20/2020 17:16   Addended by Geralynn Rile, MD on 02/20/2020 5:18 PM  Study Result  Addenda  ADDENDUM REPORT: 02/20/2020 17:16  CLINICAL DATA:  Severe Aortic Stenosis.  EXAM: Cardiac TAVR CT  TECHNIQUE: The patient was scanned on a [employer]. A 120 kV retrospective scan was triggered in the descending thoracic aorta at 111 HU's. Gantry rotation speed was 250 msecs and collimation was .6 mm. No beta blockade or nitro were given. The 3D data set was reconstructed in 5% intervals of the R-R cycle. Systolic and diastolic phases were analyzed on a dedicated work station using MPR, MIP and VRT modes. The patient received 80 cc of contrast.  FINDINGS: Image  quality: Average.  Noise artifact is: Limited.  Valve Morphology: The aortic valve is tricuspid. The leaflets are severely calcified with restricted movement in systole. All leaflets appear diffusely calcified.  Aortic Valve Calcium score: 1698  Aortic annular dimension:  Phase assessed: 25%  Annular area: 428 mm2  Annular perimeter: 76.1 mm  Max diameter: 28.4 mm  Min diameter: 20.7 mm  Annular and subannular calcification: No significant annular or subannular calcification.  Optimal coplanar projection: LAO 13 CRA 13  Coronary Artery Height above Annulus:  Left Main: 11.8 mm  Right Coronary: 18.0 mm  Sinus of Valsalva Measurements:  Non-coronary: 31 mm  Right-coronary: 30 mm  Left-coronary: 31 mm  Sinus of Valsalva Height:  Non-coronary: 21.5 mm  Right-coronary: 21.7 mm  Left-coronary: 21.3 mm  Sinotubular Junction: 27 mm  Ascending Thoracic  Aorta: 33 mm  Coronary Arteries: Normal coronary origin. Right dominance. 3-vessel coronary atherosclerotic plaque noted. The study was performed without use of NTG and is insufficient for plaque evaluation. Please refer to recent cardiac catheterization for coronary assessment.  Cardiac Morphology:  Right Atrium: Right atrial size is within normal limits. There is a vascular catheter (port) present in the SVC extending into the right atrium.  Right Ventricle: The right ventricular cavity is within normal limits.  Left Atrium: Left atrial size is normal in size with no left atrial appendage filling defect.  Left Ventricle: The ventricular cavity size is within normal limits. There are no stigmata of prior infarction. There is no abnormal filling defect.  Pulmonary arteries: Normal in size without proximal filling defect.  Pulmonary veins: Normal pulmonary venous drainage.  Pericardium: Normal thickness with no significant effusion or calcium present.  Mitral Valve: The mitral valve is normal structure without significant calcification.  Extra-cardiac findings: See attached radiology report for non-cardiac structures.  IMPRESSION: 1. Annular measurements appropriate for 23 mm Edwards Sapien 3 TAVR (428 mm2).  2. No significant annular or subannular calcifications.  3. Sufficient coronary to annulus distance.  4. Optimal Fluoroscopic Angle for Delivery: LAO 13 CRA [place] T. Audie Box, MD   Electronically Signed   By: Eleonore Chiquito   On: 02/20/2020 17:16   Signed by Geralynn Rile, MD on 02/20/2020 5:18 PM  Narrative & Impression  EXAM: OVER-READ INTERPRETATION  CT CHEST  The following report is an over-read performed by radiologist Dr. Vinnie Langton of [facility], [place] on 02/20/2020. This over-read does not include interpretation of cardiac or coronary anatomy or pathology. The coronary calcium score/coronary  CTA interpretation by the cardiologist is attached.  COMPARISON:  None.  FINDINGS: Extracardiac findings will be described separately under dictation for contemporaneously obtained CTA chest, abdomen and pelvis.  IMPRESSION: Please see separate dictation for contemporaneously obtained CTA chest, abdomen and pelvis dated 02/20/2020 for full description of relevant extracardiac findings.  Electronically Signed: By: Vinnie Langton M.D. On: 02/20/2020 12:49    Narrative & Impression  CLINICAL DATA:  74 year old male with history of severe aortic stenosis. Preprocedural study prior to potential transcatheter aortic valve replacement (TAVR) procedure.  EXAM: CT ANGIOGRAPHY CHEST, ABDOMEN AND PELVIS  TECHNIQUE: Non-contrast CT of the chest was initially obtained.  Multidetector CT imaging through the chest, abdomen and pelvis was performed using the standard protocol during bolus administration of intravenous contrast. Multiplanar reconstructed images and MIPs were obtained and reviewed to evaluate the vascular anatomy.  CONTRAST:  117m OMNIPAQUE IOHEXOL 350 MG/ML SOLN  COMPARISON:  No priors.  FINDINGS: CTA CHEST FINDINGS  Cardiovascular: Heart  size is mildly enlarged. There is no significant pericardial fluid, thickening or pericardial calcification. There is aortic atherosclerosis, as well as atherosclerosis of the great vessels of the mediastinum and the coronary arteries, including calcified atherosclerotic plaque in the left main, left anterior descending, left circumflex and right coronary arteries. Severe thickening calcification of the aortic valve. Right internal jugular single-lumen porta cath with tip terminating at the superior cavoatrial junction.  Mediastinum/Lymph Nodes: No pathologically enlarged mediastinal or hilar lymph nodes. Esophagus is unremarkable in appearance. No axillary lymphadenopathy.  Lungs/Pleura: No acute  consolidative airspace disease. No pleural effusions. No suspicious appearing pulmonary nodules or masses are noted.  Musculoskeletal/Soft Tissues: There are no aggressive appearing lytic or blastic lesions noted in the visualized portions of the skeleton.  CTA ABDOMEN AND PELVIS FINDINGS  Hepatobiliary: Diffuse low attenuation throughout the hepatic parenchyma, indicative of hepatic steatosis. No discrete cystic or solid hepatic lesions. No intra or extrahepatic biliary ductal dilatation. 4 mm calcified gallstone lying dependently in the gallbladder. Gallbladder is not distended and there are no surrounding inflammatory changes to suggest an acute cholecystitis at this time.  Pancreas: No pancreatic mass. No pancreatic ductal dilatation. No pancreatic or peripancreatic fluid collections or inflammatory changes.  Spleen: Unremarkable.  Adrenals/Urinary Tract: Multiple subcentimeter low-attenuation lesions are noted in the kidneys bilaterally, too small to characterize, but statistically likely to represent cysts. Cortical scarring in the interpolar region of the left kidney, likely sequela of prior infection. No hydroureteronephrosis. Urinary bladder is normal in appearance. 11 mm nodule in the lateral limb of the right adrenal gland, stable in retrospect compared to the prior study from 2019, likely to represent a benign lesions such as a lipid poor adenoma. Left adrenal gland is normal in appearance.  Stomach/Bowel: The appearance of the stomach is normal. No pathologic dilatation of small bowel or colon. Numerous colonic diverticulae are noted, particularly in the descending colon and sigmoid colon, without surrounding inflammatory changes to suggest an acute diverticulitis at this time. The appendix is not confidently identified and may be surgically absent. Regardless, there are no inflammatory changes noted adjacent to the cecum to suggest the presence of an  acute appendicitis at this time.  Vascular/Lymphatic: Aortic atherosclerosis, with vascular findings and measurements pertinent to potential TAVR procedure, as detailed below. No aneurysm or dissection noted in the abdominal or pelvic vasculature. No lymphadenopathy noted in the abdomen or pelvis.  Reproductive: Prostate gland and seminal vesicles are unremarkable in appearance.  Other: No significant volume of ascites.  No pneumoperitoneum.  Musculoskeletal: There are no aggressive appearing lytic or blastic lesions noted in the visualized portions of the skeleton.  VASCULAR MEASUREMENTS PERTINENT TO TAVR:  AORTA:  Minimal Aortic Diameter-16 x 14 mm  Severity of Aortic Calcification-moderate  RIGHT PELVIS:  Right Common Iliac Artery -  Minimal Diameter-8.0 x 9.1 mm  Tortuosity-mild  Calcification-moderate  Right External Iliac Artery -  Minimal Diameter-9.1 x 8.7 mm  Tortuosity-severe  Calcification-mild  Right Common Femoral Artery -  Minimal Diameter-8.2 x 7.0 mm  Tortuosity-mild  Calcification-mild-to-moderate  LEFT PELVIS:  Left Common Iliac Artery -  Minimal Diameter-7.3 x 7.1 mm  Tortuosity-mild  Calcification-moderate to severe  Left External Iliac Artery -  Minimal Diameter-9.2 x 8.5 mm  Tortuosity-moderate to severe  Calcification-mild  Left Common Femoral Artery -  Minimal Diameter-6.6 x 6.6 mm  Tortuosity-mild  Calcification-moderate  Review of the MIP images confirms the above findings.  IMPRESSION: 1. Vascular findings and measurements pertinent to potential TAVR procedure, as  detailed above. 2. Severe thickening calcification of the aortic valve, compatible with reported clinical history of severe aortic stenosis. 3. Aortic atherosclerosis, in addition to left main and 3 vessel coronary artery disease. 4. Mild cardiomegaly. 5. Hepatic steatosis. 6. Cholelithiasis without evidence  of acute cholecystitis at this time. 7. Colonic diverticulosis without evidence of acute diverticulitis at this time. 8. Additional incidental findings, as above.   Electronically Signed   By: Vinnie Langton M.D.   On: 02/20/2020 15:04     STS Risk Calculator: Risk of Mortality: 1.612% Renal Failure: 3.303% Permanent Stroke: 1.439% Prolonged Ventilation: 6.344% DSW Infection: 0.229% Reoperation: 4.092% Morbidity or Mortality: 11.549% Short Length of Stay: 35.079% Long Length of Stay: 5.704%  Impression:  This 74 year old gentleman has stage D, severe, symptomatic aortic stenosis with New York Heart Association class II symptoms of exertional fatigue and shortness of breath consistent with chronic diastolic congestive heart failure as well as exertional dizziness with bending over.  He also has metastatic prostate cancer that is castration resistant but controlled on maintenance chemotherapy.  Dr. Alen Blew feels that he has good functional status and reasonable survival.  I have personally reviewed his 2D echo, cardiac catheterization, and CTA studies.  His echocardiogram shows progression of aortic stenosis to severe with a calcified aortic valve with restricted mobility.  The mean gradient is increased to 44 mmHg with a valve area of 0.8 cm.  Left ventricular systolic function is normal.  Cardiac catheterization showed mild nonobstructive coronary disease.  The mean gradient by cath was 29 mmHg with an aortic valve area calculated 2.37 cm.  I am suspicious of these numbers given his cardiac output of 10.3 L/min.  I agree that aortic valve replacement is reasonable in this patient given his good functional status and desire to maintain a good active quality of life.  Given his metastatic prostate cancer I think transcatheter aortic valve replacement would be the best treatment option for him.  His gated cardiac CTA shows anatomy suitable for transcatheter aortic valve  replacement using a SAPIEN 3 valve.  His abdominal and pelvic CTA shows adequate pelvic vascular anatomy to allow transfemoral insertion.  The patient was counseled at length regarding treatment alternatives for management of severe symptomatic aortic stenosis. The risks and benefits of surgical intervention has been discussed in detail. Long-term prognosis with medical therapy was discussed. Alternative approaches such as conventional surgical aortic valve replacement, transcatheter aortic valve replacement, and palliative medical therapy were compared and contrasted at length. This discussion was placed in the context of the patient's own specific clinical presentation and past medical history. All of his questions have been addressed.   The patient has been advised of a variety of complications that might develop including but not limited to risks of death, stroke, paravalvular leak, aortic dissection or other major vascular complications, aortic annulus rupture, device embolization, cardiac rupture or perforation, mitral regurgitation, acute myocardial infarction, arrhythmia, heart block or bradycardia requiring permanent pacemaker placement, congestive heart failure, respiratory failure, renal failure, pneumonia, infection, other late complications related to structural valve deterioration or migration, or other complications that might ultimately cause a temporary or permanent loss of functional independence or other long term morbidity.   He would like to think about transcatheter aortic valve replacement further and still needs to have dental extractions done by Dr. Benson Norway in the near future.  I do not think there is any contraindication to proceeding with dental extractions at this time prior to aortic valve replacement.   Plan:  He is planning to proceed with dental extractions and once he is recovered from that will let us know about proceeding with transcatheter aortic valve placement.  I  spent 60 minutes performing this consultation and > 50% of this time was spent face to face counseling and coordinating the care of this patient's severe symptomatic aortic stenosis.     Gaye Pollack, MD 02/27/2020 10:11 AM

## 2020-02-28 ENCOUNTER — Other Ambulatory Visit: Payer: Self-pay | Admitting: Adult Health

## 2020-02-28 DIAGNOSIS — E119 Type 2 diabetes mellitus without complications: Secondary | ICD-10-CM

## 2020-02-28 NOTE — Telephone Encounter (Signed)
SENT IN FOR 6 MONTHS ON 10/23/2019.  REQUEST IS TOO EARLY.

## 2020-03-06 MED FILL — predniSONE 5 MG TABS: 5 | 90 days supply | Qty: 90 | Fill #1

## 2020-03-11 ENCOUNTER — Other Ambulatory Visit: Payer: Self-pay | Admitting: Oncology

## 2020-03-11 DIAGNOSIS — C61 Malignant neoplasm of prostate: Secondary | ICD-10-CM

## 2020-03-20 MED FILL — ABIRATERONE ACETATE 250 MG: 250 | 30 days supply | Qty: 120 | Fill #0

## 2020-04-01 ENCOUNTER — Other Ambulatory Visit: Payer: Self-pay | Admitting: Adult Health

## 2020-04-02 NOTE — Telephone Encounter (Signed)
Sent to the pharmacy by e-scribe. 

## 2020-04-16 ENCOUNTER — Other Ambulatory Visit: Payer: Self-pay | Admitting: Oncology

## 2020-04-16 DIAGNOSIS — C61 Malignant neoplasm of prostate: Secondary | ICD-10-CM

## 2020-04-17 ENCOUNTER — Encounter: Payer: Self-pay | Admitting: Physician Assistant

## 2020-04-17 ENCOUNTER — Other Ambulatory Visit: Payer: Self-pay

## 2020-04-17 ENCOUNTER — Ambulatory Visit (INDEPENDENT_AMBULATORY_CARE_PROVIDER_SITE_OTHER): Payer: Medicare Other | Admitting: Physician Assistant

## 2020-04-17 DIAGNOSIS — I10 Essential (primary) hypertension: Secondary | ICD-10-CM

## 2020-04-17 MED ORDER — CLONIDINE HCL 0.2 MG PO TABS: 0.2000 mg | ORAL_TABLET | Freq: Two times a day (BID) | ORAL | 3 refills | Status: DC

## 2020-04-17 NOTE — Patient Instructions (Signed)
Medication Instructions:  1) STOP FOSINOPRIL  2) DECREASE CLONIDINE to 1 tablet twice daily *If you need a refill on your cardiac medications before your next appointment, please call your pharmacy*  Lab Work: TODAY: BMET If you have labs (blood work) drawn today and your tests are completely normal, you will receive your results only by: Marland Kitchen MyChart Message (if you have MyChart) OR . A paper copy in the mail If you have any lab test that is abnormal or we need to change your treatment, we will call you to review the results.

## 2020-04-17 NOTE — Progress Notes (Signed)
thx

## 2020-04-17 NOTE — Progress Notes (Addendum)
[facility] [place]                                       Cardiology Office Note    Date:  04/17/2020   ID:  Matthew Brizzi., DOB 09-25-1945, MRN [id number]  PCP:  [name], NP  Cardiologist: Dr. Burt Knack   CC: evaluation of dizziness.   History of Present Illness:  Matthew Serrano. is a 74 y.o. male with a history of type II diabetes, hypertension, left lower extremmity lymphedema,  hyperlipidemia, hypothyroidism, metastatic prostate cancer on maintenance chemotherapy followed by Dr. Alen Blew and severe aortic stenosis in the work up for TAVR who presetnes tof evaluation of dizziness.   He had an echocardiogram on 01/26/2019 showing moderate aortic stenosis with a mean gradient of 22 mmHg and a peak velocity of 3.1 m/s.  He had a repeat echocardiogram on 02/05/2020 which showed progression of his aortic stenosis to severe with an increase in the mean gradient to 44 mmHg and a peak gradient of 65 mmHg. The aortic valve area 0.88 cm. Left ventricular ejection fraction was 65 to 70% with mild concentric LVH and grade 1 diastolic dysfunction. [facility] 02/13/20 calcific aortic stenosis with mean aortic valve gradient 28 mmHg. Aortic valve area 2.37 cm based upon dynamic expectedly high cardiac output of 10.3 L/min, widely patent coronary arteries, LAD with eccentric 40 to 50% mid to distal narrowing, eccentric 50% mid RCA, 50% mid circumflex in the segment beyond the first obtuse marginal, nm, normal right heart pressures and normal pulmonary capillary wedge pressure, mean 8 mmHg. He has been evaluated by the multidisciplinary valve team and plan is for TAVR after dental extractions which has not happened yet).   He was added onto my schedule today for evaluation of dizziness. Pt has not had a good appetite recently and had reduced PO intake but says he has been staying hydrated. Has been having ongoing dizziness for 3 weeks. Worse with positional  changes (Standing from sitting) and walking. Bp has been low with systolic BPs in 95J at time. BP 90/44 today. No chest pain. Has chronic shortness of breath which is unchanged. Left lower extremity is chronically swollen with no change. No syncope.    Past Medical History:  Diagnosis Date  . Aortic stenosis    Echo 8/18: mild LVH, EF 60-65, no RWMA, mild AS (mean 10, peak 19), MAC, trivial MR, normal RVSF  // Echo 01/2019: EF 60-65, Gr 1 DD, mod AS (mean 22, peak 39)   . DIABETES MELLITUS, TYPE II 07/06/2007  . ED (erectile dysfunction)   . Elevated PSA 01/20/2012  . Elevated PSA   . Heart murmur   . History of nuclear stress test    Myoview 8/18: EF 64, inferobasal thinning, no ischemia, low risk  . HYPERLIPIDEMIA 07/06/2007  . HYPERTENSION 07/06/2007  . HYPOTHYROIDISM 07/06/2007  . Leg pain    ABIs 8/18:  Normal   . Obesity     Past Surgical History:  Procedure Laterality Date  . COLONOSCOPY    . COLONOSCOPY W/ BIOPSIES AND POLYPECTOMY    . IR IMAGING GUIDED PORT INSERTION  07/05/2018  . POLYPECTOMY    . RIGHT/LEFT HEART CATH AND CORONARY ANGIOGRAPHY N/A 02/13/2020   Procedure: RIGHT/LEFT HEART CATH AND CORONARY ANGIOGRAPHY;  Surgeon: Belva Crome, MD;  Location: [facility];  Service: Cardiovascular;  Laterality: N/A;    Current Medications: Outpatient Medications Prior to Visit  Medication Sig Dispense Refill  . abiraterone acetate (ZYTIGA) 250 MG tablet TAKE 4 TABLETS (1,000 MG TOTAL) BY MOUTH DAILY. TAKE ON AN EMPTY STOMACH 1 HOUR BEFORE OR 2 HOURS AFTER A MEAL 120 tablet 0  . aspirin EC 81 MG tablet Take 81 mg by mouth daily.    Marland Kitchen atorvastatin (LIPITOR) 40 MG tablet TAKE 1 TABLET EVERY DAY (Patient taking differently: Take 40 mg by mouth daily. TAKE 1 TABLET EVERY DAY) 90 tablet 2  . cholecalciferol (VITAMIN D3) 25 MCG (1000 UT) tablet Take 1,000 Units by mouth daily.    Marland Kitchen doxazosin (CARDURA) 8 MG tablet TAKE 1 TABLET AT BEDTIME (Patient taking differently: Take 8  mg by mouth at bedtime. TAKE 1 TABLET AT BEDTIME) 90 tablet 3  . furosemide (LASIX) 40 MG tablet TAKE 2 TABLETS (80 MG TOTAL) BY MOUTH EVERY MORNING. (Patient taking differently: Take 80 mg by mouth daily. ) 180 tablet 3  . glipiZIDE (GLUCOTROL XL) 10 MG 24 hr tablet TAKE 1 TABLET TWICE DAILY (Patient taking differently: Take 10 mg by mouth in the morning and at bedtime. TAKE 1 TABLET TWICE DAILY) 180 tablet 0  . labetalol (NORMODYNE) 200 MG tablet TAKE 3 TABLETS EVERY MORNING  AND TAKE 2 TABLETS EVERY EVENING (Patient taking differently: Take 400-600 mg by mouth See admin instructions. TAKE 3 TABLETS EVERY MORNING  AND TAKE 2 TABLETS EVERY EVENING) 450 tablet 2  . levothyroxine (SYNTHROID) 150 MCG tablet TAKE 1 TABLET EVERY DAY (Patient taking differently: Take 150 mcg by mouth daily before breakfast. TAKE 1 TABLET EVERY DAY) 90 tablet 3  . lidocaine-prilocaine (EMLA) cream Apply 1 application topically as needed. (Patient taking differently: Apply 1 application topically as needed (port). ) 30 g 0  . metFORMIN (GLUCOPHAGE) 1000 MG tablet TAKE 1 TABLET TWICE DAILY WITH MEALS 180 tablet 1  . NIFEdipine (PROCARDIA XL/NIFEDICAL XL) 60 MG 24 hr tablet TAKE 1 TABLET EVERY DAY (Patient taking differently: Take 60 mg by mouth daily. TAKE 1 TABLET EVERY DAY) 90 tablet 2  . pioglitazone (ACTOS) 15 MG tablet Take 1 tablet (15 mg total) by mouth daily. 90 tablet 1  . potassium chloride SA (KLOR-CON) 20 MEQ tablet TAKE 1 TABLET TWICE DAILY (Patient taking differently: Take 20 mEq by mouth 2 (two) times daily. TAKE 1 TABLET TWICE DAILY) 180 tablet 3  . predniSONE (DELTASONE) 5 MG tablet TAKE 1 TABLET (5 MG TOTAL) BY MOUTH DAILY WITH BREAKFAST. 90 tablet 3  . cloNIDine (CATAPRES) 0.2 MG tablet TAKE 2 TABLETS EVERY MORNING  AND TAKE 2 TABLETS EVERY EVENING (Patient taking differently: Take 0.4 mg by mouth 2 (two) times daily. TAKE 2 TABLETS EVERY MORNING  AND TAKE 2 TABLETS EVERY EVENING) 360 tablet 3  . fosinopril  (MONOPRIL) 40 MG tablet TAKE 2 TABLETS BY MOUTH EVERY DAY (Patient taking differently: Take 80 mg by mouth daily. ) 180 tablet 1   No facility-administered medications prior to visit.     Allergies:   Patient has no known allergies.   Social History   Socioeconomic History  . Marital status: Widowed    Spouse name: Not on file  . Number of children: Not on file  . Years of education: Not on file  . Highest education level: Not on file  Occupational History  . Not on file  Tobacco Use  . Smoking status: Former Research scientist (life sciences)  . Smokeless tobacco: Never  Used  . Tobacco comment: quit 2005  Vaping Use  . Vaping Use: Never used  Substance and Sexual Activity  . Alcohol use: Yes    Alcohol/week: 21.0 standard drinks    Types: 21 Cans of beer per week  . Drug use: No  . Sexual activity: Not on file  Other Topics Concern  . Not on file  Social History Narrative   He works as a Freight forwarder    Not married    No kids          Social Determinants of Radio broadcast assistant Strain:   . Difficulty of Paying Living Expenses: Not on file  Food Insecurity:   . Worried About Charity fundraiser in the Last Year: Not on file  . Ran Out of Food in the Last Year: Not on file  Transportation Needs:   . Lack of Transportation (Medical): Not on file  . Lack of Transportation (Non-Medical): Not on file  Physical Activity:   . Days of Exercise per Week: Not on file  . Minutes of Exercise per Session: Not on file  Stress:   . Feeling of Stress : Not on file  Social Connections:   . Frequency of Communication with Friends and Family: Not on file  . Frequency of Social Gatherings with Friends and Family: Not on file  . Attends Religious Services: Not on file  . Active Member of Clubs or Organizations: Not on file  . Attends Archivist Meetings: Not on file  . Marital Status: Not on file     Family History:  The patient's family history includes Aneurysm in his mother;  Breast cancer in his brother, sister, and another family member; Hypertension in an other family member; Prostate cancer in his father.     ROS:   Please see the history of present illness.    ROS All other systems reviewed and are negative.   PHYSICAL EXAM:   VS:  BP (!) 90/44   Pulse 76   Ht _0  (1.727 m)   Wt 201 lb (91.2 kg)   SpO2 98%   BMI 30.56 kg/m    GEN: Well nourished, well developed, in no acute distress HEENT: normal Neck: no JVD or masses Cardiac: RRR; 3/6 harsh SEM .No rubs, or gallops. 1 + LLE edema.  Respiratory:  clear to auscultation bilaterally, normal work of breathing GI: soft, nontender, nondistended, + BS MS: no deformity or atrophy Skin: warm and dry, no rash Neuro:  Alert and Oriented x 3, Strength and sensation are intact Psych: euthymic mood, full affect   Wt Readings from Last 3 Encounters:  04/17/20 201 lb (91.2 kg)  02/27/20 215 lb (97.5 kg)  02/22/20 214 lb (97.1 kg)      Studies/Labs Reviewed:   EKG:  EKG is NOT ordered today.    Recent Labs: 07/26/2019: TSH 1.92 12/27/2019: ALT 15 02/08/2020: BUN 15; Creatinine, Ser 0.99; Platelets 217 02/13/2020: Hemoglobin 9.2; Potassium 2.7; Sodium 144   Lipid Panel    Component Value Date/Time   CHOL 154 07/26/2019 0921   TRIG 113.0 07/26/2019 0921   HDL 48.20 07/26/2019 0921   CHOLHDL 3 07/26/2019 0921   VLDL 22.6 07/26/2019 0921   LDLCALC 83 07/26/2019 0921   LDLDIRECT 105.0 04/28/2016 0756    Additional studies/ records that were reviewed today include:  IMPRESSIONS    1. The aortic valve is tricuspid. Aortic valve regurgitation is not  visualized. Severe aortic valve  stenosis. Aortic valve area, by VTI  measures 0.88 cm. Aortic valve mean gradient measures 44.0 mmHg. Aortic  valve Vmax measures 4.03 m/s.  2. Left ventricular ejection fraction, by estimation, is 65 to 70%. The  left ventricle has normal function. The left ventricle has no regional  wall motion abnormalities.  There is mild concentric left ventricular  hypertrophy. Left ventricular diastolic  parameters are consistent with Grade I diastolic dysfunction (impaired  relaxation).  3. Right ventricular systolic function is normal. The right ventricular  size is normal. There is normal pulmonary artery systolic pressure. The  estimated right ventricular systolic pressure is 71.2 mmHg.  4. Left atrial size was mildly dilated.  5. The mitral valve is grossly normal. Mild mitral valve regurgitation.  No evidence of mitral stenosis.  6. The inferior vena cava is normal in size with greater than 50%  respiratory variability, suggesting right atrial pressure of 3 mmHg.   Comparison(s): Changes from prior study are noted. Aortic stenosis is now  severe. Values obtained from [facility]SB.   FINDINGS  Left Ventricle: Left ventricular ejection fraction, by estimation, is 65  to 70%. The left ventricle has normal function. The left ventricle has no  regional wall motion abnormalities. The left ventricular internal cavity  size was normal in size. There is  mild concentric left ventricular hypertrophy. Left ventricular diastolic  parameters are consistent with Grade I diastolic dysfunction (impaired  relaxation). Normal left ventricular filling pressure.   Right Ventricle: The right ventricular size is normal. No increase in  right ventricular wall thickness. Right ventricular systolic function is  normal. There is normal pulmonary artery systolic pressure. The tricuspid  regurgitant velocity is 2.63 m/s, and  with an assumed right atrial pressure of 3 mmHg, the estimated right  ventricular systolic pressure is 45.8 mmHg.   Left Atrium: Left atrial size was mildly dilated.   Right Atrium: Right atrial size was normal in size.   Pericardium: Trivial pericardial effusion is present. Presence of  pericardial fat pad.   Mitral Valve: The mitral valve is grossly normal. Mild mitral valve  regurgitation. No  evidence of mitral valve stenosis.   Tricuspid Valve: The tricuspid valve is grossly normal. Tricuspid valve  regurgitation is mild.   Aortic Valve: The aortic valve is tricuspid. . There is severe thickening  and severe calcifcation of the aortic valve. Aortic valve regurgitation is  not visualized. Severe aortic stenosis is present. There is severe  thickening of the aortic valve. There  is severe calcifcation of the aortic valve. Aortic valve mean gradient  measures 44.0 mmHg. Aortic valve peak gradient measures 65.0 mmHg. Aortic  valve area, by VTI measures 0.88 cm.   Pulmonic Valve: The pulmonic valve was grossly normal. Pulmonic valve  regurgitation is not visualized. No evidence of pulmonic stenosis.   Aorta: The aortic root and ascending aorta are structurally normal, with  no evidence of dilitation.   Venous: The inferior vena cava is normal in size with greater than 50%  respiratory variability, suggesting right atrial pressure of 3 mmHg.   IAS/Shunts: The atrial septum is grossly normal.     LEFT VENTRICLE  PLAX 2D  LVIDd:     4.50 cm Diastology  LVIDs:     2.90 cm LV e' lateral:  7.83 cm/s  LV PW:     1.30 cm LV E/e' lateral: 12.2  LV IVS:    1.30 cm LV e' medial:  6.47 cm/s  LVOT diam:   2.00 cm  LV E/e' medial: 14.7  LV SV:     92  LV SV Index:  44  LVOT Area:   3.14 cm     RIGHT VENTRICLE  RV Basal diam: 3.70 cm  RV S prime:   18.00 cm/s  TAPSE (M-mode): 2.8 cm   LEFT ATRIUM       Index    RIGHT ATRIUM      Index  LA diam:    5.30 cm 2.53 cm/m RA Area:   16.40 cm  LA Vol (A2C):  65.3 ml 31.12 ml/m RA Volume:  49.60 ml 23.64 ml/m  LA Vol (A4C):  74.1 ml 35.32 ml/m  LA Biplane Vol: 69.7 ml 33.22 ml/m  AORTIC VALVE  AV Area (Vmax):  0.86 cm  AV Area (Vmean):  0.86 cm  AV Area (VTI):   0.88 cm  AV Vmax:      403.00 cm/s  AV Vmean:     280.800 cm/s  AV VTI:       1.040 m  AV Peak Grad:   65.0 mmHg  AV Mean Grad:   44.0 mmHg  LVOT Vmax:     110.00 cm/s  LVOT Vmean:    77.000 cm/s  LVOT VTI:     0.292 m  LVOT/AV VTI ratio: 0.28    AORTA  Ao Root diam: 3.20 cm  Ao Asc diam: 3.30 cm   MITRAL VALVE        TRICUSPID VALVE  MV Area (PHT): 3.53 cm   TR Peak grad:  27.7 mmHg  MV Decel Time: 215 msec   TR Vmax:    263.00 cm/s  MV E velocity: 95.20 cm/s  MV A velocity: 104.00 cm/s SHUNTS  MV E/A ratio: 0.92     Systemic VTI: 0.29 m               Systemic Diam: 2.00 cm   Eleonore Chiquito MD  Electronically signed by Eleonore Chiquito MD  Signature Date/Time: 02/05/2020/4:48:13 PM    Physicians  Panel Physicians Referring Physician Case Authorizing Physician  Belva Crome, MD (Primary)    Procedures  RIGHT/LEFT HEART CATH AND CORONARY ANGIOGRAPHY  Conclusion   Calcific aortic stenosis with mean aortic valve gradient 28 mmHg. Aortic valve area 2.37 cm based upon dynamic expectedly high cardiac output of 10.3 L/min.  Widely patent coronary arteries.  LAD with eccentric 40 to 50% mid to distal narrowing..  Eccentric 50% mid RCA.  50% mid circumflex in the segment beyond the first obtuse marginal.  Normal right heart pressures. Normal pulmonary capillary wedge pressure, mean 8 mmHg.  RECOMMENDATIONS:   Will forward information to Dr. Burt Knack    ASSESSMENT & PLAN:   Dizziness: I suspect dizziness is from hypotension. His BP has been low and he has dizziness with positional changes but also with exertion. He is on several antihypertensives including clonidine 0.2 mg (two tabs in AM and PM), lasix 13m daily, fosinopril 840mdaily, doxazosin 53m14maily, labetalol 600m63m AM and 400mg93mPM, nifedipine XL 60mg 10my. I have asked him to stop taking his fosinopril and cut clonidine in half (1 tab in AM and 1 in PM). He will keep a log of his BPs. I have gotten labs to assess  for AKI given hypotension and nephrotoxic medications. Also check CBC to assess for acute blood loss anemia. If his dizziness persists despite improvement in BPs, it could be related to his severe AS and that would be an indication to  move forward with valve surgery sooner than later. I will call him back Monday to check in on symptoms and BP.   Of note, he also needs his teeth extracted. I called Dr. Raynelle Dick office and he can fit him in for extraction next Thursday.   ADDENDUM 8/27 2:22pm: Lab work shows AKI with creat up from 0.99--> 2.31 and Hg down from 9.2--> 7.7 . Pt does admit to some more black stools lately. I have asked him to completely stop his lasix as well. His PCP got him in today and FOBT +. He has been referred to GI. Will cancel apt for dental extraction on Thursday. I went over ER precautions for him if he becomes weaker or passes out.   Medication Adjustments/Labs and Tests Ordered: Current medicines are reviewed at length with the patient today.  Concerns regarding medicines are outlined above.  Medication changes, Labs and Tests ordered today are listed in the Patient Instructions below. Patient Instructions  Medication Instructions:  1) STOP FOSINOPRIL  2) DECREASE CLONIDINE to 1 tablet twice daily *If you need a refill on your cardiac medications before your next appointment, please call your pharmacy*  Lab Work: TODAY: BMET If you have labs (blood work) drawn today and your tests are completely normal, you will receive your results only by: Marland Kitchen MyChart Message (if you have MyChart) OR . A paper copy in the mail If you have any lab test that is abnormal or we need to change your treatment, we will call you to review the results.    Signed, Angelena Form, PA-C  04/17/2020 3:31 PM    [facility] [address], [place], [place]  [address] Phone: [phone]; Fax: [phone]

## 2020-04-18 ENCOUNTER — Ambulatory Visit (INDEPENDENT_AMBULATORY_CARE_PROVIDER_SITE_OTHER): Payer: Medicare Other | Admitting: Adult Health

## 2020-04-18 ENCOUNTER — Telehealth: Payer: Self-pay | Admitting: Adult Health

## 2020-04-18 ENCOUNTER — Encounter: Payer: Self-pay | Admitting: Adult Health

## 2020-04-18 VITALS — BP 90/40 | HR 87 | Temp 99.0°F | Ht 68.0 in | Wt 203.0 lb

## 2020-04-18 DIAGNOSIS — K922 Gastrointestinal hemorrhage, unspecified: Secondary | ICD-10-CM | POA: Diagnosis not present

## 2020-04-18 LAB — CBC WITH DIFFERENTIAL/PLATELET
Basophils Absolute: 0 10*3/uL (ref 0.0–0.2)
Basos: 0 %
EOS (ABSOLUTE): 0 10*3/uL (ref 0.0–0.4)
Eos: 1 %
Hematocrit: 24.5 % — ABNORMAL LOW (ref 37.5–51.0)
Hemoglobin: 7.7 g/dL — ABNORMAL LOW (ref 13.0–17.7)
Immature Grans (Abs): 0.1 10*3/uL (ref 0.0–0.1)
Immature Granulocytes: 1 %
Lymphocytes Absolute: 1 10*3/uL (ref 0.7–3.1)
Lymphs: 12 %
MCH: 26.1 pg — ABNORMAL LOW (ref 26.6–33.0)
MCHC: 31.4 g/dL — ABNORMAL LOW (ref 31.5–35.7)
MCV: 83 fL (ref 79–97)
Monocytes Absolute: 0.7 10*3/uL (ref 0.1–0.9)
Monocytes: 8 %
Neutrophils Absolute: 6.8 10*3/uL (ref 1.4–7.0)
Neutrophils: 78 %
Platelets: 238 10*3/uL (ref 150–450)
RBC: 2.95 x10E6/uL — ABNORMAL LOW (ref 4.14–5.80)
RDW: 13.9 % (ref 11.6–15.4)
WBC: 8.6 10*3/uL (ref 3.4–10.8)

## 2020-04-18 LAB — BASIC METABOLIC PANEL
BUN/Creatinine Ratio: 10 (ref 10–24)
BUN: 23 mg/dL (ref 8–27)
CO2: 27 mmol/L (ref 20–29)
Calcium: 8.5 mg/dL — ABNORMAL LOW (ref 8.6–10.2)
Chloride: 97 mmol/L (ref 96–106)
Creatinine, Ser: 2.31 mg/dL — ABNORMAL HIGH (ref 0.76–1.27)
GFR calc Af Amer: 31 mL/min/{1.73_m2} — ABNORMAL LOW (ref >59)
GFR calc non Af Amer: 27 mL/min/{1.73_m2} — ABNORMAL LOW (ref >59)
Glucose: 178 mg/dL — ABNORMAL HIGH (ref 65–99)
Potassium: 3.3 mmol/L — ABNORMAL LOW (ref 3.5–5.2)
Sodium: 139 mmol/L (ref 134–144)

## 2020-04-18 NOTE — Telephone Encounter (Signed)
Pt has been scheduled.  °

## 2020-04-18 NOTE — Progress Notes (Addendum)
Subjective:    Patient ID: Matthew Serrano., male    DOB: [birth year], 74 y.o.   MRN: [id number]  HPI  74 year old male who  has a past medical history of Aortic stenosis, DIABETES MELLITUS, TYPE II (07/06/2007), ED (erectile dysfunction), Elevated PSA (01/20/2012), Elevated PSA, Heart murmur, History of nuclear stress test, HYPERLIPIDEMIA (07/06/2007), HYPERTENSION (07/06/2007), HYPOTHYROIDISM (07/06/2007), Leg pain, and Obesity.  He was seen by cardiology yesterday for work-up for TAVR procedure which time he complained of some dizziness and hypotension.  His ACE was stopped, clonidine was cut in half and Lasix was discontinued as well.  His work-up showed AKI with a creatinine up to 2.31 from a baseline of 0.99 and hemoglobin down from  9.2-7.7,- over two months.   He did report some black tarry stools lately.  He was advised to follow-up today for FOBT.  Cardiology has repeat labs planned for Monday  Today he reports that he has had intermittent episodes of dark tarry stools over the last 2 months.  The last time he noticed dark tarry stools was 2 weeks ago.  He denies abdominal pain, nausea, or vomiting.  Last colonoscopy was in 2019 which showed multiple colon polyps   Review of Systems See HPI   Past Medical History:  Diagnosis Date  . Aortic stenosis    Echo 8/18: mild LVH, EF 60-65, no RWMA, mild AS (mean 10, peak 19), MAC, trivial MR, normal RVSF  // Echo 01/2019: EF 60-65, Gr 1 DD, mod AS (mean 22, peak 39)   . DIABETES MELLITUS, TYPE II 07/06/2007  . ED (erectile dysfunction)   . Elevated PSA 01/20/2012  . Elevated PSA   . Heart murmur   . History of nuclear stress test    Myoview 8/18: EF 64, inferobasal thinning, no ischemia, low risk  . HYPERLIPIDEMIA 07/06/2007  . HYPERTENSION 07/06/2007  . HYPOTHYROIDISM 07/06/2007  . Leg pain    ABIs 8/18:  Normal   . Obesity     Social History   Socioeconomic History  . Marital status: Widowed    Spouse name: Not on file    . Number of children: Not on file  . Years of education: Not on file  . Highest education level: Not on file  Occupational History  . Not on file  Tobacco Use  . Smoking status: Former Research scientist (life sciences)  . Smokeless tobacco: Never Used  . Tobacco comment: quit 2005  Vaping Use  . Vaping Use: Never used  Substance and Sexual Activity  . Alcohol use: Yes    Alcohol/week: 21.0 standard drinks    Types: 21 Cans of beer per week  . Drug use: No  . Sexual activity: Not on file  Other Topics Concern  . Not on file  Social History Narrative   He works as a Freight forwarder    Not married    No kids          Social Determinants of Radio broadcast assistant Strain:   . Difficulty of Paying Living Expenses: Not on file  Food Insecurity:   . Worried About Charity fundraiser in the Last Year: Not on file  . Ran Out of Food in the Last Year: Not on file  Transportation Needs:   . Lack of Transportation (Medical): Not on file  . Lack of Transportation (Non-Medical): Not on file  Physical Activity:   . Days of Exercise per Week: Not on file  . Minutes of  Exercise per Session: Not on file  Stress:   . Feeling of Stress : Not on file  Social Connections:   . Frequency of Communication with Friends and Family: Not on file  . Frequency of Social Gatherings with Friends and Family: Not on file  . Attends Religious Services: Not on file  . Active Member of Clubs or Organizations: Not on file  . Attends Archivist Meetings: Not on file  . Marital Status: Not on file  Intimate Partner Violence:   . Fear of Current or Ex-Partner: Not on file  . Emotionally Abused: Not on file  . Physically Abused: Not on file  . Sexually Abused: Not on file    Past Surgical History:  Procedure Laterality Date  . COLONOSCOPY    . COLONOSCOPY W/ BIOPSIES AND POLYPECTOMY    . IR IMAGING GUIDED PORT INSERTION  07/05/2018  . POLYPECTOMY    . RIGHT/LEFT HEART CATH AND CORONARY ANGIOGRAPHY N/A  02/13/2020   Procedure: RIGHT/LEFT HEART CATH AND CORONARY ANGIOGRAPHY;  Surgeon: Belva Crome, MD;  Location: [facility];  Service: Cardiovascular;  Laterality: N/A;    Family History  Problem Relation Age of Onset  . Hypertension Other   . Breast cancer Sister   . Breast cancer Other        family history  . Breast cancer Brother        prostate ca  . Aneurysm Mother        Brain aneurysm   . Prostate cancer Father   . Colon cancer Neg Hx     No Known Allergies  Current Outpatient Medications on File Prior to Visit  Medication Sig Dispense Refill  . abiraterone acetate (ZYTIGA) 250 MG tablet TAKE 4 TABLETS (1,000 MG TOTAL) BY MOUTH DAILY. TAKE ON AN EMPTY STOMACH 1 HOUR BEFORE OR 2 HOURS AFTER A MEAL 120 tablet 0  . aspirin EC 81 MG tablet Take 81 mg by mouth daily.    Marland Kitchen atorvastatin (LIPITOR) 40 MG tablet TAKE 1 TABLET EVERY DAY (Patient taking differently: Take 40 mg by mouth daily. TAKE 1 TABLET EVERY DAY) 90 tablet 2  . cholecalciferol (VITAMIN D3) 25 MCG (1000 UT) tablet Take 1,000 Units by mouth daily.    . cloNIDine (CATAPRES) 0.2 MG tablet Take 1 tablet (0.2 mg total) by mouth 2 (two) times daily. 180 tablet 3  . doxazosin (CARDURA) 8 MG tablet TAKE 1 TABLET AT BEDTIME (Patient taking differently: Take 8 mg by mouth at bedtime. TAKE 1 TABLET AT BEDTIME) 90 tablet 3  . furosemide (LASIX) 40 MG tablet TAKE 2 TABLETS (80 MG TOTAL) BY MOUTH EVERY MORNING. (Patient taking differently: Take 80 mg by mouth daily. ) 180 tablet 3  . glipiZIDE (GLUCOTROL XL) 10 MG 24 hr tablet TAKE 1 TABLET TWICE DAILY (Patient taking differently: Take 10 mg by mouth in the morning and at bedtime. TAKE 1 TABLET TWICE DAILY) 180 tablet 0  . labetalol (NORMODYNE) 200 MG tablet TAKE 3 TABLETS EVERY MORNING  AND TAKE 2 TABLETS EVERY EVENING (Patient taking differently: Take 400-600 mg by mouth See admin instructions. TAKE 3 TABLETS EVERY MORNING  AND TAKE 2 TABLETS EVERY EVENING) 450 tablet 2  .  levothyroxine (SYNTHROID) 150 MCG tablet TAKE 1 TABLET EVERY DAY (Patient taking differently: Take 150 mcg by mouth daily before breakfast. TAKE 1 TABLET EVERY DAY) 90 tablet 3  . lidocaine-prilocaine (EMLA) cream Apply 1 application topically as needed. (Patient taking differently: Apply 1  application topically as needed (port). ) 30 g 0  . metFORMIN (GLUCOPHAGE) 1000 MG tablet TAKE 1 TABLET TWICE DAILY WITH MEALS 180 tablet 1  . NIFEdipine (PROCARDIA XL/NIFEDICAL XL) 60 MG 24 hr tablet TAKE 1 TABLET EVERY DAY (Patient taking differently: Take 60 mg by mouth daily. TAKE 1 TABLET EVERY DAY) 90 tablet 2  . pioglitazone (ACTOS) 15 MG tablet Take 1 tablet (15 mg total) by mouth daily. 90 tablet 1  . potassium chloride SA (KLOR-CON) 20 MEQ tablet TAKE 1 TABLET TWICE DAILY (Patient taking differently: Take 20 mEq by mouth 2 (two) times daily. TAKE 1 TABLET TWICE DAILY) 180 tablet 3  . predniSONE (DELTASONE) 5 MG tablet TAKE 1 TABLET (5 MG TOTAL) BY MOUTH DAILY WITH BREAKFAST. 90 tablet 3   No current facility-administered medications on file prior to visit.    BP (!) 90/40   Pulse 87   Temp 99 F (37.2 C) (Oral)   Ht _0  (1.727 m)   Wt 203 lb (92.1 kg)   SpO2 98%   BMI 30.87 kg/m       Objective:   Physical Exam Vitals and nursing note reviewed.  Constitutional:      Appearance: Normal appearance.  Genitourinary:    Rectum: Guaiac result positive.  Musculoskeletal:        General: Normal range of motion.  Skin:    General: Skin is dry.     Capillary Refill: Capillary refill takes less than 2 seconds.  Neurological:     General: No focal deficit present.     Mental Status: He is alert and oriented to person, place, and time.  Psychiatric:        Mood and Affect: Mood normal.        Behavior: Behavior normal.        Thought Content: Thought content normal.        Judgment: Judgment normal.       Assessment & Plan:  1. Gastrointestinal hemorrhage, unspecified gastrointestinal  hemorrhage type -Positive Hemoccult.  Will refer to GI for further evaluation.  Was advised that if he is weak over the weekend then he needs to go to the emergency room - Ambulatory referral to Gastroenterology   [name], NP

## 2020-04-18 NOTE — Telephone Encounter (Signed)
Patient states his cardiologist said that he could have internal bleeding and wants him to get in touch with [facility] about this to get check\ed for it.  Pt stated BP was extremely low.

## 2020-04-18 NOTE — Telephone Encounter (Signed)
They contacted me. He needs to come in so we can check

## 2020-04-18 NOTE — Telephone Encounter (Signed)
Please advise 

## 2020-04-21 ENCOUNTER — Other Ambulatory Visit: Payer: Self-pay | Admitting: Physician Assistant

## 2020-04-21 ENCOUNTER — Other Ambulatory Visit: Payer: Medicare Other | Admitting: *Deleted

## 2020-04-21 ENCOUNTER — Telehealth: Payer: Self-pay | Admitting: Physician Assistant

## 2020-04-21 ENCOUNTER — Other Ambulatory Visit: Payer: Self-pay

## 2020-04-21 DIAGNOSIS — D62 Acute posthemorrhagic anemia: Secondary | ICD-10-CM | POA: Diagnosis not present

## 2020-04-21 DIAGNOSIS — N179 Acute kidney failure, unspecified: Secondary | ICD-10-CM

## 2020-04-21 LAB — BASIC METABOLIC PANEL
BUN/Creatinine Ratio: 18 (ref 10–24)
BUN: 20 mg/dL (ref 8–27)
CO2: 20 mmol/L (ref 20–29)
Calcium: 8.4 mg/dL — ABNORMAL LOW (ref 8.6–10.2)
Chloride: 101 mmol/L (ref 96–106)
Creatinine, Ser: 1.14 mg/dL (ref 0.76–1.27)
GFR calc Af Amer: 74 mL/min/{1.73_m2} (ref >59)
GFR calc non Af Amer: 64 mL/min/{1.73_m2} (ref >59)
Glucose: 171 mg/dL — ABNORMAL HIGH (ref 65–99)
Potassium: 3.4 mmol/L — ABNORMAL LOW (ref 3.5–5.2)
Sodium: 139 mmol/L (ref 134–144)

## 2020-04-21 LAB — CBC
Hematocrit: 22.7 % — ABNORMAL LOW (ref 37.5–51.0)
Hemoglobin: 7.3 g/dL — ABNORMAL LOW (ref 13.0–17.7)
MCH: 26.3 pg — ABNORMAL LOW (ref 26.6–33.0)
MCHC: 32.2 g/dL (ref 31.5–35.7)
MCV: 82 fL (ref 79–97)
Platelets: 243 10*3/uL (ref 150–450)
RBC: 2.78 x10E6/uL — ABNORMAL LOW (ref 4.14–5.80)
RDW: 13.7 % (ref 11.6–15.4)
WBC: 9 10*3/uL (ref 3.4–10.8)

## 2020-04-21 MED FILL — ABIRATERONE ACETATE 250 MG: 250 | 30 days supply | Qty: 120 | Fill #0

## 2020-04-21 NOTE — Telephone Encounter (Signed)
  [facility]   MULTIDISCIPLINARY HEART VALVE TEAM  Called patient to check in. His BP and dizziness are much better with decreasing his BP meds. He was seen by PCP and FOBT + He is scheduled to see GI on 9/13. I have cancelled his tentative dental extractions this week. I have ordered repeat labs today to assess kidney function and blood counts.   Angelena Form PA-C  [facility]

## 2020-04-24 ENCOUNTER — Encounter: Payer: Self-pay | Admitting: Nurse Practitioner

## 2020-04-24 ENCOUNTER — Other Ambulatory Visit (INDEPENDENT_AMBULATORY_CARE_PROVIDER_SITE_OTHER): Payer: Medicare Other

## 2020-04-24 ENCOUNTER — Ambulatory Visit (INDEPENDENT_AMBULATORY_CARE_PROVIDER_SITE_OTHER): Payer: Medicare Other | Admitting: Nurse Practitioner

## 2020-04-24 VITALS — BP 108/50 | HR 87 | Ht 67.0 in | Wt 204.0 lb

## 2020-04-24 DIAGNOSIS — K921 Melena: Secondary | ICD-10-CM

## 2020-04-24 DIAGNOSIS — D649 Anemia, unspecified: Secondary | ICD-10-CM | POA: Diagnosis not present

## 2020-04-24 LAB — IBC + FERRITIN
Ferritin: 104.3 ng/mL (ref 22.0–322.0)
Iron: 16 ug/dL — ABNORMAL LOW (ref 42–165)
Saturation Ratios: 5.4 % — ABNORMAL LOW (ref 20.0–50.0)
Transferrin: 211 mg/dL — ABNORMAL LOW (ref 212.0–360.0)

## 2020-04-24 MED ORDER — FERROUS SULFATE 325 (65 FE) MG PO TBEC: 325.0000 mg | DELAYED_RELEASE_TABLET | Freq: Two times a day (BID) | ORAL | 2 refills | Status: AC

## 2020-04-24 MED ORDER — OMEPRAZOLE 40 MG PO CPDR
40.0000 mg | DELAYED_RELEASE_CAPSULE | Freq: Every day | ORAL | 2 refills | Status: DC
Start: 2020-04-24 — End: 2020-04-30

## 2020-04-24 NOTE — Progress Notes (Signed)
Addendum: Reviewed and agree with assessment and management plan. Hance Caspers M, MD  

## 2020-04-24 NOTE — Progress Notes (Signed)
Reviewed and agree with management plan.  Jajuan Skoog T. Syla Devoss, MD FACG [facility]  

## 2020-04-24 NOTE — Patient Instructions (Signed)
Your provider has requested that you go to the basement level for lab work before leaving today. Press "B" on the elevator. The lab is located at the first door on the left as you exit the elevator.  We have sent the following medications to your pharmacy for you to pick up at your convenience: omeprazole and ferrous sulfate.   You have been scheduled for an endoscopy. Please follow written instructions given to you at your visit today. If you use inhalers (even only as needed), please bring them with you on the day of your procedure.  Due to recent changes in healthcare laws, you may see the results of your imaging and laboratory studies on MyChart before your provider has had a chance to review them.  We understand that in some cases there may be results that are confusing or concerning to you. Not all laboratory results come back in the same time frame and the provider may be waiting for multiple results in order to interpret others.  Please give [place] 48 hours in order for your provider to thoroughly review all the results before contacting the office for clarification of your results.

## 2020-04-24 NOTE — Progress Notes (Signed)
IMPRESSION and PLAN:    # Acute on chronic [place] anemia / Heme positive stool.  --Intermittent dark stools (1-2 times a month) over last few months in setting of ASA and prednisone. Rule out PUD. AVMs another possibility given aortic stenosis.  --Hgb drifted from baseline of 9.5 to 7.3 over last two months. Check iron studies then start BID oral iron --Will schedule for EGD for further evaluation. If bleeding AVMs found he may require repeat procedure with treatment at the hospital.  The risks and benefits of EGD were discussed and the patient agrees to proceed. Dr. Fuller Plan, patient's primary GI does not have any availability until the end of the month.  Dr. Hilarie Fredrickson has offered to perform the procedure.  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    # Severe aortic stenosis.   # Hx of adenomatous colon polyps ( multiple) in 2019 --3 year surveillance colonoscopy due 2022  # DM2, on oral agents   HPI:    Primary GI:  Lucio Edward, MD   Chief complaint :  Dark stool, anemia  This patient is a 74 yo male with pmh significant for but not necessarily limited to hypothyroidism, diabetes, hyperlipidemia, hypertension, history of alcoholism, aortic stenosis, prostate cancer, anemia of chronic disease, adenomatous colon polyps  Matthew Serrano was referred by PCP for evaluation of anemia and Hemoccult positive stool.  As chronic normocytic anemia with baseline hemoglobin in the low 9 range.  His hemoglobin has drifted down to 7.3.  Over the last few months he has intermittently had dark pudding-like stools.  He says these episodes occur about twice a month.  He takes a baby aspirin but is also on prednisone.  He takes no NSAIDs on a regular basis though did have one Aleve recently.  He is not on a PPI or any acid blockers.  Patient denies abdominal  pain.  He has occasional mild nausea without vomiting.    Data Reviewed:  Baseline hemoglobin 9.5.  7.7 on 04/17/2020, down to 7.3 on 04/21/2020, MCV 82, platelets 243   PREVIOUS ENDOSCOPIC EVALUATIONS / GI studies: Jan 2019 surveillance colonoscopy  -One 6 mm polyp in the cecum, removed with a hot biopsy forceps. Resected and retrieved. - Multiple non-bleeding colonic angiodysplastic lesions and one bleeding angiodysplasias. Clip (MR conditional) was placed. Hemostasis acheived. - Two 7 to 8 mm polyps in the transverse colon, removed with a cold snare. Resected and retrieved. - Three 4 to 5 mm polyps in the cecum and at the ileocecal valve, removed with a cold biopsy forceps. Resected and retrieved. - Internal hemorrhoids. - The examination was otherwise normal on direct and retroflexion views. - Moderate diverticulosis in the entire examined colon. There was no evidence of diverticular Bleeding.  Path = tubular adenomas   Review of systems:     No chest pain, no SOB, no fevers, no urinary sx   Past Medical History:  Diagnosis Date  . Aortic stenosis    Echo 8/18: mild LVH, EF 60-65, no RWMA, mild AS (mean 10, peak 19), MAC, trivial MR, normal RVSF  // Echo 01/2019: EF 60-65, Gr 1 DD, mod AS (mean 22, peak 39)   . DIABETES MELLITUS, TYPE II 07/06/2007  . ED (erectile dysfunction)   . Elevated PSA 01/20/2012  . Elevated PSA   . Heart murmur   .  History of nuclear stress test    Myoview 8/18: EF 64, inferobasal thinning, no ischemia, low risk  . HYPERLIPIDEMIA 07/06/2007  . HYPERTENSION 07/06/2007  . HYPOTHYROIDISM 07/06/2007  . Leg pain    ABIs 8/18:  Normal   . Obesity     Patient's surgical history, family medical history, social history, medications and allergies were all reviewed in Epic   Serum creatinine: 1.14 mg/dL 04/21/20 1141 Estimated creatinine clearance: 64.5 mL/min  Current Outpatient Medications  Medication Sig Dispense Refill  . abiraterone acetate  (ZYTIGA) 250 MG tablet TAKE 4 TABLETS (1,000 MG TOTAL) BY MOUTH DAILY. TAKE ON AN EMPTY STOMACH 1 HOUR BEFORE OR 2 HOURS AFTER A MEAL 120 tablet 0  . aspirin EC 81 MG tablet Take 81 mg by mouth daily.    Marland Kitchen atorvastatin (LIPITOR) 40 MG tablet TAKE 1 TABLET EVERY DAY (Patient taking differently: Take 40 mg by mouth daily. TAKE 1 TABLET EVERY DAY) 90 tablet 2  . cholecalciferol (VITAMIN D3) 25 MCG (1000 UT) tablet Take 1,000 Units by mouth daily.    . cloNIDine (CATAPRES) 0.2 MG tablet Take 1 tablet (0.2 mg total) by mouth 2 (two) times daily. 180 tablet 3  . doxazosin (CARDURA) 8 MG tablet TAKE 1 TABLET AT BEDTIME (Patient taking differently: Take 8 mg by mouth at bedtime. TAKE 1 TABLET AT BEDTIME) 90 tablet 3  . furosemide (LASIX) 40 MG tablet TAKE 2 TABLETS (80 MG TOTAL) BY MOUTH EVERY MORNING. (Patient taking differently: Take 80 mg by mouth daily. ) 180 tablet 3  . glipiZIDE (GLUCOTROL XL) 10 MG 24 hr tablet TAKE 1 TABLET TWICE DAILY (Patient taking differently: Take 10 mg by mouth in the morning and at bedtime. TAKE 1 TABLET TWICE DAILY) 180 tablet 0  . labetalol (NORMODYNE) 200 MG tablet TAKE 3 TABLETS EVERY MORNING  AND TAKE 2 TABLETS EVERY EVENING (Patient taking differently: Take 400-600 mg by mouth See admin instructions. TAKE 3 TABLETS EVERY MORNING  AND TAKE 2 TABLETS EVERY EVENING) 450 tablet 2  . levothyroxine (SYNTHROID) 150 MCG tablet TAKE 1 TABLET EVERY DAY (Patient taking differently: Take 150 mcg by mouth daily before breakfast. TAKE 1 TABLET EVERY DAY) 90 tablet 3  . lidocaine-prilocaine (EMLA) cream Apply 1 application topically as needed. (Patient taking differently: Apply 1 application topically as needed (port). ) 30 g 0  . metFORMIN (GLUCOPHAGE) 1000 MG tablet TAKE 1 TABLET TWICE DAILY WITH MEALS 180 tablet 1  . NIFEdipine (PROCARDIA XL/NIFEDICAL XL) 60 MG 24 hr tablet TAKE 1 TABLET EVERY DAY (Patient taking differently: Take 60 mg by mouth daily. TAKE 1 TABLET EVERY DAY) 90 tablet  2  . pioglitazone (ACTOS) 15 MG tablet Take 1 tablet (15 mg total) by mouth daily. 90 tablet 1  . potassium chloride SA (KLOR-CON) 20 MEQ tablet TAKE 1 TABLET TWICE DAILY (Patient taking differently: Take 20 mEq by mouth 2 (two) times daily. TAKE 1 TABLET TWICE DAILY) 180 tablet 3  . predniSONE (DELTASONE) 5 MG tablet TAKE 1 TABLET (5 MG TOTAL) BY MOUTH DAILY WITH BREAKFAST. 90 tablet 3   No current facility-administered medications for this visit.    Filed Weights   04/24/20 0837  Weight: 204 lb (92.5 kg)    Physical Exam:     BP (!) 108/50   Pulse 87   Ht _0  (1.702 m)   Wt 204 lb (92.5 kg)   BMI 31.95 kg/m   GENERAL:  Pleasant male in NAD PSYCH: : Cooperative, normal  affect CARDIAC:  RRR, loud murmur PULM: Normal respiratory effort, lungs CTA bilaterally, no wheezing ABDOMEN:  Nondistended, soft, nontender. No obvious masses, no hepatomegaly,  normal bowel sounds SKIN:  turgor, no lesions seen Musculoskeletal:  Normal muscle tone, normal strength NEURO: Alert and oriented x 3, no focal neurologic deficits   Matthew Serrano , NP 04/24/2020, 8:32 AM

## 2020-04-29 ENCOUNTER — Other Ambulatory Visit: Payer: Self-pay

## 2020-04-29 ENCOUNTER — Inpatient Hospital Stay (HOSPITAL_BASED_OUTPATIENT_CLINIC_OR_DEPARTMENT_OTHER): Payer: Medicare Other | Admitting: Oncology

## 2020-04-29 ENCOUNTER — Inpatient Hospital Stay: Payer: Medicare Other

## 2020-04-29 ENCOUNTER — Inpatient Hospital Stay: Payer: Medicare Other | Attending: Oncology

## 2020-04-29 VITALS — BP 120/59 | HR 98 | Temp 99.7°F | Resp 18 | Ht 67.0 in | Wt 210.2 lb

## 2020-04-29 DIAGNOSIS — E039 Hypothyroidism, unspecified: Secondary | ICD-10-CM | POA: Diagnosis not present

## 2020-04-29 DIAGNOSIS — C61 Malignant neoplasm of prostate: Secondary | ICD-10-CM

## 2020-04-29 DIAGNOSIS — D62 Acute posthemorrhagic anemia: Secondary | ICD-10-CM | POA: Diagnosis not present

## 2020-04-29 DIAGNOSIS — D649 Anemia, unspecified: Secondary | ICD-10-CM | POA: Diagnosis not present

## 2020-04-29 DIAGNOSIS — K5731 Diverticulosis of large intestine without perforation or abscess with bleeding: Secondary | ICD-10-CM | POA: Diagnosis not present

## 2020-04-29 DIAGNOSIS — Z20822 Contact with and (suspected) exposure to covid-19: Secondary | ICD-10-CM | POA: Diagnosis not present

## 2020-04-29 DIAGNOSIS — E119 Type 2 diabetes mellitus without complications: Secondary | ICD-10-CM | POA: Diagnosis not present

## 2020-04-29 DIAGNOSIS — Z66 Do not resuscitate: Secondary | ICD-10-CM | POA: Diagnosis not present

## 2020-04-29 DIAGNOSIS — R000 Tachycardia, unspecified: Secondary | ICD-10-CM | POA: Diagnosis not present

## 2020-04-29 DIAGNOSIS — R509 Fever, unspecified: Secondary | ICD-10-CM | POA: Diagnosis not present

## 2020-04-29 LAB — CBC WITH DIFFERENTIAL (CANCER CENTER ONLY)
Abs Immature Granulocytes: 0.07 10*3/uL (ref 0.00–0.07)
Basophils Absolute: 0 10*3/uL (ref 0.0–0.1)
Basophils Relative: 0 %
Eosinophils Absolute: 0.1 10*3/uL (ref 0.0–0.5)
Eosinophils Relative: 1 %
HCT: 19.8 % — ABNORMAL LOW (ref 39.0–52.0)
Hemoglobin: 6.3 g/dL — CL (ref 13.0–17.0)
Immature Granulocytes: 1 %
Lymphocytes Relative: 10 %
Lymphs Abs: 1 10*3/uL (ref 0.7–4.0)
MCH: 26.4 pg (ref 26.0–34.0)
MCHC: 31.8 g/dL (ref 30.0–36.0)
MCV: 82.8 fL (ref 80.0–100.0)
Monocytes Absolute: 0.7 10*3/uL (ref 0.1–1.0)
Monocytes Relative: 7 %
Neutro Abs: 8.2 10*3/uL — ABNORMAL HIGH (ref 1.7–7.7)
Neutrophils Relative %: 81 %
Platelet Count: 270 10*3/uL (ref 150–400)
RBC: 2.39 MIL/uL — ABNORMAL LOW (ref 4.22–5.81)
RDW: 14.8 % (ref 11.5–15.5)
WBC Count: 10.1 10*3/uL (ref 4.0–10.5)
nRBC: 0 % (ref 0.0–0.2)

## 2020-04-29 LAB — CMP (CANCER CENTER ONLY)
ALT: 13 U/L (ref 0–44)
AST: 17 U/L (ref 15–41)
Albumin: 2.2 g/dL — ABNORMAL LOW (ref 3.5–5.0)
Alkaline Phosphatase: 64 U/L (ref 38–126)
Anion gap: 7 (ref 5–15)
BUN: 7 mg/dL — ABNORMAL LOW (ref 8–23)
CO2: 20 mmol/L — ABNORMAL LOW (ref 22–32)
Calcium: 8.2 mg/dL — ABNORMAL LOW (ref 8.9–10.3)
Chloride: 109 mmol/L (ref 98–111)
Creatinine: 0.87 mg/dL (ref 0.61–1.24)
GFR, Est AFR Am: >60 mL/min (ref >60)
GFR, Estimated: >60 mL/min (ref >60)
Glucose, Bld: 129 mg/dL — ABNORMAL HIGH (ref 70–99)
Potassium: 4.2 mmol/L (ref 3.5–5.1)
Sodium: 136 mmol/L (ref 135–145)
Total Bilirubin: 0.6 mg/dL (ref 0.3–1.2)
Total Protein: 5.5 g/dL — ABNORMAL LOW (ref 6.5–8.1)

## 2020-04-29 MED ORDER — SODIUM CHLORIDE 0.9% FLUSH
10.0000 mL | INTRAVENOUS | Status: DC | PRN
  Administered 2020-04-29: 10 mL
  Filled 2020-04-29: qty 10

## 2020-04-29 MED ORDER — HEPARIN SOD (PORK) LOCK FLUSH 100 UNIT/ML IV SOLN
500.0000 [IU] | Freq: Once | INTRAVENOUS | Status: AC | PRN
  Administered 2020-04-29: 500 [IU]
  Filled 2020-04-29: qty 5

## 2020-04-29 MED ORDER — LEUPROLIDE ACETATE (4 MONTH) 30 MG ~~LOC~~ KIT
PACK | SUBCUTANEOUS | Status: AC
  Filled 2020-04-29: qty 30

## 2020-04-29 MED ORDER — LEUPROLIDE ACETATE (4 MONTH) 30 MG ~~LOC~~ KIT
30.0000 mg | PACK | Freq: Once | SUBCUTANEOUS | Status: AC
  Administered 2020-04-29: 30 mg via SUBCUTANEOUS

## 2020-04-29 NOTE — Progress Notes (Signed)
CRITICAL VALUE STICKER  CRITICAL VALUE: Hemoglobin 6.3  RECEIVER (on-site recipient of call): Meriel Flavors LPN  [place] NOTIFIED:  04/29/20 1009am MESSENGER (representative from lab): Pam MD NOTIFIED:  Dr Alen Blew TIME OF NOTIFICATION: 1026 am  RESPONSE:  Noted. No orders given

## 2020-04-29 NOTE — Patient Instructions (Signed)

## 2020-04-29 NOTE — Progress Notes (Signed)
Hematology and Oncology Follow Up Visit  Matthew Serrano [id number] [birth year] 74 y.o. 04/29/2020 9:52 AM [name], [name], [name], NP   Principle Diagnosis: 74 year old man with castration-resistant prostate cancer diagnosed in 2019.  He presented with PSA of 1170 and a Gleason score 4+5 = 9 and lymphadenopathy.    Prior Therapy:  Status post prostate biopsy obtained on 06/06/2018 which showed high-volume disease with a Gleason score 4+5 = 9.  Taxotere chemotherapy at 75 mg/m every 3 weeks started on 07/07/2018.  He he is here for cycle 6 of therapy.  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  Current therapy:   Eligard 30 mg every 4 months.  He will receive injection on May 6 of 2021 repeated in 4 months.  Zytiga 1000 mg daily started in April 2020.  Interim History: Matthew Serrano is here for a return evaluation.  Since the last visit, he reports few complaints including excessive fatigue and tiredness.  He has reported symptoms of 5 dyspnea on exertion.  He was found to have a heme positive stool after presenting with intermittent dark stools and currently an evaluation by GI.  He has an endoscopy scheduled for this week.  He was started on iron replacement therapy for hemoglobin of 7.3.  He continues to be active and attends to activities of daily living but has slowed down slightly.  He denies any complications related to Zytiga.  Denies any bone pain or pathological fractures.                   Medications: Reviewed without changes. Current Outpatient Medications  Medication Sig Dispense Refill  . abiraterone acetate (ZYTIGA) 250 MG tablet TAKE 4 TABLETS (1,000 MG TOTAL) BY MOUTH DAILY. TAKE ON AN EMPTY STOMACH 1 HOUR BEFORE OR 2 HOURS AFTER A MEAL 120 tablet 0  . aspirin EC 81 MG tablet Take 81 mg by mouth daily.    Marland Kitchen atorvastatin (LIPITOR) 40 MG tablet TAKE 1 TABLET EVERY DAY (Patient taking  differently: Take 40 mg by mouth daily. TAKE 1 TABLET EVERY DAY) 90 tablet 2  . cholecalciferol (VITAMIN D3) 25 MCG (1000 UT) tablet Take 1,000 Units by mouth daily.    . cloNIDine (CATAPRES) 0.2 MG tablet Take 1 tablet (0.2 mg total) by mouth 2 (two) times daily. 180 tablet 3  . doxazosin (CARDURA) 8 MG tablet TAKE 1 TABLET AT BEDTIME (Patient taking differently: Take 8 mg by mouth at bedtime. TAKE 1 TABLET AT BEDTIME) 90 tablet 3  . ferrous sulfate 325 (65 FE) MG EC tablet Take 1 tablet (325 mg total) by mouth 2 (two) times daily before a meal. 60 tablet 2  . glipiZIDE (GLUCOTROL XL) 10 MG 24 hr tablet TAKE 1 TABLET TWICE DAILY (Patient taking differently: Take 10 mg by mouth in the morning and at bedtime. TAKE 1 TABLET TWICE DAILY) 180 tablet 0  . labetalol (NORMODYNE) 200 MG tablet TAKE 3 TABLETS EVERY MORNING  AND TAKE 2 TABLETS EVERY EVENING (Patient taking differently: Take 400-600 mg by mouth See admin instructions. TAKE 3 TABLETS EVERY MORNING  AND TAKE 2 TABLETS EVERY EVENING) 450 tablet 2  . levothyroxine (SYNTHROID) 150 MCG tablet TAKE 1 TABLET EVERY DAY (Patient taking differently: Take 150 mcg by mouth daily before breakfast. TAKE 1 TABLET EVERY DAY) 90 tablet 3  . lidocaine-prilocaine (EMLA) cream Apply 1 application topically as needed. (Patient taking differently: Apply 1 application topically as needed (  port). ) 30 g 0  . metFORMIN (GLUCOPHAGE) 1000 MG tablet TAKE 1 TABLET TWICE DAILY WITH MEALS 180 tablet 1  . NIFEdipine (PROCARDIA XL/NIFEDICAL XL) 60 MG 24 hr tablet TAKE 1 TABLET EVERY DAY (Patient taking differently: Take 60 mg by mouth daily. TAKE 1 TABLET EVERY DAY) 90 tablet 2  . omeprazole (PRILOSEC) 40 MG capsule Take 1 capsule (40 mg total) by mouth daily. 30 capsule 2  . pioglitazone (ACTOS) 15 MG tablet Take 1 tablet (15 mg total) by mouth daily. 90 tablet 1  . potassium chloride SA (KLOR-CON) 20 MEQ tablet TAKE 1 TABLET TWICE DAILY (Patient taking differently: Take 20 mEq  by mouth 2 (two) times daily. TAKE 1 TABLET TWICE DAILY) 180 tablet 3  . predniSONE (DELTASONE) 5 MG tablet TAKE 1 TABLET (5 MG TOTAL) BY MOUTH DAILY WITH BREAKFAST. 90 tablet 3   No current facility-administered medications for this visit.   Facility-Administered Medications Ordered in Other Visits  Medication Dose Route Frequency Provider Last Rate Last Admin  . sodium chloride flush (NS) 0.9 % injection 10 mL  10 mL Intracatheter PRN Wyatt Portela, MD   10 mL at 04/29/20 0948     Allergies: No Known Allergies      Physical Exam:   Blood pressure (!) 120/59, pulse 98, temperature 99.7 F (37.6 C), temperature source Tympanic, resp. rate 18, height 5\' 7"  (1.702 m), weight 210 lb 3.2 oz (95.3 kg), SpO2 100 %.       ECOG: 0     General appearance: Comfortable appearing without any discomfort Head: Normocephalic without any trauma Oropharynx: Mucous membranes are moist and pink without any thrush or ulcers. Eyes: Pupils are equal and round reactive to light. Lymph nodes: No cervical, supraclavicular, inguinal or axillary lymphadenopathy.   Heart:regular rate and rhythm.  S1 and S2 without leg edema. Lung: Clear without any rhonchi or wheezes.  No dullness to percussion. Abdomin: Soft, nontender, nondistended with good bowel sounds.  No hepatosplenomegaly. Musculoskeletal: No joint deformity or effusion.  Full range of motion noted. Neurological: No deficits noted on motor, sensory and deep tendon reflex exam. Skin: No petechial rash or dryness.  Appeared moist.              Lab Results: Lab Results  Component Value Date   WBC 9.0 04/21/2020   HGB 7.3 (L) 04/21/2020   HCT 22.7 (L) 04/21/2020   MCV 82 04/21/2020   PLT 243 04/21/2020     Chemistry      Component Value Date/Time   NA 139 04/21/2020 1141   K 3.4 (L) 04/21/2020 1141   CL 101 04/21/2020 1141   CO2 20 04/21/2020 1141   BUN 20 04/21/2020 1141   CREATININE 1.14 04/21/2020 1141    CREATININE 0.87 12/27/2019 0947      Component Value Date/Time   CALCIUM 8.4 (L) 04/21/2020 1141   ALKPHOS 56 12/27/2019 0947   AST 15 12/27/2019 0947   ALT 15 12/27/2019 0947   BILITOT 0.5 12/27/2019 0947        Results for Serrano, Matthew MAU (MRN [id number]) as of 04/29/2020 09:54  Ref. Range 08/29/2019 09:02 12/27/2019 09:47  Prostate Specific Ag, Serum Latest Ref Range: 0.0 - 4.0 ng/mL <0.1 <0.1             Impression and Plan:  74 year old man with:  1.  Advanced prostate cancer with lymphadenopathy diagnosed in 2019.  He has castration-resistant disease.      He  continues to have excellent PSA response with Zytiga.  Risks and benefits of continuing this treatment and alternative treatment options were reiterated.  He can include systemic chemotherapy, Trudi Ida among others.  For the time being I recommended continuing Zytiga without any dose adjustment or changes.  2.  IV access: Port-A-Cath remains in place and will be in use and flushed periodically.  3.  Androgen deprivation: He is currently on Eligard and received it in May 2021 and will be repeated today.  Risks and benefits of continuing this treatment were discussed today.  Weight gain, hot flashes among others were reviewed.  He is agreeable to continue.  5.  Goals of care and prognosis: Therapy remains palliative although aggressive measures are warranted given his excellent performance status.  6.  Hypokalemia: Potassium continues to be borderline low but overall under control with replacement.  7.  Hypertension: Blood pressure close to normal range at this time.  8.  Iron deficiency anemia: Related to GI blood losses and currently under evaluation for an endoscopy.  Is currently on iron replacement.  Risks and benefits of intravenous iron were reviewed today but he declined that option.  I recommended continuing oral iron for the time being.  9.  Follow-up: He will return in 4 months for follow-up.  30   minutes were s dedicated to this visit.  The time was spent on reviewing laboratory data, disease status update and treatment options for the future.      [name], MD 9/7/20219:52 AM

## 2020-04-30 ENCOUNTER — Other Ambulatory Visit: Payer: Self-pay | Admitting: Nurse Practitioner

## 2020-04-30 ENCOUNTER — Telehealth: Payer: Self-pay

## 2020-04-30 LAB — PROSTATE-SPECIFIC AG, SERUM (LABCORP): Prostate Specific Ag, Serum: <0.1 ng/mL (ref 0.0–4.0)

## 2020-04-30 NOTE — Telephone Encounter (Signed)
Called patient and made him aware of PSA result. Patient verbalized understanding.  

## 2020-04-30 NOTE — Telephone Encounter (Signed)
-----   Message from Wyatt Portela, MD sent at 04/30/2020  8:53 AM EDT ----- Please let him know his PSA is still low

## 2020-05-02 ENCOUNTER — Inpatient Hospital Stay (HOSPITAL_COMMUNITY)
Admission: EM | Admit: 2020-05-02 | Discharge: 2020-05-04 | DRG: 378 | Disposition: A | Discharge status: Home / Self Care | Payer: Medicare Other | Attending: Internal Medicine | Admitting: Internal Medicine

## 2020-05-02 ENCOUNTER — Encounter (HOSPITAL_COMMUNITY): Payer: Self-pay | Admitting: Emergency Medicine

## 2020-05-02 ENCOUNTER — Inpatient Hospital Stay (HOSPITAL_COMMUNITY): Payer: Medicare Other | Admitting: Anesthesiology

## 2020-05-02 ENCOUNTER — Telehealth: Payer: Self-pay | Admitting: Internal Medicine

## 2020-05-02 ENCOUNTER — Encounter: Payer: Self-pay | Admitting: Internal Medicine

## 2020-05-02 ENCOUNTER — Other Ambulatory Visit: Payer: Self-pay

## 2020-05-02 ENCOUNTER — Ambulatory Visit: Payer: Medicare Other | Admitting: Internal Medicine

## 2020-05-02 ENCOUNTER — Emergency Department (HOSPITAL_COMMUNITY): Payer: Medicare Other

## 2020-05-02 ENCOUNTER — Encounter (HOSPITAL_COMMUNITY)
Admission: EM | Disposition: A | Discharge status: Home / Self Care | Payer: Self-pay | Source: Home / Self Care | Attending: Internal Medicine

## 2020-05-02 DIAGNOSIS — K921 Melena: Secondary | ICD-10-CM

## 2020-05-02 DIAGNOSIS — E876 Hypokalemia: Secondary | ICD-10-CM | POA: Diagnosis not present

## 2020-05-02 DIAGNOSIS — D649 Anemia, unspecified: Secondary | ICD-10-CM

## 2020-05-02 DIAGNOSIS — Z7982 Long term (current) use of aspirin: Secondary | ICD-10-CM | POA: Diagnosis not present

## 2020-05-02 DIAGNOSIS — R195 Other fecal abnormalities: Secondary | ICD-10-CM

## 2020-05-02 DIAGNOSIS — Z8546 Personal history of malignant neoplasm of prostate: Secondary | ICD-10-CM | POA: Diagnosis not present

## 2020-05-02 DIAGNOSIS — K5730 Diverticulosis of large intestine without perforation or abscess without bleeding: Secondary | ICD-10-CM | POA: Diagnosis not present

## 2020-05-02 DIAGNOSIS — I350 Nonrheumatic aortic (valve) stenosis: Secondary | ICD-10-CM | POA: Diagnosis present

## 2020-05-02 DIAGNOSIS — E119 Type 2 diabetes mellitus without complications: Secondary | ICD-10-CM | POA: Diagnosis not present

## 2020-05-02 DIAGNOSIS — K922 Gastrointestinal hemorrhage, unspecified: Secondary | ICD-10-CM | POA: Diagnosis not present

## 2020-05-02 DIAGNOSIS — Z6832 Body mass index (BMI) 32.0-32.9, adult: Secondary | ICD-10-CM

## 2020-05-02 DIAGNOSIS — D62 Acute posthemorrhagic anemia: Secondary | ICD-10-CM | POA: Diagnosis present

## 2020-05-02 DIAGNOSIS — D638 Anemia in other chronic diseases classified elsewhere: Secondary | ICD-10-CM | POA: Diagnosis present

## 2020-05-02 DIAGNOSIS — E039 Hypothyroidism, unspecified: Secondary | ICD-10-CM | POA: Diagnosis present

## 2020-05-02 DIAGNOSIS — K5520 Angiodysplasia of colon without hemorrhage: Secondary | ICD-10-CM | POA: Diagnosis not present

## 2020-05-02 DIAGNOSIS — R509 Fever, unspecified: Secondary | ICD-10-CM | POA: Diagnosis not present

## 2020-05-02 DIAGNOSIS — E02 Subclinical iodine-deficiency hypothyroidism: Secondary | ICD-10-CM | POA: Diagnosis not present

## 2020-05-02 DIAGNOSIS — E785 Hyperlipidemia, unspecified: Secondary | ICD-10-CM | POA: Diagnosis present

## 2020-05-02 DIAGNOSIS — K449 Diaphragmatic hernia without obstruction or gangrene: Secondary | ICD-10-CM | POA: Diagnosis present

## 2020-05-02 DIAGNOSIS — K5731 Diverticulosis of large intestine without perforation or abscess with bleeding: Principal | ICD-10-CM | POA: Diagnosis present

## 2020-05-02 DIAGNOSIS — Z79890 Hormone replacement therapy (postmenopausal): Secondary | ICD-10-CM | POA: Diagnosis not present

## 2020-05-02 DIAGNOSIS — Z20822 Contact with and (suspected) exposure to covid-19: Secondary | ICD-10-CM | POA: Diagnosis not present

## 2020-05-02 DIAGNOSIS — Z87891 Personal history of nicotine dependence: Secondary | ICD-10-CM

## 2020-05-02 DIAGNOSIS — Z66 Do not resuscitate: Secondary | ICD-10-CM | POA: Diagnosis present

## 2020-05-02 DIAGNOSIS — Z7952 Long term (current) use of systemic steroids: Secondary | ICD-10-CM

## 2020-05-02 DIAGNOSIS — E0800 Diabetes mellitus due to underlying condition with hyperosmolarity without nonketotic hyperglycemic-hyperosmolar coma (NKHHC): Secondary | ICD-10-CM | POA: Diagnosis not present

## 2020-05-02 DIAGNOSIS — Z794 Long term (current) use of insulin: Secondary | ICD-10-CM | POA: Diagnosis not present

## 2020-05-02 DIAGNOSIS — I10 Essential (primary) hypertension: Secondary | ICD-10-CM | POA: Diagnosis present

## 2020-05-02 DIAGNOSIS — K648 Other hemorrhoids: Secondary | ICD-10-CM | POA: Diagnosis present

## 2020-05-02 DIAGNOSIS — Z7984 Long term (current) use of oral hypoglycemic drugs: Secondary | ICD-10-CM

## 2020-05-02 DIAGNOSIS — R000 Tachycardia, unspecified: Secondary | ICD-10-CM | POA: Diagnosis not present

## 2020-05-02 HISTORY — PX: ENTEROSCOPY: SHX5533

## 2020-05-02 HISTORY — PX: SUBMUCOSAL INJECTION: SHX5543

## 2020-05-02 LAB — URINALYSIS, ROUTINE W REFLEX MICROSCOPIC
Bilirubin Urine: NEGATIVE
Glucose, UA: NEGATIVE mg/dL
Ketones, ur: NEGATIVE mg/dL
Leukocytes,Ua: NEGATIVE
Nitrite: NEGATIVE
Protein, ur: NEGATIVE mg/dL
Specific Gravity, Urine: 1.006 (ref 1.005–1.030)
pH: 5 (ref 5.0–8.0)

## 2020-05-02 LAB — HEMOGLOBIN AND HEMATOCRIT, BLOOD
HCT: 24.3 % — ABNORMAL LOW (ref 39.0–52.0)
Hemoglobin: 8 g/dL — ABNORMAL LOW (ref 13.0–17.0)

## 2020-05-02 LAB — COMPREHENSIVE METABOLIC PANEL
ALT: 18 U/L (ref 0–44)
AST: 23 U/L (ref 15–41)
Albumin: 2.6 g/dL — ABNORMAL LOW (ref 3.5–5.0)
Alkaline Phosphatase: 64 U/L (ref 38–126)
Anion gap: 13 (ref 5–15)
BUN: 5 mg/dL — ABNORMAL LOW (ref 8–23)
CO2: 18 mmol/L — ABNORMAL LOW (ref 22–32)
Calcium: 8.2 mg/dL — ABNORMAL LOW (ref 8.9–10.3)
Chloride: 109 mmol/L (ref 98–111)
Creatinine, Ser: 0.86 mg/dL (ref 0.61–1.24)
GFR calc Af Amer: >60 mL/min (ref >60)
GFR calc non Af Amer: >60 mL/min (ref >60)
Glucose, Bld: 151 mg/dL — ABNORMAL HIGH (ref 70–99)
Potassium: 3.3 mmol/L — ABNORMAL LOW (ref 3.5–5.1)
Sodium: 140 mmol/L (ref 135–145)
Total Bilirubin: 0.8 mg/dL (ref 0.3–1.2)
Total Protein: 5.9 g/dL — ABNORMAL LOW (ref 6.5–8.1)

## 2020-05-02 LAB — CBC WITH DIFFERENTIAL/PLATELET
Abs Immature Granulocytes: 0.07 10*3/uL (ref 0.00–0.07)
Basophils Absolute: 0 10*3/uL (ref 0.0–0.1)
Basophils Relative: 0 %
Eosinophils Absolute: 0 10*3/uL (ref 0.0–0.5)
Eosinophils Relative: 0 %
HCT: 21 % — ABNORMAL LOW (ref 39.0–52.0)
Hemoglobin: 6.7 g/dL — CL (ref 13.0–17.0)
Immature Granulocytes: 1 %
Lymphocytes Relative: 12 %
Lymphs Abs: 1.3 10*3/uL (ref 0.7–4.0)
MCH: 26.5 pg (ref 26.0–34.0)
MCHC: 31.9 g/dL (ref 30.0–36.0)
MCV: 83 fL (ref 80.0–100.0)
Monocytes Absolute: 0.7 10*3/uL (ref 0.1–1.0)
Monocytes Relative: 7 %
Neutro Abs: 8.7 10*3/uL — ABNORMAL HIGH (ref 1.7–7.7)
Neutrophils Relative %: 80 %
Platelets: 317 10*3/uL (ref 150–400)
RBC: 2.53 MIL/uL — ABNORMAL LOW (ref 4.22–5.81)
RDW: 14.8 % (ref 11.5–15.5)
WBC: 10.9 10*3/uL — ABNORMAL HIGH (ref 4.0–10.5)
nRBC: 0 % (ref 0.0–0.2)

## 2020-05-02 LAB — GLUCOSE, CAPILLARY: Glucose-Capillary: 141 mg/dL — ABNORMAL HIGH (ref 70–99)

## 2020-05-02 LAB — SARS CORONAVIRUS 2 BY RT PCR (HOSPITAL ORDER, PERFORMED IN ~~LOC~~ HOSPITAL LAB): SARS Coronavirus 2: NEGATIVE

## 2020-05-02 LAB — PROTIME-INR
INR: 1.2 (ref 0.8–1.2)
Prothrombin Time: 14.3 seconds (ref 11.4–15.2)

## 2020-05-02 LAB — PREPARE RBC (CROSSMATCH)

## 2020-05-02 LAB — HEMOGLOBIN A1C
Hgb A1c MFr Bld: 6.8 % — ABNORMAL HIGH (ref 4.8–5.6)
Mean Plasma Glucose: 148.46 mg/dL

## 2020-05-02 LAB — CBG MONITORING, ED
Glucose-Capillary: 117 mg/dL — ABNORMAL HIGH (ref 70–99)
Glucose-Capillary: 119 mg/dL — ABNORMAL HIGH (ref 70–99)

## 2020-05-02 LAB — ABO/RH: ABO/RH(D): A POS

## 2020-05-02 SURGERY — ENTEROSCOPY
Anesthesia: Monitor Anesthesia Care

## 2020-05-02 MED ORDER — SODIUM CHLORIDE 0.9 % IV SOLN: 250.0000 mL | INTRAVENOUS | Status: DC | PRN

## 2020-05-02 MED ORDER — SODIUM CHLORIDE 0.9 % IV SOLN: 500.0000 mL | INTRAVENOUS | Status: DC

## 2020-05-02 MED ORDER — HYDROCORTISONE NA SUCCINATE PF 100 MG IJ SOLR
20.0000 mg | Freq: Every day | INTRAMUSCULAR | Status: DC
  Administered 2020-05-02: 20 mg via INTRAVENOUS
  Filled 2020-05-02: qty 2

## 2020-05-02 MED ORDER — POTASSIUM CHLORIDE IN NACL 40-0.9 MEQ/L-% IV SOLN
INTRAVENOUS | Status: DC
  Filled 2020-05-02 (×2): qty 1000

## 2020-05-02 MED ORDER — SPOT INK MARKER SYRINGE KIT
PACK | SUBMUCOSAL | Status: AC
  Filled 2020-05-02: qty 5

## 2020-05-02 MED ORDER — BISACODYL 5 MG PO TBEC
10.0000 mg | DELAYED_RELEASE_TABLET | Freq: Once | ORAL | Status: AC
  Administered 2020-05-03: 10 mg via ORAL
  Filled 2020-05-02 (×2): qty 2

## 2020-05-02 MED ORDER — ACETAMINOPHEN 325 MG PO TABS
650.0000 mg | ORAL_TABLET | Freq: Once | ORAL | Status: AC
  Administered 2020-05-02: 650 mg via ORAL
  Filled 2020-05-02: qty 2

## 2020-05-02 MED ORDER — SODIUM CHLORIDE 0.9 % IV SOLN: INTRAVENOUS | Status: DC

## 2020-05-02 MED ORDER — SPOT INK MARKER SYRINGE KIT
PACK | SUBMUCOSAL | Status: DC | PRN
  Administered 2020-05-02: 1 mL via SUBMUCOSAL

## 2020-05-02 MED ORDER — BISACODYL 5 MG PO TBEC
10.0000 mg | DELAYED_RELEASE_TABLET | Freq: Once | ORAL | Status: DC
  Filled 2020-05-02: qty 2

## 2020-05-02 MED ORDER — ONDANSETRON HCL 4 MG/2ML IJ SOLN: 4.0000 mg | Freq: Four times a day (QID) | INTRAMUSCULAR | Status: DC | PRN

## 2020-05-02 MED ORDER — PEG-KCL-NACL-NASULF-NA ASC-C 100 G PO SOLR
0.5000 | Freq: Once | ORAL | Status: AC
  Administered 2020-05-03: 100 g via ORAL

## 2020-05-02 MED ORDER — SODIUM CHLORIDE 0.9% FLUSH: 3.0000 mL | INTRAVENOUS | Status: DC | PRN

## 2020-05-02 MED ORDER — FERROUS SULFATE 325 (65 FE) MG PO TABS
325.0000 mg | ORAL_TABLET | Freq: Two times a day (BID) | ORAL | Status: DC
  Filled 2020-05-02: qty 1

## 2020-05-02 MED ORDER — ONDANSETRON HCL 4 MG PO TABS: 4.0000 mg | ORAL_TABLET | Freq: Four times a day (QID) | ORAL | Status: DC | PRN

## 2020-05-02 MED ORDER — PROPOFOL 10 MG/ML IV BOLUS
INTRAVENOUS | Status: DC | PRN
  Administered 2020-05-02 (×4): 20 mg via INTRAVENOUS

## 2020-05-02 MED ORDER — SODIUM CHLORIDE 0.9% FLUSH
3.0000 mL | Freq: Two times a day (BID) | INTRAVENOUS | Status: DC
  Administered 2020-05-02 (×2): 3 mL via INTRAVENOUS

## 2020-05-02 MED ORDER — SODIUM CHLORIDE 0.9 % IV SOLN
80.0000 mg | Freq: Once | INTRAVENOUS | Status: AC
  Administered 2020-05-02: 80 mg via INTRAVENOUS
  Filled 2020-05-02: qty 72

## 2020-05-02 MED ORDER — SODIUM CHLORIDE 0.9% IV SOLUTION: Freq: Once | INTRAVENOUS | Status: DC

## 2020-05-02 MED ORDER — INSULIN ASPART 100 UNIT/ML ~~LOC~~ SOLN
0.0000 [IU] | SUBCUTANEOUS | Status: DC
  Administered 2020-05-02: 2 [IU] via SUBCUTANEOUS
  Administered 2020-05-03: 3 [IU] via SUBCUTANEOUS
  Administered 2020-05-04: 5 [IU] via SUBCUTANEOUS
  Administered 2020-05-04: 2 [IU] via SUBCUTANEOUS
  Filled 2020-05-02: qty 0.15

## 2020-05-02 MED ORDER — SODIUM CHLORIDE 0.9 % IV SOLN
10.0000 mL/h | Freq: Once | INTRAVENOUS | Status: AC
  Administered 2020-05-02: 10 mL/h via INTRAVENOUS

## 2020-05-02 MED ORDER — PEG-KCL-NACL-NASULF-NA ASC-C 100 G PO SOLR
0.5000 | Freq: Once | ORAL | Status: AC
  Administered 2020-05-02: 100 g via ORAL
  Filled 2020-05-02: qty 1

## 2020-05-02 MED ORDER — SODIUM CHLORIDE 0.9 % IV BOLUS
500.0000 mL | Freq: Once | INTRAVENOUS | Status: AC
  Administered 2020-05-02: 500 mL via INTRAVENOUS

## 2020-05-02 MED ORDER — LIDOCAINE 2% (20 MG/ML) 5 ML SYRINGE
INTRAMUSCULAR | Status: DC | PRN
  Administered 2020-05-02: 60 mg via INTRAVENOUS

## 2020-05-02 MED ORDER — PREDNISONE 5 MG PO TABS: 5.0000 mg | ORAL_TABLET | Freq: Every day | ORAL | Status: DC

## 2020-05-02 MED ORDER — PROPOFOL 500 MG/50ML IV EMUL
INTRAVENOUS | Status: DC | PRN
  Administered 2020-05-02: 125 ug/kg/min via INTRAVENOUS

## 2020-05-02 SURGICAL SUPPLY — 15 items

## 2020-05-02 NOTE — H&P (Addendum)
History and Physical    Matthew Serrano. WER:[id number] DOB: 02-05-1946 DOA: 05/02/2020  PCP: [name], NP   Patient coming from: Home  I have personally briefly reviewed patient's old medical records in [place]  Chief Complaint: Weakness  HPI: Matthew Serrano. is a 74 y.o. male with medical history significant for aortic stenosis, prostate cancer, diabetes mellitus, hypertension and hypothyroidism who was sent to the emergency room for evaluation of passage of melena stools as well as symptomatic anemia. Patient states that he has been passing dark stools for about a month and now complains of feeling dizzy, lightheaded and having generalized fatigue.  He also complains of exertional shortness of breath. He takes aspirin 81 mg daily but denies any other NSAID use. He denies having any chest pain, no abdominal pain, no cough, no urinary symptoms . Patient is fully vaccinated for the coronavirus and denies having any exposure to anyone who has COVID-19 Upon arrival to the ER he was found to have a fever with a T-max of 101.69F. Labs show sodium 140, potassium 3.3, chloride 109, bicarb 18, glucose 151, BUN 5, creatinine 0.86, calcium 8.2, AST 23, ALT 18, white count 10.9 with a left shift, hemoglobin 6.7 >> 9.2, hematocrit 21, MCV 83, RDW 14.8, platelet count 317 Chest x-ray reviewed by me shows no acute findings Twelve-lead EKG reviewed by me shows sinus tachycardia  ED Course: Patient is a 74 year old African-American male who presents to the ER for evaluation of passage of melena stools for over a month.  He complains of symptoms of dizziness, lightheadedness, generalized fatigue and exertional shortness of breath.  His hemoglobin today is 6.7g/dl down from 9.2g/dl. He will be admitted to the hospital for further evaluation.  Review of Systems: As per HPI otherwise 10 point review of systems negative.    Past Medical History:  Diagnosis Date  . Aortic stenosis     Echo 8/18: mild LVH, EF 60-65, no RWMA, mild AS (mean 10, peak 19), MAC, trivial MR, normal RVSF  // Echo 01/2019: EF 60-65, Gr 1 DD, mod AS (mean 22, peak 39)   . Cancer [facility])    prostate cancer  . DIABETES MELLITUS, TYPE II 07/06/2007  . ED (erectile dysfunction)   . Elevated PSA 01/20/2012  . Elevated PSA   . Heart murmur   . History of nuclear stress test    Myoview 8/18: EF 64, inferobasal thinning, no ischemia, low risk  . HYPERLIPIDEMIA 07/06/2007  . HYPERTENSION 07/06/2007  . HYPOTHYROIDISM 07/06/2007  . Leg pain    ABIs 8/18:  Normal   . Obesity     Past Surgical History:  Procedure Laterality Date  . COLONOSCOPY    . COLONOSCOPY W/ BIOPSIES AND POLYPECTOMY    . IR IMAGING GUIDED PORT INSERTION  07/05/2018  . POLYPECTOMY    . RIGHT/LEFT HEART CATH AND CORONARY ANGIOGRAPHY N/A 02/13/2020   Procedure: RIGHT/LEFT HEART CATH AND CORONARY ANGIOGRAPHY;  Surgeon: Belva Crome, MD;  Location: [place] [facility];  Service: Cardiovascular;  Laterality: N/A;     reports that he has quit smoking. He has never used smokeless tobacco. He reports previous alcohol use of about 21.0 standard drinks of alcohol per week. He reports that he does not use drugs.  No Known Allergies  Family History  Problem Relation Age of Onset  . Hypertension Other   . Breast cancer Sister   . Breast cancer Other  family history  . Breast cancer Brother        prostate ca  . Aneurysm Mother        Brain aneurysm   . Prostate cancer Father   . Colon cancer Neg Hx   . Esophageal cancer Neg Hx   . Stomach cancer Neg Hx   . Rectal cancer Neg Hx      Prior to Admission medications   Medication Sig Start Date End Date Taking? Authorizing Provider  abiraterone acetate (ZYTIGA) 250 MG tablet TAKE 4 TABLETS (1,000 MG TOTAL) BY MOUTH DAILY. TAKE ON AN EMPTY STOMACH 1 HOUR BEFORE OR 2 HOURS AFTER A MEAL 04/16/20  Yes Wyatt Portela, MD  aspirin EC 81 MG tablet Take 81 mg by mouth daily.   Yes  [provider]  atorvastatin (LIPITOR) 40 MG tablet TAKE 1 TABLET EVERY DAY Patient taking differently: Take 40 mg by mouth daily. TAKE 1 TABLET EVERY DAY 11/07/19  Yes [name], [name], NP  cholecalciferol (VITAMIN D3) 25 MCG (1000 UT) tablet Take 1,000 Units by mouth daily.   Yes [provider]  cloNIDine (CATAPRES) 0.2 MG tablet Take 1 tablet (0.2 mg total) by mouth 2 (two) times daily. 04/17/20 04/12/21 Yes Eileen Stanford, PA-C  doxazosin (CARDURA) 8 MG tablet TAKE 1 TABLET AT BEDTIME Patient taking differently: Take 8 mg by mouth at bedtime. TAKE 1 TABLET AT BEDTIME 08/01/19  Yes [name], [name], NP  ferrous sulfate 325 (65 FE) MG EC tablet Take 1 tablet (325 mg total) by mouth 2 (two) times daily before a meal. 04/24/20  Yes Willia Craze, NP  glipiZIDE (GLUCOTROL XL) 10 MG 24 hr tablet TAKE 1 TABLET TWICE DAILY Patient taking differently: Take 10 mg by mouth in the morning and at bedtime. TAKE 1 TABLET TWICE DAILY 01/16/20  Yes [name], [name], NP  labetalol (NORMODYNE) 200 MG tablet TAKE 3 TABLETS EVERY MORNING  AND TAKE 2 TABLETS EVERY EVENING Patient taking differently: Take 400-600 mg by mouth See admin instructions. TAKE 3 TABLETS EVERY MORNING  AND TAKE 2 TABLETS EVERY EVENING 11/07/19  Yes [name], [name], NP  levothyroxine (SYNTHROID) 150 MCG tablet TAKE 1 TABLET EVERY DAY Patient taking differently: Take 150 mcg by mouth daily before breakfast. TAKE 1 TABLET EVERY DAY 08/01/19  Yes [name], [name], NP  lidocaine-prilocaine (EMLA) cream Apply 1 application topically as needed. Patient taking differently: Apply 1 application topically as needed (port).  06/23/18  Yes Wyatt Portela, MD  metFORMIN (GLUCOPHAGE) 1000 MG tablet TAKE 1 TABLET TWICE DAILY WITH MEALS Patient taking differently: Take 1,000 mg by mouth 2 (two) times daily with a meal.  04/02/20  Yes [name], [name], NP  NIFEdipine (PROCARDIA XL/NIFEDICAL XL) 60 MG 24 hr tablet TAKE 1 TABLET EVERY DAY Patient  taking differently: Take 60 mg by mouth daily. TAKE 1 TABLET EVERY DAY 11/07/19  Yes [name], [name], NP  omeprazole (PRILOSEC) 40 MG capsule TAKE 1 CAPSULE BY MOUTH EVERY DAY Patient taking differently: Take 40 mg by mouth daily.  04/30/20  Yes Willia Craze, NP  pioglitazone (ACTOS) 15 MG tablet Take 1 tablet (15 mg total) by mouth daily. 10/23/19  Yes [name], [name], NP  potassium chloride SA (KLOR-CON) 20 MEQ tablet TAKE 1 TABLET TWICE DAILY Patient taking differently: Take 20 mEq by mouth 2 (two) times daily. TAKE 1 TABLET TWICE DAILY 08/15/19  Yes [name], [name], NP  predniSONE (DELTASONE) 5 MG tablet TAKE 1 TABLET (5 MG TOTAL) BY MOUTH DAILY WITH  BREAKFAST. 10/17/19  Yes Wyatt Portela, MD    Physical Exam: Vitals:   05/02/20 1005 05/02/20 1006 05/02/20 1132  BP: 130/72    Pulse: (!) 124    Resp: 20    Temp: (!) 101.6 F (38.7 C)  99.8 F (37.7 C)  TempSrc: Oral  Oral  SpO2: 99%    Weight:  95.3 kg   Height:  _0  (1.702 m)      Vitals:   05/02/20 1005 05/02/20 1006 05/02/20 1132  BP: 130/72    Pulse: (!) 124    Resp: 20    Temp: (!) 101.6 F (38.7 C)  99.8 F (37.7 C)  TempSrc: Oral  Oral  SpO2: 99%    Weight:  95.3 kg   Height:  _1  (1.702 m)     Constitutional: NAD, alert and oriented x 3.  Chronically ill-appearing Eyes: PERRL, lids and conjunctivae pallor ENMT: Mucous membranes are moist.  Neck: normal, supple, no masses, no thyromegaly Respiratory: clear to auscultation bilaterally, no wheezing, no crackles. Normal respiratory effort. No accessory muscle use.  Cardiovascular: Tachycardic, systolic murmur,  no murmurs / rubs / gallops. 3+ extremity edema. 2+ pedal pulses. No carotid bruits.  Abdomen: no tenderness, no masses palpated. No hepatosplenomegaly. Bowel sounds positive.  Central adiposity Musculoskeletal: no clubbing / cyanosis. No joint deformity upper and lower extremities.  Skin: no rashes, lesions, ulcers.  Neurologic: No gross focal  neurologic deficit.  Generalized weakness Psychiatric: Normal mood and affect.   Labs on Admission: I have personally reviewed following labs and imaging studies  CBC: Recent Labs  Lab 04/29/20 0948 05/02/20 1019  WBC 10.1 10.9*  NEUTROABS 8.2* 8.7*  HGB 6.3* 6.7*  HCT 19.8* 21.0*  MCV 82.8 83.0  PLT 270 176   Basic Metabolic Panel: Recent Labs  Lab 04/29/20 0948 05/02/20 1019  NA 136 140  K 4.2 3.3*  CL 109 109  CO2 20* 18*  GLUCOSE 129* 151*  BUN 7* 5*  CREATININE 0.87 0.86  CALCIUM 8.2* 8.2*   GFR: Estimated Creatinine Clearance: 85.4 mL/min (by C-G formula based on SCr of 0.86 mg/dL). Liver Function Tests: Recent Labs  Lab 04/29/20 0948 05/02/20 1019  AST 17 23  ALT 13 18  ALKPHOS 64 64  BILITOT 0.6 0.8  PROT 5.5* 5.9*  ALBUMIN 2.2* 2.6*   No results for input(s): LIPASE, AMYLASE in the last 168 hours. No results for input(s): AMMONIA in the last 168 hours. Coagulation Profile: Recent Labs  Lab 05/02/20 1019  INR 1.2   Cardiac Enzymes: No results for input(s): CKTOTAL, CKMB, CKMBINDEX, TROPONINI in the last 168 hours. BNP (last 3 results) No results for input(s): PROBNP in the last 8760 hours. HbA1C: No results for input(s): HGBA1C in the last 72 hours. CBG: No results for input(s): GLUCAP in the last 168 hours. Lipid Profile: No results for input(s): CHOL, HDL, LDLCALC, TRIG, CHOLHDL, LDLDIRECT in the last 72 hours. Thyroid Function Tests: No results for input(s): TSH, T4TOTAL, FREET4, T3FREE, THYROIDAB in the last 72 hours. Anemia Panel: No results for input(s): VITAMINB12, FOLATE, FERRITIN, TIBC, IRON, RETICCTPCT in the last 72 hours. Urine analysis:    Component Value Date/Time   COLORURINE yellow 12/25/2009 0947   APPEARANCEUR Clear 12/25/2009 0947   LABSPEC 1.020 12/25/2009 [place] 5.5 12/25/2009 0947   HGBUR trace-intact 12/25/2009 0947   BILIRUBINUR n 04/28/2016 0944   PROTEINUR n 04/28/2016 0944   UROBILINOGEN 0.2  04/28/2016 0944   UROBILINOGEN  0.2 12/25/2009 0947   NITRITE n 04/28/2016 0944   NITRITE negative 12/25/2009 0947   LEUKOCYTESUR Negative 04/28/2016 0944    Radiological Exams on Admission: DG Chest Port 1 View  Result Date: 05/02/2020 CLINICAL DATA:  Fever EXAM: PORTABLE CHEST 1 VIEW COMPARISON:  CT angiogram chest February 20, 2020. FINDINGS: The lungs are clear. Heart size is upper normal with pulmonary vascularity normal. No adenopathy. Port-A-Cath tip is in the superior vena cava. No pneumothorax. There is aortic atherosclerosis. 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 IMPRESSION: Lungs clear. Heart upper normal in size. Port-A-Cath tip in superior vena cava. Aortic Atherosclerosis (ICD10-I70.0). Electronically Signed   By: Lowella Grip III M.D.   On: 05/02/2020 10:45    EKG: Independently reviewed.  Sinus tachycardia   Assessment/Plan Principal Problem:   Acute blood loss anemia Active Problems:   Hypothyroidism   Diabetes mellitus (HCC)   Essential hypertension   Aortic stenosis    Acute blood loss anemia Patient presents for evaluation of passage of melena stools for about a month He has had a drop in his H&H in the last 1 month and on admission hemoglobin is 6.7g/dl from 9.2g/dl Stools are heme positive We will transfuse 1 unit of packed RBC Monitor serial H&H We will request GI consult Place patient on IV Protonix   Hypertension Hold all antihypertensive medications for now   Hypothyroidism Stable Hold Synthroid for now   Diabetes mellitus Hold all oral hypoglycemic agents for now Gentle IV fluid hydration Check blood sugars q 4 hours   Fever Unclear etiology Patient had a T max of 101.5 Chest x ray does not show any acute findings Follow up results of UA   History of Aortic stenosis Monitor patient closely for signs and symptoms of fluid overload with blood transfusion   History of prostate Cancer Follow up with oncology  as an outpatient   DVT prophylaxis: SCD Code Status: DO NOT RESUSCITATE Family Communication: Greater than 50% of time was spent discussing plan of care with patient at the bedside.  All questions and concerns have been addressed.  CODE STATUS was discussed and he wants to be placed in a DO NOT RESUSCITATE status. He names his brother as his healthcare power of attorney Disposition Plan: Back to previous home environment Consults called: Gastroenterology    Matthew Bullock MD [facility]     05/02/2020, 12:19 PM

## 2020-05-02 NOTE — Interval H&P Note (Signed)
History and Physical Interval Note:  05/02/2020 2:14 PM  Matthew Serrano.  has presented today for surgery, with the diagnosis of Anemia and GI bleed.  The various methods of treatment have been discussed with the patient and family. After consideration of risks, benefits and other options for treatment, the patient has consented to  Procedure(s): ESOPHAGOGASTRODUODENOSCOPY (EGD) WITH PROPOFOL (N/A) as a surgical intervention.  The patient's history has been reviewed, patient examined, no change in status, stable for surgery.  I have reviewed the patient's chart and labs.  Questions were answered to the patient's satisfaction.     [employer]

## 2020-05-02 NOTE — Progress Notes (Signed)
Pt.procedure cancelled see Dr. Hilarie Fredrickson TE. Pt. I.V. # 22 [place] and flushed without difficulty.  Pt.  V/S  97.3 136-20 164 78 ,Personal belongings given back to pt.tranfer form completed and sent with pt. Alert oriented,stable upon discharge ambulated out of unit without assistance,brother transported pt. To hospital via car.

## 2020-05-02 NOTE — Telephone Encounter (Signed)
FYI be on lookout for this patient. Thanks. GM

## 2020-05-02 NOTE — Interval H&P Note (Signed)
History and Physical Interval Note:  05/02/2020 2:13 PM  Matthew Serrano.  has presented today for surgery, with the diagnosis of Anemia and GI bleed.  The various methods of treatment have been discussed with the patient and family. After consideration of risks, benefits and other options for treatment, the patient has consented to  Procedure(s): ESOPHAGOGASTRODUODENOSCOPY (EGD) WITH PROPOFOL (N/A) as a surgical intervention.  The patient's history has been reviewed, patient examined, no change in status, stable for surgery.  I have reviewed the patient's chart and labs.  Questions were answered to the patient's satisfaction.     [employer]

## 2020-05-02 NOTE — H&P (View-Only) (Signed)
Referring Provider:  Alecia Lemming, PA-C Primary Care Physician:  [name], NP Primary Gastroenterologist:  Dr. Fuller Plan  Reason for Consultation:  Anemia and dark stool  HPI: Matthew Serrano. is a 74 y.o. male with pmh significant for but not necessarily limited to hypothyroidism, diabetes, hyperlipidemia, hypertension, history of alcoholism, aortic stenosis, prostate cancer, anemia of chronic disease, adenomatous colon polyps.  Matthew Serrano was referred to our office recently by his PCP for evaluation of anemia and Hemoccult positive stool.  Was seen by Tye Savoy, NP, on 04/24/2020 at which time he was scheduled for an EGD for today and was started on BID ferrous sulfate.  He showed up for EGD today and it was noted that his Hgb was checked again two days ago and was down to 6.3 grams so he was sent to the ED for transfusion and inpatient evaluation.  He has chronic normocytic anemia with baseline hemoglobin in the low 9 range.  Over the last few months he has intermittent dark pudding-like stools.  He takes a baby aspirin but is also on prednisone.  He takes no NSAIDs on a regular basis.  No other blood thinning agents.  He was not on a PPI or any acid blockers, but Matthew Serrano also started him on omeprazole daily empirically at his visit on 9/2.  Patient denies abdominal pain.  Today he admits that he feels weaker and more SOB than normal.    Hgb here today is 6.7 grams.  He is going to receive a unit of PRBCs.  Was noted to have a fever upon arrival at 101.6.  Chest x-ray good and Covid negative.  UA has been ordered.  PREVIOUS ENDOSCOPIC EVALUATIONS / GI studies: Jan 2019 surveillance colonoscopy  -One 6 mm polyp in the cecum, removed with a hot biopsy forceps. Resected and retrieved. - Multiple non-bleeding colonic angiodysplastic lesions and one bleeding angiodysplasias. Clip (MR conditional) was placed. Hemostasis acheived. - Two 7 to 8 mm polyps in the transverse colon, removed with a  cold snare. Resected and retrieved. - Three 4 to 5 mm polyps in the cecum and at the ileocecal valve, removed with a cold biopsy forceps. Resected and retrieved. - Internal hemorrhoids. - The examination was otherwise normal on direct and retroflexion views. - Moderate diverticulosis in the entire examined colon. There was no evidence of diverticular bleeding.  Path = tubular adenomas   Past Medical History:  Diagnosis Date  . Aortic stenosis    Echo 8/18: mild LVH, EF 60-65, no RWMA, mild AS (mean 10, peak 19), MAC, trivial MR, normal RVSF  // Echo 01/2019: EF 60-65, Gr 1 DD, mod AS (mean 22, peak 39)   . Cancer [facility])    prostate cancer  . DIABETES MELLITUS, TYPE II 07/06/2007  . ED (erectile dysfunction)   . Elevated PSA 01/20/2012  . Elevated PSA   . Heart murmur   . History of nuclear stress test    Myoview 8/18: EF 64, inferobasal thinning, no ischemia, low risk  . HYPERLIPIDEMIA 07/06/2007  . HYPERTENSION 07/06/2007  . HYPOTHYROIDISM 07/06/2007  . Leg pain    ABIs 8/18:  Normal   . Obesity     Past Surgical History:  Procedure Laterality Date  . COLONOSCOPY    . COLONOSCOPY W/ BIOPSIES AND POLYPECTOMY    . IR IMAGING GUIDED PORT INSERTION  07/05/2018  . POLYPECTOMY    . RIGHT/LEFT HEART CATH AND CORONARY ANGIOGRAPHY N/A 02/13/2020   Procedure: RIGHT/LEFT HEART CATH  AND CORONARY ANGIOGRAPHY;  Surgeon: Belva Crome, MD;  Location: [place] [facility];  Service: Cardiovascular;  Laterality: N/A;    Prior to Admission medications   Medication Sig Start Date End Date Taking? Authorizing Provider  abiraterone acetate (ZYTIGA) 250 MG tablet TAKE 4 TABLETS (1,000 MG TOTAL) BY MOUTH DAILY. TAKE ON AN EMPTY STOMACH 1 HOUR BEFORE OR 2 HOURS AFTER A MEAL 04/16/20   Wyatt Portela, MD  aspirin EC 81 MG tablet Take 81 mg by mouth daily.    [provider]  atorvastatin (LIPITOR) 40 MG tablet TAKE 1 TABLET EVERY DAY Patient taking differently: Take 40 mg by mouth daily.  TAKE 1 TABLET EVERY DAY 11/07/19   [name], [name], NP  cholecalciferol (VITAMIN D3) 25 MCG (1000 UT) tablet Take 1,000 Units by mouth daily.    [provider]  cloNIDine (CATAPRES) 0.2 MG tablet Take 1 tablet (0.2 mg total) by mouth 2 (two) times daily. 04/17/20 04/12/21  Eileen Stanford, PA-C  doxazosin (CARDURA) 8 MG tablet TAKE 1 TABLET AT BEDTIME Patient taking differently: Take 8 mg by mouth at bedtime. TAKE 1 TABLET AT BEDTIME 08/01/19   [name], [name], NP  ferrous sulfate 325 (65 FE) MG EC tablet Take 1 tablet (325 mg total) by mouth 2 (two) times daily before a meal. 04/24/20   Willia Craze, NP  glipiZIDE (GLUCOTROL XL) 10 MG 24 hr tablet TAKE 1 TABLET TWICE DAILY Patient taking differently: Take 10 mg by mouth in the morning and at bedtime. TAKE 1 TABLET TWICE DAILY 01/16/20   [name], [name], NP  labetalol (NORMODYNE) 200 MG tablet TAKE 3 TABLETS EVERY MORNING  AND TAKE 2 TABLETS EVERY EVENING Patient taking differently: Take 400-600 mg by mouth See admin instructions. TAKE 3 TABLETS EVERY MORNING  AND TAKE 2 TABLETS EVERY EVENING 11/07/19   [name], [name], NP  levothyroxine (SYNTHROID) 150 MCG tablet TAKE 1 TABLET EVERY DAY Patient taking differently: Take 150 mcg by mouth daily before breakfast. TAKE 1 TABLET EVERY DAY 08/01/19   [name], [name], NP  lidocaine-prilocaine (EMLA) cream Apply 1 application topically as needed. Patient taking differently: Apply 1 application topically as needed (port).  06/23/18   Wyatt Portela, MD  metFORMIN (GLUCOPHAGE) 1000 MG tablet TAKE 1 TABLET TWICE DAILY WITH MEALS 04/02/20   [name], [name], NP  NIFEdipine (PROCARDIA XL/NIFEDICAL XL) 60 MG 24 hr tablet TAKE 1 TABLET EVERY DAY Patient taking differently: Take 60 mg by mouth daily. TAKE 1 TABLET EVERY DAY 11/07/19   [name], [name], NP  omeprazole (PRILOSEC) 40 MG capsule TAKE 1 CAPSULE BY MOUTH EVERY DAY 04/30/20   Willia Craze, NP  pioglitazone (ACTOS) 15 MG tablet Take 1 tablet (15 mg  total) by mouth daily. 10/23/19   [name], [name], NP  potassium chloride SA (KLOR-CON) 20 MEQ tablet TAKE 1 TABLET TWICE DAILY Patient taking differently: Take 20 mEq by mouth 2 (two) times daily. TAKE 1 TABLET TWICE DAILY 08/15/19   [name], [name], NP  predniSONE (DELTASONE) 5 MG tablet TAKE 1 TABLET (5 MG TOTAL) BY MOUTH DAILY WITH BREAKFAST. 10/17/19   Wyatt Portela, MD    Current Facility-Administered Medications  Medication Dose Route Frequency Provider Last Rate Last Admin  . 0.9 %  sodium chloride infusion  500 mL Intravenous Continuous Pyrtle, Lajuan Lines, MD      . 0.9 %  sodium chloride infusion  10 mL/hr Intravenous Once [name], PA-C      . pantoprazole (PROTONIX) 80  mg in sodium chloride 0.9 % 100 mL IVPB  80 mg Intravenous Once [name], PA-C       Current Outpatient Medications  Medication Sig Dispense Refill  . abiraterone acetate (ZYTIGA) 250 MG tablet TAKE 4 TABLETS (1,000 MG TOTAL) BY MOUTH DAILY. TAKE ON AN EMPTY STOMACH 1 HOUR BEFORE OR 2 HOURS AFTER A MEAL 120 tablet 0  . aspirin EC 81 MG tablet Take 81 mg by mouth daily.    Marland Kitchen atorvastatin (LIPITOR) 40 MG tablet TAKE 1 TABLET EVERY DAY (Patient taking differently: Take 40 mg by mouth daily. TAKE 1 TABLET EVERY DAY) 90 tablet 2  . cholecalciferol (VITAMIN D3) 25 MCG (1000 UT) tablet Take 1,000 Units by mouth daily.    . cloNIDine (CATAPRES) 0.2 MG tablet Take 1 tablet (0.2 mg total) by mouth 2 (two) times daily. 180 tablet 3  . doxazosin (CARDURA) 8 MG tablet TAKE 1 TABLET AT BEDTIME (Patient taking differently: Take 8 mg by mouth at bedtime. TAKE 1 TABLET AT BEDTIME) 90 tablet 3  . ferrous sulfate 325 (65 FE) MG EC tablet Take 1 tablet (325 mg total) by mouth 2 (two) times daily before a meal. 60 tablet 2  . glipiZIDE (GLUCOTROL XL) 10 MG 24 hr tablet TAKE 1 TABLET TWICE DAILY (Patient taking differently: Take 10 mg by mouth in the morning and at bedtime. TAKE 1 TABLET TWICE DAILY) 180 tablet 0  . labetalol  (NORMODYNE) 200 MG tablet TAKE 3 TABLETS EVERY MORNING  AND TAKE 2 TABLETS EVERY EVENING (Patient taking differently: Take 400-600 mg by mouth See admin instructions. TAKE 3 TABLETS EVERY MORNING  AND TAKE 2 TABLETS EVERY EVENING) 450 tablet 2  . levothyroxine (SYNTHROID) 150 MCG tablet TAKE 1 TABLET EVERY DAY (Patient taking differently: Take 150 mcg by mouth daily before breakfast. TAKE 1 TABLET EVERY DAY) 90 tablet 3  . lidocaine-prilocaine (EMLA) cream Apply 1 application topically as needed. (Patient taking differently: Apply 1 application topically as needed (port). ) 30 g 0  . metFORMIN (GLUCOPHAGE) 1000 MG tablet TAKE 1 TABLET TWICE DAILY WITH MEALS (Patient taking differently: Take 1,000 mg by mouth 2 (two) times daily with a meal. ) 180 tablet 1  . NIFEdipine (PROCARDIA XL/NIFEDICAL XL) 60 MG 24 hr tablet TAKE 1 TABLET EVERY DAY (Patient taking differently: Take 60 mg by mouth daily. TAKE 1 TABLET EVERY DAY) 90 tablet 2  . omeprazole (PRILOSEC) 40 MG capsule TAKE 1 CAPSULE BY MOUTH EVERY DAY (Patient taking differently: Take 40 mg by mouth daily. ) 90 capsule 1  . pioglitazone (ACTOS) 15 MG tablet Take 1 tablet (15 mg total) by mouth daily. 90 tablet 1  . potassium chloride SA (KLOR-CON) 20 MEQ tablet TAKE 1 TABLET TWICE DAILY (Patient taking differently: Take 20 mEq by mouth 2 (two) times daily. TAKE 1 TABLET TWICE DAILY) 180 tablet 3  . predniSONE (DELTASONE) 5 MG tablet TAKE 1 TABLET (5 MG TOTAL) BY MOUTH DAILY WITH BREAKFAST. 90 tablet 3    Allergies as of 05/02/2020  . (No Known Allergies)    Family History  Problem Relation Age of Onset  . Hypertension Other   . Breast cancer Sister   . Breast cancer Other        family history  . Breast cancer Brother        prostate ca  . Aneurysm Mother        Brain aneurysm   . Prostate cancer Father   . Colon cancer Neg  Hx   . Esophageal cancer Neg Hx   . Stomach cancer Neg Hx   . Rectal cancer Neg Hx     Social History    Socioeconomic History  . Marital status: Widowed    Spouse name: Not on file  . Number of children: Not on file  . Years of education: Not on file  . Highest education level: Not on file  Occupational History  . Not on file  Tobacco Use  . Smoking status: Former Research scientist (life sciences)  . Smokeless tobacco: Never Used  . Tobacco comment: quit 2005  Vaping Use  . Vaping Use: Never used  Substance and Sexual Activity  . Alcohol use: Not Currently    Alcohol/week: 21.0 standard drinks    Types: 21 Cans of beer per week  . Drug use: No  . Sexual activity: Not on file  Other Topics Concern  . Not on file  Social History Narrative   He works as a Freight forwarder    Not married    No kids          Social Determinants of Radio broadcast assistant Strain:   . Difficulty of Paying Living Expenses: Not on file  Food Insecurity:   . Worried About Charity fundraiser in the Last Year: Not on file  . Ran Out of Food in the Last Year: Not on file  Transportation Needs:   . Lack of Transportation (Medical): Not on file  . Lack of Transportation (Non-Medical): Not on file  Physical Activity:   . Days of Exercise per Week: Not on file  . Minutes of Exercise per Session: Not on file  Stress:   . Feeling of Stress : Not on file  Social Connections:   . Frequency of Communication with Friends and Family: Not on file  . Frequency of Social Gatherings with Friends and Family: Not on file  . Attends Religious Services: Not on file  . Active Member of Clubs or Organizations: Not on file  . Attends Archivist Meetings: Not on file  . Marital Status: Not on file  Intimate Partner Violence:   . Fear of Current or Ex-Partner: Not on file  . Emotionally Abused: Not on file  . Physically Abused: Not on file  . Sexually Abused: Not on file    Review of Systems: ROS is O/W negative except as mentioned in HPI.  Physical Exam: Vital signs in last 24 hours: Temp:  [99.8 F (37.7 C)-101.6  F (38.7 C)] 99.8 F (37.7 C) (09/10 1132) Pulse Rate:  [124] 124 (09/10 1005) Resp:  [20] 20 (09/10 1005) BP: (130)/(72) 130/72 (09/10 1005) SpO2:  [99 %] 99 % (09/10 1005) Weight:  [95.3 kg] 95.3 kg (09/10 1006)   General:  Alert, Well-developed, well-nourished, pleasant and cooperative in NAD Head:  Normocephalic and atraumatic. Eyes:  Sclera clear, no icterus.  Conjunctiva pale. Ears:  Normal auditory acuity. Mouth:  No deformity or lesions.   Lungs:  Clear throughout to auscultation.  No wheezes, crackles, or rhonchi.  Heart:  Tachy.  SEM noted. Abdomen:  Soft, non-distended.  BS present.  Non-tender.   Msk:  Symmetrical without gross deformities. Pulses:  Normal pulses noted. Extremities:  B/L edema noted L>R Neurologic:  Alert and oriented x 4;  grossly normal neurologically. Skin:  Intact without significant lesions or rashes. Psych:  Alert and cooperative. Normal mood and affect.  Lab Results: Recent Labs    05/02/20 1019  WBC 10.9*  HGB 6.7*  HCT 21.0*  PLT 317   BMET Recent Labs    05/02/20 1019  NA 140  K 3.3*  CL 109  CO2 18*  GLUCOSE 151*  BUN 5*  CREATININE 0.86  CALCIUM 8.2*   LFT Recent Labs    05/02/20 1019  PROT 5.9*  ALBUMIN 2.6*  AST 23  ALT 18  ALKPHOS 64  BILITOT 0.8   Studies/Results: DG Chest Port 1 View  Result Date: 05/02/2020 CLINICAL DATA:  Fever EXAM: PORTABLE CHEST 1 VIEW COMPARISON:  CT angiogram chest February 20, 2020. FINDINGS: The lungs are clear. Heart size is upper normal with pulmonary vascularity normal. No adenopathy. Port-A-Cath tip is in the superior vena cava. No pneumothorax. There is aortic atherosclerosis. 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 IMPRESSION: Lungs clear. Heart upper normal in size. Port-A-Cath tip in superior vena cava. Aortic Atherosclerosis (ICD10-I70.0). Electronically Signed   By: Lowella Grip III M.D.   On: 05/02/2020 10:45   IMPRESSION:  # Acute on chronic [place] anemia  (component of AOCD by iron studies on 9/2) / Heme positive stool.  --Intermittent dark stools over last few months in setting of ASA and prednisone. Rule out PUD. AVMs another possibility given aortic stenosis.  --Hgb drifted from baseline of 9.5 to 7.3 over last two month and then down to 6.3 grams two days ago (6.7 grams here).  Just started on BID oral iron  # Severe aortic stenosis   # Hx of adenomatous colon polyps (multiple) in 2019 --3 year surveillance colonoscopy due 2022  # Hx of multiple non-bleeding colonic angiodysplastic lesions and one bleeding angiodysplasias. Clip (MR conditional) was placed. Hemostasis acheived.  On colonoscopy 08/2017.   PLAN: *Transfuse one unit PRBCs to start.  May need a second unit.  Monitor CBC. *Tentatively planning for EGD later this afternoon after transfusion. *He is on PPI gtt, which is fine for now but he is not having an acute UGIB (more subacute I think).  Can be changed after EGD later today.   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   Matthew Serrano  05/02/2020, 12:19 PM   Attending physician's note   I have taken a history, examined the patient and reviewed the chart. I agree with the Advanced Practitioner's note, impression and recommendations.  74 year old male with history of chronic anemia, fecal Hemoccult positive and intermittent melena  Hgb 6.7  Patient was scheduled for EGD at [facility] this morning was cancelled as he is more appropriate for hospital based endoscopy unit given his co-morbidities including  aortic stenosis, also need pRBC transfusion prior to the procedure  Plan for EGD this after afternoon by Dr. Rush Landmark after PRBC transfusion NPO PPI  If negative, will need colonoscopy +/- VCE for further evaluation.  He has known colonic AVMs based on last colonoscopy  The risks and benefits as well as alternatives of endoscopic  procedure(s) have been discussed and reviewed. All questions answered. The patient agrees to proceed.   The patient was provided an opportunity to ask questions and all were answered. The patient agreed with the plan and demonstrated an understanding of the instructions.  Damaris Hippo , MD [phone]

## 2020-05-02 NOTE — Telephone Encounter (Signed)
Thanks for update. We will see when he is there. Will evaluate and decide on potential role of endoscopic therapies. Dr. Collene Mares will be covering this weekend.

## 2020-05-02 NOTE — Anesthesia Postprocedure Evaluation (Signed)
Anesthesia Post Note  Patient: Matthew Serrano.  Procedure(s) Performed: ENTEROSCOPY (N/A )     Patient location during evaluation: PACU Anesthesia Type: MAC Level of consciousness: awake and alert Pain management: pain level controlled Vital Signs Assessment: post-procedure vital signs reviewed and stable Respiratory status: spontaneous breathing and respiratory function stable Cardiovascular status: stable Postop Assessment: no apparent nausea or vomiting Anesthetic complications: no   No complications documented.  Last Vitals:  Vitals:   05/02/20 1510 05/02/20 1520  BP: (!) 144/82 (!) 141/75  Pulse: (!) 109 (!) 104  Resp: (!) 21 (!) 26  Temp:  37 C  SpO2: 97% 99%    Last Pain:  Vitals:   05/02/20 1454  TempSrc:   PainSc: 0-No pain                 Daphna Lafuente DANIEL

## 2020-05-02 NOTE — Anesthesia Preprocedure Evaluation (Addendum)
Anesthesia Evaluation  Patient identified by MRN, date of birth, ID band  Reviewed: Allergy & Precautions, NPO status , Patient's Chart, lab work & pertinent test results  Airway Mallampati: II  TM Distance: >3 FB Neck ROM: Full    Dental  (+) Poor Dentition, Missing,    Pulmonary former smoker,    breath sounds clear to auscultation       Cardiovascular hypertension, + Valvular Problems/Murmurs (mild AS) AS  Rhythm:Regular Rate:Normal + Systolic murmurs    Neuro/Psych    GI/Hepatic GI bleed   Endo/Other  diabetes, Type 2, Oral Hypoglycemic AgentsHypothyroidism obesity  Renal/GU      Musculoskeletal   Abdominal (+) + obese,   Peds  Hematology  (+) anemia , Hgb 6.7, plan to transfuse   Anesthesia Other Findings   Reproductive/Obstetrics                            Anesthesia Physical Anesthesia Plan  ASA: III and emergent  Anesthesia Plan: MAC   Post-op Pain Management:    Induction: Intravenous  PONV Risk Score and Plan: 1 and Propofol infusion and TIVA  Airway Management Planned: Nasal Cannula  Additional Equipment:   Intra-op Plan:   Post-operative Plan:   Informed Consent: I have reviewed the patients History and Physical, chart, labs and discussed the procedure including the risks, benefits and alternatives for the proposed anesthesia with the patient or authorized representative who has indicated his/her understanding and acceptance.   Patient has DNR.  Discussed DNR with patient and Suspend DNR.     Plan Discussed with:   Anesthesia Plan Comments: (Plan for blood transfusion and IVGA natural airway (propofol gtt). Patient would like DNR suspended perioperatively. He would like all manner of resuscitation if he were to have a perioperative cardiopulmonary event. His upper teeth are VERY loose. Patient states he was to have them removed today at the dentist. I discussed he  is at high risk to swallow or even aspirate his teeth if they were to become dislodged by the bite block or endoscope.  He expressed understanding and still wishes to proceed. Norton Blizzard, MD  )      Anesthesia Quick Evaluation

## 2020-05-02 NOTE — Consult Note (Addendum)
Referring Provider:  Alecia Lemming, PA-C Primary Care Physician:  [name], NP Primary Gastroenterologist:  Dr. Fuller Plan  Reason for Consultation:  Anemia and dark stool  HPI: Matthew Serrano. is a 74 y.o. male with pmh significant for but not necessarily limited to hypothyroidism, diabetes, hyperlipidemia, hypertension, history of alcoholism, aortic stenosis, prostate cancer, anemia of chronic disease, adenomatous colon polyps.  Mr. Reiswig was referred to our office recently by his PCP for evaluation of anemia and Hemoccult positive stool.  Was seen by Tye Savoy, NP, on 04/24/2020 at which time he was scheduled for an EGD for today and was started on BID ferrous sulfate.  He showed up for EGD today and it was noted that his Hgb was checked again two days ago and was down to 6.3 grams so he was sent to the ED for transfusion and inpatient evaluation.  He has chronic normocytic anemia with baseline hemoglobin in the low 9 range.  Over the last few months he has intermittent dark pudding-like stools.  He takes a baby aspirin but is also on prednisone.  He takes no NSAIDs on a regular basis.  No other blood thinning agents.  He was not on a PPI or any acid blockers, but Nevin Bloodgood also started him on omeprazole daily empirically at his visit on 9/2.  Patient denies abdominal pain.  Today he admits that he feels weaker and more SOB than normal.    Hgb here today is 6.7 grams.  He is going to receive a unit of PRBCs.  Was noted to have a fever upon arrival at 101.6.  Chest x-ray good and Covid negative.  UA has been ordered.  PREVIOUS ENDOSCOPIC EVALUATIONS / GI studies: Jan 2019 surveillance colonoscopy  -One 6 mm polyp in the cecum, removed with a hot biopsy forceps. Resected and retrieved. - Multiple non-bleeding colonic angiodysplastic lesions and one bleeding angiodysplasias. Clip (MR conditional) was placed. Hemostasis acheived. - Two 7 to 8 mm polyps in the transverse colon, removed with a  cold snare. Resected and retrieved. - Three 4 to 5 mm polyps in the cecum and at the ileocecal valve, removed with a cold biopsy forceps. Resected and retrieved. - Internal hemorrhoids. - The examination was otherwise normal on direct and retroflexion views. - Moderate diverticulosis in the entire examined colon. There was no evidence of diverticular bleeding.  Path = tubular adenomas   Past Medical History:  Diagnosis Date  . Aortic stenosis    Echo 8/18: mild LVH, EF 60-65, no RWMA, mild AS (mean 10, peak 19), MAC, trivial MR, normal RVSF  // Echo 01/2019: EF 60-65, Gr 1 DD, mod AS (mean 22, peak 39)   . Cancer [facility])    prostate cancer  . DIABETES MELLITUS, TYPE II 07/06/2007  . ED (erectile dysfunction)   . Elevated PSA 01/20/2012  . Elevated PSA   . Heart murmur   . History of nuclear stress test    Myoview 8/18: EF 64, inferobasal thinning, no ischemia, low risk  . HYPERLIPIDEMIA 07/06/2007  . HYPERTENSION 07/06/2007  . HYPOTHYROIDISM 07/06/2007  . Leg pain    ABIs 8/18:  Normal   . Obesity     Past Surgical History:  Procedure Laterality Date  . COLONOSCOPY    . COLONOSCOPY W/ BIOPSIES AND POLYPECTOMY    . IR IMAGING GUIDED PORT INSERTION  07/05/2018  . POLYPECTOMY    . RIGHT/LEFT HEART CATH AND CORONARY ANGIOGRAPHY N/A 02/13/2020   Procedure: RIGHT/LEFT HEART CATH  AND CORONARY ANGIOGRAPHY;  Surgeon: Belva Crome, MD;  Location: [place] [facility];  Service: Cardiovascular;  Laterality: N/A;    Prior to Admission medications   Medication Sig Start Date End Date Taking? Authorizing Provider  abiraterone acetate (ZYTIGA) 250 MG tablet TAKE 4 TABLETS (1,000 MG TOTAL) BY MOUTH DAILY. TAKE ON AN EMPTY STOMACH 1 HOUR BEFORE OR 2 HOURS AFTER A MEAL 04/16/20   Wyatt Portela, MD  aspirin EC 81 MG tablet Take 81 mg by mouth daily.    [provider]  atorvastatin (LIPITOR) 40 MG tablet TAKE 1 TABLET EVERY DAY Patient taking differently: Take 40 mg by mouth daily.  TAKE 1 TABLET EVERY DAY 11/07/19   [name], [name], NP  cholecalciferol (VITAMIN D3) 25 MCG (1000 UT) tablet Take 1,000 Units by mouth daily.    [provider]  cloNIDine (CATAPRES) 0.2 MG tablet Take 1 tablet (0.2 mg total) by mouth 2 (two) times daily. 04/17/20 04/12/21  Eileen Stanford, PA-C  doxazosin (CARDURA) 8 MG tablet TAKE 1 TABLET AT BEDTIME Patient taking differently: Take 8 mg by mouth at bedtime. TAKE 1 TABLET AT BEDTIME 08/01/19   [name], [name], NP  ferrous sulfate 325 (65 FE) MG EC tablet Take 1 tablet (325 mg total) by mouth 2 (two) times daily before a meal. 04/24/20   Willia Craze, NP  glipiZIDE (GLUCOTROL XL) 10 MG 24 hr tablet TAKE 1 TABLET TWICE DAILY Patient taking differently: Take 10 mg by mouth in the morning and at bedtime. TAKE 1 TABLET TWICE DAILY 01/16/20   [name], [name], NP  labetalol (NORMODYNE) 200 MG tablet TAKE 3 TABLETS EVERY MORNING  AND TAKE 2 TABLETS EVERY EVENING Patient taking differently: Take 400-600 mg by mouth See admin instructions. TAKE 3 TABLETS EVERY MORNING  AND TAKE 2 TABLETS EVERY EVENING 11/07/19   [name], [name], NP  levothyroxine (SYNTHROID) 150 MCG tablet TAKE 1 TABLET EVERY DAY Patient taking differently: Take 150 mcg by mouth daily before breakfast. TAKE 1 TABLET EVERY DAY 08/01/19   [name], [name], NP  lidocaine-prilocaine (EMLA) cream Apply 1 application topically as needed. Patient taking differently: Apply 1 application topically as needed (port).  06/23/18   Wyatt Portela, MD  metFORMIN (GLUCOPHAGE) 1000 MG tablet TAKE 1 TABLET TWICE DAILY WITH MEALS 04/02/20   [name], [name], NP  NIFEdipine (PROCARDIA XL/NIFEDICAL XL) 60 MG 24 hr tablet TAKE 1 TABLET EVERY DAY Patient taking differently: Take 60 mg by mouth daily. TAKE 1 TABLET EVERY DAY 11/07/19   [name], [name], NP  omeprazole (PRILOSEC) 40 MG capsule TAKE 1 CAPSULE BY MOUTH EVERY DAY 04/30/20   Willia Craze, NP  pioglitazone (ACTOS) 15 MG tablet Take 1 tablet (15 mg  total) by mouth daily. 10/23/19   [name], [name], NP  potassium chloride SA (KLOR-CON) 20 MEQ tablet TAKE 1 TABLET TWICE DAILY Patient taking differently: Take 20 mEq by mouth 2 (two) times daily. TAKE 1 TABLET TWICE DAILY 08/15/19   [name], [name], NP  predniSONE (DELTASONE) 5 MG tablet TAKE 1 TABLET (5 MG TOTAL) BY MOUTH DAILY WITH BREAKFAST. 10/17/19   Wyatt Portela, MD    Current Facility-Administered Medications  Medication Dose Route Frequency Provider Last Rate Last Admin  . 0.9 %  sodium chloride infusion  500 mL Intravenous Continuous Pyrtle, Lajuan Lines, MD      . 0.9 %  sodium chloride infusion  10 mL/hr Intravenous Once [name], PA-C      . pantoprazole (PROTONIX) 80  mg in sodium chloride 0.9 % 100 mL IVPB  80 mg Intravenous Once [name], PA-C       Current Outpatient Medications  Medication Sig Dispense Refill  . abiraterone acetate (ZYTIGA) 250 MG tablet TAKE 4 TABLETS (1,000 MG TOTAL) BY MOUTH DAILY. TAKE ON AN EMPTY STOMACH 1 HOUR BEFORE OR 2 HOURS AFTER A MEAL 120 tablet 0  . aspirin EC 81 MG tablet Take 81 mg by mouth daily.    Marland Kitchen atorvastatin (LIPITOR) 40 MG tablet TAKE 1 TABLET EVERY DAY (Patient taking differently: Take 40 mg by mouth daily. TAKE 1 TABLET EVERY DAY) 90 tablet 2  . cholecalciferol (VITAMIN D3) 25 MCG (1000 UT) tablet Take 1,000 Units by mouth daily.    . cloNIDine (CATAPRES) 0.2 MG tablet Take 1 tablet (0.2 mg total) by mouth 2 (two) times daily. 180 tablet 3  . doxazosin (CARDURA) 8 MG tablet TAKE 1 TABLET AT BEDTIME (Patient taking differently: Take 8 mg by mouth at bedtime. TAKE 1 TABLET AT BEDTIME) 90 tablet 3  . ferrous sulfate 325 (65 FE) MG EC tablet Take 1 tablet (325 mg total) by mouth 2 (two) times daily before a meal. 60 tablet 2  . glipiZIDE (GLUCOTROL XL) 10 MG 24 hr tablet TAKE 1 TABLET TWICE DAILY (Patient taking differently: Take 10 mg by mouth in the morning and at bedtime. TAKE 1 TABLET TWICE DAILY) 180 tablet 0  . labetalol  (NORMODYNE) 200 MG tablet TAKE 3 TABLETS EVERY MORNING  AND TAKE 2 TABLETS EVERY EVENING (Patient taking differently: Take 400-600 mg by mouth See admin instructions. TAKE 3 TABLETS EVERY MORNING  AND TAKE 2 TABLETS EVERY EVENING) 450 tablet 2  . levothyroxine (SYNTHROID) 150 MCG tablet TAKE 1 TABLET EVERY DAY (Patient taking differently: Take 150 mcg by mouth daily before breakfast. TAKE 1 TABLET EVERY DAY) 90 tablet 3  . lidocaine-prilocaine (EMLA) cream Apply 1 application topically as needed. (Patient taking differently: Apply 1 application topically as needed (port). ) 30 g 0  . metFORMIN (GLUCOPHAGE) 1000 MG tablet TAKE 1 TABLET TWICE DAILY WITH MEALS (Patient taking differently: Take 1,000 mg by mouth 2 (two) times daily with a meal. ) 180 tablet 1  . NIFEdipine (PROCARDIA XL/NIFEDICAL XL) 60 MG 24 hr tablet TAKE 1 TABLET EVERY DAY (Patient taking differently: Take 60 mg by mouth daily. TAKE 1 TABLET EVERY DAY) 90 tablet 2  . omeprazole (PRILOSEC) 40 MG capsule TAKE 1 CAPSULE BY MOUTH EVERY DAY (Patient taking differently: Take 40 mg by mouth daily. ) 90 capsule 1  . pioglitazone (ACTOS) 15 MG tablet Take 1 tablet (15 mg total) by mouth daily. 90 tablet 1  . potassium chloride SA (KLOR-CON) 20 MEQ tablet TAKE 1 TABLET TWICE DAILY (Patient taking differently: Take 20 mEq by mouth 2 (two) times daily. TAKE 1 TABLET TWICE DAILY) 180 tablet 3  . predniSONE (DELTASONE) 5 MG tablet TAKE 1 TABLET (5 MG TOTAL) BY MOUTH DAILY WITH BREAKFAST. 90 tablet 3    Allergies as of 05/02/2020  . (No Known Allergies)    Family History  Problem Relation Age of Onset  . Hypertension Other   . Breast cancer Sister   . Breast cancer Other        family history  . Breast cancer Brother        prostate ca  . Aneurysm Mother        Brain aneurysm   . Prostate cancer Father   . Colon cancer Neg  Hx   . Esophageal cancer Neg Hx   . Stomach cancer Neg Hx   . Rectal cancer Neg Hx     Social History    Socioeconomic History  . Marital status: Widowed    Spouse name: Not on file  . Number of children: Not on file  . Years of education: Not on file  . Highest education level: Not on file  Occupational History  . Not on file  Tobacco Use  . Smoking status: Former Research scientist (life sciences)  . Smokeless tobacco: Never Used  . Tobacco comment: quit 2005  Vaping Use  . Vaping Use: Never used  Substance and Sexual Activity  . Alcohol use: Not Currently    Alcohol/week: 21.0 standard drinks    Types: 21 Cans of beer per week  . Drug use: No  . Sexual activity: Not on file  Other Topics Concern  . Not on file  Social History Narrative   He works as a Freight forwarder    Not married    No kids          Social Determinants of Radio broadcast assistant Strain:   . Difficulty of Paying Living Expenses: Not on file  Food Insecurity:   . Worried About Charity fundraiser in the Last Year: Not on file  . Ran Out of Food in the Last Year: Not on file  Transportation Needs:   . Lack of Transportation (Medical): Not on file  . Lack of Transportation (Non-Medical): Not on file  Physical Activity:   . Days of Exercise per Week: Not on file  . Minutes of Exercise per Session: Not on file  Stress:   . Feeling of Stress : Not on file  Social Connections:   . Frequency of Communication with Friends and Family: Not on file  . Frequency of Social Gatherings with Friends and Family: Not on file  . Attends Religious Services: Not on file  . Active Member of Clubs or Organizations: Not on file  . Attends Archivist Meetings: Not on file  . Marital Status: Not on file  Intimate Partner Violence:   . Fear of Current or Ex-Partner: Not on file  . Emotionally Abused: Not on file  . Physically Abused: Not on file  . Sexually Abused: Not on file    Review of Systems: ROS is O/W negative except as mentioned in HPI.  Physical Exam: Vital signs in last 24 hours: Temp:  [99.8 F (37.7 C)-101.6  F (38.7 C)] 99.8 F (37.7 C) (09/10 1132) Pulse Rate:  [124] 124 (09/10 1005) Resp:  [20] 20 (09/10 1005) BP: (130)/(72) 130/72 (09/10 1005) SpO2:  [99 %] 99 % (09/10 1005) Weight:  [95.3 kg] 95.3 kg (09/10 1006)   General:  Alert, Well-developed, well-nourished, pleasant and cooperative in NAD Head:  Normocephalic and atraumatic. Eyes:  Sclera clear, no icterus.  Conjunctiva pale. Ears:  Normal auditory acuity. Mouth:  No deformity or lesions.   Lungs:  Clear throughout to auscultation.  No wheezes, crackles, or rhonchi.  Heart:  Tachy.  SEM noted. Abdomen:  Soft, non-distended.  BS present.  Non-tender.   Msk:  Symmetrical without gross deformities. Pulses:  Normal pulses noted. Extremities:  B/L edema noted L>R Neurologic:  Alert and oriented x 4;  grossly normal neurologically. Skin:  Intact without significant lesions or rashes. Psych:  Alert and cooperative. Normal mood and affect.  Lab Results: Recent Labs    05/02/20 1019  WBC 10.9*  HGB 6.7*  HCT 21.0*  PLT 317   BMET Recent Labs    05/02/20 1019  NA 140  K 3.3*  CL 109  CO2 18*  GLUCOSE 151*  BUN 5*  CREATININE 0.86  CALCIUM 8.2*   LFT Recent Labs    05/02/20 1019  PROT 5.9*  ALBUMIN 2.6*  AST 23  ALT 18  ALKPHOS 64  BILITOT 0.8   Studies/Results: DG Chest Port 1 View  Result Date: 05/02/2020 CLINICAL DATA:  Fever EXAM: PORTABLE CHEST 1 VIEW COMPARISON:  CT angiogram chest February 20, 2020. FINDINGS: The lungs are clear. Heart size is upper normal with pulmonary vascularity normal. No adenopathy. Port-A-Cath tip is in the superior vena cava. No pneumothorax. There is aortic atherosclerosis. 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 IMPRESSION: Lungs clear. Heart upper normal in size. Port-A-Cath tip in superior vena cava. Aortic Atherosclerosis (ICD10-I70.0). Electronically Signed   By: Lowella Grip III M.D.   On: 05/02/2020 10:45   IMPRESSION:  # Acute on chronic [place] anemia  (component of AOCD by iron studies on 9/2) / Heme positive stool.  --Intermittent dark stools over last few months in setting of ASA and prednisone. Rule out PUD. AVMs another possibility given aortic stenosis.  --Hgb drifted from baseline of 9.5 to 7.3 over last two month and then down to 6.3 grams two days ago (6.7 grams here).  Just started on BID oral iron  # Severe aortic stenosis   # Hx of adenomatous colon polyps (multiple) in 2019 --3 year surveillance colonoscopy due 2022  # Hx of multiple non-bleeding colonic angiodysplastic lesions and one bleeding angiodysplasias. Clip (MR conditional) was placed. Hemostasis acheived.  On colonoscopy 08/2017.   PLAN: *Transfuse one unit PRBCs to start.  May need a second unit.  Monitor CBC. *Tentatively planning for EGD later this afternoon after transfusion. *He is on PPI gtt, which is fine for now but he is not having an acute UGIB (more subacute I think).  Can be changed after EGD later today.   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   Laban Emperor. Zehr  05/02/2020, 12:19 PM   Attending physician's note   I have taken a history, examined the patient and reviewed the chart. I agree with the Advanced Practitioner's note, impression and recommendations.  74 year old male with history of chronic anemia, fecal Hemoccult positive and intermittent melena  Hgb 6.7  Patient was scheduled for EGD at [facility] this morning was cancelled as he is more appropriate for hospital based endoscopy unit given his co-morbidities including  aortic stenosis, also need pRBC transfusion prior to the procedure  Plan for EGD this after afternoon by Dr. Rush Landmark after PRBC transfusion NPO PPI  If negative, will need colonoscopy +/- VCE for further evaluation.  He has known colonic AVMs based on last colonoscopy  The risks and benefits as well as alternatives of endoscopic  procedure(s) have been discussed and reviewed. All questions answered. The patient agrees to proceed.   The patient was provided an opportunity to ask questions and all were answered. The patient agreed with the plan and demonstrated an understanding of the instructions.  Damaris Hippo , MD [phone]

## 2020-05-02 NOTE — ED Provider Notes (Signed)
[facility] DEPT Provider Note   CSN: [id number] Arrival date & time: 05/02/20  0946     History No chief complaint on file.   Matthew Davoli. is a 74 y.o. male.  Patient with history of aortic stenosis, diabetes, previous alcohol abuse quit in 2018, history of prostate cancer, anemia of chronic disease --presents the emergency department for low hemoglobin.  Patient has been having dark stool, noted heme positive by his GI doctor.  Most recent hemoglobin checked 3 days ago was 6.3.  Patient went in for upper endoscopy today and was unable to have this performed due to risks associated with severe aortic stenosis.  Patient was sent for admission and likely blood transfusion.  Patient reports generalized fatigue and weakness recently with shortness of breath when he crimes stairs at his house.  Denies chest pain.  No anticoagulation but does take 81 mg of aspirin daily.  Denies other NSAID use.  No current alcohol use or history of peptic ulcer disease.  Patient has been fully vaccinated for coronavirus.  Previous colonoscopy with polyps, AVMs, diverticulosis.   Upon arrival to the emergency department patient was found to have a fever of 101.6 F.  He denies other symptoms.  He was not aware that he had a fever until this time.        Past Medical History:  Diagnosis Date  . Aortic stenosis    Echo 8/18: mild LVH, EF 60-65, no RWMA, mild AS (mean 10, peak 19), MAC, trivial MR, normal RVSF  // Echo 01/2019: EF 60-65, Gr 1 DD, mod AS (mean 22, peak 39)   . Cancer [facility])    prostate cancer  . DIABETES MELLITUS, TYPE II 07/06/2007  . ED (erectile dysfunction)   . Elevated PSA 01/20/2012  . Elevated PSA   . Heart murmur   . History of nuclear stress test    Myoview 8/18: EF 64, inferobasal thinning, no ischemia, low risk  . HYPERLIPIDEMIA 07/06/2007  . HYPERTENSION 07/06/2007  . HYPOTHYROIDISM 07/06/2007  . Leg pain    ABIs 8/18:  Normal   . Obesity       Patient Active Problem List   Diagnosis Date Noted  . Malignant neoplasm of prostate ([place]) 06/23/2018  . Goals of care, counseling/discussion 06/23/2018  . Aortic stenosis   . Left inguinal hernia 09/04/2015  . Alcoholism ([place]) 06/10/2014  . Anemia of chronic disease 05/23/2014  . Elevated PSA 01/20/2012  . HEMORRHAGE OF RECTUM AND ANUS 12/15/2008  . ERECTILE DYSFUNCTION, MILD 12/14/2007  . SEBORRHEIC KERATOSIS, INFLAMED 08/03/2007  . NEOPLASM, SKIN, UNCERTAIN BEHAVIOR 33/82/5053  . Hypothyroidism 07/06/2007  . Diabetes mellitus ([place]) 07/06/2007  . Hyperlipidemia 07/06/2007  . Essential hypertension 07/06/2007    Past Surgical History:  Procedure Laterality Date  . COLONOSCOPY    . COLONOSCOPY W/ BIOPSIES AND POLYPECTOMY    . IR IMAGING GUIDED PORT INSERTION  07/05/2018  . POLYPECTOMY    . RIGHT/LEFT HEART CATH AND CORONARY ANGIOGRAPHY N/A 02/13/2020   Procedure: RIGHT/LEFT HEART CATH AND CORONARY ANGIOGRAPHY;  Surgeon: Belva Crome, MD;  Location: [place] [facility];  Service: Cardiovascular;  Laterality: N/A;       Family History  Problem Relation Age of Onset  . Hypertension Other   . Breast cancer Sister   . Breast cancer Other        family history  . Breast cancer Brother        prostate ca  . Aneurysm  Mother        Brain aneurysm   . Prostate cancer Father   . Colon cancer Neg Hx   . Esophageal cancer Neg Hx   . Stomach cancer Neg Hx   . Rectal cancer Neg Hx     Social History   Tobacco Use  . Smoking status: Former Research scientist (life sciences)  . Smokeless tobacco: Never Used  . Tobacco comment: quit 2005  Vaping Use  . Vaping Use: Never used  Substance Use Topics  . Alcohol use: Not Currently    Alcohol/week: 21.0 standard drinks    Types: 21 Cans of beer per week  . Drug use: No    Home Medications Prior to Admission medications   Medication Sig Start Date End Date Taking? Authorizing Provider  abiraterone acetate (ZYTIGA) 250 MG tablet TAKE 4 TABLETS  (1,000 MG TOTAL) BY MOUTH DAILY. TAKE ON AN EMPTY STOMACH 1 HOUR BEFORE OR 2 HOURS AFTER A MEAL 04/16/20   Wyatt Portela, MD  aspirin EC 81 MG tablet Take 81 mg by mouth daily.    [provider]  atorvastatin (LIPITOR) 40 MG tablet TAKE 1 TABLET EVERY DAY Patient taking differently: Take 40 mg by mouth daily. TAKE 1 TABLET EVERY DAY 11/07/19   [name], [name], NP  cholecalciferol (VITAMIN D3) 25 MCG (1000 UT) tablet Take 1,000 Units by mouth daily.    [provider]  cloNIDine (CATAPRES) 0.2 MG tablet Take 1 tablet (0.2 mg total) by mouth 2 (two) times daily. 04/17/20 04/12/21  Eileen Stanford, PA-C  doxazosin (CARDURA) 8 MG tablet TAKE 1 TABLET AT BEDTIME Patient taking differently: Take 8 mg by mouth at bedtime. TAKE 1 TABLET AT BEDTIME 08/01/19   [name], [name], NP  ferrous sulfate 325 (65 FE) MG EC tablet Take 1 tablet (325 mg total) by mouth 2 (two) times daily before a meal. 04/24/20   Willia Craze, NP  glipiZIDE (GLUCOTROL XL) 10 MG 24 hr tablet TAKE 1 TABLET TWICE DAILY Patient taking differently: Take 10 mg by mouth in the morning and at bedtime. TAKE 1 TABLET TWICE DAILY 01/16/20   [name], [name], NP  labetalol (NORMODYNE) 200 MG tablet TAKE 3 TABLETS EVERY MORNING  AND TAKE 2 TABLETS EVERY EVENING Patient taking differently: Take 400-600 mg by mouth See admin instructions. TAKE 3 TABLETS EVERY MORNING  AND TAKE 2 TABLETS EVERY EVENING 11/07/19   [name], [name], NP  levothyroxine (SYNTHROID) 150 MCG tablet TAKE 1 TABLET EVERY DAY Patient taking differently: Take 150 mcg by mouth daily before breakfast. TAKE 1 TABLET EVERY DAY 08/01/19   [name], [name], NP  lidocaine-prilocaine (EMLA) cream Apply 1 application topically as needed. Patient taking differently: Apply 1 application topically as needed (port).  06/23/18   Wyatt Portela, MD  metFORMIN (GLUCOPHAGE) 1000 MG tablet TAKE 1 TABLET TWICE DAILY WITH MEALS 04/02/20   [name], [name], NP  NIFEdipine (PROCARDIA  XL/NIFEDICAL XL) 60 MG 24 hr tablet TAKE 1 TABLET EVERY DAY Patient taking differently: Take 60 mg by mouth daily. TAKE 1 TABLET EVERY DAY 11/07/19   [name], [name], NP  omeprazole (PRILOSEC) 40 MG capsule TAKE 1 CAPSULE BY MOUTH EVERY DAY 04/30/20   Willia Craze, NP  pioglitazone (ACTOS) 15 MG tablet Take 1 tablet (15 mg total) by mouth daily. 10/23/19   [name], [name], NP  potassium chloride SA (KLOR-CON) 20 MEQ tablet TAKE 1 TABLET TWICE DAILY Patient taking differently: Take 20 mEq by mouth 2 (two) times daily. TAKE 1 TABLET  TWICE DAILY 08/15/19   [name], [name], NP  predniSONE (DELTASONE) 5 MG tablet TAKE 1 TABLET (5 MG TOTAL) BY MOUTH DAILY WITH BREAKFAST. 10/17/19   Wyatt Portela, MD    Allergies    Patient has no known allergies.  Review of Systems   Review of Systems  Constitutional: Positive for fatigue and fever.  HENT: Negative for rhinorrhea and sore throat.   Eyes: Negative for redness.  Respiratory: Positive for shortness of breath. Negative for cough.   Cardiovascular: Negative for chest pain.  Gastrointestinal: Positive for blood in stool. Negative for abdominal pain, diarrhea, nausea and vomiting.  Genitourinary: Negative for dysuria and hematuria.  Musculoskeletal: Negative for myalgias.  Skin: Negative for rash.  Neurological: Positive for weakness (generalized). Negative for headaches.    Physical Exam Updated Vital Signs BP 130/72 (BP Location: Right Arm)   Pulse (!) 124   Temp (!) 101.6 F (38.7 C) (Oral)   Resp 20   Ht _0  (1.702 m)   Wt 95.3 kg   SpO2 99%   BMI 32.89 kg/m   Physical Exam Vitals and nursing note reviewed.  Constitutional:      Appearance: He is well-developed.  HENT:     Head: Normocephalic and atraumatic.  Eyes:     General:        Right eye: No discharge.        Left eye: No discharge.     Comments: Conjunctivae slightly pale.   Cardiovascular:     Rate and Rhythm: Regular rhythm. Tachycardia present.     Heart sounds:  Murmur (3/6, holosystolic, across precordium) heard.   Pulmonary:     Effort: Pulmonary effort is normal.     Breath sounds: Normal breath sounds.  Abdominal:     Palpations: Abdomen is soft.     Tenderness: There is no abdominal tenderness. There is no guarding or rebound.  Musculoskeletal:     Cervical back: Normal range of motion and neck supple.  Skin:    General: Skin is warm and dry.  Neurological:     Mental Status: He is alert.     ED Results / Procedures / Treatments   Labs (all labs ordered are listed, but only abnormal results are displayed) Labs Reviewed  CBC WITH DIFFERENTIAL/PLATELET - Abnormal; Notable for the following components:      Result Value   WBC 10.9 (*)    RBC 2.53 (*)    Hemoglobin 6.7 (*)    HCT 21.0 (*)    Neutro Abs 8.7 (*)    All other components within normal limits  COMPREHENSIVE METABOLIC PANEL - Abnormal; Notable for the following components:   Potassium 3.3 (*)    CO2 18 (*)    Glucose, Bld 151 (*)    BUN 5 (*)    Calcium 8.2 (*)    Total Protein 5.9 (*)    Albumin 2.6 (*)    All other components within normal limits  SARS CORONAVIRUS 2 BY RT PCR (HOSPITAL ORDER, [facility])  URINE CULTURE  PROTIME-INR  URINALYSIS, ROUTINE W REFLEX MICROSCOPIC  TYPE AND SCREEN  PREPARE RBC (CROSSMATCH)  ABO/RH    EKG None  Radiology DG Chest Port 1 View  Result Date: 05/02/2020 CLINICAL DATA:  Fever EXAM: PORTABLE CHEST 1 VIEW COMPARISON:  CT angiogram chest February 20, 2020. FINDINGS: The lungs are clear. Heart size is upper normal with pulmonary vascularity normal. No adenopathy. Port-A-Cath tip is in the superior  vena cava. No pneumothorax. There is aortic atherosclerosis. 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 IMPRESSION: Lungs clear. Heart upper normal in size. Port-A-Cath tip in superior vena cava. Aortic Atherosclerosis (ICD10-I70.0). Electronically Signed   By: Lowella Grip III M.D.   On:  05/02/2020 10:45    Procedures Procedures (including critical care time)  Medications Ordered in ED Medications  pantoprazole (PROTONIX) 80 mg in sodium chloride 0.9 % 100 mL IVPB (has no administration in time range)  0.9 %  sodium chloride infusion (has no administration in time range)  acetaminophen (TYLENOL) tablet 650 mg (650 mg Oral Given 05/02/20 1058)  sodium chloride 0.9 % bolus 500 mL (500 mLs Intravenous Bolus from Bag 05/02/20 1058)    ED Course  I have reviewed the triage vital signs and the nursing notes.  Pertinent labs & imaging results that were available during my care of the patient were reviewed by me and considered in my medical decision making (see chart for details).  Patient seen and examined.  EKG reviewed personally.  GI bleeding orders placed.  Patient found to have fever on arrival.  Added CXR, UA, lactate. Will give tylenol and IV protonix 60m x1.   Vital signs reviewed and are as follows: BP 130/72 (BP Location: Right Arm)   Pulse (!) 124   Temp (!) 101.6 F (38.7 C) (Oral)   Resp 20   Ht _0  (1.702 m)   Wt 95.3 kg   SpO2 99%   BMI 32.89 kg/m   11:25 AM Spoke with JAlonza Bogusof [facility] who is aware of patient in ED. Possible endoscopy today.   11:47 AM Spoke with [facility], Dr. AFrancine Graven regarding admission. Pt rechecked. Doing well. BP normal, HR 105 with temp improved to 37.7 C  CRITICAL CARE Performed by: JCarlisle CaterPA-C Total critical care time: 35 minutes Critical care time was exclusive of separately billable procedures and treating other patients. Critical care was necessary to treat or prevent imminent or life-threatening deterioration. Critical care was time spent personally by me on the following activities: development of treatment plan with patient and/or surrogate as well as nursing, discussions with consultants, evaluation of patient's response to treatment, examination of patient, obtaining history from patient or surrogate,  ordering and performing treatments and interventions, ordering and review of laboratory studies, ordering and review of radiographic studies, pulse oximetry and re-evaluation of patient's condition.     MDM Rules/Calculators/A&P                          Admit.   Final Clinical Impression(s) / ED Diagnoses Final diagnoses:  Symptomatic anemia  Gastrointestinal hemorrhage, unspecified gastrointestinal hemorrhage type  Febrile illness    Rx / DC Orders ED Discharge Orders    None       GCarlisle Cater PA-C 05/02/20 1149    KVirgel Manifold MD 05/03/20 [id number]

## 2020-05-02 NOTE — Transfer of Care (Signed)
Immediate Anesthesia Transfer of Care Note  Patient: Matthew Serrano.  Procedure(s) Performed: ENTEROSCOPY (N/A )  Patient Location: Endoscopy Unit  Anesthesia Type:MAC  Level of Consciousness: awake, alert , oriented and patient cooperative  Airway & Oxygen Therapy: Patient Spontanous Breathing and Patient connected to face mask oxygen  Post-op Assessment: Report given to RN, Post -op Vital signs reviewed and stable and Patient moving all extremities  Post vital signs: Reviewed and stable  Last Vitals:  Vitals Value Taken Time  BP    Temp    Pulse 100 05/02/20 1457  Resp 24 05/02/20 1457  SpO2 100 % 05/02/20 1457  Vitals shown include unvalidated device data.  Last Pain:  Vitals:   05/02/20 1413  TempSrc: Oral  PainSc: 0-No pain         Complications: No complications documented.

## 2020-05-02 NOTE — Op Note (Signed)
[facility] Patient Name: Matthew Serrano Procedure Date: 05/02/2020 MRN: [id number] Attending MD: Justice Britain , MD Date of Birth: 10/10/1945 CSN: [id number] Age: 74 Admit Type: Inpatient Procedure:                Small bowel enteroscopy Indications:              Anemia, Melena, Exclusion of angioectasia Providers:                Justice Britain, MD, Doristine Johns, RN, Paulo Fruit, RN, Cletis Athens, Technician Referring MD:             [facility], Pricilla Riffle. Fuller Plan, MD, Lajuan Lines.                            Pyrtle, MD Medicines:                Monitored Anesthesia Care Complications:            No immediate complications. Estimated Blood Loss:     Estimated blood loss was minimal. Procedure:                Pre-Anesthesia Assessment:                           - Prior to the procedure, a History and Physical                            was performed, and patient medications and                            allergies were reviewed. The patient's tolerance of                            previous anesthesia was also reviewed. The risks                            and benefits of the procedure and the sedation                            options and risks were discussed with the patient.                            All questions were answered, and informed consent                            was obtained. Prior Anticoagulants: The patient has                            taken no previous anticoagulant or antiplatelet                            agents. ASA Grade Assessment: III - A patient with  severe systemic disease. After reviewing the risks                            and benefits, the patient was deemed in                            satisfactory condition to undergo the procedure.                           After obtaining informed consent, the endoscope was                            passed under direct vision.  Throughout the                            procedure, the patient's blood pressure, pulse, and                            oxygen saturations were monitored continuously. The                            GIF-H190 ([id number]) Olympus gastroscope was                            introduced through the mouth, and advanced to the.                            The PCF-H190DL ([id number]) Olympus pediatric                            colonscope was introduced through the mouth and                            advanced to the distal jejunum. The small bowel                            enteroscopy was accomplished without difficulty.                            The patient tolerated the procedure. Scope In: Scope Out: Findings:      No gross lesions were noted in the entire esophagus.      The Z-line was regular and was found 40 cm from the incisors.      A 2 cm hiatal hernia was present.      No gross mucosal lesions were noted in the entire examined stomach.      Normal mucosa was found in the entire duodenum.      Normal mucosa was found in the proximal jejunum. Area was tattooed with       an injection of Spot (carbon black) to demarcate the distal extent of       today's SBE. Impression:               - No gross lesions in esophagus. Z-line regular, 40  cm from the incisors.                           - 2 cm hiatal hernia.                           - No gross lesions in the stomach.                           - Normal mucosa was found in the entire examined                            duodenum.                           - Normal mucosa was found in the proximal examined                            jejunum. Tattooed distal extent.                           - No source for anemia/dark stools on today's                            evaluation. Recommendation:           - The patient will be observed post-procedure,                            until all discharge criteria are met.                            - Return patient to hospital ward for ongoing care.                           - Clear liquid diet.                           - Trend Hgb/Hct [id number].                           - Prepare patient for Colonoscopy on 9/11 for                            further evaluation of Anemia and Dark stools                            (history of Colonic AVMs) and if no evidence of                            source of bleeding then needs VCE swallow.                           - The findings and recommendations were discussed  with the patient.                           - The findings and recommendations were discussed                            with the patient's family.                           - The findings and recommendations were discussed                            with the referring physician. Procedure Code(s):        --- Professional ---                           [phone], Small intestinal endoscopy, enteroscopy                            beyond second portion of duodenum, not including                            ileum; diagnostic, including collection of                            specimen(s) by brushing or washing, when performed                            (separate procedure)                           44799, Unlisted procedure, small intestine Diagnosis Code(s):        --- Professional ---                           K44.9, Diaphragmatic hernia without obstruction or                            gangrene                           D64.9, Anemia, unspecified                           K92.1, Melena (includes Hematochezia) CPT copyright 2019 American Medical Association. All rights reserved. The codes documented in this report are preliminary and upon coder review may  be revised to meet current compliance requirements. Justice Britain, MD 05/02/2020 2:58:07 PM Number of Addenda: 0

## 2020-05-02 NOTE — Progress Notes (Signed)
Due to history of AVMs of the colon, will proceed with SBE/EGD to see if any AVMs noted in the small bowel. Consent updated. Patient aware and agrees.  Justice Britain, MD [facility] Advanced [facility]oscopy Office # [phone]

## 2020-05-02 NOTE — ED Notes (Signed)
Per Dr Hilarie Fredrickson, states patient was suppose to have an endoscopy but his Hgb is 6.9-sending patient over for possible transfusion and admission

## 2020-05-02 NOTE — Telephone Encounter (Signed)
Patient seen recently by Matthew Savoy, NP.  History of dark heme positive stools and progressive anemia. He was coming for EGD today at the [facility] endoscopy center On arrival it was noted that he has severe aortic stenosis with his last echocardiogram showing aortic ejection velocity greater than 4.0.  This disqualifies him for monitored anesthesia care in the outpatient endoscopy center.  Endoscopic procedures will need to be performed in the outpatient hospital setting. Also it was noted that after seeing Matthew Serrano but before today's procedure he had a repeat CBC with a decline in hemoglobin to 6.3. He will need blood transfusion prior to consideration of endoscopic procedures I have discussed this with the patient, his brother and the charge nurse at [facility] emergency department  We are sending him to the emergency department via private car where the emergency room team is expecting him.  Serrano would be for admission, blood transfusion and endoscopic management of presumed subacute GI bleeding.  I will alert patient's primary doctor, Matthew Serrano and Matthew Serrano who is covering the hospital GI service this week/weekend

## 2020-05-02 NOTE — ED Notes (Addendum)
Date and time results received: 05/02/20 10:52 AM  (use smartphrase ".now" to insert current time)  Test: Hgb Critical Value: 6.7  Name of Provider Notified: Josh PA  Orders Received? Or Actions Taken?: Orders Received - See Orders for details

## 2020-05-03 ENCOUNTER — Inpatient Hospital Stay (HOSPITAL_COMMUNITY): Payer: Medicare Other | Admitting: Certified Registered Nurse Anesthetist

## 2020-05-03 ENCOUNTER — Encounter (HOSPITAL_COMMUNITY): Payer: Self-pay | Admitting: Internal Medicine

## 2020-05-03 ENCOUNTER — Encounter (HOSPITAL_COMMUNITY)
Admission: EM | Disposition: A | Discharge status: Home / Self Care | Payer: Self-pay | Source: Home / Self Care | Attending: Internal Medicine

## 2020-05-03 DIAGNOSIS — R509 Fever, unspecified: Secondary | ICD-10-CM

## 2020-05-03 DIAGNOSIS — E0800 Diabetes mellitus due to underlying condition with hyperosmolarity without nonketotic hyperglycemic-hyperosmolar coma (NKHHC): Secondary | ICD-10-CM

## 2020-05-03 DIAGNOSIS — K922 Gastrointestinal hemorrhage, unspecified: Secondary | ICD-10-CM

## 2020-05-03 DIAGNOSIS — Z794 Long term (current) use of insulin: Secondary | ICD-10-CM

## 2020-05-03 DIAGNOSIS — E02 Subclinical iodine-deficiency hypothyroidism: Secondary | ICD-10-CM

## 2020-05-03 HISTORY — PX: HOT HEMOSTASIS: SHX5433

## 2020-05-03 HISTORY — PX: HEMOSTASIS CLIP PLACEMENT: SHX6857

## 2020-05-03 HISTORY — PX: COLONOSCOPY: SHX5424

## 2020-05-03 LAB — GLUCOSE, CAPILLARY
Glucose-Capillary: 100 mg/dL — ABNORMAL HIGH (ref 70–99)
Glucose-Capillary: 102 mg/dL — ABNORMAL HIGH (ref 70–99)
Glucose-Capillary: 103 mg/dL — ABNORMAL HIGH (ref 70–99)
Glucose-Capillary: 109 mg/dL — ABNORMAL HIGH (ref 70–99)
Glucose-Capillary: 116 mg/dL — ABNORMAL HIGH (ref 70–99)
Glucose-Capillary: 162 mg/dL — ABNORMAL HIGH (ref 70–99)
Glucose-Capillary: 90 mg/dL (ref 70–99)

## 2020-05-03 LAB — BASIC METABOLIC PANEL
Anion gap: 13 (ref 5–15)
BUN: 5 mg/dL — ABNORMAL LOW (ref 8–23)
CO2: 20 mmol/L — ABNORMAL LOW (ref 22–32)
Calcium: 8.1 mg/dL — ABNORMAL LOW (ref 8.9–10.3)
Chloride: 111 mmol/L (ref 98–111)
Creatinine, Ser: 0.67 mg/dL (ref 0.61–1.24)
GFR calc Af Amer: >60 mL/min (ref >60)
GFR calc non Af Amer: >60 mL/min (ref >60)
Glucose, Bld: 105 mg/dL — ABNORMAL HIGH (ref 70–99)
Potassium: 2.6 mmol/L — CL (ref 3.5–5.1)
Sodium: 144 mmol/L (ref 135–145)

## 2020-05-03 LAB — CBC
HCT: 23.2 % — ABNORMAL LOW (ref 39.0–52.0)
Hemoglobin: 7.6 g/dL — ABNORMAL LOW (ref 13.0–17.0)
MCH: 27.4 pg (ref 26.0–34.0)
MCHC: 32.8 g/dL (ref 30.0–36.0)
MCV: 83.8 fL (ref 80.0–100.0)
Platelets: 281 10*3/uL (ref 150–400)
RBC: 2.77 MIL/uL — ABNORMAL LOW (ref 4.22–5.81)
RDW: 15 % (ref 11.5–15.5)
WBC: 9.5 10*3/uL (ref 4.0–10.5)
nRBC: 0 % (ref 0.0–0.2)

## 2020-05-03 LAB — PREPARE RBC (CROSSMATCH)

## 2020-05-03 LAB — MAGNESIUM: Magnesium: 1.6 mg/dL — ABNORMAL LOW (ref 1.7–2.4)

## 2020-05-03 SURGERY — COLONOSCOPY
Anesthesia: Monitor Anesthesia Care

## 2020-05-03 MED ORDER — LEVOTHYROXINE SODIUM 75 MCG PO TABS
150.0000 ug | ORAL_TABLET | Freq: Every day | ORAL | Status: DC
  Administered 2020-05-04: 150 ug via ORAL
  Filled 2020-05-03: qty 2

## 2020-05-03 MED ORDER — PROPOFOL 10 MG/ML IV BOLUS
INTRAVENOUS | Status: DC | PRN
  Administered 2020-05-03 (×2): 30 mg via INTRAVENOUS
  Administered 2020-05-03: 10 mg via INTRAVENOUS
  Administered 2020-05-03: 30 mg via INTRAVENOUS

## 2020-05-03 MED ORDER — SODIUM CHLORIDE 0.9% IV SOLUTION: Freq: Once | INTRAVENOUS | Status: DC

## 2020-05-03 MED ORDER — SODIUM CHLORIDE 0.9 % IV SOLN: INTRAVENOUS | Status: DC

## 2020-05-03 MED ORDER — PREDNISONE 5 MG PO TABS
5.0000 mg | ORAL_TABLET | Freq: Every day | ORAL | Status: DC
  Administered 2020-05-03 – 2020-05-04 (×2): 5 mg via ORAL
  Filled 2020-05-03 (×2): qty 1

## 2020-05-03 MED ORDER — HYDRALAZINE HCL 20 MG/ML IJ SOLN: 10.0000 mg | Freq: Four times a day (QID) | INTRAMUSCULAR | Status: DC | PRN

## 2020-05-03 MED ORDER — CLONIDINE HCL 0.2 MG PO TABS
0.2000 mg | ORAL_TABLET | Freq: Two times a day (BID) | ORAL | Status: DC
  Administered 2020-05-03 – 2020-05-04 (×3): 0.2 mg via ORAL
  Filled 2020-05-03 (×3): qty 1

## 2020-05-03 MED ORDER — MAGNESIUM SULFATE 4 GM/100ML IV SOLN
4.0000 g | Freq: Once | INTRAVENOUS | Status: AC
  Administered 2020-05-03: 4 g via INTRAVENOUS
  Filled 2020-05-03: qty 100

## 2020-05-03 MED ORDER — LIDOCAINE 2% (20 MG/ML) 5 ML SYRINGE
INTRAMUSCULAR | Status: DC | PRN
  Administered 2020-05-03: 60 mg via INTRAVENOUS

## 2020-05-03 MED ORDER — LACTATED RINGERS IV SOLN: INTRAVENOUS | Status: DC

## 2020-05-03 MED ORDER — SODIUM CHLORIDE 0.9% FLUSH
10.0000 mL | Freq: Two times a day (BID) | INTRAVENOUS | Status: DC
  Administered 2020-05-03: 10 mL

## 2020-05-03 MED ORDER — ACETAMINOPHEN 325 MG PO TABS
650.0000 mg | ORAL_TABLET | ORAL | Status: DC | PRN
  Administered 2020-05-03: 650 mg via ORAL
  Filled 2020-05-03: qty 2

## 2020-05-03 MED ORDER — CHLORHEXIDINE GLUCONATE CLOTH 2 % EX PADS
6.0000 | MEDICATED_PAD | Freq: Every day | CUTANEOUS | Status: DC
  Administered 2020-05-04: 6 via TOPICAL

## 2020-05-03 MED ORDER — POTASSIUM CHLORIDE 10 MEQ/100ML IV SOLN
INTRAVENOUS | Status: AC
  Filled 2020-05-03: qty 100

## 2020-05-03 MED ORDER — MAGNESIUM SULFATE 50 % IJ SOLN: 4.0000 g | Freq: Once | INTRAVENOUS | Status: DC

## 2020-05-03 MED ORDER — POTASSIUM CHLORIDE CRYS ER 20 MEQ PO TBCR: 20.0000 meq | EXTENDED_RELEASE_TABLET | Freq: Two times a day (BID) | ORAL | Status: DC

## 2020-05-03 MED ORDER — PROPOFOL 500 MG/50ML IV EMUL
INTRAVENOUS | Status: DC | PRN
  Administered 2020-05-03: 125 ug/kg/min via INTRAVENOUS

## 2020-05-03 MED ORDER — SODIUM CHLORIDE 0.9% FLUSH: 10.0000 mL | INTRAVENOUS | Status: DC | PRN

## 2020-05-03 MED ORDER — POTASSIUM CHLORIDE 10 MEQ/100ML IV SOLN
10.0000 meq | INTRAVENOUS | Status: AC
  Administered 2020-05-03 (×4): 10 meq via INTRAVENOUS
  Filled 2020-05-03 (×3): qty 100

## 2020-05-03 MED ORDER — ATORVASTATIN CALCIUM 40 MG PO TABS
40.0000 mg | ORAL_TABLET | Freq: Every day | ORAL | Status: DC
  Administered 2020-05-03 – 2020-05-04 (×2): 40 mg via ORAL
  Filled 2020-05-03 (×2): qty 1

## 2020-05-03 MED ORDER — NIFEDIPINE ER OSMOTIC RELEASE 60 MG PO TB24
60.0000 mg | ORAL_TABLET | Freq: Every day | ORAL | Status: DC
  Administered 2020-05-03 – 2020-05-04 (×2): 60 mg via ORAL
  Filled 2020-05-03 (×2): qty 1

## 2020-05-03 SURGICAL SUPPLY — 1 items: TOWEL COTTON PACK 4EA (MISCELLANEOUS) ×8 IMPLANT

## 2020-05-03 NOTE — Progress Notes (Signed)
[facility]                                                                              Patient Demographics  Matthew Serrano, is a 74 y.o. male, DOB - 08-Jul-1946, EKC:[id number]  Admit date - 05/02/2020   Admitting Physician Collier Bullock, MD  Outpatient Primary MD for the patient is [name], NP  Outpatient specialists:   LOS - 1  days   Medical records reviewed and are as summarized below:    No chief complaint on file.      Brief summary   HPI: Matthew Radu. is a 74 y.o. male with medical history significant for aortic stenosis, prostate cancer, diabetes mellitus, hypertension and hypothyroidism who was sent to the emergency room for evaluation of passage of melena stools as well as symptomatic anemia. Patient states that he has been passing dark stools for about a month and now complains of feeling dizzy, lightheaded and having generalized fatigue.  He also complains of exertional shortness of breath. He takes aspirin 81 mg daily but denies any other NSAID use. He denies having any chest pain, no abdominal pain, no cough, no urinary symptoms . Patient is fully vaccinated for the coronavirus and denies having any exposure to anyone who has COVID-19 Upon arrival to the ER he was found to have a fever with a T-max of 101.89F. Labs show sodium 140, potassium 3.3, chloride 109, bicarb 18, glucose 151, BUN 5, creatinine 0.86, calcium 8.2, AST 23, ALT 18, white count 10.9 with a left shift, hemoglobin 6.7 >> 9.2, hematocrit 21, MCV 83, RDW 14.8, platelet count 317 Chest x-ray reviewed by me shows no acute findings.  EKG showed sinus tachycardia   Assessment & Plan    Principal Problem: Symptomatic acute blood loss anemia, GI bleed with melanotic stools -Patient had significant drop in his imaging last 1 month, hemoglobin 6.7 at the time of admission -Transfuse 1 unit of packed RBCs, GI was consulted, patient was placed on IV Protonix -Underwent EGD  on 9/10 showed no gross lesions in esophagus, stomach, duodenum -Underwent colonoscopy today, showed diverticulosis in the entire colon with more diverticula in the ascending colon.  Multiple nonbleeding cecal angioectasis treated with APC -Continue clear liquid diet today, Protonix, will advance to soft solids in a.m. - no NSAIDs for 2 weeks -Hemoglobin 7.6, discussed with patient, will transfuse 1 unit packed RBC today  Active Problems: Severe hypokalemia, hypomagnesemia -Potassium 2.6, placed on IV potassium replacement,  - magnesium 1.6, place on IV 4g Mg x 1    Hypothyroidism -Continue Synthroid    Diabetes mellitus (HCC) -Hold oral hypoglycemics, continue sliding scale insulin while inpatient    Essential hypertension -BP elevated, resume oral antihypertensives, clonidine and nifedipine, Add as needed hydralazine  Fever of unclear etiology - T max of 101.5 in ED, no leukocytosis, no UTI, chest x-ray did not show acute findings -Continue to monitor, off antibiotics, afebrile today   History of Aortic stenosis Monitor patient closely for signs and symptoms of fluid overload with blood transfusion   History of prostate Cancer Follow up with oncology as an outpatient  Obesity Estimated body mass index is 32.89 kg/m as calculated from the following:   Height as of this encounter: 5\' 7"  (1.702 m).   Weight as of this encounter: 95.3 kg.  Code Status: DNR DVT Prophylaxis:  SCD's Family Communication: Discussed all imaging results, lab results, explained to the patient    Disposition Plan:     Status is: Inpatient  Remains inpatient appropriate because:Inpatient level of care appropriate due to severity of illness   Dispo: The patient is from: Home              Anticipated d/c is to: Home              Anticipated d/c date is: 1 day              Patient currently is not medically stable to d/c. Cscope today, anemia, requiring 1 unit of packed RBCs, if H&H stable,  likely DC home in a.m.      Time Spent in minutes   35 minutes  Procedures:  EGD Colonoscopy  Consultants:   Gastroenterology  Antimicrobials:   Anti-infectives (From admission, onward)   None          Medications  Scheduled Meds:  sodium chloride   Intravenous Once   bisacodyl  10 mg Oral Once   Chlorhexidine Gluconate Cloth  6 each Topical Daily   hydrocortisone sod succinate (SOLU-CORTEF) inj  20 mg Intravenous Daily   insulin aspart  0-15 Units Subcutaneous Q4H   Continuous Infusions:  0.9 % NaCl with KCl 40 mEq / L 50 mL/hr at 05/03/20 1138   lactated ringers 10 mL/hr at 05/03/20 0936   magnesium sulfate bolus IVPB     PRN Meds:.ondansetron **OR** ondansetron (ZOFRAN) IV      Subjective:   Matthew Serrano was seen and examined today.  *Returned from colonoscopy, no acute complaints, little bit sleepy.  BP elevated.  Patient denies dizziness, chest pain, shortness of breath, abdominal pain, nausea or vomiting.  Objective:   Vitals:   05/03/20 0934 05/03/20 1032 05/03/20 1044 05/03/20 1105  BP: (!) 172/81 (!) 166/78 (!) 164/83 (!) 161/79  Pulse: (!) 103 99 100   Resp: (!) 21 20 (!) 23 20  Temp: 98.7 F (37.1 C) 98.3 F (36.8 C)  98.7 F (37.1 C)  TempSrc: Oral Axillary  Oral  SpO2: 99% 96% 96% 100%  Weight: 95.3 kg     Height: 5\' 7"  (1.702 m)       Intake/Output Summary (Last 24 hours) at 05/03/2020 1254 Last data filed at 05/03/2020 1130 Gross per 24 hour  Intake 1578.27 ml  Output --  Net 1578.27 ml     Wt Readings from Last 3 Encounters:  05/03/20 95.3 kg  04/29/20 95.3 kg  04/24/20 92.5 kg     Exam  General: Alert and oriented x 3, NAD  Cardiovascular: S1 S2 auscultated, no murmurs, RRR  Respiratory: Clear to auscultation bilaterally, no wheezing, rales or rhonchi  Gastrointestinal: Soft, nontender, nondistended, + bowel sounds  Ext: no pedal edema bilaterally  Neuro: no new deficits  Musculoskeletal: No  digital cyanosis, clubbing  Skin: No rashes  Psych: Normal affect and demeanor, alert and oriented x3    Data Reviewed:  I have personally reviewed following labs and imaging studies  Micro Results Recent Results (from the past 240 hour(s))  SARS Coronavirus 2 by RT PCR (hospital order, performed in [place] hospital lab) Nasopharyngeal Nasopharyngeal Swab     Status: None  Collection Time: 05/02/20 10:19 AM   Specimen: Nasopharyngeal Swab  Result Value Ref Range Status   SARS Coronavirus 2 NEGATIVE NEGATIVE Final    Comment: (NOTE) SARS-CoV-2 target nucleic acids are NOT DETECTED.  The SARS-CoV-2 RNA is generally detectable in upper and lower respiratory specimens during the acute phase of infection. The lowest concentration of SARS-CoV-2 viral copies this assay can detect is 250 copies / mL. A negative result does not preclude SARS-CoV-2 infection and should not be used as the sole basis for treatment or other patient management decisions.  A negative result may occur with improper specimen collection / handling, submission of specimen other than nasopharyngeal swab, presence of viral mutation(s) within the areas targeted by this assay, and inadequate number of viral copies (<250 copies / mL). A negative result must be combined with clinical observations, patient history, and epidemiological information.  Fact Sheet for Patients:   StrictlyIdeas.no  Fact Sheet for Healthcare Providers: BankingDealers.co.za  This test is not yet approved or  cleared by the [place] FDA and has been authorized for detection and/or diagnosis of SARS-CoV-2 by FDA under an Emergency Use Authorization (EUA).  This EUA will remain in effect (meaning this test can be used) for the duration of the COVID-19 declaration under Section 564(b)(1) of the Act, 21 U.S.C. section 360bbb-3(b)(1), unless the authorization is terminated or revoked  sooner.  Performed at [facility], [place] [address] [place], [place] [address]     Radiology Reports DG Chest Port 1 View  Result Date: 05/02/2020 CLINICAL DATA:  Fever EXAM: PORTABLE CHEST 1 VIEW COMPARISON:  CT angiogram chest February 20, 2020. FINDINGS: The lungs are clear. Heart size is upper normal with pulmonary vascularity normal. No adenopathy. Port-A-Cath tip is in the superior vena cava. No pneumothorax. There is aortic atherosclerosis. 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 IMPRESSION: Lungs clear. Heart upper normal in size. Port-A-Cath tip in superior vena cava. Aortic Atherosclerosis (ICD10-I70.0). Electronically Signed   By: Lowella Grip III M.D.   On: 05/02/2020 10:45    Lab Data:  CBC: Recent Labs  Lab 04/29/20 0948 05/02/20 1019 05/02/20 1830 05/03/20 0215  WBC 10.1 10.9*  --  9.5  NEUTROABS 8.2* 8.7*  --   --   HGB 6.3* 6.7* 8.0* 7.6*  HCT 19.8* 21.0* 24.3* 23.2*  MCV 82.8 83.0  --  83.8  PLT 270 317  --  413   Basic Metabolic Panel: Recent Labs  Lab 04/29/20 0948 05/02/20 1019 05/03/20 0215  NA 136 140 144  K 4.2 3.3* 2.6*  CL 109 109 111  CO2 20* 18* 20*  GLUCOSE 129* 151* 105*  BUN 7* 5* 5*  CREATININE 0.87 0.86 0.67  CALCIUM 8.2* 8.2* 8.1*  MG  --   --  1.6*   GFR: Estimated Creatinine Clearance: 91.8 mL/min (by C-G formula based on SCr of 0.67 mg/dL). Liver Function Tests: Recent Labs  Lab 04/29/20 0948 05/02/20 1019  AST 17 23  ALT 13 18  ALKPHOS 64 64  BILITOT 0.6 0.8  PROT 5.5* 5.9*  ALBUMIN 2.2* 2.6*   No results for input(s): LIPASE, AMYLASE in the last 168 hours. No results for input(s): AMMONIA in the last 168 hours. Coagulation Profile: Recent Labs  Lab 05/02/20 1019  INR 1.2   Cardiac Enzymes: No results for input(s): CKTOTAL, CKMB, CKMBINDEX, TROPONINI in the last 168 hours. BNP (last 3 results) No results for input(s): PROBNP in the last 8760 hours. HbA1C: Recent  Labs     05/02/20 1019  HGBA1C 6.8*   CBG: Recent Labs  Lab 05/02/20 1957 05/03/20 0011 05/03/20 0434 05/03/20 0750 05/03/20 1133  GLUCAP 141* 90 102* 100* 103*   Lipid Profile: No results for input(s): CHOL, HDL, LDLCALC, TRIG, CHOLHDL, LDLDIRECT in the last 72 hours. Thyroid Function Tests: No results for input(s): TSH, T4TOTAL, FREET4, T3FREE, THYROIDAB in the last 72 hours. Anemia Panel: No results for input(s): VITAMINB12, FOLATE, FERRITIN, TIBC, IRON, RETICCTPCT in the last 72 hours. Urine analysis:    Component Value Date/Time   COLORURINE STRAW (A) 05/02/2020 [place] 05/02/2020 1214   LABSPEC 1.006 05/02/2020 1214   PHURINE 5.0 05/02/2020 1214   GLUCOSEU NEGATIVE 05/02/2020 1214   HGBUR SMALL (A) 05/02/2020 1214   HGBUR trace-intact 12/25/2009 0947   BILIRUBINUR NEGATIVE 05/02/2020 1214   BILIRUBINUR n 04/28/2016 0944   KETONESUR NEGATIVE 05/02/2020 1214   PROTEINUR NEGATIVE 05/02/2020 1214   UROBILINOGEN 0.2 04/28/2016 0944   UROBILINOGEN 0.2 12/25/2009 0947   NITRITE NEGATIVE 05/02/2020 1214   LEUKOCYTESUR NEGATIVE 05/02/2020 1214     Lear Carstens M.D. [facility] 05/03/2020, 12:54 PM   Call night coverage person covering after 7pm

## 2020-05-03 NOTE — Progress Notes (Signed)
CRITICAL VALUE ALERT  Critical Value:  K+ 2.6  Date & Time Notied:  05/03/2020 @ 0337  Provider Notified: NP Bl[place]  Orders Received/Actions taken:  Orders received to Increase current IVF- normal saline with 40 mEq of K+ to 50 ml/hr. Will carry at order now. Will cont to monitor.

## 2020-05-03 NOTE — Transfer of Care (Signed)
Immediate Anesthesia Transfer of Care Note  Patient: Matthew Serrano.  Procedure(s) Performed: COLONOSCOPY (N/A ) HOT HEMOSTASIS (ARGON PLASMA COAGULATION/BICAP) (N/A ) HEMOSTASIS CLIP PLACEMENT  Patient Location: Endoscopy Unit  Anesthesia Type:MAC  Level of Consciousness: awake, alert , oriented and patient cooperative  Airway & Oxygen Therapy: Patient Spontanous Breathing and Patient connected to face mask  Post-op Assessment: Report given to RN and Post -op Vital signs reviewed and stable  Post vital signs: Reviewed and stable  Last Vitals:  Vitals Value Taken Time  BP    Temp    Pulse    Resp    SpO2      Last Pain:  Vitals:   05/03/20 0934  TempSrc: Oral  PainSc: 0-No pain         Complications: No complications documented.

## 2020-05-03 NOTE — Anesthesia Preprocedure Evaluation (Addendum)
Anesthesia Evaluation  Patient identified by MRN, date of birth, ID band  Reviewed: Allergy & Precautions, NPO status , Patient's Chart, lab work & pertinent test results  Airway Mallampati: II  TM Distance: >3 FB Neck ROM: Full    Dental  (+) Poor Dentition, Missing,    Pulmonary former smoker,    breath sounds clear to auscultation       Cardiovascular hypertension, + Valvular Problems/Murmurs (mild AS) AS  Rhythm:Regular Rate:Normal + Systolic murmurs    Neuro/Psych    GI/Hepatic GI bleed   Endo/Other  diabetes, Type 2, Oral Hypoglycemic AgentsHypothyroidism obesity  Renal/GU      Musculoskeletal   Abdominal (+) + obese,   Peds  Hematology  (+) anemia , Hgb 7.6   Anesthesia Other Findings   Reproductive/Obstetrics                             Anesthesia Physical  Anesthesia Plan  ASA: III and emergent  Anesthesia Plan: MAC   Post-op Pain Management:    Induction: Intravenous  PONV Risk Score and Plan: 1 and Propofol infusion and TIVA  Airway Management Planned: Nasal Cannula  Additional Equipment:   Intra-op Plan:   Post-operative Plan:   Informed Consent: I have reviewed the patients History and Physical, chart, labs and discussed the procedure including the risks, benefits and alternatives for the proposed anesthesia with the patient or authorized representative who has indicated his/her understanding and acceptance.   Patient has DNR.  Discussed DNR with patient and Suspend DNR.     Plan Discussed with:   Anesthesia Plan Comments: (Plan for blood transfusion and IVGA natural airway (propofol gtt). Patient would like DNR suspended perioperatively. He would like all manner of resuscitation if he were to have a perioperative cardiopulmonary event. His upper teeth are VERY loose. Patient states he was to have them removed 9/10 at the dentist. I discussed he is at high risk  to swallow or even aspirate his teeth if they were to become dislodged.  He expressed understanding and wishes to proceed. K+ is low, Primary team is aware and is replacing. Likely due to bowel prep. Norton Blizzard, MD  )       Anesthesia Quick Evaluation

## 2020-05-03 NOTE — Anesthesia Postprocedure Evaluation (Signed)
Anesthesia Post Note  Patient: Matthew Serrano.  Procedure(s) Performed: COLONOSCOPY (N/A ) HOT HEMOSTASIS (ARGON PLASMA COAGULATION/BICAP) (N/A ) HEMOSTASIS CLIP PLACEMENT     Patient location during evaluation: PACU Anesthesia Type: MAC Level of consciousness: awake and alert Pain management: pain level controlled Vital Signs Assessment: post-procedure vital signs reviewed and stable Respiratory status: spontaneous breathing and respiratory function stable Cardiovascular status: stable Postop Assessment: no apparent nausea or vomiting Anesthetic complications: no   No complications documented.  Last Vitals:  Vitals:   05/03/20 1032 05/03/20 1044  BP: (!) 166/78 (!) 164/83  Pulse: 99 100  Resp: 20 (!) 23  Temp: 36.8 C   SpO2: 96% 96%    Last Pain:  Vitals:   05/03/20 1044  TempSrc:   PainSc: 0-No pain                 Merlinda Frederick

## 2020-05-03 NOTE — Op Note (Signed)
[facility] Patient Name: Matthew Serrano Procedure Date: 05/03/2020 MRN: [id number] Attending MD: Juanita Craver , MD Date of Birth: 1945/09/02 CSN: [id number] Age: 74 Admit Type: Inpatient Procedure:                Colonoscopy with ablation of cecal AVM's and                            placement opf 2 hemoclips. Indications:              Melena, Acute post hemorrhagic anemia, History of                            cecal AVM's. Providers:                Juanita Craver, MD, Josie Dixon, RN, Tyna Jaksch                            Technician, Dellie Catholic, Elmer Ramp Tilden Dome, RN,                            William Dalton, Technician Referring MD:             [name], NP Medicines:                Monitored Anesthesia Care Complications:            No immediate complications. Estimated Blood Loss:     Estimated blood loss was minimal. Procedure:                Pre-Anesthesia Assessment: - Prior to the                            procedure, a history and physical was performed,                            and patient medications and allergies were                            reviewed. The patient's tolerance of previous                            anesthesia was also reviewed. The risks and                            benefits of the procedure and the sedation options                            and risks were discussed with the patient. All                            questions were answered, and informed consent was                            obtained. Prior Anticoagulants: The patient has  taken no previous anticoagulant or antiplatelet                            agents. ASA Grade Assessment: III - A patient with                            severe systemic disease. After reviewing the risks                            and benefits, the patient was deemed in                            satisfactory condition to undergo the procedure.                             After obtaining informed consent, the colonoscope                            was passed under direct vision. Throughout the                            procedure, the patient's blood pressure, pulse, and                            oxygen saturations were monitored continuously. The                            [id number] ([id number]) Olympus colonoscope was                            introduced through the anus and advanced to the the                            cecum, identified by appendiceal orifice and                            ileocecal valve. The colonoscopy was extremely                            difficult due to inadequate bowel prep and                            patient's body habitus. Successful completion of                            the procedure was aided by lavage. The patient                            tolerated the procedure well. The quality of the                            bowel preparation was fair. The ileocecal valve,  the appendiceal orifice and the rectum were                            photographed. The bowel preparation used was                            MoviPrep via split dose instruction. Scope In: 9:46:40 AM Scope Out: 10:23:42 AM Scope Withdrawal Time: 0 hours 30 minutes 47 seconds  Total Procedure Duration: 0 hours 37 minutes 2 seconds  Findings:      Multiple small and large-mouthed diverticula were found in the entire       colon with more diverticula in the ascending colon.      Multiple angioectasias without bleeding were found in the cecum;       fulguration of the AVM;s was done by APC at 0.2 liters/minute and 20       watts was successful; estimated blood loss was minimal; there was       bleeding from 2 sites and therefore 2 hemostatic clips were successfully       placed (MR conditional); there was no bleeding at the end of the       procedure.      Small internal hemorrhoids were noted on retroflexion. Impression:                - Preparation of the colon was fair at best with                            agggresive lavage.                           - Scattered diverticulosis in the entire examined                            colon with more diverticula in the ascending colon.                           - Multiple non-bleeding cecal angioectasias;                            treated with argon plasma coagulation (APC); 2                            Clips (MR conditional) were placed.                           - Small internal hemorrhoids.                           - No specimens collected. Moderate Sedation:      MAC used. Recommendation:           - Clear liquid diet today.                           - Continue present medications.                           - Await pathology results.                           -  No Ibuprofen, Naproxen, or other non-steroidal                            anti-inflammatory drugs for 2 weeks. Procedure Code(s):        --- Professional ---                           [phone], Colonoscopy, flexible; with control of                            bleeding, any method Diagnosis Code(s):        --- Professional ---                           D62, Acute posthemorrhagic anemia                           K92.1, Melena (includes Hematochezia)                           K55.20, Angiodysplasia of colon without hemorrhage                           K57.30, Diverticulosis of large intestine without                            perforation or abscess without bleeding                           K64.8, Other hemorrhoids CPT copyright 2019 American Medical Association. All rights reserved. The codes documented in this report are preliminary and upon coder review may  be revised to meet current compliance requirements. Juanita Craver, MD Juanita Craver, MD 05/03/2020 10:50:33 AM This report has been signed electronically. Number of Addenda: 0

## 2020-05-03 NOTE — Progress Notes (Signed)
Pt completed Moviprep. Observed stool to be dark brown liquid with no sediment. Will make oncoming day shift nurse aware.

## 2020-05-04 DIAGNOSIS — I10 Essential (primary) hypertension: Secondary | ICD-10-CM

## 2020-05-04 LAB — BASIC METABOLIC PANEL
Anion gap: 10 (ref 5–15)
BUN: <5 mg/dL — ABNORMAL LOW (ref 8–23)
CO2: 24 mmol/L (ref 22–32)
Calcium: 7.8 mg/dL — ABNORMAL LOW (ref 8.9–10.3)
Chloride: 107 mmol/L (ref 98–111)
Creatinine, Ser: 0.66 mg/dL (ref 0.61–1.24)
GFR calc Af Amer: >60 mL/min (ref >60)
GFR calc non Af Amer: >60 mL/min (ref >60)
Glucose, Bld: 115 mg/dL — ABNORMAL HIGH (ref 70–99)
Potassium: 2.4 mmol/L — CL (ref 3.5–5.1)
Sodium: 141 mmol/L (ref 135–145)

## 2020-05-04 LAB — HEMOGLOBIN AND HEMATOCRIT, BLOOD
HCT: 25.8 % — ABNORMAL LOW (ref 39.0–52.0)
Hemoglobin: 8.3 g/dL — ABNORMAL LOW (ref 13.0–17.0)

## 2020-05-04 LAB — TYPE AND SCREEN
ABO/RH(D): A POS
Antibody Screen: NEGATIVE
Unit division: 0
Unit division: 0

## 2020-05-04 LAB — BPAM RBC
Blood Product Expiration Date: 202110042359
Blood Product Expiration Date: 202110062359
ISSUE DATE / TIME: 202109101406
ISSUE DATE / TIME: 202109111728
Unit Type and Rh: 6200
Unit Type and Rh: 6200

## 2020-05-04 LAB — URINE CULTURE: Culture: NO GROWTH

## 2020-05-04 LAB — GLUCOSE, CAPILLARY
Glucose-Capillary: 126 mg/dL — ABNORMAL HIGH (ref 70–99)
Glucose-Capillary: 90 mg/dL (ref 70–99)
Glucose-Capillary: 147 mg/dL — ABNORMAL HIGH (ref 70–99)
Glucose-Capillary: 214 mg/dL — ABNORMAL HIGH (ref 70–99)

## 2020-05-04 LAB — MAGNESIUM: Magnesium: 2.3 mg/dL (ref 1.7–2.4)

## 2020-05-04 LAB — POTASSIUM: Potassium: 3.6 mmol/L (ref 3.5–5.1)

## 2020-05-04 MED ORDER — POTASSIUM CHLORIDE CRYS ER 20 MEQ PO TBCR: 20.0000 meq | EXTENDED_RELEASE_TABLET | Freq: Two times a day (BID) | ORAL | Status: DC

## 2020-05-04 MED ORDER — HEPARIN SOD (PORK) LOCK FLUSH 100 UNIT/ML IV SOLN
500.0000 [IU] | INTRAVENOUS | Status: AC | PRN
  Administered 2020-05-04: 500 [IU]

## 2020-05-04 MED ORDER — POTASSIUM CHLORIDE 10 MEQ/100ML IV SOLN
10.0000 meq | INTRAVENOUS | Status: AC
  Administered 2020-05-04 (×4): 10 meq via INTRAVENOUS
  Filled 2020-05-04 (×3): qty 100

## 2020-05-04 MED ORDER — POTASSIUM CHLORIDE 10 MEQ/100ML IV SOLN
INTRAVENOUS | Status: AC
  Filled 2020-05-04: qty 100

## 2020-05-04 MED ORDER — POTASSIUM CHLORIDE CRYS ER 20 MEQ PO TBCR
40.0000 meq | EXTENDED_RELEASE_TABLET | ORAL | Status: AC
  Administered 2020-05-04 (×2): 40 meq via ORAL
  Filled 2020-05-04 (×2): qty 2

## 2020-05-04 NOTE — Progress Notes (Signed)
Patient discharged home, discharge instructions given and explained to patient, he verbalized understanding, denies any pain/distress, no wound or pressures injury noted.. Accompanied home by family.

## 2020-05-04 NOTE — Plan of Care (Signed)
  Problem: Clinical Measurements: Goal: Will remain free from infection Outcome: Progressing   Problem: Education: Goal: Knowledge of General Education information will improve Description: Including pain rating scale, medication(s)/side effects and non-pharmacologic comfort measures Outcome: Progressing   Problem: Coping: Goal: Level of anxiety will decrease Outcome: Progressing   Problem: Pain Managment: Goal: General experience of comfort will improve Outcome: Progressing   Problem: Skin Integrity: Goal: Risk for impaired skin integrity will decrease Outcome: Progressing

## 2020-05-04 NOTE — Discharge Summary (Signed)
Physician Discharge Summary   Patient ID: Matthew Serrano. MRN: [id number] DOB/AGE: [birth year] 73 y.o.  Admit date: 05/02/2020 Discharge date: 05/04/2020  Primary Care Physician:  [name], NP   Recommendations for Outpatient Follow-up:  1. Follow up with PCP in 1-2 weeks 2. Follow [facility] outpatient in 1 week ( has chronic hypokalemia)  Home Health:  Equipment/Devices:   Discharge Condition: stable  CODE STATUS: FULL  Diet recommendation: Heart healthy diet   Discharge Diagnoses:    . Symptomatic acute blood loss anemia Acute GI bleed, diverticular . Essential hypertension . Hypothyroidism Hypokalemia Diabetes mellitus, type II, NIDDM  Consults: Gastroenterology    Allergies:  No Known Allergies   DISCHARGE MEDICATIONS: Allergies as of 05/04/2020   No Known Allergies     Medication List    TAKE these medications   abiraterone acetate 250 MG tablet Commonly known as: ZYTIGA TAKE 4 TABLETS (1,000 MG TOTAL) BY MOUTH DAILY. TAKE ON AN EMPTY STOMACH 1 HOUR BEFORE OR 2 HOURS AFTER A MEAL   aspirin EC 81 MG tablet Take 81 mg by mouth daily.   atorvastatin 40 MG tablet Commonly known as: LIPITOR TAKE 1 TABLET EVERY DAY What changed: additional instructions Notes to patient: 05/05/20   cholecalciferol 25 MCG (1000 UNIT) tablet Commonly known as: VITAMIN D3 Take 1,000 Units by mouth daily.   cloNIDine 0.2 MG tablet Commonly known as: CATAPRES Take 1 tablet (0.2 mg total) by mouth 2 (two) times daily. Notes to patient: 05/04/20   doxazosin 8 MG tablet Commonly known as: CARDURA TAKE 1 TABLET AT BEDTIME What changed: additional instructions   ferrous sulfate 325 (65 FE) MG EC tablet Take 1 tablet (325 mg total) by mouth 2 (two) times daily before a meal.   glipiZIDE 10 MG 24 hr tablet Commonly known as: GLUCOTROL XL TAKE 1 TABLET TWICE DAILY What changed:   when to take this  additional instructions   labetalol 200 MG tablet Commonly  known as: NORMODYNE TAKE 3 TABLETS EVERY MORNING  AND TAKE 2 TABLETS EVERY EVENING What changed: See the new instructions.   levothyroxine 150 MCG tablet Commonly known as: SYNTHROID TAKE 1 TABLET EVERY DAY What changed:   when to take this  additional instructions Notes to patient: 05/05/20   lidocaine-prilocaine cream Commonly known as: EMLA Apply 1 application topically as needed. What changed: reasons to take this   metFORMIN 1000 MG tablet Commonly known as: GLUCOPHAGE TAKE 1 TABLET TWICE DAILY WITH MEALS   NIFEdipine 60 MG 24 hr tablet Commonly known as: PROCARDIA XL/NIFEDICAL XL TAKE 1 TABLET EVERY DAY What changed: additional instructions   omeprazole 40 MG capsule Commonly known as: PRILOSEC TAKE 1 CAPSULE BY MOUTH EVERY DAY What changed: how much to take Notes to patient: 05/05/20   pioglitazone 15 MG tablet Commonly known as: Actos Take 1 tablet (15 mg total) by mouth daily.   potassium chloride SA 20 MEQ tablet Commonly known as: KLOR-CON Take 1 tablet (20 mEq total) by mouth 2 (two) times daily. TAKE 1 TABLET TWICE DAILY Notes to patient: 05/04/20   predniSONE 5 MG tablet Commonly known as: DELTASONE TAKE 1 TABLET (5 MG TOTAL) BY MOUTH DAILY WITH BREAKFAST. Notes to patient: 05/05/20        Brief H and P: For complete details please refer to admission H and P, but in brief RDE:Matthew Serranois a 74 y.o.malewith medical history significant foraortic stenosis, prostate cancer,diabetes mellitus, hypertension and hypothyroidism who was sent to the  emergency room for evaluation of passage of melena stools as well as symptomatic anemia. Patient states that he has been passing dark stools for about a month and now complains of feeling dizzy, lightheaded and having generalized fatigue. He also complains of exertional shortness of breath. He takes aspirin 81 mg daily but denies any other NSAID use. He denies having any chest pain, no abdominal pain,  no cough, no urinary symptoms. Patient is fully vaccinated for the coronavirus and denies having any exposure to anyone who has COVID-19 Upon arrival to the ER he was found to have a fever with a T-max of101.64F. Labs show sodium 140, potassium 3.3, chloride 109, bicarb 18, glucose 151, BUN 5, creatinine 0.86, calcium 8.2, AST 23, ALT 18, white count 10.9with a left shift,hemoglobin 6.7 >> 9.2,hematocrit 21, MCV 83, RDW 14.8, platelet count 317 Chest x-ray reviewed by me shows no acute findings.  EKG showed sinus tachycardia  Hospital Course:   Symptomatic acute blood loss anemia, GI bleed with melanotic stools -Patient had significant drop in his imaging last 1 month, hemoglobin 6.7 at the time of admission -Transfuse 1 unit of packed RBCs, GI was consulted, patient was placed on IV Protonix -Underwent EGD on 9/10 showed no gross lesions in esophagus, stomach, duodenum -Underwent colonoscopy today, showed diverticulosis in the entire colon with more diverticula in the ascending colon.  Multiple nonbleeding cecal angioectasis treated with APC - no NSAIDs for 2 weeks -PPI, received 1 unit packed RBC transfusion on 9/11, hemoglobin 8.3 at the time of discharge   Severe hypokalemia, hypomagnesemia -Patient has a known history of hypokalemia, takes potassium 40 mEq daily    Hypothyroidism -Continue Synthroid    Diabetes mellitus ([place]) -Patient was placed on sliding scale insulin while inpatient    Essential hypertension -BP elevated, resume oral antihypertensives, clonidine and nifedipine, Add as needed hydralazine  Fever of unclear etiology - T max of 101.5 in ED, no leukocytosis, no UTI, chest x-ray did not show acute findings -Continue to monitor, off antibiotics, afebrile    History of Aortic stenosis Monitor patient closely for signs and symptoms of fluid overloadwith blood transfusion   History of prostate Cancer Follow up with oncology as an  outpatient  Obesity Estimated body mass index is 32.89 kg/m as calculated from the following:   Height as of this encounter: 5\' 7"  (1.702 m).   Weight as of this encounter: 95.3 kg.  Day of Discharge S: No acute complaints, tolerating diet without any difficulty  BP 109/74 (BP Location: Left Arm)   Pulse 92   Temp 100 F (37.8 C) (Oral)   Resp 16   Ht 5\' 7"  (1.702 m)   Wt 95.3 kg   SpO2 99%   BMI 32.89 kg/m   Physical Exam: General: Alert and awake oriented x3 not in any acute distress. HEENT: anicteric sclera, pupils reactive to light and accommodation CVS: S1-S2 clear no murmur rubs or gallops Chest: clear to auscultation bilaterally, no wheezing rales or rhonchi Abdomen: soft nontender, nondistended, normal bowel sounds Extremities: no cyanosis, clubbing or edema noted bilaterally Neuro: Cranial nerves II-XII intact, no focal neurological deficits    Get Medicines reviewed and adjusted: Please take all your medications with you for your next visit with your Primary MD  Please request your Primary MD to go over all hospital tests and procedure/radiological results at the follow up. Please ask your Primary MD to get all Hospital records sent to his/her office.  If you experience worsening of  your admission symptoms, develop shortness of breath, life threatening emergency, suicidal or homicidal thoughts you must seek medical attention immediately by calling 911 or calling your MD immediately  if symptoms less severe.  You must read complete instructions/literature along with all the possible adverse reactions/side effects for all the Medicines you take and that have been prescribed to you. Take any new Medicines after you have completely understood and accept all the possible adverse reactions/side effects.   Do not drive when taking pain medications.   Do not take more than prescribed Pain, Sleep and Anxiety Medications  Special Instructions: If you have smoked or  chewed Tobacco  in the last 2 yrs please stop smoking, stop any regular Alcohol  and or any Recreational drug use.  Wear Seat belts while driving.  Please note  You were cared for by a hospitalist during your hospital stay. Once you are discharged, your primary care physician will handle any further medical issues. Please note that NO REFILLS for any discharge medications will be authorized once you are discharged, as it is imperative that you return to your primary care physician (or establish a relationship with a primary care physician if you do not have one) for your aftercare needs so that they can reassess your need for medications and monitor your lab values.   The results of significant diagnostics from this hospitalization (including imaging, microbiology, ancillary and laboratory) are listed below for reference.      Procedures/Studies:  DG Chest Port 1 View  Result Date: 05/02/2020 CLINICAL DATA:  Fever EXAM: PORTABLE CHEST 1 VIEW COMPARISON:  CT angiogram chest February 20, 2020. FINDINGS: The lungs are clear. Heart size is upper normal with pulmonary vascularity normal. No adenopathy. Port-A-Cath tip is in the superior vena cava. No pneumothorax. There is aortic atherosclerosis. 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 IMPRESSION: Lungs clear. Heart upper normal in size. Port-A-Cath tip in superior vena cava. Aortic Atherosclerosis (ICD10-I70.0). Electronically Signed   By: Lowella Grip III M.D.   On: 05/02/2020 10:45      LAB RESULTS: Basic Metabolic Panel: Recent Labs  Lab 05/03/20 0215 05/03/20 0215 05/04/20 0343 05/04/20 1321  NA 144  --  141  --   K 2.6*   < > 2.4* 3.6  CL 111  --  107  --   CO2 20*  --  24  --   GLUCOSE 105*  --  115*  --   BUN 5*  --  <5*  --   CREATININE 0.67  --  0.66  --   CALCIUM 8.1*  --  7.8*  --   MG 1.6*   < > 2.3  --    < > = values in this interval not displayed.   Liver Function Tests: Recent Labs  Lab  04/29/20 0948 05/02/20 1019  AST 17 23  ALT 13 18  ALKPHOS 64 64  BILITOT 0.6 0.8  PROT 5.5* 5.9*  ALBUMIN 2.2* 2.6*   No results for input(s): LIPASE, AMYLASE in the last 168 hours. No results for input(s): AMMONIA in the last 168 hours. CBC: Recent Labs  Lab 05/02/20 1019 05/02/20 1830 05/03/20 0215 05/04/20 0343  WBC 10.9*  --  9.5  --   NEUTROABS 8.7*  --   --   --   HGB 6.7*   < > 7.6* 8.3*  HCT 21.0*   < > 23.2* 25.8*  MCV 83.0  --  83.8  --   PLT 317  --  281  --    < > = values in this interval not displayed.   Cardiac Enzymes: No results for input(s): CKTOTAL, CKMB, CKMBINDEX, TROPONINI in the last 168 hours. BNP: Invalid input(s): POCBNP CBG: Recent Labs  Lab 05/04/20 1129 05/04/20 1340  GLUCAP 147* 214*       Disposition and Follow-up: Discharge Instructions    Diet - low sodium heart healthy   Complete by: As directed    Increase activity slowly   Complete by: As directed        DISPOSITION: [facility]    [name], NP. Schedule an appointment as soon as possible for a visit in 2 week(s).   Specialty: Family Medicine Contact information: [place] [address] [phone]        [name], MD .   Specialty: Cardiology Contact information: [phone] [place] [address] [phone]        Mansouraty, Telford Nab., MD. Schedule an appointment as soon as possible for a visit in 2 week(s).   Specialties: Gastroenterology, Internal Medicine Contact information: [place] [address] [phone]                Time coordinating discharge:  35 mins   Signed:   Estill Cotta M.D. [facility] 05/04/2020, 4:53 PM

## 2020-05-04 NOTE — Progress Notes (Signed)
CROSS COVER [facility]-GI Subjective: Since I last evaluated the patient, he seems to be doing fairly well.  He had a colonoscopy yesterday with ablation of AVMs and placement of 2 hemoclips to control bleeding after the lesions had been APC.Marland KitchenPatient denies having any abdominal pain, nausea or vomiting at the present time and is anticipating discharge today.  He has not had a BM since the procedure.  Objective: Vital signs in last 24 hours: Temp:  [98.3 F (36.8 C)-100.4 F (38 C)] 100.2 F (37.9 C) (09/12 0452) Pulse Rate:  [82-108] 85 (09/12 0452) Resp:  [16-23] 18 (09/12 0452) BP: (122-166)/(66-83) 129/83 (09/12 0928) SpO2:  [96 %-100 %] 100 % (09/11 2126) Last BM Date: 05/03/20  Intake/Output from previous day: 09/11 0701 - 09/12 0700 In: 3131.9 [P.O.:720; I.V.:1822.9; Blood:434; IV Piggyback:155] Out: 1600 [Urine:1600] Intake/Output this shift: Total I/O In: -  Out: 300 [Urine:300]  General appearance: alert, cooperative, appears stated age, no distress and morbidly obese Resp: clear to auscultation bilaterally Cardio: regular rate and rhythm, S1, S2 normal, no murmur, click, rub or gallop GI: soft, non-tender; bowel sounds normal; no masses,  no organomegaly Extremities: extremities normal, atraumatic, no cyanosis or edema  Lab Results: Recent Labs    05/02/20 1019 05/02/20 1019 05/02/20 1830 05/03/20 0215 05/04/20 0343  WBC 10.9*  --   --  9.5  --   HGB 6.7*   < > 8.0* 7.6* 8.3*  HCT 21.0*   < > 24.3* 23.2* 25.8*  PLT 317  --   --  281  --    < > = values in this interval not displayed.   BMET Recent Labs    05/02/20 1019 05/03/20 0215 05/04/20 0343  NA 140 144 141  K 3.3* 2.6* 2.4*  CL 109 111 107  CO2 18* 20* 24  GLUCOSE 151* 105* 115*  BUN 5* 5* <5*  CREATININE 0.86 0.67 0.66  CALCIUM 8.2* 8.1* 7.8*   LFT Recent Labs    05/02/20 1019  PROT 5.9*  ALBUMIN 2.6*  AST 23  ALT 18  ALKPHOS 64  BILITOT 0.8   PT/INR Recent Labs    05/02/20 1019   LABPROT 14.3  INR 1.2   Hepatitis Panel No results for input(s): HEPBSAG, HCVAB, HEPAIGM, HEPBIGM in the last 72 hours. C-Diff No results for input(s): CDIFFTOX in the last 72 hours. No results for input(s): CDIFFPCR in the last 72 hours. Fecal Lactopherrin No results for input(s): FECLLACTOFRN in the last 72 hours.  Studies/Results: DG Chest Port 1 View  Result Date: 05/02/2020 CLINICAL DATA:  Fever EXAM: PORTABLE CHEST 1 VIEW COMPARISON:  CT angiogram chest February 20, 2020. FINDINGS: The lungs are clear. Heart size is upper normal with pulmonary vascularity normal. No adenopathy. Port-A-Cath tip is in the superior vena cava. No pneumothorax. There is aortic atherosclerosis. 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 IMPRESSION: Lungs clear. Heart upper normal in size. Port-A-Cath tip in superior vena cava. Aortic Atherosclerosis (ICD10-I70.0). Electronically Signed   By: Lowella Grip III M.D.   On: 05/02/2020 10:45    Medications: I have reviewed the patient's current medications.  Assessment/Plan: 1) Iron deficiency anemia secondary to GI blood loss from AVMs with have been APC yesterday; 2 hemoclips placed.  Patient was advised to see Dr. Lucio Edward within the next 7 to 10 days for close follow-up on outpatient basis. 2) AODM/Hypertension/Hypothyroidism. 3) Aortic stenosis.  LOS: 2 days   Juanita Craver 05/04/2020, 9:40 AM

## 2020-05-04 NOTE — Progress Notes (Signed)
CRITICAL VALUE ALERT  Critical Value:  K+ 2.4  Date & Time Notied:  05/04/2020 0540  Provider Notified: NP [place]  Orders Received/Actions taken: To infuse 4 runs of potassium. Will carry out order.

## 2020-05-05 ENCOUNTER — Telehealth: Payer: Self-pay | Admitting: Adult Health

## 2020-05-05 ENCOUNTER — Ambulatory Visit: Payer: Medicare Other | Admitting: Gastroenterology

## 2020-05-05 NOTE — Telephone Encounter (Signed)
Transition Care Management Unsuccessful Follow-up Telephone Call  Date of discharge and from where:  05/04/2020 from [facility]  Attempts:  1st Attempt  Reason for unsuccessful TCM follow-up call:  Left voice message

## 2020-05-06 ENCOUNTER — Encounter (HOSPITAL_COMMUNITY): Payer: Self-pay | Admitting: Gastroenterology

## 2020-05-07 NOTE — Telephone Encounter (Signed)
Transition Care Management Unsuccessful Follow-up Telephone Call  Date of discharge and from where:  05/04/2020 from [facility]  Attempts:  2nd Attempt  Reason for unsuccessful TCM follow-up call:  Left voice message

## 2020-05-08 ENCOUNTER — Encounter: Payer: Self-pay | Admitting: Adult Health

## 2020-05-08 NOTE — Telephone Encounter (Signed)
Letter sent to patient unable to reach

## 2020-05-09 ENCOUNTER — Telehealth: Payer: Self-pay | Admitting: Gastroenterology

## 2020-05-09 NOTE — Telephone Encounter (Signed)
Post hospital scheduled for 05/20/20 9:30

## 2020-05-10 ENCOUNTER — Other Ambulatory Visit: Payer: Self-pay

## 2020-05-10 ENCOUNTER — Encounter (HOSPITAL_COMMUNITY): Payer: Self-pay | Admitting: Emergency Medicine

## 2020-05-10 ENCOUNTER — Other Ambulatory Visit: Payer: Self-pay | Admitting: Adult Health

## 2020-05-10 DIAGNOSIS — K4090 Unilateral inguinal hernia, without obstruction or gangrene, not specified as recurrent: Secondary | ICD-10-CM | POA: Diagnosis not present

## 2020-05-10 DIAGNOSIS — Z87891 Personal history of nicotine dependence: Secondary | ICD-10-CM | POA: Insufficient documentation

## 2020-05-10 DIAGNOSIS — Z7982 Long term (current) use of aspirin: Secondary | ICD-10-CM | POA: Insufficient documentation

## 2020-05-10 DIAGNOSIS — E119 Type 2 diabetes mellitus without complications: Secondary | ICD-10-CM | POA: Insufficient documentation

## 2020-05-10 DIAGNOSIS — I739 Peripheral vascular disease, unspecified: Secondary | ICD-10-CM | POA: Diagnosis not present

## 2020-05-10 DIAGNOSIS — A419 Sepsis, unspecified organism: Secondary | ICD-10-CM | POA: Diagnosis not present

## 2020-05-10 DIAGNOSIS — M545 Low back pain: Secondary | ICD-10-CM | POA: Diagnosis not present

## 2020-05-10 DIAGNOSIS — R0602 Shortness of breath: Secondary | ICD-10-CM | POA: Insufficient documentation

## 2020-05-10 DIAGNOSIS — Z7984 Long term (current) use of oral hypoglycemic drugs: Secondary | ICD-10-CM | POA: Insufficient documentation

## 2020-05-10 DIAGNOSIS — I700 Atherosclerosis of aorta: Secondary | ICD-10-CM | POA: Diagnosis not present

## 2020-05-10 DIAGNOSIS — R531 Weakness: Secondary | ICD-10-CM | POA: Insufficient documentation

## 2020-05-10 DIAGNOSIS — Z20822 Contact with and (suspected) exposure to covid-19: Secondary | ICD-10-CM | POA: Insufficient documentation

## 2020-05-10 DIAGNOSIS — R509 Fever, unspecified: Secondary | ICD-10-CM | POA: Insufficient documentation

## 2020-05-10 DIAGNOSIS — R600 Localized edema: Secondary | ICD-10-CM | POA: Insufficient documentation

## 2020-05-10 DIAGNOSIS — Z8546 Personal history of malignant neoplasm of prostate: Secondary | ICD-10-CM | POA: Insufficient documentation

## 2020-05-10 DIAGNOSIS — Z9181 History of falling: Secondary | ICD-10-CM | POA: Insufficient documentation

## 2020-05-10 DIAGNOSIS — I10 Essential (primary) hypertension: Secondary | ICD-10-CM | POA: Insufficient documentation

## 2020-05-10 NOTE — ED Triage Notes (Signed)
Patient arrives with leg swelling . Patient has known swelling but states his swelling is worse. Patient used to be on lasix but states his doctor took him off of it. Patient states he tripped today over his sandal and "tumbled" at the house. Patient denies injuries, complaining of some lower back pain.

## 2020-05-11 ENCOUNTER — Emergency Department (HOSPITAL_COMMUNITY): Payer: Medicare Other

## 2020-05-11 ENCOUNTER — Emergency Department (HOSPITAL_COMMUNITY)
Admission: EM | Admit: 2020-05-11 | Discharge: 2020-05-11 | Disposition: A | Discharge status: Home / Self Care | Payer: Medicare Other | Source: Home / Self Care | Attending: Emergency Medicine | Admitting: Emergency Medicine

## 2020-05-11 DIAGNOSIS — I739 Peripheral vascular disease, unspecified: Secondary | ICD-10-CM | POA: Diagnosis not present

## 2020-05-11 DIAGNOSIS — M545 Low back pain, unspecified: Secondary | ICD-10-CM

## 2020-05-11 DIAGNOSIS — A419 Sepsis, unspecified organism: Secondary | ICD-10-CM | POA: Diagnosis not present

## 2020-05-11 DIAGNOSIS — Z8546 Personal history of malignant neoplasm of prostate: Secondary | ICD-10-CM | POA: Diagnosis not present

## 2020-05-11 DIAGNOSIS — K4090 Unilateral inguinal hernia, without obstruction or gangrene, not specified as recurrent: Secondary | ICD-10-CM | POA: Diagnosis not present

## 2020-05-11 DIAGNOSIS — R609 Edema, unspecified: Secondary | ICD-10-CM

## 2020-05-11 DIAGNOSIS — R509 Fever, unspecified: Secondary | ICD-10-CM

## 2020-05-11 DIAGNOSIS — I700 Atherosclerosis of aorta: Secondary | ICD-10-CM | POA: Diagnosis not present

## 2020-05-11 LAB — URINALYSIS, ROUTINE W REFLEX MICROSCOPIC
Bilirubin Urine: NEGATIVE
Glucose, UA: NEGATIVE mg/dL
Ketones, ur: NEGATIVE mg/dL
Leukocytes,Ua: NEGATIVE
Nitrite: NEGATIVE
Protein, ur: NEGATIVE mg/dL
Specific Gravity, Urine: 1.011 (ref 1.005–1.030)
pH: 5 (ref 5.0–8.0)

## 2020-05-11 LAB — CBC WITH DIFFERENTIAL/PLATELET
Abs Immature Granulocytes: 0.19 10*3/uL — ABNORMAL HIGH (ref 0.00–0.07)
Basophils Absolute: 0.1 10*3/uL (ref 0.0–0.1)
Basophils Relative: 0 %
Eosinophils Absolute: 0 10*3/uL (ref 0.0–0.5)
Eosinophils Relative: 0 %
HCT: 25 % — ABNORMAL LOW (ref 39.0–52.0)
Hemoglobin: 8.2 g/dL — ABNORMAL LOW (ref 13.0–17.0)
Immature Granulocytes: 1 %
Lymphocytes Relative: 7 %
Lymphs Abs: 1 10*3/uL (ref 0.7–4.0)
MCH: 27.7 pg (ref 26.0–34.0)
MCHC: 32.8 g/dL (ref 30.0–36.0)
MCV: 84.5 fL (ref 80.0–100.0)
Monocytes Absolute: 0.8 10*3/uL (ref 0.1–1.0)
Monocytes Relative: 6 %
Neutro Abs: 12.9 10*3/uL — ABNORMAL HIGH (ref 1.7–7.7)
Neutrophils Relative %: 86 %
Platelets: 266 10*3/uL (ref 150–400)
RBC: 2.96 MIL/uL — ABNORMAL LOW (ref 4.22–5.81)
RDW: 16.3 % — ABNORMAL HIGH (ref 11.5–15.5)
WBC: 15 10*3/uL — ABNORMAL HIGH (ref 4.0–10.5)
nRBC: 0 % (ref 0.0–0.2)

## 2020-05-11 LAB — COMPREHENSIVE METABOLIC PANEL
ALT: 40 U/L (ref 0–44)
AST: 34 U/L (ref 15–41)
Albumin: 2.5 g/dL — ABNORMAL LOW (ref 3.5–5.0)
Alkaline Phosphatase: 71 U/L (ref 38–126)
Anion gap: 10 (ref 5–15)
BUN: 10 mg/dL (ref 8–23)
CO2: 18 mmol/L — ABNORMAL LOW (ref 22–32)
Calcium: 8.3 mg/dL — ABNORMAL LOW (ref 8.9–10.3)
Chloride: 109 mmol/L (ref 98–111)
Creatinine, Ser: 0.75 mg/dL (ref 0.61–1.24)
GFR calc Af Amer: >60 mL/min (ref >60)
GFR calc non Af Amer: >60 mL/min (ref >60)
Glucose, Bld: 100 mg/dL — ABNORMAL HIGH (ref 70–99)
Potassium: 3.5 mmol/L (ref 3.5–5.1)
Sodium: 137 mmol/L (ref 135–145)
Total Bilirubin: 0.8 mg/dL (ref 0.3–1.2)
Total Protein: 5.7 g/dL — ABNORMAL LOW (ref 6.5–8.1)

## 2020-05-11 LAB — SARS CORONAVIRUS 2 BY RT PCR (HOSPITAL ORDER, PERFORMED IN ~~LOC~~ HOSPITAL LAB): SARS Coronavirus 2: NEGATIVE

## 2020-05-11 LAB — PROTIME-INR
INR: 1.2 (ref 0.8–1.2)
Prothrombin Time: 15.1 seconds (ref 11.4–15.2)

## 2020-05-11 LAB — APTT: aPTT: 50 seconds — ABNORMAL HIGH (ref 24–36)

## 2020-05-11 LAB — MAGNESIUM: Magnesium: 1.6 mg/dL — ABNORMAL LOW (ref 1.7–2.4)

## 2020-05-11 LAB — LACTIC ACID, PLASMA: Lactic Acid, Venous: 0.9 mmol/L (ref 0.5–1.9)

## 2020-05-11 MED ORDER — ACETAMINOPHEN 325 MG PO TABS
650.0000 mg | ORAL_TABLET | Freq: Once | ORAL | Status: AC
  Administered 2020-05-11: 650 mg via ORAL
  Filled 2020-05-11: qty 2

## 2020-05-11 MED ORDER — MAGNESIUM SULFATE 2 GM/50ML IV SOLN
2.0000 g | Freq: Once | INTRAVENOUS | Status: AC
  Administered 2020-05-11: 2 g via INTRAVENOUS
  Filled 2020-05-11: qty 50

## 2020-05-11 MED ORDER — HEPARIN SOD (PORK) LOCK FLUSH 100 UNIT/ML IV SOLN
500.0000 [IU] | Freq: Once | INTRAVENOUS | Status: AC
  Administered 2020-05-11: 500 [IU]
  Filled 2020-05-11: qty 5

## 2020-05-11 NOTE — ED Notes (Signed)
Pt ambulated in hallway. For the majority of the walk, pt was steady on his feet. He had one episode of stumbling. Pt does not appear short of breath or in pain, but is still c/o back pain. States that his back pain is better since the Tylenol

## 2020-05-11 NOTE — Discharge Instructions (Addendum)
Be sure to check your temperature each day.  If you start to have fevers above 100 again, please follow-up with your primary doctor.  If you have any new chest pain, shortness of breath, weakness, difficulty walking over the next 48 to 72 hours please return to the ER

## 2020-05-11 NOTE — ED Provider Notes (Signed)
[facility] DEPT Provider Note   CSN: [id number] Arrival date & time: 05/10/20  2056     History Chief Complaint  Patient presents with  . Leg Swelling  . Back Pain    Matthew Serrano. is a 74 y.o. male.  The history is provided by the patient.  Weakness Severity:  Moderate Onset quality:  Gradual Timing:  Constant Progression:  Worsening Chronicity:  New Relieved by:  Nothing Worsened by:  Activity Associated symptoms: falls, fever and shortness of breath   Associated symptoms: no abdominal pain, no chest pain, no dysuria, no numbness in extremities, no headaches, no hematochezia, no melena and no vomiting   Patient with history of aortic stenosis, prostate cancer, diabetes presents with generalized weakness.  Patient was admitted and discharged on September 12 after having acute anemia due to GI bleed. Since being discharged she has had generalized weakness and difficulty walking he did fall once, but no syncope.  No traumatic injuries.  He reports mild right lower back pain over the past day.  He also reports acute on chronic bilateral lower extremity swelling He also found out he has a fever when he checked into the ER. He reports mild shortness of breath but no chest pain or cough.  No vomiting.  No urinary symptoms   He denies any bloody stools Past Medical History:  Diagnosis Date  . Aortic stenosis    Echo 8/18: mild LVH, EF 60-65, no RWMA, mild AS (mean 10, peak 19), MAC, trivial MR, normal RVSF  // Echo 01/2019: EF 60-65, Gr 1 DD, mod AS (mean 22, peak 39)   . Cancer [facility])    prostate cancer  . DIABETES MELLITUS, TYPE II 07/06/2007  . ED (erectile dysfunction)   . Elevated PSA 01/20/2012  . Elevated PSA   . Heart murmur   . History of nuclear stress test    Myoview 8/18: EF 64, inferobasal thinning, no ischemia, low risk  . HYPERLIPIDEMIA 07/06/2007  . HYPERTENSION 07/06/2007  . HYPOTHYROIDISM 07/06/2007  . Leg pain    ABIs  8/18:  Normal   . Obesity     Patient Active Problem List   Diagnosis Date Noted  . Acute blood loss anemia 05/02/2020  . Malignant neoplasm of prostate ([place]) 06/23/2018  . Goals of care, counseling/discussion 06/23/2018  . Aortic stenosis   . Left inguinal hernia 09/04/2015  . Alcoholism ([place]) 06/10/2014  . Anemia of chronic disease 05/23/2014  . Elevated PSA 01/20/2012  . HEMORRHAGE OF RECTUM AND ANUS 12/15/2008  . ERECTILE DYSFUNCTION, MILD 12/14/2007  . SEBORRHEIC KERATOSIS, INFLAMED 08/03/2007  . NEOPLASM, SKIN, UNCERTAIN BEHAVIOR 69/67/8938  . Hypothyroidism 07/06/2007  . Diabetes mellitus ([place]) 07/06/2007  . Hyperlipidemia 07/06/2007  . Essential hypertension 07/06/2007    Past Surgical History:  Procedure Laterality Date  . COLONOSCOPY    . COLONOSCOPY N/A 05/03/2020   Procedure: COLONOSCOPY;  Surgeon: Juanita Craver, MD;  Location: [facility];  Service: Endoscopy;  Laterality: N/A;  . COLONOSCOPY W/ BIOPSIES AND POLYPECTOMY    . ENTEROSCOPY N/A 05/02/2020   Procedure: ENTEROSCOPY;  Surgeon: Rush Landmark Telford Nab., MD;  Location: [facility];  Service: Gastroenterology;  Laterality: N/A;  . HEMOSTASIS CLIP PLACEMENT  05/03/2020   Procedure: HEMOSTASIS CLIP PLACEMENT;  Surgeon: Juanita Craver, MD;  Location: [facility];  Service: Endoscopy;;  . HOT HEMOSTASIS N/A 05/03/2020   Procedure: HOT HEMOSTASIS (ARGON PLASMA COAGULATION/BICAP);  Surgeon: Juanita Craver, MD;  Location: [facility];  Service: Endoscopy;  Laterality: N/A;  . IR IMAGING GUIDED PORT INSERTION  07/05/2018  . POLYPECTOMY    . RIGHT/LEFT HEART CATH AND CORONARY ANGIOGRAPHY N/A 02/13/2020   Procedure: RIGHT/LEFT HEART CATH AND CORONARY ANGIOGRAPHY;  Surgeon: Belva Crome, MD;  Location: [facility];  Service: Cardiovascular;  Laterality: N/A;  . SUBMUCOSAL INJECTION  05/02/2020   Procedure: SUBMUCOSAL INJECTION;  Surgeon: Rush Landmark Telford Nab., MD;  Location: [facility];  Service:  Gastroenterology;;       Family History  Problem Relation Age of Onset  . Hypertension Other   . Breast cancer Sister   . Breast cancer Other        family history  . Breast cancer Brother        prostate ca  . Aneurysm Mother        Brain aneurysm   . Prostate cancer Father   . Colon cancer Neg Hx   . Esophageal cancer Neg Hx   . Stomach cancer Neg Hx   . Rectal cancer Neg Hx     Social History   Tobacco Use  . Smoking status: Former Research scientist (life sciences)  . Smokeless tobacco: Never Used  . Tobacco comment: quit 2005  Vaping Use  . Vaping Use: Never used  Substance Use Topics  . Alcohol use: Not Currently    Alcohol/week: 21.0 standard drinks    Types: 21 Cans of beer per week  . Drug use: No    Home Medications Prior to Admission medications   Medication Sig Start Date End Date Taking? Authorizing Provider  abiraterone acetate (ZYTIGA) 250 MG tablet TAKE 4 TABLETS (1,000 MG TOTAL) BY MOUTH DAILY. TAKE ON AN EMPTY STOMACH 1 HOUR BEFORE OR 2 HOURS AFTER A MEAL 04/16/20   Wyatt Portela, MD  aspirin EC 81 MG tablet Take 81 mg by mouth daily.    [provider]  atorvastatin (LIPITOR) 40 MG tablet TAKE 1 TABLET EVERY DAY Patient taking differently: Take 40 mg by mouth daily. TAKE 1 TABLET EVERY DAY 11/07/19   [name], [name], NP  cholecalciferol (VITAMIN D3) 25 MCG (1000 UT) tablet Take 1,000 Units by mouth daily.    [provider]  cloNIDine (CATAPRES) 0.2 MG tablet Take 1 tablet (0.2 mg total) by mouth 2 (two) times daily. 04/17/20 04/12/21  Eileen Stanford, PA-C  doxazosin (CARDURA) 8 MG tablet TAKE 1 TABLET AT BEDTIME Patient taking differently: Take 8 mg by mouth at bedtime. TAKE 1 TABLET AT BEDTIME 08/01/19   [name], [name], NP  ferrous sulfate 325 (65 FE) MG EC tablet Take 1 tablet (325 mg total) by mouth 2 (two) times daily before a meal. 04/24/20   Willia Craze, NP  glipiZIDE (GLUCOTROL XL) 10 MG 24 hr tablet TAKE 1 TABLET TWICE DAILY Patient taking  differently: Take 10 mg by mouth in the morning and at bedtime. TAKE 1 TABLET TWICE DAILY 01/16/20   [name], [name], NP  labetalol (NORMODYNE) 200 MG tablet TAKE 3 TABLETS EVERY MORNING  AND TAKE 2 TABLETS EVERY EVENING Patient taking differently: Take 400-600 mg by mouth See admin instructions. TAKE 3 TABLETS EVERY MORNING  AND TAKE 2 TABLETS EVERY EVENING 11/07/19   [name], [name], NP  levothyroxine (SYNTHROID) 150 MCG tablet TAKE 1 TABLET EVERY DAY Patient taking differently: Take 150 mcg by mouth daily before breakfast. TAKE 1 TABLET EVERY DAY 08/01/19   [name], [name], NP  lidocaine-prilocaine (EMLA) cream Apply 1 application topically as needed. Patient taking differently: Apply 1 application topically as  needed (port).  06/23/18   Wyatt Portela, MD  metFORMIN (GLUCOPHAGE) 1000 MG tablet TAKE 1 TABLET TWICE DAILY WITH MEALS Patient taking differently: Take 1,000 mg by mouth 2 (two) times daily with a meal.  04/02/20   [name], [name], NP  NIFEdipine (PROCARDIA XL/NIFEDICAL XL) 60 MG 24 hr tablet TAKE 1 TABLET EVERY DAY Patient taking differently: Take 60 mg by mouth daily. TAKE 1 TABLET EVERY DAY 11/07/19   [name], [name], NP  omeprazole (PRILOSEC) 40 MG capsule TAKE 1 CAPSULE BY MOUTH EVERY DAY Patient taking differently: Take 40 mg by mouth daily.  04/30/20   Willia Craze, NP  pioglitazone (ACTOS) 15 MG tablet Take 1 tablet (15 mg total) by mouth daily. 10/23/19   [name], [name], NP  potassium chloride SA (KLOR-CON) 20 MEQ tablet Take 1 tablet (20 mEq total) by mouth 2 (two) times daily. TAKE 1 TABLET TWICE DAILY 05/04/20   Rai, Ripudeep K, MD  predniSONE (DELTASONE) 5 MG tablet TAKE 1 TABLET (5 MG TOTAL) BY MOUTH DAILY WITH BREAKFAST. 10/17/19   Wyatt Portela, MD    Allergies    Patient has no known allergies.  Review of Systems   Review of Systems  Constitutional: Positive for fatigue and fever.  Respiratory: Positive for shortness of breath.   Cardiovascular: Negative for chest pain.    Gastrointestinal: Negative for abdominal pain, hematochezia, melena and vomiting.  Genitourinary: Negative for dysuria.  Musculoskeletal: Positive for back pain and falls.  Neurological: Positive for weakness. Negative for headaches.  All other systems reviewed and are negative.   Physical Exam Updated Vital Signs BP 127/62 (BP Location: Right Arm)   Pulse 99   Temp (!) 100.6 F (38.1 C) (Oral)   Resp (!) 22   SpO2 98%   Physical Exam CONSTITUTIONAL: Elderly, no acute distress HEAD: Normocephalic/atraumatic EYES: EOMI/PERRL ENMT: Mucous membranes moist NECK: supple no meningeal signs SPINE/BACK:entire spine nontender, no bruising/crepitance/stepoffs noted to spine CV: Z6/W1 noted, systolic ejection murmur noted LUNGS: Tachypnea, no crackles noted ABDOMEN: soft, nontender, no rebound or guarding, bowel sounds noted throughout abdomen GU: Right cva tenderness NEURO: Pt is awake/alert/appropriate, moves all extremitiesx4.  No facial droop.   EXTREMITIES: pulses normal/equal, full ROM, pitting edema noted bilateral lower extremities, left> right No deformities are noted SKIN: warm, color normal PSYCH: no abnormalities of mood noted, alert and oriented to situation  ED Results / Procedures / Treatments   Labs (all labs ordered are listed, but only abnormal results are displayed) Labs Reviewed  COMPREHENSIVE METABOLIC PANEL - Abnormal; Notable for the following components:      Result Value   CO2 18 (*)    Glucose, Bld 100 (*)    Calcium 8.3 (*)    Total Protein 5.7 (*)    Albumin 2.5 (*)    All other components within normal limits  CBC WITH DIFFERENTIAL/PLATELET - Abnormal; Notable for the following components:   WBC 15.0 (*)    RBC 2.96 (*)    Hemoglobin 8.2 (*)    HCT 25.0 (*)    RDW 16.3 (*)    Neutro Abs 12.9 (*)    Abs Immature Granulocytes 0.19 (*)    All other components within normal limits  APTT - Abnormal; Notable for the following components:   aPTT 50  (*)    All other components within normal limits  URINALYSIS, ROUTINE W REFLEX MICROSCOPIC - Abnormal; Notable for the following components:   Hgb urine dipstick MODERATE (*)  Bacteria, UA RARE (*)    All other components within normal limits  MAGNESIUM - Abnormal; Notable for the following components:   Magnesium 1.6 (*)    All other components within normal limits  SARS CORONAVIRUS 2 BY RT PCR (HOSPITAL ORDER, [facility])  URINE CULTURE  CULTURE, BLOOD (ROUTINE X 2)  LACTIC ACID, PLASMA  PROTIME-INR    EKG EKG Interpretation  Date/Time:  Sunday May 11 2020 01:08:05 EDT Ventricular Rate:  100 PR Interval:    QRS Duration: 159 QT Interval:  358 QTC Calculation: 462 R Axis:   0 Text Interpretation: Sinus tachycardia Left bundle branch block Confirmed by Ripley Fraise [phone]) on 05/11/2020 1:43:47 AM   Radiology DG Chest Port 1 View  Result Date: 05/11/2020 CLINICAL DATA:  Lower extremity edema, sepsis EXAM: PORTABLE CHEST 1 VIEW COMPARISON:  05/02/2020 FINDINGS: Single frontal view of the chest demonstrates a stable right chest wall port. Eventration of the right hemidiaphragm is again noted. No airspace disease, effusion, or pneumothorax. Cardiac silhouette is stable. IMPRESSION: 1. Stable exam, no acute process. Electronically Signed   By: Randa Ngo M.D.   On: 05/11/2020 01:59   CT Renal Stone Study  Result Date: 05/11/2020 CLINICAL DATA:  Flank pain, lower extremity swelling, history of prostate cancer EXAM: CT ABDOMEN AND PELVIS WITHOUT CONTRAST TECHNIQUE: Multidetector CT imaging of the abdomen and pelvis was performed following the standard protocol without IV contrast. COMPARISON:  02/20/2020, 06/12/2018 FINDINGS: Lower chest: Trace right pleural effusion. Mild right basilar atelectasis. Extensive coronary artery calcification. Global cardiac size within normal limits. There are extensive calcifications of the aortic valve leaflets.  Small pericardial effusion is present without CT evidence of cardiac tamponade. This is slightly enlarged when compared to prior examination. Hepatobiliary: No focal liver abnormality is seen. No gallstones, gallbladder wall thickening, or biliary dilatation. Pancreas: Unremarkable Spleen: Unremarkable Adrenals/Urinary Tract: Nodule within the a lateral limb of the right adrenal gland is unchanged. Nodular thickening of the left adrenal gland is unchanged. The kidneys are unremarkable. The bladder is unremarkable. Stomach/Bowel: Mild pancolonic diverticulosis. Endoscopic clips are seen within the cecum. The stomach, small bowel, and large bowel are otherwise unremarkable. Appendix normal. No free intraperitoneal gas or fluid. Vascular/Lymphatic: Moderate aortoiliac atherosclerotic calcification without evidence of aneurysm. Particularly prominent calcification is seen within the proximal renal arteries bilaterally as well as the mid SMA, however, the degree of stenosis is not well assessed on this non arteriographic examination. No aortic aneurysm. Stable bilateral pelvic sidewall adenopathy with lymph nodes measuring up to 12 mm in short axis diameter at axial image # 69. No other pathologic adenopathy within the abdomen and pelvis. These likely represent the residua of massive lymphadenopathy seen on remote prior examination. Reproductive: Prostate is normal in size, decreased since remote prior examination. Mild residual asymmetric enlargement of the left seminal vesicle. Other: Rectum unremarkable. 21 mm intramuscular lipoma within the right tensor fascia lata. Small fat containing left inguinal hernia. Musculoskeletal: No lytic or blastic bone lesions are seen. IMPRESSION: Extensive coronary artery calcification. Extensive calcifications of the aortic valve leaflets. Slight interval enlargement in small pericardial effusion. No definite radiographic explanation for the patient's reported flank pain. Stable  bilateral pelvic sidewall lymphadenopathy, likely the residua of a prior pathologic adenopathy related to the patient's underlying prostate cancer noted on remote prior examination. Peripheral vascular disease. If there is clinical suspicion of hemodynamically significant stenosis of the renal arteries (refractory hypertension, renal insufficiency), this could be better assessed with  CT arteriography. Aortic Atherosclerosis (ICD10-I70.0). Electronically Signed   By: Fidela Salisbury MD   On: 05/11/2020 04:26    Procedures Procedures   Medications Ordered in ED Medications  magnesium sulfate IVPB 2 g 50 mL (2 g Intravenous New Bag/Given 05/11/20 0316)  acetaminophen (TYLENOL) tablet 650 mg (650 mg Oral Given 05/11/20 0203)    ED Course  I have reviewed the triage vital signs and the nursing notes.  Pertinent labs & imaging results that were available during my care of the patient were reviewed by me and considered in my medical decision making (see chart for details).    MDM Rules/Calculators/A&P                         1:33 AM Patient recent admitted for GI bleed, found to have diverticular bleeding and improved with blood transfusion. Labs are pending this time. 3:16 AM No obvious signs of UTI.  Patient is mildly dehydrated with hypomagnesemia. Unclear source of his fever.  No headache or neck stiffness.  His legs have edema but no obvious redness to suggest cellulitis.  No abdominal tenderness, but does have consistent right flank pain.  Patient denies any midline spinal tenderness. He denies any recent traumatic injury to his flank.  He reports pain is worsening, but declines pain medicines Will proceed with CT renal to evaluate for any stones as he does have hematuria   This patient presents to the ED for concern of generalized weakness, this involves an extensive number of treatment options, and is a complaint that carries with it a high risk of complications and morbidity.  The  differential diagnosis includes pneumonia, urinary tract infection, electrolyte abnormality, acute anemia   Lab Tests:   I Ordered, reviewed, and interpreted labs, which included dialysis, complete blood count, electrolyte panel, Covid testing  Medicines ordered:   I ordered medication: Tylenol for fever  Imaging Studies ordered:   I ordered imaging studies which included chest x-ray   I independently visualized and interpreted imaging which showed no acute findings  Additional history obtained:   Additional history obtained from significant other  Previous records obtained and reviewed    Reevaluation:  After the interventions stated above, I reevaluated the patient and found patient is improved and ambulatory   5:41 AM No acute findings on CT imaging.  Patient with multiple incidental findings that did not require intervention.  Patient ambulatory.  His vitals are improved.  He reports his back pain is improved. On review of records, patient also had fever of unknown origin while in the hospital.  On today's evaluation I am unable to find a source for his fever. Urine and blood cultures are pending.  Covid negative.  No signs of meningitis.  No focal abdominal tenderness.  Chest x-ray is negative Hemoglobin is stable from discharge.  Magnesium was replaced Patient reports he does not want to start diuretics as of yet will follow as an outpatient.  He will also check his temperature daily and follow-up with PCP if any further temperature elevations  Merle Cirelli. was evaluated in Emergency Department on 05/11/2020 for the symptoms described in the history of present illness. He was evaluated in the context of the global COVID-19 pandemic, which necessitated consideration that the patient might be at risk for infection with the SARS-CoV-2 virus that causes COVID-19. Institutional protocols and algorithms that pertain to the evaluation of patients at risk for COVID-19 are  in a state  of rapid change based on information released by regulatory bodies including the CDC and federal and state organizations. These policies and algorithms were followed during the patient's care in the ED.   Final Clinical Impression(s) / ED Diagnoses Final diagnoses:  Peripheral edema  Acute right-sided low back pain without sciatica  Hypomagnesemia  Fever of unknown origin    Rx / DC Orders ED Discharge Orders    None       Ripley Fraise, MD 05/11/20 [phone]

## 2020-05-12 ENCOUNTER — Other Ambulatory Visit: Payer: Self-pay | Admitting: Physician Assistant

## 2020-05-12 ENCOUNTER — Other Ambulatory Visit: Payer: Self-pay | Admitting: *Deleted

## 2020-05-12 ENCOUNTER — Telehealth: Payer: Self-pay | Admitting: Physician Assistant

## 2020-05-12 MED ORDER — FUROSEMIDE 80 MG PO TABS: 40.0000 mg | ORAL_TABLET | Freq: Every day | ORAL | 11 refills | Status: DC

## 2020-05-12 MED ORDER — SULFAMETHOXAZOLE-TRIMETHOPRIM 800-160 MG PO TABS: 1.0000 | ORAL_TABLET | Freq: Two times a day (BID) | ORAL | 0 refills | Status: DC

## 2020-05-12 NOTE — Telephone Encounter (Signed)
  [facility]   MULTIDISCIPLINARY HEART VALVE TEAM   Patient called into our office to arrange follow-up.  He was recently admitted after an upper endoscopy due to acute blood loss anemia.  He underwent colonoscopy which showed AVMs which were treated with APC and was also transfused.  He then returned to the ER yesterday with lower extremity edema, fever and flank pain.  Urinalysis showed rare bacteria and moderate blood.  CT scan was nonacute.  Lab work revealed normal kidney function and leukocytosis.  Review of urine culture shows gram-negative rod growth.  I had previously stopped his Lasix in the setting of acute kidney injury and hypotension.  Given worsening of lower extremity edema and known severe aortic stenosis, I have asked him to resume his Lasix at half the dose (40 mg daily instead of 80 mg daily).  I have sent a message to his primary care nurse practitioner and requested that he get follow-up labs during his appointment on 9/24.  I will also call in a 5-day course of Bactrim given recent symptoms, elevated white count and abnormal urine culture.  I will discuss his case with our multidisciplinary valve team tomorrow to discuss timing of TAVR given recent events. He has follow up with GI on 9/28.  Angelena Form PA-C  [facility]

## 2020-05-12 NOTE — Patient Outreach (Signed)
[facility]) Care Management  05/12/2020  Leverett Camplin. May 18, 1946 [id number]   EMMI-GENERAL DISCHARGE-RESOLVED RED ON EMMI ALERT Day #4 Date:05/09/2020 Red Alert Reason: No follow up appointments  OUTREACH #1 RN spoke with pt concerning the above EMMI. Pt states he has not read his discharge information which has all followup information noted. RN reviewed the discharge information and confirmed his provider has call and pt admits she has not returned the call. RN review all three providers that were noted on his discharge paperwork to call and scheduled appointments within 2 weeks by this weekend. Strongly encouraged pt to call and make all requested appointments. Verified pt has contact numbers to all recommended providers to scheduled appointments. No further needs as pt had an ED visit on 9/19 for edema (treated and d/d home). No current issues however has not seen any other providers since his discharged on 9/12. Pt aware of the importance of contacting his providers for these appointments.   No other inquires or needs at this time. Will closed as resolved based upon pt's pending interventions on the above emmis. Pt very grateful for the call today.  Raina Mina, RN Care Management Coordinator [facility] Office [phone]

## 2020-05-13 ENCOUNTER — Other Ambulatory Visit: Payer: Self-pay

## 2020-05-13 ENCOUNTER — Inpatient Hospital Stay (HOSPITAL_COMMUNITY)
Admission: EM | Admit: 2020-05-13 | Discharge: 2020-05-23 | DRG: 853 | Disposition: A | Discharge status: Skilled Nursing Facility | Payer: Medicare Other | Attending: Thoracic Surgery (Cardiothoracic Vascular Surgery) | Admitting: Thoracic Surgery (Cardiothoracic Vascular Surgery)

## 2020-05-13 ENCOUNTER — Encounter: Payer: Self-pay | Admitting: Physician Assistant

## 2020-05-13 ENCOUNTER — Telehealth (HOSPITAL_BASED_OUTPATIENT_CLINIC_OR_DEPARTMENT_OTHER): Payer: Self-pay

## 2020-05-13 ENCOUNTER — Emergency Department (HOSPITAL_COMMUNITY): Payer: Medicare Other

## 2020-05-13 ENCOUNTER — Encounter (HOSPITAL_COMMUNITY): Payer: Self-pay | Admitting: Emergency Medicine

## 2020-05-13 ENCOUNTER — Inpatient Hospital Stay (HOSPITAL_COMMUNITY): Payer: Medicare Other

## 2020-05-13 DIAGNOSIS — I083 Combined rheumatic disorders of mitral, aortic and tricuspid valves: Secondary | ICD-10-CM | POA: Diagnosis not present

## 2020-05-13 DIAGNOSIS — Z8679 Personal history of other diseases of the circulatory system: Secondary | ICD-10-CM

## 2020-05-13 DIAGNOSIS — I5033 Acute on chronic diastolic (congestive) heart failure: Secondary | ICD-10-CM | POA: Diagnosis not present

## 2020-05-13 DIAGNOSIS — I340 Nonrheumatic mitral (valve) insufficiency: Secondary | ICD-10-CM

## 2020-05-13 DIAGNOSIS — Z452 Encounter for adjustment and management of vascular access device: Secondary | ICD-10-CM | POA: Diagnosis not present

## 2020-05-13 DIAGNOSIS — E1159 Type 2 diabetes mellitus with other circulatory complications: Secondary | ICD-10-CM

## 2020-05-13 DIAGNOSIS — R5381 Other malaise: Secondary | ICD-10-CM | POA: Diagnosis not present

## 2020-05-13 DIAGNOSIS — I352 Nonrheumatic aortic (valve) stenosis with insufficiency: Secondary | ICD-10-CM | POA: Diagnosis not present

## 2020-05-13 DIAGNOSIS — Z9221 Personal history of antineoplastic chemotherapy: Secondary | ICD-10-CM

## 2020-05-13 DIAGNOSIS — R0603 Acute respiratory distress: Secondary | ICD-10-CM | POA: Diagnosis not present

## 2020-05-13 DIAGNOSIS — C61 Malignant neoplasm of prostate: Secondary | ICD-10-CM | POA: Diagnosis present

## 2020-05-13 DIAGNOSIS — I5189 Other ill-defined heart diseases: Secondary | ICD-10-CM | POA: Diagnosis not present

## 2020-05-13 DIAGNOSIS — D638 Anemia in other chronic diseases classified elsewhere: Secondary | ICD-10-CM | POA: Diagnosis present

## 2020-05-13 DIAGNOSIS — Z48812 Encounter for surgical aftercare following surgery on the circulatory system: Secondary | ICD-10-CM | POA: Diagnosis not present

## 2020-05-13 DIAGNOSIS — Z6832 Body mass index (BMI) 32.0-32.9, adult: Secondary | ICD-10-CM | POA: Diagnosis not present

## 2020-05-13 DIAGNOSIS — Z7982 Long term (current) use of aspirin: Secondary | ICD-10-CM | POA: Diagnosis not present

## 2020-05-13 DIAGNOSIS — I110 Hypertensive heart disease with heart failure: Secondary | ICD-10-CM | POA: Diagnosis present

## 2020-05-13 DIAGNOSIS — I509 Heart failure, unspecified: Secondary | ICD-10-CM

## 2020-05-13 DIAGNOSIS — I351 Nonrheumatic aortic (valve) insufficiency: Secondary | ICD-10-CM | POA: Diagnosis not present

## 2020-05-13 DIAGNOSIS — Z7952 Long term (current) use of systemic steroids: Secondary | ICD-10-CM

## 2020-05-13 DIAGNOSIS — E876 Hypokalemia: Secondary | ICD-10-CM | POA: Diagnosis present

## 2020-05-13 DIAGNOSIS — J9811 Atelectasis: Secondary | ICD-10-CM

## 2020-05-13 DIAGNOSIS — I10 Essential (primary) hypertension: Secondary | ICD-10-CM | POA: Diagnosis not present

## 2020-05-13 DIAGNOSIS — E02 Subclinical iodine-deficiency hypothyroidism: Secondary | ICD-10-CM | POA: Diagnosis not present

## 2020-05-13 DIAGNOSIS — J90 Pleural effusion, not elsewhere classified: Secondary | ICD-10-CM | POA: Diagnosis not present

## 2020-05-13 DIAGNOSIS — Z66 Do not resuscitate: Secondary | ICD-10-CM | POA: Diagnosis not present

## 2020-05-13 DIAGNOSIS — R2689 Other abnormalities of gait and mobility: Secondary | ICD-10-CM | POA: Diagnosis not present

## 2020-05-13 DIAGNOSIS — I358 Other nonrheumatic aortic valve disorders: Secondary | ICD-10-CM

## 2020-05-13 DIAGNOSIS — E669 Obesity, unspecified: Secondary | ICD-10-CM | POA: Diagnosis present

## 2020-05-13 DIAGNOSIS — Z20822 Contact with and (suspected) exposure to covid-19: Secondary | ICD-10-CM | POA: Diagnosis present

## 2020-05-13 DIAGNOSIS — Z954 Presence of other heart-valve replacement: Secondary | ICD-10-CM

## 2020-05-13 DIAGNOSIS — E039 Hypothyroidism, unspecified: Secondary | ICD-10-CM | POA: Diagnosis present

## 2020-05-13 DIAGNOSIS — I313 Pericardial effusion (noninflammatory): Secondary | ICD-10-CM | POA: Diagnosis not present

## 2020-05-13 DIAGNOSIS — I350 Nonrheumatic aortic (valve) stenosis: Secondary | ICD-10-CM

## 2020-05-13 DIAGNOSIS — Z8619 Personal history of other infectious and parasitic diseases: Secondary | ICD-10-CM | POA: Diagnosis present

## 2020-05-13 DIAGNOSIS — N179 Acute kidney failure, unspecified: Secondary | ICD-10-CM | POA: Diagnosis not present

## 2020-05-13 DIAGNOSIS — A408 Other streptococcal sepsis: Secondary | ICD-10-CM | POA: Diagnosis not present

## 2020-05-13 DIAGNOSIS — A419 Sepsis, unspecified organism: Secondary | ICD-10-CM

## 2020-05-13 DIAGNOSIS — I517 Cardiomegaly: Secondary | ICD-10-CM | POA: Diagnosis not present

## 2020-05-13 DIAGNOSIS — M2550 Pain in unspecified joint: Secondary | ICD-10-CM | POA: Diagnosis not present

## 2020-05-13 DIAGNOSIS — R918 Other nonspecific abnormal finding of lung field: Secondary | ICD-10-CM | POA: Diagnosis not present

## 2020-05-13 DIAGNOSIS — Z7401 Bed confinement status: Secondary | ICD-10-CM | POA: Diagnosis not present

## 2020-05-13 DIAGNOSIS — E119 Type 2 diabetes mellitus without complications: Secondary | ICD-10-CM | POA: Diagnosis present

## 2020-05-13 DIAGNOSIS — K089 Disorder of teeth and supporting structures, unspecified: Secondary | ICD-10-CM

## 2020-05-13 DIAGNOSIS — R7881 Bacteremia: Secondary | ICD-10-CM

## 2020-05-13 DIAGNOSIS — Z8719 Personal history of other diseases of the digestive system: Secondary | ICD-10-CM

## 2020-05-13 DIAGNOSIS — E785 Hyperlipidemia, unspecified: Secondary | ICD-10-CM | POA: Diagnosis present

## 2020-05-13 DIAGNOSIS — Z79899 Other long term (current) drug therapy: Secondary | ICD-10-CM

## 2020-05-13 DIAGNOSIS — Z7984 Long term (current) use of oral hypoglycemic drugs: Secondary | ICD-10-CM | POA: Diagnosis not present

## 2020-05-13 DIAGNOSIS — I5031 Acute diastolic (congestive) heart failure: Secondary | ICD-10-CM | POA: Diagnosis not present

## 2020-05-13 DIAGNOSIS — I772 Rupture of artery: Secondary | ICD-10-CM | POA: Diagnosis not present

## 2020-05-13 DIAGNOSIS — I330 Acute and subacute infective endocarditis: Secondary | ICD-10-CM | POA: Diagnosis not present

## 2020-05-13 DIAGNOSIS — Z23 Encounter for immunization: Secondary | ICD-10-CM | POA: Diagnosis not present

## 2020-05-13 DIAGNOSIS — Z952 Presence of prosthetic heart valve: Secondary | ICD-10-CM

## 2020-05-13 DIAGNOSIS — R0902 Hypoxemia: Secondary | ICD-10-CM | POA: Diagnosis not present

## 2020-05-13 DIAGNOSIS — Z87891 Personal history of nicotine dependence: Secondary | ICD-10-CM

## 2020-05-13 DIAGNOSIS — D62 Acute posthemorrhagic anemia: Secondary | ICD-10-CM | POA: Diagnosis not present

## 2020-05-13 DIAGNOSIS — Z79890 Hormone replacement therapy (postmenopausal): Secondary | ICD-10-CM | POA: Diagnosis not present

## 2020-05-13 DIAGNOSIS — D649 Anemia, unspecified: Secondary | ICD-10-CM | POA: Diagnosis present

## 2020-05-13 DIAGNOSIS — E0800 Diabetes mellitus due to underlying condition with hyperosmolarity without nonketotic hyperglycemic-hyperosmolar coma (NKHHC): Secondary | ICD-10-CM | POA: Diagnosis not present

## 2020-05-13 DIAGNOSIS — I2510 Atherosclerotic heart disease of native coronary artery without angina pectoris: Secondary | ICD-10-CM | POA: Diagnosis not present

## 2020-05-13 DIAGNOSIS — I38 Endocarditis, valve unspecified: Secondary | ICD-10-CM | POA: Diagnosis not present

## 2020-05-13 DIAGNOSIS — M6281 Muscle weakness (generalized): Secondary | ICD-10-CM | POA: Diagnosis not present

## 2020-05-13 DIAGNOSIS — B955 Unspecified streptococcus as the cause of diseases classified elsewhere: Secondary | ICD-10-CM | POA: Diagnosis present

## 2020-05-13 DIAGNOSIS — I5030 Unspecified diastolic (congestive) heart failure: Secondary | ICD-10-CM

## 2020-05-13 DIAGNOSIS — A491 Streptococcal infection, unspecified site: Secondary | ICD-10-CM | POA: Diagnosis not present

## 2020-05-13 DIAGNOSIS — I339 Acute and subacute endocarditis, unspecified: Secondary | ICD-10-CM | POA: Diagnosis not present

## 2020-05-13 HISTORY — DX: Other nonrheumatic aortic valve disorders: I35.8

## 2020-05-13 HISTORY — DX: Nonrheumatic aortic (valve) insufficiency: I35.1

## 2020-05-13 LAB — BLOOD CULTURE ID PANEL (REFLEXED) - BCID2

## 2020-05-13 LAB — URINALYSIS, ROUTINE W REFLEX MICROSCOPIC
Bacteria, UA: NONE SEEN
Bilirubin Urine: NEGATIVE
Glucose, UA: NEGATIVE mg/dL
Ketones, ur: NEGATIVE mg/dL
Leukocytes,Ua: NEGATIVE
Nitrite: NEGATIVE
Protein, ur: NEGATIVE mg/dL
Specific Gravity, Urine: 1.016 (ref 1.005–1.030)
pH: 5 (ref 5.0–8.0)

## 2020-05-13 LAB — CBC WITH DIFFERENTIAL/PLATELET
Abs Immature Granulocytes: 0.22 10*3/uL — ABNORMAL HIGH (ref 0.00–0.07)
Basophils Absolute: 0 10*3/uL (ref 0.0–0.1)
Basophils Relative: 0 %
Eosinophils Absolute: 0.1 10*3/uL (ref 0.0–0.5)
Eosinophils Relative: 0 %
HCT: 24 % — ABNORMAL LOW (ref 39.0–52.0)
Hemoglobin: 7.7 g/dL — ABNORMAL LOW (ref 13.0–17.0)
Immature Granulocytes: 2 %
Lymphocytes Relative: 9 %
Lymphs Abs: 1.2 10*3/uL (ref 0.7–4.0)
MCH: 27.2 pg (ref 26.0–34.0)
MCHC: 32.1 g/dL (ref 30.0–36.0)
MCV: 84.8 fL (ref 80.0–100.0)
Monocytes Absolute: 0.8 10*3/uL (ref 0.1–1.0)
Monocytes Relative: 6 %
Neutro Abs: 11.3 10*3/uL — ABNORMAL HIGH (ref 1.7–7.7)
Neutrophils Relative %: 83 %
Platelets: 240 10*3/uL (ref 150–400)
RBC: 2.83 MIL/uL — ABNORMAL LOW (ref 4.22–5.81)
RDW: 16.5 % — ABNORMAL HIGH (ref 11.5–15.5)
WBC: 13.6 10*3/uL — ABNORMAL HIGH (ref 4.0–10.5)
nRBC: 0 % (ref 0.0–0.2)

## 2020-05-13 LAB — BRAIN NATRIURETIC PEPTIDE: B Natriuretic Peptide: 484 pg/mL — ABNORMAL HIGH (ref 0.0–100.0)

## 2020-05-13 LAB — ECHOCARDIOGRAM COMPLETE
AR max vel: 1.76 cm2
AV Area VTI: 1.9 cm2
AV Area mean vel: 1.77 cm2
AV Mean grad: 12 mmHg
AV Peak grad: 22.1 mmHg
Ao pk vel: 2.35 m/s
Area-P 1/2: 7.66 cm2
Height: 67 in
S' Lateral: 3.3 cm
Weight: 3360 oz

## 2020-05-13 LAB — COMPREHENSIVE METABOLIC PANEL
ALT: 37 U/L (ref 0–44)
AST: 45 U/L — ABNORMAL HIGH (ref 15–41)
Albumin: 2.3 g/dL — ABNORMAL LOW (ref 3.5–5.0)
Alkaline Phosphatase: 84 U/L (ref 38–126)
Anion gap: 14 (ref 5–15)
BUN: 18 mg/dL (ref 8–23)
CO2: 16 mmol/L — ABNORMAL LOW (ref 22–32)
Calcium: 8.4 mg/dL — ABNORMAL LOW (ref 8.9–10.3)
Chloride: 103 mmol/L (ref 98–111)
Creatinine, Ser: 1.05 mg/dL (ref 0.61–1.24)
GFR calc Af Amer: >60 mL/min (ref >60)
GFR calc non Af Amer: >60 mL/min (ref >60)
Glucose, Bld: 96 mg/dL (ref 70–99)
Potassium: 4.7 mmol/L (ref 3.5–5.1)
Sodium: 133 mmol/L — ABNORMAL LOW (ref 135–145)
Total Bilirubin: 0.7 mg/dL (ref 0.3–1.2)
Total Protein: 5.7 g/dL — ABNORMAL LOW (ref 6.5–8.1)

## 2020-05-13 LAB — PROTIME-INR
INR: 1.2 (ref 0.8–1.2)
Prothrombin Time: 15 seconds (ref 11.4–15.2)

## 2020-05-13 LAB — LACTIC ACID, PLASMA
Lactic Acid, Venous: 3.2 mmol/L (ref 0.5–1.9)
Lactic Acid, Venous: 2.1 mmol/L (ref 0.5–1.9)

## 2020-05-13 LAB — SARS CORONAVIRUS 2 BY RT PCR (HOSPITAL ORDER, PERFORMED IN ~~LOC~~ HOSPITAL LAB): SARS Coronavirus 2: NEGATIVE

## 2020-05-13 LAB — MAGNESIUM: Magnesium: 1.8 mg/dL (ref 1.7–2.4)

## 2020-05-13 LAB — APTT: aPTT: 43 seconds — ABNORMAL HIGH (ref 24–36)

## 2020-05-13 MED ORDER — PNEUMOCOCCAL VAC POLYVALENT 25 MCG/0.5ML IJ INJ
0.5000 mL | INJECTION | INTRAMUSCULAR | Status: DC
  Filled 2020-05-13: qty 0.5

## 2020-05-13 MED ORDER — LACTATED RINGERS IV BOLUS
2000.0000 mL | Freq: Once | INTRAVENOUS | Status: AC
  Administered 2020-05-13: 2000 mL via INTRAVENOUS

## 2020-05-13 MED ORDER — FUROSEMIDE 10 MG/ML IJ SOLN
40.0000 mg | Freq: Once | INTRAMUSCULAR | Status: AC
  Administered 2020-05-13: 40 mg via INTRAVENOUS
  Filled 2020-05-13: qty 4

## 2020-05-13 MED ORDER — INFLUENZA VAC A&B SA ADJ QUAD 0.5 ML IM PRSY
0.5000 mL | PREFILLED_SYRINGE | INTRAMUSCULAR | Status: DC
  Filled 2020-05-13: qty 0.5

## 2020-05-13 MED ORDER — SODIUM CHLORIDE 0.9 % IV SOLN
2.0000 g | Freq: Once | INTRAVENOUS | Status: AC
  Administered 2020-05-13: 2 g via INTRAVENOUS
  Filled 2020-05-13: qty 20

## 2020-05-13 MED ORDER — SODIUM CHLORIDE 0.9 % IV SOLN
500.0000 mg | Freq: Once | INTRAVENOUS | Status: AC
  Administered 2020-05-13: 500 mg via INTRAVENOUS
  Filled 2020-05-13: qty 500

## 2020-05-13 MED ORDER — LACTATED RINGERS IV SOLN: INTRAVENOUS | Status: DC

## 2020-05-13 MED ORDER — METOPROLOL TARTRATE 5 MG/5ML IV SOLN: 5.0000 mg | Freq: Three times a day (TID) | INTRAVENOUS | Status: DC | PRN

## 2020-05-13 MED ORDER — ASPIRIN EC 81 MG PO TBEC
81.0000 mg | DELAYED_RELEASE_TABLET | Freq: Every day | ORAL | Status: DC
  Administered 2020-05-14: 81 mg via ORAL
  Filled 2020-05-13: qty 1

## 2020-05-13 MED ORDER — SODIUM CHLORIDE 0.9 % IV SOLN
2.0000 g | INTRAVENOUS | Status: DC
  Administered 2020-05-14 – 2020-05-22 (×8): 2 g via INTRAVENOUS
  Filled 2020-05-13 (×2): qty 2
  Filled 2020-05-13 (×2): qty 20
  Filled 2020-05-13 (×6): qty 2

## 2020-05-13 MED ORDER — CLONIDINE HCL 0.1 MG PO TABS: 0.2000 mg | ORAL_TABLET | Freq: Two times a day (BID) | ORAL | Status: DC

## 2020-05-13 MED ORDER — SODIUM CHLORIDE 0.9% FLUSH
3.0000 mL | Freq: Two times a day (BID) | INTRAVENOUS | Status: DC
  Administered 2020-05-13 – 2020-05-14 (×2): 3 mL via INTRAVENOUS

## 2020-05-13 MED ORDER — ABIRATERONE ACETATE 250 MG PO TABS
1000.0000 mg | ORAL_TABLET | Freq: Every day | ORAL | Status: DC
  Filled 2020-05-13: qty 4

## 2020-05-13 MED ORDER — DOXAZOSIN MESYLATE 8 MG PO TABS
8.0000 mg | ORAL_TABLET | Freq: Every day | ORAL | Status: DC
  Filled 2020-05-13: qty 1
  Filled 2020-05-13: qty 4

## 2020-05-13 MED ORDER — ATORVASTATIN CALCIUM 40 MG PO TABS
40.0000 mg | ORAL_TABLET | Freq: Every day | ORAL | Status: DC
  Administered 2020-05-13: 40 mg via ORAL
  Filled 2020-05-13: qty 1

## 2020-05-13 MED ORDER — LEVOTHYROXINE SODIUM 75 MCG PO TABS
150.0000 ug | ORAL_TABLET | Freq: Every day | ORAL | Status: DC
  Administered 2020-05-13 – 2020-05-15 (×2): 150 ug via ORAL
  Filled 2020-05-13: qty 1
  Filled 2020-05-13: qty 2
  Filled 2020-05-13: qty 1

## 2020-05-13 MED ORDER — SODIUM CHLORIDE 0.9 % IV SOLN
1.0000 g | Freq: Once | INTRAVENOUS | Status: DC
  Filled 2020-05-13: qty 10

## 2020-05-13 MED ORDER — PANTOPRAZOLE SODIUM 40 MG PO TBEC
40.0000 mg | DELAYED_RELEASE_TABLET | Freq: Every day | ORAL | Status: DC
  Administered 2020-05-13 – 2020-05-14 (×2): 40 mg via ORAL
  Filled 2020-05-13 (×2): qty 1

## 2020-05-13 MED ORDER — PREDNISONE 5 MG PO TABS: 5.0000 mg | ORAL_TABLET | Freq: Every day | ORAL | Status: DC

## 2020-05-13 NOTE — H&P (Signed)
History and Physical        Hospital Admission Note Date: 05/13/2020  Patient name: Matthew Serrano. Medical record number: [id number] Date of birth: 10-09-1945 Age: 74 y.o. Gender: male  PCP: [name], NP  Patient coming from: Home Lives with: Alone At baseline, ambulates: Independently  Chief Complaint    Chief Complaint  Patient presents with  . Abnormal Lab      HPI:   This is a 74 year old male with past medical history of prostate cancer on oral hormone therapy (abiraterone acetate), type 2 diabetes, hypertension, hyperlipidemia, AVM of colon, severe AS, HFpEF, hypothyroidism with multiple hospitalizations recently who presented today with abnormal lab work: Strep species bacteremia.  He was recently hospitalized from 9/10-9/12 for a symptomatic blood loss anemia secondary to GI bleed.  He then returned to the ED on 9/19 with generalized weakness and difficulty walking with one fall but no syncope and fever.  At that time he had hypomagnesemia which was repleted, and unremarkable CT scan and had labs drawn and was discharged from the ED with Bactrim for presumed UTI.  Today, his blood cultures resulted positive for Streptococcus species (not Enterococcus, Streptococcus agalactiae, strep pyogenes or strep pneumoniae) and was contacted this a.m. by the ED to return to the hospital for further work-up.  Currently, the patient is clearly with increased work of breathing and wheezing and some respiratory distress.  He states he was not wheezing yesterday and this is new today other than this he denies any complaints of chest pain, nausea, vomiting, recurrent GI bleed or any other complaints.  ED Course: Afebrile and hemodynamically stable on room air with soft BP.  Notable labs: Sodium 133, CO2 16, AG 14, BNP 484, lactic acid 3.2, WBC 13.6, Hb 7.7.  A CXR showed mild  central pulmonary vascular engorgement with increased opacities in the lung bases concerning for atelectasis versus developing infection particularly at the right base.  He was given 2 L LR bolus as well as ceftriaxone and azithromycin to cover for CAP and strep bacteremia.  Vitals:   05/13/20 1236 05/13/20 1430  BP: (!) 103/44 111/68  Pulse: 76 85  Resp: 14 (!) 23  Temp:    SpO2: 98% 96%     Review of Systems:  Review of Systems  Constitutional: Negative for chills and fever.  Respiratory: Positive for shortness of breath and wheezing.   Cardiovascular: Negative for chest pain and palpitations.  Gastrointestinal: Negative for abdominal pain, nausea and vomiting.  Musculoskeletal: Negative for myalgias.  Skin: Negative for rash.  All other systems reviewed and are negative.   Medical/Social/Family History   Past Medical History: Past Medical History:  Diagnosis Date  . AVM (arteriovenous malformation) of colon   . Cancer [facility])    prostate cancer  . DIABETES MELLITUS, TYPE II 07/06/2007  . ED (erectile dysfunction)   . Elevated PSA 01/20/2012  . Elevated PSA   . History of nuclear stress test    Myoview 8/18: EF 64, inferobasal thinning, no ischemia, low risk  . HYPERLIPIDEMIA 07/06/2007  . HYPERTENSION 07/06/2007  . HYPOTHYROIDISM 07/06/2007  . Leg pain    ABIs 8/18:  Normal   . Obesity   .  Severe aortic stenosis     Past Surgical History:  Procedure Laterality Date  . COLONOSCOPY    . COLONOSCOPY N/A 05/03/2020   Procedure: COLONOSCOPY;  Surgeon: Juanita Craver, MD;  Location: [facility];  Service: Endoscopy;  Laterality: N/A;  . COLONOSCOPY W/ BIOPSIES AND POLYPECTOMY    . ENTEROSCOPY N/A 05/02/2020   Procedure: ENTEROSCOPY;  Surgeon: Rush Landmark Telford Nab., MD;  Location: [facility];  Service: Gastroenterology;  Laterality: N/A;  . HEMOSTASIS CLIP PLACEMENT  05/03/2020   Procedure: HEMOSTASIS CLIP PLACEMENT;  Surgeon: Juanita Craver, MD;  Location: [facility];   Service: Endoscopy;;  . HOT HEMOSTASIS N/A 05/03/2020   Procedure: HOT HEMOSTASIS (ARGON PLASMA COAGULATION/BICAP);  Surgeon: Juanita Craver, MD;  Location: [facility];  Service: Endoscopy;  Laterality: N/A;  . IR IMAGING GUIDED PORT INSERTION  07/05/2018  . POLYPECTOMY    . RIGHT/LEFT HEART CATH AND CORONARY ANGIOGRAPHY N/A 02/13/2020   Procedure: RIGHT/LEFT HEART CATH AND CORONARY ANGIOGRAPHY;  Surgeon: Belva Crome, MD;  Location: [facility];  Service: Cardiovascular;  Laterality: N/A;  . SUBMUCOSAL INJECTION  05/02/2020   Procedure: SUBMUCOSAL INJECTION;  Surgeon: Rush Landmark Telford Nab., MD;  Location: [facility];  Service: Gastroenterology;;    Medications: Prior to Admission medications   Medication Sig Start Date End Date Taking? Authorizing Provider  abiraterone acetate (ZYTIGA) 250 MG tablet TAKE 4 TABLETS (1,000 MG TOTAL) BY MOUTH DAILY. TAKE ON AN EMPTY STOMACH 1 HOUR BEFORE OR 2 HOURS AFTER A MEAL 04/16/20   Wyatt Portela, MD  aspirin EC 81 MG tablet Take 81 mg by mouth daily.    [provider]  atorvastatin (LIPITOR) 40 MG tablet TAKE 1 TABLET EVERY DAY Patient taking differently: Take 40 mg by mouth daily. TAKE 1 TABLET EVERY DAY 11/07/19   [name], [name], NP  cholecalciferol (VITAMIN D3) 25 MCG (1000 UT) tablet Take 1,000 Units by mouth daily.    [provider]  cloNIDine (CATAPRES) 0.2 MG tablet Take 1 tablet (0.2 mg total) by mouth 2 (two) times daily. 04/17/20 04/12/21  Eileen Stanford, PA-C  doxazosin (CARDURA) 8 MG tablet TAKE 1 TABLET AT BEDTIME Patient taking differently: Take 8 mg by mouth at bedtime. TAKE 1 TABLET AT BEDTIME 08/01/19   [name], [name], NP  ferrous sulfate 325 (65 FE) MG EC tablet Take 1 tablet (325 mg total) by mouth 2 (two) times daily before a meal. 04/24/20   Willia Craze, NP  fosinopril (MONOPRIL) 40 MG tablet Take 80 mg by mouth daily. 05/08/20   [provider]  furosemide (LASIX) 40 MG tablet Take 80 mg  by mouth every morning. 05/08/20   [provider]  furosemide (LASIX) 80 MG tablet Take 0.5 tablets (40 mg total) by mouth daily. 05/12/20 05/12/21  Eileen Stanford, PA-C  glipiZIDE (GLUCOTROL XL) 10 MG 24 hr tablet TAKE 1 TABLET TWICE DAILY Patient taking differently: Take 10 mg by mouth in the morning and at bedtime. TAKE 1 TABLET TWICE DAILY 01/16/20   [name], [name], NP  labetalol (NORMODYNE) 200 MG tablet TAKE 3 TABLETS EVERY MORNING  AND TAKE 2 TABLETS EVERY EVENING Patient taking differently: Take 400-600 mg by mouth See admin instructions. TAKE 3 TABLETS EVERY MORNING  AND TAKE 2 TABLETS EVERY EVENING 11/07/19   [name], [name], NP  levothyroxine (SYNTHROID) 150 MCG tablet TAKE 1 TABLET EVERY DAY Patient taking differently: Take 150 mcg by mouth daily before breakfast. TAKE 1 TABLET EVERY DAY 08/01/19   [name], NP  lidocaine-prilocaine (EMLA) cream Apply 1 application topically as needed. Patient taking differently: Apply 1 application topically as needed (port).  06/23/18   Wyatt Portela, MD  metFORMIN (GLUCOPHAGE) 1000 MG tablet TAKE 1 TABLET TWICE DAILY WITH MEALS Patient taking differently: Take 1,000 mg by mouth 2 (two) times daily with a meal.  04/02/20   [name], [name], NP  NIFEdipine (PROCARDIA XL/NIFEDICAL XL) 60 MG 24 hr tablet TAKE 1 TABLET EVERY DAY Patient taking differently: Take 60 mg by mouth daily. TAKE 1 TABLET EVERY DAY 11/07/19   [name], [name], NP  omeprazole (PRILOSEC) 40 MG capsule TAKE 1 CAPSULE BY MOUTH EVERY DAY Patient taking differently: Take 40 mg by mouth daily.  04/30/20   Willia Craze, NP  pioglitazone (ACTOS) 15 MG tablet TAKE 1 TABLET BY MOUTH EVERY DAY 05/12/20   [name], [name], NP  potassium chloride SA (KLOR-CON) 20 MEQ tablet Take 1 tablet (20 mEq total) by mouth 2 (two) times daily. TAKE 1 TABLET TWICE DAILY 05/04/20   Rai, Ripudeep K, MD  predniSONE (DELTASONE) 5 MG tablet TAKE 1 TABLET (5 MG TOTAL) BY MOUTH DAILY WITH BREAKFAST.  10/17/19   Wyatt Portela, MD  sulfamethoxazole-trimethoprim (BACTRIM DS) 800-160 MG tablet Take 1 tablet by mouth 2 (two) times daily. 05/12/20   Eileen Stanford, PA-C    Allergies:  No Known Allergies  Social History:  reports that he has quit smoking. He has never used smokeless tobacco. He reports previous alcohol use of about 21.0 standard drinks of alcohol per week. He reports that he does not use drugs.  Family History: Family History  Problem Relation Age of Onset  . Hypertension Other   . Breast cancer Sister   . Breast cancer Other        family history  . Breast cancer Brother        prostate ca  . Aneurysm Mother        Brain aneurysm   . Prostate cancer Father   . Colon cancer Neg Hx   . Esophageal cancer Neg Hx   . Stomach cancer Neg Hx   . Rectal cancer Neg Hx      Objective   Physical Exam: Blood pressure 111/68, pulse 85, temperature 98.4 F (36.9 C), temperature source Oral, resp. rate (!) 23, height 5\' 7"  (1.702 m), weight 95.3 kg, SpO2 96 %.  Physical Exam Vitals and nursing note reviewed.  Constitutional:      General: He is in acute distress.  HENT:     Head: Normocephalic and atraumatic.  Eyes:     Extraocular Movements: Extraocular movements intact.     Pupils: Pupils are equal, round, and reactive to light.  Cardiovascular:     Rate and Rhythm: Normal rate and regular rhythm.  Pulmonary:     Effort: Respiratory distress present.     Breath sounds: Wheezing present.     Comments: Difficult to auscultate but had audible wheezes without the stethoscope and wet breath sounds Abdominal:     General: Abdomen is flat.     Palpations: Abdomen is soft.  Musculoskeletal:        General: No tenderness or deformity.     Comments: 2+ bilateral lower extremity edema  Skin:    General: Skin is warm.     Coloration: Skin is not jaundiced.  Neurological:     General: No focal deficit present.     Mental Status: He is alert.  Psychiatric:  Mood and Affect: Mood normal.        Behavior: Behavior normal.     LABS on Admission: I have personally reviewed all the labs and imaging below    Basic Metabolic Panel: Recent Labs  Lab 05/11/20 0114 05/13/20 1204  NA 137 133*  K 3.5 4.7  CL 109 103  CO2 18* 16*  GLUCOSE 100* 96  BUN 10 18  CREATININE 0.75 1.05  CALCIUM 8.3* 8.4*  MG 1.6*  --    Liver Function Tests: Recent Labs  Lab 05/11/20 0114 05/13/20 1204  AST 34 45*  ALT 40 37  ALKPHOS 71 84  BILITOT 0.8 0.7  PROT 5.7* 5.7*  ALBUMIN 2.5* 2.3*   No results for input(s): LIPASE, AMYLASE in the last 168 hours. No results for input(s): AMMONIA in the last 168 hours. CBC: Recent Labs  Lab 05/11/20 0114 05/11/20 0114 05/13/20 1204  WBC 15.0*  --  13.6*  NEUTROABS 12.9*   < > 11.3*  HGB 8.2*  --  7.7*  HCT 25.0*  --  24.0*  MCV 84.5   < > 84.8  PLT 266  --  240   < > = values in this interval not displayed.   Cardiac Enzymes: No results for input(s): CKTOTAL, CKMB, CKMBINDEX, TROPONINI in the last 168 hours. BNP: Invalid input(s): POCBNP CBG: No results for input(s): GLUCAP in the last 168 hours.  Radiological Exams on Admission:  DG Chest Port 1 View  Result Date: 05/13/2020 CLINICAL DATA:  Questionable sepsis. EXAM: PORTABLE CHEST 1 VIEW COMPARISON:  05/11/2020 FINDINGS: RIGHT-sided Port-A-Cath in stable position. Trachea midline.  Cardiomediastinal contours are stable. Mild central pulmonary vascular engorgement suggested with increasing opacities in the lung bases without signs of dense consolidative change. No definite effusion. On limited assessment skeletal structures without acute process. IMPRESSION: Mild central pulmonary vascular engorgement suggested with increasing opacities in the lung bases without signs of dense consolidative change. Findings could represent atelectasis or developing infection particularly at the RIGHT lung base. Electronically Signed   By: Zetta Bills M.D.   On:  05/13/2020 12:04      EKG: Independently reviewed.  Possible ST depressions in lateral leads but patient was moving, there is a poor EKG.  We will repeat   A & P   Principal Problem:   Bacteremia Active Problems:   Hypothyroidism   Diabetes mellitus ([place])   Hyperlipidemia   Essential hypertension   Aortic stenosis   Malignant neoplasm of prostate (H[facility])   Acute CHF (congestive heart failure) (H[facility])   Respiratory distress   1. Streptococcus species bacteremia of unknown source, concern for endocarditis and possibly developing sepsis a. Sepsis criteria: Tachypnea, leukocytosis, lactic acid 3.2 b. Blood cultures from 9/19: Streptococcus species (not Enterococcus, Streptococcus agalactiae, strep pyogenes or strep pneumoniae)  c. Elevated BNP on admission: 484 with a history of severe AS and no obvious source of infection concerning for endocarditis d. TTE. Will probably need a TEE e. Repeat blood cultures f. Continue ceftriaxone g. Hold Azithromycin - unlikely pneumonia h. ID consulted, discussed with Dr. Johnnye Sima  2. Acute on chronic heart failure exacerbation, suspect diastolic a. Likely multifactorial: Suspected endocarditis in the setting of severe AS and received IV fluids for sepsis protocol b. Respiratory distress at bedside with intermittent hypoxia on room air between 88%-92% with conversation c. BNP 44 d. Echo 5/78/4696: Grade 1 diastolic dysfunction with EF 65 to 70%, mild LVH and severe AS e. Lasix 40 mg IV x1 now  f. Daily weights and intake/output monitoring g. Repeat echo as above h. Pioglitaone can cause CHF  3. Respiratory distress secondary to heart failure exacerbation and atelectasis a. Atelectasis on CXR and CHF as above b. Unlikely CAP c. Hold Azithromycin, Continue ceftriaxone  d. Incentive spirometry and volume management   4. Generalized weakness with recent fall a. PT eval  5. Hypertension a. continue home doxazosin and clonidine to prevent  rebound hypertension and started on as needed metoprolol  6. AKI, likely multifactorial: cardiorenal, developing sepsis  a. Creatinine 1.05 today, was 0.75 two days ago b. Follow up in AM  7. Type 2 diabetes a. Glucose currently 96, monitor for now  8. History of prostate cancer  a. on hormone therapy b. Continue doxazosin   DVT prophylaxis: scd's, recent GI bleed   Code Status: DNR  Diet: Heart healthy/carb modified Family Communication: Admission, patients condition and plan of care including tests being ordered have been discussed with the patient who indicates understanding and agrees with the plan and Code Status.  Patient requested to update his family on his own Disposition Plan: The appropriate patient status for this patient is INPATIENT. Inpatient status is judged to be reasonable and necessary in order to provide the required intensity of service to ensure the patient's safety. The patient's presenting symptoms, physical exam findings, and initial radiographic and laboratory data in the context of their chronic comorbidities is felt to place them at high risk for further clinical deterioration. Furthermore, it is not anticipated that the patient will be medically stable for discharge from the hospital within 2 midnights of admission. The following factors support the patient status of inpatient.   " The patient's presenting symptoms include fatigue. " The worrisome physical exam findings include respiratory distress, volume overload. " The initial radiographic and laboratory data are worrisome because of bacteremia, leukocytosis, lactic acidosis. " The chronic co-morbidities include severe aortic stenosis, HFpEF, prostate cancer.   * I certify that at the point of admission it is my clinical judgment that the patient will require inpatient hospital care spanning beyond 2 midnights from the point of admission due to high intensity of service, high risk for further deterioration  and high frequency of surveillance required.*   Status is: Inpatient  Remains inpatient appropriate because:IV treatments appropriate due to intensity of illness or inability to take PO and Inpatient level of care appropriate due to severity of illness   Dispo: The patient is from: Home              Anticipated d/c is to: Home              Anticipated d/c date is: > 3 days              Patient currently is not medically stable to d/c.        The medical decision making on this patient was of high complexity and the patient is at high risk for clinical deterioration, therefore this is a level 3  admission.  Consultants  . ID  Procedures  . Echo  Time Spent on Admission: 78 minutes    Harold Hedge, DO [facility] Pager [phone] 05/13/2020, 3:42 PM

## 2020-05-13 NOTE — ED Provider Notes (Signed)
[facility] DEPT Provider Note   CSN: [id number] Arrival date & time: 05/13/20  1100     History Chief Complaint  Patient presents with  . Abnormal Lab    Matthew Serrano. is a 74 y.o. male w/ hx of prostate cancer on oral hormone therapy (abiraterone acetate), presented emergency department with concern for positive blood culture.  The patient was contacted today after an earlier ED provider noted that the patient's blood cultures from 3 days ago were positive for Streptococcus.  PCR test from 05/11/20 shows:  Streptococcus species NOT DETECTED DETECTEDAbnormal   Comment: Not Enterococcus species, Streptococcus agalactiae, Streptococcus pyogenes, or Streptococcus pneumoniae.  CRITICAL RESULT CALLED TO, READ BACK BY AND VERIFIED WITH:  RN JAMIE B. [phone] [id number] FCP    Blood cultures show gram positive cocci "Too young to read."  The patient also had urine cultures showing 80K Klebsiella and 60K E Coli, and was started on bactrim yesterday for this.    The patient presents today complaining of fevers and subjective chills at home.  He also feels "a little more short of breath than usual".  He denies smoking or pulmonary history.  He is vaccinated for Covid.  He denies any nausea, vomiting, diarrhea, or abdominal pain.  He reports chronic b/l leg swelling.    HPI     Past Medical History:  Diagnosis Date  . AVM (arteriovenous malformation) of colon   . Cancer [facility])    prostate cancer  . DIABETES MELLITUS, TYPE II 07/06/2007  . ED (erectile dysfunction)   . Elevated PSA 01/20/2012  . Elevated PSA   . History of nuclear stress test    Myoview 8/18: EF 64, inferobasal thinning, no ischemia, low risk  . HYPERLIPIDEMIA 07/06/2007  . HYPERTENSION 07/06/2007  . HYPOTHYROIDISM 07/06/2007  . Leg pain    ABIs 8/18:  Normal   . Obesity   . Severe aortic stenosis     Patient Active Problem List   Diagnosis Date Noted  . Bacteremia 05/13/2020  .  Acute CHF (congestive heart failure) ([place]) 05/13/2020  . Respiratory distress 05/13/2020  . Acute blood loss anemia 05/02/2020  . Malignant neoplasm of prostate ([place]) 06/23/2018  . Goals of care, counseling/discussion 06/23/2018  . Aortic stenosis   . Left inguinal hernia 09/04/2015  . Alcoholism ([place]) 06/10/2014  . Anemia of chronic disease 05/23/2014  . Elevated PSA 01/20/2012  . HEMORRHAGE OF RECTUM AND ANUS 12/15/2008  . ERECTILE DYSFUNCTION, MILD 12/14/2007  . SEBORRHEIC KERATOSIS, INFLAMED 08/03/2007  . NEOPLASM, SKIN, UNCERTAIN BEHAVIOR 42/68/3419  . Hypothyroidism 07/06/2007  . Diabetes mellitus ([place]) 07/06/2007  . Hyperlipidemia 07/06/2007  . Essential hypertension 07/06/2007    Past Surgical History:  Procedure Laterality Date  . COLONOSCOPY    . COLONOSCOPY N/A 05/03/2020   Procedure: COLONOSCOPY;  Surgeon: Juanita Craver, MD;  Location: [facility] ENDOSCOPY;  Service: Endoscopy;  Laterality: N/A;  . COLONOSCOPY W/ BIOPSIES AND POLYPECTOMY    . ENTEROSCOPY N/A 05/02/2020   Procedure: ENTEROSCOPY;  Surgeon: Rush Landmark Telford Nab., MD;  Location: [facility];  Service: Gastroenterology;  Laterality: N/A;  . HEMOSTASIS CLIP PLACEMENT  05/03/2020   Procedure: HEMOSTASIS CLIP PLACEMENT;  Surgeon: Juanita Craver, MD;  Location: [facility];  Service: Endoscopy;;  . HOT HEMOSTASIS N/A 05/03/2020   Procedure: HOT HEMOSTASIS (ARGON PLASMA COAGULATION/BICAP);  Surgeon: Juanita Craver, MD;  Location: [facility];  Service: Endoscopy;  Laterality: N/A;  . IR IMAGING GUIDED PORT INSERTION  07/05/2018  . POLYPECTOMY    .  RIGHT/LEFT HEART CATH AND CORONARY ANGIOGRAPHY N/A 02/13/2020   Procedure: RIGHT/LEFT HEART CATH AND CORONARY ANGIOGRAPHY;  Surgeon: Belva Crome, MD;  Location: [facility];  Service: Cardiovascular;  Laterality: N/A;  . SUBMUCOSAL INJECTION  05/02/2020   Procedure: SUBMUCOSAL INJECTION;  Surgeon: Rush Landmark Telford Nab., MD;  Location: [facility];  Service:  Gastroenterology;;       Family History  Problem Relation Age of Onset  . Hypertension Other   . Breast cancer Sister   . Breast cancer Other        family history  . Breast cancer Brother        prostate ca  . Aneurysm Mother        Brain aneurysm   . Prostate cancer Father   . Colon cancer Neg Hx   . Esophageal cancer Neg Hx   . Stomach cancer Neg Hx   . Rectal cancer Neg Hx     Social History   Tobacco Use  . Smoking status: Former Research scientist (life sciences)  . Smokeless tobacco: Never Used  . Tobacco comment: quit 2005  Vaping Use  . Vaping Use: Never used  Substance Use Topics  . Alcohol use: Not Currently    Alcohol/week: 21.0 standard drinks    Types: 21 Cans of beer per week  . Drug use: No    Home Medications Prior to Admission medications   Medication Sig Start Date End Date Taking? Authorizing Provider  abiraterone acetate (ZYTIGA) 250 MG tablet TAKE 4 TABLETS (1,000 MG TOTAL) BY MOUTH DAILY. TAKE ON AN EMPTY STOMACH 1 HOUR BEFORE OR 2 HOURS AFTER A MEAL 04/16/20  Yes Wyatt Portela, MD  aspirin EC 81 MG tablet Take 81 mg by mouth daily.   Yes [provider]  atorvastatin (LIPITOR) 40 MG tablet TAKE 1 TABLET EVERY DAY Patient taking differently: Take 40 mg by mouth daily.  11/07/19  Yes [name], [name], NP  cholecalciferol (VITAMIN D3) 25 MCG (1000 UT) tablet Take 1,000 Units by mouth daily.   Yes [provider]  cloNIDine (CATAPRES) 0.2 MG tablet Take 1 tablet (0.2 mg total) by mouth 2 (two) times daily. 04/17/20 04/12/21 Yes Eileen Stanford, PA-C  doxazosin (CARDURA) 8 MG tablet TAKE 1 TABLET AT BEDTIME Patient taking differently: Take 8 mg by mouth at bedtime.  08/01/19  Yes [name], [name], NP  ferrous sulfate 325 (65 FE) MG EC tablet Take 1 tablet (325 mg total) by mouth 2 (two) times daily before a meal. 04/24/20  Yes Willia Craze, NP  fosinopril (MONOPRIL) 40 MG tablet Take 40 mg by mouth in the morning and at bedtime.  05/08/20  Yes [provider]  furosemide (LASIX) 40 MG tablet Take 40 mg by mouth 2 (two) times daily.  05/08/20  Yes [provider]  glipiZIDE (GLUCOTROL XL) 10 MG 24 hr tablet TAKE 1 TABLET TWICE DAILY Patient taking differently: Take 10 mg by mouth in the morning and at bedtime.  01/16/20  Yes [name], [name], NP  labetalol (NORMODYNE) 200 MG tablet TAKE 3 TABLETS EVERY MORNING  AND TAKE 2 TABLETS EVERY EVENING Patient taking differently: Take 400-600 mg by mouth See admin instructions. Takes 3 tablets in the morning and 2 tablets every evening. 11/07/19  Yes [name], [name], NP  levothyroxine (SYNTHROID) 150 MCG tablet TAKE 1 TABLET EVERY DAY Patient taking differently: Take 150 mcg by mouth daily before breakfast.  08/01/19  Yes [name], [name], NP  lidocaine-prilocaine (EMLA) cream Apply 1 application  topically as needed. Patient taking differently: Apply 1 application topically as needed (port).  06/23/18  Yes Wyatt Portela, MD  metFORMIN (GLUCOPHAGE) 1000 MG tablet TAKE 1 TABLET TWICE DAILY WITH MEALS Patient taking differently: Take 1,000 mg by mouth 2 (two) times daily with a meal.  04/02/20  Yes [name], [name], NP  NIFEdipine (PROCARDIA XL/NIFEDICAL XL) 60 MG 24 hr tablet TAKE 1 TABLET EVERY DAY Patient taking differently: Take 60 mg by mouth daily.  11/07/19  Yes [name], [name], NP  pioglitazone (ACTOS) 15 MG tablet TAKE 1 TABLET BY MOUTH EVERY DAY Patient taking differently: Take 15 mg by mouth daily.  05/12/20  Yes [name], [name], NP  potassium chloride SA (KLOR-CON) 20 MEQ tablet Take 1 tablet (20 mEq total) by mouth 2 (two) times daily. TAKE 1 TABLET TWICE DAILY Patient taking differently: Take 20 mEq by mouth 2 (two) times daily.  05/04/20  Yes Rai, Ripudeep K, MD  predniSONE (DELTASONE) 5 MG tablet TAKE 1 TABLET (5 MG TOTAL) BY MOUTH DAILY WITH BREAKFAST. 10/17/19  Yes Shadad, Mathis Dad, MD  furosemide (LASIX) 80 MG tablet Take 0.5 tablets (40 mg total) by mouth daily. Patient not taking:  Reported on 05/13/2020 05/12/20 05/12/21  Eileen Stanford, PA-C  omeprazole (PRILOSEC) 40 MG capsule TAKE 1 CAPSULE BY MOUTH EVERY DAY Patient taking differently: Take 40 mg by mouth daily. Patient don't remember taking this medication. 04/30/20   Willia Craze, NP  sulfamethoxazole-trimethoprim (BACTRIM DS) 800-160 MG tablet Take 1 tablet by mouth 2 (two) times daily. 05/12/20   Eileen Stanford, PA-C    Allergies    Patient has no known allergies.  Review of Systems   Review of Systems  Constitutional: Positive for appetite change, chills and fever.  HENT: Negative for ear pain and sore throat.   Eyes: Negative for pain and visual disturbance.  Respiratory: Positive for shortness of breath. Negative for cough.   Cardiovascular: Negative for chest pain and palpitations.  Gastrointestinal: Negative for abdominal pain and vomiting.  Genitourinary: Negative for dysuria and hematuria.  Musculoskeletal: Negative for arthralgias and myalgias.  Skin: Negative for color change and rash.  Neurological: Negative for syncope and headaches.  Psychiatric/Behavioral: Negative for agitation and confusion.  All other systems reviewed and are negative.   Physical Exam Updated Vital Signs BP (!) 111/59 (BP Location: Left Arm)   Pulse 90   Temp 98.1 F (36.7 C) (Oral)   Resp 18   Ht 5\' 7"  (1.702 m)   Wt 95.3 kg   SpO2 99%   BMI 32.89 kg/m   Physical Exam Vitals and nursing note reviewed.  Constitutional:      Appearance: He is well-developed.  HENT:     Head: Normocephalic and atraumatic.  Eyes:     Conjunctiva/sclera: Conjunctivae normal.  Cardiovascular:     Rate and Rhythm: Normal rate and regular rhythm.     Pulses: Normal pulses.  Pulmonary:     Effort: Pulmonary effort is normal. No respiratory distress.     Breath sounds: Normal breath sounds.  Abdominal:     General: There is no distension.     Palpations: Abdomen is soft.     Tenderness: There is no abdominal  tenderness. There is no guarding.  Musculoskeletal:        General: Swelling present.     Cervical back: Neck supple.  Skin:    General: Skin is warm and dry.  Neurological:     General: No focal deficit  present.     Mental Status: He is alert and oriented to person, place, and time.  Psychiatric:        Mood and Affect: Mood normal.        Behavior: Behavior normal.     ED Results / Procedures / Treatments   Labs (all labs ordered are listed, but only abnormal results are displayed) Labs Reviewed  LACTIC ACID, PLASMA - Abnormal; Notable for the following components:      Result Value   Lactic Acid, Venous 3.2 (*)    All other components within normal limits  LACTIC ACID, PLASMA - Abnormal; Notable for the following components:   Lactic Acid, Venous 2.1 (*)    All other components within normal limits  COMPREHENSIVE METABOLIC PANEL - Abnormal; Notable for the following components:   Sodium 133 (*)    CO2 16 (*)    Calcium 8.4 (*)    Total Protein 5.7 (*)    Albumin 2.3 (*)    AST 45 (*)    All other components within normal limits  CBC WITH DIFFERENTIAL/PLATELET - Abnormal; Notable for the following components:   WBC 13.6 (*)    RBC 2.83 (*)    Hemoglobin 7.7 (*)    HCT 24.0 (*)    RDW 16.5 (*)    Neutro Abs 11.3 (*)    Abs Immature Granulocytes 0.22 (*)    All other components within normal limits  APTT - Abnormal; Notable for the following components:   aPTT 43 (*)    All other components within normal limits  URINALYSIS, ROUTINE W REFLEX MICROSCOPIC - Abnormal; Notable for the following components:   APPearance HAZY (*)    Hgb urine dipstick SMALL (*)    All other components within normal limits  BRAIN NATRIURETIC PEPTIDE - Abnormal; Notable for the following components:   B Natriuretic Peptide 484.0 (*)    All other components within normal limits  SARS CORONAVIRUS 2 BY RT PCR (HOSPITAL ORDER, [facility])  CULTURE, BLOOD (ROUTINE X  2)  CULTURE, BLOOD (ROUTINE X 2)  URINE CULTURE  PROTIME-INR  MAGNESIUM    EKG EKG Interpretation  Date/Time:  Tuesday May 13 2020 12:11:23 EDT Ventricular Rate:  75 PR Interval:    QRS Duration: 90 QT Interval:  390 QTC Calculation: 436 R Axis:   -7 Text Interpretation: Sinus rhythm Low voltage, precordial leads Borderline T abnormalities, lateral leads Baseline wander in inferior leads, but no evident STEMI Confirmed by Octaviano Glow [phone]) on 05/13/2020 12:21:38 PM   Radiology DG Chest Port 1 View  Result Date: 05/13/2020 CLINICAL DATA:  Questionable sepsis. EXAM: PORTABLE CHEST 1 VIEW COMPARISON:  05/11/2020 FINDINGS: RIGHT-sided Port-A-Cath in stable position. Trachea midline.  Cardiomediastinal contours are stable. Mild central pulmonary vascular engorgement suggested with increasing opacities in the lung bases without signs of dense consolidative change. No definite effusion. On limited assessment skeletal structures without acute process. IMPRESSION: Mild central pulmonary vascular engorgement suggested with increasing opacities in the lung bases without signs of dense consolidative change. Findings could represent atelectasis or developing infection particularly at the RIGHT lung base. Electronically Signed   By: Zetta Bills M.D.   On: 05/13/2020 12:04    Procedures .Critical Care Performed by: Wyvonnia Dusky, MD Authorized by: Wyvonnia Dusky, MD   Critical care provider statement:    Critical care time (minutes):  45   Critical care was necessary to treat or prevent imminent or life-threatening deterioration  of the following conditions:  Sepsis   Critical care was time spent personally by me on the following activities:  Discussions with consultants, evaluation of patient's response to treatment, examination of patient, ordering and performing treatments and interventions, ordering and review of laboratory studies, ordering and review of radiographic  studies, pulse oximetry, re-evaluation of patient's condition, obtaining history from patient or surrogate and review of old charts   (including critical care time)  Medications Ordered in ED Medications  aspirin EC tablet 81 mg (has no administration in time range)  abiraterone acetate (ZYTIGA) tablet 1,000 mg (has no administration in time range)  atorvastatin (LIPITOR) tablet 40 mg (has no administration in time range)  doxazosin (CARDURA) tablet 8 mg (has no administration in time range)  cloNIDine (CATAPRES) tablet 0.2 mg (has no administration in time range)  levothyroxine (SYNTHROID) tablet 150 mcg (has no administration in time range)  predniSONE (DELTASONE) tablet 5 mg (has no administration in time range)  pantoprazole (PROTONIX) EC tablet 40 mg (has no administration in time range)  sodium chloride flush (NS) 0.9 % injection 3 mL (has no administration in time range)  cefTRIAXone (ROCEPHIN) 2 g in sodium chloride 0.9 % 100 mL IVPB (has no administration in time range)  metoprolol tartrate (LOPRESSOR) injection 5 mg (has no administration in time range)  lactated ringers bolus 2,000 mL (2,000 mLs Intravenous New Bag/Given 05/13/20 1217)  cefTRIAXone (ROCEPHIN) 2 g in sodium chloride 0.9 % 100 mL IVPB (2 g Intravenous New Bag/Given 05/13/20 1218)  azithromycin (ZITHROMAX) 500 mg in sodium chloride 0.9 % 250 mL IVPB (500 mg Intravenous New Bag/Given 05/13/20 1318)  furosemide (LASIX) injection 40 mg (40 mg Intravenous Given 05/13/20 1420)    ED Course  I have reviewed the triage vital signs and the nursing notes.  Pertinent labs & imaging results that were available during my care of the patient were reviewed by me and considered in my medical decision making (see chart for details).  74 year old male with history of prostate cancer on hormonal therapy presenting to the ED with positive blood cultures concerning for bacteremia, also reporting rigors and chills for the past 3 days.  He  was started on Bactrim for presumed UTI yesterday.  He is overall clinically well-appearing in the ED today.  However with these culture results, as well as immunosuppressed state, we will move to a sepsis work-up, including a 30 cc/kg LR fluid bolus based on his ideal body weight, ceftriaxone as coverage for Streptococcus as well as his urinary organisms, and repeat blood cultures.  I anticipate he will likely need admission.   Clinical Course as of May 13 1616  Tue May 13, 2020  1142 Sepsis fluid bolus ordered per patient's IDEAL BODY WEIGHT of 62 kg, approx 2L LR ordered   [MT]  1240 With possible PNA on xray, I've added azithromyicn to his regimen.   [MT]  1241 Hgb 7.7 close to baseline within past 2 weeks.   [MT]  1319 Lactic Acid, Venous(!!): 3.2 [MT]  1319 Receiving fluid bolus now   [MT]  1328 Placed order to page hospitalist for admission.   [MT]    Clinical Course User Index [MT] Eppie Barhorst, Carola Rhine, MD    Final Clinical Impression(s) / ED Diagnoses Final diagnoses:  Sepsis, due to unspecified organism, unspecified whether acute organ dysfunction present [facility])    Rx / DC Orders ED Discharge Orders    None       Shenoa Hattabaugh, Carola Rhine, MD 05/13/20  1617  

## 2020-05-13 NOTE — ED Triage Notes (Signed)
Patient reports sent for further evaluation of abnormal lab but unsure of lab. Upon chart review, patient called after positive blood cultures resulted. C/o leg weakness and ankle swelling x3 weeks. Denies pain.

## 2020-05-13 NOTE — Progress Notes (Signed)
  Echocardiogram 2D Echocardiogram has been performed.  Matilde Bash 05/13/2020, 3:38 PM

## 2020-05-13 NOTE — Progress Notes (Signed)
Report called to [facility]. Echo being performed bedside, will transfer when complete.

## 2020-05-13 NOTE — Plan of Care (Signed)
  Problem: Activity: Goal: Risk for activity intolerance will decrease Outcome: Progressing   Problem: Nutrition: Goal: Adequate nutrition will be maintained Outcome: Progressing   Problem: Coping: Goal: Level of anxiety will decrease Outcome: Progressing   Problem: Education: Goal: Knowledge of General Education information will improve Description: Including pain rating scale, medication(s)/side effects and non-pharmacologic comfort measures Outcome: Completed/Met   

## 2020-05-13 NOTE — Progress Notes (Signed)
PHARMACY NOTE:  ANTIMICROBIAL RENAL DOSAGE ADJUSTMENT  Current antimicrobial regimen includes a mismatch between antimicrobial dosage and estimated renal function.  As per policy approved by the Pharmacy & Therapeutics and Medical Executive Committees, the antimicrobial dosage will be adjusted accordingly.  Current antimicrobial dosage:  Ceftriaxone 1 gm IV X 1   Indication: Strep bacteremia  Renal Function:  Estimated Creatinine Clearance: 91.8 mL/min (by C-G formula based on SCr of 0.75 mg/dL). []      On intermittent HD, scheduled: []      On CRRT    Antimicrobial dosage has been changed to:  Ceftriaxone 2 gm IV X 1  Additional comments: Increasing for bacteremia    Thank you for allowing pharmacy to be a part of this patient's care.  Jimmy Footman, PharmD, BCPS, BCIDP Infectious Diseases Clinical Pharmacist Phone: [phone] 05/13/2020 12:13 PM

## 2020-05-13 NOTE — ED Notes (Signed)
Delay in transporting pt to 1418 due to echo being done at ED bedside.

## 2020-05-13 NOTE — Consult Note (Signed)
[facility]    Date of Admission:  05/13/2020   Total days of antibiotics: 0 ceftriaxone               Reason for Consult: strep bacteremia    Referring Provider: Neysa Bonito   Assessment: Streptococcal bacteremia Endocarditis of Ao +UCx DM2 CHF  Plan: 1. Continue ceftriaxone at current dose 2. Repeat BCx sent 3. Needs CVTS eval 4. Potential transfer to [place] 5. Await ID of his BCx 6. Await ID, sensi of his E coli 7. His Ceftriaxone should treat his UCx. His UA is not overly impressive for UTI.   Comment- Could posit that his GI bleed seeded his blood, leading to seeding of his heart valve which in turn lead to worsening SOB and LE edema.  Appreciate TRH, CV f/u.  Will alert ID team at [facility] of possible transfer.   Thank you so much for this interesting consult,  Principal Problem:   Bacteremia Active Problems:   Hypothyroidism   Diabetes mellitus ([place])   Hyperlipidemia   Essential hypertension   Aortic stenosis   Malignant neoplasm of prostate (HCC)   Acute CHF (congestive heart failure) (HCC)   Respiratory distress   . abiraterone acetate  1,000 mg Oral Daily  . [START ON 05/14/2020] aspirin EC  81 mg Oral Daily  . atorvastatin  40 mg Oral q1800  . [START ON 05/14/2020] cloNIDine  0.2 mg Oral BID  . doxazosin  8 mg Oral QHS  . [START ON 05/14/2020] influenza vaccine adjuvanted  0.5 mL Intramuscular Tomorrow-1000  . levothyroxine  150 mcg Oral Daily  . pantoprazole  40 mg Oral Daily  . [START ON 05/14/2020] pneumococcal 23 valent vaccine  0.5 mL Intramuscular Tomorrow-1000  . [START ON 05/14/2020] predniSONE  5 mg Oral Q breakfast  . sodium chloride flush  3 mL Intravenous Q12H    HPI: Matthew Serrano. is a 74 y.o. male with hx of aortic stenosis (under consideratin for TAVR), dm2, htn, who was seen 9-10 with acute GI bleed and found to have AVMs on EGD. He was d/c home.  He returned to ED on 9-18 with weakness, LE edema, flank  pain, and fever.  He was noted to have temp 100.6 and WBC 15 and h/h 8.2/25.0. He was d/c to home.  He was called 9-20 by CV team, was noted to have E coli and Klebsiella in his UCx and was given a rx for bactrim.  Today he was noted to have GPC (strep species) in his BCx and was called back to the hospital. He is afebrile today, WBC 13.6 and h/hh 7.7/24.0.  BNP 484. Lactate 2.1.   He underwent TTE today showing:  There is suspicion for a vegetation on both the left and the right  coronary cusps and an accompanying eccentric jet of aortic insufficiency  from the base of the right-left commisure. Cannot exclude a flail segment  of the right cusp. The aortic valve  is tricuspid. There is moderate calcification of the aortic valve. There  is moderate thickening of the aortic valve. Aortic valve regurgitation is  severe. Mild to moderate aortic valve stenosis.   Review of Systems: Review of Systems  Constitutional: Positive for chills and fever.  Respiratory: Positive for shortness of breath. Negative for cough.   Cardiovascular: Positive for leg swelling.  Gastrointestinal: Negative for abdominal pain, blood in stool, constipation, diarrhea and melena.  Genitourinary: Negative for  dysuria.  states his FSG have been < 150. He does not know exactly how long he has had DM. SOB improved with O2 Please see HPI. All other systems reviewed and negative.    Past Medical History:  Diagnosis Date  . AVM (arteriovenous malformation) of colon   . Cancer [facility])    prostate cancer  . DIABETES MELLITUS, TYPE II 07/06/2007  . ED (erectile dysfunction)   . Elevated PSA 01/20/2012  . Elevated PSA   . History of nuclear stress test    Myoview 8/18: EF 64, inferobasal thinning, no ischemia, low risk  . HYPERLIPIDEMIA 07/06/2007  . HYPERTENSION 07/06/2007  . HYPOTHYROIDISM 07/06/2007  . Leg pain    ABIs 8/18:  Normal   . Obesity   . Severe aortic stenosis     Social History   Tobacco Use  .  Smoking status: Former Research scientist (life sciences)  . Smokeless tobacco: Never Used  . Tobacco comment: quit 2005  Vaping Use  . Vaping Use: Never used  Substance Use Topics  . Alcohol use: Not Currently    Alcohol/week: 21.0 standard drinks    Types: 21 Cans of beer per week  . Drug use: No    Family History  Problem Relation Age of Onset  . Hypertension Other   . Breast cancer Sister   . Breast cancer Other        family history  . Breast cancer Brother        prostate ca  . Aneurysm Mother        Brain aneurysm   . Prostate cancer Father   . Colon cancer Neg Hx   . Esophageal cancer Neg Hx   . Stomach cancer Neg Hx   . Rectal cancer Neg Hx      Medications:  Scheduled: . abiraterone acetate  1,000 mg Oral Daily  . [START ON 05/14/2020] aspirin EC  81 mg Oral Daily  . atorvastatin  40 mg Oral q1800  . [START ON 05/14/2020] cloNIDine  0.2 mg Oral BID  . doxazosin  8 mg Oral QHS  . [START ON 05/14/2020] influenza vaccine adjuvanted  0.5 mL Intramuscular Tomorrow-1000  . levothyroxine  150 mcg Oral Daily  . pantoprazole  40 mg Oral Daily  . [START ON 05/14/2020] pneumococcal 23 valent vaccine  0.5 mL Intramuscular Tomorrow-1000  . [START ON 05/14/2020] predniSONE  5 mg Oral Q breakfast  . sodium chloride flush  3 mL Intravenous Q12H    Abtx:  Anti-infectives (From admission, onward)   Start     Dose/Rate Route Frequency Ordered Stop   05/14/20 1200  cefTRIAXone (ROCEPHIN) 2 g in sodium chloride 0.9 % 100 mL IVPB        2 g 200 mL/hr over 30 Minutes Intravenous Every 24 hours 05/13/20 1528     05/13/20 1245  azithromycin (ZITHROMAX) 500 mg in sodium chloride 0.9 % 250 mL IVPB        500 mg 250 mL/hr over 60 Minutes Intravenous  Once 05/13/20 1240 05/13/20 1418   05/13/20 1215  cefTRIAXone (ROCEPHIN) 2 g in sodium chloride 0.9 % 100 mL IVPB        2 g 200 mL/hr over 30 Minutes Intravenous  Once 05/13/20 1213 05/13/20 1248   05/13/20 1145  cefTRIAXone (ROCEPHIN) 1 g in sodium chloride 0.9  % 100 mL IVPB  Status:  Discontinued        1 g 200 mL/hr over 30 Minutes Intravenous  Once 05/13/20 1142 05/13/20  1213        OBJECTIVE: Blood pressure (!) 111/59, pulse 90, temperature 98.1 F (36.7 C), temperature source Oral, resp. rate 18, height 5\' 7"  (1.702 m), weight 95.3 kg, SpO2 99 %.  Physical Exam Vitals reviewed.  Constitutional:      General: He is not in acute distress.    Appearance: He is not ill-appearing, toxic-appearing or diaphoretic.  HENT:     Mouth/Throat:     Mouth: Mucous membranes are moist.     Pharynx: No oropharyngeal exudate.  Eyes:     Extraocular Movements: Extraocular movements intact.     Pupils: Pupils are equal, round, and reactive to light.  Cardiovascular:     Rate and Rhythm: Normal rate.     Heart sounds: Murmur heard.   Pulmonary:     Effort: Pulmonary effort is normal.     Breath sounds: Normal breath sounds.  Abdominal:     General: Bowel sounds are normal. There is no distension.     Palpations: Abdomen is soft.     Tenderness: There is no abdominal tenderness.  Musculoskeletal:     Cervical back: Normal range of motion and neck supple.     Right lower leg: Edema present.     Left lower leg: Edema present.       Legs:  Neurological:     General: No focal deficit present.     Mental Status: He is oriented to person, place, and time.     Comments: Normal light touch BLE  Psychiatric:        Mood and Affect: Mood normal.     Lab Results Results for orders placed or performed during the hospital encounter of 05/13/20 (from the past 48 hour(s))  Lactic acid, plasma     Status: Abnormal   Collection Time: 05/13/20 12:04 PM  Result Value Ref Range   Lactic Acid, Venous 3.2 (HH) 0.5 - 1.9 mmol/L    Comment: CRITICAL RESULT CALLED TO, READ BACK BY AND VERIFIED WITH: P.DOWD AT 1317 ON 05/13/20 BY N.THOMPSON Performed at [facility], [place] [address], [place], [place] [address]   Comprehensive metabolic  panel     Status: Abnormal   Collection Time: 05/13/20 12:04 PM  Result Value Ref Range   Sodium 133 (L) 135 - 145 mmol/L   Potassium 4.7 3.5 - 5.1 mmol/L   Chloride 103 98 - 111 mmol/L   CO2 16 (L) 22 - 32 mmol/L   Glucose, Bld 96 70 - 99 mg/dL    Comment: Glucose reference range applies only to samples taken after fasting for at least 8 hours.   BUN 18 8 - 23 mg/dL   Creatinine, Ser 1.05 0.61 - 1.24 mg/dL   Calcium 8.4 (L) 8.9 - 10.3 mg/dL   Total Protein 5.7 (L) 6.5 - 8.1 g/dL   Albumin 2.3 (L) 3.5 - 5.0 g/dL   AST 45 (H) 15 - 41 U/L   ALT 37 0 - 44 U/L   Alkaline Phosphatase 84 38 - 126 U/L   Total Bilirubin 0.7 0.3 - 1.2 mg/dL   GFR calc non Af Amer >60 >60 mL/min   GFR calc Af Amer >60 >60 mL/min   Anion gap 14 5 - 15    Comment: Performed at [facility], [place] [address], [place], [place] [address]  CBC WITH DIFFERENTIAL     Status: Abnormal   Collection Time: 05/13/20 12:04 PM  Result Value Ref Range  WBC 13.6 (H) 4.0 - 10.5 K/uL   RBC 2.83 (L) 4.22 - 5.81 MIL/uL   Hemoglobin 7.7 (L) 13.0 - 17.0 g/dL   HCT 24.0 (L) 39 - 52 %   MCV 84.8 80.0 - 100.0 fL   MCH 27.2 26.0 - 34.0 pg   MCHC 32.1 30.0 - 36.0 g/dL   RDW 16.5 (H) 11.5 - 15.5 %   Platelets 240 150 - 400 K/uL   nRBC 0.0 0.0 - 0.2 %   Neutrophils Relative % 83 %   Neutro Abs 11.3 (H) 1.7 - 7.7 K/uL   Lymphocytes Relative 9 %   Lymphs Abs 1.2 0.7 - 4.0 K/uL   Monocytes Relative 6 %   Monocytes Absolute 0.8 0 - 1 K/uL   Eosinophils Relative 0 %   Eosinophils Absolute 0.1 0 - 0 K/uL   Basophils Relative 0 %   Basophils Absolute 0.0 0 - 0 K/uL   Immature Granulocytes 2 %   Abs Immature Granulocytes 0.22 (H) 0.00 - 0.07 K/uL    Comment: Performed at [facility], [place] [address], [place], [place] [address]  Protime-INR     Status: None   Collection Time: 05/13/20 12:04 PM  Result Value Ref Range   Prothrombin Time 15.0 11.4 - 15.2 seconds   INR 1.2 0.8 - 1.2    Comment:  (NOTE) INR goal varies based on device and disease states. Performed at [facility], [place] [address], [place], [place] [address]   APTT     Status: Abnormal   Collection Time: 05/13/20 12:04 PM  Result Value Ref Range   aPTT 43 (H) 24 - 36 seconds    Comment:        IF BASELINE aPTT IS ELEVATED, SUGGEST PATIENT RISK ASSESSMENT BE USED TO DETERMINE APPROPRIATE ANTICOAGULANT THERAPY. Performed at [facility], [place] [address], [place], [place] [address]   Brain natriuretic peptide     Status: Abnormal   Collection Time: 05/13/20 12:04 PM  Result Value Ref Range   B Natriuretic Peptide 484.0 (H) 0.0 - 100.0 pg/mL    Comment: Performed at [facility], [place] [address], [place], [place] [address]  Magnesium     Status: None   Collection Time: 05/13/20 12:04 PM  Result Value Ref Range   Magnesium 1.8 1.7 - 2.4 mg/dL    Comment: Performed at [facility], [place] [address] [place], [place] [address]  SARS Coronavirus 2 by RT PCR (hospital order, performed in [facility] hospital lab) Nasopharyngeal Nasopharyngeal Swab     Status: None   Collection Time: 05/13/20 12:20 PM   Specimen: Nasopharyngeal Swab  Result Value Ref Range   SARS Coronavirus 2 NEGATIVE NEGATIVE    Comment: (NOTE) SARS-CoV-2 target nucleic acids are NOT DETECTED.  The SARS-CoV-2 RNA is generally detectable in upper and lower respiratory specimens during the acute phase of infection. The lowest concentration of SARS-CoV-2 viral copies this assay can detect is 250 copies / mL. A negative result does not preclude SARS-CoV-2 infection and should not be used as the sole basis for treatment or other patient management decisions.  A negative result may occur with improper specimen collection / handling, submission of specimen other than nasopharyngeal swab, presence of viral mutation(s) within the areas targeted by this assay, and inadequate number  of viral copies (<250 copies / mL). A negative result must be combined with clinical observations, patient history, and epidemiological information.  Fact  Sheet for Patients:   StrictlyIdeas.no  Fact Sheet for Healthcare Providers: BankingDealers.co.za  This test is not yet approved or  cleared by the [place] FDA and has been authorized for detection and/or diagnosis of SARS-CoV-2 by FDA under an Emergency Use Authorization (EUA).  This EUA will remain in effect (meaning this test can be used) for the duration of the COVID-19 declaration under Section 564(b)(1) of the Act, 21 U.S.C. section 360bbb-3(b)(1), unless the authorization is terminated or revoked sooner.  Performed at [facility], [place] [address], [place], [place] [address]   Urinalysis, Routine w reflex microscopic     Status: Abnormal   Collection Time: 05/13/20  1:10 PM  Result Value Ref Range   Color, Urine YELLOW YELLOW   APPearance HAZY (A) CLEAR   Specific Gravity, Urine 1.016 1.005 - 1.030   pH 5.0 5.0 - 8.0   Glucose, UA NEGATIVE NEGATIVE mg/dL   Hgb urine dipstick SMALL (A) NEGATIVE   Bilirubin Urine NEGATIVE NEGATIVE   Ketones, ur NEGATIVE NEGATIVE mg/dL   Protein, ur NEGATIVE NEGATIVE mg/dL   Nitrite NEGATIVE NEGATIVE   Leukocytes,Ua NEGATIVE NEGATIVE   RBC / HPF 0-5 0 - 5 RBC/hpf   WBC, UA 0-5 0 - 5 WBC/hpf   Bacteria, UA NONE SEEN NONE SEEN   Squamous Epithelial / LPF 0-5 0 - 5   Mucus PRESENT     Comment: Performed at [facility], [place] [address] [place], [place] [address]  Lactic acid, plasma     Status: Abnormal   Collection Time: 05/13/20  3:10 PM  Result Value Ref Range   Lactic Acid, Venous 2.1 (HH) 0.5 - 1.9 mmol/L    Comment: CRITICAL VALUE NOTED.  VALUE IS CONSISTENT WITH PREVIOUSLY REPORTED AND CALLED VALUE. Performed at [facility], [place] [address], [place], [place] [address]         Component Value Date/Time   SDES  05/11/2020 0114    IN/OUT CATH URINE Performed at [facility], [place] [address], [place], [place] [address]    SDES  05/11/2020 0114    BLOOD PORTA CATH Performed at [facility], [place] [address], [place], [place] [address]    SPECREQUEST  05/11/2020 0114    NONE Performed at [facility], [place] [address] [place], [place] [address]    [place]  05/11/2020 0114    BOTTLES DRAWN AEROBIC AND ANAEROBIC Blood Culture results may not be optimal due to an inadequate volume of blood received in culture bottles Performed at [facility], [place] [address], [place], [place] [address]    CULT (A) 05/11/2020 0114    80,000 COLONIES/mL KLEBSIELLA PNEUMONIAE 60,000 COLONIES/mL ESCHERICHIA COLI SUSCEPTIBILITIES TO FOLLOW Performed at [place] [address] [place], [place] [address]    CULT  05/11/2020 0114    GRAM POSITIVE COCCI TOO YOUNG TO READ Performed at [facility], [place] [address], [place], [place] [address]    REPTSTATUS PENDING 05/11/2020 0114   REPTSTATUS PENDING 05/11/2020 0114   DG Chest Port 1 View  Result Date: 05/13/2020 CLINICAL DATA:  Questionable sepsis. EXAM: PORTABLE CHEST 1 VIEW COMPARISON:  05/11/2020 FINDINGS: RIGHT-sided Port-A-Cath in stable position. Trachea midline.  Cardiomediastinal contours are stable. Mild central pulmonary vascular engorgement suggested with increasing opacities in the lung bases without signs of dense consolidative change. No definite effusion. On limited assessment skeletal structures without acute process. IMPRESSION: Mild central pulmonary  vascular engorgement suggested with increasing opacities in the lung bases without signs of dense consolidative change. Findings could represent atelectasis or developing infection particularly at the RIGHT lung base. Electronically Signed   By: Zetta Bills M.D.   On:  05/13/2020 12:04   Recent Results (from the past 240 hour(s))  Urine culture     Status: Abnormal (Preliminary result)   Collection Time: 05/11/20  1:14 AM   Specimen: In/Out Cath Urine  Result Value Ref Range Status   Specimen Description   Final    IN/OUT CATH URINE Performed at [facility], [place] [address] [place], [place] [address]    Special Requests   Final    NONE Performed at [facility], [place] [address], [place], [place] [address]    Culture (A)  Final    80,000 COLONIES/mL KLEBSIELLA PNEUMONIAE 60,000 COLONIES/mL ESCHERICHIA COLI SUSCEPTIBILITIES TO FOLLOW Performed at [facility], [place] [address] [place], [place] [address]    Report Status PENDING  Incomplete   Organism ID, Bacteria KLEBSIELLA PNEUMONIAE (A)  Final      Susceptibility   Klebsiella pneumoniae - MIC*    AMPICILLIN >=32 RESISTANT Resistant     CEFAZOLIN <=4 SENSITIVE Sensitive     CEFTRIAXONE <=0.25 SENSITIVE Sensitive     CIPROFLOXACIN <=0.25 SENSITIVE Sensitive     GENTAMICIN <=1 SENSITIVE Sensitive     IMIPENEM <=0.25 SENSITIVE Sensitive     NITROFURANTOIN 64 INTERMEDIATE Intermediate     TRIMETH/SULFA <=20 SENSITIVE Sensitive     AMPICILLIN/SULBACTAM 8 SENSITIVE Sensitive     PIP/TAZO <=4 SENSITIVE Sensitive     * 80,000 COLONIES/mL KLEBSIELLA PNEUMONIAE  Blood Culture (routine x 2)     Status: None (Preliminary result)   Collection Time: 05/11/20  1:14 AM   Specimen: BLOOD  Result Value Ref Range Status   Specimen Description   Final    BLOOD PORTA CATH Performed at [place] [address], [place], [place] [address]    Special Requests   Final    BOTTLES DRAWN AEROBIC AND ANAEROBIC Blood Culture results may not be optimal due to an inadequate volume of blood received in culture bottles Performed at [place] [address], [place], [place] [address]    Culture  Setup Time   Final    GRAM  POSITIVE COCCI IN BOTH AEROBIC AND ANAEROBIC BOTTLES CRITICAL RESULT CALLED TO, READ BACK BY AND VERIFIED WITH: RN Reginia Forts [phone] FCP    Culture   Final    GRAM POSITIVE COCCI TOO YOUNG TO READ Performed at [facility], [place] [address], [place], [place] [address]    Report Status PENDING  Incomplete  Blood Culture ID Panel (Reflexed)     Status: Abnormal   Collection Time: 05/11/20  1:14 AM  Result Value Ref Range Status   Enterococcus faecalis NOT DETECTED NOT DETECTED Final   Enterococcus Faecium NOT DETECTED NOT DETECTED Final   Listeria monocytogenes NOT DETECTED NOT DETECTED Final   Staphylococcus species NOT DETECTED NOT DETECTED Final   Staphylococcus aureus (BCID) NOT DETECTED NOT DETECTED Final   Staphylococcus epidermidis NOT DETECTED NOT DETECTED Final   Staphylococcus lugdunensis NOT DETECTED NOT DETECTED Final   Streptococcus species DETECTED (A) NOT DETECTED Final    Comment: Not Enterococcus species, Streptococcus agalactiae, Streptococcus pyogenes, or Streptococcus pneumoniae. CRITICAL RESULT CALLED TO, READ BACK BY AND VERIFIED WITH: RN JAMIE B. [id number] FCP  Streptococcus agalactiae NOT DETECTED NOT DETECTED Final   Streptococcus pneumoniae NOT DETECTED NOT DETECTED Final   Streptococcus pyogenes NOT DETECTED NOT DETECTED Final   A.calcoaceticus-baumannii NOT DETECTED NOT DETECTED Final   Bacteroides fragilis NOT DETECTED NOT DETECTED Final   Enterobacterales NOT DETECTED NOT DETECTED Final   Enterobacter cloacae complex NOT DETECTED NOT DETECTED Final   Escherichia coli NOT DETECTED NOT DETECTED Final   Klebsiella aerogenes NOT DETECTED NOT DETECTED Final   Klebsiella oxytoca NOT DETECTED NOT DETECTED Final   Klebsiella pneumoniae NOT DETECTED NOT DETECTED Final   Proteus species NOT DETECTED NOT DETECTED Final   Salmonella species NOT DETECTED NOT DETECTED Final   Serratia marcescens NOT DETECTED NOT DETECTED Final   Haemophilus influenzae  NOT DETECTED NOT DETECTED Final   Neisseria meningitidis NOT DETECTED NOT DETECTED Final   Pseudomonas aeruginosa NOT DETECTED NOT DETECTED Final   Stenotrophomonas maltophilia NOT DETECTED NOT DETECTED Final   Candida albicans NOT DETECTED NOT DETECTED Final   Candida auris NOT DETECTED NOT DETECTED Final   Candida glabrata NOT DETECTED NOT DETECTED Final   Candida krusei NOT DETECTED NOT DETECTED Final   Candida parapsilosis NOT DETECTED NOT DETECTED Final   Candida tropicalis NOT DETECTED NOT DETECTED Final   Cryptococcus neoformans/gattii NOT DETECTED NOT DETECTED Final    Comment: Performed at [facility], [address], [place], [place] [address]  SARS Coronavirus 2 by RT PCR (hospital order, performed in [facility] hospital lab) Nasopharyngeal Nasopharyngeal Swab     Status: None   Collection Time: 05/11/20  1:15 AM   Specimen: Nasopharyngeal Swab  Result Value Ref Range Status   SARS Coronavirus 2 NEGATIVE NEGATIVE Final    Comment: (NOTE) SARS-CoV-2 target nucleic acids are NOT DETECTED.  The SARS-CoV-2 RNA is generally detectable in upper and lower respiratory specimens during the acute phase of infection. The lowest concentration of SARS-CoV-2 viral copies this assay can detect is 250 copies / mL. A negative result does not preclude SARS-CoV-2 infection and should not be used as the sole basis for treatment or other patient management decisions.  A negative result may occur with improper specimen collection / handling, submission of specimen other than nasopharyngeal swab, presence of viral mutation(s) within the areas targeted by this assay, and inadequate number of viral copies (<250 copies / mL). A negative result must be combined with clinical observations, patient history, and epidemiological information.  Fact Sheet for Patients:   StrictlyIdeas.no  Fact Sheet for Healthcare  Providers: BankingDealers.co.za  This test is not yet approved or  cleared by the [place] FDA and has been authorized for detection and/or diagnosis of SARS-CoV-2 by FDA under an Emergency Use Authorization (EUA).  This EUA will remain in effect (meaning this test can be used) for the duration of the COVID-19 declaration under Section 564(b)(1) of the Act, 21 U.S.C. section 360bbb-3(b)(1), unless the authorization is terminated or revoked sooner.  Performed at [facility], [place] [address], [place], [place] [address]   SARS Coronavirus 2 by RT PCR (hospital order, performed in [facility] hospital lab) Nasopharyngeal Nasopharyngeal Swab     Status: None   Collection Time: 05/13/20 12:20 PM   Specimen: Nasopharyngeal Swab  Result Value Ref Range Status   SARS Coronavirus 2 NEGATIVE NEGATIVE Final    Comment: (NOTE) SARS-CoV-2 target nucleic acids are NOT DETECTED.  The SARS-CoV-2 RNA is generally detectable in upper and lower respiratory specimens during the acute phase of infection. The  lowest concentration of SARS-CoV-2 viral copies this assay can detect is 250 copies / mL. A negative result does not preclude SARS-CoV-2 infection and should not be used as the sole basis for treatment or other patient management decisions.  A negative result may occur with improper specimen collection / handling, submission of specimen other than nasopharyngeal swab, presence of viral mutation(s) within the areas targeted by this assay, and inadequate number of viral copies (<250 copies / mL). A negative result must be combined with clinical observations, patient history, and epidemiological information.  Fact Sheet for Patients:   StrictlyIdeas.no  Fact Sheet for Healthcare Providers: BankingDealers.co.za  This test is not yet approved or  cleared by the [place] FDA and has been authorized for  detection and/or diagnosis of SARS-CoV-2 by FDA under an Emergency Use Authorization (EUA).  This EUA will remain in effect (meaning this test can be used) for the duration of the COVID-19 declaration under Section 564(b)(1) of the Act, 21 U.S.C. section 360bbb-3(b)(1), unless the authorization is terminated or revoked sooner.  Performed at [facility], [place] [address], [place], [place] [address]     Microbiology: Recent Results (from the past 240 hour(s))  Urine culture     Status: Abnormal (Preliminary result)   Collection Time: 05/11/20  1:14 AM   Specimen: In/Out Cath Urine  Result Value Ref Range Status   Specimen Description   Final    IN/OUT CATH URINE Performed at [facility], [place] [address], [place], [place] [address]    Special Requests   Final    NONE Performed at [facility], [place] [address], [place], [place] [address]    Culture (A)  Final    80,000 COLONIES/mL KLEBSIELLA PNEUMONIAE 60,000 COLONIES/mL ESCHERICHIA COLI SUSCEPTIBILITIES TO FOLLOW Performed at [facility], [place] [address], [place], [place] [address]    Report Status PENDING  Incomplete   Organism ID, Bacteria KLEBSIELLA PNEUMONIAE (A)  Final      Susceptibility   Klebsiella pneumoniae - MIC*    AMPICILLIN >=32 RESISTANT Resistant     CEFAZOLIN <=4 SENSITIVE Sensitive     CEFTRIAXONE <=0.25 SENSITIVE Sensitive     CIPROFLOXACIN <=0.25 SENSITIVE Sensitive     GENTAMICIN <=1 SENSITIVE Sensitive     IMIPENEM <=0.25 SENSITIVE Sensitive     NITROFURANTOIN 64 INTERMEDIATE Intermediate     TRIMETH/SULFA <=20 SENSITIVE Sensitive     AMPICILLIN/SULBACTAM 8 SENSITIVE Sensitive     PIP/TAZO <=4 SENSITIVE Sensitive     * 80,000 COLONIES/mL KLEBSIELLA PNEUMONIAE  Blood Culture (routine x 2)     Status: None (Preliminary result)   Collection Time: 05/11/20  1:14 AM   Specimen: BLOOD  Result Value Ref Range Status   Specimen Description    Final    BLOOD PORTA CATH Performed at [facility] [address] [place], [place] [address]    Special Requests   Final    BOTTLES DRAWN AEROBIC AND ANAEROBIC Blood Culture results may not be optimal due to an inadequate volume of blood received in culture bottles Performed at [facility] [address], [place], [place] [address]    Culture  Setup Time   Final    GRAM POSITIVE COCCI IN BOTH AEROBIC AND ANAEROBIC BOTTLES CRITICAL RESULT CALLED TO, READ BACK BY AND VERIFIED WITH: RN JAMIE B. [id number] [phone] FCP    Culture   Final    GRAM POSITIVE COCCI TOO YOUNG TO  READ Performed at [facility], [place] [address] [place], [place] [address]    Report Status PENDING  Incomplete  Blood Culture ID Panel (Reflexed)     Status: Abnormal   Collection Time: 05/11/20  1:14 AM  Result Value Ref Range Status   Enterococcus faecalis NOT DETECTED NOT DETECTED Final   Enterococcus Faecium NOT DETECTED NOT DETECTED Final   Listeria monocytogenes NOT DETECTED NOT DETECTED Final   Staphylococcus species NOT DETECTED NOT DETECTED Final   Staphylococcus aureus (BCID) NOT DETECTED NOT DETECTED Final   Staphylococcus epidermidis NOT DETECTED NOT DETECTED Final   Staphylococcus lugdunensis NOT DETECTED NOT DETECTED Final   Streptococcus species DETECTED (A) NOT DETECTED Final    Comment: Not Enterococcus species, Streptococcus agalactiae, Streptococcus pyogenes, or Streptococcus pneumoniae. CRITICAL RESULT CALLED TO, READ BACK BY AND VERIFIED WITH: RN JAMIE B. [phone] [id number] FCP    Streptococcus agalactiae NOT DETECTED NOT DETECTED Final   Streptococcus pneumoniae NOT DETECTED NOT DETECTED Final   Streptococcus pyogenes NOT DETECTED NOT DETECTED Final   A.calcoaceticus-baumannii NOT DETECTED NOT DETECTED Final   Bacteroides fragilis NOT DETECTED NOT DETECTED Final   Enterobacterales NOT DETECTED NOT DETECTED Final   Enterobacter cloacae complex NOT DETECTED  NOT DETECTED Final   Escherichia coli NOT DETECTED NOT DETECTED Final   Klebsiella aerogenes NOT DETECTED NOT DETECTED Final   Klebsiella oxytoca NOT DETECTED NOT DETECTED Final   Klebsiella pneumoniae NOT DETECTED NOT DETECTED Final   Proteus species NOT DETECTED NOT DETECTED Final   Salmonella species NOT DETECTED NOT DETECTED Final   Serratia marcescens NOT DETECTED NOT DETECTED Final   Haemophilus influenzae NOT DETECTED NOT DETECTED Final   Neisseria meningitidis NOT DETECTED NOT DETECTED Final   Pseudomonas aeruginosa NOT DETECTED NOT DETECTED Final   Stenotrophomonas maltophilia NOT DETECTED NOT DETECTED Final   Candida albicans NOT DETECTED NOT DETECTED Final   Candida auris NOT DETECTED NOT DETECTED Final   Candida glabrata NOT DETECTED NOT DETECTED Final   Candida krusei NOT DETECTED NOT DETECTED Final   Candida parapsilosis NOT DETECTED NOT DETECTED Final   Candida tropicalis NOT DETECTED NOT DETECTED Final   Cryptococcus neoformans/gattii NOT DETECTED NOT DETECTED Final    Comment: Performed at [facility], [address] [place], [place] [address]  SARS Coronavirus 2 by RT PCR (hospital order, performed in [facility] hospital lab) Nasopharyngeal Nasopharyngeal Swab     Status: None   Collection Time: 05/11/20  1:15 AM   Specimen: Nasopharyngeal Swab  Result Value Ref Range Status   SARS Coronavirus 2 NEGATIVE NEGATIVE Final    Comment: (NOTE) SARS-CoV-2 target nucleic acids are NOT DETECTED.  The SARS-CoV-2 RNA is generally detectable in upper and lower respiratory specimens during the acute phase of infection. The lowest concentration of SARS-CoV-2 viral copies this assay can detect is 250 copies / mL. A negative result does not preclude SARS-CoV-2 infection and should not be used as the sole basis for treatment or other patient management decisions.  A negative result may occur with improper specimen collection / handling, submission of specimen other than  nasopharyngeal swab, presence of viral mutation(s) within the areas targeted by this assay, and inadequate number of viral copies (<250 copies / mL). A negative result must be combined with clinical observations, patient history, and epidemiological information.  Fact Sheet for Patients:   StrictlyIdeas.no  Fact Sheet for Healthcare Providers: BankingDealers.co.za  This test is not yet approved or  cleared by the [place]  FDA and has been authorized for detection and/or diagnosis of SARS-CoV-2 by FDA under an Emergency Use Authorization (EUA).  This EUA will remain in effect (meaning this test can be used) for the duration of the COVID-19 declaration under Section 564(b)(1) of the Act, 21 U.S.C. section 360bbb-3(b)(1), unless the authorization is terminated or revoked sooner.  Performed at [facility], [place] [address], [place], [place] [address]   SARS Coronavirus 2 by RT PCR (hospital order, performed in [facility] hospital lab) Nasopharyngeal Nasopharyngeal Swab     Status: None   Collection Time: 05/13/20 12:20 PM   Specimen: Nasopharyngeal Swab  Result Value Ref Range Status   SARS Coronavirus 2 NEGATIVE NEGATIVE Final    Comment: (NOTE) SARS-CoV-2 target nucleic acids are NOT DETECTED.  The SARS-CoV-2 RNA is generally detectable in upper and lower respiratory specimens during the acute phase of infection. The lowest concentration of SARS-CoV-2 viral copies this assay can detect is 250 copies / mL. A negative result does not preclude SARS-CoV-2 infection and should not be used as the sole basis for treatment or other patient management decisions.  A negative result may occur with improper specimen collection / handling, submission of specimen other than nasopharyngeal swab, presence of viral mutation(s) within the areas targeted by this assay, and inadequate number of viral copies (<250 copies / mL). A  negative result must be combined with clinical observations, patient history, and epidemiological information.  Fact Sheet for Patients:   StrictlyIdeas.no  Fact Sheet for Healthcare Providers: BankingDealers.co.za  This test is not yet approved or  cleared by the [place] FDA and has been authorized for detection and/or diagnosis of SARS-CoV-2 by FDA under an Emergency Use Authorization (EUA).  This EUA will remain in effect (meaning this test can be used) for the duration of the COVID-19 declaration under Section 564(b)(1) of the Act, 21 U.S.C. section 360bbb-3(b)(1), unless the authorization is terminated or revoked sooner.  Performed at [facility], [place] [address], [place], [place] [address]     Radiographs and labs were personally reviewed by me.   Bobby Rumpf, MD [facility] [phone] 05/13/2020, 4:44 PM

## 2020-05-13 NOTE — Telephone Encounter (Signed)
Patient has positive blood cultures, Tomi Bamberger MD made aware and advises that the patient return to the hospital for follow up and possible admission. This RN called patient and made him aware, pt agrees and reports he will return to the hospital, no further questions at this time.

## 2020-05-14 ENCOUNTER — Inpatient Hospital Stay (HOSPITAL_COMMUNITY): Payer: Medicare Other | Admitting: Certified Registered"

## 2020-05-14 ENCOUNTER — Inpatient Hospital Stay (HOSPITAL_COMMUNITY)
Admit: 2020-05-14 | Discharge: 2020-05-14 | Disposition: A | Discharge status: Home / Self Care | Payer: Medicare Other | Attending: Cardiovascular Disease | Admitting: Cardiovascular Disease

## 2020-05-14 ENCOUNTER — Encounter (HOSPITAL_COMMUNITY)
Admission: EM | Disposition: A | Discharge status: Skilled Nursing Facility | Payer: Self-pay | Source: Home / Self Care | Attending: Thoracic Surgery (Cardiothoracic Vascular Surgery)

## 2020-05-14 ENCOUNTER — Encounter (HOSPITAL_COMMUNITY): Payer: Self-pay | Admitting: Internal Medicine

## 2020-05-14 ENCOUNTER — Inpatient Hospital Stay (HOSPITAL_COMMUNITY): Payer: Medicare Other

## 2020-05-14 DIAGNOSIS — I38 Endocarditis, valve unspecified: Secondary | ICD-10-CM | POA: Insufficient documentation

## 2020-05-14 DIAGNOSIS — I313 Pericardial effusion (noninflammatory): Secondary | ICD-10-CM

## 2020-05-14 DIAGNOSIS — I339 Acute and subacute endocarditis, unspecified: Secondary | ICD-10-CM

## 2020-05-14 DIAGNOSIS — D649 Anemia, unspecified: Secondary | ICD-10-CM | POA: Diagnosis present

## 2020-05-14 DIAGNOSIS — Z8719 Personal history of other diseases of the digestive system: Secondary | ICD-10-CM

## 2020-05-14 DIAGNOSIS — I772 Rupture of artery: Secondary | ICD-10-CM

## 2020-05-14 DIAGNOSIS — I330 Acute and subacute infective endocarditis: Secondary | ICD-10-CM | POA: Diagnosis present

## 2020-05-14 DIAGNOSIS — I350 Nonrheumatic aortic (valve) stenosis: Secondary | ICD-10-CM

## 2020-05-14 HISTORY — PX: TEE WITHOUT CARDIOVERSION: SHX5443

## 2020-05-14 HISTORY — DX: Rupture of artery: I77.2

## 2020-05-14 LAB — URINALYSIS, COMPLETE (UACMP) WITH MICROSCOPIC
Bacteria, UA: NONE SEEN
Bilirubin Urine: NEGATIVE
Glucose, UA: NEGATIVE mg/dL
Ketones, ur: NEGATIVE mg/dL
Leukocytes,Ua: NEGATIVE
Nitrite: NEGATIVE
Protein, ur: NEGATIVE mg/dL
Specific Gravity, Urine: 1.006 (ref 1.005–1.030)
pH: 5 (ref 5.0–8.0)

## 2020-05-14 LAB — COMPREHENSIVE METABOLIC PANEL
ALT: 30 U/L (ref 0–44)
AST: 28 U/L (ref 15–41)
Albumin: 1.9 g/dL — ABNORMAL LOW (ref 3.5–5.0)
Alkaline Phosphatase: 78 U/L (ref 38–126)
Anion gap: 12 (ref 5–15)
BUN: 10 mg/dL (ref 8–23)
CO2: 19 mmol/L — ABNORMAL LOW (ref 22–32)
Calcium: 8 mg/dL — ABNORMAL LOW (ref 8.9–10.3)
Chloride: 109 mmol/L (ref 98–111)
Creatinine, Ser: 0.93 mg/dL (ref 0.61–1.24)
GFR calc Af Amer: >60 mL/min (ref >60)
GFR calc non Af Amer: >60 mL/min (ref >60)
Glucose, Bld: 99 mg/dL (ref 70–99)
Potassium: 3.4 mmol/L — ABNORMAL LOW (ref 3.5–5.1)
Sodium: 140 mmol/L (ref 135–145)
Total Bilirubin: 1 mg/dL (ref 0.3–1.2)
Total Protein: 5.1 g/dL — ABNORMAL LOW (ref 6.5–8.1)

## 2020-05-14 LAB — CBC
HCT: 24 % — ABNORMAL LOW (ref 39.0–52.0)
Hemoglobin: 7.7 g/dL — ABNORMAL LOW (ref 13.0–17.0)
MCH: 26.8 pg (ref 26.0–34.0)
MCHC: 32.1 g/dL (ref 30.0–36.0)
MCV: 83.6 fL (ref 80.0–100.0)
Platelets: 245 10*3/uL (ref 150–400)
RBC: 2.87 MIL/uL — ABNORMAL LOW (ref 4.22–5.81)
RDW: 16.5 % — ABNORMAL HIGH (ref 11.5–15.5)
WBC: 12.2 10*3/uL — ABNORMAL HIGH (ref 4.0–10.5)
nRBC: 0 % (ref 0.0–0.2)

## 2020-05-14 LAB — URINE CULTURE
Culture: 80000 — AB
Culture: NO GROWTH

## 2020-05-14 LAB — GLUCOSE, CAPILLARY
Glucose-Capillary: 120 mg/dL — ABNORMAL HIGH (ref 70–99)
Glucose-Capillary: 42 mg/dL — CL (ref 70–99)
Glucose-Capillary: 59 mg/dL — ABNORMAL LOW (ref 70–99)
Glucose-Capillary: 83 mg/dL (ref 70–99)
Glucose-Capillary: 92 mg/dL (ref 70–99)
Glucose-Capillary: 92 mg/dL (ref 70–99)
Glucose-Capillary: 93 mg/dL (ref 70–99)

## 2020-05-14 LAB — BASIC METABOLIC PANEL
Anion gap: 11 (ref 5–15)
BUN: 16 mg/dL (ref 8–23)
CO2: 20 mmol/L — ABNORMAL LOW (ref 22–32)
Calcium: 7.7 mg/dL — ABNORMAL LOW (ref 8.9–10.3)
Chloride: 104 mmol/L (ref 98–111)
Creatinine, Ser: 0.92 mg/dL (ref 0.61–1.24)
GFR calc Af Amer: >60 mL/min (ref >60)
GFR calc non Af Amer: >60 mL/min (ref >60)
Glucose, Bld: 74 mg/dL (ref 70–99)
Potassium: 3.4 mmol/L — ABNORMAL LOW (ref 3.5–5.1)
Sodium: 135 mmol/L (ref 135–145)

## 2020-05-14 LAB — PROTIME-INR
INR: 1.2 (ref 0.8–1.2)
INR: 1.3 — ABNORMAL HIGH (ref 0.8–1.2)
Prothrombin Time: 14.9 seconds (ref 11.4–15.2)
Prothrombin Time: 15.4 seconds — ABNORMAL HIGH (ref 11.4–15.2)

## 2020-05-14 LAB — ECHO TEE: P 1/2 time: 142 msec

## 2020-05-14 LAB — MRSA PCR SCREENING: MRSA by PCR: NEGATIVE

## 2020-05-14 LAB — PROCALCITONIN: Procalcitonin: 0.25 ng/mL

## 2020-05-14 LAB — APTT: aPTT: 36 seconds (ref 24–36)

## 2020-05-14 SURGERY — ECHOCARDIOGRAM, TRANSESOPHAGEAL
Anesthesia: Monitor Anesthesia Care

## 2020-05-14 MED ORDER — PHENYLEPHRINE HCL-NACL 10-0.9 MG/250ML-% IV SOLN
INTRAVENOUS | Status: DC | PRN
  Administered 2020-05-14: 30 ug/min via INTRAVENOUS

## 2020-05-14 MED ORDER — CHLORHEXIDINE GLUCONATE 0.12 % MT SOLN
15.0000 mL | Freq: Once | OROMUCOSAL | Status: AC
  Administered 2020-05-15: 15 mL via OROMUCOSAL
  Filled 2020-05-14: qty 15

## 2020-05-14 MED ORDER — TRANEXAMIC ACID (OHS) BOLUS VIA INFUSION
15.0000 mg/kg | INTRAVENOUS | Status: AC
  Administered 2020-05-15: 1474.5 mg via INTRAVENOUS
  Filled 2020-05-14: qty 1475

## 2020-05-14 MED ORDER — DEXTROSE 50 % IV SOLN
INTRAVENOUS | Status: AC
  Administered 2020-05-14: 25 g via INTRAVENOUS
  Filled 2020-05-14: qty 50

## 2020-05-14 MED ORDER — TRANEXAMIC ACID 1000 MG/10ML IV SOLN
1.5000 mg/kg/h | INTRAVENOUS | Status: AC
  Administered 2020-05-15 (×2): 1.5 mg/kg/h via INTRAVENOUS
  Filled 2020-05-14: qty 25

## 2020-05-14 MED ORDER — WHITE PETROLATUM EX OINT
TOPICAL_OINTMENT | CUTANEOUS | Status: AC
  Administered 2020-05-14: 0.2
  Filled 2020-05-14: qty 28.35

## 2020-05-14 MED ORDER — EPINEPHRINE HCL 5 MG/250ML IV SOLN IN NS
0.0000 ug/min | INTRAVENOUS | Status: DC
  Filled 2020-05-14: qty 250

## 2020-05-14 MED ORDER — PROPOFOL 500 MG/50ML IV EMUL
INTRAVENOUS | Status: DC | PRN
  Administered 2020-05-14: 80 ug/kg/min via INTRAVENOUS

## 2020-05-14 MED ORDER — VANCOMYCIN HCL 1500 MG/300ML IV SOLN
1500.0000 mg | INTRAVENOUS | Status: AC
  Administered 2020-05-15: 1500 mg via INTRAVENOUS
  Filled 2020-05-14: qty 300

## 2020-05-14 MED ORDER — NITROGLYCERIN IN D5W 200-5 MCG/ML-% IV SOLN
2.0000 ug/min | INTRAVENOUS | Status: AC
  Administered 2020-05-15: 16.6 ug/min via INTRAVENOUS
  Filled 2020-05-14: qty 250

## 2020-05-14 MED ORDER — CHLORHEXIDINE GLUCONATE CLOTH 2 % EX PADS
6.0000 | MEDICATED_PAD | Freq: Every day | CUTANEOUS | Status: DC
  Administered 2020-05-14: 6 via TOPICAL

## 2020-05-14 MED ORDER — EPHEDRINE SULFATE 50 MG/ML IJ SOLN
INTRAMUSCULAR | Status: DC | PRN
  Administered 2020-05-14 (×2): 5 mg via INTRAVENOUS

## 2020-05-14 MED ORDER — LIDOCAINE 2% (20 MG/ML) 5 ML SYRINGE
INTRAMUSCULAR | Status: DC | PRN
  Administered 2020-05-14: 40 mg via INTRAVENOUS

## 2020-05-14 MED ORDER — SODIUM CHLORIDE 0.9 % IV SOLN
INTRAVENOUS | Status: DC
  Filled 2020-05-14: qty 30

## 2020-05-14 MED ORDER — FUROSEMIDE 10 MG/ML IJ SOLN
40.0000 mg | Freq: Two times a day (BID) | INTRAMUSCULAR | Status: DC
  Administered 2020-05-14 – 2020-05-15 (×2): 40 mg via INTRAVENOUS
  Filled 2020-05-14 (×2): qty 4

## 2020-05-14 MED ORDER — VANCOMYCIN HCL 1000 MG IV SOLR
INTRAVENOUS | Status: DC
  Filled 2020-05-14: qty 1000

## 2020-05-14 MED ORDER — INSULIN REGULAR(HUMAN) IN NACL 100-0.9 UT/100ML-% IV SOLN
INTRAVENOUS | Status: AC
  Administered 2020-05-15: 2.4 [IU]/h via INTRAVENOUS
  Filled 2020-05-14: qty 100

## 2020-05-14 MED ORDER — DEXTROSE 50 % IV SOLN: 25.0000 g | INTRAVENOUS | Status: AC

## 2020-05-14 MED ORDER — NOREPINEPHRINE 4 MG/250ML-% IV SOLN
0.0000 ug/min | INTRAVENOUS | Status: DC
  Filled 2020-05-14: qty 250

## 2020-05-14 MED ORDER — POTASSIUM CHLORIDE 2 MEQ/ML IV SOLN
80.0000 meq | INTRAVENOUS | Status: DC
  Filled 2020-05-14: qty 40

## 2020-05-14 MED ORDER — SODIUM CHLORIDE 0.9 % IV SOLN: INTRAVENOUS | Status: DC

## 2020-05-14 MED ORDER — CHLORHEXIDINE GLUCONATE CLOTH 2 % EX PADS
6.0000 | MEDICATED_PAD | Freq: Once | CUTANEOUS | Status: AC
  Administered 2020-05-14: 6 via TOPICAL

## 2020-05-14 MED ORDER — TRANEXAMIC ACID (OHS) PUMP PRIME SOLUTION
2.0000 mg/kg | INTRAVENOUS | Status: DC
  Filled 2020-05-14: qty 1.97

## 2020-05-14 MED ORDER — SODIUM CHLORIDE 0.9 % IV SOLN
1.5000 g | INTRAVENOUS | Status: AC
  Administered 2020-05-15: 1.5 g via INTRAVENOUS
  Filled 2020-05-14: qty 1.5

## 2020-05-14 MED ORDER — DEXTROSE 50 % IV SOLN
12.5000 g | Freq: Once | INTRAVENOUS | Status: AC
  Administered 2020-05-14: 12.5 g via INTRAVENOUS
  Filled 2020-05-14: qty 50

## 2020-05-14 MED ORDER — POTASSIUM CHLORIDE CRYS ER 20 MEQ PO TBCR
40.0000 meq | EXTENDED_RELEASE_TABLET | Freq: Once | ORAL | Status: AC
  Administered 2020-05-14: 40 meq via ORAL
  Filled 2020-05-14: qty 2

## 2020-05-14 MED ORDER — PROPOFOL 10 MG/ML IV BOLUS
INTRAVENOUS | Status: DC | PRN
  Administered 2020-05-14 (×2): 20 mg via INTRAVENOUS
  Administered 2020-05-14: 50 mg via INTRAVENOUS

## 2020-05-14 MED ORDER — SODIUM CHLORIDE 0.9 % IV SOLN
750.0000 mg | INTRAVENOUS | Status: AC
  Administered 2020-05-15: 750 mg via INTRAVENOUS
  Filled 2020-05-14: qty 750

## 2020-05-14 MED ORDER — PLASMA-LYTE 148 IV SOLN
INTRAVENOUS | Status: DC
  Filled 2020-05-14: qty 2.5

## 2020-05-14 MED ORDER — SODIUM CHLORIDE 0.9 % IV SOLN
INTRAVENOUS | Status: DC | PRN
  Administered 2020-05-14: 250 mL via INTRAVENOUS

## 2020-05-14 MED ORDER — MANNITOL 20 % IV SOLN
Freq: Once | INTRAVENOUS | Status: DC
  Filled 2020-05-14: qty 13

## 2020-05-14 MED ORDER — DEXMEDETOMIDINE HCL 200 MCG/2ML IV SOLN
INTRAVENOUS | Status: DC | PRN
  Administered 2020-05-14: 16 ug via INTRAVENOUS

## 2020-05-14 MED ORDER — DEXMEDETOMIDINE HCL IN NACL 400 MCG/100ML IV SOLN
0.1000 ug/kg/h | INTRAVENOUS | Status: AC
  Administered 2020-05-15 (×2): .7 ug/kg/h via INTRAVENOUS
  Filled 2020-05-14: qty 100

## 2020-05-14 MED ORDER — PHENYLEPHRINE HCL-NACL 20-0.9 MG/250ML-% IV SOLN
30.0000 ug/min | INTRAVENOUS | Status: AC
  Administered 2020-05-15: 20 ug/min via INTRAVENOUS
  Filled 2020-05-14: qty 250

## 2020-05-14 MED ORDER — MILRINONE LACTATE IN DEXTROSE 20-5 MG/100ML-% IV SOLN
0.3000 ug/kg/min | INTRAVENOUS | Status: DC
  Filled 2020-05-14: qty 100

## 2020-05-14 NOTE — Consult Note (Signed)
[address]       [place] [address]             [phone]          CARDIOTHORACIC SURGERY CONSULTATION REPORT  PCP is [name], [name], NP Referring Provider is Sanda Klein, MD Primary Cardiologist is [name], MD  Reason for consultation:  Bacterial endocarditis  HPI:  Patient is a 74 year old moderately obese African-American male with history of severe symptomatic aortic stenosis, nonobstructive coronary artery disease, hypertension, hyperlipidemia, type 2 diabetes mellitus, metastatic prostate cancer on suppression chemotherapy, and recent GI bleed who was admitted to [facility] yesterday with febrile illness and positive blood cultures and 2D echocardiogram revealing findings consistent with bacterial endocarditis with severe aortic insufficiency.  Transesophageal echocardiogram was performed this afternoon by Dr. 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  Urgent cardiac surgical consultation was requested.  Patient has history of aortic stenosis which progressed in severity on follow-up transthoracic echocardiogram.  Transthoracic echocardiogram performed February 05, 2020 revealed normal left ventricular systolic function with severe aortic stenosis with peak velocity across the aortic valve measured Greater than 4.0 m/s corresponding to mean transvalvular gradient estimated 44 mmHg and aortic valve area calculated 0.88 cm by VTI.  There was no aortic insufficiency at that time.  He was referred to the multidisciplinary heart valve clinic and initially evaluated by Dr. Burt Knack on February 08, 2020.  Diagnostic cardiac catheterization revealed widely patent coronary arteries with mild nonobstructive coronary artery disease.  Right  heart pressures were normal.  CT angiography was performed and the patient was referred for surgical consultation.  He was evaluated by Dr. Cyndia Bent on February 27, 2020 and felt to be relatively good candidate for transcatheter aortic valve replacement as an alternative to conventional surgery.  The patient was noted to have poor dentition and was seen by an oral surgeon with plans to undergo dental extraction.  Patient was hospitalized 2 weeks ago with low-grade fever, symptomatic anemia, melanotic stools, and generalized fatigue.  Hemoglobin was 6.3 on admission down from 7.31-week earlier.  He was transfused packed red blood cells and underwent both EGD and colonoscopy.  EGD was unremarkable and colonoscopy revealed diverticulosis with nonbleeding cecal telangiectasias which were treated with APC.  Follow-up urine culture and blood cultures performed as an outpatient grew multiple organisms including both E. coli and Klebsiella from urine culture with blood cultures reportedly positive for Streptococcus species.  The patient was readmitted to [facility] yesterday where echocardiogram revealed the new finding of severe aortic insufficiency with suspicion for possible vegetation on both the left and right cusps of the aortic valve.  Left ventricular function remain normal.  The patient was started on broad-spectrum antibiotics and transferred to [facility] where he underwent transesophageal echocardiogram this afternoon.  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  Cardiothoracic surgical consultation was requested.  Patient is interviewed awake and alert in the endoscopy suite recovery area.  He has been a widower and lives alone locally in [place] by himself.  He has a brother and sister who live  locally nearby as well as a stepson and a close friend.  He states that up until recently he has remained physically active and functionally independent.  He notes that over the past several months he has gone progressively downhill.  He states that over the last 6 to 8 weeks he has developed worsening exertional shortness of breath with dramatically worsening fatigue and decreased energy.  He has had poor appetite and low-grade fevers for the last several weeks.  He has no difficulty swallowing.  He reports bowel function has been regular and he has not had any hematochezia or melena over the last 2 weeks.  Over the past 24 to 48 hours he states his breathing has suddenly gotten considerably worse and he is now short of breath at rest although he can lay flat comfortably in bed.  He denies any chest pain or chest tightness.  He is not having any abdominal pain.  He has no dysuria.  He has chronic lower extremity edema.  He has still not undergone dental extraction.  He denies any headaches or transient visual disturbances.  He has not had any other neurologic symptoms.  Past Medical History:  Diagnosis Date  . AVM (arteriovenous malformation) of colon   . Cancer [facility])    prostate cancer  . DIABETES MELLITUS, TYPE II 07/06/2007  . ED (erectile dysfunction)   . Elevated PSA 01/20/2012  . Elevated PSA   . History of nuclear stress test    Myoview 8/18: EF 64, inferobasal thinning, no ischemia, low risk  . HYPERLIPIDEMIA 07/06/2007  . HYPERTENSION 07/06/2007  . HYPOTHYROIDISM 07/06/2007  . Leg pain    ABIs 8/18:  Normal   . Obesity   . Severe aortic stenosis     Past Surgical History:  Procedure Laterality Date  . COLONOSCOPY    . COLONOSCOPY N/A 05/03/2020   Procedure: COLONOSCOPY;  Surgeon: Juanita Craver, MD;  Location: [facility] ENDOSCOPY;  Service: Endoscopy;  Laterality: N/A;  . COLONOSCOPY W/ BIOPSIES AND POLYPECTOMY    . ENTEROSCOPY N/A 05/02/2020   Procedure: ENTEROSCOPY;  Surgeon: Rush Landmark  Telford Nab., MD;  Location: [facility];  Service: Gastroenterology;  Laterality: N/A;  . HEMOSTASIS CLIP PLACEMENT  05/03/2020   Procedure: HEMOSTASIS CLIP PLACEMENT;  Surgeon: Juanita Craver, MD;  Location: [facility] ENDOSCOPY;  Service: Endoscopy;;  . HOT HEMOSTASIS N/A 05/03/2020   Procedure: HOT HEMOSTASIS (ARGON PLASMA COAGULATION/BICAP);  Surgeon: Juanita Craver, MD;  Location: [facility];  Service: Endoscopy;  Laterality: N/A;  . IR IMAGING GUIDED PORT INSERTION  07/05/2018  . POLYPECTOMY    . RIGHT/LEFT HEART CATH AND CORONARY ANGIOGRAPHY N/A 02/13/2020   Procedure: RIGHT/LEFT HEART CATH AND CORONARY ANGIOGRAPHY;  Surgeon: Belva Crome, MD;  Location: [facility];  Service: Cardiovascular;  Laterality: N/A;  . SUBMUCOSAL INJECTION  05/02/2020   Procedure: SUBMUCOSAL INJECTION;  Surgeon: Rush Landmark Telford Nab., MD;  Location: [facility];  Service: Gastroenterology;;    Family History  Problem Relation Age of Onset  . Hypertension Other   . Breast cancer Sister   . Breast cancer Other        family history  . Breast cancer Brother        prostate ca  . Aneurysm Mother        Brain aneurysm   . Prostate cancer Father   . Colon cancer Neg Hx   . Esophageal cancer Neg Hx   . Stomach cancer Neg Hx   . Rectal cancer Neg Hx  Social History   Socioeconomic History  . Marital status: Widowed    Spouse name: Not on file  . Number of children: Not on file  . Years of education: Not on file  . Highest education level: Not on file  Occupational History  . Not on file  Tobacco Use  . Smoking status: Former Research scientist (life sciences)  . Smokeless tobacco: Never Used  . Tobacco comment: quit 2005  Vaping Use  . Vaping Use: Never used  Substance and Sexual Activity  . Alcohol use: Not Currently    Alcohol/week: 21.0 standard drinks    Types: 21 Cans of beer per week  . Drug use: No  . Sexual activity: Not on file  Other Topics Concern  . Not on file  Social History Narrative   He works  as a Freight forwarder    Not married    No kids          Social Determinants of Radio broadcast assistant Strain:   . Difficulty of Paying Living Expenses: Not on file  Food Insecurity:   . Worried About Charity fundraiser in the Last Year: Not on file  . Ran Out of Food in the Last Year: Not on file  Transportation Needs:   . Lack of Transportation (Medical): Not on file  . Lack of Transportation (Non-Medical): Not on file  Physical Activity:   . Days of Exercise per Week: Not on file  . Minutes of Exercise per Session: Not on file  Stress:   . Feeling of Stress : Not on file  Social Connections:   . Frequency of Communication with Friends and Family: Not on file  . Frequency of Social Gatherings with Friends and Family: Not on file  . Attends Religious Services: Not on file  . Active Member of Clubs or Organizations: Not on file  . Attends Archivist Meetings: Not on file  . Marital Status: Not on file  Intimate Partner Violence:   . Fear of Current or Ex-Partner: Not on file  . Emotionally Abused: Not on file  . Physically Abused: Not on file  . Sexually Abused: Not on file    Prior to Admission medications   Medication Sig Start Date End Date Taking? Authorizing Provider  abiraterone acetate (ZYTIGA) 250 MG tablet TAKE 4 TABLETS (1,000 MG TOTAL) BY MOUTH DAILY. TAKE ON AN EMPTY STOMACH 1 HOUR BEFORE OR 2 HOURS AFTER A MEAL 04/16/20  Yes Wyatt Portela, MD  aspirin EC 81 MG tablet Take 81 mg by mouth daily.   Yes [provider]  atorvastatin (LIPITOR) 40 MG tablet TAKE 1 TABLET EVERY DAY Patient taking differently: Take 40 mg by mouth daily.  11/07/19  Yes [name], [name], NP  cholecalciferol (VITAMIN D3) 25 MCG (1000 UT) tablet Take 1,000 Units by mouth daily.   Yes [provider]  cloNIDine (CATAPRES) 0.2 MG tablet Take 1 tablet (0.2 mg total) by mouth 2 (two) times daily. 04/17/20 04/12/21 Yes Eileen Stanford, PA-C  doxazosin  (CARDURA) 8 MG tablet TAKE 1 TABLET AT BEDTIME Patient taking differently: Take 8 mg by mouth at bedtime.  08/01/19  Yes [name], [name], NP  ferrous sulfate 325 (65 FE) MG EC tablet Take 1 tablet (325 mg total) by mouth 2 (two) times daily before a meal. 04/24/20  Yes Willia Craze, NP  fosinopril (MONOPRIL) 40 MG tablet Take 40 mg by mouth in the morning and at bedtime.  05/08/20  Yes  [provider]  furosemide (LASIX) 40 MG tablet Take 40 mg by mouth 2 (two) times daily.  05/08/20  Yes [provider]  glipiZIDE (GLUCOTROL XL) 10 MG 24 hr tablet TAKE 1 TABLET TWICE DAILY Patient taking differently: Take 10 mg by mouth in the morning and at bedtime.  01/16/20  Yes [name], [name], NP  labetalol (NORMODYNE) 200 MG tablet TAKE 3 TABLETS EVERY MORNING  AND TAKE 2 TABLETS EVERY EVENING Patient taking differently: Take 400-600 mg by mouth See admin instructions. Takes 3 tablets in the morning and 2 tablets every evening. 11/07/19  Yes [name], [name], NP  levothyroxine (SYNTHROID) 150 MCG tablet TAKE 1 TABLET EVERY DAY Patient taking differently: Take 150 mcg by mouth daily before breakfast.  08/01/19  Yes [name], [name], NP  lidocaine-prilocaine (EMLA) cream Apply 1 application topically as needed. Patient taking differently: Apply 1 application topically as needed (port).  06/23/18  Yes Wyatt Portela, MD  metFORMIN (GLUCOPHAGE) 1000 MG tablet TAKE 1 TABLET TWICE DAILY WITH MEALS Patient taking differently: Take 1,000 mg by mouth 2 (two) times daily with a meal.  04/02/20  Yes [name], [name], NP  NIFEdipine (PROCARDIA XL/NIFEDICAL XL) 60 MG 24 hr tablet TAKE 1 TABLET EVERY DAY Patient taking differently: Take 60 mg by mouth daily.  11/07/19  Yes [name], [name], NP  pioglitazone (ACTOS) 15 MG tablet TAKE 1 TABLET BY MOUTH EVERY DAY Patient taking differently: Take 15 mg by mouth daily.  05/12/20  Yes [name], [name], NP  potassium chloride SA (KLOR-CON) 20 MEQ tablet Take 1 tablet (20 mEq  total) by mouth 2 (two) times daily. TAKE 1 TABLET TWICE DAILY Patient taking differently: Take 20 mEq by mouth 2 (two) times daily.  05/04/20  Yes Rai, Ripudeep K, MD  predniSONE (DELTASONE) 5 MG tablet TAKE 1 TABLET (5 MG TOTAL) BY MOUTH DAILY WITH BREAKFAST. 10/17/19  Yes Shadad, Mathis Dad, MD  furosemide (LASIX) 80 MG tablet Take 0.5 tablets (40 mg total) by mouth daily. Patient not taking: Reported on 05/13/2020 05/12/20 05/12/21  Eileen Stanford, PA-C  omeprazole (PRILOSEC) 40 MG capsule TAKE 1 CAPSULE BY MOUTH EVERY DAY Patient taking differently: Take 40 mg by mouth daily. Patient don't remember taking this medication. 04/30/20   Willia Craze, NP  sulfamethoxazole-trimethoprim (BACTRIM DS) 800-160 MG tablet Take 1 tablet by mouth 2 (two) times daily. 05/12/20   Eileen Stanford, PA-C    Current Facility-Administered Medications  Medication Dose Route Frequency Provider Last Rate Last Admin  . [MAR Hold] 0.9 %  sodium chloride infusion   Intravenous PRN Dessa Phi, DO   Stopped at 05/14/20 1210  . 0.9 %  sodium chloride infusion   Intravenous Continuous Croitoru, Mihai, MD   New Bag at 05/14/20 1335  . [MAR Hold] cefTRIAXone (ROCEPHIN) 2 g in sodium chloride 0.9 % 100 mL IVPB  2 g Intravenous Q24H Harold Hedge, MD 200 mL/hr at 05/14/20 1123 2 g at 05/14/20 1123  . [MAR Hold] furosemide (LASIX) injection 40 mg  40 mg Intravenous Q12H Lelon Perla, MD   40 mg at 05/14/20 1107  . [MAR Hold] levothyroxine (SYNTHROID) tablet 150 mcg  150 mcg Oral Daily Harold Hedge, MD   150 mcg at 05/13/20 1834  . [MAR Hold] metoprolol tartrate (LOPRESSOR) injection 5 mg  5 mg Intravenous Q8H PRN Harold Hedge, MD      . Doug Sou Hold] pantoprazole (PROTONIX) EC tablet 40 mg  40 mg Oral Daily Neysa Bonito,  Chauncy Passy, MD   40 mg at 05/14/20 1106    No Known Allergies    Review of Systems:   Per HPI - remainder non-contributory    Physical Exam:   BP (!) 129/50   Pulse (!) 101   Temp 99 F (37.2  C) (Axillary)   Resp (!) 24   Ht 5\' 7"  (1.702 m)   Wt 98.3 kg   SpO2 97%   BMI 33.94 kg/m   General:  Moderately obese AA male NAD but somewhat tachypneic  HEENT:  Unremarkable other than poor dentition  Neck:   no JVD, no bruits, no adenopathy   Chest:   clear to auscultation, symmetrical breath sounds, no wheezes, no rhonchi   CV:   RRR, prominent systolic and holodiastolic murmurs  Abdomen:  soft, non-tender, no masses   Extremities:  warm, well-perfused, pulses palpable, 2+ bilateral lower extremity edema  Rectal/GU  Deferred  Neuro:   Grossly non-focal and symmetrical throughout  Skin:   Clean and dry, no rashes, no breakdown  Diagnostic Tests:  Lab Results: Recent Labs    05/13/20 1204 05/14/20 0505  WBC 13.6* 12.2*  HGB 7.7* 7.7*  HCT 24.0* 24.0*  PLT 240 245   BMET:  Recent Labs    05/13/20 1204 05/14/20 0505  NA 133* 135  K 4.7 3.4*  CL 103 104  CO2 16* 20*  GLUCOSE 96 74  BUN 18 16  CREATININE 1.05 0.92  CALCIUM 8.4* 7.7*    CBG (last 3)  Recent Labs    05/14/20 0738 05/14/20 0820 05/14/20 1105  GLUCAP 59* 93 83   PT/INR:   Recent Labs    05/14/20 0505  LABPROT 14.9  INR 1.2     BLOOD CULTURES (05/11/2020)  Streptococcus species NOT DETECTED DETECTEDAbnormal   Comment: Not Enterococcus species, Streptococcus agalactiae, Streptococcus pyogenes, or Streptococcus pneumoniae.  CRITICAL RESULT CALLED TO, READ BACK BY AND VERIFIED WITH:  RN JAMIE B. [id number] FCP         CXR:  PORTABLE CHEST 1 VIEW  COMPARISON:  05/11/2020  FINDINGS: RIGHT-sided Port-A-Cath in stable position.  Trachea midline.  Cardiomediastinal contours are stable.  Mild central pulmonary vascular engorgement suggested with increasing opacities in the lung bases without signs of dense consolidative change. No definite effusion.  On limited assessment skeletal structures without acute process.  IMPRESSION: Mild central pulmonary vascular  engorgement suggested with increasing opacities in the lung bases without signs of dense consolidative change. Findings could represent atelectasis or developing infection particularly at the RIGHT lung base.   Electronically Signed   By: Zetta Bills M.D.   On: 05/13/2020 12:04       ECHOCARDIOGRAM REPORT     Patient Name:  Joenathan Sakuma. Date of Exam: 02/05/2020  Medical Rec #: [id number]     Height:    67.0 in  Accession #:  [id number]     Weight:    218.1 lb  Date of Birth: 1946-03-04     BSA:     2.098 m  Patient Age:  84 years      BP:      134/64 mmHg  Patient Gender: M         HR:      67 bpm.  Exam Location: [place]   Procedure: 2D Echo, Cardiac Doppler and Color Doppler   Indications:  I35.0 Aortic Stenosis    History:    Patient has prior history of Echocardiogram examinations,  most         recent 01/26/2019. Risk Factors:Hypertension, Dyslipidemia  and         Diabetes. Murmur. Hypothyroidism.    Sonographer:  Cresenciano Lick RDCS  Referring Phys: [id number] WEAVER   IMPRESSIONS    1. The aortic valve is tricuspid. Aortic valve regurgitation is not  visualized. Severe aortic valve stenosis. Aortic valve area, by VTI  measures 0.88 cm. Aortic valve mean gradient measures 44.0 mmHg. Aortic  valve Vmax measures 4.03 m/s.  2. Left ventricular ejection fraction, by estimation, is 65 to 70%. The  left ventricle has normal function. The left ventricle has no regional  wall motion abnormalities. There is mild concentric left ventricular  hypertrophy. Left ventricular diastolic  parameters are consistent with Grade I diastolic dysfunction (impaired  relaxation).  3. Right ventricular systolic function is normal. The right ventricular  size is normal. There is normal pulmonary artery systolic pressure. The  estimated right ventricular systolic  pressure is 90.2 mmHg.  4. Left atrial size was mildly dilated.  5. The mitral valve is grossly normal. Mild mitral valve regurgitation.  No evidence of mitral stenosis.  6. The inferior vena cava is normal in size with greater than 50%  respiratory variability, suggesting right atrial pressure of 3 mmHg.   Comparison(s): Changes from prior study are noted. Aortic stenosis is now  severe. Values obtained from RSB.   FINDINGS  Left Ventricle: Left ventricular ejection fraction, by estimation, is 65  to 70%. The left ventricle has normal function. The left ventricle has no  regional wall motion abnormalities. The left ventricular internal cavity  size was normal in size. There is  mild concentric left ventricular hypertrophy. Left ventricular diastolic  parameters are consistent with Grade I diastolic dysfunction (impaired  relaxation). Normal left ventricular filling pressure.   Right Ventricle: The right ventricular size is normal. No increase in  right ventricular wall thickness. Right ventricular systolic function is  normal. There is normal pulmonary artery systolic pressure. The tricuspid  regurgitant velocity is 2.63 m/s, and  with an assumed right atrial pressure of 3 mmHg, the estimated right  ventricular systolic pressure is 40.9 mmHg.   Left Atrium: Left atrial size was mildly dilated.   Right Atrium: Right atrial size was normal in size.   Pericardium: Trivial pericardial effusion is present. Presence of  pericardial fat pad.   Mitral Valve: The mitral valve is grossly normal. Mild mitral valve  regurgitation. No evidence of mitral valve stenosis.   Tricuspid Valve: The tricuspid valve is grossly normal. Tricuspid valve  regurgitation is mild.   Aortic Valve: The aortic valve is tricuspid. . There is severe thickening  and severe calcifcation of the aortic valve. Aortic valve regurgitation is  not visualized. Severe aortic stenosis is present. There is severe    thickening of the aortic valve. There  is severe calcifcation of the aortic valve. Aortic valve mean gradient  measures 44.0 mmHg. Aortic valve peak gradient measures 65.0 mmHg. Aortic  valve area, by VTI measures 0.88 cm.   Pulmonic Valve: The pulmonic valve was grossly normal. Pulmonic valve  regurgitation is not visualized. No evidence of pulmonic stenosis.   Aorta: The aortic root and ascending aorta are structurally normal, with  no evidence of dilitation.   Venous: The inferior vena cava is normal in size with greater than 50%  respiratory variability, suggesting right atrial pressure of 3 mmHg.   IAS/Shunts: The atrial septum is grossly normal.  LEFT VENTRICLE  PLAX 2D  LVIDd:     4.50 cm Diastology  LVIDs:     2.90 cm LV e' lateral:  7.83 cm/s  LV PW:     1.30 cm LV E/e' lateral: 12.2  LV IVS:    1.30 cm LV e' medial:  6.47 cm/s  LVOT diam:   2.00 cm LV E/e' medial: 14.7  LV SV:     92  LV SV Index:  44  LVOT Area:   3.14 cm     RIGHT VENTRICLE  RV Basal diam: 3.70 cm  RV S prime:   18.00 cm/s  TAPSE (M-mode): 2.8 cm   LEFT ATRIUM       Index    RIGHT ATRIUM      Index  LA diam:    5.30 cm 2.53 cm/m RA Area:   16.40 cm  LA Vol (A2C):  65.3 ml 31.12 ml/m RA Volume:  49.60 ml 23.64 ml/m  LA Vol (A4C):  74.1 ml 35.32 ml/m  LA Biplane Vol: 69.7 ml 33.22 ml/m  AORTIC VALVE  AV Area (Vmax):  0.86 cm  AV Area (Vmean):  0.86 cm  AV Area (VTI):   0.88 cm  AV Vmax:      403.00 cm/s  AV Vmean:     280.800 cm/s  AV VTI:      1.040 m  AV Peak Grad:   65.0 mmHg  AV Mean Grad:   44.0 mmHg  LVOT Vmax:     110.00 cm/s  LVOT Vmean:    77.000 cm/s  LVOT VTI:     0.292 m  LVOT/AV VTI ratio: 0.28    AORTA  Ao Root diam: 3.20 cm  Ao Asc diam: 3.30 cm   MITRAL VALVE        TRICUSPID VALVE  MV Area (PHT): 3.53 cm   TR Peak grad:  27.7 mmHg   MV Decel Time: 215 msec   TR Vmax:    263.00 cm/s  MV E velocity: 95.20 cm/s  MV A velocity: 104.00 cm/s SHUNTS  MV E/A ratio: 0.92     Systemic VTI: 0.29 m               Systemic Diam: 2.00 cm   Eleonore Chiquito MD  Electronically signed by Eleonore Chiquito MD  Signature Date/Time: 02/05/2020/4:48:13 PM        RIGHT/LEFT HEART CATH AND CORONARY ANGIOGRAPHY  Conclusion   Calcific aortic stenosis with mean aortic valve gradient 28 mmHg.  Aortic valve area 2.37 cm based upon dynamic expectedly high cardiac output of 10.3 L/min.  Widely patent coronary arteries.  LAD with eccentric 40 to 50% mid to distal narrowing..  Eccentric 50% mid RCA.  50% mid circumflex in the segment beyond the first obtuse marginal.  Normal right heart pressures.  Normal pulmonary capillary wedge pressure, mean 8 mmHg.  RECOMMENDATIONS:   Will forward information to Dr. Burt Knack. Surgeon Notes    02/13/2020 2:10 PM CV Procedure signed by Belva Crome, MD  Indications  Nonrheumatic aortic valve stenosis [I35.0 (ICD-10-CM)]  Procedural Details  Technical Details The right radial area was sterilely prepped and draped. Intravenous sedation with Versed and fentanyl was administered. 1% Xylocaine was infiltrated to achieve local analgesia. Using real-time vascular ultrasound, a double wall stick with an angiocath was utilized to obtain intra-arterial access. A VUS image was saved for the permanent record.The modified Seldinger technique was used to place a 5 F " Slender" sheath in  the right radial artery. Weight based heparin was administered. Coronary angiography was done using 5 F catheters. Right coronary angiography was performed with a JR4. Left ventricular hemodymic recordings and angiography was done using the JR 4 catheter and hand injection. Left coronary angiography was performed with a JL 3.5 cm.  Right heart catheterization was performed by exchanging a previously  placed antecubital IV angio-cath for a 5 French Slender sheath. 1% Xylocaine was used to locally nesthetize the area around the IV site. The IV catheter was wired using an .018 guidewire. The modified Seldinger technique was used to place the 6-7 [place] sheath. Double glove technique was used to enhance sterility. After sheath insertion, right heart cath was performed using a 5 French balloon tipped catheter and fluoroscopic guidance. Pressures were recorded in each chamber and in the pulmonary capillary wedge position. The main pulmonary artery O2 saturation was sampled.   Hemostasis was achieved using a pneumatic band.  During this procedure the patient is administered a total of Versed 1 mg and Fentanyl 25 mcg to achieve and maintain moderate conscious sedation.  The patient's heart rate, blood pressure, and oxygen saturation are monitored continuously during the procedure. The period of conscious sedation is 34 minutes, of which I was present face-to-face 100% of this time. Estimated blood loss <50 mL.   During this procedure medications were administered to achieve and maintain moderate conscious sedation while the patient's heart rate, blood pressure, and oxygen saturation were continuously monitored and I was present face-to-face 100% of this time.  Medications (Filter: Administrations occurring from 1309 to 1412 on 02/13/20) lidocaine (PF) (XYLOCAINE) 1 % injection (mL) Total volume:  4 mL Date/Time  Rate/Dose/Volume Action  02/13/20 1333  2 mL Given  1335  2 mL Given    Radial Cocktail/Verapamil only (mL) Total volume:  10 mL Date/Time  Rate/Dose/Volume Action  02/13/20 1337  10 mL Given    fentaNYL (SUBLIMAZE) injection (mcg) Total dose:  25 mcg Date/Time  Rate/Dose/Volume Action  02/13/20 1329  25 mcg Given    midazolam (VERSED) injection (mg) Total dose:  1 mg Date/Time  Rate/Dose/Volume Action  02/13/20 1329  1 mg Given    heparin sodium (porcine) injection (Units) Total  dose:  5,000 Units Date/Time  Rate/Dose/Volume Action  02/13/20 1347  5,000 Units Given    Heparin (Porcine) in NaCl 1000-0.9 UT/500ML-% SOLN (mL) Total volume:  1,000 mL Date/Time  Rate/Dose/Volume Action  02/13/20 1354  500 mL Given  1354  500 mL Given    iohexol (OMNIPAQUE) 350 MG/ML injection (mL) Total volume:  60 mL Date/Time  Rate/Dose/Volume Action  02/13/20 1407  60 mL Given    potassium chloride (KLOR-CON) CR tablet (mEq) Total dose:  20 mEq Date/Time  Rate/Dose/Volume Action  02/13/20 1410  20 mEq Given    Sedation Time  Sedation Time Physician-1: 34 minutes 56 seconds  Contrast  Medication Name Total Dose  iohexol (OMNIPAQUE) 350 MG/ML injection 60 mL    Radiation/Fluoro  Fluoro time: 7.4 (min) DAP: 17.6 (Gycm2) Cumulative Air Kerma: 325.5 (mGy)  Coronary Findings  Diagnostic Dominance: Right Left Anterior Descending  Mid LAD lesion is 50% stenosed.  Left Circumflex  Prox Cx to Mid Cx lesion is 50% stenosed.  Right Coronary Artery  Mid RCA lesion is 45% stenosed.  Intervention  No interventions have been documented. Right Heart  Right Heart Pressures Hemodynamic findings consistent with pulmonary hypertension and aortic valve stenosis. LV EDP is normal.  Left Heart  Aortic Valve  There is moderate aortic valve stenosis. The aortic valve is calcified. There is restricted aortic valve motion.  Coronary Diagrams  Diagnostic Dominance: Right  Intervention  Implants   No implant documentation for this case.  Syngo Images  Show images for CARDIAC CATHETERIZATION Images on Long Term Storage  Show images for Shahzaib, Azevedo. Link to Procedure Log  Procedure Log    Hemo Data   Most Recent Value  Fick Cardiac Output 10.32 L/min  Fick Cardiac Output Index 4.94 (L/min)/BSA  Aortic Mean Gradient 29.28 mmHg  Aortic Peak Gradient 28 mmHg  Aortic Valve Area 2.37  Aortic Value Area Index 1.14 cm2/BSA  RA A Wave 7 mmHg  RA V Wave 4 mmHg    RA Mean 3 mmHg  RV Systolic Pressure 28 mmHg  RV Diastolic Pressure 0 mmHg  RV EDP 3 mmHg  PA Systolic Pressure 25 mmHg  PA Diastolic Pressure 8 mmHg  PA Mean 14 mmHg  PW A Wave 14 mmHg  PW V Wave 8 mmHg  PW Mean 8 mmHg  AO Systolic Pressure 785 mmHg  AO Diastolic Pressure 67 mmHg  AO Mean 94 mmHg  LV Systolic Pressure 885 mmHg  LV Diastolic Pressure 6 mmHg  LV EDP 11 mmHg  AOp Systolic Pressure 027 mmHg  AOp Diastolic Pressure 58 mmHg  AOp Mean Pressure 86 mmHg  LVp Systolic Pressure 741 mmHg  LVp Diastolic Pressure 0 mmHg  LVp EDP Pressure 9 mmHg  QP/QS 1  TPVR Index 2.83 HRUI  TSVR Index 19.03 HRUI  PVR SVR Ratio 0.07  TPVR/TSVR Ratio 0.15         ECHOCARDIOGRAM REPORT       Patient Name:  Karsyn Rochin. Date of Exam: 05/13/2020  Medical Rec #: [id number]     Height:    67.0 in  Accession #:  [id number]     Weight:    210.0 lb  Date of Birth: Mar 30, 1946     BSA:     2.065 m  Patient Age:  85 years      BP:      103/44 mmHg  Patient Gender: M         HR:      76 bpm.  Exam Location: Inpatient   Procedure: 2D Echo, Cardiac Doppler and Color Doppler   Indications:  Bacteremia    History:    Patient has prior history of Echocardiogram examinations,  most         recent 02/05/2020. CHF, Aortic Valve Disease,         Signs/Symptoms:Bacteremia and Resp. distress; Risk         Factors:Dyslipidemia. Anemia.    Sonographer:  Dustin Flock  Referring Phys: [id number] [place]    1. Left ventricular ejection fraction, by estimation, is 65 to 70%. The  left ventricle has normal function. The left ventricle has no regional  wall motion abnormalities. There is mild concentric left ventricular  hypertrophy. Left ventricular diastolic  parameters are consistent with Grade II diastolic dysfunction  (pseudonormalization). Elevated left atrial  pressure.  2. Right ventricular systolic function is normal. The right ventricular  size is normal. There is mildly elevated pulmonary artery systolic  pressure.  3. Left atrial size was mildly dilated.  4. The mitral valve is normal in structure. Mild mitral valve  regurgitation.  5. There is suspicion for a vegetation on both the left and the right  coronary cusps and an accompanying  eccentric jet of aortic insufficiency  from the base of the right-left commisure. Cannot exclude a flail segment  of the right cusp. The aortic valve  is tricuspid. There is moderate calcification of the aortic valve. There  is moderate thickening of the aortic valve. Aortic valve regurgitation is  severe. Mild to moderate aortic valve stenosis.   Comparison(s): Changes from prior study are noted. The aortic valve  gradients are lower and the aortic leaflets appear more mobile.  Unfortunately, this may be due to right aortic cusp destruction with a  flail component and severe aortic insufficiency.  Recommend TEE.   FINDINGS  Left Ventricle: Left ventricular ejection fraction, by estimation, is 65  to 70%. The left ventricle has normal function. The left ventricle has no  regional wall motion abnormalities. The left ventricular internal cavity  size was normal in size. There is  mild concentric left ventricular hypertrophy. Left ventricular diastolic  parameters are consistent with Grade II diastolic dysfunction  (pseudonormalization). Elevated left atrial pressure.   Right Ventricle: The right ventricular size is normal. No increase in  right ventricular wall thickness. Right ventricular systolic function is  normal. There is mildly elevated pulmonary artery systolic pressure. The  tricuspid regurgitant velocity is 3.07  m/s, and with an assumed right atrial pressure of 3 mmHg, the estimated  right ventricular systolic pressure is 16.1 mmHg.   Left Atrium: Left atrial size was mildly  dilated.   Right Atrium: Right atrial size was normal in size.   Pericardium: There is no evidence of pericardial effusion.   Mitral Valve: The mitral valve is normal in structure. Mild mitral valve  regurgitation, with centrally-directed jet.   Tricuspid Valve: The tricuspid valve is normal in structure. Tricuspid  valve regurgitation is mild.   Aortic Valve: There is suspicion for a vegetation on both the left and the  right coronary cusps and an accompanying eccentric jet of aortic  insufficiency from the base of the right-left commisure. Cannot exclude a  flail segment of the right cusp. The  aortic valve is tricuspid. There is moderate calcification of the aortic  valve. There is moderate thickening of the aortic valve. Aortic valve  regurgitation is severe. Mild to moderate aortic stenosis is present.  Aortic valve mean gradient measures 12.0  mmHg. Aortic valve peak gradient measures 22.1 mmHg. Aortic valve area, by  VTI measures 1.90 cm.   Pulmonic Valve: The pulmonic valve was normal in structure. Pulmonic valve  regurgitation is not visualized.   Aorta: The aortic root is normal in size and structure.   IAS/Shunts: No atrial level shunt detected by color flow Doppler.     LEFT VENTRICLE  PLAX 2D  LVIDd:     5.30 cm Diastology  LVIDs:     3.30 cm LV e' medial:  6.20 cm/s  LV PW:     1.60 cm LV E/e' medial: 20.5  LV IVS:    1.50 cm LV e' lateral:  8.70 cm/s  LVOT diam:   2.20 cm LV E/e' lateral: 14.6  LV SV:     93  LV SV Index:  45  LVOT Area:   3.80 cm     RIGHT VENTRICLE  RV Basal diam: 3.30 cm  RV S prime:   21.30 cm/s  TAPSE (M-mode): 3.2 cm   LEFT ATRIUM       Index    RIGHT ATRIUM      Index  LA diam:    4.30 cm 2.08  cm/m RA Area:   17.20 cm  LA Vol (A2C):  62.3 ml 30.18 ml/m RA Volume:  47.00 ml 22.76 ml/m  LA Vol (A4C):  52.0 ml 25.19 ml/m  LA Biplane Vol: 57.8 ml 28.00 ml/m    AORTIC VALVE  AV Area (Vmax):  1.76 cm  AV Area (Vmean):  1.77 cm  AV Area (VTI):   1.90 cm  AV Vmax:      235.25 cm/s  AV Vmean:     160.000 cm/s  AV VTI:      0.487 m  AV Peak Grad:   22.1 mmHg  AV Mean Grad:   12.0 mmHg  LVOT Vmax:     109.00 cm/s  LVOT Vmean:    74.700 cm/s  LVOT VTI:     0.244 m  LVOT/AV VTI ratio: 0.50    AORTA  Ao Root diam: 3.30 cm   MITRAL VALVE        TRICUSPID VALVE  MV Area (PHT): 7.66 cm   TR Peak grad:  37.7 mmHg  MV Decel Time: 99 msec   TR Vmax:    307.00 cm/s  MV E velocity: 127.00 cm/s  MV A velocity: 82.00 cm/s  SHUNTS  MV E/A ratio: 1.55     Systemic VTI: 0.24 m               Systemic Diam: 2.20 cm   [name] Croitoru MD  Electronically signed by Sanda Klein MD  Signature Date/Time: 05/13/2020/4:52:20 PM        TRANSESOPHOGEAL ECHO REPORT       Patient Name:  Kase Shughart. Date of Exam: 05/14/2020  Medical Rec #: [id number]     Height:    67.0 in  Accession #:  [id number]     Weight:    216.7 lb  Date of Birth: [birth year]     BSA:     2.092 m  Patient Age:  69 years      BP:      136/49 mmHg  Patient Gender: M         HR:      87 bpm.  Exam Location: Inpatient   Procedure: Transesophageal Echo, Color Doppler and Cardiac Doppler   Indications:   Endocarditis    History:     Patient has prior history of Echocardiogram examinations,  most          recent 05/13/2020. Aortic Valve Disease; Risk          Factors:Hypertension, Dyslipidemia and Diabetes.    Sonographer:   Mikki Santee RDCS (AE)  Referring Phys: [place]  Diagnosing Phys: Sanda Klein MD   PROCEDURE: After discussion of the risks and benefits of a TEE, an  informed consent was obtained from the patient. The transesophogeal probe  was passed without difficulty through the  esophogus of the patient. Local  oropharyngeal anesthetic was provided  with Cetacaine. Sedation performed by different physician. The patient was  monitored while under deep sedation. Anesthestetic sedation was provided  intravenously by Anesthesiology: 200.1mg  of Propofol, 40mg  of Lidocaine.  The patient developed no  complications during the procedure.   IMPRESSIONS    1. Left ventricular ejection fraction, by estimation, is 70 to 75%. The  left ventricle has hyperdynamic function. The left ventricle has no  regional wall motion abnormalities. There is mild concentric left  ventricular hypertrophy.  2. Right ventricular systolic function is hyperdynamic. The right  ventricular size is normal.  3. Left  atrial size was mildly dilated. No left atrial/left atrial  appendage thrombus was detected.  4. A small pericardial effusion is present. The pericardial effusion is  circumferential. There is no evidence of cardiac tamponade.  5. The mitral valve is normal in structure. Trivial mitral valve  regurgitation.  6. There is endocarditis of the aortic valve with a torn/flail right  coronary cusp and a periannular abscess. The right coronary cusp, which  was previously severely limited in mobility, now has broad excursions in  systole and diastole, leading to a  reduced severity of the aortic stenosis. The aortic insufficiency jet is  highly eccentric, directed posteriorly and parallel with the plane of the  annulus. The aortic valve is tricuspid. There is moderate calcification of  the aortic valve. There is  severe thickening of the aortic valve. Aortic valve regurgitation is  severe. Mild to moderate aortic valve stenosis.  7. 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 There is mild (Grade II) plaque  involving the descending aorta and ascending aorta.   FINDINGS  Left Ventricle: Left ventricular ejection fraction, by estimation, is 70  to 75%. The left ventricle has hyperdynamic function. The left ventricle  has no regional wall motion abnormalities. The left ventricular internal  cavity size was normal in size.  There is mild concentric left ventricular hypertrophy. Left ventricular  diastolic function could not be evaluated due to nondiagnostic images.   Right Ventricle: The right ventricular size is normal. No increase in  right ventricular wall thickness. Right ventricular systolic function is  hyperdynamic.   Left Atrium: Left atrial size was mildly dilated. No left atrial/left  atrial appendage thrombus was detected.   Right Atrium: Right atrial size was normal in size.   Pericardium: A small pericardial effusion is present. The pericardial  effusion is circumferential. There is no evidence of cardiac tamponade.   Mitral Valve: The mitral valve is normal in structure. Trivial mitral  valve regurgitation.   Tricuspid Valve: The tricuspid valve is normal in structure. Tricuspid  valve regurgitation is not demonstrated.   Aortic Valve: There is endocarditis of the aortic valve with a torn/flail  right coronary cusp and a periannular abscess. The right coronary cusp,  which was previously severely limited in mobility, now has broad  excursions in systole and diastole, leading  to a reduced severity of the aortic stenosis. The aortic insufficiency  jet is highly eccentric, directed posteriorly and parallel with the plane  of the annulus. The aortic valve is tricuspid. There is moderate  calcification of the aortic valve. There is  severe thickening of the aortic valve. Aortic valve regurgitation is  severe. Aortic regurgitation PHT measures 142 msec. Mild to moderate  aortic stenosis is present.    Pulmonic Valve: The pulmonic valve was grossly normal. Pulmonic valve  regurgitation is not visualized.   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 The aortic root and ascending aorta  are normal in caliber. There is mild (  Grade II) plaque involving the  descending aorta and ascending aorta.   IAS/Shunts: No atrial level shunt detected by color flow Doppler.   Additional Comments: There is pleural effusion in the left lateral region.     AORTIC VALVE  AI PHT:   142 msec   Mihai Croitoru MD  Electronically signed by Sanda Klein MD  Signature Date/Time: 05/14/2020/2:44:44 PM       Impression:  I have personally reviewed the patient's multiple recent echocardiograms including the TEE performed this afternoon as well as previous diagnostic cardiac catheterization and CT angiograms.  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  Although the patient currently remains hemodynamically stable he is tachycardic with rapidly progressive acute diastolic congestive heart failure.  He needs urgent surgical intervention.    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  I discussed the eminently  life-threatening nature of these developments and the need for urgent surgical intervention.  We discussed the patient's previous multiple medical problems and reasonably good quality of life prior to the onset of his current clinical predicament.  The patient desires an aggressive approach to his care and specifically does NOT wish to be considered as no CODE BLUE as previously ordered by the hospital admission team.  Patient specifically requests that his CODE STATUS be corrected to full code and desires to proceed with surgical intervention.  Patient will be admitted to the intensive care unit with plans to proceed directly to the operating room first thing tomorrow morning for aortic valve replacement with likely human allograft aortic root replacement and 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.  The patient understands the imminently life-threatening nature of his current problems as well as the high risks associated with surgical intervention.  He understands and accepts all potential risks of surgery including but not limited to risk of death, stroke or other neurologic complication, myocardial infarction, congestive heart failure, respiratory failure, renal failure, bleeding requiring transfusion and/or reexploration, arrhythmia, infection or other wound complications, pneumonia, pleural and/or pericardial effusion, pulmonary embolus, aortic dissection or other major vascular complication, or delayed complications related to valve replacement including but not limited to structural valve deterioration and failure, thrombosis, embolization, persistent or recurrent endocarditis, or paravalvular leak.  All of his questions have been answered.  The patient's family has been notified via telephone.    I spent in excess of 60 minutes during the conduct of this hospital consultation and >50% of this time involved direct face-to-face encounter for counseling and/or coordination of the patient's  care.    Valentina Gu. Roxy Manns, MD 05/14/2020 3:00 PM

## 2020-05-14 NOTE — Anesthesia Preprocedure Evaluation (Addendum)
Anesthesia Evaluation  Patient identified by MRN, date of birth, ID band Patient awake    Reviewed: Allergy & Precautions, H&P , NPO status , Patient's Chart, lab work & pertinent test results  Airway Mallampati: II  TM Distance: >3 FB Neck ROM: Full    Dental no notable dental hx. (+) Poor Dentition, Dental Advisory Given   Pulmonary neg pulmonary ROS, former smoker,    Pulmonary exam normal breath sounds clear to auscultation       Cardiovascular Exercise Tolerance: Good hypertension, Pt. on medications + Valvular Problems/Murmurs AI  Rhythm:Regular Rate:Normal     Neuro/Psych negative neurological ROS  negative psych ROS   GI/Hepatic negative GI ROS, Neg liver ROS,   Endo/Other  diabetes, Type 2, Oral Hypoglycemic AgentsHypothyroidism   Renal/GU negative Renal ROS  negative genitourinary   Musculoskeletal   Abdominal   Peds  Hematology  (+) Blood dyscrasia, anemia ,   Anesthesia Other Findings   Reproductive/Obstetrics negative OB ROS                            Anesthesia Physical Anesthesia Plan  ASA: IV  Anesthesia Plan: General   Post-op Pain Management:    Induction: Intravenous  PONV Risk Score and Plan: 2 and Midazolam and Ondansetron  Airway Management Planned: Oral ETT  Additional Equipment: Arterial line, CVP, PA Cath, TEE, 3D TEE and Ultrasound Guidance Line Placement  Intra-op Plan:   Post-operative Plan: Post-operative intubation/ventilation  Informed Consent: I have reviewed the patients History and Physical, chart, labs and discussed the procedure including the risks, benefits and alternatives for the proposed anesthesia with the patient or authorized representative who has indicated his/her understanding and acceptance.     Dental advisory given  Plan Discussed with: CRNA  Anesthesia Plan Comments:        Anesthesia Quick Evaluation

## 2020-05-14 NOTE — Consult Note (Signed)
Cardiology Consultation:   Patient ID: Matthew Serrano. MRN: [id number]; DOB: Mar 05, 1946  Admit date: 05/13/2020 Date of Consult: 05/14/2020  Primary Care Provider: [name], NP [facility] Cardiologist: [name], MD     Patient Profile:   Matthew Serrano. is a 74 y.o. male with a hx of severe aortic stenosis, hypertension, hyperlipidemia, diabetes mellitus, prostate cancer, recent GI bleed who is being seen today for the evaluation of subacute bacterial endocarditis at the request of Dessa Phi, DO.  History of Present Illness:   Patient has been followed for aortic stenosis.  Echocardiogram June 2021 showed normal LV function, mild left ventricular hypertrophy, grade 1 diastolic dysfunction, severe aortic stenosis with mean gradient 44 mmHg, mild left atrial enlargement, mild mitral regurgitation.  Cardiac catheterization June 2021 showed nonobstructive coronary disease with 40 to 50% mid to distal LAD, 50% mid RCA, 50% mid circumflex and normal right heart pressures.  Aortic valve mean gradient 28 mmHg.  Plan was for TAVR.  However patient recently admitted with GI bleed.  He required transfusion.  EGD September 10 unremarkable.  Colonoscopy showed diverticulosis and multiple nonbleeding cecal angiectasia treated with APC.  Patient did have a fever during the hospitalization and blood cultures were drawn.  He also had urine cultures that returned positive for E. coli and Klebsiella and was treated with Bactrim.  His blood cultures returned with gram-positive cocci (strep species).  He was therefore asked to return to the hospital.  Patient does describe fevers and chills over the past 5 days.  He also notes increasing dyspnea on exertion for 48 hours but denies orthopnea or PND.  He has noticed increased pedal edema.  He denies chest pain or weight loss.  He has had no recent dental procedures.  Cardiology now asked to evaluate.   Past Medical History:  Diagnosis Date    . AVM (arteriovenous malformation) of colon   . [facility]    prostate cancer  . DIABETES MELLITUS, TYPE II 07/06/2007  . ED (erectile dysfunction)   . Elevated PSA 01/20/2012  . Elevated PSA   . History of nuclear stress test    Myoview 8/18: EF 64, inferobasal thinning, no ischemia, low risk  . HYPERLIPIDEMIA 07/06/2007  . HYPERTENSION 07/06/2007  . HYPOTHYROIDISM 07/06/2007  . Leg pain    ABIs 8/18:  Normal   . Obesity   . Severe aortic stenosis     Past Surgical History:  Procedure Laterality Date  . COLONOSCOPY    . COLONOSCOPY N/A 05/03/2020   Procedure: COLONOSCOPY;  Surgeon: Juanita Craver, MD;  Location: [facility] ENDOSCOPY;  Service: Endoscopy;  Laterality: N/A;  . COLONOSCOPY W/ BIOPSIES AND POLYPECTOMY    . ENTEROSCOPY N/A 05/02/2020   Procedure: ENTEROSCOPY;  Surgeon: Rush Landmark Telford Nab., MD;  Location: [facility];  Service: Gastroenterology;  Laterality: N/A;  . HEMOSTASIS CLIP PLACEMENT  05/03/2020   Procedure: HEMOSTASIS CLIP PLACEMENT;  Surgeon: Juanita Craver, MD;  Location: [facility] ENDOSCOPY;  Service: Endoscopy;;  . HOT HEMOSTASIS N/A 05/03/2020   Procedure: HOT HEMOSTASIS (ARGON PLASMA COAGULATION/BICAP);  Surgeon: Juanita Craver, MD;  Location: [facility];  Service: Endoscopy;  Laterality: N/A;  . IR IMAGING GUIDED PORT INSERTION  07/05/2018  . POLYPECTOMY    . RIGHT/LEFT HEART CATH AND CORONARY ANGIOGRAPHY N/A 02/13/2020   Procedure: RIGHT/LEFT HEART CATH AND CORONARY ANGIOGRAPHY;  Surgeon: Belva Crome, MD;  Location: [place] [facility];  Service: Cardiovascular;  Laterality: N/A;  . SUBMUCOSAL INJECTION  05/02/2020  Procedure: SUBMUCOSAL INJECTION;  Surgeon: Rush Landmark Telford Nab., MD;  Location: [facility];  Service: Gastroenterology;;      Inpatient Medications: Scheduled Meds: . abiraterone acetate  1,000 mg Oral Daily  . aspirin EC  81 mg Oral Daily  . atorvastatin  40 mg Oral q1800  . cloNIDine  0.2 mg Oral BID  . doxazosin  8 mg Oral QHS  .  influenza vaccine adjuvanted  0.5 mL Intramuscular Tomorrow-1000  . levothyroxine  150 mcg Oral Daily  . pantoprazole  40 mg Oral Daily  . pneumococcal 23 valent vaccine  0.5 mL Intramuscular Tomorrow-1000  . predniSONE  5 mg Oral Q breakfast  . sodium chloride flush  3 mL Intravenous Q12H   Continuous Infusions: . cefTRIAXone (ROCEPHIN)  IV     PRN Meds: metoprolol tartrate  Allergies:   No Known Allergies  Social History:   Social History   Socioeconomic History  . Marital status: Widowed    Spouse name: Not on file  . Number of children: Not on file  . Years of education: Not on file  . Highest education level: Not on file  Occupational History  . Not on file  Tobacco Use  . Smoking status: Former Research scientist (life sciences)  . Smokeless tobacco: Never Used  . Tobacco comment: quit 2005  Vaping Use  . Vaping Use: Never used  Substance and Sexual Activity  . Alcohol use: Not Currently    Alcohol/week: 21.0 standard drinks    Types: 21 Cans of beer per week  . Drug use: No  . Sexual activity: Not on file  Other Topics Concern  . Not on file  Social History Narrative   He works as a Freight forwarder    Not married    No kids          Social Determinants of Radio broadcast assistant Strain:   . Difficulty of Paying Living Expenses: Not on file  Food Insecurity:   . Worried About Charity fundraiser in the Last Year: Not on file  . Ran Out of Food in the Last Year: Not on file  Transportation Needs:   . Lack of Transportation (Medical): Not on file  . Lack of Transportation (Non-Medical): Not on file  Physical Activity:   . Days of Exercise per Week: Not on file  . Minutes of Exercise per Session: Not on file  Stress:   . Feeling of Stress : Not on file  Social Connections:   . Frequency of Communication with Friends and Family: Not on file  . Frequency of Social Gatherings with Friends and Family: Not on file  . Attends Religious Services: Not on file  . Active Member  of Clubs or Organizations: Not on file  . Attends Archivist Meetings: Not on file  . Marital Status: Not on file  Intimate Partner Violence:   . Fear of Current or Ex-Partner: Not on file  . Emotionally Abused: Not on file  . Physically Abused: Not on file  . Sexually Abused: Not on file    Family History:    Family History  Problem Relation Age of Onset  . Hypertension Other   . Breast cancer Sister   . Breast cancer Other        family history  . Breast cancer Brother        prostate ca  . Aneurysm Mother        Brain aneurysm   . Prostate cancer Father   .  Colon cancer Neg Hx   . Esophageal cancer Neg Hx   . Stomach cancer Neg Hx   . Rectal cancer Neg Hx      ROS:  Please see the history of present illness.  Patient complains of general malaise, fatigue, fevers and chills.  He continues to have black stool but is on iron. All other ROS reviewed and negative.     Physical Exam/Data:   Vitals:   05/14/20 0137 05/14/20 0408 05/14/20 0626 05/14/20 0751  BP: (!) 111/52 (!) 118/56 (!) 130/52 122/61  Pulse: 77 83 86 92  Resp:  15 15   Temp: 98.5 F (36.9 C) 98.8 F (37.1 C) 98.7 F (37.1 C)   TempSrc: Oral Oral Oral   SpO2: 97% 100% 100% 100%  Weight:   98.3 kg   Height:        Intake/Output Summary (Last 24 hours) at 05/14/2020 1034 Last data filed at 05/14/2020 0300 Gross per 24 hour  Intake 240 ml  Output 200 ml  Net 40 ml   Last 3 Weights 05/14/2020 05/13/2020 05/03/2020  Weight (lbs) 216 lb 11.4 oz 210 lb 210 lb  Weight (kg) 98.3 kg 95.255 kg 95.255 kg     Body mass index is 33.94 kg/m.  General:  Well nourished, well developed, in no acute distress HEENT: normal Lymph: no adenopathy Neck: no JVD Endocrine:  No thryomegaly Vascular: No carotid bruits; FA pulses 2+ bilaterally without bruits  Cardiac:  normal S1, S2; RRR; 3/6 systolic murmur and 2/6 diastolic murmur left sternal border. Lungs: Mildly diminished breath sounds at bases. Abd:  soft, nontender, no hepatomegaly  Ext: 1+ bilateral ankle edema Musculoskeletal:  No deformities, BUE and BLE strength normal and equal Skin: warm and dry  Neuro:  CNs 2-12 intact, no focal abnormalities noted Psych:  Normal affect   EKG:  The EKG was personally reviewed and demonstrates: Normal sinus rhythm with no ST changes and normal intervals. Telemetry:  Telemetry was personally reviewed and demonstrates: Sinus rhythm.  Laboratory Data:   Chemistry Recent Labs  Lab 05/11/20 0114 05/13/20 1204 05/14/20 0505  NA 137 133* 135  K 3.5 4.7 3.4*  CL 109 103 104  CO2 18* 16* 20*  GLUCOSE 100* 96 74  BUN 10 18 16   CREATININE 0.75 1.05 0.92  CALCIUM 8.3* 8.4* 7.7*  GFRNONAA >60 >60 >60  GFRAA >60 >60 >60  ANIONGAP 10 14 11     Recent Labs  Lab 05/11/20 0114 05/13/20 1204  PROT 5.7* 5.7*  ALBUMIN 2.5* 2.3*  AST 34 45*  ALT 40 37  ALKPHOS 71 84  BILITOT 0.8 0.7   Hematology Recent Labs  Lab 05/11/20 0114 05/13/20 1204 05/14/20 0505  WBC 15.0* 13.6* 12.2*  RBC 2.96* 2.83* 2.87*  HGB 8.2* 7.7* 7.7*  HCT 25.0* 24.0* 24.0*  MCV 84.5 84.8 83.6  MCH 27.7 27.2 26.8  MCHC 32.8 32.1 32.1  RDW 16.3* 16.5* 16.5*  PLT 266 240 245   BNP Recent Labs  Lab 05/13/20 1204  BNP 484.0*     Radiology/Studies:  DG Chest Port 1 View  Result Date: 05/13/2020 CLINICAL DATA:  Questionable sepsis. EXAM: PORTABLE CHEST 1 VIEW COMPARISON:  05/11/2020 FINDINGS: RIGHT-sided Port-A-Cath in stable position. Trachea midline.  Cardiomediastinal contours are stable. Mild central pulmonary vascular engorgement suggested with increasing opacities in the lung bases without signs of dense consolidative change. No definite effusion. On limited assessment skeletal structures without acute process. IMPRESSION: Mild central pulmonary vascular engorgement suggested  with increasing opacities in the lung bases without signs of dense consolidative change. Findings could represent atelectasis or  developing infection particularly at the RIGHT lung base. Electronically Signed   By: Zetta Bills M.D.   On: 05/13/2020 12:04   DG Chest Port 1 View  Result Date: 05/11/2020 CLINICAL DATA:  Lower extremity edema, sepsis EXAM: PORTABLE CHEST 1 VIEW COMPARISON:  05/02/2020 FINDINGS: Single frontal view of the chest demonstrates a stable right chest wall port. Eventration of the right hemidiaphragm is again noted. No airspace disease, effusion, or pneumothorax. Cardiac silhouette is stable. IMPRESSION: 1. Stable exam, no acute process. Electronically Signed   By: Randa Ngo M.D.   On: 05/11/2020 01:59   ECHOCARDIOGRAM COMPLETE  Result Date: 05/13/2020    ECHOCARDIOGRAM REPORT   Patient Name:   Matthew Serrano. Date of Exam: 05/13/2020 Medical Rec #:  [id number]          Height:       67.0 in Accession #:    [id number]         Weight:       210.0 lb Date of Birth:  1946/01/18         BSA:          2.065 m Patient Age:    39 years           BP:           103/44 mmHg Patient Gender: M                  HR:           76 bpm. Exam Location:  Inpatient Procedure: 2D Echo, Cardiac Doppler and Color Doppler Indications:    Bacteremia  History:        Patient has prior history of Echocardiogram examinations, most                 recent 02/05/2020. CHF, Aortic Valve Disease,                 Signs/Symptoms:Bacteremia and Resp. distress; Risk                 Factors:Dyslipidemia. Anemia.  Sonographer:    Dustin Flock Referring Phys: [id number] [place]  1. Left ventricular ejection fraction, by estimation, is 65 to 70%. The left ventricle has normal function. The left ventricle has no regional wall motion abnormalities. There is mild concentric left ventricular hypertrophy. Left ventricular diastolic parameters are consistent with Grade II diastolic dysfunction (pseudonormalization). Elevated left atrial pressure.  2. Right ventricular systolic function is normal. The right ventricular size is  normal. There is mildly elevated pulmonary artery systolic pressure.  3. Left atrial size was mildly dilated.  4. The mitral valve is normal in structure. Mild mitral valve regurgitation.  5. There is suspicion for a vegetation on both the left and the right coronary cusps and an accompanying eccentric jet of aortic insufficiency from the base of the right-left commisure. Cannot exclude a flail segment of the right cusp. The aortic valve is tricuspid. There is moderate calcification of the aortic valve. There is moderate thickening of the aortic valve. Aortic valve regurgitation is severe. Mild to moderate aortic valve stenosis. Comparison(s): Changes from prior study are noted. The aortic valve gradients are lower and the aortic leaflets appear more mobile. Unfortunately, this may be due to right aortic cusp destruction with a flail component and severe aortic insufficiency. Recommend TEE. FINDINGS  Left  Ventricle: Left ventricular ejection fraction, by estimation, is 65 to 70%. The left ventricle has normal function. The left ventricle has no regional wall motion abnormalities. The left ventricular internal cavity size was normal in size. There is  mild concentric left ventricular hypertrophy. Left ventricular diastolic parameters are consistent with Grade II diastolic dysfunction (pseudonormalization). Elevated left atrial pressure. Right Ventricle: The right ventricular size is normal. No increase in right ventricular wall thickness. Right ventricular systolic function is normal. There is mildly elevated pulmonary artery systolic pressure. The tricuspid regurgitant velocity is 3.07  m/s, and with an assumed right atrial pressure of 3 mmHg, the estimated right ventricular systolic pressure is 84.1 mmHg. Left Atrium: Left atrial size was mildly dilated. Right Atrium: Right atrial size was normal in size. Pericardium: There is no evidence of pericardial effusion. Mitral Valve: The mitral valve is normal in  structure. Mild mitral valve regurgitation, with centrally-directed jet. Tricuspid Valve: The tricuspid valve is normal in structure. Tricuspid valve regurgitation is mild. Aortic Valve: There is suspicion for a vegetation on both the left and the right coronary cusps and an accompanying eccentric jet of aortic insufficiency from the base of the right-left commisure. Cannot exclude a flail segment of the right cusp. The aortic valve is tricuspid. There is moderate calcification of the aortic valve. There is moderate thickening of the aortic valve. Aortic valve regurgitation is severe. Mild to moderate aortic stenosis is present. Aortic valve mean gradient measures 12.0 mmHg. Aortic valve peak gradient measures 22.1 mmHg. Aortic valve area, by VTI measures 1.90 cm. Pulmonic Valve: The pulmonic valve was normal in structure. Pulmonic valve regurgitation is not visualized. Aorta: The aortic root is normal in size and structure. IAS/Shunts: No atrial level shunt detected by color flow Doppler.  LEFT VENTRICLE PLAX 2D LVIDd:         5.30 cm  Diastology LVIDs:         3.30 cm  LV e' medial:    6.20 cm/s LV PW:         1.60 cm  LV E/e' medial:  20.5 LV IVS:        1.50 cm  LV e' lateral:   8.70 cm/s LVOT diam:     2.20 cm  LV E/e' lateral: 14.6 LV SV:         93 LV SV Index:   45 LVOT Area:     3.80 cm  RIGHT VENTRICLE RV Basal diam:  3.30 cm RV S prime:     21.30 cm/s TAPSE (M-mode): 3.2 cm LEFT ATRIUM             Index       RIGHT ATRIUM           Index LA diam:        4.30 cm 2.08 cm/m  RA Area:     17.20 cm LA Vol (A2C):   62.3 ml 30.18 ml/m RA Volume:   47.00 ml  22.76 ml/m LA Vol (A4C):   52.0 ml 25.19 ml/m LA Biplane Vol: 57.8 ml 28.00 ml/m  AORTIC VALVE AV Area (Vmax):    1.76 cm AV Area (Vmean):   1.77 cm AV Area (VTI):     1.90 cm AV Vmax:           235.25 cm/s AV Vmean:          160.000 cm/s AV VTI:            0.487 m AV Peak Grad:  22.1 mmHg AV Mean Grad:      12.0 mmHg LVOT Vmax:         109.00  cm/s LVOT Vmean:        74.700 cm/s LVOT VTI:          0.244 m LVOT/AV VTI ratio: 0.50  AORTA Ao Root diam: 3.30 cm MITRAL VALVE                TRICUSPID VALVE MV Area (PHT): 7.66 cm     TR Peak grad:   37.7 mmHg MV Decel Time: 99 msec      TR Vmax:        307.00 cm/s MV E velocity: 127.00 cm/s MV A velocity: 82.00 cm/s   SHUNTS MV E/A ratio:  1.55         Systemic VTI:  0.24 m                             Systemic Diam: 2.20 cm [name] Croitoru MD Electronically signed by Sanda Klein MD Signature Date/Time: 05/13/2020/4:52:20 PM    Final    CT Renal Stone Study  Result Date: 05/11/2020 CLINICAL DATA:  Flank pain, lower extremity swelling, history of prostate cancer EXAM: CT ABDOMEN AND PELVIS WITHOUT CONTRAST TECHNIQUE: Multidetector CT imaging of the abdomen and pelvis was performed following the standard protocol without IV contrast. COMPARISON:  02/20/2020, 06/12/2018 FINDINGS: Lower chest: Trace right pleural effusion. Mild right basilar atelectasis. Extensive coronary artery calcification. Global cardiac size within normal limits. There are extensive calcifications of the aortic valve leaflets. Small pericardial effusion is present without CT evidence of cardiac tamponade. This is slightly enlarged when compared to prior examination. Hepatobiliary: No focal liver abnormality is seen. No gallstones, gallbladder wall thickening, or biliary dilatation. Pancreas: Unremarkable Spleen: Unremarkable Adrenals/Urinary Tract: Nodule within the a lateral limb of the right adrenal gland is unchanged. Nodular thickening of the left adrenal gland is unchanged. The kidneys are unremarkable. The bladder is unremarkable. Stomach/Bowel: Mild pancolonic diverticulosis. Endoscopic clips are seen within the cecum. The stomach, small bowel, and large bowel are otherwise unremarkable. Appendix normal. No free intraperitoneal gas or fluid. Vascular/Lymphatic: Moderate aortoiliac atherosclerotic calcification without evidence of  aneurysm. Particularly prominent calcification is seen within the proximal renal arteries bilaterally as well as the mid SMA, however, the degree of stenosis is not well assessed on this non arteriographic examination. No aortic aneurysm. Stable bilateral pelvic sidewall adenopathy with lymph nodes measuring up to 12 mm in short axis diameter at axial image # 69. No other pathologic adenopathy within the abdomen and pelvis. These likely represent the residua of massive lymphadenopathy seen on remote prior examination. Reproductive: Prostate is normal in size, decreased since remote prior examination. Mild residual asymmetric enlargement of the left seminal vesicle. Other: Rectum unremarkable. 21 mm intramuscular lipoma within the right tensor fascia lata. Small fat containing left inguinal hernia. Musculoskeletal: No lytic or blastic bone lesions are seen. IMPRESSION: Extensive coronary artery calcification. Extensive calcifications of the aortic valve leaflets. Slight interval enlargement in small pericardial effusion. No definite radiographic explanation for the patient's reported flank pain. Stable bilateral pelvic sidewall lymphadenopathy, likely the residua of a prior pathologic adenopathy related to the patient's underlying prostate cancer noted on remote prior examination. Peripheral vascular disease. If there is clinical suspicion of hemodynamically significant stenosis of the renal arteries (refractory hypertension, renal insufficiency), this could be better assessed with CT arteriography. Aortic Atherosclerosis (ICD10-I70.0). Electronically  Signed   By: Fidela Salisbury MD   On: 05/11/2020 04:26     Assessment and Plan:   1. SBE-patient presents with bacteremia (strep species), fevers and chills and transthoracic echocardiogram shows possible vegetations on right and left coronary cusps with mild to moderate aortic stenosis and severe aortic insufficiency.  Aortic stenosis has actually improved  compared to previous echocardiogram and this is felt possibly secondary to destruction of the right cusp of the aortic valve.  Continue antibiotics.  Will arrange transesophageal echocardiogram today to further assess aortic insufficiency, rule out abscess and rule out involvement of other valves.  He likely has acute severe aortic insufficiency and is symptomatic.  He will need CVTS consult for possible valve replacement. 2. Acute diastolic congestive heart failure-CHF is likely secondary to acute aortic insufficiency.  We will treat with Lasix 40 mg IV twice daily.  Follow renal function closely.  As above needs TEE and CVTS consult for possible aortic valve replacement. 3. Recent GI bleed-EGD September 10 unremarkable.  Colonoscopy showed diverticulosis and multiple nonbleeding cecal angiectasia treated with APC.  Follow hemoglobin.  Transfuse as needed. 4. Hypertension-blood pressure is borderline.  Discontinue clonidine and follow. 5. History of prostate cancer 6. Nonobstructive coronary artery disease-based on recent cardiac catheterization.  Patient is not having chest pain.  Continue medical therapy with 81 mg aspirin daily and statin.  For questions or updates, please contact [place] Please consult [email or web address] for contact info under    Signed, Kirk Ruths, MD  05/14/2020 10:34 AM\

## 2020-05-14 NOTE — Anesthesia Postprocedure Evaluation (Signed)
Anesthesia Post Note  Patient: Matthew Serrano.  Procedure(s) Performed: TRANSESOPHAGEAL ECHOCARDIOGRAM (TEE) (N/A )     Patient location during evaluation: PACU Anesthesia Type: MAC Level of consciousness: awake and alert Pain management: pain level controlled Vital Signs Assessment: post-procedure vital signs reviewed and stable Respiratory status: spontaneous breathing, nonlabored ventilation, respiratory function stable and patient connected to nasal cannula oxygen Cardiovascular status: stable and blood pressure returned to baseline Postop Assessment: no apparent nausea or vomiting Anesthetic complications: no   No complications documented.  Last Vitals:  Vitals:   05/14/20 1445 05/14/20 1505  BP: (!) 129/50 (!) 125/50  Pulse: (!) 101 100  Resp: (!) 24 10  Temp:    SpO2: 97% 98%    Last Pain:  Vitals:   05/14/20 1445  TempSrc:   PainSc: 0-No pain                 Belenda Cruise P Perfecto Purdy

## 2020-05-14 NOTE — Progress Notes (Signed)
Hypoglycemic Event  CBG: 42  Treatment: D50 25 mL (12.5 gm)  Symptoms: Sweaty  Follow-up CBG: Time:0221   CBG Result:  120  Possible Reasons for Event: Inadequate meal intake NPO  Comments/MD notified: D> Cherre Huger

## 2020-05-14 NOTE — Progress Notes (Signed)
Patient ID: Matthew Agerton., male   DOB: June 22, 1946, 74 y.o.   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  Repeat blood cultures are pending.  I spoke with Dr. Roxy Manns who plans to take him to the OR in the morning for aortic valve replacement and aortic root reconstruction.  I will continue ceftriaxone pending antibiotic susceptibility results.  Strep anginosis is generally very susceptible to beta-lactam antibiotics.         Michel Bickers, MD [facility] [phone] pager   [phone] cell 05/14/2020, 4:11 PM

## 2020-05-14 NOTE — H&P (View-Only) (Signed)
Cardiology Consultation:   Patient ID: Matthew Serrano. MRN: [id number]; DOB: 11/26/1945  Admit date: 05/13/2020 Date of Consult: 05/14/2020  Primary Care Provider: [name], NP [facility] Cardiologist: [name], MD     Patient Profile:   Matthew Enberg. is a 74 y.o. male with a hx of severe aortic stenosis, hypertension, hyperlipidemia, diabetes mellitus, prostate cancer, recent GI bleed who is being seen today for the evaluation of subacute bacterial endocarditis at the request of Dessa Phi, DO.  History of Present Illness:   Patient has been followed for aortic stenosis.  Echocardiogram June 2021 showed normal LV function, mild left ventricular hypertrophy, grade 1 diastolic dysfunction, severe aortic stenosis with mean gradient 44 mmHg, mild left atrial enlargement, mild mitral regurgitation.  Cardiac catheterization June 2021 showed nonobstructive coronary disease with 40 to 50% mid to distal LAD, 50% mid RCA, 50% mid circumflex and normal right heart pressures.  Aortic valve mean gradient 28 mmHg.  Plan was for TAVR.  However patient recently admitted with GI bleed.  He required transfusion.  EGD September 10 unremarkable.  Colonoscopy showed diverticulosis and multiple nonbleeding cecal angiectasia treated with APC.  Patient did have a fever during the hospitalization and blood cultures were drawn.  He also had urine cultures that returned positive for E. coli and Klebsiella and was treated with Bactrim.  His blood cultures returned with gram-positive cocci (strep species).  He was therefore asked to return to the hospital.  Patient does describe fevers and chills over the past 5 days.  He also notes increasing dyspnea on exertion for 48 hours but denies orthopnea or PND.  He has noticed increased pedal edema.  He denies chest pain or weight loss.  He has had no recent dental procedures.  Cardiology now asked to evaluate.   Past Medical History:  Diagnosis Date    . AVM (arteriovenous malformation) of colon   . Cancer [facility])    prostate cancer  . DIABETES MELLITUS, TYPE II 07/06/2007  . ED (erectile dysfunction)   . Elevated PSA 01/20/2012  . Elevated PSA   . History of nuclear stress test    Myoview 8/18: EF 64, inferobasal thinning, no ischemia, low risk  . HYPERLIPIDEMIA 07/06/2007  . HYPERTENSION 07/06/2007  . HYPOTHYROIDISM 07/06/2007  . Leg pain    ABIs 8/18:  Normal   . Obesity   . Severe aortic stenosis     Past Surgical History:  Procedure Laterality Date  . COLONOSCOPY    . COLONOSCOPY N/A 05/03/2020   Procedure: COLONOSCOPY;  Surgeon: Juanita Craver, MD;  Location: [facility] ENDOSCOPY;  Service: Endoscopy;  Laterality: N/A;  . COLONOSCOPY W/ BIOPSIES AND POLYPECTOMY    . ENTEROSCOPY N/A 05/02/2020   Procedure: ENTEROSCOPY;  Surgeon: Rush Landmark Telford Nab., MD;  Location: [facility];  Service: Gastroenterology;  Laterality: N/A;  . HEMOSTASIS CLIP PLACEMENT  05/03/2020   Procedure: HEMOSTASIS CLIP PLACEMENT;  Surgeon: Juanita Craver, MD;  Location: [facility] ENDOSCOPY;  Service: Endoscopy;;  . HOT HEMOSTASIS N/A 05/03/2020   Procedure: HOT HEMOSTASIS (ARGON PLASMA COAGULATION/BICAP);  Surgeon: Juanita Craver, MD;  Location: [facility];  Service: Endoscopy;  Laterality: N/A;  . IR IMAGING GUIDED PORT INSERTION  07/05/2018  . POLYPECTOMY    . RIGHT/LEFT HEART CATH AND CORONARY ANGIOGRAPHY N/A 02/13/2020   Procedure: RIGHT/LEFT HEART CATH AND CORONARY ANGIOGRAPHY;  Surgeon: Belva Crome, MD;  Location: [place] [facility];  Service: Cardiovascular;  Laterality: N/A;  . SUBMUCOSAL INJECTION  05/02/2020  Procedure: SUBMUCOSAL INJECTION;  Surgeon: Rush Landmark Telford Nab., MD;  Location: [facility];  Service: Gastroenterology;;      Inpatient Medications: Scheduled Meds: . abiraterone acetate  1,000 mg Oral Daily  . aspirin EC  81 mg Oral Daily  . atorvastatin  40 mg Oral q1800  . cloNIDine  0.2 mg Oral BID  . doxazosin  8 mg Oral QHS  .  influenza vaccine adjuvanted  0.5 mL Intramuscular Tomorrow-1000  . levothyroxine  150 mcg Oral Daily  . pantoprazole  40 mg Oral Daily  . pneumococcal 23 valent vaccine  0.5 mL Intramuscular Tomorrow-1000  . predniSONE  5 mg Oral Q breakfast  . sodium chloride flush  3 mL Intravenous Q12H   Continuous Infusions: . cefTRIAXone (ROCEPHIN)  IV     PRN Meds: metoprolol tartrate  Allergies:   No Known Allergies  Social History:   Social History   Socioeconomic History  . Marital status: Widowed    Spouse name: Not on file  . Number of children: Not on file  . Years of education: Not on file  . Highest education level: Not on file  Occupational History  . Not on file  Tobacco Use  . Smoking status: Former Research scientist (life sciences)  . Smokeless tobacco: Never Used  . Tobacco comment: quit 2005  Vaping Use  . Vaping Use: Never used  Substance and Sexual Activity  . Alcohol use: Not Currently    Alcohol/week: 21.0 standard drinks    Types: 21 Cans of beer per week  . Drug use: No  . Sexual activity: Not on file  Other Topics Concern  . Not on file  Social History Narrative   He works as a Freight forwarder    Not married    No kids          Social Determinants of Radio broadcast assistant Strain:   . Difficulty of Paying Living Expenses: Not on file  Food Insecurity:   . Worried About Charity fundraiser in the Last Year: Not on file  . Ran Out of Food in the Last Year: Not on file  Transportation Needs:   . Lack of Transportation (Medical): Not on file  . Lack of Transportation (Non-Medical): Not on file  Physical Activity:   . Days of Exercise per Week: Not on file  . Minutes of Exercise per Session: Not on file  Stress:   . Feeling of Stress : Not on file  Social Connections:   . Frequency of Communication with Friends and Family: Not on file  . Frequency of Social Gatherings with Friends and Family: Not on file  . Attends Religious Services: Not on file  . Active Member  of Clubs or Organizations: Not on file  . Attends Archivist Meetings: Not on file  . Marital Status: Not on file  Intimate Partner Violence:   . Fear of Current or Ex-Partner: Not on file  . Emotionally Abused: Not on file  . Physically Abused: Not on file  . Sexually Abused: Not on file    Family History:    Family History  Problem Relation Age of Onset  . Hypertension Other   . Breast cancer Sister   . Breast cancer Other        family history  . Breast cancer Brother        prostate ca  . Aneurysm Mother        Brain aneurysm   . Prostate cancer Father   .  Colon cancer Neg Hx   . Esophageal cancer Neg Hx   . Stomach cancer Neg Hx   . Rectal cancer Neg Hx      ROS:  Please see the history of present illness.  Patient complains of general malaise, fatigue, fevers and chills.  He continues to have black stool but is on iron. All other ROS reviewed and negative.     Physical Exam/Data:   Vitals:   05/14/20 0137 05/14/20 0408 05/14/20 0626 05/14/20 0751  BP: (!) 111/52 (!) 118/56 (!) 130/52 122/61  Pulse: 77 83 86 92  Resp:  15 15   Temp: 98.5 F (36.9 C) 98.8 F (37.1 C) 98.7 F (37.1 C)   TempSrc: Oral Oral Oral   SpO2: 97% 100% 100% 100%  Weight:   98.3 kg   Height:        Intake/Output Summary (Last 24 hours) at 05/14/2020 1034 Last data filed at 05/14/2020 0300 Gross per 24 hour  Intake 240 ml  Output 200 ml  Net 40 ml   Last 3 Weights 05/14/2020 05/13/2020 05/03/2020  Weight (lbs) 216 lb 11.4 oz 210 lb 210 lb  Weight (kg) 98.3 kg 95.255 kg 95.255 kg     Body mass index is 33.94 kg/m.  General:  Well nourished, well developed, in no acute distress HEENT: normal Lymph: no adenopathy Neck: no JVD Endocrine:  No thryomegaly Vascular: No carotid bruits; FA pulses 2+ bilaterally without bruits  Cardiac:  normal S1, S2; RRR; 3/6 systolic murmur and 2/6 diastolic murmur left sternal border. Lungs: Mildly diminished breath sounds at bases. Abd:  soft, nontender, no hepatomegaly  Ext: 1+ bilateral ankle edema Musculoskeletal:  No deformities, BUE and BLE strength normal and equal Skin: warm and dry  Neuro:  CNs 2-12 intact, no focal abnormalities noted Psych:  Normal affect   EKG:  The EKG was personally reviewed and demonstrates: Normal sinus rhythm with no ST changes and normal intervals. Telemetry:  Telemetry was personally reviewed and demonstrates: Sinus rhythm.  Laboratory Data:   Chemistry Recent Labs  Lab 05/11/20 0114 05/13/20 1204 05/14/20 0505  NA 137 133* 135  K 3.5 4.7 3.4*  CL 109 103 104  CO2 18* 16* 20*  GLUCOSE 100* 96 74  BUN 10 18 16   CREATININE 0.75 1.05 0.92  CALCIUM 8.3* 8.4* 7.7*  GFRNONAA >60 >60 >60  GFRAA >60 >60 >60  ANIONGAP 10 14 11     Recent Labs  Lab 05/11/20 0114 05/13/20 1204  PROT 5.7* 5.7*  ALBUMIN 2.5* 2.3*  AST 34 45*  ALT 40 37  ALKPHOS 71 84  BILITOT 0.8 0.7   Hematology Recent Labs  Lab 05/11/20 0114 05/13/20 1204 05/14/20 0505  WBC 15.0* 13.6* 12.2*  RBC 2.96* 2.83* 2.87*  HGB 8.2* 7.7* 7.7*  HCT 25.0* 24.0* 24.0*  MCV 84.5 84.8 83.6  MCH 27.7 27.2 26.8  MCHC 32.8 32.1 32.1  RDW 16.3* 16.5* 16.5*  PLT 266 240 245   BNP Recent Labs  Lab 05/13/20 1204  BNP 484.0*     Radiology/Studies:  DG Chest Port 1 View  Result Date: 05/13/2020 CLINICAL DATA:  Questionable sepsis. EXAM: PORTABLE CHEST 1 VIEW COMPARISON:  05/11/2020 FINDINGS: RIGHT-sided Port-A-Cath in stable position. Trachea midline.  Cardiomediastinal contours are stable. Mild central pulmonary vascular engorgement suggested with increasing opacities in the lung bases without signs of dense consolidative change. No definite effusion. On limited assessment skeletal structures without acute process. IMPRESSION: Mild central pulmonary vascular engorgement suggested  with increasing opacities in the lung bases without signs of dense consolidative change. Findings could represent atelectasis or  developing infection particularly at the RIGHT lung base. Electronically Signed   By: Zetta Bills M.D.   On: 05/13/2020 12:04   DG Chest Port 1 View  Result Date: 05/11/2020 CLINICAL DATA:  Lower extremity edema, sepsis EXAM: PORTABLE CHEST 1 VIEW COMPARISON:  05/02/2020 FINDINGS: Single frontal view of the chest demonstrates a stable right chest wall port. Eventration of the right hemidiaphragm is again noted. No airspace disease, effusion, or pneumothorax. Cardiac silhouette is stable. IMPRESSION: 1. Stable exam, no acute process. Electronically Signed   By: Randa Ngo M.D.   On: 05/11/2020 01:59   ECHOCARDIOGRAM COMPLETE  Result Date: 05/13/2020    ECHOCARDIOGRAM REPORT   Patient Name:   Matthew Calender. Date of Exam: 05/13/2020 Medical Rec #:  [id number]          Height:       67.0 in Accession #:    [id number]         Weight:       210.0 lb Date of Birth:  07-13-1946         BSA:          2.065 m Patient Age:    81 years           BP:           103/44 mmHg Patient Gender: M                  HR:           76 bpm. Exam Location:  Inpatient Procedure: 2D Echo, Cardiac Doppler and Color Doppler Indications:    Bacteremia  History:        Patient has prior history of Echocardiogram examinations, most                 recent 02/05/2020. CHF, Aortic Valve Disease,                 Signs/Symptoms:Bacteremia and Resp. distress; Risk                 Factors:Dyslipidemia. Anemia.  Sonographer:    Dustin Flock Referring Phys: [id number] [place]  1. Left ventricular ejection fraction, by estimation, is 65 to 70%. The left ventricle has normal function. The left ventricle has no regional wall motion abnormalities. There is mild concentric left ventricular hypertrophy. Left ventricular diastolic parameters are consistent with Grade II diastolic dysfunction (pseudonormalization). Elevated left atrial pressure.  2. Right ventricular systolic function is normal. The right ventricular size is  normal. There is mildly elevated pulmonary artery systolic pressure.  3. Left atrial size was mildly dilated.  4. The mitral valve is normal in structure. Mild mitral valve regurgitation.  5. There is suspicion for a vegetation on both the left and the right coronary cusps and an accompanying eccentric jet of aortic insufficiency from the base of the right-left commisure. Cannot exclude a flail segment of the right cusp. The aortic valve is tricuspid. There is moderate calcification of the aortic valve. There is moderate thickening of the aortic valve. Aortic valve regurgitation is severe. Mild to moderate aortic valve stenosis. Comparison(s): Changes from prior study are noted. The aortic valve gradients are lower and the aortic leaflets appear more mobile. Unfortunately, this may be due to right aortic cusp destruction with a flail component and severe aortic insufficiency. Recommend TEE. FINDINGS  Left  Ventricle: Left ventricular ejection fraction, by estimation, is 65 to 70%. The left ventricle has normal function. The left ventricle has no regional wall motion abnormalities. The left ventricular internal cavity size was normal in size. There is  mild concentric left ventricular hypertrophy. Left ventricular diastolic parameters are consistent with Grade II diastolic dysfunction (pseudonormalization). Elevated left atrial pressure. Right Ventricle: The right ventricular size is normal. No increase in right ventricular wall thickness. Right ventricular systolic function is normal. There is mildly elevated pulmonary artery systolic pressure. The tricuspid regurgitant velocity is 3.07  m/s, and with an assumed right atrial pressure of 3 mmHg, the estimated right ventricular systolic pressure is 93.5 mmHg. Left Atrium: Left atrial size was mildly dilated. Right Atrium: Right atrial size was normal in size. Pericardium: There is no evidence of pericardial effusion. Mitral Valve: The mitral valve is normal in  structure. Mild mitral valve regurgitation, with centrally-directed jet. Tricuspid Valve: The tricuspid valve is normal in structure. Tricuspid valve regurgitation is mild. Aortic Valve: There is suspicion for a vegetation on both the left and the right coronary cusps and an accompanying eccentric jet of aortic insufficiency from the base of the right-left commisure. Cannot exclude a flail segment of the right cusp. The aortic valve is tricuspid. There is moderate calcification of the aortic valve. There is moderate thickening of the aortic valve. Aortic valve regurgitation is severe. Mild to moderate aortic stenosis is present. Aortic valve mean gradient measures 12.0 mmHg. Aortic valve peak gradient measures 22.1 mmHg. Aortic valve area, by VTI measures 1.90 cm. Pulmonic Valve: The pulmonic valve was normal in structure. Pulmonic valve regurgitation is not visualized. Aorta: The aortic root is normal in size and structure. IAS/Shunts: No atrial level shunt detected by color flow Doppler.  LEFT VENTRICLE PLAX 2D LVIDd:         5.30 cm  Diastology LVIDs:         3.30 cm  LV e' medial:    6.20 cm/s LV PW:         1.60 cm  LV E/e' medial:  20.5 LV IVS:        1.50 cm  LV e' lateral:   8.70 cm/s LVOT diam:     2.20 cm  LV E/e' lateral: 14.6 LV SV:         93 LV SV Index:   45 LVOT Area:     3.80 cm  RIGHT VENTRICLE RV Basal diam:  3.30 cm RV S prime:     21.30 cm/s TAPSE (M-mode): 3.2 cm LEFT ATRIUM             Index       RIGHT ATRIUM           Index LA diam:        4.30 cm 2.08 cm/m  RA Area:     17.20 cm LA Vol (A2C):   62.3 ml 30.18 ml/m RA Volume:   47.00 ml  22.76 ml/m LA Vol (A4C):   52.0 ml 25.19 ml/m LA Biplane Vol: 57.8 ml 28.00 ml/m  AORTIC VALVE AV Area (Vmax):    1.76 cm AV Area (Vmean):   1.77 cm AV Area (VTI):     1.90 cm AV Vmax:           235.25 cm/s AV Vmean:          160.000 cm/s AV VTI:            0.487 m AV Peak Grad:  22.1 mmHg AV Mean Grad:      12.0 mmHg LVOT Vmax:         109.00  cm/s LVOT Vmean:        74.700 cm/s LVOT VTI:          0.244 m LVOT/AV VTI ratio: 0.50  AORTA Ao Root diam: 3.30 cm MITRAL VALVE                TRICUSPID VALVE MV Area (PHT): 7.66 cm     TR Peak grad:   37.7 mmHg MV Decel Time: 99 msec      TR Vmax:        307.00 cm/s MV E velocity: 127.00 cm/s MV A velocity: 82.00 cm/s   SHUNTS MV E/A ratio:  1.55         Systemic VTI:  0.24 m                             Systemic Diam: 2.20 cm [name] Croitoru MD Electronically signed by Sanda Klein MD Signature Date/Time: 05/13/2020/4:52:20 PM    Final    CT Renal Stone Study  Result Date: 05/11/2020 CLINICAL DATA:  Flank pain, lower extremity swelling, history of prostate cancer EXAM: CT ABDOMEN AND PELVIS WITHOUT CONTRAST TECHNIQUE: Multidetector CT imaging of the abdomen and pelvis was performed following the standard protocol without IV contrast. COMPARISON:  02/20/2020, 06/12/2018 FINDINGS: Lower chest: Trace right pleural effusion. Mild right basilar atelectasis. Extensive coronary artery calcification. Global cardiac size within normal limits. There are extensive calcifications of the aortic valve leaflets. Small pericardial effusion is present without CT evidence of cardiac tamponade. This is slightly enlarged when compared to prior examination. Hepatobiliary: No focal liver abnormality is seen. No gallstones, gallbladder wall thickening, or biliary dilatation. Pancreas: Unremarkable Spleen: Unremarkable Adrenals/Urinary Tract: Nodule within the a lateral limb of the right adrenal gland is unchanged. Nodular thickening of the left adrenal gland is unchanged. The kidneys are unremarkable. The bladder is unremarkable. Stomach/Bowel: Mild pancolonic diverticulosis. Endoscopic clips are seen within the cecum. The stomach, small bowel, and large bowel are otherwise unremarkable. Appendix normal. No free intraperitoneal gas or fluid. Vascular/Lymphatic: Moderate aortoiliac atherosclerotic calcification without evidence of  aneurysm. Particularly prominent calcification is seen within the proximal renal arteries bilaterally as well as the mid SMA, however, the degree of stenosis is not well assessed on this non arteriographic examination. No aortic aneurysm. Stable bilateral pelvic sidewall adenopathy with lymph nodes measuring up to 12 mm in short axis diameter at axial image # 69. No other pathologic adenopathy within the abdomen and pelvis. These likely represent the residua of massive lymphadenopathy seen on remote prior examination. Reproductive: Prostate is normal in size, decreased since remote prior examination. Mild residual asymmetric enlargement of the left seminal vesicle. Other: Rectum unremarkable. 21 mm intramuscular lipoma within the right tensor fascia lata. Small fat containing left inguinal hernia. Musculoskeletal: No lytic or blastic bone lesions are seen. IMPRESSION: Extensive coronary artery calcification. Extensive calcifications of the aortic valve leaflets. Slight interval enlargement in small pericardial effusion. No definite radiographic explanation for the patient's reported flank pain. Stable bilateral pelvic sidewall lymphadenopathy, likely the residua of a prior pathologic adenopathy related to the patient's underlying prostate cancer noted on remote prior examination. Peripheral vascular disease. If there is clinical suspicion of hemodynamically significant stenosis of the renal arteries (refractory hypertension, renal insufficiency), this could be better assessed with CT arteriography. Aortic Atherosclerosis (ICD10-I70.0). Electronically  Signed   By: Fidela Salisbury MD   On: 05/11/2020 04:26     Assessment and Plan:   1. SBE-patient presents with bacteremia (strep species), fevers and chills and transthoracic echocardiogram shows possible vegetations on right and left coronary cusps with mild to moderate aortic stenosis and severe aortic insufficiency.  Aortic stenosis has actually improved  compared to previous echocardiogram and this is felt possibly secondary to destruction of the right cusp of the aortic valve.  Continue antibiotics.  Will arrange transesophageal echocardiogram today to further assess aortic insufficiency, rule out abscess and rule out involvement of other valves.  He likely has acute severe aortic insufficiency and is symptomatic.  He will need CVTS consult for possible valve replacement. 2. Acute diastolic congestive heart failure-CHF is likely secondary to acute aortic insufficiency.  We will treat with Lasix 40 mg IV twice daily.  Follow renal function closely.  As above needs TEE and CVTS consult for possible aortic valve replacement. 3. Recent GI bleed-EGD September 10 unremarkable.  Colonoscopy showed diverticulosis and multiple nonbleeding cecal angiectasia treated with APC.  Follow hemoglobin.  Transfuse as needed. 4. Hypertension-blood pressure is borderline.  Discontinue clonidine and follow. 5. History of prostate cancer 6. Nonobstructive coronary artery disease-based on recent cardiac catheterization.  Patient is not having chest pain.  Continue medical therapy with 81 mg aspirin daily and statin.  For questions or updates, please contact [place] Please consult [email or web address] for contact info under    Signed, Kirk Ruths, MD  05/14/2020 10:34 AM\

## 2020-05-14 NOTE — Progress Notes (Signed)
Pt transferred to [facility] for TEE via [facility] in stable condition. RAC IV site WDL and patent. VSS. Pt and family updated on plan of care. Verbalized understanding.

## 2020-05-14 NOTE — Interval H&P Note (Signed)
History and Physical Interval Note:  05/14/2020 12:44 PM  Matthew Serrano.  has presented today for surgery, with the diagnosis of endocarditis.  The various methods of treatment have been discussed with the patient and family. After consideration of risks, benefits and other options for treatment, the patient has consented to  Procedure(s): TRANSESOPHAGEAL ECHOCARDIOGRAM (TEE) (N/A) as a surgical intervention.  The patient's history has been reviewed, patient examined, no change in status, stable for surgery.  I have reviewed the patient's chart and labs.  Questions were answered to the patient's satisfaction.     [name]

## 2020-05-14 NOTE — Transfer of Care (Signed)
Immediate Anesthesia Transfer of Care Note  Patient: Matthew Serrano.  Procedure(s) Performed: TRANSESOPHAGEAL ECHOCARDIOGRAM (TEE) (N/A )  Patient Location: PACU  Anesthesia Type:MAC  Level of Consciousness: awake, alert , oriented and patient cooperative  Airway & Oxygen Therapy: Patient Spontanous Breathing and Patient connected to face mask oxygen  Post-op Assessment: Report given to RN and Post -op Vital signs reviewed and stable  Post vital signs: Reviewed and stable  Last Vitals:  Vitals Value Taken Time  BP 112/42 05/14/20 1405  Temp    Pulse 93 05/14/20 1406  Resp 20 05/14/20 1406  SpO2 99 % 05/14/20 1406  Vitals shown include unvalidated device data.  Last Pain:  Vitals:   05/14/20 1258  TempSrc: Oral  PainSc: 0-No pain      Patients Stated Pain Goal: 5 (25/61/54 8845)  Complications: No complications documented.

## 2020-05-14 NOTE — Progress Notes (Signed)
PROGRESS NOTE    Matthew Serrano.  SEG:[id number] DOB: 04-07-1946 DOA: 05/13/2020 PCP: [name], NP     Brief Narrative:  Matthew Serrano. is a 74 year old male with past medical history of prostate cancer on oral hormone therapy (abiraterone acetate), type 2 diabetes, hypertension, hyperlipidemia, AVM of colon, severe AS, HFpEF, hypothyroidism with multiple hospitalizations recently who presented today with abnormal lab work: Strep species bacteremia.   He was recently hospitalized from 9/10-9/12 for symptomatic blood loss anemia secondary to GI bleed.  He then returned to the ED on 9/19 with generalized weakness and difficulty walking with one fall but no syncope and fever.  At that time he had hypomagnesemia which was repleted, and unremarkable CT scan and had labs drawn and was discharged from the ED with Bactrim for presumed UTI.  Then his blood cultures resulted positive for Streptococcus species (not Enterococcus, Streptococcus agalactiae, strep pyogenes or strep pneumoniae) and was contacted by the ED to return to the hospital for further work-up.  New events last 24 hours / Subjective: He denies any chest pain or worsening shortness of breath on examination today.  Denies nausea, vomiting, abdominal pain.  Assessment & Plan:   Principal Problem:   Bacteremia Active Problems:   Hypothyroidism   Diabetes mellitus ([place])   Hyperlipidemia   Essential hypertension   Aortic stenosis   Malignant neoplasm of prostate (HCC)   Acute CHF (congestive heart failure) (HCC)   Respiratory distress   Endocarditis    Sepsis secondary to Streptococcus species bacteremia, endocarditis -Infectious disease following, remains on ceftriaxone -Repeat blood cultures drawn 9/21 pending -Scheduled for TEE 9/22   Acute on chronic diastolic heart failure exacerbation, likely secondary to acute aortic insufficiency -Cardiology following -Continue IV Lasix -Cardiothoracic surgery consult  for possible aortic valve replacement  Hypertension -Hold clonidine for now  AKI -Baseline creatinine 0.6-0.7 -Improving  Diabetes mellitus type 2 -Well-controlled, hemoglobin A1c 6.8  CAD -Continue aspirin, statin  Hypokalemia -Replaced  Recent GI bleed secondary to AVM -Evaluated by GI and underwent small bowel endoscopy, colonoscopy 9/10 and 9/11 respectively  History of prostate cancer -Continue Zytiga, prednisone  Hypothyroidism -Continue Synthroid    DVT prophylaxis:  Place and maintain sequential compression device Start: 05/13/20 1530 SCDs Start: 05/13/20 1529  Code Status: DNR Family Communication: Family friend at bedside Disposition Plan:  Status is: Inpatient  Remains inpatient appropriate because:Ongoing diagnostic testing needed not appropriate for outpatient work up   Dispo: The patient is from: Home              Anticipated d/c is to: Home              Anticipated d/c date is: > 3 days              Patient currently is not medically stable to d/c.  Plan to transfer to [facility] for TEE and cardiothoracic surgery evaluation today.    Consultants:   Infectious disease  Cardiology  Cardiothoracic surgery  Procedures:   TEE 9/22  Antimicrobials:  Anti-infectives (From admission, onward)   Start     Dose/Rate Route Frequency Ordered Stop   05/14/20 1200  cefTRIAXone (ROCEPHIN) 2 g in sodium chloride 0.9 % 100 mL IVPB        2 g 200 mL/hr over 30 Minutes Intravenous Every 24 hours 05/13/20 1528     05/13/20 1245  azithromycin (ZITHROMAX) 500 mg in sodium chloride 0.9 % 250 mL IVPB  500 mg 250 mL/hr over 60 Minutes Intravenous  Once 05/13/20 1240 05/13/20 1418   05/13/20 1215  cefTRIAXone (ROCEPHIN) 2 g in sodium chloride 0.9 % 100 mL IVPB        2 g 200 mL/hr over 30 Minutes Intravenous  Once 05/13/20 1213 05/13/20 1248   05/13/20 1145  cefTRIAXone (ROCEPHIN) 1 g in sodium chloride 0.9 % 100 mL IVPB  Status:   Discontinued        1 g 200 mL/hr over 30 Minutes Intravenous  Once 05/13/20 1142 05/13/20 1213        Objective: Vitals:   05/14/20 0408 05/14/20 0626 05/14/20 0751 05/14/20 1117  BP: (!) 118/56 (!) 130/52 122/61 129/60  Pulse: 83 86 92 99  Resp: 15 15  20   Temp: 98.8 F (37.1 C) 98.7 F (37.1 C)  100.2 F (37.9 C)  TempSrc: Oral Oral  Oral  SpO2: 100% 100% 100% 99%  Weight:  98.3 kg    Height:        Intake/Output Summary (Last 24 hours) at 05/14/2020 1242 Last data filed at 05/14/2020 1210 Gross per 24 hour  Intake 343.13 ml  Output 775 ml  Net -431.87 ml   Filed Weights   05/13/20 1112 05/14/20 0626  Weight: 95.3 kg 98.3 kg    Examination:  General exam: Appears calm and comfortable  Respiratory system: Clear to auscultation anteriorly. Respiratory effort normal. No respiratory distress. No conversational dyspnea. On [place] O2  Cardiovascular system: S1 & S2 heard, RRR.  Gastrointestinal system: Abdomen is nondistended, soft and nontender. Normal bowel sounds heard. Central nervous system: Alert and oriented. No focal neurological deficits. Speech clear.  Extremities: Symmetric in appearance  Skin: No rashes, lesions or ulcers on exposed skin  Psychiatry: Judgement and insight appear normal. Mood & affect appropriate.   Data Reviewed: I have personally reviewed following labs and imaging studies  CBC: Recent Labs  Lab 05/11/20 0114 05/13/20 1204 05/14/20 0505  WBC 15.0* 13.6* 12.2*  NEUTROABS 12.9* 11.3*  --   HGB 8.2* 7.7* 7.7*  HCT 25.0* 24.0* 24.0*  MCV 84.5 84.8 83.6  PLT 266 240 790   Basic Metabolic Panel: Recent Labs  Lab 05/11/20 0114 05/13/20 1204 05/14/20 0505  NA 137 133* 135  K 3.5 4.7 3.4*  CL 109 103 104  CO2 18* 16* 20*  GLUCOSE 100* 96 74  BUN 10 18 16   CREATININE 0.75 1.05 0.92  CALCIUM 8.3* 8.4* 7.7*  MG 1.6* 1.8  --    GFR: Estimated Creatinine Clearance: 81.1 mL/min (by C-G formula based on SCr of 0.92 mg/dL). Liver  Function Tests: Recent Labs  Lab 05/11/20 0114 05/13/20 1204  AST 34 45*  ALT 40 37  ALKPHOS 71 84  BILITOT 0.8 0.7  PROT 5.7* 5.7*  ALBUMIN 2.5* 2.3*   No results for input(s): LIPASE, AMYLASE in the last 168 hours. No results for input(s): AMMONIA in the last 168 hours. Coagulation Profile: Recent Labs  Lab 05/11/20 0114 05/13/20 1204 05/14/20 0505  INR 1.2 1.2 1.2   Cardiac Enzymes: No results for input(s): CKTOTAL, CKMB, CKMBINDEX, TROPONINI in the last 168 hours. BNP (last 3 results) No results for input(s): PROBNP in the last 8760 hours. HbA1C: No results for input(s): HGBA1C in the last 72 hours. CBG: Recent Labs  Lab 05/14/20 0152 05/14/20 0220 05/14/20 0738 05/14/20 0820 05/14/20 1105  GLUCAP 42* 120* 59* 93 83   Lipid Profile: No results for input(s): CHOL, HDL, LDLCALC, TRIG,  CHOLHDL, LDLDIRECT in the last 72 hours. Thyroid Function Tests: No results for input(s): TSH, T4TOTAL, FREET4, T3FREE, THYROIDAB in the last 72 hours. Anemia Panel: No results for input(s): VITAMINB12, FOLATE, FERRITIN, TIBC, IRON, RETICCTPCT in the last 72 hours. Sepsis Labs: Recent Labs  Lab 05/11/20 0200 05/13/20 1204 05/13/20 1510 05/14/20 0505  PROCALCITON  --   --   --  0.25  LATICACIDVEN 0.9 3.2* 2.1*  --     Recent Results (from the past 240 hour(s))  Urine culture     Status: Abnormal   Collection Time: 05/11/20  1:14 AM   Specimen: In/Out Cath Urine  Result Value Ref Range Status   Specimen Description   Final    IN/OUT CATH URINE Performed at [facility], [place] [address], [place], [place] [address]    Special Requests   Final    NONE Performed at [facility], [place] [address] [place], [place] [address]    Culture (A)  Final    80,000 COLONIES/mL KLEBSIELLA PNEUMONIAE 60,000 COLONIES/mL ESCHERICHIA COLI    Report Status 05/14/2020 FINAL  Final   Organism ID, Bacteria KLEBSIELLA PNEUMONIAE (A)  Final   Organism  ID, Bacteria ESCHERICHIA COLI (A)  Final      Susceptibility   Escherichia coli - MIC*    AMPICILLIN <=2 SENSITIVE Sensitive     CEFAZOLIN <=4 SENSITIVE Sensitive     CEFTRIAXONE <=0.25 SENSITIVE Sensitive     CIPROFLOXACIN <=0.25 SENSITIVE Sensitive     GENTAMICIN <=1 SENSITIVE Sensitive     IMIPENEM <=0.25 SENSITIVE Sensitive     NITROFURANTOIN <=16 SENSITIVE Sensitive     TRIMETH/SULFA <=20 SENSITIVE Sensitive     AMPICILLIN/SULBACTAM <=2 SENSITIVE Sensitive     PIP/TAZO <=4 SENSITIVE Sensitive     * 60,000 COLONIES/mL ESCHERICHIA COLI   Klebsiella pneumoniae - MIC*    AMPICILLIN >=32 RESISTANT Resistant     CEFAZOLIN <=4 SENSITIVE Sensitive     CEFTRIAXONE <=0.25 SENSITIVE Sensitive     CIPROFLOXACIN <=0.25 SENSITIVE Sensitive     GENTAMICIN <=1 SENSITIVE Sensitive     IMIPENEM <=0.25 SENSITIVE Sensitive     NITROFURANTOIN 64 INTERMEDIATE Intermediate     TRIMETH/SULFA <=20 SENSITIVE Sensitive     AMPICILLIN/SULBACTAM 8 SENSITIVE Sensitive     PIP/TAZO <=4 SENSITIVE Sensitive     * 80,000 COLONIES/mL KLEBSIELLA PNEUMONIAE  Blood Culture (routine x 2)     Status: Abnormal (Preliminary result)   Collection Time: 05/11/20  1:14 AM   Specimen: BLOOD  Result Value Ref Range Status   Specimen Description   Final    BLOOD PORTA CATH Performed at [facility], [place] [address], [place], [place] [address]    Special Requests   Final    BOTTLES DRAWN AEROBIC AND ANAEROBIC Blood Culture results may not be optimal due to an inadequate volume of blood received in culture bottles Performed at [facility] [address] [place] [address], [place], [place] [address]    Culture  Setup Time   Final    GRAM POSITIVE COCCI IN BOTH AEROBIC AND ANAEROBIC BOTTLES CRITICAL RESULT CALLED TO, READ BACK BY AND VERIFIED WITH: RN Reginia Forts [phone] [id number] FCP    Culture (A)  Final    STREPTOCOCCUS ANGINOSIS IDENTIFICATION AND SUSCEPTIBILITIES TO FOLLOW Performed at [facility], [place] [address] [place],  [address]    Report Status PENDING  Incomplete  Blood Culture ID Panel (Reflexed)     Status:  Abnormal   Collection Time: 05/11/20  1:14 AM  Result Value Ref Range Status   Enterococcus faecalis NOT DETECTED NOT DETECTED Final   Enterococcus Faecium NOT DETECTED NOT DETECTED Final   Listeria monocytogenes NOT DETECTED NOT DETECTED Final   Staphylococcus species NOT DETECTED NOT DETECTED Final   Staphylococcus aureus (BCID) NOT DETECTED NOT DETECTED Final   Staphylococcus epidermidis NOT DETECTED NOT DETECTED Final   Staphylococcus lugdunensis NOT DETECTED NOT DETECTED Final   Streptococcus species DETECTED (A) NOT DETECTED Final    Comment: Not Enterococcus species, Streptococcus agalactiae, Streptococcus pyogenes, or Streptococcus pneumoniae. CRITICAL RESULT CALLED TO, READ BACK BY AND VERIFIED WITH: RN JAMIE B. [phone] [id number] FCP    Streptococcus agalactiae NOT DETECTED NOT DETECTED Final   Streptococcus pneumoniae NOT DETECTED NOT DETECTED Final   Streptococcus pyogenes NOT DETECTED NOT DETECTED Final   A.calcoaceticus-baumannii NOT DETECTED NOT DETECTED Final   Bacteroides fragilis NOT DETECTED NOT DETECTED Final   Enterobacterales NOT DETECTED NOT DETECTED Final   Enterobacter cloacae complex NOT DETECTED NOT DETECTED Final   Escherichia coli NOT DETECTED NOT DETECTED Final   Klebsiella aerogenes NOT DETECTED NOT DETECTED Final   Klebsiella oxytoca NOT DETECTED NOT DETECTED Final   Klebsiella pneumoniae NOT DETECTED NOT DETECTED Final   Proteus species NOT DETECTED NOT DETECTED Final   Salmonella species NOT DETECTED NOT DETECTED Final   Serratia marcescens NOT DETECTED NOT DETECTED Final   Haemophilus influenzae NOT DETECTED NOT DETECTED Final   Neisseria meningitidis NOT DETECTED NOT DETECTED Final   Pseudomonas aeruginosa NOT DETECTED NOT DETECTED Final   Stenotrophomonas maltophilia NOT DETECTED NOT DETECTED Final   Candida albicans  NOT DETECTED NOT DETECTED Final   Candida auris NOT DETECTED NOT DETECTED Final   Candida glabrata NOT DETECTED NOT DETECTED Final   Candida krusei NOT DETECTED NOT DETECTED Final   Candida parapsilosis NOT DETECTED NOT DETECTED Final   Candida tropicalis NOT DETECTED NOT DETECTED Final   Cryptococcus neoformans/gattii NOT DETECTED NOT DETECTED Final    Comment: Performed at [facility], [address] [place], [place] [address]  SARS Coronavirus 2 by RT PCR (hospital order, performed in [facility] hospital lab) Nasopharyngeal Nasopharyngeal Swab     Status: None   Collection Time: 05/11/20  1:15 AM   Specimen: Nasopharyngeal Swab  Result Value Ref Range Status   SARS Coronavirus 2 NEGATIVE NEGATIVE Final    Comment: (NOTE) SARS-CoV-2 target nucleic acids are NOT DETECTED.  The SARS-CoV-2 RNA is generally detectable in upper and lower respiratory specimens during the acute phase of infection. The lowest concentration of SARS-CoV-2 viral copies this assay can detect is 250 copies / mL. A negative result does not preclude SARS-CoV-2 infection and should not be used as the sole basis for treatment or other patient management decisions.  A negative result may occur with improper specimen collection / handling, submission of specimen other than nasopharyngeal swab, presence of viral mutation(s) within the areas targeted by this assay, and inadequate number of viral copies (<250 copies / mL). A negative result must be combined with clinical observations, patient history, and epidemiological information.  Fact Sheet for Patients:   StrictlyIdeas.no  Fact Sheet for Healthcare Providers: BankingDealers.co.za  This test is not yet approved or  cleared by the [place] FDA and has been authorized for detection and/or diagnosis of SARS-CoV-2 by FDA under an Emergency Use Authorization (EUA).  This EUA will remain in effect (meaning  this test can be used) for  the duration of the COVID-19 declaration under Section 564(b)(1) of the Act, 21 U.S.C. section 360bbb-3(b)(1), unless the authorization is terminated or revoked sooner.  Performed at [facility], [place] [address], [place], [place] [address]   SARS Coronavirus 2 by RT PCR (hospital order, performed in [facility] hospital lab) Nasopharyngeal Nasopharyngeal Swab     Status: None   Collection Time: 05/13/20 12:20 PM   Specimen: Nasopharyngeal Swab  Result Value Ref Range Status   SARS Coronavirus 2 NEGATIVE NEGATIVE Final    Comment: (NOTE) SARS-CoV-2 target nucleic acids are NOT DETECTED.  The SARS-CoV-2 RNA is generally detectable in upper and lower respiratory specimens during the acute phase of infection. The lowest concentration of SARS-CoV-2 viral copies this assay can detect is 250 copies / mL. A negative result does not preclude SARS-CoV-2 infection and should not be used as the sole basis for treatment or other patient management decisions.  A negative result may occur with improper specimen collection / handling, submission of specimen other than nasopharyngeal swab, presence of viral mutation(s) within the areas targeted by this assay, and inadequate number of viral copies (<250 copies / mL). A negative result must be combined with clinical observations, patient history, and epidemiological information.  Fact Sheet for Patients:   StrictlyIdeas.no  Fact Sheet for Healthcare Providers: BankingDealers.co.za  This test is not yet approved or  cleared by the [place] FDA and has been authorized for detection and/or diagnosis of SARS-CoV-2 by FDA under an Emergency Use Authorization (EUA).  This EUA will remain in effect (meaning this test can be used) for the duration of the COVID-19 declaration under Section 564(b)(1) of the Act, 21 U.S.C. section 360bbb-3(b)(1), unless the  authorization is terminated or revoked sooner.  Performed at [facility], [place] [address] [place], [place] [address]       Radiology Studies: [facility]  Result Date: 05/13/2020 CLINICAL DATA:  Questionable sepsis. EXAM: PORTABLE CHEST 1 VIEW COMPARISON:  05/11/2020 FINDINGS: RIGHT-sided Port-A-Cath in stable position. Trachea midline.  Cardiomediastinal contours are stable. Mild central pulmonary vascular engorgement suggested with increasing opacities in the lung bases without signs of dense consolidative change. No definite effusion. On limited assessment skeletal structures without acute process. IMPRESSION: Mild central pulmonary vascular engorgement suggested with increasing opacities in the lung bases without signs of dense consolidative change. Findings could represent atelectasis or developing infection particularly at the RIGHT lung base. Electronically Signed   By: Zetta Bills M.D.   On: 05/13/2020 12:04   ECHOCARDIOGRAM COMPLETE  Result Date: 05/13/2020    ECHOCARDIOGRAM REPORT   Patient Name:   Duran Ohern. Date of Exam: 05/13/2020 Medical Rec #:  [id number]          Height:       67.0 in Accession #:    [id number]         Weight:       210.0 lb Date of Birth:  [birth year]         BSA:          2.065 m Patient Age:    44 years           BP:           103/44 mmHg Patient Gender: M                  HR:           76 bpm. Exam Location:  Inpatient  Procedure: 2D Echo, Cardiac Doppler and Color Doppler Indications:    Bacteremia  History:        Patient has prior history of Echocardiogram examinations, most                 recent 02/05/2020. CHF, Aortic Valve Disease,                 Signs/Symptoms:Bacteremia and Resp. distress; Risk                 Factors:Dyslipidemia. Anemia.  Sonographer:    Dustin Flock Referring Phys: [id number] [place]  1. Left ventricular ejection fraction, by estimation, is 65 to 70%. The left ventricle has  normal function. The left ventricle has no regional wall motion abnormalities. There is mild concentric left ventricular hypertrophy. Left ventricular diastolic parameters are consistent with Grade II diastolic dysfunction (pseudonormalization). Elevated left atrial pressure.  2. Right ventricular systolic function is normal. The right ventricular size is normal. There is mildly elevated pulmonary artery systolic pressure.  3. Left atrial size was mildly dilated.  4. The mitral valve is normal in structure. Mild mitral valve regurgitation.  5. There is suspicion for a vegetation on both the left and the right coronary cusps and an accompanying eccentric jet of aortic insufficiency from the base of the right-left commisure. Cannot exclude a flail segment of the right cusp. The aortic valve is tricuspid. There is moderate calcification of the aortic valve. There is moderate thickening of the aortic valve. Aortic valve regurgitation is severe. Mild to moderate aortic valve stenosis. Comparison(s): Changes from prior study are noted. The aortic valve gradients are lower and the aortic leaflets appear more mobile. Unfortunately, this may be due to right aortic cusp destruction with a flail component and severe aortic insufficiency. Recommend TEE. FINDINGS  Left Ventricle: Left ventricular ejection fraction, by estimation, is 65 to 70%. The left ventricle has normal function. The left ventricle has no regional wall motion abnormalities. The left ventricular internal cavity size was normal in size. There is  mild concentric left ventricular hypertrophy. Left ventricular diastolic parameters are consistent with Grade II diastolic dysfunction (pseudonormalization). Elevated left atrial pressure. Right Ventricle: The right ventricular size is normal. No increase in right ventricular wall thickness. Right ventricular systolic function is normal. There is mildly elevated pulmonary artery systolic pressure. The tricuspid  regurgitant velocity is 3.07  m/s, and with an assumed right atrial pressure of 3 mmHg, the estimated right ventricular systolic pressure is 66.0 mmHg. Left Atrium: Left atrial size was mildly dilated. Right Atrium: Right atrial size was normal in size. Pericardium: There is no evidence of pericardial effusion. Mitral Valve: The mitral valve is normal in structure. Mild mitral valve regurgitation, with centrally-directed jet. Tricuspid Valve: The tricuspid valve is normal in structure. Tricuspid valve regurgitation is mild. Aortic Valve: There is suspicion for a vegetation on both the left and the right coronary cusps and an accompanying eccentric jet of aortic insufficiency from the base of the right-left commisure. Cannot exclude a flail segment of the right cusp. The aortic valve is tricuspid. There is moderate calcification of the aortic valve. There is moderate thickening of the aortic valve. Aortic valve regurgitation is severe. Mild to moderate aortic stenosis is present. Aortic valve mean gradient measures 12.0 mmHg. Aortic valve peak gradient measures 22.1 mmHg. Aortic valve area, by VTI measures 1.90 cm. Pulmonic Valve: The pulmonic valve was normal in structure. Pulmonic valve regurgitation is not visualized. Aorta: The  aortic root is normal in size and structure. IAS/Shunts: No atrial level shunt detected by color flow Doppler.  LEFT VENTRICLE PLAX 2D LVIDd:         5.30 cm  Diastology LVIDs:         3.30 cm  LV e' medial:    6.20 cm/s LV PW:         1.60 cm  LV E/e' medial:  20.5 LV IVS:        1.50 cm  LV e' lateral:   8.70 cm/s LVOT diam:     2.20 cm  LV E/e' lateral: 14.6 LV SV:         93 LV SV Index:   45 LVOT Area:     3.80 cm  RIGHT VENTRICLE RV Basal diam:  3.30 cm RV S prime:     21.30 cm/s TAPSE (M-mode): 3.2 cm LEFT ATRIUM             Index       RIGHT ATRIUM           Index LA diam:        4.30 cm 2.08 cm/m  RA Area:     17.20 cm LA Vol (A2C):   62.3 ml 30.18 ml/m RA Volume:   47.00 ml   22.76 ml/m LA Vol (A4C):   52.0 ml 25.19 ml/m LA Biplane Vol: 57.8 ml 28.00 ml/m  AORTIC VALVE AV Area (Vmax):    1.76 cm AV Area (Vmean):   1.77 cm AV Area (VTI):     1.90 cm AV Vmax:           235.25 cm/s AV Vmean:          160.000 cm/s AV VTI:            0.487 m AV Peak Grad:      22.1 mmHg AV Mean Grad:      12.0 mmHg LVOT Vmax:         109.00 cm/s LVOT Vmean:        74.700 cm/s LVOT VTI:          0.244 m LVOT/AV VTI ratio: 0.50  AORTA Ao Root diam: 3.30 cm MITRAL VALVE                TRICUSPID VALVE MV Area (PHT): 7.66 cm     TR Peak grad:   37.7 mmHg MV Decel Time: 99 msec      TR Vmax:        307.00 cm/s MV E velocity: 127.00 cm/s MV A velocity: 82.00 cm/s   SHUNTS MV E/A ratio:  1.55         Systemic VTI:  0.24 m                             Systemic Diam: 2.20 cm [name] Croitoru MD Electronically signed by Sanda Klein MD Signature Date/Time: 05/13/2020/4:52:20 PM    Final       Scheduled Meds: . abiraterone acetate  1,000 mg Oral Daily  . aspirin EC  81 mg Oral Daily  . atorvastatin  40 mg Oral q1800  . doxazosin  8 mg Oral QHS  . furosemide  40 mg Intravenous Q12H  . influenza vaccine adjuvanted  0.5 mL Intramuscular Tomorrow-1000  . levothyroxine  150 mcg Oral Daily  . pantoprazole  40 mg Oral Daily  . pneumococcal 23 valent vaccine  0.5 mL  Intramuscular Tomorrow-1000  . predniSONE  5 mg Oral Q breakfast  . sodium chloride flush  3 mL Intravenous Q12H   Continuous Infusions: . sodium chloride Stopped (05/14/20 1210)  . cefTRIAXone (ROCEPHIN)  IV 2 g (05/14/20 1123)     LOS: 1 day      Time spent: 45 minutes   Dessa Phi, DO [facility] 05/14/2020, 12:42 PM   Available via Epic secure chat 7am-7pm After these hours, please refer to coverage provider listed on [email or web address]

## 2020-05-14 NOTE — Progress Notes (Signed)
Hypoglycemic Event  CBG: 59  Treatment: D50 25 mL (12.5 gm)  Symptoms: None  Follow-up CBG: Time:0820 CBG Result:93  Possible Reasons for Event: Inadequate meal intake  Comments/MD notified:Dr. Allayne Gitelman, [place] Found

## 2020-05-14 NOTE — Anesthesia Preprocedure Evaluation (Addendum)
Anesthesia Evaluation  Patient identified by MRN, date of birth, ID band  Reviewed: Allergy & Precautions, NPO status , Patient's Chart, lab work & pertinent test results  Airway Mallampati: II  TM Distance: >3 FB Neck ROM: Full    Dental  (+) Poor Dentition, Missing,    Pulmonary former smoker,    breath sounds clear to auscultation       Cardiovascular hypertension, + Valvular Problems/Murmurs (mild AS) AS  Rhythm:Regular Rate:Normal + Systolic murmurs    Neuro/Psych    GI/Hepatic GI bleed   Endo/Other  diabetes, Type 2, Oral Hypoglycemic AgentsHypothyroidism obesity  Renal/GU      Musculoskeletal   Abdominal (+) + obese,   Peds  Hematology  (+) anemia , Hgb 7.6   Anesthesia Other Findings   Reproductive/Obstetrics                             Anesthesia Physical  Anesthesia Plan  ASA: III  Anesthesia Plan: MAC   Post-op Pain Management:    Induction: Intravenous  PONV Risk Score and Plan: 1 and Propofol infusion, TIVA and Ondansetron  Airway Management Planned: Nasal Cannula and Simple Face Mask  Additional Equipment: None  Intra-op Plan:   Post-operative Plan:   Informed Consent: I have reviewed the patients History and Physical, chart, labs and discussed the procedure including the risks, benefits and alternatives for the proposed anesthesia with the patient or authorized representative who has indicated his/her understanding and acceptance.   Patient has DNR.  Discussed DNR with patient and Suspend DNR.   Dental advisory given  Plan Discussed with: Anesthesiologist and CRNA  Anesthesia Plan Comments: (Lab Results      Component                Value               Date                      WBC                      12.2 (H)            05/14/2020                HGB                      7.7 (L)             05/14/2020                HCT                      24.0 (L)             05/14/2020                MCV                      83.6                05/14/2020                PLT                      245                 05/14/2020  ECHO 05/13/20: 1. Left ventricular ejection fraction, by estimation, is 65 to 70%. The left ventricle has normal function. The left ventricle has no regional wall motion abnormalities. There is mild concentric left ventricular hypertrophy. Left ventricular diastolic parameters are consistent with Grade II diastolic dysfunction (pseudonormalization). Elevated left atrial pressure. 2. Right ventricular systolic function is normal. The right ventricular size is normal. There is mildly elevated pulmonary artery systolic pressure. 3. Left atrial size was mildly dilated. 4. The mitral valve is normal in structure. Mild mitral valve regurgitation. 5. There is suspicion for a vegetation on both the left and the right coronary cusps and an accompanying eccentric jet of aortic insufficiency from the base of the right-left commisure. Cannot exclude a flail segment of the right cusp. The aortic valve is tricuspid. There is moderate calcification of the aortic valve. There is moderate thickening of the aortic valve. Aortic valve regurgitation is severe. Mild to moderate aortic valve stenosis.)       Anesthesia Quick Evaluation

## 2020-05-15 ENCOUNTER — Inpatient Hospital Stay (HOSPITAL_COMMUNITY): Payer: Medicare Other

## 2020-05-15 ENCOUNTER — Inpatient Hospital Stay (HOSPITAL_COMMUNITY): Payer: Medicare Other | Admitting: Anesthesiology

## 2020-05-15 ENCOUNTER — Encounter (HOSPITAL_COMMUNITY)
Admission: EM | Disposition: A | Discharge status: Skilled Nursing Facility | Payer: Self-pay | Source: Home / Self Care | Attending: Thoracic Surgery (Cardiothoracic Vascular Surgery)

## 2020-05-15 ENCOUNTER — Encounter (HOSPITAL_COMMUNITY): Payer: Self-pay | Admitting: Cardiology

## 2020-05-15 DIAGNOSIS — Z954 Presence of other heart-valve replacement: Secondary | ICD-10-CM

## 2020-05-15 HISTORY — DX: Presence of other heart-valve replacement: Z95.4

## 2020-05-15 HISTORY — PX: AORTIC VALVE REPLACEMENT: SHX41

## 2020-05-15 HISTORY — PX: ASCENDING AORTIC ROOT REPLACEMENT: SHX5729

## 2020-05-15 HISTORY — PX: TEE WITHOUT CARDIOVERSION: SHX5443

## 2020-05-15 LAB — POCT I-STAT 7, (LYTES, BLD GAS, ICA,H+H)
Acid-Base Excess: 0 mmol/L (ref 0.0–2.0)
Acid-Base Excess: 0 mmol/L (ref 0.0–2.0)
Acid-Base Excess: 0 mmol/L (ref 0.0–2.0)
Acid-Base Excess: 0 mmol/L (ref 0.0–2.0)
Acid-Base Excess: 1 mmol/L (ref 0.0–2.0)
Acid-Base Excess: 1 mmol/L (ref 0.0–2.0)
Acid-Base Excess: 0 mmol/L (ref 0.0–2.0)
Acid-Base Excess: 0 mmol/L (ref 0.0–2.0)
Acid-Base Excess: 0 mmol/L (ref 0.0–2.0)
Acid-base deficit: 5 mmol/L — ABNORMAL HIGH (ref 0.0–2.0)
Acid-base deficit: 2 mmol/L (ref 0.0–2.0)
Acid-base deficit: 1 mmol/L (ref 0.0–2.0)
Bicarbonate: 20.1 mmol/L (ref 20.0–28.0)
Bicarbonate: 22.4 mmol/L (ref 20.0–28.0)
Bicarbonate: 24.3 mmol/L (ref 20.0–28.0)
Bicarbonate: 24.3 mmol/L (ref 20.0–28.0)
Bicarbonate: 24.4 mmol/L (ref 20.0–28.0)
Bicarbonate: 24.9 mmol/L (ref 20.0–28.0)
Bicarbonate: 24.7 mmol/L (ref 20.0–28.0)
Bicarbonate: 24.7 mmol/L (ref 20.0–28.0)
Bicarbonate: 24.3 mmol/L (ref 20.0–28.0)
Bicarbonate: 23.5 mmol/L (ref 20.0–28.0)
Bicarbonate: 24 mmol/L (ref 20.0–28.0)
Bicarbonate: 23.5 mmol/L (ref 20.0–28.0)
Calcium, Ion: 1.12 mmol/L — ABNORMAL LOW (ref 1.15–1.40)
Calcium, Ion: 0.9 mmol/L — ABNORMAL LOW (ref 1.15–1.40)
Calcium, Ion: 0.96 mmol/L — ABNORMAL LOW (ref 1.15–1.40)
Calcium, Ion: 0.91 mmol/L — ABNORMAL LOW (ref 1.15–1.40)
Calcium, Ion: 0.93 mmol/L — ABNORMAL LOW (ref 1.15–1.40)
Calcium, Ion: 0.91 mmol/L — ABNORMAL LOW (ref 1.15–1.40)
Calcium, Ion: 1.14 mmol/L — ABNORMAL LOW (ref 1.15–1.40)
Calcium, Ion: 1.03 mmol/L — ABNORMAL LOW (ref 1.15–1.40)
Calcium, Ion: 1.18 mmol/L (ref 1.15–1.40)
Calcium, Ion: 1.14 mmol/L — ABNORMAL LOW (ref 1.15–1.40)
Calcium, Ion: 1.11 mmol/L — ABNORMAL LOW (ref 1.15–1.40)
Calcium, Ion: 1.11 mmol/L — ABNORMAL LOW (ref 1.15–1.40)
HCT: 22 % — ABNORMAL LOW (ref 39.0–52.0)
HCT: 20 % — ABNORMAL LOW (ref 39.0–52.0)
HCT: 25 % — ABNORMAL LOW (ref 39.0–52.0)
HCT: 26 % — ABNORMAL LOW (ref 39.0–52.0)
HCT: 26 % — ABNORMAL LOW (ref 39.0–52.0)
HCT: 26 % — ABNORMAL LOW (ref 39.0–52.0)
HCT: 27 % — ABNORMAL LOW (ref 39.0–52.0)
HCT: 33 % — ABNORMAL LOW (ref 39.0–52.0)
HCT: 33 % — ABNORMAL LOW (ref 39.0–52.0)
HCT: 36 % — ABNORMAL LOW (ref 39.0–52.0)
HCT: 35 % — ABNORMAL LOW (ref 39.0–52.0)
HCT: 36 % — ABNORMAL LOW (ref 39.0–52.0)
Hemoglobin: 7.5 g/dL — ABNORMAL LOW (ref 13.0–17.0)
Hemoglobin: 6.8 g/dL — CL (ref 13.0–17.0)
Hemoglobin: 8.5 g/dL — ABNORMAL LOW (ref 13.0–17.0)
Hemoglobin: 8.8 g/dL — ABNORMAL LOW (ref 13.0–17.0)
Hemoglobin: 8.8 g/dL — ABNORMAL LOW (ref 13.0–17.0)
Hemoglobin: 8.8 g/dL — ABNORMAL LOW (ref 13.0–17.0)
Hemoglobin: 9.2 g/dL — ABNORMAL LOW (ref 13.0–17.0)
Hemoglobin: 11.2 g/dL — ABNORMAL LOW (ref 13.0–17.0)
Hemoglobin: 11.2 g/dL — ABNORMAL LOW (ref 13.0–17.0)
Hemoglobin: 12.2 g/dL — ABNORMAL LOW (ref 13.0–17.0)
Hemoglobin: 11.9 g/dL — ABNORMAL LOW (ref 13.0–17.0)
Hemoglobin: 12.2 g/dL — ABNORMAL LOW (ref 13.0–17.0)
O2 Saturation: 100 %
O2 Saturation: 100 %
O2 Saturation: 100 %
O2 Saturation: 100 %
O2 Saturation: 100 %
O2 Saturation: 100 %
O2 Saturation: 100 %
O2 Saturation: 96 %
O2 Saturation: 95 %
O2 Saturation: 91 %
O2 Saturation: 99 %
O2 Saturation: 94 %
Patient temperature: 34.7
Patient temperature: 35.6
Patient temperature: 36.6
Patient temperature: 37
Patient temperature: 37.2
Potassium: 2.9 mmol/L — ABNORMAL LOW (ref 3.5–5.1)
Potassium: 2.9 mmol/L — ABNORMAL LOW (ref 3.5–5.1)
Potassium: 3.1 mmol/L — ABNORMAL LOW (ref 3.5–5.1)
Potassium: 3.3 mmol/L — ABNORMAL LOW (ref 3.5–5.1)
Potassium: 3.1 mmol/L — ABNORMAL LOW (ref 3.5–5.1)
Potassium: 3.1 mmol/L — ABNORMAL LOW (ref 3.5–5.1)
Potassium: 2.6 mmol/L — CL (ref 3.5–5.1)
Potassium: 2.8 mmol/L — ABNORMAL LOW (ref 3.5–5.1)
Potassium: 3 mmol/L — ABNORMAL LOW (ref 3.5–5.1)
Potassium: 3 mmol/L — ABNORMAL LOW (ref 3.5–5.1)
Potassium: 2.7 mmol/L — CL (ref 3.5–5.1)
Potassium: 2.9 mmol/L — ABNORMAL LOW (ref 3.5–5.1)
Sodium: 142 mmol/L (ref 135–145)
Sodium: 144 mmol/L (ref 135–145)
Sodium: 145 mmol/L (ref 135–145)
Sodium: 144 mmol/L (ref 135–145)
Sodium: 144 mmol/L (ref 135–145)
Sodium: 146 mmol/L — ABNORMAL HIGH (ref 135–145)
Sodium: 147 mmol/L — ABNORMAL HIGH (ref 135–145)
Sodium: 148 mmol/L — ABNORMAL HIGH (ref 135–145)
Sodium: 149 mmol/L — ABNORMAL HIGH (ref 135–145)
Sodium: 148 mmol/L — ABNORMAL HIGH (ref 135–145)
Sodium: 148 mmol/L — ABNORMAL HIGH (ref 135–145)
Sodium: 148 mmol/L — ABNORMAL HIGH (ref 135–145)
TCO2: 21 mmol/L — ABNORMAL LOW (ref 22–32)
TCO2: 23 mmol/L (ref 22–32)
TCO2: 25 mmol/L (ref 22–32)
TCO2: 25 mmol/L (ref 22–32)
TCO2: 26 mmol/L (ref 22–32)
TCO2: 26 mmol/L (ref 22–32)
TCO2: 26 mmol/L (ref 22–32)
TCO2: 26 mmol/L (ref 22–32)
TCO2: 25 mmol/L (ref 22–32)
TCO2: 25 mmol/L (ref 22–32)
TCO2: 25 mmol/L (ref 22–32)
TCO2: 25 mmol/L (ref 22–32)
pCO2 arterial: 34.4 mmHg (ref 32.0–48.0)
pCO2 arterial: 34.5 mmHg (ref 32.0–48.0)
pCO2 arterial: 36.7 mmHg (ref 32.0–48.0)
pCO2 arterial: 39.5 mmHg (ref 32.0–48.0)
pCO2 arterial: 40 mmHg (ref 32.0–48.0)
pCO2 arterial: 39.6 mmHg (ref 32.0–48.0)
pCO2 arterial: 34.7 mmHg (ref 32.0–48.0)
pCO2 arterial: 32.9 mmHg (ref 32.0–48.0)
pCO2 arterial: 37.7 mmHg (ref 32.0–48.0)
pCO2 arterial: 34.7 mmHg (ref 32.0–48.0)
pCO2 arterial: 35 mmHg (ref 32.0–48.0)
pCO2 arterial: 32.7 mmHg (ref 32.0–48.0)
pH, Arterial: 7.373 (ref 7.350–7.450)
pH, Arterial: 7.422 (ref 7.350–7.450)
pH, Arterial: 7.429 (ref 7.350–7.450)
pH, Arterial: 7.397 (ref 7.350–7.450)
pH, Arterial: 7.394 (ref 7.350–7.450)
pH, Arterial: 7.406 (ref 7.350–7.450)
pH, Arterial: 7.46 — ABNORMAL HIGH (ref 7.350–7.450)
pH, Arterial: 7.474 — ABNORMAL HIGH (ref 7.350–7.450)
pH, Arterial: 7.411 (ref 7.350–7.450)
pH, Arterial: 7.437 (ref 7.350–7.450)
pH, Arterial: 7.443 (ref 7.350–7.450)
pH, Arterial: 7.465 — ABNORMAL HIGH (ref 7.350–7.450)
pO2, Arterial: 390 mmHg — ABNORMAL HIGH (ref 83.0–108.0)
pO2, Arterial: 331 mmHg — ABNORMAL HIGH (ref 83.0–108.0)
pO2, Arterial: 358 mmHg — ABNORMAL HIGH (ref 83.0–108.0)
pO2, Arterial: 356 mmHg — ABNORMAL HIGH (ref 83.0–108.0)
pO2, Arterial: 351 mmHg — ABNORMAL HIGH (ref 83.0–108.0)
pO2, Arterial: 330 mmHg — ABNORMAL HIGH (ref 83.0–108.0)
pO2, Arterial: 336 mmHg — ABNORMAL HIGH (ref 83.0–108.0)
pO2, Arterial: 65 mmHg — ABNORMAL LOW (ref 83.0–108.0)
pO2, Arterial: 68 mmHg — ABNORMAL LOW (ref 83.0–108.0)
pO2, Arterial: 57 mmHg — ABNORMAL LOW (ref 83.0–108.0)
pO2, Arterial: 110 mmHg — ABNORMAL HIGH (ref 83.0–108.0)
pO2, Arterial: 65 mmHg — ABNORMAL LOW (ref 83.0–108.0)

## 2020-05-15 LAB — POCT I-STAT, CHEM 8
BUN: 7 mg/dL — ABNORMAL LOW (ref 8–23)
BUN: 7 mg/dL — ABNORMAL LOW (ref 8–23)
BUN: 7 mg/dL — ABNORMAL LOW (ref 8–23)
BUN: 7 mg/dL — ABNORMAL LOW (ref 8–23)
BUN: 7 mg/dL — ABNORMAL LOW (ref 8–23)
BUN: 6 mg/dL — ABNORMAL LOW (ref 8–23)
BUN: 7 mg/dL — ABNORMAL LOW (ref 8–23)
BUN: 7 mg/dL — ABNORMAL LOW (ref 8–23)
Calcium, Ion: 1.14 mmol/L — ABNORMAL LOW (ref 1.15–1.40)
Calcium, Ion: 0.9 mmol/L — ABNORMAL LOW (ref 1.15–1.40)
Calcium, Ion: 1.14 mmol/L — ABNORMAL LOW (ref 1.15–1.40)
Calcium, Ion: 0.97 mmol/L — ABNORMAL LOW (ref 1.15–1.40)
Calcium, Ion: 0.93 mmol/L — ABNORMAL LOW (ref 1.15–1.40)
Calcium, Ion: 0.91 mmol/L — ABNORMAL LOW (ref 1.15–1.40)
Calcium, Ion: 0.94 mmol/L — ABNORMAL LOW (ref 1.15–1.40)
Calcium, Ion: 1.13 mmol/L — ABNORMAL LOW (ref 1.15–1.40)
Chloride: 106 mmol/L (ref 98–111)
Chloride: 106 mmol/L (ref 98–111)
Chloride: 107 mmol/L (ref 98–111)
Chloride: 103 mmol/L (ref 98–111)
Chloride: 107 mmol/L (ref 98–111)
Chloride: 106 mmol/L (ref 98–111)
Chloride: 107 mmol/L (ref 98–111)
Chloride: 109 mmol/L (ref 98–111)
Creatinine, Ser: 0.7 mg/dL (ref 0.61–1.24)
Creatinine, Ser: 0.6 mg/dL — ABNORMAL LOW (ref 0.61–1.24)
Creatinine, Ser: 0.7 mg/dL (ref 0.61–1.24)
Creatinine, Ser: 0.5 mg/dL — ABNORMAL LOW (ref 0.61–1.24)
Creatinine, Ser: 0.6 mg/dL — ABNORMAL LOW (ref 0.61–1.24)
Creatinine, Ser: 0.5 mg/dL — ABNORMAL LOW (ref 0.61–1.24)
Creatinine, Ser: 0.6 mg/dL — ABNORMAL LOW (ref 0.61–1.24)
Creatinine, Ser: 0.5 mg/dL — ABNORMAL LOW (ref 0.61–1.24)
Glucose, Bld: 116 mg/dL — ABNORMAL HIGH (ref 70–99)
Glucose, Bld: 122 mg/dL — ABNORMAL HIGH (ref 70–99)
Glucose, Bld: 60 mg/dL — ABNORMAL LOW (ref 70–99)
Glucose, Bld: 108 mg/dL — ABNORMAL HIGH (ref 70–99)
Glucose, Bld: 119 mg/dL — ABNORMAL HIGH (ref 70–99)
Glucose, Bld: 96 mg/dL (ref 70–99)
Glucose, Bld: 99 mg/dL (ref 70–99)
Glucose, Bld: 93 mg/dL (ref 70–99)
HCT: 23 % — ABNORMAL LOW (ref 39.0–52.0)
HCT: 19 % — ABNORMAL LOW (ref 39.0–52.0)
HCT: 21 % — ABNORMAL LOW (ref 39.0–52.0)
HCT: 24 % — ABNORMAL LOW (ref 39.0–52.0)
HCT: 26 % — ABNORMAL LOW (ref 39.0–52.0)
HCT: 24 % — ABNORMAL LOW (ref 39.0–52.0)
HCT: 26 % — ABNORMAL LOW (ref 39.0–52.0)
HCT: 28 % — ABNORMAL LOW (ref 39.0–52.0)
Hemoglobin: 7.8 g/dL — ABNORMAL LOW (ref 13.0–17.0)
Hemoglobin: 6.5 g/dL — CL (ref 13.0–17.0)
Hemoglobin: 7.1 g/dL — ABNORMAL LOW (ref 13.0–17.0)
Hemoglobin: 8.2 g/dL — ABNORMAL LOW (ref 13.0–17.0)
Hemoglobin: 8.8 g/dL — ABNORMAL LOW (ref 13.0–17.0)
Hemoglobin: 8.2 g/dL — ABNORMAL LOW (ref 13.0–17.0)
Hemoglobin: 8.8 g/dL — ABNORMAL LOW (ref 13.0–17.0)
Hemoglobin: 9.5 g/dL — ABNORMAL LOW (ref 13.0–17.0)
Potassium: 2.9 mmol/L — ABNORMAL LOW (ref 3.5–5.1)
Potassium: 2.6 mmol/L — CL (ref 3.5–5.1)
Potassium: 4.3 mmol/L (ref 3.5–5.1)
Potassium: 3.3 mmol/L — ABNORMAL LOW (ref 3.5–5.1)
Potassium: 3.3 mmol/L — ABNORMAL LOW (ref 3.5–5.1)
Potassium: 2.9 mmol/L — ABNORMAL LOW (ref 3.5–5.1)
Potassium: 4 mmol/L (ref 3.5–5.1)
Potassium: 2.6 mmol/L — CL (ref 3.5–5.1)
Sodium: 143 mmol/L (ref 135–145)
Sodium: 142 mmol/L (ref 135–145)
Sodium: 143 mmol/L (ref 135–145)
Sodium: 140 mmol/L (ref 135–145)
Sodium: 144 mmol/L (ref 135–145)
Sodium: 144 mmol/L (ref 135–145)
Sodium: 144 mmol/L (ref 135–145)
Sodium: 146 mmol/L — ABNORMAL HIGH (ref 135–145)
TCO2: 19 mmol/L — ABNORMAL LOW (ref 22–32)
TCO2: 19 mmol/L — ABNORMAL LOW (ref 22–32)
TCO2: 24 mmol/L (ref 22–32)
TCO2: 23 mmol/L (ref 22–32)
TCO2: 23 mmol/L (ref 22–32)
TCO2: 23 mmol/L (ref 22–32)
TCO2: 23 mmol/L (ref 22–32)
TCO2: 23 mmol/L (ref 22–32)

## 2020-05-15 LAB — CBC
HCT: 25.6 % — ABNORMAL LOW (ref 39.0–52.0)
HCT: 33.4 % — ABNORMAL LOW (ref 39.0–52.0)
HCT: 36.8 % — ABNORMAL LOW (ref 39.0–52.0)
Hemoglobin: 8.2 g/dL — ABNORMAL LOW (ref 13.0–17.0)
Hemoglobin: 11.2 g/dL — ABNORMAL LOW (ref 13.0–17.0)
Hemoglobin: 12.3 g/dL — ABNORMAL LOW (ref 13.0–17.0)
MCH: 26.4 pg (ref 26.0–34.0)
MCH: 28.4 pg (ref 26.0–34.0)
MCH: 28 pg (ref 26.0–34.0)
MCHC: 32 g/dL (ref 30.0–36.0)
MCHC: 33.5 g/dL (ref 30.0–36.0)
MCHC: 33.4 g/dL (ref 30.0–36.0)
MCV: 82.3 fL (ref 80.0–100.0)
MCV: 84.8 fL (ref 80.0–100.0)
MCV: 83.6 fL (ref 80.0–100.0)
Platelets: 283 10*3/uL (ref 150–400)
Platelets: 125 10*3/uL — ABNORMAL LOW (ref 150–400)
Platelets: 183 10*3/uL (ref 150–400)
RBC: 3.11 MIL/uL — ABNORMAL LOW (ref 4.22–5.81)
RBC: 3.94 MIL/uL — ABNORMAL LOW (ref 4.22–5.81)
RBC: 4.4 MIL/uL (ref 4.22–5.81)
RDW: 16.2 % — ABNORMAL HIGH (ref 11.5–15.5)
RDW: 15.4 % (ref 11.5–15.5)
RDW: 15.5 % (ref 11.5–15.5)
WBC: 12.4 10*3/uL — ABNORMAL HIGH (ref 4.0–10.5)
WBC: 12.6 10*3/uL — ABNORMAL HIGH (ref 4.0–10.5)
WBC: 12.3 10*3/uL — ABNORMAL HIGH (ref 4.0–10.5)
nRBC: 0 % (ref 0.0–0.2)
nRBC: 0 % (ref 0.0–0.2)
nRBC: 0 % (ref 0.0–0.2)

## 2020-05-15 LAB — ECHO INTRAOPERATIVE TEE
AV Mean grad: 5.5 mmHg
AV Peak grad: 11.8 mmHg
Ao pk vel: 1.72 m/s
Height: 67 in
Weight: 3298.08 oz

## 2020-05-15 LAB — BASIC METABOLIC PANEL
Anion gap: 12 (ref 5–15)
Anion gap: 10 (ref 5–15)
BUN: 8 mg/dL (ref 8–23)
BUN: 7 mg/dL — ABNORMAL LOW (ref 8–23)
CO2: 21 mmol/L — ABNORMAL LOW (ref 22–32)
CO2: 23 mmol/L (ref 22–32)
Calcium: 8 mg/dL — ABNORMAL LOW (ref 8.9–10.3)
Calcium: 7.7 mg/dL — ABNORMAL LOW (ref 8.9–10.3)
Chloride: 108 mmol/L (ref 98–111)
Chloride: 113 mmol/L — ABNORMAL HIGH (ref 98–111)
Creatinine, Ser: 0.93 mg/dL (ref 0.61–1.24)
Creatinine, Ser: 0.64 mg/dL (ref 0.61–1.24)
GFR calc Af Amer: >60 mL/min (ref >60)
GFR calc Af Amer: >60 mL/min (ref >60)
GFR calc non Af Amer: >60 mL/min (ref >60)
GFR calc non Af Amer: >60 mL/min (ref >60)
Glucose, Bld: 101 mg/dL — ABNORMAL HIGH (ref 70–99)
Glucose, Bld: 104 mg/dL — ABNORMAL HIGH (ref 70–99)
Potassium: 2.8 mmol/L — ABNORMAL LOW (ref 3.5–5.1)
Potassium: 2.9 mmol/L — ABNORMAL LOW (ref 3.5–5.1)
Sodium: 141 mmol/L (ref 135–145)
Sodium: 146 mmol/L — ABNORMAL HIGH (ref 135–145)

## 2020-05-15 LAB — GLUCOSE, CAPILLARY
Glucose-Capillary: 100 mg/dL — ABNORMAL HIGH (ref 70–99)
Glucose-Capillary: 100 mg/dL — ABNORMAL HIGH (ref 70–99)
Glucose-Capillary: 101 mg/dL — ABNORMAL HIGH (ref 70–99)
Glucose-Capillary: 102 mg/dL — ABNORMAL HIGH (ref 70–99)
Glucose-Capillary: 109 mg/dL — ABNORMAL HIGH (ref 70–99)
Glucose-Capillary: 111 mg/dL — ABNORMAL HIGH (ref 70–99)
Glucose-Capillary: 122 mg/dL — ABNORMAL HIGH (ref 70–99)
Glucose-Capillary: 92 mg/dL (ref 70–99)
Glucose-Capillary: 95 mg/dL (ref 70–99)

## 2020-05-15 LAB — SURGICAL PCR SCREEN
MRSA, PCR: NEGATIVE
Staphylococcus aureus: NEGATIVE

## 2020-05-15 LAB — FIBRINOGEN: Fibrinogen: 287 mg/dL (ref 210–475)

## 2020-05-15 LAB — PLATELET COUNT: Platelets: 68 10*3/uL — ABNORMAL LOW (ref 150–400)

## 2020-05-15 LAB — APTT: aPTT: 35 seconds (ref 24–36)

## 2020-05-15 LAB — CULTURE, BLOOD (ROUTINE X 2)

## 2020-05-15 LAB — HEMOGLOBIN AND HEMATOCRIT, BLOOD
HCT: 26.9 % — ABNORMAL LOW (ref 39.0–52.0)
Hemoglobin: 8.9 g/dL — ABNORMAL LOW (ref 13.0–17.0)

## 2020-05-15 LAB — PREPARE RBC (CROSSMATCH)

## 2020-05-15 LAB — PROTIME-INR
INR: 1.6 — ABNORMAL HIGH (ref 0.8–1.2)
Prothrombin Time: 18.8 seconds — ABNORMAL HIGH (ref 11.4–15.2)

## 2020-05-15 LAB — MAGNESIUM: Magnesium: 2.5 mg/dL — ABNORMAL HIGH (ref 1.7–2.4)

## 2020-05-15 SURGERY — REPLACEMENT, AORTIC VALVE, OPEN
Anesthesia: General | Site: Chest

## 2020-05-15 MED ORDER — ROCURONIUM BROMIDE 10 MG/ML (PF) SYRINGE
PREFILLED_SYRINGE | INTRAVENOUS | Status: AC
  Filled 2020-05-15: qty 10

## 2020-05-15 MED ORDER — ACETAMINOPHEN 160 MG/5ML PO SOLN: 1000.0000 mg | Freq: Four times a day (QID) | ORAL | Status: DC

## 2020-05-15 MED ORDER — PANTOPRAZOLE SODIUM 40 MG PO TBEC
40.0000 mg | DELAYED_RELEASE_TABLET | Freq: Every day | ORAL | Status: DC
  Administered 2020-05-17 – 2020-05-23 (×7): 40 mg via ORAL
  Filled 2020-05-15 (×7): qty 1

## 2020-05-15 MED ORDER — ORAL CARE MOUTH RINSE
15.0000 mL | OROMUCOSAL | Status: DC
  Administered 2020-05-15 (×2): 15 mL via OROMUCOSAL

## 2020-05-15 MED ORDER — CHLORHEXIDINE GLUCONATE 0.12 % MT SOLN
15.0000 mL | OROMUCOSAL | Status: AC
  Administered 2020-05-15: 15 mL via OROMUCOSAL

## 2020-05-15 MED ORDER — SODIUM CHLORIDE 0.9 % IV SOLN
1.5000 g | Freq: Two times a day (BID) | INTRAVENOUS | Status: DC
  Administered 2020-05-15 – 2020-05-16 (×2): 1.5 g via INTRAVENOUS
  Filled 2020-05-15 (×2): qty 1.5

## 2020-05-15 MED ORDER — SODIUM CHLORIDE 0.9 % IV SOLN
250.0000 mL | INTRAVENOUS | Status: DC
  Administered 2020-05-16: 250 mL via INTRAVENOUS

## 2020-05-15 MED ORDER — ONDANSETRON HCL 4 MG/2ML IJ SOLN
INTRAMUSCULAR | Status: DC | PRN
  Administered 2020-05-15: 4 mg via INTRAVENOUS

## 2020-05-15 MED ORDER — ARTIFICIAL TEARS OPHTHALMIC OINT
TOPICAL_OINTMENT | OPHTHALMIC | Status: AC
  Filled 2020-05-15: qty 3.5

## 2020-05-15 MED ORDER — DOCUSATE SODIUM 100 MG PO CAPS
200.0000 mg | ORAL_CAPSULE | Freq: Every day | ORAL | Status: DC
  Administered 2020-05-16 – 2020-05-22 (×6): 200 mg via ORAL
  Filled 2020-05-15 (×8): qty 2

## 2020-05-15 MED ORDER — POTASSIUM CHLORIDE 10 MEQ/100ML IV SOLN
10.0000 meq | INTRAVENOUS | Status: DC
  Administered 2020-05-15 (×3): 10 meq via INTRAVENOUS
  Filled 2020-05-15 (×2): qty 100

## 2020-05-15 MED ORDER — ASPIRIN 81 MG PO CHEW: 324.0000 mg | CHEWABLE_TABLET | Freq: Every day | ORAL | Status: DC

## 2020-05-15 MED ORDER — LACTATED RINGERS IV SOLN: INTRAVENOUS | Status: DC

## 2020-05-15 MED ORDER — TRANEXAMIC ACID 1000 MG/10ML IV SOLN
1.5000 mg/kg/h | INTRAVENOUS | Status: DC
  Filled 2020-05-15: qty 25

## 2020-05-15 MED ORDER — SODIUM CHLORIDE 0.9 % IV SOLN: INTRAVENOUS | Status: DC

## 2020-05-15 MED ORDER — ACETAMINOPHEN 160 MG/5ML PO SOLN: 650.0000 mg | Freq: Once | ORAL | Status: AC

## 2020-05-15 MED ORDER — ACETAMINOPHEN 325 MG PO TABS
650.0000 mg | ORAL_TABLET | Freq: Four times a day (QID) | ORAL | Status: DC | PRN
  Administered 2020-05-15: 650 mg via ORAL
  Filled 2020-05-15: qty 2

## 2020-05-15 MED ORDER — POTASSIUM CHLORIDE 10 MEQ/50ML IV SOLN
10.0000 meq | INTRAVENOUS | Status: AC
  Administered 2020-05-15 – 2020-05-16 (×3): 10 meq via INTRAVENOUS
  Filled 2020-05-15 (×3): qty 50

## 2020-05-15 MED ORDER — INSULIN REGULAR(HUMAN) IN NACL 100-0.9 UT/100ML-% IV SOLN: INTRAVENOUS | Status: DC

## 2020-05-15 MED ORDER — FENTANYL CITRATE (PF) 250 MCG/5ML IJ SOLN
INTRAMUSCULAR | Status: AC
Start: 2020-05-15 — End: ?
  Filled 2020-05-15: qty 30

## 2020-05-15 MED ORDER — HEPARIN SODIUM (PORCINE) 1000 UNIT/ML IJ SOLN
INTRAMUSCULAR | Status: DC | PRN
  Administered 2020-05-15: 30000 [IU] via INTRAVENOUS

## 2020-05-15 MED ORDER — SODIUM CHLORIDE 0.9% FLUSH
3.0000 mL | INTRAVENOUS | Status: DC | PRN
  Administered 2020-05-20: 3 mL via INTRAVENOUS

## 2020-05-15 MED ORDER — ACETAMINOPHEN 650 MG RE SUPP
650.0000 mg | Freq: Once | RECTAL | Status: AC
  Administered 2020-05-15: 650 mg via RECTAL

## 2020-05-15 MED ORDER — BISACODYL 5 MG PO TBEC
10.0000 mg | DELAYED_RELEASE_TABLET | Freq: Every day | ORAL | Status: DC
  Administered 2020-05-16 – 2020-05-18 (×3): 10 mg via ORAL
  Filled 2020-05-15 (×8): qty 2

## 2020-05-15 MED ORDER — VANCOMYCIN HCL 1000 MG IV SOLR
INTRAVENOUS | Status: DC | PRN
  Administered 2020-05-15: 1000 mL

## 2020-05-15 MED ORDER — SODIUM CHLORIDE 0.9 % IV SOLN: INTRAVENOUS | Status: DC | PRN

## 2020-05-15 MED ORDER — DEXMEDETOMIDINE HCL IN NACL 400 MCG/100ML IV SOLN
0.0000 ug/kg/h | INTRAVENOUS | Status: DC
  Administered 2020-05-15: 0.3 ug/kg/h via INTRAVENOUS
  Filled 2020-05-15: qty 100

## 2020-05-15 MED ORDER — VANCOMYCIN HCL IN DEXTROSE 1-5 GM/200ML-% IV SOLN
1000.0000 mg | Freq: Once | INTRAVENOUS | Status: AC
  Administered 2020-05-15: 1000 mg via INTRAVENOUS

## 2020-05-15 MED ORDER — MIDAZOLAM HCL (PF) 10 MG/2ML IJ SOLN
INTRAMUSCULAR | Status: AC
  Filled 2020-05-15: qty 2

## 2020-05-15 MED ORDER — MIDAZOLAM HCL 5 MG/5ML IJ SOLN
INTRAMUSCULAR | Status: DC | PRN
  Administered 2020-05-15: 1 mg via INTRAVENOUS
  Administered 2020-05-15: 5 mg via INTRAVENOUS
  Administered 2020-05-15: 4 mg via INTRAVENOUS

## 2020-05-15 MED ORDER — CHLORHEXIDINE GLUCONATE CLOTH 2 % EX PADS
6.0000 | MEDICATED_PAD | Freq: Every day | CUTANEOUS | Status: DC
  Administered 2020-05-15 – 2020-05-22 (×6): 6 via TOPICAL

## 2020-05-15 MED ORDER — NITROGLYCERIN IN D5W 200-5 MCG/ML-% IV SOLN: 0.0000 ug/min | INTRAVENOUS | Status: DC

## 2020-05-15 MED ORDER — METOPROLOL TARTRATE 5 MG/5ML IV SOLN: 2.5000 mg | INTRAVENOUS | Status: DC | PRN

## 2020-05-15 MED ORDER — ACETAMINOPHEN 500 MG PO TABS
1000.0000 mg | ORAL_TABLET | Freq: Four times a day (QID) | ORAL | Status: AC
  Administered 2020-05-16 – 2020-05-20 (×16): 1000 mg via ORAL
  Filled 2020-05-15 (×17): qty 2

## 2020-05-15 MED ORDER — EPHEDRINE 5 MG/ML INJ
INTRAVENOUS | Status: AC
  Filled 2020-05-15: qty 10

## 2020-05-15 MED ORDER — POTASSIUM CHLORIDE 10 MEQ/100ML IV SOLN
INTRAVENOUS | Status: DC | PRN
  Administered 2020-05-15: 10 meq via INTRAVENOUS

## 2020-05-15 MED ORDER — ALBUMIN HUMAN 5 % IV SOLN
250.0000 mL | INTRAVENOUS | Status: AC | PRN
  Filled 2020-05-15: qty 250

## 2020-05-15 MED ORDER — PROPOFOL 10 MG/ML IV BOLUS
INTRAVENOUS | Status: AC
  Filled 2020-05-15: qty 20

## 2020-05-15 MED ORDER — TRAMADOL HCL 50 MG PO TABS
50.0000 mg | ORAL_TABLET | ORAL | Status: DC | PRN
  Administered 2020-05-17 – 2020-05-21 (×3): 100 mg via ORAL
  Filled 2020-05-15 (×3): qty 2

## 2020-05-15 MED ORDER — DEXTROSE 50 % IV SOLN
INTRAVENOUS | Status: DC | PRN
  Administered 2020-05-15: 35 mL via INTRAVENOUS

## 2020-05-15 MED ORDER — POTASSIUM CHLORIDE 10 MEQ/50ML IV SOLN
10.0000 meq | INTRAVENOUS | Status: AC
  Administered 2020-05-15 (×3): 10 meq via INTRAVENOUS

## 2020-05-15 MED ORDER — PROPOFOL 10 MG/ML IV BOLUS
INTRAVENOUS | Status: DC | PRN
  Administered 2020-05-15 (×3): 50 mg via INTRAVENOUS

## 2020-05-15 MED ORDER — PHENYLEPHRINE 40 MCG/ML (10ML) SYRINGE FOR IV PUSH (FOR BLOOD PRESSURE SUPPORT)
PREFILLED_SYRINGE | INTRAVENOUS | Status: AC
  Filled 2020-05-15: qty 10

## 2020-05-15 MED ORDER — CLEVIDIPINE BUTYRATE 0.5 MG/ML IV EMUL
INTRAVENOUS | Status: DC | PRN
  Administered 2020-05-15: 2 mg/h via INTRAVENOUS

## 2020-05-15 MED ORDER — SODIUM CHLORIDE 0.9% FLUSH: 10.0000 mL | INTRAVENOUS | Status: DC | PRN

## 2020-05-15 MED ORDER — DEXTROSE 50 % IV SOLN: 0.0000 mL | INTRAVENOUS | Status: DC | PRN

## 2020-05-15 MED ORDER — SODIUM CHLORIDE 0.45 % IV SOLN: INTRAVENOUS | Status: DC | PRN

## 2020-05-15 MED ORDER — MIDAZOLAM HCL 2 MG/2ML IJ SOLN
2.0000 mg | INTRAMUSCULAR | Status: DC | PRN
  Filled 2020-05-15: qty 2

## 2020-05-15 MED ORDER — SODIUM CHLORIDE (PF) 0.9 % IJ SOLN
OROMUCOSAL | Status: DC | PRN
  Administered 2020-05-15 (×3): 4 mL via TOPICAL

## 2020-05-15 MED ORDER — LACTATED RINGERS IV SOLN
500.0000 mL | Freq: Once | INTRAVENOUS | Status: AC | PRN
  Administered 2020-05-15: 500 mL via INTRAVENOUS

## 2020-05-15 MED ORDER — OXYCODONE HCL 5 MG PO TABS
5.0000 mg | ORAL_TABLET | ORAL | Status: DC | PRN
  Administered 2020-05-16 (×2): 5 mg via ORAL
  Filled 2020-05-15 (×2): qty 1

## 2020-05-15 MED ORDER — MAGNESIUM SULFATE 4 GM/100ML IV SOLN
4.0000 g | Freq: Once | INTRAVENOUS | Status: AC
  Administered 2020-05-15: 4 g via INTRAVENOUS
  Filled 2020-05-15: qty 100

## 2020-05-15 MED ORDER — PROTAMINE SULFATE 10 MG/ML IV SOLN
INTRAVENOUS | Status: DC | PRN
  Administered 2020-05-15: 300 mg via INTRAVENOUS

## 2020-05-15 MED ORDER — FENTANYL CITRATE (PF) 250 MCG/5ML IJ SOLN
INTRAMUSCULAR | Status: DC | PRN
Start: 2020-05-15 — End: 2020-05-15
  Administered 2020-05-15 (×5): 50 ug via INTRAVENOUS
  Administered 2020-05-15: 200 ug via INTRAVENOUS
  Administered 2020-05-15: 100 ug via INTRAVENOUS
  Administered 2020-05-15 (×4): 50 ug via INTRAVENOUS
  Administered 2020-05-15: 250 ug via INTRAVENOUS
  Administered 2020-05-15: 50 ug via INTRAVENOUS
  Administered 2020-05-15: 250 ug via INTRAVENOUS
  Administered 2020-05-15: 150 ug via INTRAVENOUS
  Administered 2020-05-15: 50 ug via INTRAVENOUS

## 2020-05-15 MED ORDER — MORPHINE SULFATE (PF) 2 MG/ML IV SOLN
1.0000 mg | INTRAVENOUS | Status: DC | PRN
  Administered 2020-05-15: 1 mg via INTRAVENOUS
  Administered 2020-05-16: 2 mg via INTRAVENOUS
  Filled 2020-05-15 (×2): qty 1

## 2020-05-15 MED ORDER — ASPIRIN EC 325 MG PO TBEC
325.0000 mg | DELAYED_RELEASE_TABLET | Freq: Every day | ORAL | Status: DC
  Administered 2020-05-16 – 2020-05-23 (×8): 325 mg via ORAL
  Filled 2020-05-15 (×8): qty 1

## 2020-05-15 MED ORDER — CHLORHEXIDINE GLUCONATE 0.12% ORAL RINSE (MEDLINE KIT)
15.0000 mL | Freq: Two times a day (BID) | OROMUCOSAL | Status: DC
  Administered 2020-05-15: 15 mL via OROMUCOSAL

## 2020-05-15 MED ORDER — FAMOTIDINE IN NACL 20-0.9 MG/50ML-% IV SOLN
20.0000 mg | Freq: Two times a day (BID) | INTRAVENOUS | Status: AC
  Administered 2020-05-15 (×2): 20 mg via INTRAVENOUS
  Filled 2020-05-15: qty 50

## 2020-05-15 MED ORDER — PHENYLEPHRINE HCL-NACL 20-0.9 MG/250ML-% IV SOLN: 0.0000 ug/min | INTRAVENOUS | Status: DC

## 2020-05-15 MED ORDER — SODIUM CHLORIDE 0.9% FLUSH
3.0000 mL | Freq: Two times a day (BID) | INTRAVENOUS | Status: DC
  Administered 2020-05-16 – 2020-05-21 (×10): 3 mL via INTRAVENOUS

## 2020-05-15 MED ORDER — ONDANSETRON HCL 4 MG/2ML IJ SOLN
4.0000 mg | Freq: Four times a day (QID) | INTRAMUSCULAR | Status: DC | PRN
  Administered 2020-05-16: 4 mg via INTRAVENOUS
  Filled 2020-05-15: qty 2

## 2020-05-15 MED ORDER — POTASSIUM CHLORIDE CRYS ER 20 MEQ PO TBCR
40.0000 meq | EXTENDED_RELEASE_TABLET | Freq: Once | ORAL | Status: AC
  Administered 2020-05-15: 40 meq via ORAL
  Filled 2020-05-15: qty 2

## 2020-05-15 MED ORDER — ROCURONIUM BROMIDE 10 MG/ML (PF) SYRINGE
PREFILLED_SYRINGE | INTRAVENOUS | Status: DC | PRN
  Administered 2020-05-15 (×2): 100 mg via INTRAVENOUS

## 2020-05-15 MED ORDER — ARTIFICIAL TEARS OPHTHALMIC OINT
TOPICAL_OINTMENT | OPHTHALMIC | Status: DC | PRN
  Administered 2020-05-15: 1 via OPHTHALMIC

## 2020-05-15 MED ORDER — PLASMA-LYTE 148 IV SOLN
INTRAVENOUS | Status: DC | PRN
  Administered 2020-05-15: 500 mL via INTRAVASCULAR

## 2020-05-15 MED ORDER — LACTATED RINGERS IV SOLN: INTRAVENOUS | Status: DC | PRN

## 2020-05-15 MED ORDER — SODIUM CHLORIDE 0.9% FLUSH
10.0000 mL | Freq: Two times a day (BID) | INTRAVENOUS | Status: DC
  Administered 2020-05-15: 20 mL
  Administered 2020-05-16 – 2020-05-22 (×9): 10 mL

## 2020-05-15 MED ORDER — CLEVIDIPINE BUTYRATE 0.5 MG/ML IV EMUL
0.0000 mg/h | INTRAVENOUS | Status: DC
  Administered 2020-05-15: 8 mg/h via INTRAVENOUS
  Administered 2020-05-16: 2 mg/h via INTRAVENOUS
  Administered 2020-05-16: 8 mg/h via INTRAVENOUS
  Filled 2020-05-15 (×5): qty 50

## 2020-05-15 MED ORDER — BISACODYL 10 MG RE SUPP
10.0000 mg | Freq: Every day | RECTAL | Status: DC
  Filled 2020-05-15 (×2): qty 1

## 2020-05-15 MED ORDER — SODIUM CHLORIDE 0.9 % IR SOLN
Status: DC | PRN
  Administered 2020-05-15: 5000 mL

## 2020-05-15 SURGICAL SUPPLY — 124 items
ADAPTER CARDIO PERF ANTE/RETRO (ADAPTER) ×3 IMPLANT
ADH SKN CLS APL DERMABOND .7 (GAUZE/BANDAGES/DRESSINGS) ×2
ADH SRG 12 PREFL SYR 3 SPRDR (MISCELLANEOUS)
ADPR PRFSN 84XANTGRD RTRGD (ADAPTER) ×2
APL SWBSTK 6 STRL LF DISP (MISCELLANEOUS) ×2
APPLICATOR COTTON TIP 6 STRL (MISCELLANEOUS) IMPLANT
APPLICATOR COTTON TIP 6IN STRL (MISCELLANEOUS) ×3
APPLICATOR TIP STD SYR BGAT-SY (MISCELLANEOUS) IMPLANT
ATTRACTOMAT 16X20 MAGNETIC DRP (DRAPES) ×2 IMPLANT
BAG DECANTER FOR FLEXI CONT (MISCELLANEOUS) ×3 IMPLANT
BLADE CLIPPER SURG (BLADE) ×2 IMPLANT
BLADE STERNUM SYSTEM 6 (BLADE) ×3 IMPLANT
BLADE SURG 11 STRL SS (BLADE) ×3 IMPLANT
CANISTER SUCT 3000ML PPV (MISCELLANEOUS) ×3 IMPLANT
CANNULA AORTIC ROOT 9FR (CANNULA) ×1 IMPLANT
CANNULA EZ GLIDE AORTIC 21FR (CANNULA) ×4 IMPLANT
CANNULA FEM BIOMEDICUS 25FR (CANNULA) ×1 IMPLANT
CANNULA GUNDRY RCSP 15FR (MISCELLANEOUS) ×3 IMPLANT
CANNULA OPTISITE PERFUSION 20F (CANNULA) ×1 IMPLANT
CANNULA VENNOUS METAL TIP 20FR (CANNULA) ×1 IMPLANT
CATH CPB KIT OWEN (MISCELLANEOUS) ×3 IMPLANT
CATH HEART VENT LEFT (CATHETERS) ×2 IMPLANT
CATH THORACIC 36FR (CATHETERS) ×1 IMPLANT
CATH THORACIC 36FR RT ANG (CATHETERS) ×2 IMPLANT
CNTNR URN SCR LID CUP LEK RST (MISCELLANEOUS) IMPLANT
CONN 3/8X1/2 ST GISH (MISCELLANEOUS) ×1 IMPLANT
CONNECTOR 1/2X3/8X1/2 3 WAY (MISCELLANEOUS) ×3
CONNECTOR 1/2X3/8X1/2 3WAY (MISCELLANEOUS) IMPLANT
CONT SPEC 4OZ STRL OR WHT (MISCELLANEOUS) ×6
COVER MAYO STAND STRL (DRAPES) ×1 IMPLANT
COVER PROBE W GEL 5X96 (DRAPES) ×1 IMPLANT
COVER SURGICAL LIGHT HANDLE (MISCELLANEOUS) ×6 IMPLANT
DERMABOND ADVANCED (GAUZE/BANDAGES/DRESSINGS) ×1
DERMABOND ADVANCED .7 DNX12 (GAUZE/BANDAGES/DRESSINGS) IMPLANT
DEVICE CLOSURE PERCLS PRGLD 6F (VASCULAR PRODUCTS) IMPLANT
DRAIN CHANNEL 32F RND 10.7 FF (WOUND CARE) ×5 IMPLANT
DRAPE CARDIOVASCULAR INCISE (DRAPES)
DRAPE CV SPLIT W-CLR ANES SCRN (DRAPES) ×1 IMPLANT
DRAPE INCISE IOBAN 66X45 STRL (DRAPES) ×8 IMPLANT
DRAPE PERI GROIN 82X75IN TIB (DRAPES) ×1 IMPLANT
DRAPE SLUSH/WARMER DISC (DRAPES) ×3 IMPLANT
DRAPE SRG 135X102X78XABS (DRAPES) IMPLANT
DRSG AQUACEL AG ADV 3.5X14 (GAUZE/BANDAGES/DRESSINGS) ×3 IMPLANT
ELECT BLADE 4.0 EZ CLEAN MEGAD (MISCELLANEOUS) ×3
ELECT REM PT RETURN 9FT ADLT (ELECTROSURGICAL) ×6
ELECTRODE BLDE 4.0 EZ CLN MEGD (MISCELLANEOUS) IMPLANT
ELECTRODE REM PT RTRN 9FT ADLT (ELECTROSURGICAL) ×4 IMPLANT
FELT TEFLON 1X6 (MISCELLANEOUS) ×6 IMPLANT
FIBERTAPE STERNAL CLSR 2 36IN (SUTURE) ×4 IMPLANT
FIBERTAPE STERNAL CLSR 2X36 (SUTURE) ×4 IMPLANT
GAUZE SPONGE 4X4 12PLY STRL (GAUZE/BANDAGES/DRESSINGS) ×5 IMPLANT
GLOVE BIO SURGEON STRL SZ 6 (GLOVE) IMPLANT
GLOVE BIO SURGEON STRL SZ 6.5 (GLOVE) ×5 IMPLANT
GLOVE BIO SURGEON STRL SZ7 (GLOVE) IMPLANT
GLOVE BIO SURGEON STRL SZ7.5 (GLOVE) ×3 IMPLANT
GLOVE ORTHO TXT STRL SZ7.5 (GLOVE) ×9 IMPLANT
GOWN STRL REUS W/ TWL LRG LVL3 (GOWN DISPOSABLE) ×8 IMPLANT
GOWN STRL REUS W/TWL LRG LVL3 (GOWN DISPOSABLE) ×24
GRAFT VALVE AORTIC CRYO 23 (Valve) IMPLANT
HEMOSTAT POWDER SURGIFOAM 1G (HEMOSTASIS) ×9 IMPLANT
INSERT FOGARTY XLG (MISCELLANEOUS) ×3 IMPLANT
IV NS IRRIG 3000ML ARTHROMATIC (IV SOLUTION) ×1 IMPLANT
KIT BASIN OR (CUSTOM PROCEDURE TRAY) ×3 IMPLANT
KIT DILATOR VASC 18G NDL (KITS) ×1 IMPLANT
KIT DRAINAGE VACCUM ASSIST (KITS) ×1 IMPLANT
KIT SUCTION CATH 14FR (SUCTIONS) ×9 IMPLANT
KIT TURNOVER KIT B (KITS) ×3 IMPLANT
LEAD PACING MYOCARDI (MISCELLANEOUS) ×3 IMPLANT
LINE VENT (MISCELLANEOUS) ×1 IMPLANT
NDL SUT PASSING CERCLAG MED (SUTURE) IMPLANT
NDL SUT PASSING CERCLAGE MED (SUTURE) ×6
NEEDLE SUT PASSING CERCLAG MED (SUTURE) ×4 IMPLANT
NS IRRIG 1000ML POUR BTL (IV SOLUTION) ×15 IMPLANT
PACK E OPEN HEART (SUTURE) ×3 IMPLANT
PACK OPEN HEART (CUSTOM PROCEDURE TRAY) ×3 IMPLANT
PAD ARMBOARD 7.5X6 YLW CONV (MISCELLANEOUS) ×6 IMPLANT
PERCLOSE PROGLIDE 6F (VASCULAR PRODUCTS) ×6
POSITIONER HEAD DONUT 9IN (MISCELLANEOUS) ×3 IMPLANT
SET CARDIOPLEGIA MPS 5001102 (MISCELLANEOUS) ×1 IMPLANT
SET IRRIG TUBING LAPAROSCOPIC (IRRIGATION / IRRIGATOR) ×4 IMPLANT
SHEATH PINNACLE 8F 10CM (SHEATH) ×1 IMPLANT
SOL ANTI FOG 6CC (MISCELLANEOUS) IMPLANT
SOLUTION ANTI FOG 6CC (MISCELLANEOUS) ×1
SPONGE LAP 4X18 RFD (DISPOSABLE) ×1 IMPLANT
SUT BONE WAX W31G (SUTURE) ×3 IMPLANT
SUT ETHIBON 2 0 V 52N 30 (SUTURE) ×6 IMPLANT
SUT ETHIBON EXCEL 2-0 V-5 (SUTURE) IMPLANT
SUT ETHIBOND 2 0 SH (SUTURE)
SUT ETHIBOND 2 0 SH 36X2 (SUTURE) IMPLANT
SUT ETHIBOND 2 0 V4 (SUTURE) IMPLANT
SUT ETHIBOND 2 0V4 GREEN (SUTURE) IMPLANT
SUT ETHIBOND 4 0 RB 1 (SUTURE) ×4 IMPLANT
SUT ETHIBOND V-5 VALVE (SUTURE) IMPLANT
SUT ETHIBOND X763 2 0 SH 1 (SUTURE) ×11 IMPLANT
SUT MNCRL AB 3-0 PS2 18 (SUTURE) ×6 IMPLANT
SUT PDS AB 1 CTX 36 (SUTURE) ×6 IMPLANT
SUT PROLENE 3 0 SH1 36 (SUTURE) ×14 IMPLANT
SUT PROLENE 4 0 RB 1 (SUTURE) ×51
SUT PROLENE 4 0 SH DA (SUTURE) ×4 IMPLANT
SUT PROLENE 4-0 RB1 .5 CRCL 36 (SUTURE) ×4 IMPLANT
SUT PROLENE 5 0 C 1 36 (SUTURE) ×9 IMPLANT
SUT PROLENE 6 0 C 1 30 (SUTURE) IMPLANT
SUT SILK  1 MH (SUTURE) ×15
SUT SILK 1 MH (SUTURE) ×2 IMPLANT
SUT SILK 2 0 SH CR/8 (SUTURE) IMPLANT
SUT SILK 3 0 SH CR/8 (SUTURE) IMPLANT
SUT STEEL 6MS V (SUTURE) IMPLANT
SUT VIC AB 2-0 CTX 27 (SUTURE) ×3 IMPLANT
SWAB COLLECTION DEVICE MRSA (MISCELLANEOUS) ×1 IMPLANT
SWAB CULTURE LIQUID MINI MALE (MISCELLANEOUS) ×1 IMPLANT
SYR 10ML KIT SKIN ADHESIVE (MISCELLANEOUS) IMPLANT
SYR BULB IRRIG 60ML STRL (SYRINGE) ×2 IMPLANT
SYSTEM SAHARA CHEST DRAIN ATS (WOUND CARE) ×4 IMPLANT
TAPE CLOTH SURG 4X10 WHT LF (GAUZE/BANDAGES/DRESSINGS) ×1 IMPLANT
TAPE PAPER 2X10 WHT MICROPORE (GAUZE/BANDAGES/DRESSINGS) ×1 IMPLANT
TOWEL GREEN STERILE (TOWEL DISPOSABLE) ×3 IMPLANT
TOWEL GREEN STERILE FF (TOWEL DISPOSABLE) ×3 IMPLANT
TRAY FOLEY SLVR 14FR TEMP STAT (SET/KITS/TRAYS/PACK) ×3 IMPLANT
TUBE SUCT INTRACARD DLP 20F (MISCELLANEOUS) ×1 IMPLANT
UNDERPAD 30X36 HEAVY ABSORB (UNDERPADS AND DIAPERS) ×3 IMPLANT
VALVE AORTIC CRYO 23MM (Valve) ×3 IMPLANT
VENT LEFT HEART 12002 (CATHETERS) ×3
WATER STERILE IRR 1000ML POUR (IV SOLUTION) ×6 IMPLANT
WIRE EMERALD 3MM-J .035X150CM (WIRE) ×1 IMPLANT

## 2020-05-15 NOTE — Anesthesia Procedure Notes (Signed)
Central Venous Catheter Insertion Performed by: Albertha Ghee, MD, anesthesiologist Start/End9/23/2021 7:10 AM, 05/15/2020 7:22 AM Patient location: Pre-op. Preanesthetic checklist: patient identified, IV checked, site marked, risks and benefits discussed, surgical consent, monitors and equipment checked, pre-op evaluation, timeout performed and anesthesia consent Position: Trendelenburg Lidocaine 1% used for infiltration and patient sedated Hand hygiene performed , maximum sterile barriers used  and Seldinger technique used Catheter size: 9 Fr Central line and PA cath was placed.MAC introducer Swan type:thermodilation Procedure performed using ultrasound guided technique. Ultrasound Notes:anatomy identified, needle tip was noted to be adjacent to the nerve/plexus identified, no ultrasound evidence of intravascular and/or intraneural injection and image(s) printed for medical record Attempts: 1 Following insertion, line sutured, dressing applied and Biopatch. Post procedure assessment: blood return through all ports, free fluid flow and no air  Patient tolerated the procedure well with no immediate complications.

## 2020-05-15 NOTE — Progress Notes (Signed)
PHARMACY - PHYSICIAN COMMUNICATION CRITICAL VALUE ALERT - BLOOD CULTURE IDENTIFICATION (BCID)  Rohn Fritsch. is an 74 y.o. male who presented to [facility] on 05/13/2020 with a chief complaint of endocarditis   ### Repeat cultures from 9/21 positive for gram positive cocci in chains, lab not repeating BCID ######  Name of physician (or Provider) Contacted: Dr. Stanford Breed   Current antibiotics: Ceftriaxone  Changes to prescribed antibiotics recommended:  Patient is on recommended antibiotics - No changes needed  Narda Bonds, PharmD, [place] Pharmacist Phone: [phone]

## 2020-05-15 NOTE — Anesthesia Procedure Notes (Signed)
Arterial Line Insertion Start/End9/23/2021 6:45 AM, 05/15/2020 7:00 AM Performed by: Lance Coon, CRNA, CRNA  Preanesthetic checklist: patient identified, IV checked, site marked, risks and benefits discussed, surgical consent, monitors and equipment checked, pre-op evaluation, timeout performed and anesthesia consent Lidocaine 1% used for infiltration Right, radial was placed Catheter size: 20 G Hand hygiene performed , maximum sterile barriers used  and Seldinger technique used  Attempts: 2 Procedure performed without using ultrasound guided technique. Following insertion, dressing applied and Biopatch. Post procedure assessment: normal and unchanged  Patient tolerated the procedure well with no immediate complications.

## 2020-05-15 NOTE — Progress Notes (Signed)
Echocardiogram Echocardiogram Transesophageal has been performed.  Oneal Deputy Shakiera Edelson 05/15/2020, 9:03 AM

## 2020-05-15 NOTE — Transfer of Care (Signed)
Immediate Anesthesia Transfer of Care Note  Patient: Matthew Serrano.  Procedure(s) Performed: AORTIC VALVE REPLACEMENT (AVR) SIZE 23 MM,  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 (N/A Chest) HUMAN ALLOGRAFT AORTIC ROOT REPLACEMENT (N/A Chest) TRANSESOPHAGEAL ECHOCARDIOGRAM (TEE) (N/A )  Patient Location: SICU  Anesthesia Type:General  Level of Consciousness: sedated, patient cooperative and Patient remains intubated per anesthesia plan  Airway & Oxygen Therapy: Patient remains intubated per anesthesia plan and Patient placed on Ventilator (see vital sign flow sheet for setting)  Post-op Assessment: Report given to RN and Post -op Vital signs reviewed and stable  Post vital signs: Reviewed and stable  Last Vitals:  Vitals Value Taken Time  BP    Temp 34.7 C 05/15/20 1625  Pulse 63 05/15/20 1625  Resp 12 05/15/20 1625  SpO2 98 % 05/15/20 1625  Vitals shown include unvalidated device data.  Last Pain:  Vitals:   05/15/20 0535  TempSrc:   PainSc: 0-No pain      Patients Stated Pain Goal: 5 (76/16/07 3710)  Complications: No complications documented.

## 2020-05-15 NOTE — Progress Notes (Signed)
      [address]       [place] [address]             [phone]     CARDIOTHORACIC SURGERY PROGRESS NOTE  Subjective: Matthew Serrano. has been scheduled for AORTIC VALVE REPLACEMENT, POSSIBLE HUMAN ALLOGRAFT AORTIC ROOT REPLACEMENT, REPAIR OF AORTA TO PULMONARY ARTERY FISTULA today.   Objective: Vital signs in last 24 hours: Temp:  [98.4 F (36.9 C)-100.6 F (38.1 C)] 100.6 F (38.1 C) (09/23 0400) Pulse Rate:  [86-119] 116 (09/23 0500) Cardiac Rhythm: Normal sinus rhythm (09/22 2000) Resp:  [10-30] 26 (09/23 0500) BP: (112-156)/(42-105) 138/55 (09/23 0500) SpO2:  [93 %-100 %] 96 % (09/23 0500) Weight:  [93.5 kg-98.3 kg] 93.5 kg (09/23 0544)  Physical Exam: Unchanged from previously   Intake/Output from previous day: 09/22 0701 - 09/23 0700 In: 403.1 [I.V.:303.1; IV Piggyback:100] Out: 3300 [Urine:3300] Intake/Output this shift: Total I/O In: -  Out: 1700 [Urine:1700]  Lab Results: Recent Labs    05/14/20 0505 05/15/20 0157  WBC 12.2* 12.4*  HGB 7.7* 8.2*  HCT 24.0* 25.6*  PLT 245 283   BMET:  Recent Labs    05/14/20 2226 05/15/20 0157  NA 140 141  K 3.4* 2.8*  CL 109 108  CO2 19* 21*  GLUCOSE 99 101*  BUN 10 8  CREATININE 0.93 0.93  CALCIUM 8.0* 8.0*    CBG (last 3)  Recent Labs    05/14/20 2023 05/14/20 2336 05/15/20 0357  GLUCAP 92 92 100*   PT/INR:   Recent Labs    05/14/20 2226  LABPROT 15.4*  INR 1.3*    Assessment/Plan:   The various methods of treatment have been discussed with the patient. After consideration of the risks, benefits and treatment options the patient has consented to the planned procedure.   The patient has been seen and labs reviewed. There are no changes in the patient's condition to prevent proceeding with the planned procedure today.   Rexene Alberts, MD 05/15/2020 5:55 AM

## 2020-05-15 NOTE — Op Note (Addendum)
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  Transesophageal echocardiogram was performed this afternoon by Dr. 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  Urgent cardiac surgical consultation was requested.  The patient has been seen in consultation and counseled at length regarding the indications, risks and potential benefits of  surgery.  All questions have been answered, and the patient provides full informed consent for the operation as described.    DETAILS OF THE OPERATIVE PROCEDURE  Preparation:  The patient is brought to the operating room on the above mentioned date and central monitoring was established by the anesthesia team including placement of Swan-Ganz catheter and radial arterial line. The patient is placed in the supine position on the operating table.  Intravenous antibiotics are administered. General endotracheal anesthesia is induced uneventfully. A Foley catheter is placed.  Baseline transesophageal echocardiogram was performed.  Findings were notable for severe, essentially wide open aortic insufficiency.  The aortic valve was trileaflet with significant calcification of the aortic leaflets.  However, the right and left coronary leaflets were largely destroyed by active bacterial endocarditis with obvious vegetations.  There was what appeared to be an abscess adjacent to the left right commissure at the base of the left sinus of Valsalva.  This abscess communicated with the pulmonary artery well above the pulmonic valve.  There was severe left to right shunt.  The left ventricle appears hyperdynamic.  There was mild central mitral regurgitation.  There was no sign of any vegetation on the mitral valve.  Right ventricular size and function was normal.  The patient's chest, abdomen, both groins, and both lower extremities are prepared and draped in a sterile manner. A time out procedure is performed.   Percutaneous Vascular Access:  Percutaneous venous access were obtained on the right side.  Using ultrasound guidance the right common femoral vein was cannulated using the Seldinger technique and a pair of Perclose vascular closure devises were placed at opposing 30 degree angles, after which time an 8 French sheath inserted.     Surgical Approach:  A median sternotomy incision  was performed and the  pericardium is opened. The ascending aorta is normal in appearance, although there is acute inflammation involving the proximal aorta particularly in between the pulmonary artery and the aortic root.   Extracorporeal Cardiopulmonary Bypass and Myocardial Protection:  The patient was heparinized systemically.  The transverse aortic arch is cannulated directly using a 20 French arterial cannula placed using Seldinger technique.  The right common femoral vein is cannulated through the venous sheath and a guidewire advanced into the right atrium using TEE guidance.  The femoral vein cannulated using a 25 Fr long femoral venous cannula.   Adequate heparinization is verified.  A retrograde cardioplegia cannula is placed through the right atrium into the coronary sinus.  The operative field was continuously flooded with carbon dioxide gas.  The entire pre-bypass portion of the operation was notable for stable hemodynamics.  Cardiopulmonary bypass was begun.  A second venous cannula was placed directly in the superior vena cava.  A left ventricular vent is placed through the right superior pulmonary vein.  A cardioplegia cannula is placed in the ascending aorta.  A temperature probe was placed in the interventricular septum.  The patient is cooled to 28C systemic temperature.  The aortic cross clamp is applied and cardioplegia is delivered initially in an antegrade fashion through the aortic root using modified [place] cold blood cardioplegia (Kennestone blood cardioplegia protocol).   Soon as ventricular fibrillation ensues antegrade cardioplegia is stopped and the remainder of the arresting doses administered retrograde through the coronary sinus catheter.  Repeat doses of cardioplegia are administered at 90 minutes and every 30 minutes thereafter through the coronary sinus catheter in order to maintain completely flat electrocardiogram.  Myocardial protection was felt to be excellent.   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  A low transverse aortotomy incision is performed approximately 2 cm above the insertion of the right coronary artery.  The aortic valve was inspected.  The aortic valve is trileaflet with obvious bulky vegetations from acute bacterial endocarditis that were predominantly adherent to the left coronary leaflet in the left right commissure.  Much of the left and right coronary leaflets were destroyed.  There was calcification in the remaining leaflets consistent with the patient's previous known diagnosis of aortic stenosis.  Portions of the vegetations are sent for stat Gram stain and culture.  The aortic valve leaflets and all of the vegetations are excised sharply.  Once the leaflets and vegetations were removed an obvious abscess was identified at the base of the left sinus of Valsalva adjacent to both the left main coronary artery and the left right commissure.  All of the dead and devitalized tissues are debrided surgically.  The aortic root is then irrigated with more than 2 L of saline solution using pulse lavage irrigation device.  The left and right coronary arteries are each mobilized on separate buttons of aortic tissue.  Mobilization of the left main coronary artery is technically challenging and tedious due to severe acute and subacute inflammation in association with the abscess.  Ultimately both the left main and right coronary arteries are satisfactorily mobilized for subsequent reimplantation.  The aortic root is sized to accept a 23 mm root graft.  A cryopreserved human aortic root allograft (LifeNet ID #[id number]) is thought and rinsed per manufacture protocol.  The abscess cavity is explored.  A fistulous connection into the main pulmonary artery is  identified adjacent to the left right commissure above the level of the annulus of the aortic valve.  The aortic  the pulmonary artery fistula is repaired using a series of multiple interrupted horizontal mattress 3-0 Prolene sutures placed using pledget material made of human allograft aorta as tissue pledgets.  The human allograft aortic root graft is carefully inspected and the proximal end trimmed of unnecessary and devitalized surrounding muscle tissue as well as the anterior leaflet of the mitral valve.  Aortic root replacement is performed using simple interrupted 4-0 Ethibond sutures placed circumferentially around the aortic root.  Portions of extra allograft aortic tissue are utilized to create a gusset incorporated in the proximal suture line in the area surrounding the abscess where aortic root tissue was inflamed, boggy, and prone to tearing.  Once the aortic root graft was successfully implanted several additional 3-0 Prolene sutures buttressed with allograft aortic tissue were placed to reinforce the proximal suture line in the area of the abscess formation.  The left main and right coronary arteries are each reimplanted into their corresponding sinus of Valsalva in the allograft aortic root graft using running 5-0 Prolene suture.  The distal end of the allograft is trimmed and beveled to an appropriate length and the distal suture line performed using running 4-0 Prolene suture.   Procedure Completion:  One final dose of warm retrograde "reanimation dose" cardioplegia was administered retrograde through the coronary sinus catheter while all air was evacuated through the aortic root.  The aortic cross clamp was removed after a total cross clamp time of 216 minutes.  Epicardial pacing wires are fixed to the right ventricular outflow tract and to the right atrial appendage. The patient is rewarmed to 37C temperature. The aortic and left ventricular vents are removed.  The superior vena cava cannula was removed.  The patient is weaned and disconnected from cardiopulmonary bypass.  The patient's rhythm  at separation from bypass was sinus.  The patient was weaned from cardiopulmonary bypass without any inotropic support. Total cardiopulmonary bypass time for the operation was 274 minutes.  Followup transesophageal echocardiogram performed after separation from bypass revealed a well-seated aortic valve prosthesis that was functioning normally and without any aortic insufficiency.  Left ventricular function was unchanged from preoperatively.  The aortic cannula was removed uneventfully.  The femoral venous cannula was removed and Perclose sutures secured.  Protamine was administered to reverse the anticoagulation. The mediastinum and pleural space were inspected for hemostasis and irrigated with saline solution.  There was severe coagulopathy.  The patient received a total of 2 packs adult platelets, 4 units fresh frozen plasma and 1 pack cryoprecipitate due to coagulopathy and thrombocytopenia after separation from cardiopulmonary bypass and reversal of heparin with protamine.  The mediastinum and both pleural space were drained using 4 chest tubes placed through separate stab incisions inferiorly.  The soft tissues anterior to the aorta were reapproximated loosely. The sternum is closed with double strength sternal wire. The soft tissues anterior to the sternum were closed in multiple layers and the skin is closed with a running subcuticular skin closure.  The post-bypass portion of the operation was notable for stable rhythm and hemodynamics.  The patient received 6 units packed red blood cells during the procedure due to anemia which was present preoperatively and exacerbated by acute blood loss and hemodilution during cardiopulmonary bypass.    Disposition:  The patient tolerated the procedure well and is transported to the surgical intensive care in stable condition. There are no  intraoperative complications. All sponge instrument and needle counts are verified correct at completion of the  operation.    Valentina Gu. Roxy Manns MD 05/15/2020 3:42 PM

## 2020-05-15 NOTE — Anesthesia Postprocedure Evaluation (Signed)
Anesthesia Post Note  Patient: Matthew Serrano.  Procedure(s) Performed: AORTIC VALVE REPLACEMENT (AVR) SIZE 23 MM,  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 (N/A Chest) HUMAN ALLOGRAFT AORTIC ROOT REPLACEMENT (N/A Chest) TRANSESOPHAGEAL ECHOCARDIOGRAM (TEE) (N/A )     Patient location during evaluation: SICU Anesthesia Type: General Level of consciousness: sedated Pain management: pain level controlled Vital Signs Assessment: post-procedure vital signs reviewed and stable Respiratory status: patient remains intubated per anesthesia plan Cardiovascular status: stable Postop Assessment: no apparent nausea or vomiting Anesthetic complications: no   No complications documented.  Last Vitals:  Vitals:   05/15/20 1715 05/15/20 1730  BP:    Pulse: 80 80  Resp: 12 (!) 6  Temp: (!) 35 C (!) 35.2 C  SpO2: 97% 97%    Last Pain:  Vitals:   05/15/20 1630  TempSrc: Core (Comment)  PainSc:                  Danyael Alipio,W. EDMOND

## 2020-05-15 NOTE — Plan of Care (Signed)
  Problem: Nutrition: Goal: Adequate nutrition will be maintained Outcome: Progressing   Problem: Respiratory: Goal: Ability to maintain a clear airway and adequate ventilation will improve Outcome: Progressing   Problem: Respiratory: Goal: Respiratory status will improve Outcome: Progressing

## 2020-05-15 NOTE — Anesthesia Procedure Notes (Addendum)
Procedure Name: Intubation Date/Time: 05/15/2020 8:06 AM Performed by: Lance Coon, CRNA Pre-anesthesia Checklist: Patient identified, Emergency Drugs available, Suction available, Patient being monitored and Timeout performed Patient Re-evaluated:Patient Re-evaluated prior to induction Oxygen Delivery Method: Circle system utilized Preoxygenation: Pre-oxygenation with 100% oxygen Induction Type: IV induction Ventilation: Mask ventilation without difficulty Laryngoscope Size: Miller and 3 Grade View: Grade I Tube type: Oral Tube size: 8.0 mm Number of attempts: 1 Airway Equipment and Method: Stylet Placement Confirmation: ETT inserted through vocal cords under direct vision,  positive ETCO2 and breath sounds checked- equal and bilateral Secured at: 22 cm Tube secured with: Tape Dental Injury: Teeth and Oropharynx as per pre-operative assessment

## 2020-05-15 NOTE — Brief Op Note (Signed)
05/13/2020 - 05/15/2020  3:38 PM  PATIENT:  Matthew Serrano.  74 y.o. male  PRE-OPERATIVE DIAGNOSIS:  SEVERE AI ENDOCARDITIS  POST-OPERATIVE DIAGNOSIS:  SEVERE AI ENDOCARDITIS  PROCEDURE:  Procedure(s): AORTIC VALVE REPLACEMENT (AVR) SIZE 23 MM,  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 (N/A) HUMAN ALLOGRAFT AORTIC ROOT REPLACEMENT (N/A) TRANSESOPHAGEAL ECHOCARDIOGRAM (TEE) (N/A)  SURGEON:  Surgeon(s) and Role:    Rexene Alberts, MD - Primary  PHYSICIAN ASSISTANT: WAYNE GOLD PA-C  ASSISTANTS: STAFF   ANESTHESIA:   general  EBL:  4200 mL   BLOOD ADMINISTERED:6 UNITS PRBC'S, PLATELETS, FFP , CRYO  DRAINS: MEDIASTINAL CHEST TUBES   LOCAL MEDICATIONS USED:  NONE  SPECIMEN:  Source of Specimen:  AORTIC VALVE   DISPOSITION OF SPECIMEN:  PATH/MICRO  COUNTS:  YES  TOURNIQUET:  * No tourniquets in log *  DICTATION: .Dragon Dictation  PLAN OF CARE: Admit to inpatient   PATIENT DISPOSITION:  ICU - intubated and hemodynamically stable.   Delay start of Pharmacological VTE agent (>24hrs) due to surgical blood loss or risk of bleeding: yes  COMPLICATIONS: NO KNOWN

## 2020-05-15 NOTE — Progress Notes (Signed)
TCTS BRIEF SICU PROGRESS NOTE  Day of Surgery  S/P Procedure(s) (LRB): AORTIC VALVE REPLACEMENT (AVR) SIZE 23 MM,  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 (N/A) HUMAN ALLOGRAFT AORTIC ROOT REPLACEMENT (N/A) TRANSESOPHAGEAL ECHOCARDIOGRAM (TEE) (N/A)   Sedated on vent AAI paced w/ stable hemodynamics no drips O2 sats 100% Chest tube output low UOP excellent Labs okay  Plan: Continue routine early postop  Rexene Alberts, MD 05/15/2020 6:58 PM

## 2020-05-16 ENCOUNTER — Encounter (HOSPITAL_COMMUNITY): Payer: Self-pay | Admitting: Thoracic Surgery (Cardiothoracic Vascular Surgery)

## 2020-05-16 ENCOUNTER — Other Ambulatory Visit: Payer: Self-pay | Admitting: Cardiology

## 2020-05-16 ENCOUNTER — Inpatient Hospital Stay: Payer: Medicare Other | Admitting: Adult Health

## 2020-05-16 ENCOUNTER — Inpatient Hospital Stay (HOSPITAL_COMMUNITY): Payer: Medicare Other

## 2020-05-16 DIAGNOSIS — I358 Other nonrheumatic aortic valve disorders: Secondary | ICD-10-CM

## 2020-05-16 DIAGNOSIS — Z954 Presence of other heart-valve replacement: Secondary | ICD-10-CM

## 2020-05-16 DIAGNOSIS — B955 Unspecified streptococcus as the cause of diseases classified elsewhere: Secondary | ICD-10-CM

## 2020-05-16 DIAGNOSIS — I772 Rupture of artery: Secondary | ICD-10-CM

## 2020-05-16 DIAGNOSIS — I351 Nonrheumatic aortic (valve) insufficiency: Secondary | ICD-10-CM

## 2020-05-16 LAB — BASIC METABOLIC PANEL
Anion gap: 11 (ref 5–15)
Anion gap: 10 (ref 5–15)
Anion gap: 12 (ref 5–15)
BUN: 6 mg/dL — ABNORMAL LOW (ref 8–23)
BUN: 6 mg/dL — ABNORMAL LOW (ref 8–23)
BUN: 6 mg/dL — ABNORMAL LOW (ref 8–23)
CO2: 23 mmol/L (ref 22–32)
CO2: 24 mmol/L (ref 22–32)
CO2: 23 mmol/L (ref 22–32)
Calcium: 7.6 mg/dL — ABNORMAL LOW (ref 8.9–10.3)
Calcium: 7.5 mg/dL — ABNORMAL LOW (ref 8.9–10.3)
Calcium: 7.6 mg/dL — ABNORMAL LOW (ref 8.9–10.3)
Chloride: 112 mmol/L — ABNORMAL HIGH (ref 98–111)
Chloride: 110 mmol/L (ref 98–111)
Chloride: 108 mmol/L (ref 98–111)
Creatinine, Ser: 0.71 mg/dL (ref 0.61–1.24)
Creatinine, Ser: 0.92 mg/dL (ref 0.61–1.24)
Creatinine, Ser: 0.95 mg/dL (ref 0.61–1.24)
GFR calc Af Amer: >60 mL/min (ref >60)
GFR calc Af Amer: >60 mL/min (ref >60)
GFR calc Af Amer: >60 mL/min (ref >60)
GFR calc non Af Amer: >60 mL/min (ref >60)
GFR calc non Af Amer: >60 mL/min (ref >60)
GFR calc non Af Amer: >60 mL/min (ref >60)
Glucose, Bld: 95 mg/dL (ref 70–99)
Glucose, Bld: 125 mg/dL — ABNORMAL HIGH (ref 70–99)
Glucose, Bld: 139 mg/dL — ABNORMAL HIGH (ref 70–99)
Potassium: 3.2 mmol/L — ABNORMAL LOW (ref 3.5–5.1)
Potassium: 3.6 mmol/L (ref 3.5–5.1)
Potassium: 4.1 mmol/L (ref 3.5–5.1)
Sodium: 146 mmol/L — ABNORMAL HIGH (ref 135–145)
Sodium: 144 mmol/L (ref 135–145)
Sodium: 143 mmol/L (ref 135–145)

## 2020-05-16 LAB — BPAM FFP
Blood Product Expiration Date: 202109252359
Blood Product Expiration Date: 202109262359
Blood Product Expiration Date: 202109282359
Blood Product Expiration Date: 202109282359
ISSUE DATE / TIME: 202109231401
ISSUE DATE / TIME: 202109231401
ISSUE DATE / TIME: 202109231401
ISSUE DATE / TIME: 202109231401
Unit Type and Rh: 6200
Unit Type and Rh: 6200
Unit Type and Rh: 6200
Unit Type and Rh: 6200

## 2020-05-16 LAB — GLUCOSE, CAPILLARY
Glucose-Capillary: 103 mg/dL — ABNORMAL HIGH (ref 70–99)
Glucose-Capillary: 105 mg/dL — ABNORMAL HIGH (ref 70–99)
Glucose-Capillary: 106 mg/dL — ABNORMAL HIGH (ref 70–99)
Glucose-Capillary: 106 mg/dL — ABNORMAL HIGH (ref 70–99)
Glucose-Capillary: 110 mg/dL — ABNORMAL HIGH (ref 70–99)
Glucose-Capillary: 114 mg/dL — ABNORMAL HIGH (ref 70–99)
Glucose-Capillary: 117 mg/dL — ABNORMAL HIGH (ref 70–99)
Glucose-Capillary: 121 mg/dL — ABNORMAL HIGH (ref 70–99)
Glucose-Capillary: 136 mg/dL — ABNORMAL HIGH (ref 70–99)
Glucose-Capillary: 151 mg/dL — ABNORMAL HIGH (ref 70–99)
Glucose-Capillary: 88 mg/dL (ref 70–99)
Glucose-Capillary: 92 mg/dL (ref 70–99)
Glucose-Capillary: 93 mg/dL (ref 70–99)
Glucose-Capillary: 99 mg/dL (ref 70–99)

## 2020-05-16 LAB — PREPARE PLATELET PHERESIS
Unit division: 0
Unit division: 0

## 2020-05-16 LAB — SURGICAL PATHOLOGY

## 2020-05-16 LAB — CBC
HCT: 36.4 % — ABNORMAL LOW (ref 39.0–52.0)
HCT: 37.6 % — ABNORMAL LOW (ref 39.0–52.0)
HCT: 37.3 % — ABNORMAL LOW (ref 39.0–52.0)
Hemoglobin: 12.1 g/dL — ABNORMAL LOW (ref 13.0–17.0)
Hemoglobin: 12.5 g/dL — ABNORMAL LOW (ref 13.0–17.0)
Hemoglobin: 12.1 g/dL — ABNORMAL LOW (ref 13.0–17.0)
MCH: 28.2 pg (ref 26.0–34.0)
MCH: 28.5 pg (ref 26.0–34.0)
MCH: 28.1 pg (ref 26.0–34.0)
MCHC: 33.2 g/dL (ref 30.0–36.0)
MCHC: 33.2 g/dL (ref 30.0–36.0)
MCHC: 32.4 g/dL (ref 30.0–36.0)
MCV: 84.8 fL (ref 80.0–100.0)
MCV: 85.8 fL (ref 80.0–100.0)
MCV: 86.7 fL (ref 80.0–100.0)
Platelets: 177 10*3/uL (ref 150–400)
Platelets: 194 10*3/uL (ref 150–400)
Platelets: 192 10*3/uL (ref 150–400)
RBC: 4.29 MIL/uL (ref 4.22–5.81)
RBC: 4.38 MIL/uL (ref 4.22–5.81)
RBC: 4.3 MIL/uL (ref 4.22–5.81)
RDW: 16 % — ABNORMAL HIGH (ref 11.5–15.5)
RDW: 16.4 % — ABNORMAL HIGH (ref 11.5–15.5)
RDW: 16.6 % — ABNORMAL HIGH (ref 11.5–15.5)
WBC: 11.9 10*3/uL — ABNORMAL HIGH (ref 4.0–10.5)
WBC: 13.2 10*3/uL — ABNORMAL HIGH (ref 4.0–10.5)
WBC: 14.4 10*3/uL — ABNORMAL HIGH (ref 4.0–10.5)
nRBC: 0 % (ref 0.0–0.2)
nRBC: 0 % (ref 0.0–0.2)
nRBC: 0.1 % (ref 0.0–0.2)

## 2020-05-16 LAB — PREPARE FRESH FROZEN PLASMA: Unit division: 0

## 2020-05-16 LAB — BPAM PLATELET PHERESIS
Blood Product Expiration Date: 202109252359
Blood Product Expiration Date: 202109252359
ISSUE DATE / TIME: 202109231336
ISSUE DATE / TIME: 202109231336
Unit Type and Rh: 6200
Unit Type and Rh: 6200

## 2020-05-16 LAB — POCT I-STAT 7, (LYTES, BLD GAS, ICA,H+H)
Acid-Base Excess: 1 mmol/L (ref 0.0–2.0)
Acid-Base Excess: 1 mmol/L (ref 0.0–2.0)
Acid-base deficit: 1 mmol/L (ref 0.0–2.0)
Bicarbonate: 23.2 mmol/L (ref 20.0–28.0)
Bicarbonate: 24.1 mmol/L (ref 20.0–28.0)
Bicarbonate: 24.9 mmol/L (ref 20.0–28.0)
Calcium, Ion: 1.09 mmol/L — ABNORMAL LOW (ref 1.15–1.40)
Calcium, Ion: 1.13 mmol/L — ABNORMAL LOW (ref 1.15–1.40)
Calcium, Ion: 1.13 mmol/L — ABNORMAL LOW (ref 1.15–1.40)
HCT: 34 % — ABNORMAL LOW (ref 39.0–52.0)
HCT: 35 % — ABNORMAL LOW (ref 39.0–52.0)
HCT: 35 % — ABNORMAL LOW (ref 39.0–52.0)
Hemoglobin: 11.6 g/dL — ABNORMAL LOW (ref 13.0–17.0)
Hemoglobin: 11.9 g/dL — ABNORMAL LOW (ref 13.0–17.0)
Hemoglobin: 11.9 g/dL — ABNORMAL LOW (ref 13.0–17.0)
O2 Saturation: 92 %
O2 Saturation: 95 %
O2 Saturation: 97 %
Patient temperature: 37.1
Patient temperature: 37.1
Patient temperature: 37.1
Potassium: 2.9 mmol/L — ABNORMAL LOW (ref 3.5–5.1)
Potassium: 3 mmol/L — ABNORMAL LOW (ref 3.5–5.1)
Potassium: 3.2 mmol/L — ABNORMAL LOW (ref 3.5–5.1)
Sodium: 145 mmol/L (ref 135–145)
Sodium: 147 mmol/L — ABNORMAL HIGH (ref 135–145)
Sodium: 147 mmol/L — ABNORMAL HIGH (ref 135–145)
TCO2: 24 mmol/L (ref 22–32)
TCO2: 25 mmol/L (ref 22–32)
TCO2: 26 mmol/L (ref 22–32)
pCO2 arterial: 31.9 mmHg — ABNORMAL LOW (ref 32.0–48.0)
pCO2 arterial: 34.9 mmHg (ref 32.0–48.0)
pCO2 arterial: 37.7 mmHg (ref 32.0–48.0)
pH, Arterial: 7.428 (ref 7.350–7.450)
pH, Arterial: 7.431 (ref 7.350–7.450)
pH, Arterial: 7.487 — ABNORMAL HIGH (ref 7.350–7.450)
pO2, Arterial: 62 mmHg — ABNORMAL LOW (ref 83.0–108.0)
pO2, Arterial: 69 mmHg — ABNORMAL LOW (ref 83.0–108.0)
pO2, Arterial: 84 mmHg (ref 83.0–108.0)

## 2020-05-16 LAB — PREPARE CRYOPRECIPITATE: Unit division: 0

## 2020-05-16 LAB — BPAM CRYOPRECIPITATE
Blood Product Expiration Date: 202109231930
ISSUE DATE / TIME: 202109231350
Unit Type and Rh: 6200

## 2020-05-16 LAB — MAGNESIUM
Magnesium: 2.3 mg/dL (ref 1.7–2.4)
Magnesium: 2.1 mg/dL (ref 1.7–2.4)

## 2020-05-16 MED ORDER — METOPROLOL TARTRATE 12.5 MG HALF TABLET
12.5000 mg | ORAL_TABLET | Freq: Two times a day (BID) | ORAL | Status: DC
  Administered 2020-05-16 – 2020-05-17 (×4): 12.5 mg via ORAL
  Filled 2020-05-16 (×4): qty 1

## 2020-05-16 MED ORDER — ENOXAPARIN SODIUM 30 MG/0.3ML ~~LOC~~ SOLN
30.0000 mg | Freq: Every day | SUBCUTANEOUS | Status: DC
  Administered 2020-05-17 – 2020-05-22 (×6): 30 mg via SUBCUTANEOUS
  Filled 2020-05-16 (×6): qty 0.3

## 2020-05-16 MED ORDER — ORAL CARE MOUTH RINSE
15.0000 mL | Freq: Two times a day (BID) | OROMUCOSAL | Status: DC
  Administered 2020-05-16 – 2020-05-23 (×11): 15 mL via OROMUCOSAL

## 2020-05-16 MED ORDER — POTASSIUM CHLORIDE 10 MEQ/50ML IV SOLN
10.0000 meq | INTRAVENOUS | Status: DC
  Administered 2020-05-16: 10 meq via INTRAVENOUS
  Filled 2020-05-16 (×3): qty 50

## 2020-05-16 MED ORDER — POTASSIUM CHLORIDE 10 MEQ/50ML IV SOLN
10.0000 meq | INTRAVENOUS | Status: AC
  Administered 2020-05-16 (×6): 10 meq via INTRAVENOUS
  Filled 2020-05-16 (×4): qty 50

## 2020-05-16 MED ORDER — FUROSEMIDE 10 MG/ML IJ SOLN
40.0000 mg | Freq: Two times a day (BID) | INTRAMUSCULAR | Status: DC
  Administered 2020-05-16 – 2020-05-17 (×3): 40 mg via INTRAVENOUS
  Filled 2020-05-16 (×3): qty 4

## 2020-05-16 MED ORDER — INSULIN ASPART 100 UNIT/ML ~~LOC~~ SOLN
0.0000 [IU] | SUBCUTANEOUS | Status: DC
  Administered 2020-05-16 (×2): 2 [IU] via SUBCUTANEOUS

## 2020-05-16 MED FILL — Heparin Sodium (Porcine) Inj 1000 Unit/ML: INTRAMUSCULAR | Qty: 30 | Status: AC

## 2020-05-16 MED FILL — Lidocaine HCl Local Preservative Free (PF) Inj 2%: INTRAMUSCULAR | Qty: 15 | Status: AC

## 2020-05-16 MED FILL — Potassium Chloride Inj 2 mEq/ML: INTRAVENOUS | Qty: 40 | Status: AC

## 2020-05-16 NOTE — Progress Notes (Signed)
Patient ID: Matthew Corona., male   DOB: 1945/12/29, 74 y.o.   MRN: [id number]         [facility] for Infectious Disease  Date of Admission:  05/13/2020           Day 4 ceftriaxone ASSESSMENT: He underwent aortic valve replacement and aortic root reconstruction yesterday.  Operative Gram stain shows gram-positive cocci.  Repeat blood cultures are growing strep again.  His isolate is susceptible to ceftriaxone.  PLAN: 1. Continue ceftriaxone 2. Repeat blood cultures 3. Please call Dr. 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 acetaminophen  1,000 mg Oral Q6H  . aspirin EC  325 mg Oral Daily  . bisacodyl  10 mg Oral Daily   Or  . bisacodyl  10 mg Rectal Daily  . Chlorhexidine Gluconate Cloth  6 each Topical Daily  . docusate sodium  200 mg Oral Daily  . [START ON 05/17/2020] enoxaparin (LOVENOX) injection  30 mg Subcutaneous QHS  . furosemide  40 mg Intravenous BID  . insulin aspart  0-24 Units Subcutaneous Q4H  . mouth rinse  15 mL Mouth Rinse BID  . metoprolol tartrate  12.5 mg Oral BID  . [START ON 05/17/2020] pantoprazole  40 mg Oral Daily  . sodium chloride flush  10-40 mL Intracatheter Q12H  . sodium chloride flush  3 mL Intravenous Q12H   Continuous Infusions: . sodium chloride Stopped (05/16/20 0950)  . albumin human    .  cefTRIAXone (ROCEPHIN)  IV 200 mL/hr at 05/16/20 1200  . cefUROXime (ZINACEF)  IV Stopped (05/16/20 0920)  . clevidipine Stopped (05/16/20 0854)  . dexmedetomidine (PRECEDEX) IV infusion Stopped (05/15/20 2104)  . insulin 0.4 Units/hr (05/16/20 0504)  . lactated ringers    . lactated ringers Stopped (05/16/20 0516)   PRN Meds:.albumin human, dextrose, metoprolol tartrate, morphine injection, ondansetron (ZOFRAN) IV, oxyCODONE, sodium chloride flush, sodium chloride flush, traMADol   SUBJECTIVE: He is feeling better postoperatively.  Review of Systems: Review of Systems  Constitutional: Negative for chills, diaphoresis and fever.  Respiratory: Negative for cough and shortness of breath.   Cardiovascular: Positive for chest pain.  Gastrointestinal: Negative for abdominal pain, diarrhea, nausea and vomiting.  Musculoskeletal: Positive for back pain.       There has been no change in his chronic mild back pain.    No Known Allergies  OBJECTIVE: Vitals:   05/16/20 1115 05/16/20 1130 05/16/20 1145 05/16/20 1200  BP:      Pulse: 88 90 90 89  Resp: 19 (!) 24 (!) 22 (!) 23  Temp:      TempSrc:      SpO2: 100% 98% 100% 97%  Weight:      Height:       Body mass index is 33.18 kg/m.  Physical Exam Constitutional:  Comments: He is sitting up in a chair watching television.  He appears comfortable.  Cardiovascular:     Rate and Rhythm: Normal rate and regular rhythm.     Heart sounds: No murmur heard.   Pulmonary:     Effort: Pulmonary effort is normal.     Breath sounds: Normal breath sounds.  Psychiatric:        Mood and Affect: Mood normal.     Lab Results Lab Results  Component Value Date   WBC 11.9 (H) 05/16/2020   HGB 12.1 (L) 05/16/2020   HCT 36.4 (L) 05/16/2020   MCV 84.8 05/16/2020   PLT 177 05/16/2020    Lab Results  Component Value Date   CREATININE 0.71 05/16/2020   BUN 6 (L) 05/16/2020   NA 146 (H) 05/16/2020   K 3.2 (L) 05/16/2020   CL 112  (H) 05/16/2020   CO2 23 05/16/2020    Lab Results  Component Value Date   ALT 30 05/14/2020   AST 28 05/14/2020   ALKPHOS 78 05/14/2020   BILITOT 1.0 05/14/2020     Microbiology: Recent Results (from the past 240 hour(s))  Urine culture     Status: Abnormal   Collection Time: 05/11/20  1:14 AM   Specimen: In/Out Cath Urine  Result Value Ref Range Status   Specimen Description   Final    IN/OUT CATH URINE Performed at [facility], [place] [address], [place], [place] [address]    Special Requests   Final    NONE Performed at [facility], [place] [address], [place], [place] [address]    Culture (A)  Final    80,000 COLONIES/mL KLEBSIELLA PNEUMONIAE 60,000 COLONIES/mL ESCHERICHIA COLI    Report Status 05/14/2020 FINAL  Final   Organism ID, Bacteria KLEBSIELLA PNEUMONIAE (A)  Final   Organism ID, Bacteria ESCHERICHIA COLI (A)  Final      Susceptibility   Escherichia coli - MIC*    AMPICILLIN <=2 SENSITIVE Sensitive     CEFAZOLIN <=4 SENSITIVE Sensitive     CEFTRIAXONE <=0.25 SENSITIVE Sensitive     CIPROFLOXACIN <=0.25 SENSITIVE Sensitive     GENTAMICIN <=1 SENSITIVE Sensitive     IMIPENEM <=0.25 SENSITIVE Sensitive     NITROFURANTOIN <=16 SENSITIVE Sensitive     TRIMETH/SULFA <=20 SENSITIVE Sensitive     AMPICILLIN/SULBACTAM <=2 SENSITIVE Sensitive     PIP/TAZO <=4 SENSITIVE Sensitive     * 60,000 COLONIES/mL ESCHERICHIA COLI   Klebsiella pneumoniae - MIC*    AMPICILLIN >=32 RESISTANT Resistant     CEFAZOLIN <=4 SENSITIVE Sensitive     CEFTRIAXONE <=0.25 SENSITIVE Sensitive     CIPROFLOXACIN <=0.25 SENSITIVE Sensitive     GENTAMICIN <=1 SENSITIVE Sensitive     IMIPENEM <=0.25 SENSITIVE Sensitive     NITROFURANTOIN 64 INTERMEDIATE Intermediate     TRIMETH/SULFA <=20 SENSITIVE Sensitive     AMPICILLIN/SULBACTAM 8 SENSITIVE Sensitive     PIP/TAZO <=4 SENSITIVE Sensitive     * 80,000 COLONIES/mL KLEBSIELLA PNEUMONIAE  Blood Culture  (routine x 2)     Status: Abnormal   Collection Time: 05/11/20  1:14 AM   Specimen: BLOOD  Result Value Ref Range Status   Specimen Description   Final    BLOOD PORTA CATH Performed at [facility], [place] [address], [place], [place] [address]    Special Requests   Final    BOTTLES DRAWN AEROBIC AND ANAEROBIC Blood Culture results may not be optimal due to  an inadequate volume of blood received in culture bottles Performed at [place] [address], [place], [place] [address]    Culture  Setup Time   Final    GRAM POSITIVE COCCI IN BOTH AEROBIC AND ANAEROBIC BOTTLES CRITICAL RESULT CALLED TO, READ BACK BY AND VERIFIED WITH: RN Matamoras [phone] FCP Performed at [facility], [place] [address], [place], [place] [address]    Culture STREPTOCOCCUS ANGINOSIS (A)  Final   Report Status 05/15/2020 FINAL  Final   Organism ID, Bacteria STREPTOCOCCUS ANGINOSIS  Final      Susceptibility   Streptococcus anginosis - MIC*    PENICILLIN <=0.06 SENSITIVE Sensitive     CEFTRIAXONE 0.25 SENSITIVE Sensitive     ERYTHROMYCIN <=0.12 SENSITIVE Sensitive     LEVOFLOXACIN 1 SENSITIVE Sensitive     VANCOMYCIN 0.5 SENSITIVE Sensitive     * STREPTOCOCCUS ANGINOSIS  Blood Culture ID Panel (Reflexed)     Status: Abnormal   Collection Time: 05/11/20  1:14 AM  Result Value Ref Range Status   Enterococcus faecalis NOT DETECTED NOT DETECTED Final   Enterococcus Faecium NOT DETECTED NOT DETECTED Final   Listeria monocytogenes NOT DETECTED NOT DETECTED Final   Staphylococcus species NOT DETECTED NOT DETECTED Final   Staphylococcus aureus (BCID) NOT DETECTED NOT DETECTED Final   Staphylococcus epidermidis NOT DETECTED NOT DETECTED Final   Staphylococcus lugdunensis NOT DETECTED NOT DETECTED Final   Streptococcus species DETECTED (A) NOT DETECTED Final    Comment: Not Enterococcus species, Streptococcus agalactiae, Streptococcus pyogenes, or Streptococcus  pneumoniae. CRITICAL RESULT CALLED TO, READ BACK BY AND VERIFIED WITH: RN JAMIE B. [phone] [id number] FCP    Streptococcus agalactiae NOT DETECTED NOT DETECTED Final   Streptococcus pneumoniae NOT DETECTED NOT DETECTED Final   Streptococcus pyogenes NOT DETECTED NOT DETECTED Final   A.calcoaceticus-baumannii NOT DETECTED NOT DETECTED Final   Bacteroides fragilis NOT DETECTED NOT DETECTED Final   Enterobacterales NOT DETECTED NOT DETECTED Final   Enterobacter cloacae complex NOT DETECTED NOT DETECTED Final   Escherichia coli NOT DETECTED NOT DETECTED Final   Klebsiella aerogenes NOT DETECTED NOT DETECTED Final   Klebsiella oxytoca NOT DETECTED NOT DETECTED Final   Klebsiella pneumoniae NOT DETECTED NOT DETECTED Final   Proteus species NOT DETECTED NOT DETECTED Final   Salmonella species NOT DETECTED NOT DETECTED Final   Serratia marcescens NOT DETECTED NOT DETECTED Final   Haemophilus influenzae NOT DETECTED NOT DETECTED Final   Neisseria meningitidis NOT DETECTED NOT DETECTED Final   Pseudomonas aeruginosa NOT DETECTED NOT DETECTED Final   Stenotrophomonas maltophilia NOT DETECTED NOT DETECTED Final   Candida albicans NOT DETECTED NOT DETECTED Final   Candida auris NOT DETECTED NOT DETECTED Final   Candida glabrata NOT DETECTED NOT DETECTED Final   Candida krusei NOT DETECTED NOT DETECTED Final   Candida parapsilosis NOT DETECTED NOT DETECTED Final   Candida tropicalis NOT DETECTED NOT DETECTED Final   Cryptococcus neoformans/gattii NOT DETECTED NOT DETECTED Final    Comment: Performed at [facility], [address], [place], [place] [address]  SARS Coronavirus 2 by RT PCR (hospital order, performed in [facility] hospital lab) Nasopharyngeal Nasopharyngeal Swab     Status: None   Collection Time: 05/11/20  1:15 AM   Specimen: Nasopharyngeal Swab  Result Value Ref Range Status   SARS Coronavirus 2 NEGATIVE NEGATIVE Final    Comment: (NOTE) SARS-CoV-2 target nucleic acids are NOT  DETECTED.  The SARS-CoV-2 RNA is generally detectable  in upper and lower respiratory specimens during the acute phase of infection. The lowest concentration of SARS-CoV-2 viral copies this assay can detect is 250 copies / mL. A negative result does not preclude SARS-CoV-2 infection and should not be used as the sole basis for treatment or other patient management decisions.  A negative result may occur with improper specimen collection / handling, submission of specimen other than nasopharyngeal swab, presence of viral mutation(s) within the areas targeted by this assay, and inadequate number of viral copies (<250 copies / mL). A negative result must be combined with clinical observations, patient history, and epidemiological information.  Fact Sheet for Patients:   StrictlyIdeas.no  Fact Sheet for Healthcare Providers: BankingDealers.co.za  This test is not yet approved or  cleared by the [place] FDA and has been authorized for detection and/or diagnosis of SARS-CoV-2 by FDA under an Emergency Use Authorization (EUA).  This EUA will remain in effect (meaning this test can be used) for the duration of the COVID-19 declaration under Section 564(b)(1) of the Act, 21 U.S.C. section 360bbb-3(b)(1), unless the authorization is terminated or revoked sooner.  Performed at [facility], [place] [address], [place], [place] [address]   Blood Culture (routine x 2)     Status: Abnormal (Preliminary result)   Collection Time: 05/13/20 12:04 PM   Specimen: BLOOD  Result Value Ref Range Status   Specimen Description   Final    BLOOD RIGHT ANTECUBITAL Performed at [place] [address] [place], [place] [address]    Special Requests   Final    BOTTLES DRAWN AEROBIC AND ANAEROBIC Blood Culture results may not be optimal due to an excessive volume of blood received in culture bottles Performed at  [place] [address], [place], [place] [address]    Culture  Setup Time   Final    GRAM POSITIVE COCCI IN CHAINS IN BOTH AEROBIC AND ANAEROBIC BOTTLES CRITICAL RESULT CALLED TO, READ BACK BY AND VERIFIED WITH: J. LEDFORD,PHARMD 0602 05/15/2020 T. TYSOR    Culture (A)  Final    STREPTOCOCCUS ANGINOSIS SUSCEPTIBILITIES PERFORMED ON PREVIOUS CULTURE WITHIN THE LAST 5 DAYS. Performed at [facility], [place] [address] [place], [place] [address]    Report Status PENDING  Incomplete  Blood Culture (routine x 2)     Status: Abnormal (Preliminary result)   Collection Time: 05/13/20 12:04 PM   Specimen: BLOOD  Result Value Ref Range Status   Specimen Description   Final    BLOOD LEFT ANTECUBITAL Performed at [place] [address], [place], [place] [address]    Special Requests   Final    BOTTLES DRAWN AEROBIC AND ANAEROBIC Blood Culture adequate volume Performed at [place] [address], [place], [place] [address]    Culture  Setup Time   Final    GRAM POSITIVE COCCI IN CHAINS IN BOTH AEROBIC AND ANAEROBIC BOTTLES CRITICAL RESULT CALLED TO, READ BACK BY AND VERIFIED WITH: J. LEDFORD,PHARMD 0602 05/15/2020 T. TYSOR    Culture (A)  Final    STREPTOCOCCUS ANGINOSIS SUSCEPTIBILITIES PERFORMED ON PREVIOUS CULTURE WITHIN THE LAST 5 DAYS. Performed at [facility], [place] [address], [place], [place] [address]    Report Status PENDING  Incomplete  SARS Coronavirus 2 by RT PCR (hospital order, performed in [facility] hospital lab) Nasopharyngeal Nasopharyngeal Swab     Status: None   Collection Time: 05/13/20 12:20 PM  Specimen: Nasopharyngeal Swab  Result Value Ref Range Status   SARS Coronavirus 2 NEGATIVE NEGATIVE Final    Comment: (NOTE) SARS-CoV-2 target nucleic acids are NOT DETECTED.  The SARS-CoV-2 RNA is generally detectable in upper and lower respiratory specimens during the acute phase of  infection. The lowest concentration of SARS-CoV-2 viral copies this assay can detect is 250 copies / mL. A negative result does not preclude SARS-CoV-2 infection and should not be used as the sole basis for treatment or other patient management decisions.  A negative result may occur with improper specimen collection / handling, submission of specimen other than nasopharyngeal swab, presence of viral mutation(s) within the areas targeted by this assay, and inadequate number of viral copies (<250 copies / mL). A negative result must be combined with clinical observations, patient history, and epidemiological information.  Fact Sheet for Patients:   StrictlyIdeas.no  Fact Sheet for Healthcare Providers: BankingDealers.co.za  This test is not yet approved or  cleared by the [place] FDA and has been authorized for detection and/or diagnosis of SARS-CoV-2 by FDA under an Emergency Use Authorization (EUA).  This EUA will remain in effect (meaning this test can be used) for the duration of the COVID-19 declaration under Section 564(b)(1) of the Act, 21 U.S.C. section 360bbb-3(b)(1), unless the authorization is terminated or revoked sooner.  Performed at [facility], [place] [address], [place], [place] [address]   Urine culture     Status: None   Collection Time: 05/13/20  1:10 PM   Specimen: In/Out Cath Urine  Result Value Ref Range Status   Specimen Description   Final    IN/OUT CATH URINE Performed at [place] [address], [place], [place] [address]    Special Requests   Final    NONE Performed at [facility], [place] [address] [place], [place] [address]    Culture   Final    NO GROWTH Performed at [facility], [place] [address], [place], [place] [address]    Report Status 05/14/2020 FINAL  Final  MRSA PCR Screening     Status: None   Collection Time: 05/14/20   3:56 PM   Specimen: Nasal Mucosa; Nasopharyngeal  Result Value Ref Range Status   MRSA by PCR NEGATIVE NEGATIVE Final    Comment:        The GeneXpert MRSA Assay (FDA approved for NASAL specimens only), is one component of a comprehensive MRSA colonization surveillance program. It is not intended to diagnose MRSA infection nor to guide or monitor treatment for MRSA infections. Performed at [facility], [place] [address], [place], [place] [address]   Surgical pcr screen     Status: None   Collection Time: 05/14/20 10:33 PM   Specimen: Nasal Mucosa; Nasal Swab  Result Value Ref Range Status   MRSA, PCR NEGATIVE NEGATIVE Final   Staphylococcus aureus NEGATIVE NEGATIVE Final    Comment: (NOTE) The Xpert SA Assay (FDA approved for NASAL specimens in patients 22 years of age and older), is one component of a comprehensive surveillance program. It is not intended to diagnose infection nor to guide or monitor treatment. Performed at [facility], [place] [address], [place], [place] [address]   Aerobic/Anaerobic Culture (surgical/deep wound)     Status: None (Preliminary result)   Collection Time: 05/15/20 10:22 AM   Specimen: Tissue  Result Value Ref Range Status   Specimen Description TISSUE AORTIC VALVE VEGETATION  Final  Special Requests A  Final   Gram Stain   Final    FEW WBC PRESENT,BOTH PMN AND MONONUCLEAR FEW GRAM NEGATIVE RODS RARE GRAM POSITIVE COCCI IN PAIRS IN CLUSTERS    Culture   Final    NO GROWTH < 24 HOURS Performed at [facility], [place] [address], [place], [place] [address]    Report Status PENDING  Incomplete    Michel Bickers, MD [facility] [phone] pager   [phone] cell 05/16/2020, 12:06 PM

## 2020-05-16 NOTE — Progress Notes (Addendum)
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 (N/A) HUMAN ALLOGRAFT AORTIC ROOT REPLACEMENT (N/A) TRANSESOPHAGEAL ECHOCARDIOGRAM (TEE) (N/A)  Total Length of Stay:  LOS: 3 days   Subjective: Feels pretty well, no specific c/o   Objective: Vital signs in last 24 hours: Temp:  [94.6 F (34.8 C)-99 F (37.2 C)] 98.4 F (36.9 C) (09/24 0645) Pulse Rate:  [63-106] 96 (09/24 0645) Cardiac Rhythm: Normal sinus rhythm;Sinus tachycardia (09/24 0400) Resp:  [0-28] 19 (09/24 0645) BP: (90-123)/(60-86) 116/70 (09/24 0615) SpO2:  [88 %-100 %] 96 % (09/24 0645) Arterial Line BP: (104-163)/(56-80) 119/58 (09/24 0645) FiO2 (%):  [40 %-70 %] 40 % (09/23 2342) Weight:  [96.1 kg] 96.1 kg (09/24 0607)  Filed Weights   05/14/20 0626 05/15/20 0544 05/16/20 0607  Weight: 98.3 kg 93.5 kg 96.1 kg    Weight change: 2.6 kg   Hemodynamic parameters for last 24 hours: PAP: (21-41)/(11-22) 26/18 CO:  [3.5 L/min-6.6 L/min] 5.7 L/min CI:  [1.7 L/min/m2-3.2 L/min/m2] 2.8 L/min/m2  Intake/Output from previous day: 09/23 0701 - 09/24 0700 In: 8289.3 [P.O.:100; I.V.:4618.2; Blood:2651; IV Piggyback:920.1] Out: 99357 [SVXBL:3903; Blood:4200; Chest Tube:850]  Intake/Output this shift: No intake/output data recorded.  Current Meds: Scheduled Meds:  acetaminophen  1,000 mg Oral Q6H   aspirin EC  325 mg Oral Daily   bisacodyl  10 mg Oral Daily   Or   bisacodyl  10 mg Rectal Daily   Chlorhexidine Gluconate Cloth  6 each Topical Daily   docusate sodium  200 mg Oral Daily   [START ON 05/17/2020] enoxaparin (LOVENOX) injection  30 mg Subcutaneous QHS   furosemide  40 mg Intravenous BID   insulin aspart  0-24 Units Subcutaneous Q4H   mouth rinse  15 mL Mouth Rinse BID    metoprolol tartrate  12.5 mg Oral BID   [START ON 05/17/2020] pantoprazole  40 mg Oral Daily   sodium chloride flush  10-40 mL Intracatheter Q12H   sodium chloride flush  3 mL Intravenous Q12H   Continuous Infusions:  sodium chloride 250 mL (05/16/20 0801)   albumin human     cefTRIAXone (ROCEPHIN)  IV 2 g (05/14/20 1123)   cefUROXime (ZINACEF)  IV 1.5 g (05/16/20 0803)   clevidipine 9 mg/hr (05/16/20 0600)   dexmedetomidine (PRECEDEX) IV infusion Stopped (05/15/20 2104)   insulin 0.4 Units/hr (05/16/20 0504)   lactated ringers     lactated ringers Stopped (05/16/20 0516)   potassium chloride 10 mEq (05/16/20 0755)   PRN Meds:.albumin human, dextrose, metoprolol tartrate, morphine injection, ondansetron (ZOFRAN) IV, oxyCODONE, sodium chloride flush, sodium chloride flush, traMADol  General appearance: alert, cooperative and no distress Neurologic: intact Heart: regular rate and rhythm, no rub and no murmur Lungs: dim in bases Abdomen: soft, non tender Extremities: min edema  Lab Results: CBC: Recent Labs    05/15/20 2114 05/15/20 2200 05/16/20 0114 05/16/20 0403  WBC 12.3*  --   --  11.9*  HGB 12.3*   < > 11.6* 12.1*  HCT 36.8*   < > 34.0* 36.4*  PLT 183  --   --  177   < > =  values in this interval not displayed.   BMET:  Recent Labs    05/15/20 2114 05/15/20 2200 05/16/20 0114 05/16/20 0202  NA 146*   < > 147* 146*  K 2.9*   < > 3.0* 3.2*  CL 113*  --   --  112*  CO2 23  --   --  23  GLUCOSE 104*  --   --  95  BUN 7*  --   --  6*  CREATININE 0.64  --   --  0.71  CALCIUM 7.7*  --   --  7.6*   < > = values in this interval not displayed.    CMET: Lab Results  Component Value Date   WBC 11.9 (H) 05/16/2020   HGB 12.1 (L) 05/16/2020   HCT 36.4 (L) 05/16/2020   PLT 177 05/16/2020   GLUCOSE 95 05/16/2020   CHOL 154 07/26/2019   TRIG 113.0 07/26/2019   HDL 48.20 07/26/2019   LDLDIRECT 105.0 04/28/2016   LDLCALC 83 07/26/2019   ALT 30  05/14/2020   AST 28 05/14/2020   NA 146 (H) 05/16/2020   K 3.2 (L) 05/16/2020   CL 112 (H) 05/16/2020   CREATININE 0.71 05/16/2020   BUN 6 (L) 05/16/2020   CO2 23 05/16/2020   TSH 1.92 07/26/2019   PSA 94.69 (H) 05/18/2017   INR 1.6 (H) 05/15/2020   HGBA1C 6.8 (H) 05/02/2020   MICROALBUR <0.7 04/28/2016      PT/INR:  Recent Labs    05/15/20 1629  LABPROT 18.8*  INR 1.6*   Radiology: DG Chest Port 1 View  Result Date: 05/15/2020 CLINICAL DATA:  Postop aortic valve replacement today EXAM: PORTABLE CHEST 1 VIEW COMPARISON:  05/14/2020 FINDINGS: Endotracheal tube in good position. Mediastinal drain and bilateral chest tubes in good position. No pneumothorax. Port-A-Cath in the SVC. Right jugular Swan-Ganz catheter tip in the pulmonary outflow tract. Mild vascular congestion has progressed in the interval. Left lower lobe atelectasis and effusion have progressed. IMPRESSION: Postop changes above.  No pneumothorax Pulmonary vascular congestion Progressive left lower lobe atelectasis and small left effusion. Electronically Signed   By: Franchot Gallo M.D.   On: 05/15/2020 17:10    Results for orders placed or performed during the hospital encounter of 05/13/20  Blood Culture (routine x 2)     Status: None (Preliminary result)   Collection Time: 05/13/20 12:04 PM   Specimen: BLOOD  Result Value Ref Range Status   Specimen Description   Final    BLOOD RIGHT ANTECUBITAL Performed at [facility] [address] [place], [place] [address]    Special Requests   Final    BOTTLES DRAWN AEROBIC AND ANAEROBIC Blood Culture results may not be optimal due to an excessive volume of blood received in culture bottles Performed at [facility] [address], [place], [place] [address]    Culture  Setup Time   Final    GRAM POSITIVE COCCI IN CHAINS IN BOTH AEROBIC AND ANAEROBIC BOTTLES CRITICAL RESULT CALLED TO, READ BACK BY AND VERIFIED WITH: J.  LEDFORD,PHARMD 0602 05/15/2020 T. TYSOR    Culture   Final    NO GROWTH 2 DAYS Performed at [facility], [place] [address] [place], [place] [address]    Report Status PENDING  Incomplete  Blood Culture (routine x 2)     Status: None (Preliminary result)   Collection Time: 05/13/20 12:04 PM   Specimen: BLOOD  Result Value Ref  Range Status   Specimen Description   Final    BLOOD LEFT ANTECUBITAL Performed at [facility] [address] [place], [place] [address]    Special Requests   Final    BOTTLES DRAWN AEROBIC AND ANAEROBIC Blood Culture adequate volume Performed at [facility] [address] [place], [place] [address]    Culture  Setup Time   Final    GRAM POSITIVE COCCI IN CHAINS IN BOTH AEROBIC AND ANAEROBIC BOTTLES CRITICAL RESULT CALLED TO, READ BACK BY AND VERIFIED WITH: J. LEDFORD,PHARMD 0602 05/15/2020 Mena Goes Performed at [facility], [place] [address], [place], [place] [address]    Culture GRAM POSITIVE COCCI  Final   Report Status PENDING  Incomplete  SARS Coronavirus 2 by RT PCR (hospital order, performed in [facility] hospital lab) Nasopharyngeal Nasopharyngeal Swab     Status: None   Collection Time: 05/13/20 12:20 PM   Specimen: Nasopharyngeal Swab  Result Value Ref Range Status   SARS Coronavirus 2 NEGATIVE NEGATIVE Final    Comment: (NOTE) SARS-CoV-2 target nucleic acids are NOT DETECTED.  The SARS-CoV-2 RNA is generally detectable in upper and lower respiratory specimens during the acute phase of infection. The lowest concentration of SARS-CoV-2 viral copies this assay can detect is 250 copies / mL. A negative result does not preclude SARS-CoV-2 infection and should not be used as the sole basis for treatment or other patient management decisions.  A negative result may occur with improper specimen collection / handling, submission of specimen other than nasopharyngeal swab, presence of viral  mutation(s) within the areas targeted by this assay, and inadequate number of viral copies (<250 copies / mL). A negative result must be combined with clinical observations, patient history, and epidemiological information.  Fact Sheet for Patients:   StrictlyIdeas.no  Fact Sheet for Healthcare Providers: BankingDealers.co.za  This test is not yet approved or  cleared by the [place] FDA and has been authorized for detection and/or diagnosis of SARS-CoV-2 by FDA under an Emergency Use Authorization (EUA).  This EUA will remain in effect (meaning this test can be used) for the duration of the COVID-19 declaration under Section 564(b)(1) of the Act, 21 U.S.C. section 360bbb-3(b)(1), unless the authorization is terminated or revoked sooner.  Performed at [facility], [place] [address], [place], [place] [address]   Urine culture     Status: None   Collection Time: 05/13/20  1:10 PM   Specimen: In/Out Cath Urine  Result Value Ref Range Status   Specimen Description   Final    IN/OUT CATH URINE Performed at [facility] [address] [place], [place] [address]    Special Requests   Final    NONE Performed at [facility] [place] [address], [place], [place] [address]    Culture   Final    NO GROWTH Performed at [facility], [place] [address] [place],  [address]    Report Status 05/14/2020 FINAL  Final  MRSA PCR Screening     Status: None   Collection Time: 05/14/20  3:56 PM   Specimen: Nasal Mucosa; Nasopharyngeal  Result Value Ref Range Status   MRSA by PCR NEGATIVE NEGATIVE Final    Comment:        The GeneXpert MRSA Assay (FDA approved for NASAL specimens only), is one component of a comprehensive MRSA colonization surveillance program. It is not intended to diagnose MRSA infection nor to  guide or monitor treatment for MRSA infections. Performed at  [facility], [place] [address], [place], [place] [address]   Surgical pcr screen     Status: None   Collection Time: 05/14/20 10:33 PM   Specimen: Nasal Mucosa; Nasal Swab  Result Value Ref Range Status   MRSA, PCR NEGATIVE NEGATIVE Final   Staphylococcus aureus NEGATIVE NEGATIVE Final    Comment: (NOTE) The Xpert SA Assay (FDA approved for NASAL specimens in patients 14 years of age and older), is one component of a comprehensive surveillance program. It is not intended to diagnose infection nor to guide or monitor treatment. Performed at [facility], [place] [address], [place], [place] [address]   Aerobic/Anaerobic Culture (surgical/deep wound)     Status: None (Preliminary result)   Collection Time: 05/15/20 10:22 AM   Specimen: Tissue  Result Value Ref Range Status   Specimen Description TISSUE AORTIC VALVE VEGETATION  Final   Special Requests A  Final   Gram Stain   Final    FEW WBC PRESENT,BOTH PMN AND MONONUCLEAR FEW GRAM NEGATIVE RODS RARE GRAM POSITIVE COCCI IN PAIRS IN CLUSTERS Performed at [facility], [place] [address] [place], [place] [address]    Culture PENDING  Incomplete   Report Status PENDING  Incomplete   Assessment/Plan: S/P Procedure(s) (LRB): AORTIC VALVE REPLACEMENT (AVR) SIZE 23 MM,  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 (N/A) HUMAN ALLOGRAFT AORTIC ROOT REPLACEMENT (N/A) TRANSESOPHAGEAL ECHOCARDIOGRAM (TEE) (N/A)  POD#1 1 hemodyn stable on cleviprex, CI/CO, PAP in normal range, sganz to be removed 2 sinus rhythm,tachy 3 sats good on 8 l HFNC 4 CXR with small effus, some Atx 5 mod CT drainage 850 cc/24 h - keep tubes in place 6 expected volume overload- diurese with lasix, BUN/creat in normal range 7 minor expected ABL anemia, did receive mult products, packed cells intra op 8 very minor leukocytosis, cont rocephin/zinzacef tmax 99, gram stain with few gr neg rods , rare gram + cocci 9 K+ replacement  10 routine  progression/pulm toilet/ advance diet    Gaspar Bidding Pager [phone] 05/16/2020 8:04 AM    I have seen and examined the patient and agree with the assessment and plan as outlined.  Doing remarkably well POD1.  Mobilize.  D/C Swan-Ganz and Aline.  Start low dose beta blocker and wean Cleviprex off.  Start diuresis.  Continue ceftriaxone per I.D. team.  Ultimately will need dental extraction.  May need to consider removal of old Port-a-cath.   Rexene Alberts, MD 05/16/2020 12:53 PM

## 2020-05-16 NOTE — Procedures (Signed)
Extubation Procedure Note  Patient Details:   Name: Matthew Serrano. DOB: [birth year] MRN: [id number]   Airway Documentation:    Vent end date: (not recorded) Vent end time: (not recorded)   Evaluation  O2 sats: stable throughout Complications: No apparent complications Patient did tolerate procedure well. Bilateral Breath Sounds: Clear, Diminished   Yes  Pt VC 0.44ml, nif -30. Pt. On 4L [place] VS stable. No stridor noted. Pt. Able to say name. Bilateral breath sounds.  RT will continue to monitor. Gifford Shave 05/16/2020, 12:26 AM

## 2020-05-16 NOTE — Op Note (Signed)
INDICATIONS: native valve vegetation or leaflet perforation  PROCEDURE:   Informed consent was obtained prior to the procedure. The risks, benefits and alternatives for the procedure were discussed and the patient comprehended these risks.  Risks include, but are not limited to, cough, sore throat, vomiting, nausea, somnolence, esophageal and stomach trauma or perforation, bleeding, low blood pressure, aspiration, pneumonia, infection, trauma to the teeth and death.    After a procedural time-out, the oropharynx was anesthetized with 20% benzocaine spray.   During this procedure the patient was administered IV propofol by Anesthesiology  The transesophageal probe was inserted in the esophagus and stomach without difficulty and multiple views were obtained.  The patient was kept under observation until the patient left the procedure room.  The patient left the procedure room in stable condition.   Agitated microbubble saline contrast was not administered.  COMPLICATIONS:    There were no immediate complications.  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 There is improvement in the degree of aortic stenosis due to leaflet hypermobility. A small pericardial effusion without tamponade is present. Left ventricular systolic function is preserved and there is no evidence of endocarditis involvement of the other cardiac valves.  RECOMMENDATIONS:     CV surgery consultation has been requested.  Time Spent Directly with the Patient:  40 minutes   Noely Kuhnle 05/16/2020, 11:32 AM

## 2020-05-16 NOTE — Progress Notes (Signed)
CT surgery p.m. Rounds  Patient had a stable day mobilized out of bed Postop blood cultures pending prior to PICC line placement for long-term antibiotics  Blood pressure (!) 144/79, pulse 85, temperature 98.5 F (36.9 C), resp. rate (!) 25, height 5\' 7"  (1.702 m), weight 96.1 kg, SpO2 95 %.

## 2020-05-16 NOTE — Discharge Instructions (Signed)
Surgical Aortic Valve Replacement, Care After This sheet gives you information about how to care for yourself after your procedure. Your health care provider may also give you more specific instructions. If you have problems or questions, contact your health care provider. What can I expect after the procedure? After the procedure, it is common to have pain around your incision area. Follow these instructions at home: Medicines  Take over-the-counter and prescription medicines only as told by your health care provider.  If you were prescribed an antibiotic medicine, take it as told by your health care provider. Do not stop taking the antibiotic even if you start to feel better.  If you have a mechanical prosthesis, you may be given a blood thinner called warfarin. Follow instructions carefully on how to take this medicine.  Ask your health care provider if the medicine prescribed to you: ? Requires you to avoid driving or using heavy machinery. ? Can cause constipation. You may need to take actions to prevent or treat constipation, such as:  Take over-the-counter or prescription medicines.  Eat foods that are high in fiber, such as beans, whole grains, and fresh fruits and vegetables.  Limit foods that are high in fat and processed sugars, such as fried or sweet foods. Eating and drinking      Limit how much caffeine you drink. Caffeine can affect your heart's rate and rhythm.  Do not drink alcohol if: ? Your health care provider tells you not to drink. ? You are pregnant, may be pregnant, or are planning to become pregnant.  Drink enough fluid to keep your urine pale yellow.  Eat a heart-healthy diet that includes fruits, vegetables, whole grains, low-fat dairy products, and lean proteins like poultry and eggs. Incision care   Follow instructions from your health care provider about how to take care of your incision. Make sure you: ? Wash your hands with soap and water before  and after you change your bandage (dressing). If soap and water are not available, use hand sanitizer. ? Change your dressing as told by your health care provider. ? Leave stitches (sutures), skin glue, or adhesive strips in place. These skin closures may need to stay in place for 2 weeks or longer. If adhesive strip edges start to loosen and curl up, you may trim the loose edges. Do not remove adhesive strips completely unless your health care provider tells you to do that.  Check your incision area every day for signs of infection. Check for: ? Redness, swelling, or increasing pain. ? Fluid or blood. ? Warmth. ? Pus or a bad smell. Activity  Return to your normal activities as told by your health care provider. Most patients will need to limit any lifting or strenuous activity for 4-6 weeks.  Avoid sitting for a long time without moving. Get up to take short walks every 1-2 hours.  Do exercises as told by your health care provider.  Do not lift anything that is heavier than 10 lb (4.5 kg), or the limit that you are told, until your health care provider says that it is safe.  Avoid pushing or pulling things with your arms until your health care provider approves. This includes pulling on handrails to help you climb stairs. Lifestyle  Do not use any products that contain nicotine or tobacco, such as cigarettes, e-cigarettes, and chewing tobacco. These can delay incision healing after surgery. If you need help quitting, ask your health care provider.  Resume sexual activity as told   by your health care provider. If you have erectile dysfunction, do not use medicines to treat this condition unless your health care provider approves.  Work with your health care provider to: ? Keep your blood pressure and cholesterol under control. ? Manage any other heart conditions that you have. ? Maintain a healthy weight. Driving and travel  Do not drive until your health care provider approves. Ask  your health care provider when it is safe for you to drive.  Avoid airplane travel for as long as told by your health care provider.  When you travel, bring a list of your medicines and a record of your medical history with you. Carry your medicines with you. General instructions  Do not take baths, swim, or use a hot tub until your health care provider approves. You may shower  Do not strain to have a bowel movement.  Avoid crossing your legs while sitting down.  Check your temperature every day for a fever. A fever may be a sign of infection.  If you are a woman and you plan to become pregnant, talk with your health care provider before you become pregnant.  Wear compression stockings as told by your health care provider. These stockings help to prevent blood clots and reduce swelling in your legs.  Tell all health care providers who care for you that you have an artificial (prosthetic) aortic valve. Also, tell them if you have or have had heart disease or endocarditis.  You will be given a card at discharge. The card indicates the type of prosthetic valve that you have. Keep this card in your wallet or purse for quick reference in case of an emergency.  Keep all follow-up visits as told by your health care provider. This is important. Contact a health care provider if:  You develop a skin rash.  You experience sudden, unexplained changes in your weight.  You have redness, swelling, or increasing pain around your incision.  You have fluid or blood coming from your incision.  Your incision feels warm to the touch.  You have pus or a bad smell coming from your incision.  You have a fever. Get help right away if you:  Develop chest pain that is different from the pain coming from your incision.  Develop shortness of breath or difficulty breathing.  Start to feel light-headed. These symptoms may represent a serious problem that is an emergency. Do not wait to see if the  symptoms will go away. Get medical help right away. Call your local emergency services (911 in the [place] Do not drive yourself to the hospital. Summary  After this procedure, it is common to have pain in the incision area.  Eat a heart-healthy diet. Follow instructions about alcohol use.  Get up to walk often. Avoid pushing and pulling with your arms. Ask what activities are safe for you.  Care for your incision as told by your health care provider. Check it daily for signs of infection.  Get help right away if you have chest pain that is different from your incisions, develop shortness of breath or difficulty breathing, or start to feel light-headed. This information is not intended to replace advice given to you by your health care provider. Make sure you discuss any questions you have with your health care provider. Document Revised: 05/04/2018 Document Reviewed: 05/04/2018 Elsevier Patient Education  2020 [employer]  

## 2020-05-16 NOTE — Discharge Summary (Signed)
Physician Discharge Summary  Patient ID: Matthew Serrano. MRN: [id number] DOB/AGE: 1946/05/27 74 y.o.  Admit date: 05/13/2020 Discharge date: 05/23/2020  Admission Diagnoses: Aortic valve endocarditis with root abscess  Discharge Diagnoses:  Principal Problem:   S/P aortic root replacement with human allograft Active Problems:   Hypothyroidism   Diabetes mellitus ([place])   Hyperlipidemia   Essential hypertension   Anemia of chronic disease   Aortic stenosis   Malignant neoplasm of prostate (HCC)   Streptococcal bacteremia   Acute CHF (congestive heart failure) (HCC)   Respiratory distress   Aortic valve endocarditis   Severe aortic insufficiency   History of GI bleed   Normocytic anemia   Abscess of aortic valve   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 Streptococcal endocarditis 05/21/2020  . 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  . 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 History of GI bleed 05/14/2020  . Normocytic anemia 05/14/2020  . Endocarditis 05/14/2020  . Abscess of aortic valve 05/14/2020  . Fistula between aorta and pulmonary artery ([place]) 05/14/2020  . Streptococcal bacteremia 05/13/2020  . Acute CHF (congestive heart failure) ([place]) 05/13/2020  . Respiratory distress 05/13/2020  . Aortic valve endocarditis 05/13/2020  . Severe aortic insufficiency 05/13/2020  . Acute blood loss anemia 05/02/2020  . Malignant neoplasm of prostate ([place]) 06/23/2018  . Goals of care, counseling/discussion 06/23/2018  . Aortic stenosis   . Left inguinal hernia 09/04/2015  . Alcoholism ([place]) 06/10/2014  . Anemia of chronic disease 05/23/2014  . Elevated PSA 01/20/2012  . HEMORRHAGE OF RECTUM AND ANUS 12/15/2008  . ERECTILE DYSFUNCTION, MILD 12/14/2007  . SEBORRHEIC KERATOSIS, INFLAMED 08/03/2007  . NEOPLASM, SKIN, UNCERTAIN  BEHAVIOR 65/78/4696  . Hypothyroidism 07/06/2007  . Diabetes mellitus ([place]) 07/06/2007  . Hyperlipidemia 07/06/2007  . Essential hypertension 07/06/2007   HPI: at time of consultation  Patient is a 74 year old moderately obese African-American male with history of severe symptomatic aortic stenosis, nonobstructive coronary artery disease, hypertension, hyperlipidemia, type 2 diabetes mellitus, metastatic prostate cancer on suppression chemotherapy, and recent GI bleed who was admitted to [facility] yesterday with febrile illness and positive blood cultures and 2D echocardiogram revealing findings consistent with bacterial endocarditis with severe aortic insufficiency.  Transesophageal echocardiogram was performed this afternoon by Dr. 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  Urgent cardiac surgical consultation was requested.  Patient has history of aortic stenosis which progressed in severity on follow-up transthoracic echocardiogram.  Transthoracic echocardiogram performed February 05, 2020 revealed normal left ventricular systolic function with severe aortic stenosis with peak velocity across the aortic valve measured Greater than 4.0 m/s corresponding to mean transvalvular gradient estimated 44 mmHg and aortic valve area calculated 0.88 cm by VTI.  There was no aortic insufficiency at that time.  He was referred to the multidisciplinary heart valve clinic and initially evaluated by Dr. Burt Knack on February 08, 2020.  Diagnostic cardiac catheterization revealed widely patent coronary arteries with mild nonobstructive coronary artery disease.  Right heart pressures were normal.  CT angiography was performed and the patient was referred for surgical consultation.  He was  evaluated by Dr. Cyndia Bent on February 27, 2020 and felt to be relatively good candidate for transcatheter aortic valve replacement as an alternative to conventional surgery.  The patient was noted to have poor dentition and was seen by an oral surgeon with plans to undergo dental extraction.  Patient was hospitalized 2 weeks ago with low-grade fever, symptomatic anemia, melanotic stools, and generalized fatigue.  Hemoglobin was 6.3 on admission down from 7.31-week earlier.  He was transfused packed red blood cells and underwent both EGD and colonoscopy.  EGD was unremarkable and colonoscopy revealed diverticulosis with nonbleeding cecal telangiectasias which were treated with APC.  Follow-up urine culture and blood cultures performed as an outpatient grew multiple organisms including both E. coli and Klebsiella from urine culture with blood cultures reportedly positive for Streptococcus species.  The patient was readmitted to [facility] yesterday where echocardiogram revealed the new finding of severe aortic insufficiency with suspicion for possible vegetation on both the left and right cusps of the aortic valve.  Left ventricular function remain normal.  The patient was started on broad-spectrum antibiotics and transferred to [facility] where he underwent transesophageal echocardiogram this afternoon.  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  Cardiothoracic surgical consultation was requested.  Patient is interviewed awake and alert in the endoscopy suite recovery area.  He has been a widower and lives alone locally in [place] by himself.  He has a brother and sister who live locally nearby as well as a stepson and a close friend.  He states that up until recently he has remained physically  active and functionally independent.  He notes that over the past several months he has gone progressively downhill.  He states that over the last 6 to 8 weeks he has developed worsening exertional shortness of breath with dramatically worsening fatigue and decreased energy.  He has had poor appetite and low-grade fevers for the last several weeks.  He has no difficulty swallowing.  He reports bowel function has been regular and he has not had any hematochezia or melena over the last 2 weeks.  Over the past 24 to 48 hours he states his breathing has suddenly gotten considerably worse and he is now short of breath at rest although he can lay flat comfortably in bed.  He denies any chest pain or chest tightness.  He is not having any abdominal pain.  He has no dysuria.  He has chronic lower extremity edema.  He has still not undergone dental extraction.  He denies any headaches or transient visual disturbances.  He has not had any other neurologic symptoms   Dr. Roxy Manns evaluated the patient and all relevant studies and recommended proceeding with surgical intervention.  Discharged Condition: good  Hospital Course: Following full medical evaluation and stabilization it was felt the patient could proceed with surgical intervention and on 05/15/2020 he was taken to the operating room where he underwent the below described procedure.  He did require multiple units of packed cells as well as FFP/platelets/cryoprecipitate.  He tolerated it well and was taken to the surgical intensive care unit in stable condition.  Postoperative hospital course:  Patient was extubated, neurologically intact following routine post cardiac surgical protocols without difficulty.  On postop day #1 he was hemodynamically stable on low-dose Cleviprex for heart rate.  Chest tube drainage was moderate and tubes were kept  in place.  He did evidence some mild volume overload and was started on a course of gentle diuretics.  Swan-Ganz and  arterial line were removed on postoperative day #1.  X-ray showed small effusions with some atelectasis and he did require oxygen at 8 L high flow nasal cannula but this was able to be weaned over time.  Initial Gram stain did show few gram-negative rods with rare gram-positive cocci and he was kept on Rocephin and Zinacef on postoperative day #1 with plans to follow-up cultures over time.  Cultures did reveal strep anginosis which is sensitive to Rocephin which will be continued through November 4.  He has remained hemodynamically stable.  He has remained in sinus rhythm.  All routine lines monitors and drainage devices have been discontinued in the standard fashion.  Oxygen has been weaned and he maintains good saturations on room air.  He does have a very mild expected acute blood loss anemia at this point which is stable.  Renal function is within normal limits.  He is not significantly volume overloaded.  He has required some diuretics.  Blood sugars have been under adequate control using standard measures.  It is felt that he will require skilled nursing facility for continuation of antibiotics as he has no significant help at home at this time.  Social work is assisting with finding a facility.  Currently he is afebrile and incisions are healing well.  A PICC line has been placed.  He is felt to be stable for discharge to facility when bed becomes available.  Consults: cardiology and ID  Significant Diagnostic Studies:TTE AND TEE, routine post op labs/serial cxr's  Treatments: surgery:  CARDIOTHORACIC SURGERY OPERATIVE NOTE  Date of Procedure:                05/15/2020  Preoperative Diagnosis:        Streptococcus anginosus Bacterial Endocarditis  Severe Aortic Insufficiency  Aortic Root Abscess  Aorta to Pulmonary Artery Fistula  Postoperative Diagnosis:    Same   Procedure:        Human Allograft Aortic Root Replacement             [employer] human aortic graft (size 23 mm, ID #  [id number])             Reimplantation of left and right coronary arteries             Repair of aorta to pulmonary artery fistula              Surgeon:        Valentina Gu. 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 rate 16, height '5\' 7"'  (1.702 m), weight 100.2 kg, SpO2 97 %. General appearance: alert, cooperative and no distress Heart: regular rate and rhythm Lungs: mildly dim in bases Abdomen: benign Extremities: minor edema Wound: incis healing well  Disposition:  Discharge disposition: 03-Skilled Nursing Facility       Discharge Instructions    AMB Referral to Cardiac Rehabilitation - Phase II  Complete by: As directed    Diagnosis: Valve Replacement   Valve: Aortic   [facility] to provide Cath Flo 76m   Complete by: As directed    Administer for PICC line occlusion and as ordered by physician for other access device issues.   Anaphylaxis Kit: Provided to treat any anaphylactic reaction to the medication being provided to the patient if First Dose or when requested by physician   Complete by: As directed    Epinephrine 120mml vial / amp: Administer 0.68m38m0.68ml67mubcutaneously once for moderate to severe anaphylaxis, nurse to call physician and pharmacy when reaction occurs and call 911 if needed for immediate care   Diphenhydramine 50mg38mIV vial: Administer 25-50mg 64mM PRN for first dose reaction, rash, itching, mild reaction, nurse to call physician and pharmacy when reaction occurs   Sodium Chloride 0.9% NS 500ml I268mAdminister if needed for hypovolemic blood pressure drop or as ordered by physician after call to physician with anaphylactic reaction   Change dressing on IV access line weekly and PRN   Complete by: As directed    Discharge patient   Complete by: As directed    [place] date: 05/23/2020   Flush IV access with Sodium Chloride 0.9% and Heparin 10 units/ml or 100 units/ml   Complete by: As directed    Home infusion instructions - [facility]   Complete by: As directed    Instructions: Flush IV access with Sodium Chloride 0.9% and Heparin 10units/ml or 100units/ml   Change dressing on IV access line: Weekly and PRN   Instructions Cath Flo 2mg: Ad77mister for PICC Line occlusion and as ordered by physician for other access device   [facility] pharmacist to adjust dose for: Vancomycin, Aminoglycosides and other anti-infective therapies as requested by physician   Method of administration may be changed at the discretion of home infusion pharmacist based upon assessment of the patient and/or caregiver's ability to self-administer the medication ordered   Complete by: As directed    Outpatient Parenteral Antibiotic Therapy Information Antibiotic: Ceftriaxone (Rocephin) IVPB; Indications for use: endocarditis; End Date: 07/06/2020   Complete by: As directed    Antibiotic: Ceftriaxone (Rocephin) IVPB   Indications for use: endocarditis   End Date: 07/06/2020     Allergies as of 05/23/2020   No Known Allergies     Medication List    STOP taking these medications   cloNIDine 0.2 MG tablet Commonly known as: CATAPRES   fosinopril 40 MG tablet Commonly known as: MONOPRIL   labetalol 200 MG tablet Commonly known as: NORMODYNE   lidocaine-prilocaine cream Commonly known as: EMLA   NIFEdipine 60 MG 24 hr tablet Commonly known as: PROCARDIA XL/NIFEDICAL XL   pioglitazone 15 MG tablet Commonly known as:  ACTOS   sulfamethoxazole-trimethoprim 800-160 MG tablet Commonly known as: BACTRIM DS     TAKE these medications   abiraterone acetate 250 MG tablet Commonly known as: ZYTIGA TAKE 4 TABLETS (1,000 MG TOTAL) BY MOUTH DAILY. TAKE ON AN EMPTY STOMACH 1 HOUR BEFORE OR 2 HOURS AFTER A MEAL   aspirin 325 MG EC tablet One tab po daily What changed:   medication strength  how much to take  how to take this  when to take this  additional instructions   atorvastatin 40 MG tablet Commonly known as: LIPITOR TAKE 1 TABLET EVERY DAY   carvedilol 25 MG tablet Commonly known as: COREG Take 1 tablet (  25 mg total) by mouth 2 (two) times daily with a meal.   cefTRIAXone  IVPB Commonly known as: ROCEPHIN Inject 2 g into the vein daily. Indication:  endocarditis First Dose: Yes Last Day of Therapy:  06/26/2020 Labs - Once weekly:  CBC/D and BMP, Labs - Every other week:  ESR and CRP Method of administration: IV Push Method of administration may be changed at the discretion of home infusion pharmacist based upon assessment of the patient and/or caregiver's ability to self-administer the medication ordered.   cefTRIAXone 2 g in sodium chloride 0.9 % 100 mL Inject 2 g into the vein daily.   cholecalciferol 25 MCG (1000 UNIT) tablet Commonly known as: VITAMIN D3 Take 1,000 Units by mouth daily.   doxazosin 8 MG tablet Commonly known as: CARDURA Take 1 tablet (8 mg total) by mouth at bedtime.   ferrous sulfate 325 (65 FE) MG EC tablet Take 1 tablet (325 mg total) by mouth 2 (two) times daily before a meal.   furosemide 20 MG tablet Commonly known as: LASIX Take 1 tablet (20 mg total) by mouth 2 (two) times daily. What changed:   medication strength  how much to take  when to take this  Another medication with the same name was removed. Continue taking this medication, and follow the directions you see here.   glipiZIDE 10 MG 24 hr tablet Commonly known as: GLUCOTROL  XL TAKE 1 TABLET TWICE DAILY What changed: when to take this   levothyroxine 150 MCG tablet Commonly known as: SYNTHROID TAKE 1 TABLET EVERY DAY What changed: when to take this   metFORMIN 1000 MG tablet Commonly known as: GLUCOPHAGE TAKE 1 TABLET TWICE DAILY WITH MEALS   omeprazole 40 MG capsule Commonly known as: PRILOSEC TAKE 1 CAPSULE BY MOUTH EVERY DAY What changed:   how much to take  additional instructions   oxyCODONE 5 MG immediate release tablet Commonly known as: Oxy IR/ROXICODONE Take 1 tablet (5 mg total) by mouth every 6 (six) hours as needed for severe pain.   potassium chloride 10 MEQ tablet Commonly known as: KLOR-CON Take 2 tablets (20 mEq total) by mouth 2 (two) times daily. TAKE 1 TABLET TWICE DAILY What changed: medication strength   predniSONE 5 MG tablet Commonly known as: DELTASONE TAKE 1 TABLET (5 MG TOTAL) BY MOUTH DAILY WITH BREAKFAST.            Discharge Care Instructions  (From admission, onward)         Start     Ordered   05/23/20 0000  Change dressing on IV access line weekly and PRN  (Home infusion instructions - [facility] )        05/23/20 0729          Follow-up Information    Rexene Alberts, MD Follow up.   Specialty: Cardiothoracic Surgery Why: Please see discharge paperwork for follow-up appointments with surgeon.  On the day you see surgeon also obtain a chest x-ray at [facility] 1/2-hour prior to appointment.  It is located in the same office complex on the first floor. Contact information: [address] [place]  [address] [phone]        [name], MD Follow up on 06/13/2020.   Specialty: Cardiology Why: at 11:20 Contact information: [address] [address] [phone]        [facility] Follow up on 06/24/2020.   Specialty: Cardiology  Why: Follow up echocardiogram at 10:30am  Contact  information: [address] [place]       Michel Bickers, MD Follow up.   Specialty: Infectious Diseases Why: 10/28 at 9:30am. If you are not able to make this appointment please call to reschedule.  Contact information: [address] [place] [address] [phone]              ID recs:Rocephin 2 grams IV daily Duration: 6 weeks End Date: 06/26/20  [facility] Care Per Protocol:  Home health RN for IV administration and teaching; PICC line care and labs.   Labs weekly while on IV antibiotics: _X_ CBC with differential __ BMP _X_ CMP _X_ CRP _X_ ESR __ Vancomycin trough __ CK  _X_ Please pull PIC at completion of IV antibiotics __ Please leave PIC in place until doctor has seen patient or been notified  Fax weekly labs to [phone]  Clinic Follow Up Appt:  10/28 at 9:30am with Dr. Megan Salon  1. Please obtain vital signs at least one time daily 2.Please weigh the patient daily. If he or she continues to gain weight or develops lower extremity edema, contact the office at [phone]. 3. Ambulate patient at least three times daily and please use sternal precautions. Signed: John Giovanni 05/23/2020, 7:35 AM

## 2020-05-16 NOTE — Addendum Note (Signed)
Addendum  created 05/16/20 1505 by Josephine Igo, CRNA   Order list changed

## 2020-05-17 ENCOUNTER — Inpatient Hospital Stay (HOSPITAL_COMMUNITY): Payer: Medicare Other

## 2020-05-17 DIAGNOSIS — C61 Malignant neoplasm of prostate: Secondary | ICD-10-CM

## 2020-05-17 DIAGNOSIS — I10 Essential (primary) hypertension: Secondary | ICD-10-CM

## 2020-05-17 DIAGNOSIS — E0800 Diabetes mellitus due to underlying condition with hyperosmolarity without nonketotic hyperglycemic-hyperosmolar coma (NKHHC): Secondary | ICD-10-CM

## 2020-05-17 LAB — CBC
HCT: 37.4 % — ABNORMAL LOW (ref 39.0–52.0)
Hemoglobin: 12 g/dL — ABNORMAL LOW (ref 13.0–17.0)
MCH: 27.8 pg (ref 26.0–34.0)
MCHC: 32.1 g/dL (ref 30.0–36.0)
MCV: 86.6 fL (ref 80.0–100.0)
Platelets: 196 K/uL (ref 150–400)
RBC: 4.32 MIL/uL (ref 4.22–5.81)
RDW: 16.9 % — ABNORMAL HIGH (ref 11.5–15.5)
WBC: 11.2 K/uL — ABNORMAL HIGH (ref 4.0–10.5)
nRBC: 0 % (ref 0.0–0.2)

## 2020-05-17 LAB — BASIC METABOLIC PANEL
Anion gap: 9 (ref 5–15)
BUN: 5 mg/dL — ABNORMAL LOW (ref 8–23)
CO2: 25 mmol/L (ref 22–32)
Calcium: 7.4 mg/dL — ABNORMAL LOW (ref 8.9–10.3)
Chloride: 109 mmol/L (ref 98–111)
Creatinine, Ser: 0.9 mg/dL (ref 0.61–1.24)
GFR calc Af Amer: >60 mL/min (ref >60)
GFR calc non Af Amer: >60 mL/min (ref >60)
Glucose, Bld: 101 mg/dL — ABNORMAL HIGH (ref 70–99)
Potassium: 2.8 mmol/L — ABNORMAL LOW (ref 3.5–5.1)
Sodium: 143 mmol/L (ref 135–145)

## 2020-05-17 LAB — GLUCOSE, CAPILLARY
Glucose-Capillary: 99 mg/dL (ref 70–99)
Glucose-Capillary: 101 mg/dL — ABNORMAL HIGH (ref 70–99)
Glucose-Capillary: 114 mg/dL — ABNORMAL HIGH (ref 70–99)
Glucose-Capillary: 133 mg/dL — ABNORMAL HIGH (ref 70–99)
Glucose-Capillary: 94 mg/dL (ref 70–99)
Glucose-Capillary: 99 mg/dL (ref 70–99)

## 2020-05-17 LAB — CULTURE, BLOOD (ROUTINE X 2): Special Requests: ADEQUATE

## 2020-05-17 MED ORDER — DOXAZOSIN MESYLATE 2 MG PO TABS
8.0000 mg | ORAL_TABLET | Freq: Every day | ORAL | Status: DC
  Administered 2020-05-17 – 2020-05-22 (×6): 8 mg via ORAL
  Filled 2020-05-17 (×2): qty 1
  Filled 2020-05-17 (×5): qty 4

## 2020-05-17 MED ORDER — INSULIN ASPART 100 UNIT/ML ~~LOC~~ SOLN
0.0000 [IU] | Freq: Three times a day (TID) | SUBCUTANEOUS | Status: DC
  Administered 2020-05-17 – 2020-05-18 (×2): 2 [IU] via SUBCUTANEOUS

## 2020-05-17 MED ORDER — POTASSIUM CHLORIDE 10 MEQ/50ML IV SOLN
10.0000 meq | INTRAVENOUS | Status: AC
  Administered 2020-05-17 (×3): 10 meq via INTRAVENOUS
  Filled 2020-05-17 (×3): qty 50

## 2020-05-17 MED ORDER — INSULIN ASPART 100 UNIT/ML ~~LOC~~ SOLN: 0.0000 [IU] | Freq: Every day | SUBCUTANEOUS | Status: DC

## 2020-05-17 MED ORDER — POTASSIUM CHLORIDE CRYS ER 20 MEQ PO TBCR
20.0000 meq | EXTENDED_RELEASE_TABLET | ORAL | Status: AC
  Administered 2020-05-17 (×3): 20 meq via ORAL
  Filled 2020-05-17 (×3): qty 1

## 2020-05-17 MED ORDER — FUROSEMIDE 10 MG/ML IJ SOLN
40.0000 mg | Freq: Every day | INTRAMUSCULAR | Status: DC
  Administered 2020-05-18: 40 mg via INTRAVENOUS
  Filled 2020-05-17: qty 4

## 2020-05-17 MED ORDER — SODIUM CHLORIDE 0.9 % IV SOLN
INTRAVENOUS | Status: DC | PRN
  Administered 2020-05-17: 250 mL via INTRAVENOUS
  Administered 2020-05-17: 500 mL via INTRAVENOUS

## 2020-05-17 NOTE — Progress Notes (Signed)
2 Days Post-Op Procedure(s) (LRB): AORTIC VALVE REPLACEMENT (AVR) SIZE 23 MM,  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 (N/A) HUMAN ALLOGRAFT AORTIC ROOT REPLACEMENT (N/A) TRANSESOPHAGEAL ECHOCARDIOGRAM (TEE) (N/A) Subjective: OOB to chair, nsr diuresing well Postop blood cultures pending Chest  tubes 650 yesterday   Objective: Vital signs in last 24 hours: Temp:  [97.7 F (36.5 C)-98.8 F (37.1 C)] 98.2 F (36.8 C) (09/25 0655) Pulse Rate:  [79-90] 85 (09/25 0800) Cardiac Rhythm: Normal sinus rhythm (09/25 0800) Resp:  [12-28] 19 (09/25 0800) BP: (98-166)/(75-101) 129/84 (09/25 0800) SpO2:  [95 %-100 %] 97 % (09/25 0800) Weight:  [99.7 kg] 99.7 kg (09/25 0700)  Hemodynamic parameters for last 24 hours:    Intake/Output from previous day: 09/24 0701 - 09/25 0700 In: 902.9 [P.O.:360; I.V.:26.4; IV Piggyback:516.5] Out: 2025 [Urine:1385; Chest Tube:640] Intake/Output this shift: Total I/O In: -  Out: 50 [Urine:50]  sternal dressing dry  Lab Results: Recent Labs    05/16/20 1542 05/17/20 0451  WBC 14.4* 11.2*  HGB 12.1* 12.0*  HCT 37.3* 37.4*  PLT 192 196   BMET:  Recent Labs    05/16/20 1542 05/17/20 0451  NA 143 143  K 4.1 2.8*  CL 108 109  CO2 23 25  GLUCOSE 139* 101*  BUN 6* 5*  CREATININE 0.95 0.90  CALCIUM 7.6* 7.4*    PT/INR:  Recent Labs    05/15/20 1629  LABPROT 18.8*  INR 1.6*   ABG    Component Value Date/Time   PHART 7.431 05/16/2020 0457   HCO3 23.2 05/16/2020 0457   TCO2 24 05/16/2020 0457   ACIDBASEDEF 1.0 05/16/2020 0457   O2SAT 92.0 05/16/2020 0457   CBG (last 3)  Recent Labs    05/16/20 2343 05/17/20 0439 05/17/20 0656  GLUCAP 117* 99 101*    Assessment/Plan: S/P Procedure(s) (LRB): AORTIC VALVE REPLACEMENT (AVR) SIZE 23 MM,  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 (N/A) HUMAN ALLOGRAFT AORTIC ROOT REPLACEMENT (N/A) TRANSESOPHAGEAL ECHOCARDIOGRAM (TEE) (N/A) See progression orders Leave chest  tubes PICC placement when blood cultures negative  LOS: 4 days    Tharon Aquas Trigt III 05/17/2020

## 2020-05-17 NOTE — Progress Notes (Signed)
Progress Note  Patient Name: Matthew Serrano. Date of Encounter: 05/17/2020  [facility] Cardiologist: [name], MD   Subjective   Seems to be doing remarkably well. Took a few steps. No complaints. Generalized edema. Probably 5-10 lb above usual weight.  Inpatient Medications    Scheduled Meds: . acetaminophen  1,000 mg Oral Q6H  . aspirin EC  325 mg Oral Daily  . bisacodyl  10 mg Oral Daily   Or  . bisacodyl  10 mg Rectal Daily  . Chlorhexidine Gluconate Cloth  6 each Topical Daily  . docusate sodium  200 mg Oral Daily  . enoxaparin (LOVENOX) injection  30 mg Subcutaneous QHS  . furosemide  40 mg Intravenous BID  . insulin aspart  0-24 Units Subcutaneous Q4H  . mouth rinse  15 mL Mouth Rinse BID  . metoprolol tartrate  12.5 mg Oral BID  . pantoprazole  40 mg Oral Daily  . potassium chloride  20 mEq Oral Q4H  . sodium chloride flush  10-40 mL Intracatheter Q12H  . sodium chloride flush  3 mL Intravenous Q12H   Continuous Infusions: . sodium chloride Stopped (05/16/20 0950)  . cefTRIAXone (ROCEPHIN)  IV Stopped (05/16/20 1221)  . clevidipine Stopped (05/16/20 0854)  . dexmedetomidine (PRECEDEX) IV infusion Stopped (05/15/20 2104)  . insulin Stopped (05/16/20 1400)  . lactated ringers    . lactated ringers Stopped (05/16/20 0516)   PRN Meds: dextrose, metoprolol tartrate, morphine injection, ondansetron (ZOFRAN) IV, oxyCODONE, sodium chloride flush, sodium chloride flush, traMADol   Vital Signs    Vitals:   05/17/20 0500 05/17/20 0600 05/17/20 0655 05/17/20 0700  BP: (!) 149/98 (!) 148/82  140/89  Pulse: 83 81  86  Resp: (!) 26 16  (!) 25  Temp:   98.2 F (36.8 C)   TempSrc:   Oral   SpO2: 95% 97%  98%  Weight:    99.7 kg  Height:        Intake/Output Summary (Last 24 hours) at 05/17/2020 0811 Last data filed at 05/17/2020 0600 Gross per 24 hour  Intake 902.88 ml  Output 2025 ml  Net -1122.12 ml   Last 3 Weights 05/17/2020 05/16/2020  05/15/2020  Weight (lbs) 219 lb 12.8 oz 211 lb 13.8 oz 206 lb 2.1 oz  Weight (kg) 99.7 kg 96.1 kg 93.5 kg      Telemetry    One 6-beat NSVT, no Afib - Personally Reviewed  ECG    NSR with normal PR interval, inferior Q waves- Personally Reviewed  Physical Exam  Appears comfortable up in chair. Chest tube x 2 and R IJ CVC GEN: No acute distress.   Neck: No JVD Cardiac: RRR, no murmurs, rubs, or gallops. Sternotomy appears healthy Respiratory: diminished in bases, but othw clear to auscultation bilaterally. GI: Soft, nontender, non-distended  MS: 2+  Edema upper and lower extr; No deformity. Neuro:  Nonfocal  Psych: Normal affect   Labs    High Sensitivity Troponin:  No results for input(s): TROPONINIHS in the last 720 hours.    Chemistry Recent Labs  Lab 05/11/20 0114 05/11/20 0114 05/13/20 1204 05/14/20 0505 05/14/20 2226 05/15/20 0157 05/16/20 1220 05/16/20 1542 05/17/20 0451  NA 137   < > 133*   < > 140   < > 144 143 143  K 3.5   < > 4.7   < > 3.4*   < > 3.6 4.1 2.8*  CL 109   < > 103   < >  109   < > 110 108 109  CO2 18*   < > 16*   < > 19*   < > 24 23 25   GLUCOSE 100*   < > 96   < > 99   < > 125* 139* 101*  BUN 10   < > 18   < > 10   < > 6* 6* 5*  CREATININE 0.75   < > 1.05   < > 0.93   < > 0.92 0.95 0.90  CALCIUM 8.3*   < > 8.4*   < > 8.0*   < > 7.5* 7.6* 7.4*  PROT 5.7*  --  5.7*  --  5.1*  --   --   --   --   ALBUMIN 2.5*  --  2.3*  --  1.9*  --   --   --   --   AST 34  --  45*  --  28  --   --   --   --   ALT 40  --  37  --  30  --   --   --   --   ALKPHOS 71  --  84  --  78  --   --   --   --   BILITOT 0.8  --  0.7  --  1.0  --   --   --   --   GFRNONAA >60   < > >60   < > >60   < > >60 >60 >60  GFRAA >60   < > >60   < > >60   < > >60 >60 >60  ANIONGAP 10   < > 14   < > 12   < > 10 12 9    < > = values in this interval not displayed.     Hematology Recent Labs  Lab 05/16/20 1220 05/16/20 1542 05/17/20 0451  WBC 13.2* 14.4* 11.2*  RBC 4.38 4.30  4.32  HGB 12.5* 12.1* 12.0*  HCT 37.6* 37.3* 37.4*  MCV 85.8 86.7 86.6  MCH 28.5 28.1 27.8  MCHC 33.2 32.4 32.1  RDW 16.4* 16.6* 16.9*  PLT 194 192 196    BNP Recent Labs  Lab 05/13/20 1204  BNP 484.0*     DDimer No results for input(s): DDIMER in the last 168 hours.   Radiology    DG Chest Port 1 View  Result Date: 05/16/2020 CLINICAL DATA:  Post open heart surgery EXAM: PORTABLE CHEST 1 VIEW COMPARISON:  05/15/2020 FINDINGS: Stable mediastinal contours.  Accentuated by portable technique. Postextubation with slightly diminished lung volumes. LEFT chest tube and RIGHT paramidline chest support tube in similar position as compared to the previous study. RIGHT IJ Port-A-Cath and RIGHT IJ Swan-Ganz catheter also unchanged. No sign of pneumothorax. Slight increased opacification along the minor fissure in the RIGHT chest. Linear opacities increased at the LEFT lung base since the prior study as well. Visualized skeletal structures without acute process. IMPRESSION: 1. Postextubation chest x-ray with slightly diminished lung volumes and increasing opacification at the LEFT lung base in particular, likely atelectasis. 2. No sign of pneumothorax. 3. Support lines and tubes otherwise unchanged. Electronically Signed   By: Zetta Bills M.D.   On: 05/16/2020 08:15   DG Chest Port 1 View  Result Date: 05/15/2020 CLINICAL DATA:  Postop aortic valve replacement today EXAM: PORTABLE CHEST 1 VIEW COMPARISON:  05/14/2020 FINDINGS: Endotracheal tube in good position. Mediastinal drain and bilateral chest  tubes in good position. No pneumothorax. Port-A-Cath in the SVC. Right jugular Swan-Ganz catheter tip in the pulmonary outflow tract. Mild vascular congestion has progressed in the interval. Left lower lobe atelectasis and effusion have progressed. IMPRESSION: Postop changes above.  No pneumothorax Pulmonary vascular congestion Progressive left lower lobe atelectasis and small left effusion.  Electronically Signed   By: Franchot Gallo M.D.   On: 05/15/2020 17:10    Cardiac Studies   TEE 05/14/2020   1. Left ventricular ejection fraction, by estimation, is 70 to 75%. The left ventricle has hyperdynamic function. The left ventricle has no regional wall motion abnormalities. There is mild concentric left  ventricular hypertrophy.  2. Right ventricular systolic function is hyperdynamic. The right ventricular size is normal.  3. Left atrial size was mildly dilated. No left atrial/left atrial  appendage thrombus was detected.  4. A small pericardial effusion is present. The pericardial effusion is circumferential. There is no evidence of cardiac tamponade.  5. The mitral valve is normal in structure. Trivial mitral valve regurgitation.  6. There is endocarditis of the aortic valve with a torn/flail right coronary cusp and a periannular abscess. The right coronary cusp, which was previously severely limited in mobility, now has broad excursions in systole and diastole, leading to a reduced severity of the aortic stenosis. The aortic insufficiency jet is highly eccentric, directed posteriorly and parallel with the plane of the  annulus. The aortic valve is tricuspid. There is moderate calcification of the aortic valve. There is severe thickening of the aortic valve. Aortic valve regurgitation is severe. Mild to moderate aortic valve stenosis.  7. There is a periannular aortic abscess, extending around the area of the left coronary artery and its bifurcation. There is a fistula connecting the aortic root at the level of the right coronary sinus to the  supraannular pulmonary artery with a large continuous left to right shunt.. There is mild (Grade II) plaque involving the descending aorta and ascending aorta.   Patient Profile     74 y.o. 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 Additional medical issues include metastatic prostate Ca, DM, HTN, moderate nonobstructive CAD and recent GI bleed due to cecal angiodysplasia  Assessment & Plan    1. Strep endocarditis: pan sensitive organism, on ceftriaxone. Gram stain of valve specimen was positive for Gram +ve cocci. No fever, minimal elevation in WBC. Unfortunately at risk for reinfection of homograft due to need for emergent surgery before blood cultures were cleared. Will need PICC once central line DC'd 2.  S/P AVR/root repair  Homograft day # 2: making good progress, not on pressors, onfact BP is high. No Afib and no AV block. 3. HTN: on IV clevidipine and oral metoprolol. He was on 5 antiHTN agents preop, including clonidine. 4. DM: aggressive glucose control postop. 5. Nonobstructive CAD: no angina. On statin. 6. Prostate Ca     For questions or updates, please contact [place] Please consult [email or web address] for contact info under        Signed, Sanda Klein, MD  05/17/2020, 8:11 AM

## 2020-05-17 NOTE — Progress Notes (Signed)
CT surgery Patient resting comfortably Stable day Blood pressure 117/79, pulse 91, temperature 98.9 F (37.2 C), resp. rate 14, height 5\' 7"  (1.702 m), weight 99.7 kg, SpO2 97 %.

## 2020-05-18 ENCOUNTER — Inpatient Hospital Stay (HOSPITAL_COMMUNITY): Payer: Medicare Other

## 2020-05-18 ENCOUNTER — Inpatient Hospital Stay: Payer: Self-pay

## 2020-05-18 LAB — BPAM RBC
Blood Product Expiration Date: 202110152359
Blood Product Expiration Date: 202110152359
Blood Product Expiration Date: 202110152359
Blood Product Expiration Date: 202110152359
Blood Product Expiration Date: 202110152359
Blood Product Expiration Date: 202110152359
Blood Product Expiration Date: 202110152359
Blood Product Expiration Date: 202110162359
Blood Product Expiration Date: 202110202359
Blood Product Expiration Date: 202110202359
Blood Product Expiration Date: 202110202359
Blood Product Expiration Date: 202110202359
ISSUE DATE / TIME: 202109230732
ISSUE DATE / TIME: 202109230732
ISSUE DATE / TIME: 202109230732
ISSUE DATE / TIME: 202109230732
ISSUE DATE / TIME: 202109231004
ISSUE DATE / TIME: 202109231004
ISSUE DATE / TIME: 202109231004
ISSUE DATE / TIME: 202109231004
Unit Type and Rh: 6200
Unit Type and Rh: 6200
Unit Type and Rh: 6200
Unit Type and Rh: 6200
Unit Type and Rh: 6200
Unit Type and Rh: 6200
Unit Type and Rh: 6200
Unit Type and Rh: 6200
Unit Type and Rh: 6200
Unit Type and Rh: 6200
Unit Type and Rh: 6200
Unit Type and Rh: 6200

## 2020-05-18 LAB — TYPE AND SCREEN
ABO/RH(D): A POS
Antibody Screen: NEGATIVE
Unit division: 0
Unit division: 0
Unit division: 0
Unit division: 0
Unit division: 0
Unit division: 0
Unit division: 0
Unit division: 0
Unit division: 0
Unit division: 0
Unit division: 0
Unit division: 0

## 2020-05-18 LAB — BASIC METABOLIC PANEL
Anion gap: 9 (ref 5–15)
BUN: 7 mg/dL — ABNORMAL LOW (ref 8–23)
CO2: 24 mmol/L (ref 22–32)
Calcium: 7 mg/dL — ABNORMAL LOW (ref 8.9–10.3)
Chloride: 108 mmol/L (ref 98–111)
Creatinine, Ser: 0.84 mg/dL (ref 0.61–1.24)
GFR calc Af Amer: >60 mL/min (ref >60)
GFR calc non Af Amer: >60 mL/min (ref >60)
Glucose, Bld: 101 mg/dL — ABNORMAL HIGH (ref 70–99)
Potassium: 3.5 mmol/L (ref 3.5–5.1)
Sodium: 141 mmol/L (ref 135–145)

## 2020-05-18 LAB — GLUCOSE, CAPILLARY
Glucose-Capillary: 101 mg/dL — ABNORMAL HIGH (ref 70–99)
Glucose-Capillary: 90 mg/dL (ref 70–99)
Glucose-Capillary: 134 mg/dL — ABNORMAL HIGH (ref 70–99)
Glucose-Capillary: 104 mg/dL — ABNORMAL HIGH (ref 70–99)
Glucose-Capillary: 101 mg/dL — ABNORMAL HIGH (ref 70–99)

## 2020-05-18 LAB — CBC
HCT: 34.1 % — ABNORMAL LOW (ref 39.0–52.0)
Hemoglobin: 11 g/dL — ABNORMAL LOW (ref 13.0–17.0)
MCH: 27.8 pg (ref 26.0–34.0)
MCHC: 32.3 g/dL (ref 30.0–36.0)
MCV: 86.3 fL (ref 80.0–100.0)
Platelets: 210 10*3/uL (ref 150–400)
RBC: 3.95 MIL/uL — ABNORMAL LOW (ref 4.22–5.81)
RDW: 17.2 % — ABNORMAL HIGH (ref 11.5–15.5)
WBC: 11 10*3/uL — ABNORMAL HIGH (ref 4.0–10.5)
nRBC: 0 % (ref 0.0–0.2)

## 2020-05-18 MED ORDER — CHLORHEXIDINE GLUCONATE 0.12 % MT SOLN
15.0000 mL | Freq: Two times a day (BID) | OROMUCOSAL | Status: DC
  Administered 2020-05-18 – 2020-05-22 (×10): 15 mL via OROMUCOSAL
  Filled 2020-05-18 (×10): qty 15

## 2020-05-18 MED ORDER — POTASSIUM CHLORIDE CRYS ER 20 MEQ PO TBCR
20.0000 meq | EXTENDED_RELEASE_TABLET | ORAL | Status: AC
  Administered 2020-05-18 (×3): 20 meq via ORAL
  Filled 2020-05-18 (×3): qty 1

## 2020-05-18 MED ORDER — CARVEDILOL 6.25 MG PO TABS
6.2500 mg | ORAL_TABLET | Freq: Two times a day (BID) | ORAL | Status: DC
  Administered 2020-05-18 (×2): 6.25 mg via ORAL
  Filled 2020-05-18 (×2): qty 1

## 2020-05-18 NOTE — Progress Notes (Signed)
Progress Note  Patient Name: Matthew Serrano. Date of Encounter: 05/18/2020  [facility] Cardiologist: [name], MD   Subjective   Afebrile, good night's rest, no complaints.  Inpatient Medications    Scheduled Meds: . acetaminophen  1,000 mg Oral Q6H  . aspirin EC  325 mg Oral Daily  . bisacodyl  10 mg Oral Daily   Or  . bisacodyl  10 mg Rectal Daily  . carvedilol  6.25 mg Oral BID WC  . Chlorhexidine Gluconate Cloth  6 each Topical Daily  . docusate sodium  200 mg Oral Daily  . doxazosin  8 mg Oral QHS  . enoxaparin (LOVENOX) injection  30 mg Subcutaneous QHS  . furosemide  40 mg Intravenous Daily  . insulin aspart  0-15 Units Subcutaneous TID WC  . insulin aspart  0-5 Units Subcutaneous QHS  . mouth rinse  15 mL Mouth Rinse BID  . pantoprazole  40 mg Oral Daily  . sodium chloride flush  10-40 mL Intracatheter Q12H  . sodium chloride flush  3 mL Intravenous Q12H   Continuous Infusions: . sodium chloride Stopped (05/16/20 0950)  . sodium chloride 10 mL/hr at 05/17/20 1200  . cefTRIAXone (ROCEPHIN)  IV Stopped (05/17/20 1156)  . lactated ringers     PRN Meds: sodium chloride, dextrose, metoprolol tartrate, ondansetron (ZOFRAN) IV, oxyCODONE, sodium chloride flush, sodium chloride flush, traMADol   Vital Signs    Vitals:   05/17/20 1927 05/17/20 2329 05/18/20 0500 05/18/20 0726  BP:      Pulse:      Resp:      Temp: 98.9 F (37.2 C) 98.2 F (36.8 C)  98.8 F (37.1 C)  TempSrc: Oral Oral    SpO2:      Weight:   102.1 kg   Height:        Intake/Output Summary (Last 24 hours) at 05/18/2020 0825 Last data filed at 05/18/2020 0500 Gross per 24 hour  Intake 421.24 ml  Output 1665 ml  Net -1243.76 ml   Last 3 Weights 05/18/2020 05/17/2020 05/16/2020  Weight (lbs) 225 lb 1.4 oz 219 lb 12.8 oz 211 lb 13.8 oz  Weight (kg) 102.1 kg 99.7 kg 96.1 kg      Telemetry    NSR/mild sinus tachy, occasional isolated PVCs - Personally Reviewed  ECG    No  new tracing - Personally Reviewed  Physical Exam  Appears comfortable GEN: No acute distress.   Neck: No JVD Cardiac: RRR, no murmurs, rubs, or gallops.  Respiratory: Clear to auscultation bilaterally. Sternotomy looks healthy GI: Soft, nontender, non-distended  MS: No edema; No deformity. Neuro:  Nonfocal  Psych: Normal affect   Labs    High Sensitivity Troponin:  No results for input(s): TROPONINIHS in the last 720 hours.    Chemistry Recent Labs  Lab 05/13/20 1204 05/14/20 0505 05/14/20 2226 05/15/20 0157 05/16/20 1542 05/17/20 0451 05/18/20 0350  NA 133*   < > 140   < > 143 143 141  K 4.7   < > 3.4*   < > 4.1 2.8* 3.5  CL 103   < > 109   < > 108 109 108  CO2 16*   < > 19*   < > 23 25 24   GLUCOSE 96   < > 99   < > 139* 101* 101*  BUN 18   < > 10   < > 6* 5* 7*  CREATININE 1.05   < > 0.93   < >  0.95 0.90 0.84  CALCIUM 8.4*   < > 8.0*   < > 7.6* 7.4* 7.0*  PROT 5.7*  --  5.1*  --   --   --   --   ALBUMIN 2.3*  --  1.9*  --   --   --   --   AST 45*  --  28  --   --   --   --   ALT 37  --  30  --   --   --   --   ALKPHOS 84  --  78  --   --   --   --   BILITOT 0.7  --  1.0  --   --   --   --   GFRNONAA >60   < > >60   < > >60 >60 >60  GFRAA >60   < > >60   < > >60 >60 >60  ANIONGAP 14   < > 12   < > 12 9 9    < > = values in this interval not displayed.     Hematology Recent Labs  Lab 05/16/20 1542 05/17/20 0451 05/18/20 0350  WBC 14.4* 11.2* 11.0*  RBC 4.30 4.32 3.95*  HGB 12.1* 12.0* 11.0*  HCT 37.3* 37.4* 34.1*  MCV 86.7 86.6 86.3  MCH 28.1 27.8 27.8  MCHC 32.4 32.1 32.3  RDW 16.6* 16.9* 17.2*  PLT 192 196 210    BNP Recent Labs  Lab 05/13/20 1204  BNP 484.0*     DDimer No results for input(s): DDIMER in the last 168 hours.   Radiology    DG Chest Port 1 View  Result Date: 05/17/2020 CLINICAL DATA:  Atelectasis EXAM: PORTABLE CHEST 1 VIEW COMPARISON:  Chest radiograph May 16, 2020 FINDINGS: Right anterior chest wall Port-A-Cath is  present with tip projecting over the superior vena cava. Right IJ sheath projects over the superior vena cava. Redemonstrated mediastinal drain and bilateral chest tubes. Similar-appearing by by lateral mid and lower lung airspace opacities. No pleural effusion or pneumothorax. IMPRESSION: Similar-appearing bilateral mid and lower lung airspace opacities. Support apparatus as above. Electronically Signed   By: Lovey Newcomer M.D.   On: 05/17/2020 09:49    Cardiac Studies   TEE 05/14/2020   1. Left ventricular ejection fraction, by estimation, is 70 to 75%. The left ventricle has hyperdynamic function. The left ventricle has no regional wall motion abnormalities. There is mild concentric left  ventricular hypertrophy.  2. Right ventricular systolic function is hyperdynamic. The right ventricular size is normal.  3. Left atrial size was mildly dilated. No left atrial/left atrial  appendage thrombus was detected.  4. A small pericardial effusion is present. The pericardial effusion is circumferential. There is no evidence of cardiac tamponade.  5. The mitral valve is normal in structure. Trivial mitral valve regurgitation.  6. There is endocarditis of the aortic valve with a torn/flail right coronary cusp and a periannular abscess. The right coronary cusp, which was previously severely limited in mobility, now has broad excursions in systole and diastole, leading to a reduced severity of the aortic stenosis. The aortic insufficiency jet is highly eccentric, directed posteriorly and parallel with the plane of the  annulus. The aortic valve is tricuspid. There is moderate calcification of the aortic valve. There is severe thickening of the aortic valve. Aortic valve regurgitation is severe. Mild to moderate aortic valve stenosis.  7. There is a periannular aortic abscess, extending around the area  of the left coronary artery and its bifurcation. There is a fistula connecting the aortic root at the  level of the right coronary sinus to the  supraannular pulmonary artery with a large continuous left to right shunt.. There is mild (Grade II) plaque involving the descending aorta and ascending aorta.    Patient Profile     74 y.o. male 74 y.o. 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 Additional medical issues include metastatic prostate Ca, DM, HTN, moderate nonobstructive CAD and recent GI bleed due to cecal angiodysplasia  Assessment & Plan    1. Strep endocarditis: pan sensitive organism, on ceftriaxone. Gram stain of valve specimen was positive for Gram +ve cocci. No fever, minimal elevation in WBC persists. Unfortunately at risk for reinfection of homograft due to need for emergent surgery before blood cultures were cleared. Will need PICC once central line DC'd (after cultures are sterile). 2.  S/P AVR/root repair  Homograft day # 2: making good progress, no Afib and no AV block. 3. HTN: Off IV meds. DBP getting higher. He was on 5 antiHTN agents preop. Will switch from metoprolol to carvedilol (he was on clonidine and labetalol preop and will benefit from combined alpha-beta blockade).  4. DM: aggressive glucose control postop. 5. Nonobstructive CAD: no angina. On statin. 6. Prostate Ca     For questions or updates, please contact [place] Please consult [email or web address] for contact info under        Signed, Sanda Klein, MD  05/18/2020, 8:25 AM

## 2020-05-18 NOTE — Progress Notes (Signed)
Pt transferred to 4E19 via wheelchair on telemetry monitoring. Pt's VSS and tolerated transfer well. Pt received by NT.

## 2020-05-18 NOTE — Progress Notes (Signed)
3 Days Post-Op Procedure(s) (LRB): AORTIC VALVE REPLACEMENT (AVR) SIZE 23 MM,  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 (N/A) HUMAN ALLOGRAFT AORTIC ROOT REPLACEMENT (N/A) TRANSESOPHAGEAL ECHOCARDIOGRAM (TEE) (N/A) Subjective: Doing well chest drains out maintaining sinus rhythm Ambulating in hallway IV team will not place PICC line until Port-A-Cath removed We will remove Cordis and transfer to progressive care  Objective: Vital signs in last 24 hours: Temp:  [98.2 F (36.8 C)-98.9 F (37.2 C)] 98.3 F (36.8 C) (09/26 1133) Pulse Rate:  [90-109] 103 (09/26 1300) Cardiac Rhythm: Normal sinus rhythm (09/26 1200) Resp:  [13-24] 24 (09/26 1300) BP: (107-162)/(69-102) 132/87 (09/26 1300) SpO2:  [96 %-98 %] 97 % (09/26 1300) Weight:  [102.1 kg] 102.1 kg (09/26 0500)  Hemodynamic parameters for last 24 hours:  Stable  Intake/Output from previous day: 09/25 0701 - 09/26 0700 In: 421.2 [P.O.:280; I.V.:0.8; IV Piggyback:140.4] Out: 0076 [Urine:1475; Chest Tube:240] Intake/Output this shift: Total I/O In: 31.9 [I.V.:0.6; IV Piggyback:31.3] Out: 1250 [Urine:1200; Chest Tube:50]  Exam \\Neuro  intact Lungs clear No murmur Sternal dressing dry Minimal edema  Lab Results: Recent Labs    05/17/20 0451 05/18/20 0350  WBC 11.2* 11.0*  HGB 12.0* 11.0*  HCT 37.4* 34.1*  PLT 196 210   BMET:  Recent Labs    05/17/20 0451 05/18/20 0350  NA 143 141  K 2.8* 3.5  CL 109 108  CO2 25 24  GLUCOSE 101* 101*  BUN 5* 7*  CREATININE 0.90 0.84  CALCIUM 7.4* 7.0*    PT/INR:  Recent Labs    05/15/20 1629  LABPROT 18.8*  INR 1.6*   ABG    Component Value Date/Time   PHART 7.431 05/16/2020 0457   HCO3 23.2 05/16/2020 0457   TCO2 24 05/16/2020 0457   ACIDBASEDEF 1.0 05/16/2020 0457   O2SAT 92.0 05/16/2020 0457   CBG (last 3)  Recent Labs    05/18/20 0348 05/18/20 0724 05/18/20 1129  GLUCAP 101* 90 134*    Assessment/Plan: S/P Procedure(s) (LRB): AORTIC  VALVE REPLACEMENT (AVR) SIZE 23 MM,  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 (N/A) HUMAN ALLOGRAFT AORTIC ROOT REPLACEMENT (N/A) TRANSESOPHAGEAL ECHOCARDIOGRAM (TEE) (N/A) Continue IV Rocephin Postop blood cultures remain negative PICC line placement after Port-A-Cath removed  LOS: 5 days    Tharon Aquas Trigt III 05/18/2020

## 2020-05-18 NOTE — Progress Notes (Signed)
Received PICC order for long term antibiotics.  Pt noted to have a RCW PAC.  Per EBP, Recommendations made to access [facility] and cancel PICC order via secure chat.  No reply from cardiology at this time.  Dr Levonne Spiller ID contacted and agrees with said recommendation to decrease risk of infection. Please d/c PICC order and request PAC when necessary access per ID recommendations. Thank you for the consult.

## 2020-05-19 ENCOUNTER — Inpatient Hospital Stay (HOSPITAL_COMMUNITY): Payer: Medicare Other

## 2020-05-19 LAB — BASIC METABOLIC PANEL
Anion gap: 8 (ref 5–15)
BUN: 8 mg/dL (ref 8–23)
CO2: 25 mmol/L (ref 22–32)
Calcium: 7.1 mg/dL — ABNORMAL LOW (ref 8.9–10.3)
Chloride: 110 mmol/L (ref 98–111)
Creatinine, Ser: 0.88 mg/dL (ref 0.61–1.24)
GFR calc Af Amer: >60 mL/min (ref >60)
GFR calc non Af Amer: >60 mL/min (ref >60)
Glucose, Bld: 99 mg/dL (ref 70–99)
Potassium: 3.1 mmol/L — ABNORMAL LOW (ref 3.5–5.1)
Sodium: 143 mmol/L (ref 135–145)

## 2020-05-19 LAB — CBC
HCT: 36.2 % — ABNORMAL LOW (ref 39.0–52.0)
Hemoglobin: 11.9 g/dL — ABNORMAL LOW (ref 13.0–17.0)
MCH: 28.8 pg (ref 26.0–34.0)
MCHC: 32.9 g/dL (ref 30.0–36.0)
MCV: 87.7 fL (ref 80.0–100.0)
Platelets: 260 10*3/uL (ref 150–400)
RBC: 4.13 MIL/uL — ABNORMAL LOW (ref 4.22–5.81)
RDW: 17.2 % — ABNORMAL HIGH (ref 11.5–15.5)
WBC: 11.5 10*3/uL — ABNORMAL HIGH (ref 4.0–10.5)
nRBC: 0 % (ref 0.0–0.2)

## 2020-05-19 LAB — GLUCOSE, CAPILLARY
Glucose-Capillary: 94 mg/dL (ref 70–99)
Glucose-Capillary: 159 mg/dL — ABNORMAL HIGH (ref 70–99)

## 2020-05-19 MED ORDER — ~~LOC~~ CARDIAC SURGERY, PATIENT & FAMILY EDUCATION: Freq: Once | Status: DC

## 2020-05-19 MED ORDER — FUROSEMIDE 40 MG PO TABS
40.0000 mg | ORAL_TABLET | Freq: Every day | ORAL | Status: DC
  Administered 2020-05-19 – 2020-05-21 (×3): 40 mg via ORAL
  Filled 2020-05-19 (×3): qty 1

## 2020-05-19 MED ORDER — POTASSIUM CHLORIDE CRYS ER 20 MEQ PO TBCR
40.0000 meq | EXTENDED_RELEASE_TABLET | Freq: Three times a day (TID) | ORAL | Status: AC
  Administered 2020-05-19 – 2020-05-20 (×5): 40 meq via ORAL
  Filled 2020-05-19 (×5): qty 2

## 2020-05-19 MED ORDER — CARVEDILOL 12.5 MG PO TABS
12.5000 mg | ORAL_TABLET | Freq: Two times a day (BID) | ORAL | Status: DC
  Administered 2020-05-19 (×2): 12.5 mg via ORAL
  Filled 2020-05-19 (×3): qty 1

## 2020-05-19 NOTE — Progress Notes (Addendum)
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 (N/A) HUMAN ALLOGRAFT AORTIC ROOT REPLACEMENT (N/A) TRANSESOPHAGEAL ECHOCARDIOGRAM (TEE) (N/A) Subjective: Looks and feels well Some sternal incisional pain  Objective: Vital signs in last 24 hours: Temp:  [98.2 F (36.8 C)-99 F (37.2 C)] 98.2 F (36.8 C) (09/27 0329) Pulse Rate:  [97-110] 110 (09/27 0329) Cardiac Rhythm: Normal sinus rhythm (09/26 1900) Resp:  [13-24] 20 (09/27 0329) BP: (104-162)/(69-102) 122/83 (09/27 0329) SpO2:  [97 %-99 %] 98 % (09/27 0329) Weight:  [98.2 kg] 98.2 kg (09/27 0329)  Hemodynamic parameters for last 24 hours:    Intake/Output from previous day: 09/26 0701 - 09/27 0700 In: 107 [I.V.:7.2; IV Piggyback:99.9] Out: 1750 [Urine:1700; Chest Tube:50] Intake/Output this shift: No intake/output data recorded.  General appearance: alert, cooperative and no distress Heart: regular rate and rhythm and tachy Lungs: mildly dim in bases Abdomen: benign Extremities: + LE edema Wound: incis healing well  Lab Results: Recent Labs    05/18/20 0350 05/19/20 0318  WBC 11.0* 11.5*  HGB 11.0* 11.9*  HCT 34.1* 36.2*  PLT 210 260   BMET:  Recent Labs    05/18/20 0350 05/19/20 0318  NA 141 143  K 3.5 3.1*  CL 108 110  CO2 24 25  GLUCOSE 101* 99  BUN 7* 8  CREATININE 0.84 0.88  CALCIUM 7.0* 7.1*    PT/INR: No results for input(s): LABPROT, INR in the last 72 hours. ABG    Component Value Date/Time   PHART 7.431 05/16/2020 0457   HCO3 23.2 05/16/2020 0457   TCO2 24 05/16/2020 0457   ACIDBASEDEF 1.0 05/16/2020 0457   O2SAT 92.0 05/16/2020 0457   CBG (last 3)  Recent Labs    05/18/20 1512 05/18/20 2055 05/19/20 0559  GLUCAP 104* 101* 94    Meds Scheduled Meds: . acetaminophen  1,000 mg Oral Q6H  . aspirin  EC  325 mg Oral Daily  . bisacodyl  10 mg Oral Daily   Or  . bisacodyl  10 mg Rectal Daily  . carvedilol  12.5 mg Oral BID WC  . chlorhexidine  15 mL Mouth/Throat BID  . Chlorhexidine Gluconate Cloth  6 each Topical Daily  . [place] Cardiac Surgery, Patient & Family Education   Does not apply Once  . docusate sodium  200 mg Oral Daily  . doxazosin  8 mg Oral QHS  . enoxaparin (LOVENOX) injection  30 mg Subcutaneous QHS  . mouth rinse  15 mL Mouth Rinse BID  . pantoprazole  40 mg Oral Daily  . potassium chloride  40 mEq Oral TID WC  . sodium chloride flush  10-40 mL Intracatheter Q12H  . sodium chloride flush  3 mL Intravenous Q12H   Continuous Infusions: . sodium chloride Stopped (05/16/20 0950)  . sodium chloride 10 mL/hr at 05/18/20 1400  . cefTRIAXone (ROCEPHIN)  IV Stopped (05/18/20 1320)  . lactated ringers     PRN Meds:.sodium chloride, dextrose, metoprolol tartrate, ondansetron (ZOFRAN) IV, oxyCODONE, sodium chloride flush, sodium chloride flush, traMADol  Xrays DG Chest Port 1 View  Result Date: 05/19/2020 CLINICAL DATA:  Sore chest.  AVR. EXAM: PORTABLE CHEST 1 VIEW COMPARISON:  05/18/2020. FINDINGS: Interval removal of right IJ sheath,  mediastinal drainage catheter, and bilateral chest tubes. PowerPort catheter stable position. Stable cardiomegaly unchanged bibasilar atelectasis. Small left pleural effusion cannot be excluded. No pneumothorax. IMPRESSION: 1. Interval removal of right IJ sheath, mediastinal drainage catheter, and bilateral chest tubes. No pneumothorax. 2. Stable cardiomegaly and bibasilar atelectasis. Electronically Signed   By: Marcello Moores  Register   On: 05/19/2020 06:44   DG Chest Port 1 View  Result Date: 05/18/2020 CLINICAL DATA:  Postop from aortic valve replacement. EXAM: PORTABLE CHEST 1 VIEW COMPARISON:  05/17/2020 FINDINGS: Right-sided power port, or right jugular Cordis, bilateral pleural catheters, and mediastinal drains remain in place. No  pneumothorax visualized. Mild bibasilar atelectasis shows no significant change. Heart size remains stable. IMPRESSION: Stable mild bibasilar atelectasis. No pneumothorax visualized. Electronically Signed   By: Marlaine Hind M.D.   On: 05/18/2020 09:17   [place] EKG SITE RITE  Result Date: 05/18/2020 If Site Rite image not attached, placement could not be confirmed due to current cardiac rhythm.  Results for orders placed or performed during the hospital encounter of 05/13/20  Blood Culture (routine x 2)     Status: Abnormal   Collection Time: 05/13/20 12:04 PM   Specimen: BLOOD  Result Value Ref Range Status   Specimen Description   Final    BLOOD RIGHT ANTECUBITAL Performed at [facility] [address], [place], [place] [address]    Special Requests   Final    BOTTLES DRAWN AEROBIC AND ANAEROBIC Blood Culture results may not be optimal due to an excessive volume of blood received in culture bottles Performed at [facility] [address], [place], [place] [address]    Culture  Setup Time   Final    GRAM POSITIVE COCCI IN CHAINS IN BOTH AEROBIC AND ANAEROBIC BOTTLES CRITICAL RESULT CALLED TO, READ BACK BY AND VERIFIED WITH: J. LEDFORD,PHARMD 0602 05/15/2020 T. TYSOR    Culture (A)  Final    STREPTOCOCCUS ANGINOSIS SUSCEPTIBILITIES PERFORMED ON PREVIOUS CULTURE WITHIN THE LAST 5 DAYS. Performed at [facility], [place] [address] [place], [place] [address]    Report Status 05/17/2020 FINAL  Final  Blood Culture (routine x 2)     Status: Abnormal   Collection Time: 05/13/20 12:04 PM   Specimen: BLOOD  Result Value Ref Range Status   Specimen Description   Final    BLOOD LEFT ANTECUBITAL Performed at [facility] [address], [place], [place] [address]    Special Requests   Final    BOTTLES DRAWN AEROBIC AND ANAEROBIC Blood Culture adequate volume Performed at [facility] [address]  [place], [place] [address]    Culture  Setup Time   Final    GRAM POSITIVE COCCI IN CHAINS IN BOTH AEROBIC AND ANAEROBIC BOTTLES CRITICAL RESULT CALLED TO, READ BACK BY AND VERIFIED WITH: J. LEDFORD,PHARMD 0602 05/15/2020 T. TYSOR    Culture (A)  Final    STREPTOCOCCUS ANGINOSIS SUSCEPTIBILITIES PERFORMED ON PREVIOUS CULTURE WITHIN THE LAST 5 DAYS. Performed at [facility], [place] [address] [place],  [address]    Report Status 05/17/2020 FINAL  Final  SARS Coronavirus 2 by RT PCR (hospital order, performed in [facility] hospital lab) Nasopharyngeal Nasopharyngeal Swab     Status: None   Collection Time: 05/13/20 12:20 PM   Specimen: Nasopharyngeal Swab  Result Value Ref Range Status   SARS Coronavirus 2 NEGATIVE NEGATIVE Final    Comment: (NOTE) SARS-CoV-2 target nucleic acids are  NOT DETECTED.  The SARS-CoV-2 RNA is generally detectable in upper and lower respiratory specimens during the acute phase of infection. The lowest concentration of SARS-CoV-2 viral copies this assay can detect is 250 copies / mL. A negative result does not preclude SARS-CoV-2 infection and should not be used as the sole basis for treatment or other patient management decisions.  A negative result may occur with improper specimen collection / handling, submission of specimen other than nasopharyngeal swab, presence of viral mutation(s) within the areas targeted by this assay, and inadequate number of viral copies (<250 copies / mL). A negative result must be combined with clinical observations, patient history, and epidemiological information.  Fact Sheet for Patients:   StrictlyIdeas.no  Fact Sheet for Healthcare Providers: BankingDealers.co.za  This test is not yet approved or  cleared by the [place] FDA and has been authorized for detection and/or diagnosis of SARS-CoV-2 by FDA under an Emergency Use Authorization (EUA).  This EUA will  remain in effect (meaning this test can be used) for the duration of the COVID-19 declaration under Section 564(b)(1) of the Act, 21 U.S.C. section 360bbb-3(b)(1), unless the authorization is terminated or revoked sooner.  Performed at [facility], [place] [address], [place], [place] [address]   Urine culture     Status: None   Collection Time: 05/13/20  1:10 PM   Specimen: In/Out Cath Urine  Result Value Ref Range Status   Specimen Description   Final    IN/OUT CATH URINE Performed at [facility] [address] [place], [place] [address]    Special Requests   Final    NONE Performed at [facility], [place] [address], [place], [place] [address]    Culture   Final    NO GROWTH Performed at [facility], [place] [address], [place], [place] [address]    Report Status 05/14/2020 FINAL  Final  MRSA PCR Screening     Status: None   Collection Time: 05/14/20  3:56 PM   Specimen: Nasal Mucosa; Nasopharyngeal  Result Value Ref Range Status   MRSA by PCR NEGATIVE NEGATIVE Final    Comment:        The GeneXpert MRSA Assay (FDA approved for NASAL specimens only), is one component of a comprehensive MRSA colonization surveillance program. It is not intended to diagnose MRSA infection nor to guide or monitor treatment for MRSA infections. Performed at [facility], [place] [address], [place], [place] [address]   Surgical pcr screen     Status: None   Collection Time: 05/14/20 10:33 PM   Specimen: Nasal Mucosa; Nasal Swab  Result Value Ref Range Status   MRSA, PCR NEGATIVE NEGATIVE Final   Staphylococcus aureus NEGATIVE NEGATIVE Final    Comment: (NOTE) The Xpert SA Assay (FDA approved for NASAL specimens in patients 82 years of age and older), is one component of a comprehensive surveillance program. It is not intended to diagnose infection nor to guide or monitor treatment. Performed at [facility],  [place] [address] [place], [place] [address]   Aerobic/Anaerobic Culture (surgical/deep wound)     Status: None (Preliminary result)   Collection Time: 05/15/20 10:22 AM   Specimen: Tissue  Result Value Ref Range Status   Specimen Description TISSUE AORTIC VALVE VEGETATION  Final   Special Requests A  Final   Gram Stain   Final    FEW WBC PRESENT,BOTH PMN AND MONONUCLEAR FEW GRAM NEGATIVE RODS  RARE GRAM POSITIVE COCCI IN PAIRS IN CLUSTERS    Culture   Final    FEW STREPTOCOCCUS ANGINOSIS SUSCEPTIBILITIES TO FOLLOW Performed at [facility], [place] [address], [place], [place] [address]    Report Status PENDING  Incomplete  Culture, blood (routine x 2)     Status: None (Preliminary result)   Collection Time: 05/16/20 12:14 PM   Specimen: BLOOD  Result Value Ref Range Status   Specimen Description BLOOD LEFT ANTECUBITAL  Final   Special Requests   Final    BOTTLES DRAWN AEROBIC AND ANAEROBIC Blood Culture results may not be optimal due to an inadequate volume of blood received in culture bottles   Culture   Final    NO GROWTH 3 DAYS Performed at [facility], [place] [address] [place], [place] [address]    Report Status PENDING  Incomplete  Culture, blood (routine x 2)     Status: None (Preliminary result)   Collection Time: 05/16/20 12:14 PM   Specimen: BLOOD  Result Value Ref Range Status   Specimen Description BLOOD LEFT ANTECUBITAL  Final   Special Requests   Final    BOTTLES DRAWN AEROBIC ONLY Blood Culture results may not be optimal due to an inadequate volume of blood received in culture bottles   Culture   Final    NO GROWTH 3 DAYS Performed at [facility], [place] [address], [place], [place] [address]    Report Status PENDING  Incomplete  Culture, blood (Routine X 2) w Reflex to ID Panel     Status: None (Preliminary result)   Collection Time: 05/17/20  6:00 AM   Specimen: BLOOD  Result Value Ref Range Status   Specimen Description BLOOD LEFT ANTECUBITAL   Final   Special Requests   Final    BOTTLES DRAWN AEROBIC AND ANAEROBIC Blood Culture adequate volume   Culture   Final    NO GROWTH 2 DAYS Performed at [facility], [place] [address] [place], [place] [address]    Report Status PENDING  Incomplete  Culture, blood (Routine X 2) w Reflex to ID Panel     Status: None (Preliminary result)   Collection Time: 05/17/20  6:04 AM   Specimen: BLOOD LEFT HAND  Result Value Ref Range Status   Specimen Description BLOOD LEFT HAND  Final   Special Requests   Final    BOTTLES DRAWN AEROBIC AND ANAEROBIC Blood Culture adequate volume   Culture   Final    NO GROWTH 2 DAYS Performed at [facility], [place] [address] [place],  [address]    Report Status PENDING  Incomplete    Assessment/Plan: S/P Procedure(s) (LRB): AORTIC VALVE REPLACEMENT (AVR) SIZE 23 MM,  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 Use portacath for IV abx at discharge 5 H/H is stable 6 normal renal fxn, good diuresis, replace K+, could consider adding some short term lasix 7 BS well controlled 8 CXR stable bilat atx    LOS: 6 days    John Giovanni PA-C Pager [phone] 05/19/2020   I have seen and examined the patient and agree with the  assessment and plan as outlined.  Old Port-a-cath needs to be removed.  Will insert PICC line after Port is out.  Rexene Alberts, MD 05/19/2020 8:52 AM

## 2020-05-19 NOTE — Progress Notes (Signed)
Radiology called regarding orthopantagram, the machine is down and they do not have a time when it will be fixed. Patient updated. Mayra Jolliffe, Bettina Gavia RN

## 2020-05-19 NOTE — Progress Notes (Signed)
Patient called to go to restroom, Upon entering patient room, patient sitting in wheel chair, patient had recently been transported to xray and in wheel chair in room.  Per patient x-ray not done at this time due to a machine being down. Patient assisted to rest room from wheel chair. Will monitor patient. Ayeisha Lindenberger, Bettina Gavia RN

## 2020-05-19 NOTE — Progress Notes (Signed)
[facility]  Date of Admission:  05/13/2020     Total days of antibiotics 7         ASSESSMENT:  Matthew Serrano is POD 4 from aortic valve replacement with human allograft with multiple sets of blood cultures being without growth to date and surgical specimen with culture positive for Streptococcus anginosis. There was also gram negative rods noted on gram stain with microlab evaluating for possible anaerobe which would updated tomorrow. Does not appear to need anaerobic coverage currently as blood cultures were positive for Streptococcus anginosis. Ceftriaxone will provide broad gram coverage in the interim. Agree with [name] Cath removal as it was in place when he was bacteremic. Will need 6 weeks of IV antibiotics.  Final recommendations pending microbiology results.  PLAN:  1. Continue ceftriaxone. 2. Monitor cultures any other organisms if indeed present.  3. PICC placement once Porta Cath removed.  4. Rest of management per primary team.  Principal Problem:   S/P aortic root replacement with human allograft Active Problems:   Hypothyroidism   Diabetes mellitus (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 acetaminophen  1,000 mg Oral Q6H  . aspirin EC  325 mg Oral Daily  . bisacodyl  10 mg Oral Daily   Or  . bisacodyl  10 mg Rectal Daily  . carvedilol  12.5 mg Oral BID WC  . chlorhexidine  15 mL Mouth/Throat BID  . Chlorhexidine Gluconate Cloth  6 each Topical Daily  . [facility]   Does not  apply Once  . docusate sodium  200 mg Oral Daily  . doxazosin  8 mg Oral QHS  . enoxaparin (LOVENOX) injection  30 mg Subcutaneous QHS  . furosemide  40 mg Oral Daily  . mouth rinse  15 mL Mouth Rinse BID  . pantoprazole  40 mg Oral Daily  . potassium chloride  40 mEq Oral TID WC  . sodium chloride flush  10-40 mL Intracatheter Q12H  . sodium chloride flush  3 mL Intravenous Q12H    SUBJECTIVE:  Afebrile overnight with no acute events. Blood cultures have been without growth to date. Valve specimen with gram positive cocci and gram negative rods on gram stain and culture growing Streptococcus anginosis. Feeling better today. Short of breath at times with mild chest discomfort.    No Known Allergies   Review of Systems: Review of Systems  Constitutional: Negative for chills, fever and weight loss.  Respiratory: Negative for cough, shortness of breath and wheezing.   Cardiovascular: Negative for chest pain and leg swelling.  Gastrointestinal: Negative for abdominal pain, constipation, diarrhea, nausea and vomiting.  Skin: Negative for rash.      OBJECTIVE: Vitals:   05/18/20 2057 05/19/20 0006 05/19/20 0329 05/19/20 0800  BP: 126/89 104/74 122/83 96/67  Pulse: 97 (!) 103 (!) 110 (!) 123  Resp: 18 17 20 20   Temp: 98.8 F (37.1 C) 98.2 F (36.8 C) 98.2 F (36.8 C) 98.9 F (37.2  C)  TempSrc: Oral Oral Oral Oral  SpO2: 99% 98% 98% 96%  Weight:   98.2 kg   Height:       Body mass index is 33.91 kg/m.  Physical Exam Constitutional:      General: He is not in acute distress.    Appearance: He is well-developed.     Comments: Seated in the chair; pleasant.   Cardiovascular:     Rate and Rhythm: Normal rate and regular rhythm.     Heart sounds: Normal heart sounds.     Comments: Chest wall incision is well appoximated, clean and dry.  Pulmonary:     Effort: Pulmonary effort is normal.     Breath sounds: Normal breath sounds.  Skin:    General: Skin is warm and dry.   Neurological:     Mental Status: He is alert and oriented to person, place, and time.  Psychiatric:        Behavior: Behavior normal.        Thought Content: Thought content normal.        Judgment: Judgment normal.     Lab Results Lab Results  Component Value Date   WBC 11.5 (H) 05/19/2020   HGB 11.9 (L) 05/19/2020   HCT 36.2 (L) 05/19/2020   MCV 87.7 05/19/2020   PLT 260 05/19/2020    Lab Results  Component Value Date   CREATININE 0.88 05/19/2020   BUN 8 05/19/2020   NA 143 05/19/2020   K 3.1 (L) 05/19/2020   CL 110 05/19/2020   CO2 25 05/19/2020    Lab Results  Component Value Date   ALT 30 05/14/2020   AST 28 05/14/2020   ALKPHOS 78 05/14/2020   BILITOT 1.0 05/14/2020     Microbiology: Recent Results (from the past 240 hour(s))  Urine culture     Status: Abnormal   Collection Time: 05/11/20  1:14 AM   Specimen: In/Out Cath Urine  Result Value Ref Range Status   Specimen Description   Final    IN/OUT CATH URINE Performed at [facility], [place] [address] [place], [place] [address]    Special Requests   Final    NONE Performed at [facility], [place] [address], [place], [place] [address]    Culture (A)  Final    80,000 COLONIES/mL KLEBSIELLA PNEUMONIAE 60,000 COLONIES/mL ESCHERICHIA COLI    Report Status 05/14/2020 FINAL  Final   Organism ID, Bacteria KLEBSIELLA PNEUMONIAE (A)  Final   Organism ID, Bacteria ESCHERICHIA COLI (A)  Final      Susceptibility   Escherichia coli - MIC*    AMPICILLIN <=2 SENSITIVE Sensitive     CEFAZOLIN <=4 SENSITIVE Sensitive     CEFTRIAXONE <=0.25 SENSITIVE Sensitive     CIPROFLOXACIN <=0.25 SENSITIVE Sensitive     GENTAMICIN <=1 SENSITIVE Sensitive     IMIPENEM <=0.25 SENSITIVE Sensitive     NITROFURANTOIN <=16 SENSITIVE Sensitive     TRIMETH/SULFA <=20 SENSITIVE Sensitive     AMPICILLIN/SULBACTAM <=2 SENSITIVE Sensitive     PIP/TAZO <=4 SENSITIVE Sensitive     * 60,000  COLONIES/mL ESCHERICHIA COLI   Klebsiella pneumoniae - MIC*    AMPICILLIN >=32 RESISTANT Resistant     CEFAZOLIN <=4 SENSITIVE Sensitive     CEFTRIAXONE <=0.25 SENSITIVE Sensitive     CIPROFLOXACIN <=0.25 SENSITIVE Sensitive     GENTAMICIN <=1 SENSITIVE Sensitive     IMIPENEM <=0.25 SENSITIVE Sensitive     NITROFURANTOIN 64 INTERMEDIATE Intermediate  TRIMETH/SULFA <=20 SENSITIVE Sensitive     AMPICILLIN/SULBACTAM 8 SENSITIVE Sensitive     PIP/TAZO <=4 SENSITIVE Sensitive     * 80,000 COLONIES/mL KLEBSIELLA PNEUMONIAE  Blood Culture (routine x 2)     Status: Abnormal   Collection Time: 05/11/20  1:14 AM   Specimen: BLOOD  Result Value Ref Range Status   Specimen Description   Final    BLOOD PORTA CATH Performed at [facility] [address] [place], [place] [address]    Special Requests   Final    BOTTLES DRAWN AEROBIC AND ANAEROBIC Blood Culture results may not be optimal due to an inadequate volume of blood received in culture bottles Performed at [facility] [address] [place], [place] [address]    Culture  Setup Time   Final    GRAM POSITIVE COCCI IN BOTH AEROBIC AND ANAEROBIC BOTTLES CRITICAL RESULT CALLED TO, READ BACK BY AND VERIFIED WITH: RN [place] [phone] FCP Performed at [facility], [place] [address] [place], [place] [address]    Culture STREPTOCOCCUS ANGINOSIS (A)  Final   Report Status 05/15/2020 FINAL  Final   Organism ID, Bacteria STREPTOCOCCUS ANGINOSIS  Final      Susceptibility   Streptococcus anginosis - MIC*    PENICILLIN <=0.06 SENSITIVE Sensitive     CEFTRIAXONE 0.25 SENSITIVE Sensitive     ERYTHROMYCIN <=0.12 SENSITIVE Sensitive     LEVOFLOXACIN 1 SENSITIVE Sensitive     VANCOMYCIN 0.5 SENSITIVE Sensitive     * STREPTOCOCCUS ANGINOSIS  Blood Culture ID Panel (Reflexed)     Status: Abnormal   Collection Time: 05/11/20  1:14 AM  Result Value Ref Range Status   Enterococcus faecalis NOT  DETECTED NOT DETECTED Final   Enterococcus Faecium NOT DETECTED NOT DETECTED Final   Listeria monocytogenes NOT DETECTED NOT DETECTED Final   Staphylococcus species NOT DETECTED NOT DETECTED Final   Staphylococcus aureus (BCID) NOT DETECTED NOT DETECTED Final   Staphylococcus epidermidis NOT DETECTED NOT DETECTED Final   Staphylococcus lugdunensis NOT DETECTED NOT DETECTED Final   Streptococcus species DETECTED (A) NOT DETECTED Final    Comment: Not Enterococcus species, Streptococcus agalactiae, Streptococcus pyogenes, or Streptococcus pneumoniae. CRITICAL RESULT CALLED TO, READ BACK BY AND VERIFIED WITH: RN JAMIE B. [phone] [id number] FCP    Streptococcus agalactiae NOT DETECTED NOT DETECTED Final   Streptococcus pneumoniae NOT DETECTED NOT DETECTED Final   Streptococcus pyogenes NOT DETECTED NOT DETECTED Final   A.calcoaceticus-baumannii NOT DETECTED NOT DETECTED Final   Bacteroides fragilis NOT DETECTED NOT DETECTED Final   Enterobacterales NOT DETECTED NOT DETECTED Final   Enterobacter cloacae complex NOT DETECTED NOT DETECTED Final   Escherichia coli NOT DETECTED NOT DETECTED Final   Klebsiella aerogenes NOT DETECTED NOT DETECTED Final   Klebsiella oxytoca NOT DETECTED NOT DETECTED Final   Klebsiella pneumoniae NOT DETECTED NOT DETECTED Final   Proteus species NOT DETECTED NOT DETECTED Final   Salmonella species NOT DETECTED NOT DETECTED Final   Serratia marcescens NOT DETECTED NOT DETECTED Final   Haemophilus influenzae NOT DETECTED NOT DETECTED Final   Neisseria meningitidis NOT DETECTED NOT DETECTED Final   Pseudomonas aeruginosa NOT DETECTED NOT DETECTED Final   Stenotrophomonas maltophilia NOT DETECTED NOT DETECTED Final   Candida albicans NOT DETECTED NOT DETECTED Final   Candida auris NOT DETECTED NOT DETECTED Final   Candida glabrata NOT DETECTED NOT DETECTED Final   Candida krusei NOT DETECTED NOT DETECTED Final   Candida parapsilosis NOT  DETECTED NOT DETECTED Final    Candida tropicalis NOT DETECTED NOT DETECTED Final   Cryptococcus neoformans/gattii NOT DETECTED NOT DETECTED Final    Comment: Performed at [facility], [place] [address] [place], [place] [address]  SARS Coronavirus 2 by RT PCR (hospital order, performed in [facility] hospital lab) Nasopharyngeal Nasopharyngeal Swab     Status: None   Collection Time: 05/11/20  1:15 AM   Specimen: Nasopharyngeal Swab  Result Value Ref Range Status   SARS Coronavirus 2 NEGATIVE NEGATIVE Final    Comment: (NOTE) SARS-CoV-2 target nucleic acids are NOT DETECTED.  The SARS-CoV-2 RNA is generally detectable in upper and lower respiratory specimens during the acute phase of infection. The lowest concentration of SARS-CoV-2 viral copies this assay can detect is 250 copies / mL. A negative result does not preclude SARS-CoV-2 infection and should not be used as the sole basis for treatment or other patient management decisions.  A negative result may occur with improper specimen collection / handling, submission of specimen other than nasopharyngeal swab, presence of viral mutation(s) within the areas targeted by this assay, and inadequate number of viral copies (<250 copies / mL). A negative result must be combined with clinical observations, patient history, and epidemiological information.  Fact Sheet for Patients:   StrictlyIdeas.no  Fact Sheet for Healthcare Providers: BankingDealers.co.za  This test is not yet approved or  cleared by the [place] FDA and has been authorized for detection and/or diagnosis of SARS-CoV-2 by FDA under an Emergency Use Authorization (EUA).  This EUA will remain in effect (meaning this test can be used) for the duration of the COVID-19 declaration under Section 564(b)(1) of the Act, 21 U.S.C. section 360bbb-3(b)(1), unless the authorization is terminated or revoked sooner.  Performed at [facility], [place] [address], [place], [place] [address]   Blood Culture (routine x 2)     Status: Abnormal   Collection Time: 05/13/20 12:04 PM   Specimen: BLOOD  Result Value Ref Range Status   Specimen Description   Final    BLOOD RIGHT ANTECUBITAL Performed at [facility] [address] [place], [place] [address]    Special Requests   Final    BOTTLES DRAWN AEROBIC AND ANAEROBIC Blood Culture results may not be optimal due to an excessive volume of blood received in culture bottles Performed at [facility] [address], [place], [place] [address]    Culture  Setup Time   Final    GRAM POSITIVE COCCI IN CHAINS IN BOTH AEROBIC AND ANAEROBIC BOTTLES CRITICAL RESULT CALLED TO, READ BACK BY AND VERIFIED WITH: J. LEDFORD,PHARMD 0602 05/15/2020 T. TYSOR    Culture (A)  Final    STREPTOCOCCUS ANGINOSIS SUSCEPTIBILITIES PERFORMED ON PREVIOUS CULTURE WITHIN THE LAST 5 DAYS. Performed at [facility], [place] [address] [place], [place] [address]    Report Status 05/17/2020 FINAL  Final  Blood Culture (routine x 2)     Status: Abnormal   Collection Time: 05/13/20 12:04 PM   Specimen: BLOOD  Result Value Ref Range Status   Specimen Description   Final    BLOOD LEFT ANTECUBITAL Performed at [facility] [address] [place], [place] [address]    Special Requests   Final    BOTTLES DRAWN AEROBIC AND ANAEROBIC Blood Culture adequate volume Performed at [facility] [address] [place], [place] [address]    Culture  Setup Time  Final    GRAM POSITIVE COCCI IN CHAINS IN BOTH AEROBIC AND ANAEROBIC BOTTLES CRITICAL RESULT CALLED TO, READ BACK BY AND VERIFIED WITH: J. LEDFORD,PHARMD 0602 05/15/2020 T. TYSOR    Culture (A)  Final    STREPTOCOCCUS ANGINOSIS SUSCEPTIBILITIES PERFORMED ON PREVIOUS CULTURE WITHIN THE LAST 5 DAYS. Performed at [facility], [place] [address], [place], [place]  [address]    Report Status 05/17/2020 FINAL  Final  SARS Coronavirus 2 by RT PCR (hospital order, performed in [facility] hospital lab) Nasopharyngeal Nasopharyngeal Swab     Status: None   Collection Time: 05/13/20 12:20 PM   Specimen: Nasopharyngeal Swab  Result Value Ref Range Status   SARS Coronavirus 2 NEGATIVE NEGATIVE Final    Comment: (NOTE) SARS-CoV-2 target nucleic acids are NOT DETECTED.  The SARS-CoV-2 RNA is generally detectable in upper and lower respiratory specimens during the acute phase of infection. The lowest concentration of SARS-CoV-2 viral copies this assay can detect is 250 copies / mL. A negative result does not preclude SARS-CoV-2 infection and should not be used as the sole basis for treatment or other patient management decisions.  A negative result may occur with improper specimen collection / handling, submission of specimen other than nasopharyngeal swab, presence of viral mutation(s) within the areas targeted by this assay, and inadequate number of viral copies (<250 copies / mL). A negative result must be combined with clinical observations, patient history, and epidemiological information.  Fact Sheet for Patients:   StrictlyIdeas.no  Fact Sheet for Healthcare Providers: BankingDealers.co.za  This test is not yet approved or  cleared by the [place] FDA and has been authorized for detection and/or diagnosis of SARS-CoV-2 by FDA under an Emergency Use Authorization (EUA).  This EUA will remain in effect (meaning this test can be used) for the duration of the COVID-19 declaration under Section 564(b)(1) of the Act, 21 U.S.C. section 360bbb-3(b)(1), unless the authorization is terminated or revoked sooner.  Performed at [facility], [place] [address] [place] , [place] [address]   Urine culture     Status: None   Collection Time: 05/13/20  1:10 PM   Specimen: In/Out Cath Urine    Result Value Ref Range Status   Specimen Description   Final    IN/OUT CATH URINE Performed at [facility] [address] [place], [place] [address]    Special Requests   Final    NONE Performed at [facility], [place] [address], [place], [place] [address]    Culture   Final    NO GROWTH Performed at [facility], [place] [address], [place], [place] [address]    Report Status 05/14/2020 FINAL  Final  MRSA PCR Screening     Status: None   Collection Time: 05/14/20  3:56 PM   Specimen: Nasal Mucosa; Nasopharyngeal  Result Value Ref Range Status   MRSA by PCR NEGATIVE NEGATIVE Final    Comment:        The GeneXpert MRSA Assay (FDA approved for NASAL specimens only), is one component of a comprehensive MRSA colonization surveillance program. It is not intended to diagnose MRSA infection nor to guide or monitor treatment for MRSA infections. Performed at [facility], [place] [address] [place], [place] [address]   Surgical pcr screen     Status: None   Collection Time: 05/14/20 10:33 PM   Specimen: Nasal Mucosa; Nasal Swab  Result Value Ref Range Status  MRSA, PCR NEGATIVE NEGATIVE Final   Staphylococcus aureus NEGATIVE NEGATIVE Final    Comment: (NOTE) The Xpert SA Assay (FDA approved for NASAL specimens in patients 39 years of age and older), is one component of a comprehensive surveillance program. It is not intended to diagnose infection nor to guide or monitor treatment. Performed at [facility], [place] [address] [place], [place] [address]   Aerobic/Anaerobic Culture (surgical/deep wound)     Status: None (Preliminary result)   Collection Time: 05/15/20 10:22 AM   Specimen: Tissue  Result Value Ref Range Status   Specimen Description TISSUE AORTIC VALVE VEGETATION  Final   Special Requests A  Final   Gram Stain   Final    FEW WBC PRESENT,BOTH PMN AND MONONUCLEAR FEW GRAM NEGATIVE RODS RARE GRAM POSITIVE COCCI  IN PAIRS IN CLUSTERS    Culture   Final    FEW STREPTOCOCCUS ANGINOSIS SUSCEPTIBILITIES TO FOLLOW Performed at [facility], [place] [address] [place], [place] [address]    Report Status PENDING  Incomplete  Culture, blood (routine x 2)     Status: None (Preliminary result)   Collection Time: 05/16/20 12:14 PM   Specimen: BLOOD  Result Value Ref Range Status   Specimen Description BLOOD LEFT ANTECUBITAL  Final   Special Requests   Final    BOTTLES DRAWN AEROBIC AND ANAEROBIC Blood Culture results may not be optimal due to an inadequate volume of blood received in culture bottles   Culture   Final    NO GROWTH 3 DAYS Performed at [facility], [place] [address], [place], [place] [address]    Report Status PENDING  Incomplete  Culture, blood (routine x 2)     Status: None (Preliminary result)   Collection Time: 05/16/20 12:14 PM   Specimen: BLOOD  Result Value Ref Range Status   Specimen Description BLOOD LEFT ANTECUBITAL  Final   Special Requests   Final    BOTTLES DRAWN AEROBIC ONLY Blood Culture results may not be optimal due to an inadequate volume of blood received in culture bottles   Culture   Final    NO GROWTH 3 DAYS Performed at [facility], [place] [address], [place], [place] [address]    Report Status PENDING  Incomplete  Culture, blood (Routine X 2) w Reflex to ID Panel     Status: None (Preliminary result)   Collection Time: 05/17/20  6:00 AM   Specimen: BLOOD  Result Value Ref Range Status   Specimen Description BLOOD LEFT ANTECUBITAL  Final   Special Requests   Final    BOTTLES DRAWN AEROBIC AND ANAEROBIC Blood Culture adequate volume   Culture   Final    NO GROWTH 2 DAYS Performed at [facility], [place] [address], [place], [place] [address]    Report Status PENDING  Incomplete  Culture, blood (Routine X 2) w Reflex to ID Panel     Status: None (Preliminary result)   Collection Time: 05/17/20  6:04 AM   Specimen: BLOOD LEFT HAND  Result  Value Ref Range Status   Specimen Description BLOOD LEFT HAND  Final   Special Requests   Final    BOTTLES DRAWN AEROBIC AND ANAEROBIC Blood Culture adequate volume   Culture   Final    NO GROWTH 2 DAYS Performed at [facility], [place] [address] [place], [place] [address]    Report Status PENDING  Incomplete  Terri Piedra, NP [facility]  05/19/2020  11:23 AM

## 2020-05-19 NOTE — Anesthesia Preprocedure Evaluation (Addendum)
Anesthesia Evaluation  Patient identified by MRN, date of birth, ID band Patient awake    Reviewed: Allergy & Precautions, NPO status , Patient's Chart, lab work & pertinent test results  History of Anesthesia Complications Negative for: history of anesthetic complications  Airway Mallampati: III  TM Distance: >3 FB Neck ROM: Full    Dental  (+) Dental Advisory Given, Loose, Poor Dentition, Missing,    Pulmonary former smoker,    Pulmonary exam normal        Cardiovascular hypertension, Pt. on medications and Pt. 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 A moderately sized, circumferential pericardial effusion is present. There is no evidence of cardiac tamponade. There is a moderate pleural effusion. Trivial MR. After AVR, no AI noted.    Neuro/Psych negative neurological ROS  negative psych ROS   GI/Hepatic negative GI ROS, Neg liver ROS,   Endo/Other  diabetes, Type 2, Oral Hypoglycemic AgentsHypothyroidism  Hypokalemia - resolved Hypocalcemia, Ca 7.2    Renal/GU negative Renal ROS     Musculoskeletal negative musculoskeletal ROS (+)   Abdominal   Peds  Hematology  (+) anemia ,  INR 1.6 on 9/23    Anesthesia Other Findings Covid test negative   Reproductive/Obstetrics                            Anesthesia Physical Anesthesia Plan  ASA: III  Anesthesia Plan: MAC   Post-op Pain Management:    Induction: Intravenous  PONV Risk Score and Plan: 1 and Propofol infusion and Treatment may vary due to age or medical condition  Airway Management Planned: Natural Airway and Simple Face Mask  Additional Equipment: None  Intra-op Plan:   Post-operative Plan:   Informed Consent: I have  reviewed the patients History and Physical, chart, labs and discussed the procedure including the risks, benefits and alternatives for the proposed anesthesia with the patient or authorized representative who has indicated his/her understanding and acceptance.       Plan Discussed with: CRNA and Anesthesiologist  Anesthesia Plan Comments:        Anesthesia Quick Evaluation

## 2020-05-19 NOTE — Progress Notes (Signed)
CARDIAC REHAB PHASE I   PRE:  Rate/Rhythm: 114 ST  BP:  Supine:   Sitting: 113/81     SaO2: 97% RA  MODE:  Ambulation: 420 ft   POST:  Rate/Rhythem: 137 ST, 126 Resting  BP:  Supine:   Sitting: 104/82    SaO2: 99% RA  Pt was lying in bed upon arrival. Pt was mostly independent with getting out of bed. Pt ambulated 37 ft with front wheel walker.Pt walked on room air with asst x1. Pt did not complain or have any concerns with ambulation. Returned pt to chair. Discussed IS use and sternal precautions.  [phone]   Rick Duff, MS, CEP 05/19/2020 10:07 AM

## 2020-05-19 NOTE — Progress Notes (Addendum)
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 Discussed timing of dental extraction with patient's oral surgeon, Dr.Todd Benson [place], who plans to postpone surgery for 1-2 weeks but complete while patient remains on IV antibiotics.   Rexene Alberts, MD 05/19/2020 5:52 PM

## 2020-05-20 ENCOUNTER — Inpatient Hospital Stay: Payer: Self-pay

## 2020-05-20 ENCOUNTER — Inpatient Hospital Stay (HOSPITAL_COMMUNITY): Payer: Medicare Other | Admitting: Certified Registered"

## 2020-05-20 ENCOUNTER — Ambulatory Visit: Payer: Medicare Other | Admitting: Gastroenterology

## 2020-05-20 ENCOUNTER — Encounter (HOSPITAL_COMMUNITY)
Admission: EM | Disposition: A | Discharge status: Skilled Nursing Facility | Payer: Self-pay | Source: Home / Self Care | Attending: Thoracic Surgery (Cardiothoracic Vascular Surgery)

## 2020-05-20 HISTORY — PX: PORT-A-CATH REMOVAL: SHX5289

## 2020-05-20 LAB — AEROBIC/ANAEROBIC CULTURE W GRAM STAIN (SURGICAL/DEEP WOUND)

## 2020-05-20 LAB — CBC
HCT: 35 % — ABNORMAL LOW (ref 39.0–52.0)
Hemoglobin: 11.3 g/dL — ABNORMAL LOW (ref 13.0–17.0)
MCH: 28.7 pg (ref 26.0–34.0)
MCHC: 32.3 g/dL (ref 30.0–36.0)
MCV: 88.8 fL (ref 80.0–100.0)
Platelets: 274 10*3/uL (ref 150–400)
RBC: 3.94 MIL/uL — ABNORMAL LOW (ref 4.22–5.81)
RDW: 17.5 % — ABNORMAL HIGH (ref 11.5–15.5)
WBC: 11.3 10*3/uL — ABNORMAL HIGH (ref 4.0–10.5)
nRBC: 0 % (ref 0.0–0.2)

## 2020-05-20 LAB — BASIC METABOLIC PANEL
Anion gap: 8 (ref 5–15)
BUN: 8 mg/dL (ref 8–23)
CO2: 24 mmol/L (ref 22–32)
Calcium: 7.2 mg/dL — ABNORMAL LOW (ref 8.9–10.3)
Chloride: 109 mmol/L (ref 98–111)
Creatinine, Ser: 0.93 mg/dL (ref 0.61–1.24)
GFR calc Af Amer: >60 mL/min (ref >60)
GFR calc non Af Amer: >60 mL/min (ref >60)
Glucose, Bld: 134 mg/dL — ABNORMAL HIGH (ref 70–99)
Potassium: 4.3 mmol/L (ref 3.5–5.1)
Sodium: 141 mmol/L (ref 135–145)

## 2020-05-20 LAB — GLUCOSE, CAPILLARY: Glucose-Capillary: 109 mg/dL — ABNORMAL HIGH (ref 70–99)

## 2020-05-20 SURGERY — REMOVAL PORT-A-CATH
Anesthesia: Monitor Anesthesia Care | Site: Chest | Laterality: Right

## 2020-05-20 MED ORDER — PHENYLEPHRINE HCL-NACL 10-0.9 MG/250ML-% IV SOLN
INTRAVENOUS | Status: DC | PRN
  Administered 2020-05-20: 20 ug/min via INTRAVENOUS

## 2020-05-20 MED ORDER — PROPOFOL 500 MG/50ML IV EMUL
INTRAVENOUS | Status: AC
  Filled 2020-05-20: qty 50

## 2020-05-20 MED ORDER — LIDOCAINE HCL (PF) 1 % IJ SOLN
INTRAMUSCULAR | Status: AC
  Filled 2020-05-20: qty 30

## 2020-05-20 MED ORDER — CARVEDILOL 12.5 MG PO TABS
12.5000 mg | ORAL_TABLET | Freq: Two times a day (BID) | ORAL | Status: DC
  Administered 2020-05-20 – 2020-05-21 (×3): 12.5 mg via ORAL
  Filled 2020-05-20 (×2): qty 1

## 2020-05-20 MED ORDER — BUPIVACAINE HCL (PF) 0.5 % IJ SOLN
INTRAMUSCULAR | Status: AC
  Filled 2020-05-20: qty 30

## 2020-05-20 MED ORDER — FENTANYL CITRATE (PF) 100 MCG/2ML IJ SOLN: 25.0000 ug | INTRAMUSCULAR | Status: DC | PRN

## 2020-05-20 MED ORDER — PROPOFOL 500 MG/50ML IV EMUL
INTRAVENOUS | Status: DC | PRN
  Administered 2020-05-20: 100 ug/kg/min via INTRAVENOUS

## 2020-05-20 MED ORDER — FENTANYL CITRATE (PF) 100 MCG/2ML IJ SOLN
INTRAMUSCULAR | Status: AC
  Filled 2020-05-20: qty 2

## 2020-05-20 MED ORDER — LIDOCAINE-EPINEPHRINE (PF) 1 %-1:200000 IJ SOLN
INTRAMUSCULAR | Status: AC
  Filled 2020-05-20: qty 30

## 2020-05-20 MED ORDER — ONDANSETRON HCL 4 MG/2ML IJ SOLN: 4.0000 mg | Freq: Once | INTRAMUSCULAR | Status: DC | PRN

## 2020-05-20 MED ORDER — OXYCODONE HCL 5 MG PO TABS: 5.0000 mg | ORAL_TABLET | Freq: Once | ORAL | Status: DC | PRN

## 2020-05-20 MED ORDER — BUPIVACAINE LIPOSOME 1.3 % IJ SUSP
INTRAMUSCULAR | Status: DC | PRN
  Administered 2020-05-20: 20 mL

## 2020-05-20 MED ORDER — BUPIVACAINE LIPOSOME 1.3 % IJ SUSP
20.0000 mL | Freq: Once | INTRAMUSCULAR | Status: DC
  Filled 2020-05-20: qty 20

## 2020-05-20 MED ORDER — LIDOCAINE 2% (20 MG/ML) 5 ML SYRINGE
INTRAMUSCULAR | Status: DC | PRN
  Administered 2020-05-20: 80 mg via INTRAVENOUS

## 2020-05-20 MED ORDER — BUPIVACAINE-EPINEPHRINE 0.5% -1:200000 IJ SOLN
INTRAMUSCULAR | Status: AC
  Filled 2020-05-20: qty 1

## 2020-05-20 MED ORDER — OXYCODONE HCL 5 MG/5ML PO SOLN: 5.0000 mg | Freq: Once | ORAL | Status: DC | PRN

## 2020-05-20 MED ORDER — PROPOFOL 10 MG/ML IV BOLUS
INTRAVENOUS | Status: DC | PRN
  Administered 2020-05-20: 80 mg via INTRAVENOUS

## 2020-05-20 MED ORDER — BUPIVACAINE HCL 0.5 % IJ SOLN
INTRAMUSCULAR | Status: DC | PRN
  Administered 2020-05-20: 30 mL

## 2020-05-20 MED ORDER — PROPOFOL 1000 MG/100ML IV EMUL
INTRAVENOUS | Status: AC
  Filled 2020-05-20: qty 300

## 2020-05-20 MED ORDER — DEXMEDETOMIDINE HCL 200 MCG/2ML IV SOLN
INTRAVENOUS | Status: DC | PRN
  Administered 2020-05-20: 16 ug via INTRAVENOUS

## 2020-05-20 MED ORDER — FENTANYL CITRATE (PF) 100 MCG/2ML IJ SOLN
INTRAMUSCULAR | Status: DC | PRN
  Administered 2020-05-20 (×3): 25 ug via INTRAVENOUS

## 2020-05-20 MED ORDER — 0.9 % SODIUM CHLORIDE (POUR BTL) OPTIME
TOPICAL | Status: DC | PRN
  Administered 2020-05-20: 1000 mL

## 2020-05-20 SURGICAL SUPPLY — 32 items
ADH SKN CLS APL DERMABOND .7 (GAUZE/BANDAGES/DRESSINGS) ×1
BLADE CLIPPER SURG (BLADE) ×2 IMPLANT
BLADE SURG 11 STRL SS (BLADE) ×2 IMPLANT
CANISTER SUCT 3000ML PPV (MISCELLANEOUS) ×2 IMPLANT
COVER SURGICAL LIGHT HANDLE (MISCELLANEOUS) ×2 IMPLANT
DERMABOND ADVANCED (GAUZE/BANDAGES/DRESSINGS) ×1
DERMABOND ADVANCED .7 DNX12 (GAUZE/BANDAGES/DRESSINGS) ×1 IMPLANT
DRAPE LAPAROTOMY T 102X78X121 (DRAPES) ×2 IMPLANT
ELECT REM PT RETURN 9FT ADLT (ELECTROSURGICAL) ×2
ELECTRODE REM PT RTRN 9FT ADLT (ELECTROSURGICAL) ×1 IMPLANT
FILTER SMOKE EVAC ULPA (FILTER) ×2 IMPLANT
GAUZE SPONGE 4X4 12PLY STRL (GAUZE/BANDAGES/DRESSINGS) ×2 IMPLANT
GLOVE BIO SURGEON STRL SZ 6.5 (GLOVE) ×4 IMPLANT
GOWN STRL REUS W/ TWL LRG LVL3 (GOWN DISPOSABLE) ×2 IMPLANT
GOWN STRL REUS W/TWL LRG LVL3 (GOWN DISPOSABLE) ×4
KIT BASIN OR (CUSTOM PROCEDURE TRAY) ×2 IMPLANT
KIT TURNOVER KIT B (KITS) ×2 IMPLANT
NEEDLE 22X1 1/2 (OR ONLY) (NEEDLE) ×2 IMPLANT
NS IRRIG 1000ML POUR BTL (IV SOLUTION) ×2 IMPLANT
PACK GENERAL/GYN (CUSTOM PROCEDURE TRAY) ×2 IMPLANT
PAD ARMBOARD 7.5X6 YLW CONV (MISCELLANEOUS) ×4 IMPLANT
PENCIL SMOKE EVACUATOR (MISCELLANEOUS) ×2 IMPLANT
SLEEVE SUCTION 125 (MISCELLANEOUS) ×2 IMPLANT
SUT MNCRL AB 4-0 PS2 18 (SUTURE) ×1 IMPLANT
SUT VIC AB 3-0 SH 18 (SUTURE) IMPLANT
SUT VIC AB 3-0 SH 8-18 (SUTURE) ×2 IMPLANT
SUT VICRYL 4-0 PS2 18IN ABS (SUTURE) ×2 IMPLANT
SYR CONTROL 10ML LL (SYRINGE) ×2 IMPLANT
TOWEL GREEN STERILE (TOWEL DISPOSABLE) ×2 IMPLANT
TOWEL GREEN STERILE FF (TOWEL DISPOSABLE) ×2 IMPLANT
TRAP FLUID SMOKE EVACUATOR (MISCELLANEOUS) ×2 IMPLANT
WATER STERILE IRR 1000ML POUR (IV SOLUTION) ×2 IMPLANT

## 2020-05-20 NOTE — Progress Notes (Signed)
TCTS BRIEF PROGRESS NOTE  Day of Surgery  S/P Procedure(s) (LRB): REMOVAL PORT-A-CATH (Right)   Doing well NSR w/ stable BP Breathing comfortably on room air Ambulating fairly well  Plan: Anticipate d/c to SNF in 1-2 days once PICC line in place and appropriate arrangements for d/c have been made.  Rexene Alberts, MD 05/20/2020 5:18 PM

## 2020-05-20 NOTE — Transfer of Care (Signed)
Immediate Anesthesia Transfer of Care Note  Patient: Matthew Serrano.  Procedure(s) Performed: REMOVAL PORT-A-CATH (Right Chest)  Patient Location: PACU  Anesthesia Type:MAC  Level of Consciousness: awake, alert , oriented and patient cooperative  Airway & Oxygen Therapy: Patient Spontanous Breathing and Patient connected to face mask oxygen  Post-op Assessment: Report given to RN and Post -op Vital signs reviewed and stable  Post vital signs: Reviewed and stable  Last Vitals:  Vitals Value Taken Time  BP 115/77 05/20/20 0813  Temp    Pulse 99 05/20/20 0812  Resp 22 05/20/20 0812  SpO2 98 % 05/20/20 0812  Vitals shown include unvalidated device data.  Last Pain:  Vitals:   05/20/20 0507  TempSrc: Oral  PainSc:       Patients Stated Pain Goal: 2 (43/60/67 7034)  Complications: No complications documented.

## 2020-05-20 NOTE — Anesthesia Postprocedure Evaluation (Signed)
Anesthesia Post Note  Patient: Matthew Serrano.  Procedure(s) Performed: REMOVAL PORT-A-CATH (Right Chest)     Patient location during evaluation: PACU Anesthesia Type: MAC Level of consciousness: awake and alert Pain management: pain level controlled Vital Signs Assessment: post-procedure vital signs reviewed and stable Respiratory status: spontaneous breathing, nonlabored ventilation and respiratory function stable Cardiovascular status: stable and blood pressure returned to baseline Anesthetic complications: no   No complications documented.  Last Vitals:  Vitals:   05/20/20 0830 05/20/20 0845  BP: 136/87 (!) 136/99  Pulse: 100 100  Resp: (!) 24 20  Temp:  36.8 C  SpO2: 97% 99%    Last Pain:  Vitals:   05/20/20 0845  TempSrc:   PainSc: 0-No pain                 Audry Pili

## 2020-05-20 NOTE — OR Nursing (Signed)
Dr Roxy Manns removed pacing wires in operating room 05/20/2020 0800

## 2020-05-20 NOTE — Progress Notes (Signed)
      [address]       [place] [address]             [phone]     CARDIOTHORACIC SURGERY PROGRESS NOTE  Subjective: Matthew Serrano. has been scheduled for REMOVAL OF PORT-A-CATH (right) today.   Objective: Vital signs in last 24 hours: Temp:  [98.1 F (36.7 C)-99.1 F (37.3 C)] 98.7 F (37.1 C) (09/28 0507) Pulse Rate:  [94-131] 100 (09/28 0507) Cardiac Rhythm: Normal sinus rhythm (09/28 0110) Resp:  [16-25] 16 (09/28 0507) BP: (90-122)/(67-84) 122/76 (09/28 0507) SpO2:  [96 %-100 %] 99 % (09/28 0507) Weight:  [99.7 kg] 99.7 kg (09/28 0226)  Physical Exam: Unchanged from previously   Intake/Output from previous day: 09/27 0701 - 09/28 0700 In: 100 [IV Piggyback:100] Out: -  Intake/Output this shift: No intake/output data recorded.  Lab Results: Recent Labs    05/19/20 0318 05/20/20 0328  WBC 11.5* 11.3*  HGB 11.9* 11.3*  HCT 36.2* 35.0*  PLT 260 274   BMET:  Recent Labs    05/19/20 0318 05/20/20 0328  NA 143 141  K 3.1* 4.3  CL 110 109  CO2 25 24  GLUCOSE 99 134*  BUN 8 8  CREATININE 0.88 0.93  CALCIUM 7.1* 7.2*    CBG (last 3)  Recent Labs    05/18/20 2055 05/19/20 0559 05/19/20 1141  GLUCAP 101* 94 159*   PT/INR:  No results for input(s): LABPROT, INR in the last 72 hours.  Assessment/Plan:   The various methods of treatment have been discussed with the patient. After consideration of the risks, benefits and treatment options the patient has consented to the planned procedure.   The patient has been seen and labs reviewed. There are no changes in the patient's condition to prevent proceeding with the planned procedure today.   Matthew Alberts, MD 05/20/2020 5:21 AM

## 2020-05-20 NOTE — Op Note (Addendum)
CARDIOTHORACIC SURGERY OPERATIVE NOTE  Date of Procedure:   05/20/2020  Preoperative Diagnosis:  Bacterial Endocarditis  Postoperative Diagnosis:  same  Procedure:    Removal of Port-a-cath  Surgeon:    Valentina Gu. Roxy Manns, MD  Assistant:    n/a  Anesthesia:    0.5% bupivacaine and liposomal bupivacaine local with monitored anesthesia care    DETAILS OF THE OPERATIVE PROCEDURE  The patient is brought to the operating room on the above mentioned date and placed in the supine position on the operating table.  Intravenous sedation is administered by the anesthesia team.  The patient's anterior chest is prepared and draped in a sterile manner.  A timeout procedure was performed.  A mixture of Exparel liposomal bupivacaine and 0.5% bupivacaine is utilized to anesthetize the skin and subcutaneous tissues immediately surrounding the patient's existing Port-A-Cath in the right anterior chest.  The previous scar from Port-A-Cath placement is reopened.  The Port-A-Cath is exposed and initially the long vascular catheter was removed.  The subcutaneous tract leading to the subclavian vein is ligated with 3-0 Vicryl suture.  The entire Port-A-Cath and catheter is subsequently removed uneventfully.  The chronic fibrous tissue surrounding the Port-A-Cath is excised.  Meticulous hemostasis is ascertained.  The small incision is closed multiple layers with interrupted 3-0 Vicryl suture.  The skin incision is closed with running 4-0 Monocryl closure and Dermabond.  The patient's existing temporary pacemaker wires are removed uneventfully.  The patient tolerated the procedure well and was transported to the postanesthesia care unit in stable condition.  Estimated blood loss was trivial.  There were no complications.     Valentina Gu. Roxy Manns MD 05/20/2020 8:08 AM

## 2020-05-20 NOTE — Progress Notes (Signed)
CARDIAC REHAB PHASE I   PRE:  Rate/Rhythm: 95 SR  BP:  Supine: 106/59  Sitting:   Standing:    SaO2: 96%RA  MODE:  Ambulation: 330 ft   POST:  Rate/Rhythm: 108 ST  BP:  Supine: 109/71  Sitting:   Standing:    SaO2: 95%RA 1258-1330 Pt walked 330 ft on RA with rolling walker with steady gait. Asst x 1. Did not need to stop and rest. Tolerated well. Wanted to go back to bed for a nap. Bed alarm on.   Graylon Good, RN BSN  05/20/2020 1:26 PM

## 2020-05-20 NOTE — Progress Notes (Signed)
Matthew Serrano for Infectious Disease   Reason for visit: Follow up on endocarditis  Interval History: s/p Aortic valve replacement and aortic root reconstruction 9/23 and port-a-cath removal today. He is eating now, family member at bedside.  No complaints.  Repeat blood cultures ngtd. Tissue culture with Strep anginosis and gram stain with GNR but did not grow out   Physical Exam: Constitutional:  Vitals:   05/20/20 0900 05/20/20 1106  BP: 134/83 (!) 139/94  Pulse: (!) 103 100  Resp: 18 20  Temp: 98.8 F (37.1 C) 98.7 F (37.1 C)  SpO2: 97% 97%   patient appears in NAD Respiratory: Normal respiratory effort; CTA B Cardiovascular: RRR GI: soft, nt, nd  Review of Systems: Constitutional: negative for fevers and chills Gastrointestinal: negative for nausea and diarrhea Integument/breast: negative for rash  Lab Results  Component Value Date   WBC 11.3 (H) 05/20/2020   HGB 11.3 (L) 05/20/2020   HCT 35.0 (L) 05/20/2020   MCV 88.8 05/20/2020   PLT 274 05/20/2020    Lab Results  Component Value Date   CREATININE 0.93 05/20/2020   BUN 8 05/20/2020   NA 141 05/20/2020   K 4.3 05/20/2020   CL 109 05/20/2020   CO2 24 05/20/2020    Lab Results  Component Value Date   ALT 30 05/14/2020   AST 28 05/14/2020   ALKPHOS 78 05/14/2020     Microbiology: Recent Results (from the past 240 hour(s))  Urine culture     Status: Abnormal   Collection Time: 05/11/20  1:14 AM   Specimen: In/Out Cath Urine  Result Value Ref Range Status   Specimen Description   Final    IN/OUT CATH URINE Performed at [facility], [place] [address] [place], [place] [address]    Special Requests   Final    NONE Performed at [facility], [place] [address] [place], [place] [address]    Culture (A)  Final    80,000 COLONIES/mL KLEBSIELLA PNEUMONIAE 60,000 COLONIES/mL ESCHERICHIA COLI    Report Status 05/14/2020 FINAL  Final   Organism ID, Bacteria  KLEBSIELLA PNEUMONIAE (A)  Final   Organism ID, Bacteria ESCHERICHIA COLI (A)  Final      Susceptibility   Escherichia coli - MIC*    AMPICILLIN <=2 SENSITIVE Sensitive     CEFAZOLIN <=4 SENSITIVE Sensitive     CEFTRIAXONE <=0.25 SENSITIVE Sensitive     CIPROFLOXACIN <=0.25 SENSITIVE Sensitive     GENTAMICIN <=1 SENSITIVE Sensitive     IMIPENEM <=0.25 SENSITIVE Sensitive     NITROFURANTOIN <=16 SENSITIVE Sensitive     TRIMETH/SULFA <=20 SENSITIVE Sensitive     AMPICILLIN/SULBACTAM <=2 SENSITIVE Sensitive     PIP/TAZO <=4 SENSITIVE Sensitive     * 60,000 COLONIES/mL ESCHERICHIA COLI   Klebsiella pneumoniae - MIC*    AMPICILLIN >=32 RESISTANT Resistant     CEFAZOLIN <=4 SENSITIVE Sensitive     CEFTRIAXONE <=0.25 SENSITIVE Sensitive     CIPROFLOXACIN <=0.25 SENSITIVE Sensitive     GENTAMICIN <=1 SENSITIVE Sensitive     IMIPENEM <=0.25 SENSITIVE Sensitive     NITROFURANTOIN 64 INTERMEDIATE Intermediate     TRIMETH/SULFA <=20 SENSITIVE Sensitive     AMPICILLIN/SULBACTAM 8 SENSITIVE Sensitive     PIP/TAZO <=4 SENSITIVE Sensitive     * 80,000 COLONIES/mL KLEBSIELLA PNEUMONIAE  Blood Culture (routine x 2)     Status: Abnormal   Collection Time: 05/11/20  1:14 AM   Specimen: BLOOD  Result  Value Ref Range Status   Specimen Description   Final    BLOOD PORTA CATH Performed at [place] [address], [place], [place] [address]    Special Requests   Final    BOTTLES DRAWN AEROBIC AND ANAEROBIC Blood Culture results may not be optimal due to an inadequate volume of blood received in culture bottles Performed at [facility] [address] [place], [place] [address]    Culture  Setup Time   Final    GRAM POSITIVE COCCI IN BOTH AEROBIC AND ANAEROBIC BOTTLES CRITICAL RESULT CALLED TO, READ BACK BY AND VERIFIED WITH: RN Marlin [phone] FCP Performed at [facility], [place] [address] [place], [place] [address]    Culture STREPTOCOCCUS  ANGINOSIS (A)  Final   Report Status 05/15/2020 FINAL  Final   Organism ID, Bacteria STREPTOCOCCUS ANGINOSIS  Final      Susceptibility   Streptococcus anginosis - MIC*    PENICILLIN <=0.06 SENSITIVE Sensitive     CEFTRIAXONE 0.25 SENSITIVE Sensitive     ERYTHROMYCIN <=0.12 SENSITIVE Sensitive     LEVOFLOXACIN 1 SENSITIVE Sensitive     VANCOMYCIN 0.5 SENSITIVE Sensitive     * STREPTOCOCCUS ANGINOSIS  Blood Culture ID Panel (Reflexed)     Status: Abnormal   Collection Time: 05/11/20  1:14 AM  Result Value Ref Range Status   Enterococcus faecalis NOT DETECTED NOT DETECTED Final   Enterococcus Faecium NOT DETECTED NOT DETECTED Final   Listeria monocytogenes NOT DETECTED NOT DETECTED Final   Staphylococcus species NOT DETECTED NOT DETECTED Final   Staphylococcus aureus (BCID) NOT DETECTED NOT DETECTED Final   Staphylococcus epidermidis NOT DETECTED NOT DETECTED Final   Staphylococcus lugdunensis NOT DETECTED NOT DETECTED Final   Streptococcus species DETECTED (A) NOT DETECTED Final    Comment: Not Enterococcus species, Streptococcus agalactiae, Streptococcus pyogenes, or Streptococcus pneumoniae. CRITICAL RESULT CALLED TO, READ BACK BY AND VERIFIED WITH: RN JAMIE B. [phone] [id number] FCP    Streptococcus agalactiae NOT DETECTED NOT DETECTED Final   Streptococcus pneumoniae NOT DETECTED NOT DETECTED Final   Streptococcus pyogenes NOT DETECTED NOT DETECTED Final   A.calcoaceticus-baumannii NOT DETECTED NOT DETECTED Final   Bacteroides fragilis NOT DETECTED NOT DETECTED Final   Enterobacterales NOT DETECTED NOT DETECTED Final   Enterobacter cloacae complex NOT DETECTED NOT DETECTED Final   Escherichia coli NOT DETECTED NOT DETECTED Final   Klebsiella aerogenes NOT DETECTED NOT DETECTED Final   Klebsiella oxytoca NOT DETECTED NOT DETECTED Final   Klebsiella pneumoniae NOT DETECTED NOT DETECTED Final   Proteus species NOT DETECTED NOT DETECTED Final   Salmonella species NOT DETECTED NOT  DETECTED Final   Serratia marcescens NOT DETECTED NOT DETECTED Final   Haemophilus influenzae NOT DETECTED NOT DETECTED Final   Neisseria meningitidis NOT DETECTED NOT DETECTED Final   Pseudomonas aeruginosa NOT DETECTED NOT DETECTED Final   Stenotrophomonas maltophilia NOT DETECTED NOT DETECTED Final   Candida albicans NOT DETECTED NOT DETECTED Final   Candida auris NOT DETECTED NOT DETECTED Final   Candida glabrata NOT DETECTED NOT DETECTED Final   Candida krusei NOT DETECTED NOT DETECTED Final   Candida parapsilosis NOT DETECTED NOT DETECTED Final   Candida tropicalis NOT DETECTED NOT DETECTED Final   Cryptococcus neoformans/gattii NOT DETECTED NOT DETECTED Final    Comment: Performed at [facility], [address] [place], [place] [address]  SARS Coronavirus 2 by RT PCR (hospital order, performed in [facility] hospital lab)  Nasopharyngeal Nasopharyngeal Swab     Status: None   Collection Time: 05/11/20  1:15 AM   Specimen: Nasopharyngeal Swab  Result Value Ref Range Status   SARS Coronavirus 2 NEGATIVE NEGATIVE Final    Comment: (NOTE) SARS-CoV-2 target nucleic acids are NOT DETECTED.  The SARS-CoV-2 RNA is generally detectable in upper and lower respiratory specimens during the acute phase of infection. The lowest concentration of SARS-CoV-2 viral copies this assay can detect is 250 copies / mL. A negative result does not preclude SARS-CoV-2 infection and should not be used as the sole basis for treatment or other patient management decisions.  A negative result may occur with improper specimen collection / handling, submission of specimen other than nasopharyngeal swab, presence of viral mutation(s) within the areas targeted by this assay, and inadequate number of viral copies (<250 copies / mL). A negative result must be combined with clinical observations, patient history, and epidemiological information.  Fact Sheet for Patients:     StrictlyIdeas.no  Fact Sheet for Healthcare Providers: BankingDealers.co.za  This test is not yet approved or  cleared by the [place] FDA and has been authorized for detection and/or diagnosis of SARS-CoV-2 by FDA under an Emergency Use Authorization (EUA).  This EUA will remain in effect (meaning this test can be used) for the duration of the COVID-19 declaration under Section 564(b)(1) of the Act, 21 U.S.C. section 360bbb-3(b)(1), unless the authorization is terminated or revoked sooner.  Performed at [facility], [place] [address] [place], [place] [address]   Blood Culture (routine x 2)     Status: Abnormal   Collection Time: 05/13/20 12:04 PM   Specimen: BLOOD  Result Value Ref Range Status   Specimen Description   Final    BLOOD RIGHT ANTECUBITAL Performed at [place] [address] [place], [place] [address]    Special Requests   Final    BOTTLES DRAWN AEROBIC AND ANAEROBIC Blood Culture results may not be optimal due to an excessive volume of blood received in culture bottles Performed at [facility] [address] [place], [place] [address]    Culture  Setup Time   Final    GRAM POSITIVE COCCI IN CHAINS IN BOTH AEROBIC AND ANAEROBIC BOTTLES CRITICAL RESULT CALLED TO, READ BACK BY AND VERIFIED WITH: J. LEDFORD,PHARMD 0602 05/15/2020 T. TYSOR    Culture (A)  Final    STREPTOCOCCUS ANGINOSIS SUSCEPTIBILITIES PERFORMED ON PREVIOUS CULTURE WITHIN THE LAST 5 DAYS. Performed at [facility], [place] [address] [place], [place] [address]    Report Status 05/17/2020 FINAL  Final  Blood Culture (routine x 2)     Status: Abnormal   Collection Time: 05/13/20 12:04 PM   Specimen: BLOOD  Result Value Ref Range Status   Specimen Description   Final    BLOOD LEFT ANTECUBITAL Performed at [place] [address] [place], [place]  [address]    Special Requests   Final    BOTTLES DRAWN AEROBIC AND ANAEROBIC Blood Culture adequate volume Performed at [place] [address] [place], [place] [address]    Culture  Setup Time   Final    GRAM POSITIVE COCCI IN CHAINS IN BOTH AEROBIC AND ANAEROBIC BOTTLES CRITICAL RESULT CALLED TO, READ BACK BY AND VERIFIED WITH: J. LEDFORD,PHARMD 0602 05/15/2020 T. TYSOR    Culture (A)  Final    STREPTOCOCCUS ANGINOSIS SUSCEPTIBILITIES PERFORMED ON PREVIOUS CULTURE WITHIN THE LAST 5  DAYS. Performed at [facility], [place] [address] [place], [place] [address]    Report Status 05/17/2020 FINAL  Final  SARS Coronavirus 2 by RT PCR (hospital order, performed in [facility] hospital lab) Nasopharyngeal Nasopharyngeal Swab     Status: None   Collection Time: 05/13/20 12:20 PM   Specimen: Nasopharyngeal Swab  Result Value Ref Range Status   SARS Coronavirus 2 NEGATIVE NEGATIVE Final    Comment: (NOTE) SARS-CoV-2 target nucleic acids are NOT DETECTED.  The SARS-CoV-2 RNA is generally detectable in upper and lower respiratory specimens during the acute phase of infection. The lowest concentration of SARS-CoV-2 viral copies this assay can detect is 250 copies / mL. A negative result does not preclude SARS-CoV-2 infection and should not be used as the sole basis for treatment or other patient management decisions.  A negative result may occur with improper specimen collection / handling, submission of specimen other than nasopharyngeal swab, presence of viral mutation(s) within the areas targeted by this assay, and inadequate number of viral copies (<250 copies / mL). A negative result must be combined with clinical observations, patient history, and epidemiological information.  Fact Sheet for Patients:   StrictlyIdeas.no  Fact Sheet for Healthcare Providers: BankingDealers.co.za  This test is not yet approved  or  cleared by the [place] FDA and has been authorized for detection and/or diagnosis of SARS-CoV-2 by FDA under an Emergency Use Authorization (EUA).  This EUA will remain in effect (meaning this test can be used) for the duration of the COVID-19 declaration under Section 564(b)(1) of the Act, 21 U.S.C. section 360bbb-3(b)(1), unless the authorization is terminated or revoked sooner.  Performed at [facility], [place] [address], [place], [place] [address]   Urine culture     Status: None   Collection Time: 05/13/20  1:10 PM   Specimen: In/Out Cath Urine  Result Value Ref Range Status   Specimen Description   Final    IN/OUT CATH URINE Performed at [place] [address] [place], [place] [address]    Special Requests   Final    NONE Performed at [facility], [place] [address] [place], [place] [address]    Culture   Final    NO GROWTH Performed at [facility], [place] [address], [place], [place] [address]    Report Status 05/14/2020 FINAL  Final  MRSA PCR Screening     Status: None   Collection Time: 05/14/20  3:56 PM   Specimen: Nasal Mucosa; Nasopharyngeal  Result Value Ref Range Status   MRSA by PCR NEGATIVE NEGATIVE Final    Comment:        The GeneXpert MRSA Assay (FDA approved for NASAL specimens only), is one component of a comprehensive MRSA colonization surveillance program. It is not intended to diagnose MRSA infection nor to guide or monitor treatment for MRSA infections. Performed at [facility], [place] [address], [place],  [address]   Surgical pcr screen     Status: None   Collection Time: 05/14/20 10:33 PM   Specimen: Nasal Mucosa; Nasal Swab  Result Value Ref Range Status   MRSA, PCR NEGATIVE NEGATIVE Final   Staphylococcus aureus NEGATIVE NEGATIVE Final    Comment: (NOTE) The Xpert SA Assay (FDA approved for NASAL specimens in patients 85 years of age and older), is  one component of a comprehensive surveillance program. It is not intended to diagnose infection nor to guide or  monitor treatment. Performed at [facility], [place] [address], [place], [place] [address]   Aerobic/Anaerobic Culture (surgical/deep wound)     Status: None   Collection Time: 05/15/20 10:22 AM   Specimen: Tissue  Result Value Ref Range Status   Specimen Description TISSUE AORTIC VALVE VEGETATION  Final   Special Requests A  Final   Gram Stain   Final    FEW WBC PRESENT,BOTH PMN AND MONONUCLEAR FEW GRAM NEGATIVE RODS RARE GRAM POSITIVE COCCI IN PAIRS IN CLUSTERS    Culture   Final    FEW STREPTOCOCCUS ANGINOSIS GRAM NEGATIVE RODS ORGANISM NOT VIABLE CRITICAL RESULT CALLED TO, READ BACK BY AND VERIFIED WITH: Chanetta Marshall [id number] AT 1112 BY CM Performed at [facility], [place] [address] [place], [place] [address]    Report Status 05/20/2020 FINAL  Final   Organism ID, Bacteria STREPTOCOCCUS ANGINOSIS  Final      Susceptibility   Streptococcus anginosis - MIC*    PENICILLIN <=0.06 SENSITIVE Sensitive     CEFTRIAXONE <=0.12 SENSITIVE Sensitive     ERYTHROMYCIN <=0.12 SENSITIVE Sensitive     LEVOFLOXACIN 1 SENSITIVE Sensitive     VANCOMYCIN 0.5 SENSITIVE Sensitive     * FEW STREPTOCOCCUS ANGINOSIS  Culture, blood (routine x 2)     Status: None (Preliminary result)   Collection Time: 05/16/20 12:14 PM   Specimen: BLOOD  Result Value Ref Range Status   Specimen Description BLOOD LEFT ANTECUBITAL  Final   Special Requests   Final    BOTTLES DRAWN AEROBIC AND ANAEROBIC Blood Culture results may not be optimal due to an inadequate volume of blood received in culture bottles   Culture   Final    NO GROWTH 4 DAYS Performed at [facility], [place] [address], [place], [place] [address]    Report Status PENDING  Incomplete  Culture, blood (routine x 2)     Status: None (Preliminary result)   Collection Time: 05/16/20 12:14 PM   Specimen: BLOOD  Result Value  Ref Range Status   Specimen Description BLOOD LEFT ANTECUBITAL  Final   Special Requests   Final    BOTTLES DRAWN AEROBIC ONLY Blood Culture results may not be optimal due to an inadequate volume of blood received in culture bottles   Culture   Final    NO GROWTH 4 DAYS Performed at [facility], [place] [address] [place], [place] [address]    Report Status PENDING  Incomplete  Culture, blood (Routine X 2) w Reflex to ID Panel     Status: None (Preliminary result)   Collection Time: 05/17/20  6:00 AM   Specimen: BLOOD  Result Value Ref Range Status   Specimen Description BLOOD LEFT ANTECUBITAL  Final   Special Requests   Final    BOTTLES DRAWN AEROBIC AND ANAEROBIC Blood Culture adequate volume   Culture   Final    NO GROWTH 3 DAYS Performed at [facility], [place] [address], [place], [place] [address]    Report Status PENDING  Incomplete  Culture, blood (Routine X 2) w Reflex to ID Panel     Status: None (Preliminary result)   Collection Time: 05/17/20  6:04 AM   Specimen: BLOOD LEFT HAND  Result Value Ref Range Status   Specimen Description BLOOD LEFT HAND  Final   Special Requests   Final    BOTTLES DRAWN AEROBIC AND ANAEROBIC Blood Culture adequate volume   Culture  Final    NO GROWTH 3 DAYS Performed at [facility], [place] [address], [place], [place] [address]    Report Status PENDING  Incomplete    Impression/Plan:  1. Aortic valve endocarditis with Strep anginosis - repeat cultures are clear, port removed.  Picc line ordered by primary team. Will continue antibiotics for 6 weeks through 11/4 with ceftriaxone Will continue to monitor cultures Dental evaluation/management in 1-2 weeks  2.  dispo - going to SNF in 1-2 days.

## 2020-05-21 ENCOUNTER — Encounter (HOSPITAL_COMMUNITY): Payer: Self-pay | Admitting: Thoracic Surgery (Cardiothoracic Vascular Surgery)

## 2020-05-21 DIAGNOSIS — Z8679 Personal history of other diseases of the circulatory system: Secondary | ICD-10-CM

## 2020-05-21 DIAGNOSIS — I5031 Acute diastolic (congestive) heart failure: Secondary | ICD-10-CM

## 2020-05-21 LAB — CULTURE, BLOOD (ROUTINE X 2)
Culture: NO GROWTH
Culture: NO GROWTH

## 2020-05-21 LAB — SARS CORONAVIRUS 2 BY RT PCR (HOSPITAL ORDER, PERFORMED IN ~~LOC~~ HOSPITAL LAB): SARS Coronavirus 2: NEGATIVE

## 2020-05-21 MED ORDER — SODIUM CHLORIDE 0.9% FLUSH
10.0000 mL | INTRAVENOUS | Status: DC | PRN
  Administered 2020-05-21 – 2020-05-23 (×2): 10 mL

## 2020-05-21 MED ORDER — SODIUM CHLORIDE 0.9% FLUSH
10.0000 mL | Freq: Two times a day (BID) | INTRAVENOUS | Status: DC
  Administered 2020-05-22: 10 mL

## 2020-05-21 MED ORDER — CARVEDILOL 25 MG PO TABS
25.0000 mg | ORAL_TABLET | Freq: Two times a day (BID) | ORAL | Status: DC
  Administered 2020-05-21 – 2020-05-23 (×4): 25 mg via ORAL
  Filled 2020-05-21 (×4): qty 1

## 2020-05-21 MED ORDER — CARVEDILOL 12.5 MG PO TABS
12.5000 mg | ORAL_TABLET | Freq: Once | ORAL | Status: AC
  Administered 2020-05-21: 12.5 mg via ORAL
  Filled 2020-05-21: qty 1

## 2020-05-21 MED ORDER — FUROSEMIDE 40 MG PO TABS
40.0000 mg | ORAL_TABLET | Freq: Two times a day (BID) | ORAL | Status: DC
  Administered 2020-05-21 – 2020-05-23 (×4): 40 mg via ORAL
  Filled 2020-05-21 (×4): qty 1

## 2020-05-21 NOTE — Evaluation (Signed)
Physical Therapy Evaluation Patient Details Name: Matthew Serrano. MRN: [id number] DOB: [birth year] Today's Date: 05/21/2020   History of Present Illness  Matthew Serrano. is a 74 y.o. male with a hx of severe aortic stenosis, hypertension, hyperlipidemia, diabetes mellitus, prostate cancer, recent GI bleed who is being seen today for the evaluation of subacute bacterial endocarditis. Pt underwent aortic root replacement on 9/23.     Clinical Impression  Pt admitted with above. Pt lived alone prior to admit. Pt now requiring use of RW and minA for safe ADLS, transfers, and ambulation while adhering to sternal precautions. Pt unable to care for self at this time and doesn't have family or friends available to provide 24/7 assist. Pt to benefit from SNF upon d/c to achieve safe mod I level of function prior to d/c home.    Follow Up Recommendations SNF;Supervision/Assistance - 24 hour    Equipment Recommendations  Rolling walker with 5" wheels    Recommendations for Other Services       Precautions / Restrictions Precautions Precautions: Fall;Sternal Precaution Booklet Issued: No Precaution Comments: educated pt, pt able to recall at end of session Required Braces or Orthoses: Other Brace Other Brace: use of heart pillow for coughing and transfers Restrictions Weight Bearing Restrictions: Yes Other Position/Activity Restrictions: sternal precautions      Mobility  Bed Mobility Overal bed mobility: Needs Assistance Bed Mobility: Rolling;Sidelying to Sit Rolling: Min assist Sidelying to sit: Min assist       General bed mobility comments: HOB elevated, pt brought LEs off EOB, minA via tactile cues to roll to the L and push up on L elbow  Transfers Overall transfer level: Needs assistance Equipment used: Rolling walker (2 wheeled) Transfers: Sit to/from Stand Sit to Stand: Min assist         General transfer comment: minA to power up, held onto heart pillow to not  push up with hands, also instructed with placing hands on knees  Ambulation/Gait Ambulation/Gait assistance: Min assist Gait Distance (Feet): 200 Feet Assistive device: Rolling walker (2 wheeled);None Gait Pattern/deviations: Step-through pattern;Decreased stride length;Trunk flexed Gait velocity: slow Gait velocity interpretation: <1.31 ft/sec, indicative of household ambulator General Gait Details: pt began amb with RW, pt steady with fluid gait pattern, verbal cues to decreased bilat UE support, trialed ambulation without AD, pt unsteady, short step height and length and had L knee buckling and LOB to the L requiring modA to prevent fall. pt safer with RW  Stairs Stairs: Yes Stairs assistance: Min assist Stair Management: One rail Right;Step to pattern;Sideways Number of Stairs: 5 General stair comments: max directional verbal cues for technique and proper footplacement to safely place both feet on the step, pt unable to do so, minimal carry over  Wheelchair Mobility    Modified Rankin (Stroke Patients Only)       Balance Overall balance assessment: Needs assistance Sitting-balance support: Feet supported;No upper extremity supported Sitting balance-Leahy Scale: Fair     Standing balance support: No upper extremity supported Standing balance-Leahy Scale: Poor Standing balance comment: dependent oN RW                             Pertinent Vitals/Pain Pain Assessment: No/denies pain    Home Living Family/patient expects to be discharged to:: Private residence Living Arrangements: Alone Available Help at Discharge: Friend(s);Available PRN/intermittently Type of Home: House Home Access: Level entry     Home Layout: Multi-level;Laundry or  work area in [employer]: None      Prior Function Level of Independence: Independent         Comments: driving, grocery shopped, cooked, Chiropodist Dominance   Dominant Hand: Right     Extremity/Trunk Assessment   Upper Extremity Assessment Upper Extremity Assessment: Generalized weakness    Lower Extremity Assessment Lower Extremity Assessment: Generalized weakness    Cervical / Trunk Assessment Cervical / Trunk Assessment: Other exceptions Cervical / Trunk Exceptions: sternal incision  Communication   Communication: No difficulties  Cognition Arousal/Alertness: Awake/alert Behavior During Therapy: WFL for tasks assessed/performed Overall Cognitive Status: No family/caregiver present to determine baseline cognitive functioning                                 General Comments: pt oriented and able to follow commands but demonstrates inconsistencies in his PLOF report and home set up report. Pt with delayed response time however could be do to pt Matthew Serrano      General Comments General comments (skin integrity, edema, etc.): healing sternal incision, VSS, RN placed ted hose on patient, noted bilat LE edema    Exercises     Assessment/Plan    PT Assessment Patient needs continued PT services  PT Problem List Decreased strength;Decreased activity tolerance;Decreased balance;Decreased mobility;Decreased coordination;Decreased cognition;Decreased knowledge of use of DME;Decreased safety awareness       PT Treatment Interventions DME instruction;Gait training;Stair training;Functional mobility training;Therapeutic activities;Therapeutic exercise;Balance training    PT Goals (Current goals can be found in the Care Plan section)  Acute Rehab PT Goals Patient Stated Goal: get back to independence PT Goal Formulation: With patient Time For Goal Achievement: 06/04/20 Potential to Achieve Goals: Good    Frequency Min 3X/week   Barriers to discharge Decreased caregiver support lives alone    Co-evaluation               AM-PAC PT "6 Clicks" Mobility  Outcome Measure Help needed turning from your back to your side while in a flat bed without  using bedrails?: A Little Help needed moving from lying on your back to sitting on the side of a flat bed without using bedrails?: A Little Help needed moving to and from a bed to a chair (including a wheelchair)?: A Little Help needed standing up from a chair using your arms (e.g., wheelchair or bedside chair)?: A Little Help needed to walk in Serrano room?: A Little Help needed climbing 3-5 steps with a railing? : A Little 6 Click Score: 18    End of Session Equipment Utilized During Treatment: Gait belt Activity Tolerance: Patient tolerated treatment well Patient left: in chair;with call bell/phone within reach;with chair alarm set Nurse Communication: Mobility status PT Visit Diagnosis: Unsteadiness on feet (R26.81);Muscle weakness (generalized) (M62.81);Difficulty in walking, not elsewhere classified (R26.2)    Time: 1191-4782 PT Time Calculation (min) (ACUTE ONLY): 26 min   Charges:   PT Evaluation $PT Eval Moderate Complexity: 1 Mod PT Treatments $Gait Training: 8-22 mins        Matthew Serrano, PT, DPT Acute Rehabilitation Services Pager #: [phone] Office #: [phone]   Matthew Serrano 05/21/2020, 12:59 PM

## 2020-05-21 NOTE — Progress Notes (Signed)
Peripherally Inserted Central Catheter Placement  The IV Nurse has discussed with the patient and/or persons authorized to consent for the patient, the purpose of this procedure and the potential benefits and risks involved with this procedure.  The benefits include less needle sticks, lab draws from the catheter, and the patient may be discharged home with the catheter. Risks include, but not limited to, infection, bleeding, blood clot (thrombus formation), and puncture of an artery; nerve damage and irregular heartbeat and possibility to perform a PICC exchange if needed/ordered by physician.  Alternatives to this procedure were also discussed.  Bard Power PICC patient education guide, fact sheet on infection prevention and patient information card has been provided to patient /or left at bedside.    PICC Placement Documentation  PICC Single Lumen 79/44/46 PICC Right Basilic 41 cm 0 cm (Active)  Indication for Insertion or Continuance of Line Prolonged intravenous therapies 05/21/20 0940  Exposed Catheter (cm) 0 cm 05/21/20 0940  Site Assessment Clean;Dry;Intact 05/21/20 0940  Line Status Flushed;Blood return noted;Saline locked 05/21/20 0940  Dressing Type Transparent 05/21/20 0940  Dressing Status Clean;Dry;Intact 05/21/20 0940  Dressing Change Due 05/28/20 05/21/20 0940       Scotty Court 05/21/2020, 9:42 AM

## 2020-05-21 NOTE — Progress Notes (Signed)
Progress Note  Patient Name: Matthew Serrano. Date of Encounter: 05/21/2020  [facility] Cardiologist: [name], MD   Subjective   No complaints   Inpatient Medications    Scheduled Meds: . aspirin EC  325 mg Oral Daily  . bisacodyl  10 mg Oral Daily   Or  . bisacodyl  10 mg Rectal Daily  . carvedilol  12.5 mg Oral BID WC  . chlorhexidine  15 mL Mouth/Throat BID  . Chlorhexidine Gluconate Cloth  6 each Topical Daily  . [facility] Patient & Family Education   Does not apply Once  . docusate sodium  200 mg Oral Daily  . doxazosin  8 mg Oral QHS  . enoxaparin (LOVENOX) injection  30 mg Subcutaneous QHS  . furosemide  40 mg Oral Daily  . mouth rinse  15 mL Mouth Rinse BID  . pantoprazole  40 mg Oral Daily  . sodium chloride flush  10-40 mL Intracatheter Q12H  . sodium chloride flush  3 mL Intravenous Q12H   Continuous Infusions: . sodium chloride Stopped (05/16/20 0950)  . sodium chloride 10 mL/hr at 05/18/20 1400  . cefTRIAXone (ROCEPHIN)  IV 2 g (05/20/20 1318)  . lactated ringers 10 mL/hr at 05/20/20 0721   PRN Meds: sodium chloride, dextrose, metoprolol tartrate, ondansetron (ZOFRAN) IV, oxyCODONE, sodium chloride flush, sodium chloride flush, traMADol   Vital Signs    Vitals:   05/21/20 0500 05/21/20 0546 05/21/20 0547 05/21/20 0803  BP:  126/86 (!) 141/93 134/84  Pulse:  (!) 103 (!) 103 100  Resp:  (!) 22 20 13   Temp:  98.7 F (37.1 C) 98.7 F (37.1 C) 98.7 F (37.1 C)  TempSrc:  Oral Oral Oral  SpO2:  97% 97% 97%  Weight: 100.3 kg     Height:        Intake/Output Summary (Last 24 hours) at 05/21/2020 7829 Last data filed at 05/20/2020 1851 Gross per 24 hour  Intake 240 ml  Output --  Net 240 ml   Last 3 Weights 05/21/2020 05/20/2020 05/19/2020  Weight (lbs) 221 lb 1.9 oz 219 lb 12.8 oz 216 lb 7.9 oz  Weight (kg) 100.3 kg 99.701 kg 98.2 kg      Telemetry    SR to ST - Personally Reviewed  ECG    No new -  Personally Reviewed  Physical Exam  Per Dr. Radford Pax  GEN: No acute distress.   Neck: No JVD Cardiac: RRR, no murmurs, rubs, or gallops.  Respiratory: Clear to auscultation bilaterally. GI: Soft, nontender, non-distended  MS: No edema; No deformity. Neuro:  Nonfocal  Psych: Normal affect   Labs    High Sensitivity Troponin:  No results for input(s): TROPONINIHS in the last 720 hours.    Chemistry Recent Labs  Lab 05/14/20 2226 05/15/20 0157 05/18/20 0350 05/19/20 0318 05/20/20 0328  NA 140   < > 141 143 141  K 3.4*   < > 3.5 3.1* 4.3  CL 109   < > 108 110 109  CO2 19*   < > 24 25 24   GLUCOSE 99   < > 101* 99 134*  BUN 10   < > 7* 8 8  CREATININE 0.93   < > 0.84 0.88 0.93  CALCIUM 8.0*   < > 7.0* 7.1* 7.2*  PROT 5.1*  --   --   --   --   ALBUMIN 1.9*  --   --   --   --  AST 28  --   --   --   --   ALT 30  --   --   --   --   ALKPHOS 78  --   --   --   --   BILITOT 1.0  --   --   --   --   GFRNONAA >60   < > >60 >60 >60  GFRAA >60   < > >60 >60 >60  ANIONGAP 12   < > 9 8 8    < > = values in this interval not displayed.     Hematology Recent Labs  Lab 05/18/20 0350 05/19/20 0318 05/20/20 0328  WBC 11.0* 11.5* 11.3*  RBC 3.95* 4.13* 3.94*  HGB 11.0* 11.9* 11.3*  HCT 34.1* 36.2* 35.0*  MCV 86.3 87.7 88.8  MCH 27.8 28.8 28.7  MCHC 32.3 32.9 32.3  RDW 17.2* 17.2* 17.5*  PLT 210 260 274    BNPNo results for input(s): BNP, PROBNP in the last 168 hours.   DDimer No results for input(s): DDIMER in the last 168 hours.   Radiology    [place] EKG SITE RITE  Result Date: 05/20/2020 If Site Rite image not attached, placement could not be confirmed due to current cardiac rhythm.   Cardiac Studies    IMPRESSIONS    1. Left ventricular ejection fraction, by estimation, is 65 to 70%. The  left ventricle has normal function. The left ventricle has no regional  wall motion abnormalities. There is mild concentric left ventricular  hypertrophy. Left ventricular  diastolic  parameters are consistent with Grade II diastolic dysfunction  (pseudonormalization). Elevated left atrial pressure.  2. Right ventricular systolic function is normal. The right ventricular  size is normal. There is mildly elevated pulmonary artery systolic  pressure.  3. Left atrial size was mildly dilated.  4. The mitral valve is normal in structure. Mild mitral valve  regurgitation.  5. There is suspicion for a vegetation on both the left and the right  coronary cusps and an accompanying eccentric jet of aortic insufficiency  from the base of the right-left commisure. Cannot exclude a flail segment  of the right cusp. The aortic valve  is tricuspid. There is moderate calcification of the aortic valve. There  is moderate thickening of the aortic valve. Aortic valve regurgitation is  severe. Mild to moderate aortic valve stenosis.   TEE IMPRESSIONS    1. Left ventricular ejection fraction, by estimation, is 70 to 75%. The  left ventricle has hyperdynamic function. The left ventricle has no  regional wall motion abnormalities. There is mild concentric left  ventricular hypertrophy.  2. Right ventricular systolic function is hyperdynamic. The right  ventricular size is normal.  3. Left atrial size was mildly dilated. No left atrial/left atrial  appendage thrombus was detected.  4. A small pericardial effusion is present. The pericardial effusion is  circumferential. There is no evidence of cardiac tamponade.  5. The mitral valve is normal in structure. Trivial mitral valve  regurgitation.  6. There is endocarditis of the aortic valve with a torn/flail right  coronary cusp and a periannular abscess. The right coronary cusp, which  was previously severely limited in mobility, now has broad excursions in  systole and diastole, leading to a  reduced severity of the aortic stenosis. The aortic insufficiency jet is  highly eccentric, directed posteriorly and  parallel with the plane of the  annulus. The aortic valve is tricuspid. There is moderate calcification of  the aortic valve. There is  severe thickening of the aortic valve. Aortic valve regurgitation is  severe. Mild to moderate aortic valve stenosis.  7. 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 There is a fistula  connecting the aortic root at the level of the right coronary sinus to the  supraannular pulmonary artery with a large  continuous left to right shunt.. There is mild (Grade II) plaque  involving the descending aorta and ascending aorta.   FINDINGS  Left Ventricle: Left ventricular ejection fraction, by estimation, is 70  to 75%. The left ventricle has hyperdynamic function. The left ventricle  has no regional wall motion abnormalities. The left ventricular internal  cavity size was normal in size.  There is mild concentric left ventricular hypertrophy. Left ventricular  diastolic function could not be evaluated due to nondiagnostic images.   Right Ventricle: The right ventricular size is normal. No increase in  right ventricular wall thickness. Right ventricular systolic function is  hyperdynamic.   Left Atrium: Left atrial size was mildly dilated. No left atrial/left  atrial appendage thrombus was detected.   Patient Profile     74 y.o. 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 Additional medical issues include metastatic prostate Ca, DM, HTN, moderate nonobstructive CAD and recent GI bleed due to cecal angiodysplasia.    Assessment & Plan    1. Strep endocarditis:pan sensitive organism, on ceftriaxone. Gram stain of valve specimen was positive for Gram +ve cocci. No  fever, minimal elevation in WBC persists. Unfortunately at risk for reinfection of homograft due to need for emergent surgery before blood cultures were cleared. Will need PICC once central line DC'd (after cultures are sterile). 2. S/P AVR/root repair Homograft day # 6:making good progress, no Afib and no AV block.  To see Dr. Burt Knack 06/13/20  3. HTN:Off IV meds. DBP getting higher. He was on 5 antiHTN agents preop. Will switch from metoprolol to carvedilol (he was on clonidine and labetalol preop and will benefit from combined alpha-beta blockade). Today 141/93 on lasix, coreg 12.5 BID and cardura  4. UV:[id number] glucose control postop. 5. Nonobstructive CAD:no angina. On statin. 6. Prostate Ca 7.  Port a cath removal POD 1  8.  SR to slight ST to 107 increase coreg          For questions or updates, please contact [place] Please consult [email or web address] for contact info under        Signed, Cecilie Kicks, NP  05/21/2020, 8:11 AM

## 2020-05-21 NOTE — Progress Notes (Signed)
CARDIAC REHAB PHASE I   PRE:  Rate/Rhythm: 88 SR  BP:  Supine:   Sitting: 111/80  Standing:    SaO2: 96%RA  MODE:  Ambulation: 300 ft   POST:  Rate/Rhythm: 108 ST  BP:  Supine:   Sitting: 124/83  Standing:    SaO2: 95%RA 1409-1442 Pt walked 300 ft on RA with rolling walker and asst x 1 with fairly steady gait. Reinforced sternal precautions. Did not need to rest during walk. To bathroom and then to bed. Pt had been up in recliner. Bed alarm on and pt has call bell.    Graylon Good, RN BSN  05/21/2020 2:40 PM

## 2020-05-21 NOTE — Progress Notes (Addendum)
[address]       [place] [address]             [phone]      1 Day Post-Op Procedure(s) (LRB): REMOVAL PORT-A-CATH (Right) Subjective: conts to look and feel well  Objective: Vital signs in last 24 hours: Temp:  [98.2 F (36.8 C)-98.8 F (37.1 C)] 98.7 F (37.1 C) (09/29 0547) Pulse Rate:  [87-103] 103 (09/29 0547) Cardiac Rhythm: Normal sinus rhythm (09/28 1900) Resp:  [16-24] 20 (09/29 0547) BP: (103-141)/(75-99) 141/93 (09/29 0547) SpO2:  [96 %-100 %] 97 % (09/29 0547) Weight:  [100.3 kg] 100.3 kg (09/29 0500)  Hemodynamic parameters for last 24 hours:    Intake/Output from previous day: 09/28 0701 - 09/29 0700 In: 790 [P.O.:240; I.V.:550] Out: 5 [Blood:5] Intake/Output this shift: No intake/output data recorded.  General appearance: alert, cooperative and no distress Heart: regular rate and rhythm and faint rub Lungs: clear to auscultation bilaterally Abdomen: benign Extremities: + LE edema Wound: incis healing well  Lab Results: Recent Labs    05/19/20 0318 05/20/20 0328  WBC 11.5* 11.3*  HGB 11.9* 11.3*  HCT 36.2* 35.0*  PLT 260 274   BMET:  Recent Labs    05/19/20 0318 05/20/20 0328  NA 143 141  K 3.1* 4.3  CL 110 109  CO2 25 24  GLUCOSE 99 134*  BUN 8 8  CREATININE 0.88 0.93  CALCIUM 7.1* 7.2*    PT/INR: No results for input(s): LABPROT, INR in the last 72 hours. ABG    Component Value Date/Time   PHART 7.431 05/16/2020 0457   HCO3 23.2 05/16/2020 0457   TCO2 24 05/16/2020 0457   ACIDBASEDEF 1.0 05/16/2020 0457   O2SAT 92.0 05/16/2020 0457   CBG (last 3)  Recent Labs    05/19/20 0559 05/19/20 1141 05/20/20 0813  GLUCAP 94 159* 109*    Meds Scheduled Meds: . aspirin EC  325 mg Oral Daily  . bisacodyl  10 mg Oral Daily   Or  . bisacodyl  10 mg Rectal Daily  . carvedilol  12.5 mg Oral BID WC  . chlorhexidine  15 mL Mouth/Throat BID  . Chlorhexidine Gluconate Cloth  6 each Topical Daily  .  [facility], Patient [facility] Family Education   Does not apply Once  . docusate sodium  200 mg Oral Daily  . doxazosin  8 mg Oral QHS  . enoxaparin (LOVENOX) injection  30 mg Subcutaneous QHS  . furosemide  40 mg Oral Daily  . mouth rinse  15 mL Mouth Rinse BID  . pantoprazole  40 mg Oral Daily  . potassium chloride  40 mEq Oral TID WC  . sodium chloride flush  10-40 mL Intracatheter Q12H  . sodium chloride flush  3 mL Intravenous Q12H   Continuous Infusions: . sodium chloride Stopped (05/16/20 0950)  . sodium chloride 10 mL/hr at 05/18/20 1400  . cefTRIAXone (ROCEPHIN)  IV 2 g (05/20/20 1318)  . lactated ringers 10 mL/hr at 05/20/20 0721   PRN Meds:.sodium chloride, dextrose, metoprolol tartrate, ondansetron (ZOFRAN) IV, oxyCODONE, sodium chloride flush, sodium chloride flush, traMADol  Xrays [place] EKG SITE RITE  Result Date: 05/20/2020 If Site Rite image not attached, placement could not be confirmed due to current cardiac rhythm.  Results for orders placed or performed during the hospital encounter of 05/13/20  Blood Culture (routine x 2)     Status: Abnormal   Collection Time: 05/13/20 12:04  PM   Specimen: BLOOD  Result Value Ref Range Status   Specimen Description   Final    BLOOD RIGHT ANTECUBITAL Performed at [facility] [address], [place], [place] [address]    Special Requests   Final    BOTTLES DRAWN AEROBIC AND ANAEROBIC Blood Culture results may not be optimal due to an excessive volume of blood received in culture bottles Performed at [facility] [address], [place], [place] [address]    Culture  Setup Time   Final    GRAM POSITIVE COCCI IN CHAINS IN BOTH AEROBIC AND ANAEROBIC BOTTLES CRITICAL RESULT CALLED TO, READ BACK BY AND VERIFIED WITH: J. LEDFORD,PHARMD 0602 05/15/2020 T. TYSOR    Culture (A)  Final    STREPTOCOCCUS ANGINOSIS SUSCEPTIBILITIES PERFORMED ON PREVIOUS CULTURE WITHIN THE LAST 5  DAYS. Performed at [facility], [place] [address] [place], [place] [address]    Report Status 05/17/2020 FINAL  Final  Blood Culture (routine x 2)     Status: Abnormal   Collection Time: 05/13/20 12:04 PM   Specimen: BLOOD  Result Value Ref Range Status   Specimen Description   Final    BLOOD LEFT ANTECUBITAL Performed at [facility] [address], [place], [place] [address]    Special Requests   Final    BOTTLES DRAWN AEROBIC AND ANAEROBIC Blood Culture adequate volume Performed at [facility] [address] [place], [place] [address]    Culture  Setup Time   Final    GRAM POSITIVE COCCI IN CHAINS IN BOTH AEROBIC AND ANAEROBIC BOTTLES CRITICAL RESULT CALLED TO, READ BACK BY AND VERIFIED WITH: J. LEDFORD,PHARMD 0602 05/15/2020 T. TYSOR    Culture (A)  Final    STREPTOCOCCUS ANGINOSIS SUSCEPTIBILITIES PERFORMED ON PREVIOUS CULTURE WITHIN THE LAST 5 DAYS. Performed at [facility], [place] [address] [place],  [address]    Report Status 05/17/2020 FINAL  Final  SARS Coronavirus 2 by RT PCR (hospital order, performed in [facility] hospital lab) Nasopharyngeal Nasopharyngeal Swab     Status: None   Collection Time: 05/13/20 12:20 PM   Specimen: Nasopharyngeal Swab  Result Value Ref Range Status   SARS Coronavirus 2 NEGATIVE NEGATIVE Final    Comment: (NOTE) SARS-CoV-2 target nucleic acids are NOT DETECTED.  The SARS-CoV-2 RNA is generally detectable in upper and lower respiratory specimens during the acute phase of infection. The lowest concentration of SARS-CoV-2 viral copies this assay can detect is 250 copies / mL. A negative result does not preclude SARS-CoV-2 infection and should not be used as the sole basis for treatment or other patient management decisions.  A negative result may occur with improper specimen collection / handling, submission of specimen other than nasopharyngeal swab, presence of viral  mutation(s) within the areas targeted by this assay, and inadequate number of viral copies (<250 copies / mL). A negative result must be combined with clinical observations, patient history, and epidemiological information.  Fact Sheet for Patients:   StrictlyIdeas.no  Fact Sheet for Healthcare Providers: BankingDealers.co.za  This test is not yet approved or  cleared by the [place] FDA and has been authorized for detection and/or diagnosis of SARS-CoV-2 by FDA under an Emergency Use Authorization (EUA).  This EUA will remain in effect (meaning this test can be used) for the duration of the COVID-19 declaration under Section 564(b)(1) of the Act, 21 U.S.C. section 360bbb-3(b)(1), unless the authorization is terminated  or revoked sooner.  Performed at [facility], [place] [address], [place], [place] [address]   Urine culture     Status: None   Collection Time: 05/13/20  1:10 PM   Specimen: In/Out Cath Urine  Result Value Ref Range Status   Specimen Description   Final    IN/OUT CATH URINE Performed at [facility] [address] [place], [place] [address]    Special Requests   Final    NONE Performed at [facility], [place] [address], [place], [place] [address]    Culture   Final    NO GROWTH Performed at [facility], [place] [address], [place], [place] [address]    Report Status 05/14/2020 FINAL  Final  MRSA PCR Screening     Status: None   Collection Time: 05/14/20  3:56 PM   Specimen: Nasal Mucosa; Nasopharyngeal  Result Value Ref Range Status   MRSA by PCR NEGATIVE NEGATIVE Final    Comment:        The GeneXpert MRSA Assay (FDA approved for NASAL specimens only), is one component of a comprehensive MRSA colonization surveillance program. It is not intended to diagnose MRSA infection nor to guide or monitor treatment for MRSA infections. Performed at  [facility], [place] [address] [place], [place] [address]   Surgical pcr screen     Status: None   Collection Time: 05/14/20 10:33 PM   Specimen: Nasal Mucosa; Nasal Swab  Result Value Ref Range Status   MRSA, PCR NEGATIVE NEGATIVE Final   Staphylococcus aureus NEGATIVE NEGATIVE Final    Comment: (NOTE) The Xpert SA Assay (FDA approved for NASAL specimens in patients 59 years of age and older), is one component of a comprehensive surveillance program. It is not intended to diagnose infection nor to guide or monitor treatment. Performed at [facility], [place] [address], [place], [place] [address]   Aerobic/Anaerobic Culture (surgical/deep wound)     Status: None   Collection Time: 05/15/20 10:22 AM   Specimen: Tissue  Result Value Ref Range Status   Specimen Description TISSUE AORTIC VALVE VEGETATION  Final   Special Requests A  Final   Gram Stain   Final    FEW WBC PRESENT,BOTH PMN AND MONONUCLEAR FEW GRAM NEGATIVE RODS RARE GRAM POSITIVE COCCI IN PAIRS IN CLUSTERS    Culture   Final    FEW STREPTOCOCCUS ANGINOSIS GRAM NEGATIVE RODS ORGANISM NOT VIABLE CRITICAL RESULT CALLED TO, READ BACK BY AND VERIFIED WITH: Chanetta Marshall [id number] AT 1112 BY CM Performed at [facility], [place] [address], [place],  [address]    Report Status 05/20/2020 FINAL  Final   Organism ID, Bacteria STREPTOCOCCUS ANGINOSIS  Final      Susceptibility   Streptococcus anginosis - MIC*    PENICILLIN <=0.06 SENSITIVE Sensitive     CEFTRIAXONE <=0.12 SENSITIVE Sensitive     ERYTHROMYCIN <=0.12 SENSITIVE Sensitive     LEVOFLOXACIN 1 SENSITIVE Sensitive     VANCOMYCIN 0.5 SENSITIVE Sensitive     * FEW STREPTOCOCCUS ANGINOSIS  Culture, blood (routine x 2)     Status: None   Collection Time: 05/16/20 12:14 PM   Specimen: BLOOD  Result Value Ref Range Status   Specimen Description BLOOD LEFT ANTECUBITAL  Final   Special Requests   Final    BOTTLES DRAWN AEROBIC AND ANAEROBIC Blood  Culture results may not be optimal due to an inadequate volume  of blood received in culture bottles   Culture   Final    NO GROWTH 5 DAYS Performed at [facility], [place] [address] [place], [place] [address]    Report Status 05/21/2020 FINAL  Final  Culture, blood (routine x 2)     Status: None   Collection Time: 05/16/20 12:14 PM   Specimen: BLOOD  Result Value Ref Range Status   Specimen Description BLOOD LEFT ANTECUBITAL  Final   Special Requests   Final    BOTTLES DRAWN AEROBIC ONLY Blood Culture results may not be optimal due to an inadequate volume of blood received in culture bottles   Culture   Final    NO GROWTH 5 DAYS Performed at [facility], [place] [address] [place], [place] [address]    Report Status 05/21/2020 FINAL  Final  Culture, blood (Routine X 2) w Reflex to ID Panel     Status: None (Preliminary result)   Collection Time: 05/17/20  6:00 AM   Specimen: BLOOD  Result Value Ref Range Status   Specimen Description BLOOD LEFT ANTECUBITAL  Final   Special Requests   Final    BOTTLES DRAWN AEROBIC AND ANAEROBIC Blood Culture adequate volume   Culture   Final    NO GROWTH 4 DAYS Performed at [facility], [place] [address] [place], [place] [address]    Report Status PENDING  Incomplete  Culture, blood (Routine X 2) w Reflex to ID Panel     Status: None (Preliminary result)   Collection Time: 05/17/20  6:04 AM   Specimen: BLOOD LEFT HAND  Result Value Ref Range Status   Specimen Description BLOOD LEFT HAND  Final   Special Requests   Final    BOTTLES DRAWN AEROBIC AND ANAEROBIC Blood Culture adequate volume   Culture   Final    NO GROWTH 4 DAYS Performed at [facility], [place] [address] [place], [place] [address]    Report Status PENDING  Incomplete   Assessment/Plan: S/P Procedure(s) (LRB): REMOVAL PORT-A-CATH (Right)  POD#6  1 conts to do well 2 afeb, VSS, sinus rhythm, some mild Htn, minor tachy at times 3 sats good on RA 4 no  new labs today 5 for PICC line placement 6 conts rocephin per ID 7 TOC consult for SNF placement , has no help at home, needs daily IV abx for total of 6 weeks   LOS: 8 days    John Giovanni PA-C Pager [phone] 05/21/2020   I have seen and examined the patient and agree with the assessment and plan as outlined.  Ready for hospital D/C.  Awaiting PICC line and placement.   Rexene Alberts, MD 05/21/2020 8:35 AM

## 2020-05-21 NOTE — Progress Notes (Signed)
[facility] for Infectious Disease  Date of Admission:  05/13/2020     Total days of antibiotics 9         ASSESSMENT:  Matthew Serrano is POD #1 from [facility] removal and continues to do well. Blood cultures from 9/24 and 9/25 have remained without growth to date. PICC line now in place. Discussed plan of care for 6 weeks of Ceftriaxone through 11/4 for Streptococcus anginosis endocarditis. Disposition likely to go to rehabilitation first. OPAT orders placed and will arrange follow up in ID office.   PLAN:  1. Continue ceftriaxone through 11/4.  2. OPAT / [place] below.  3. Follow up in ID clinic.   Diagnosis: Streptococcus anginosis bacteremia and endocarditis  Culture Result: Streptococcus anginosis.   No Known Allergies  OPAT Orders Discharge antibiotics to be given via PICC line Discharge antibiotics: Ceftriaxone.  Per pharmacy protocol   Duration: 6 weeks End Date: 06/26/20   [facility] Care Per Protocol:  Home health RN for IV administration and teaching; PICC line care and labs.    Labs weekly while on IV antibiotics: _X_ CBC with differential __ BMP _X_ CMP _X_ CRP _X_ ESR __ Vancomycin trough __ CK  _X_ Please pull PIC at completion of IV antibiotics __ Please leave PIC in place until doctor has seen patient or been notified  Fax weekly labs to [phone]  Clinic Follow Up Appt:  10/28 at 9:30am with Dr. 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 aspirin EC  325 mg Oral Daily  . bisacodyl  10 mg Oral Daily   Or  . bisacodyl  10 mg Rectal Daily  . carvedilol  25 mg Oral BID WC  . chlorhexidine  15 mL Mouth/Throat BID  . Chlorhexidine Gluconate Cloth  6 each Topical Daily  . [place] [facility], Patient & Family Education   Does not apply Once  . docusate sodium  200 mg Oral Daily  . doxazosin  8 mg Oral QHS  . enoxaparin (LOVENOX) injection  30 mg Subcutaneous QHS  . furosemide  40 mg Oral BID  . mouth rinse  15 mL Mouth Rinse BID  . pantoprazole  40 mg Oral Daily  . sodium chloride flush  10-40 mL Intracatheter Q12H  . sodium chloride flush  10-40 mL Intracatheter Q12H  . sodium chloride flush  3 mL Intravenous Q12H    SUBJECTIVE:  Afebrile overnight with no acute events. Feeling okay today with no complaints. Has been up and walking with physical therapy.   No Known Allergies   Review of Systems: Review of Systems  Constitutional: Negative for chills, fever and weight loss.  Respiratory: Negative for cough, shortness of breath and wheezing.   Cardiovascular: Negative for chest pain and leg swelling.  Gastrointestinal: Negative for abdominal pain, constipation, diarrhea, nausea and vomiting.  Skin: Negative for rash.  OBJECTIVE: Vitals:   05/21/20 0546 05/21/20 0547 05/21/20 0803 05/21/20 1152  BP: 126/86 (!) 141/93 134/84 120/76  Pulse: (!) 103 (!) 103 100 89  Resp: (!) _0 Temp: 98.7 F (37.1 C) 98.7 F (37.1 C) 98.7 F (37.1 C) 98.9 F (37.2 C)  TempSrc: Oral Oral Oral Oral  SpO2: 97% 97% 97% 95%  Weight:      Height:       Body mass index is 34.63 kg/m.  Physical Exam Constitutional:      General: He is not in acute distress.    Appearance: He is well-developed.     Comments: Seated in the chair; pleasant.   Cardiovascular:     Rate and Rhythm: Normal rate and regular rhythm.     Heart sounds:  Normal heart sounds.     Comments: Surgical sites are well approximated, clean and dry. PICC line in right upper extremity.  Pulmonary:     Effort: Pulmonary effort is normal.     Breath sounds: Normal breath sounds.  Skin:    General: Skin is warm and dry.  Neurological:     Mental Status: He is alert and oriented to person, place, and time.  Psychiatric:        Behavior: Behavior normal.        Thought Content: Thought content normal.        Judgment: Judgment normal.     Lab Results Lab Results  Component Value Date   WBC 11.3 (H) 05/20/2020   HGB 11.3 (L) 05/20/2020   HCT 35.0 (L) 05/20/2020   MCV 88.8 05/20/2020   PLT 274 05/20/2020    Lab Results  Component Value Date   CREATININE 0.93 05/20/2020   BUN 8 05/20/2020   NA 141 05/20/2020   K 4.3 05/20/2020   CL 109 05/20/2020   CO2 24 05/20/2020    Lab Results  Component Value Date   ALT 30 05/14/2020   AST 28 05/14/2020   ALKPHOS 78 05/14/2020   BILITOT 1.0 05/14/2020     Microbiology: Recent Results (from the past 240 hour(s))  Blood Culture (routine x 2)     Status: Abnormal   Collection Time: 05/13/20 12:04 PM   Specimen: BLOOD  Result Value Ref Range Status   Specimen Description   Final    BLOOD RIGHT ANTECUBITAL Performed at [facility], [place] [address], [place], [place] [address]    Special Requests   Final    BOTTLES DRAWN AEROBIC AND ANAEROBIC Blood Culture results may not be optimal due to an excessive volume of blood received in culture bottles Performed at [place] [address], [place], [place] [address]    Culture  Setup Time   Final    GRAM POSITIVE COCCI IN CHAINS IN BOTH AEROBIC AND ANAEROBIC BOTTLES CRITICAL RESULT CALLED TO, READ BACK BY AND VERIFIED WITH: J. LEDFORD,PHARMD 0602 05/15/2020 T. TYSOR    Culture (A)  Final    STREPTOCOCCUS ANGINOSIS SUSCEPTIBILITIES PERFORMED ON PREVIOUS CULTURE WITHIN THE LAST 5 DAYS. Performed at [facility], [place] [address], [place], [place] [address]    Report Status 05/17/2020 FINAL  Final  Blood Culture (routine x 2)     Status: Abnormal   Collection Time: 05/13/20 12:04 PM   Specimen: BLOOD  Result Value Ref Range Status   Specimen Description   Final    BLOOD LEFT ANTECUBITAL Performed at [facility], [address] [place], [place] [address]    Special Requests   Final    BOTTLES DRAWN AEROBIC AND ANAEROBIC Blood Culture adequate volume Performed at [facility] [address] [place], [place] [address]    Culture  Setup Time   Final    GRAM POSITIVE COCCI IN CHAINS IN BOTH AEROBIC AND ANAEROBIC BOTTLES CRITICAL RESULT CALLED TO, READ BACK BY AND VERIFIED WITH: J. LEDFORD,PHARMD 0602 05/15/2020 T. TYSOR    Culture (A)  Final    STREPTOCOCCUS ANGINOSIS SUSCEPTIBILITIES PERFORMED ON PREVIOUS CULTURE WITHIN THE LAST 5 DAYS. Performed at [facility], [place] [address], [place], [place] [address]    Report Status 05/17/2020 FINAL  Final  SARS Coronavirus 2 by RT PCR (hospital order, performed in [facility] hospital lab) Nasopharyngeal Nasopharyngeal Swab     Status: None   Collection Time: 05/13/20 12:20 PM   Specimen: Nasopharyngeal Swab  Result Value Ref Range Status   SARS Coronavirus 2 NEGATIVE NEGATIVE Final    Comment: (NOTE) SARS-CoV-2 target nucleic acids are NOT DETECTED.  The SARS-CoV-2 RNA is generally detectable in upper and lower respiratory specimens during the acute phase of infection. The lowest concentration of SARS-CoV-2 viral copies this assay can detect is 250 copies / mL. A negative result does not preclude SARS-CoV-2 infection and should not be used as the sole basis for treatment or other patient management decisions.  A negative result may occur with improper specimen collection / handling, submission of specimen other than nasopharyngeal swab, presence of viral mutation(s) within the areas  targeted by this assay, and inadequate number of viral copies (<250 copies / mL). A negative result must be combined with clinical observations, patient history, and epidemiological information.  Fact Sheet for Patients:   StrictlyIdeas.no  Fact Sheet for Healthcare Providers: BankingDealers.co.za  This test is not yet approved or  cleared by the [place] FDA and has been authorized for detection and/or diagnosis of SARS-CoV-2 by FDA under an Emergency Use Authorization (EUA).  This EUA will remain in effect (meaning this test can be used) for the duration of the COVID-19 declaration under Section 564(b)(1) of the Act, 21 U.S.C. section 360bbb-3(b)(1), unless the authorization is terminated or revoked sooner.  Performed at [facility], [place] [address] [place], [place] [address]   Urine culture     Status: None   Collection Time: 05/13/20  1:10 PM   Specimen: In/Out Cath Urine  Result Value Ref Range Status   Specimen Description   Final    IN/OUT CATH URINE Performed at [place] [address], [place], [place] [address]    Special Requests   Final    NONE Performed at [facility], [place] [address], [place], [place] [address]    Culture   Final    NO GROWTH Performed at [facility], [place] [address], [place],  [address]    Report Status 05/14/2020 FINAL  Final  MRSA PCR Screening     Status: None   Collection Time: 05/14/20  3:56 PM   Specimen: Nasal Mucosa; Nasopharyngeal  Result Value Ref Range Status   MRSA by PCR NEGATIVE NEGATIVE Final    Comment:        The GeneXpert MRSA Assay (FDA approved for NASAL specimens only), is one component of a comprehensive MRSA colonization surveillance program. It is not intended to diagnose MRSA infection nor to guide or monitor treatment for MRSA infections.  Performed at [facility], [place] [address], [place], [place] [address]   Surgical pcr screen     Status: None   Collection Time: 05/14/20 10:33 PM   Specimen: Nasal Mucosa; Nasal Swab  Result Value Ref Range Status   MRSA, PCR NEGATIVE NEGATIVE Final   Staphylococcus aureus NEGATIVE NEGATIVE Final    Comment: (NOTE) The Xpert SA Assay (FDA approved for NASAL specimens in patients 70 years of age and older), is one component of a comprehensive surveillance program. It is not intended to diagnose infection nor to guide or monitor treatment. Performed at [facility], [place] [address] [place], [place] [address]   Aerobic/Anaerobic Culture (surgical/deep wound)     Status: None   Collection Time: 05/15/20 10:22 AM   Specimen: Tissue  Result Value Ref Range Status   Specimen Description TISSUE AORTIC VALVE VEGETATION  Final   Special Requests A  Final   Gram Stain   Final    FEW WBC PRESENT,BOTH PMN AND MONONUCLEAR FEW GRAM NEGATIVE RODS RARE GRAM POSITIVE COCCI IN PAIRS IN CLUSTERS    Culture   Final    FEW STREPTOCOCCUS ANGINOSIS GRAM NEGATIVE RODS ORGANISM NOT VIABLE CRITICAL RESULT CALLED TO, READ BACK BY AND VERIFIED WITH: Chanetta [place] [id number] AT 1112 BY CM Performed at [facility], [place] [address], [place], [place] [address]    Report Status 05/20/2020 FINAL  Final   Organism ID, Bacteria STREPTOCOCCUS ANGINOSIS  Final      Susceptibility   Streptococcus anginosis - MIC*    PENICILLIN <=0.06 SENSITIVE Sensitive     CEFTRIAXONE <=0.12 SENSITIVE Sensitive     ERYTHROMYCIN <=0.12 SENSITIVE Sensitive     LEVOFLOXACIN 1 SENSITIVE Sensitive     VANCOMYCIN 0.5 SENSITIVE Sensitive     * FEW STREPTOCOCCUS ANGINOSIS  Culture, blood (routine x 2)     Status: None   Collection Time: 05/16/20 12:14 PM   Specimen: BLOOD  Result Value Ref Range Status   Specimen Description BLOOD LEFT ANTECUBITAL  Final   Special Requests   Final    BOTTLES DRAWN AEROBIC AND ANAEROBIC Blood Culture results may not be  optimal due to an inadequate volume of blood received in culture bottles   Culture   Final    NO GROWTH 5 DAYS Performed at [facility], [place] [address] [place], [place] [address]    Report Status 05/21/2020 FINAL  Final  Culture, blood (routine x 2)     Status: None   Collection Time: 05/16/20 12:14 PM   Specimen: BLOOD  Result Value Ref Range Status   Specimen Description BLOOD LEFT ANTECUBITAL  Final   Special Requests   Final    BOTTLES DRAWN AEROBIC ONLY Blood Culture results may not be optimal due to an inadequate volume of blood received in culture bottles   Culture   Final    NO GROWTH 5 DAYS Performed at [facility], [place] [address], [place], [place] [address]    Report Status 05/21/2020 FINAL  Final  Culture, blood (Routine X 2) w Reflex to ID Panel     Status: None (Preliminary result)   Collection Time: 05/17/20  6:00 AM   Specimen: BLOOD  Result Value Ref Range Status   Specimen Description BLOOD LEFT ANTECUBITAL  Final   Special Requests   Final    BOTTLES DRAWN AEROBIC AND ANAEROBIC Blood Culture adequate volume   Culture   Final  NO GROWTH 4 DAYS Performed at [facility], [place] [address], [place], [place] [address]    Report Status PENDING  Incomplete  Culture, blood (Routine X 2) w Reflex to ID Panel     Status: None (Preliminary result)   Collection Time: 05/17/20  6:04 AM   Specimen: BLOOD LEFT HAND  Result Value Ref Range Status   Specimen Description BLOOD LEFT HAND  Final   Special Requests   Final    BOTTLES DRAWN AEROBIC AND ANAEROBIC Blood Culture adequate volume   Culture   Final    NO GROWTH 4 DAYS Performed at [facility], [place] [address] [place], [place] [address]    Report Status PENDING  Incomplete     Terri Piedra, [place] [facility] [place] [facility]  05/21/2020  1:24 PM

## 2020-05-21 NOTE — NC FL2 (Signed)
[place] LEVEL OF CARE SCREENING TOOL     IDENTIFICATION  Patient Name: Matthew Serrano. Birthdate: [birth year] Sex: male Admission Date (Current Location): 05/13/2020  [facility] and [place] Number:  Herbalist and Address:  [facility] [place], [place] [address], [place], [place] [address]      Provider Number: [id number]  Attending Physician Name and Address:  Rexene Alberts, MD  Relative Name and Phone Number:       Current Level of Care: Hospital Recommended Level of Care: [place] Prior Approval Number:    Date Approved/Denied:   PASRR Number: [id number]  Discharge Plan: SNF    Current Diagnoses: Patient Active Problem List   Diagnosis Date Noted  . Streptococcal endocarditis 05/21/2020  . 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  . 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 History of GI bleed 05/14/2020  . Normocytic anemia 05/14/2020  . Endocarditis 05/14/2020  . Abscess of aortic valve 05/14/2020  . Fistula between aorta and pulmonary artery ([place]) 05/14/2020  . Streptococcal bacteremia 05/13/2020  . Acute CHF (congestive heart failure) ([place]) 05/13/2020  . Respiratory distress 05/13/2020  . Aortic valve endocarditis 05/13/2020  . Severe aortic insufficiency 05/13/2020  . Acute blood loss anemia 05/02/2020  . Malignant neoplasm of prostate ([place]) 06/23/2018  . Goals of care, counseling/discussion 06/23/2018  . Aortic stenosis   . Left inguinal hernia 09/04/2015  . Alcoholism ([place]) 06/10/2014  . Anemia of chronic disease 05/23/2014  . Elevated PSA 01/20/2012  . HEMORRHAGE OF RECTUM AND ANUS 12/15/2008  . ERECTILE DYSFUNCTION, MILD 12/14/2007  . SEBORRHEIC KERATOSIS, INFLAMED 08/03/2007  . NEOPLASM, SKIN, UNCERTAIN BEHAVIOR 42/70/6237  . Hypothyroidism 07/06/2007  . Diabetes mellitus ([place]) 07/06/2007  . Hyperlipidemia 07/06/2007  . Essential hypertension 07/06/2007     Orientation RESPIRATION BLADDER Height & Weight     Self, Time, Situation  Normal (please see discharge summary) Continent Weight: 221 lb 1.9 oz (100.3 kg) Height:  5\' 7"  (170.2 cm)  BEHAVIORAL SYMPTOMS/MOOD NEUROLOGICAL BOWEL NUTRITION STATUS      Continent Diet  AMBULATORY STATUS COMMUNICATION OF NEEDS Skin     Verbally Surgical wounds (closed incision chest, closed incision rt groin,closed incision rt chect, wound incision,skin tear rt back lateral)                       Personal Care Assistance Level of Assistance  Bathing, Dressing, Feeding Bathing Assistance: Limited assistance Feeding assistance: Independent Dressing Assistance: Limited assistance     Functional Limitations Info  Sight, Hearing, Speech Sight Info: Adequate Hearing Info: Adequate Speech Info: Adequate    SPECIAL CARE FACTORS FREQUENCY                       Contractures Contractures Info: Not present    Additional Factors Info  Code Status, Allergies Code Status Info: FULL Allergies Info: NKA           Current Medications (05/21/2020):  This is the current hospital active medication list Current Facility-Administered Medications  Medication Dose Route Frequency Provider Last Rate Last Admin  . 0.9 %  sodium chloride infusion  250 mL Intravenous Continuous Rexene Alberts, MD   Stopped at 05/16/20 [phone]  . 0.9 %  sodium chloride infusion   Intravenous PRN Rexene Alberts, MD 10 mL/hr at 05/18/20 1400 Rate Verify at 05/18/20 1400  . aspirin EC tablet 325 mg  325  mg Oral Daily Rexene Alberts, MD   325 mg at 05/21/20 0817  . bisacodyl (DULCOLAX) EC tablet 10 mg  10 mg Oral Daily Rexene Alberts, MD   10 mg at 05/18/20 7209   Or  . bisacodyl (DULCOLAX) suppository 10 mg  10 mg Rectal Daily Rexene Alberts, MD      . carvedilol (COREG) tablet 25 mg  25 mg Oral BID WC Turner, Traci R, MD      . cefTRIAXone (ROCEPHIN) 2 g in sodium chloride 0.9 % 100 mL IVPB  2 g Intravenous Q24H  Rexene Alberts, MD 200 mL/hr at 05/21/20 1147 2 g at 05/21/20 1147  . chlorhexidine (PERIDEX) 0.12 % solution 15 mL  15 mL Mouth/Throat BID Rexene Alberts, MD   15 mL at 05/21/20 0817  . Chlorhexidine Gluconate Cloth 2 % PADS 6 each  6 each Topical Daily Rexene Alberts, MD   6 each at 05/21/20 1114  . [place] [facility], Patient & Family Education   Does not apply Once Rexene Alberts, MD      . dextrose 50 % solution 0-50 mL  0-50 mL Intravenous PRN Rexene Alberts, MD      . docusate sodium (COLACE) capsule 200 mg  200 mg Oral Daily Rexene Alberts, MD   200 mg at 05/21/20 0817  . doxazosin (CARDURA) tablet 8 mg  8 mg Oral QHS Rexene Alberts, MD   8 mg at 05/20/20 2308  . enoxaparin (LOVENOX) injection 30 mg  30 mg Subcutaneous QHS Rexene Alberts, MD   30 mg at 05/20/20 2308  . furosemide (LASIX) tablet 40 mg  40 mg Oral BID Sueanne Margarita, MD      . lactated ringers infusion   Intravenous Continuous Rexene Alberts, MD 10 mL/hr at 05/20/20 4709 Continued from Pre-op at 05/20/20 0721  . MEDLINE mouth rinse  15 mL Mouth Rinse BID Rexene Alberts, MD   15 mL at 05/20/20 1103  . metoprolol tartrate (LOPRESSOR) injection 2.5-5 mg  2.5-5 mg Intravenous Q2H PRN Rexene Alberts, MD      . ondansetron [facility]) injection 4 mg  4 mg Intravenous Q6H PRN Rexene Alberts, MD   4 mg at 05/16/20 0121  . oxyCODONE (Oxy IR/ROXICODONE) immediate release tablet 5-10 mg  5-10 mg Oral Q3H PRN Rexene Alberts, MD   5 mg at 05/16/20 0419  . pantoprazole (PROTONIX) EC tablet 40 mg  40 mg Oral Daily Rexene Alberts, MD   40 mg at 05/21/20 0819  . sodium chloride flush (NS) 0.9 % injection 10-40 mL  10-40 mL Intracatheter Q12H Rexene Alberts, MD   10 mL at 05/19/20 1248  . sodium chloride flush (NS) 0.9 % injection 10-40 mL  10-40 mL Intracatheter PRN Rexene Alberts, MD      . sodium chloride flush (NS) 0.9 % injection 10-40 mL  10-40 mL Intracatheter Q12H Rexene Alberts, MD      .  sodium chloride flush (NS) 0.9 % injection 10-40 mL  10-40 mL Intracatheter PRN Rexene Alberts, MD      . sodium chloride flush (NS) 0.9 % injection 3 mL  3 mL Intravenous Q12H Rexene Alberts, MD   3 mL at 05/21/20 0836  . sodium chloride flush (NS) 0.9 % injection 3 mL  3 mL Intravenous PRN Rexene Alberts, MD   3 mL at 05/20/20 2309  .  traMADol (ULTRAM) tablet 50-100 mg  50-100 mg Oral Q4H PRN Rexene Alberts, MD   100 mg at 05/21/20 0820     Discharge Medications: Please see discharge summary for a list of discharge medications.  Relevant Imaging Results:  Relevant Lab Results:   Additional Information SSN [id number]  Vinie Sill, [place]

## 2020-05-21 NOTE — Progress Notes (Signed)
PHARMACY CONSULT NOTE FOR:  OUTPATIENT  PARENTERAL ANTIBIOTIC THERAPY (OPAT)  Indication: strep aginosis endocarditis Regimen: ceftriaxone 2g IV every 24 hours End date: 06/26/2020  IV antibiotic discharge orders are pended. To discharging provider:  please sign these orders via discharge navigator,  Select New Orders & click on the button choice - Manage This Unsigned Work.     Thank you for allowing pharmacy to be a part of this patient's care.  Romilda Garret, PharmD PGY1 Acute Care Pharmacy Resident 05/21/2020 12:52 PM  Please check [email or web address] for unit specific pharmacy phone numbers.

## 2020-05-21 NOTE — TOC Initial Note (Signed)
Transition of Care (TOC) - Initial/Assessment Note    Patient Details  Name: Matthew Serrano. MRN: [id number] Date of Birth: 10-13-1945  Transition of Care [facility]) CM/SW Contact:    Vinie Sill, [place] Phone Number: 05/21/2020, 4:11 PM  Clinical Narrative:                  CSW visit with patient at bedside. CSW introduced self and explained role. CSW discussed with patient PT recommendations of short term rehab at [facility]. Patient states he lives home alone and does not have 24/7 assistance. He has never been to SNF and states no SNF preference. CSW explained the SNF process. CSW was given the permission to send SNF referrals. Patient received covid vaccines.  CSW will provide bed offers once available.  covid test requested. CSW will continue to follow and assist with discharge planning.   Thurmond Butts, MSW, [place] Clinical Social Worker    Expected Discharge Plan: Skilled Nursing Facility Barriers to Discharge: SNF Pending bed offer   Patient Goals and CMS Choice        Expected Discharge Plan and Services Expected Discharge Plan: [place] In-house Referral: Clinical Social Work       Expected Discharge Date:  (unknown)                                    Prior Living Arrangements/Services   Lives with:: Self Patient language and need for interpreter reviewed:: No        Need for Family Participation in Patient Care: Yes (Comment) Care giver support system in place?: Yes (comment)   Criminal Activity/Legal Involvement Pertinent to Current Situation/Hospitalization: No - Comment as needed  Activities of Daily Living Home Assistive Devices/Equipment: Eyeglasses, Dentures (specify type), CBG Meter (upper/lower partial plates) ADL Screening (condition at time of admission) Patient's cognitive ability adequate to safely complete daily activities?: Yes Is the patient deaf or have difficulty hearing?: No Does the patient have difficulty  seeing, even when wearing glasses/contacts?: No Does the patient have difficulty concentrating, remembering, or making decisions?: No Patient able to express need for assistance with ADLs?: Yes Does the patient have difficulty dressing or bathing?: No Independently performs ADLs?: No Communication: Independent Dressing (OT): Independent Grooming: Independent Feeding: Independent Bathing: Independent Toileting: Needs assistance Is this a change from baseline?: Change from baseline, expected to last >3days In/Out Bed: Needs assistance Is this a change from baseline?: Change from baseline, expected to last >3 days Walks in Home: Needs assistance Is this a change from baseline?: Change from baseline, expected to last >3 days Does the patient have difficulty walking or climbing stairs?: Yes (secondary to weakness) Weakness of Legs: Both Weakness of Arms/Hands: None  Permission Sought/Granted Permission sought to share information with : Family Supports Permission granted to share information with : Yes, Verbal Permission Granted  Share Information with NAME: Matthew Serrano  Permission granted to share info w AGENCY: SNFs  Permission granted to share info w Relationship: daughter  Permission granted to share info w Contact Information: [phone]  Emotional Assessment Appearance:: Appears stated age Attitude/Demeanor/Rapport: Engaged Affect (typically observed): Accepting, Appropriate, Pleasant Orientation: : Oriented to Self, Oriented to Place, Oriented to  Time, Oriented to Situation Alcohol / Substance Use: Not Applicable Psych Involvement: No (comment)  Admission diagnosis:  Bacteremia [R78.81] Endocarditis [I38] S/P aortic valve allograft [Z95.4] Patient Active Problem List   Diagnosis Date Noted  .  Streptococcal endocarditis 05/21/2020  . 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  . 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 History of GI bleed 05/14/2020   . Normocytic anemia 05/14/2020  . Endocarditis 05/14/2020  . Abscess of aortic valve 05/14/2020  . 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 Streptococcal bacteremia 05/13/2020  . Acute CHF (congestive heart failure) ([place]) 05/13/2020  . Respiratory distress 05/13/2020  . Aortic valve endocarditis 05/13/2020  . Severe aortic insufficiency 05/13/2020  . Acute blood loss anemia 05/02/2020  . Malignant neoplasm of prostate ([place]) 06/23/2018  . Goals of care, counseling/discussion 06/23/2018  . Aortic stenosis   . Left inguinal hernia 09/04/2015  . Alcoholism ([place]) 06/10/2014  . Anemia of chronic disease 05/23/2014  . Elevated PSA 01/20/2012  . HEMORRHAGE OF RECTUM AND ANUS 12/15/2008  . ERECTILE DYSFUNCTION, MILD 12/14/2007  . SEBORRHEIC KERATOSIS, INFLAMED 08/03/2007  . NEOPLASM, SKIN, UNCERTAIN BEHAVIOR 98/72/1587  . Hypothyroidism 07/06/2007  . Diabetes mellitus ([place]) 07/06/2007  . Hyperlipidemia 07/06/2007  . Essential hypertension 07/06/2007   PCP:  [name], NP Pharmacy:   [facility] [place], [place] [address] Phone: [phone] Fax: [phone]  [id number] [address], [address] [address] Phone: [phone] Fax: [phone]     Social Determinants of Health (SDOH) Interventions    Readmission Risk Interventions No flowsheet data found.

## 2020-05-22 ENCOUNTER — Other Ambulatory Visit: Payer: Self-pay | Admitting: Medical

## 2020-05-22 ENCOUNTER — Telehealth: Payer: Self-pay | Admitting: *Deleted

## 2020-05-22 DIAGNOSIS — I5031 Acute diastolic (congestive) heart failure: Secondary | ICD-10-CM

## 2020-05-22 DIAGNOSIS — I359 Nonrheumatic aortic valve disorder, unspecified: Secondary | ICD-10-CM

## 2020-05-22 LAB — CULTURE, BLOOD (ROUTINE X 2)
Culture: NO GROWTH
Culture: NO GROWTH
Special Requests: ADEQUATE
Special Requests: ADEQUATE

## 2020-05-22 NOTE — Progress Notes (Addendum)
[address]       [place], [address]             [phone]      2 Days Post-Op Procedure(s) (LRB): REMOVAL PORT-A-CATH (Right) Subjective: Feel well   Objective: Vital signs in last 24 hours: Temp:  [98.3 F (36.8 C)-99.1 F (37.3 C)] 99.1 F (37.3 C) (09/30 0459) Pulse Rate:  [80-100] 80 (09/30 0459) Cardiac Rhythm: Normal sinus rhythm (09/29 1900) Resp:  [11-29] 29 (09/30 0648) BP: (120-139)/(75-94) 130/90 (09/30 0459) SpO2:  [95 %-98 %] 97 % (09/30 0459) Weight:  [100.1 kg] 100.1 kg (09/30 0648)  Hemodynamic parameters for last 24 hours:    Intake/Output from previous day: 09/29 0701 - 09/30 0700 In: 480 [P.O.:480] Out: 1920 [Urine:1920] Intake/Output this shift: Total I/O In: -  Out: 420 [Urine:420]  General appearance: alert, cooperative and no distress Heart: regular rate and rhythm Lungs: mildly dim in bases Abdomen: benign Extremities: minor edema Wound: incis healing well  Lab Results: Recent Labs    05/20/20 0328  WBC 11.3*  HGB 11.3*  HCT 35.0*  PLT 274   BMET:  Recent Labs    05/20/20 0328  NA 141  K 4.3  CL 109  CO2 24  GLUCOSE 134*  BUN 8  CREATININE 0.93  CALCIUM 7.2*    PT/INR: No results for input(s): LABPROT, INR in the last 72 hours. ABG    Component Value Date/Time   PHART 7.431 05/16/2020 0457   HCO3 23.2 05/16/2020 0457   TCO2 24 05/16/2020 0457   ACIDBASEDEF 1.0 05/16/2020 0457   O2SAT 92.0 05/16/2020 0457   CBG (last 3)  Recent Labs    05/19/20 1141 05/20/20 0813  GLUCAP 159* 109*    Meds Scheduled Meds: . aspirin EC  325 mg Oral Daily  . bisacodyl  10 mg Oral Daily   Or  . bisacodyl  10 mg Rectal Daily  . carvedilol  25 mg Oral BID WC  . chlorhexidine  15 mL Mouth/Throat BID  . Chlorhexidine Gluconate Cloth  6 each Topical Daily  . [place] [facility], Patient & Family Education   Does not apply Once  . docusate sodium  200 mg Oral Daily  . doxazosin  8 mg Oral  QHS  . enoxaparin (LOVENOX) injection  30 mg Subcutaneous QHS  . furosemide  40 mg Oral BID  . mouth rinse  15 mL Mouth Rinse BID  . pantoprazole  40 mg Oral Daily  . sodium chloride flush  10-40 mL Intracatheter Q12H  . sodium chloride flush  10-40 mL Intracatheter Q12H  . sodium chloride flush  3 mL Intravenous Q12H   Continuous Infusions: . sodium chloride Stopped (05/16/20 0950)  . sodium chloride 10 mL/hr at 05/18/20 1400  . cefTRIAXone (ROCEPHIN)  IV 2 g (05/21/20 1147)  . lactated ringers 10 mL/hr at 05/20/20 0721   PRN Meds:.sodium chloride, dextrose, metoprolol tartrate, ondansetron (ZOFRAN) IV, oxyCODONE, sodium chloride flush, sodium chloride flush, sodium chloride flush, traMADol  Xrays [place] EKG SITE RITE  Result Date: 05/20/2020 If Site Rite image not attached, placement could not be confirmed due to current cardiac rhythm.   Assessment/Plan: S/P Procedure(s) (LRB): REMOVAL PORT-A-CATH (Right)  1 doing well 2 afeb, VSS, SR some sinus tachy overnight, coreg increased yesterday, BP cpntrol is good 3 sats good on RA 4 PICC in place 5 SW working on SNF placement 6 lasix BID yesterday, edema is better, may  be able to decrease soon     LOS: 9 days    John Giovanni PA-C Pager [phone] 05/22/2020   I have seen and examined the patient and agree with the assessment and plan as outlined.  Diuresing some but weight still 4-5 kg > baseline.  Continue lasix bid for now.  Continue carvedilol 25 bid for now but may need to increase further.  Ready for d/c to SNF once arrangements in place.  Rexene Alberts, MD 05/22/2020 8:28 AM

## 2020-05-22 NOTE — Progress Notes (Signed)
Progress Note  Patient Name: Matthew Serrano. Date of Encounter: 05/22/2020  [facility] Cardiologist: [name], MD   Subjective   Doing well today.  Denies any chest pain or SOB.  LE edema improved with diuresis.  He put out 1.9L yesterday and is net neg 10.1L.    Inpatient Medications    Scheduled Meds: . aspirin EC  325 mg Oral Daily  . bisacodyl  10 mg Oral Daily   Or  . bisacodyl  10 mg Rectal Daily  . carvedilol  25 mg Oral BID WC  . chlorhexidine  15 mL Mouth/Throat BID  . Chlorhexidine Gluconate Cloth  6 each Topical Daily  . [place] [facility], Patient & Family Education   Does not apply Once  . docusate sodium  200 mg Oral Daily  . doxazosin  8 mg Oral QHS  . enoxaparin (LOVENOX) injection  30 mg Subcutaneous QHS  . furosemide  40 mg Oral BID  . mouth rinse  15 mL Mouth Rinse BID  . pantoprazole  40 mg Oral Daily  . sodium chloride flush  10-40 mL Intracatheter Q12H  . sodium chloride flush  10-40 mL Intracatheter Q12H  . sodium chloride flush  3 mL Intravenous Q12H   Continuous Infusions: . sodium chloride Stopped (05/16/20 0950)  . sodium chloride 10 mL/hr at 05/18/20 1400  . cefTRIAXone (ROCEPHIN)  IV 2 g (05/21/20 1147)  . lactated ringers 10 mL/hr at 05/20/20 0721   PRN Meds: sodium chloride, dextrose, metoprolol tartrate, ondansetron (ZOFRAN) IV, oxyCODONE, sodium chloride flush, sodium chloride flush, sodium chloride flush, traMADol   Vital Signs    Vitals:   05/21/20 1640 05/21/20 2036 05/22/20 0459 05/22/20 0648  BP: (!) 139/94 125/75 130/90   Pulse: 87 99 80   Resp: 20 20 11  (!) 29  Temp: 98.4 F (36.9 C) 98.3 F (36.8 C) 99.1 F (37.3 C)   TempSrc: Oral Oral Oral   SpO2: 98% 98% 97%   Weight:    100.1 kg  Height:        Intake/Output Summary (Last 24 hours) at 05/22/2020 0813 Last data filed at 05/22/2020 0505 Gross per 24 hour  Intake 240 ml  Output 1520 ml  Net -1280 ml   Last 3 Weights 05/22/2020 05/21/2020  05/20/2020  Weight (lbs) 220 lb 9.6 oz 221 lb 1.9 oz 219 lb 12.8 oz  Weight (kg) 100.064 kg 100.3 kg 99.701 kg      Telemetry    NSR - Personally Reviewed  ECG    No new EKG to review - Personally Reviewed  Physical Exam  GEN: Well nourished, well developed in no acute distress HEENT: Normal NECK: No JVD; No carotid bruits LYMPHATICS: No lymphadenopathy CARDIAC:RRR, no murmurs, rubs, gallops RESPIRATORY:  Clear to auscultation without rales, wheezing or rhonchi  ABDOMEN: Soft, non-tender, non-distended MUSCULOSKELETAL: 1+ edema; No deformity  SKIN: Warm and dry NEUROLOGIC:  Alert and oriented x 3 PSYCHIATRIC:  Normal affect     Labs    High Sensitivity Troponin:  No results for input(s): TROPONINIHS in the last 720 hours.    Chemistry Recent Labs  Lab 05/18/20 0350 05/19/20 0318 05/20/20 0328  NA 141 143 141  K 3.5 3.1* 4.3  CL 108 110 109  CO2 24 25 24   GLUCOSE 101* 99 134*  BUN 7* 8 8  CREATININE 0.84 0.88 0.93  CALCIUM 7.0* 7.1* 7.2*  GFRNONAA >60 >60 >60  GFRAA >60 >60 >60  ANIONGAP  9 8 8      Hematology Recent Labs  Lab 05/18/20 0350 05/19/20 0318 05/20/20 0328  WBC 11.0* 11.5* 11.3*  RBC 3.95* 4.13* 3.94*  HGB 11.0* 11.9* 11.3*  HCT 34.1* 36.2* 35.0*  MCV 86.3 87.7 88.8  MCH 27.8 28.8 28.7  MCHC 32.3 32.9 32.3  RDW 17.2* 17.2* 17.5*  PLT 210 260 274    BNPNo results for input(s): BNP, PROBNP in the last 168 hours.   DDimer No results for input(s): DDIMER in the last 168 hours.   Radiology    [place] EKG SITE RITE  Result Date: 05/20/2020 If Site Rite image not attached, placement could not be confirmed due to current cardiac rhythm.   Cardiac Studies    IMPRESSIONS    1. Left ventricular ejection fraction, by estimation, is 65 to 70%. The  left ventricle has normal function. The left ventricle has no regional  wall motion abnormalities. There is mild concentric left ventricular  hypertrophy. Left ventricular diastolic    parameters are consistent with Grade II diastolic dysfunction  (pseudonormalization). Elevated left atrial pressure.  2. Right ventricular systolic function is normal. The right ventricular  size is normal. There is mildly elevated pulmonary artery systolic  pressure.  3. Left atrial size was mildly dilated.  4. The mitral valve is normal in structure. Mild mitral valve  regurgitation.  5. There is suspicion for a vegetation on both the left and the right  coronary cusps and an accompanying eccentric jet of aortic insufficiency  from the base of the right-left commisure. Cannot exclude a flail segment  of the right cusp. The aortic valve  is tricuspid. There is moderate calcification of the aortic valve. There  is moderate thickening of the aortic valve. Aortic valve regurgitation is  severe. Mild to moderate aortic valve stenosis.   TEE IMPRESSIONS    1. Left ventricular ejection fraction, by estimation, is 70 to 75%. The  left ventricle has hyperdynamic function. The left ventricle has no  regional wall motion abnormalities. There is mild concentric left  ventricular hypertrophy.  2. Right ventricular systolic function is hyperdynamic. The right  ventricular size is normal.  3. Left atrial size was mildly dilated. No left atrial/left atrial  appendage thrombus was detected.  4. A small pericardial effusion is present. The pericardial effusion is  circumferential. There is no evidence of cardiac tamponade.  5. The mitral valve is normal in structure. Trivial mitral valve  regurgitation.  6. There is endocarditis of the aortic valve with a torn/flail right  coronary cusp and a periannular abscess. The right coronary cusp, which  was previously severely limited in mobility, now has broad excursions in  systole and diastole, leading to a  reduced severity of the aortic stenosis. The aortic insufficiency jet is  highly eccentric, directed posteriorly and parallel with  the plane of the  annulus. The aortic valve is tricuspid. There is moderate calcification of  the aortic valve. There is  severe thickening of the aortic valve. Aortic valve regurgitation is  severe. Mild to moderate aortic valve stenosis.  7. 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 There is a fistula  connecting the aortic root at the level of the right coronary sinus to the  supraannular pulmonary artery with a large  continuous left to right shunt.. There is mild (Grade II) plaque  involving the descending aorta and ascending aorta.   FINDINGS  Left  Ventricle: Left ventricular ejection fraction, by estimation, is 70  to 75%. The left ventricle has hyperdynamic function. The left ventricle  has no regional wall motion abnormalities. The left ventricular internal  cavity size was normal in size.  There is mild concentric left ventricular hypertrophy. Left ventricular  diastolic function could not be evaluated due to nondiagnostic images.   Right Ventricle: The right ventricular size is normal. No increase in  right ventricular wall thickness. Right ventricular systolic function is  hyperdynamic.   Left Atrium: Left atrial size was mildly dilated. No left atrial/left  atrial appendage thrombus was detected.   Patient Profile     74 y.o. 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 Additional medical issues include metastatic prostate Ca, DM, HTN, moderate nonobstructive CAD and recent GI bleed due to cecal angiodysplasia.    Assessment & Plan    1. Strep endocarditis: -pan sensitive organism, on ceftriaxone.  -Gram stain of valve specimen was positive for Gram +ve cocci.  -No  fever, minimal elevation in WBC persists.  -Unfortunately at risk for reinfection of homograft due to need for emergent surgery before blood cultures were cleared.  -PICC placement done yesterday  2. Severe AS with endocarditis -S/P AVR/root repair Homograft day # 6 -doing well -no afib or AV block on tele -PICC placed yesterday to continue IV Anbx for endocarditis  3. HTN: -BP controlled after increasing Carvedilol yesterday for elevated HR -continue Carvedilol 25mg  BID, Cardura 8mg  daily  4. RN:[id number] glucose control postop.  5. Nonobstructive CAD: -denies any angina -continue ASA, statin and BB  6.  Acute diastolic CHF -EF normal -Lasix increased yesterday to his home dose of 40mg  BID PO due to increased LE edema -SCr stable at 0.93 on 9/28>>will repeat BMET today  [facility] will sign off.   Medication Recommendations:  Carvedilol 25mg  BID, Cardura 8mg  qhs, Lasix 20mg  BID, Lipitor 40mg  daily, ASA 325mg  daily and Kdur based on today's BMET Other recommendations (labs, testing, etc):  2D echo outpt for baseline s/p AVR Follow up as an outpatient:  TOC followup 7-10 days      For questions or updates, please contact [place] Please consult [email or web address] for contact info under        Signed, Fransico Him, MD  05/22/2020, 8:13 AM

## 2020-05-22 NOTE — TOC Progression Note (Signed)
Transition of Care (TOC) - Progression Note    Patient Details  Name: Matthew Serrano. MRN: [id number] Date of Birth: 1946/02/04  Transition of Care [facility]) CM/SW [place], [place] Phone Number: 05/22/2020, 1:22 PM  Clinical Narrative:     CSW met with patient- provided bed offers. Patient requested to contact his daughter,Matthew Serrano and get her input on facility choices. CSW called patient's daughter, left voice message.   Waiting on return call from patient's daughter.  Thurmond Butts, MSW, [place] Clinical Social Worker   Expected Discharge Plan: Skilled Nursing Facility Barriers to Discharge: SNF Pending bed offer  Expected Discharge Plan and Services Expected Discharge Plan: [place] In-house Referral: Clinical Social Work       Expected Discharge Date:  (unknown)                                     Social Determinants of Health (SDOH) Interventions    Readmission Risk Interventions No flowsheet data found.

## 2020-05-22 NOTE — Telephone Encounter (Signed)
He can keep the November echo apt. Thanks.

## 2020-05-22 NOTE — Evaluation (Signed)
Occupational Therapy Evaluation Patient Details Name: Matthew Serrano. MRN: [id number] DOB: Feb 26, 1946 Today's Date: 05/22/2020    History of Present Illness Matthew Serrano. is a 74 y.o. male with a hx of severe aortic stenosis, hypertension, hyperlipidemia, diabetes mellitus, prostate cancer, recent GI bleed who is being seen today for the evaluation of subacute bacterial endocarditis. Pt underwent aortic root replacement on 9/23.    Clinical Impression   Patient present with the above diagnosis.  Decreased activity tolerance, generalized weakness, decreased dynamic balance and sternal precautions limit ADL and functional mobility independence currently.  At home he was completely independent with all self care, meds, mobility without an AD, drove and cared for all home management and meal prep.  OT to follow in the acute setting.  Plan is for [facility] rehab at a local SNF.  Session cleared with nurse due to HR 109.  HR monitored throughout session.  Ranging from 106-109.      Follow Up Recommendations  SNF;Supervision/Assistance - 24 hour    Equipment Recommendations  3 in 1 bedside commode    Recommendations for Other Services       Precautions / Restrictions Precautions Precautions: Fall;Sternal Other Brace: use of heart pillow for coughing and transfers Restrictions Weight Bearing Restrictions: Yes Other Position/Activity Restrictions: sternal precautions      Mobility Bed Mobility Overal bed mobility: Needs Assistance     Sidelying to sit: Min assist       General bed mobility comments: HOB elevated.  Cueing for rocking to gain momentum and gentle push from arms from solid surface.  Transfers Overall transfer level: Needs assistance Equipment used: Rolling walker (2 wheeled) Transfers: Sit to/from Stand Sit to Stand: Min assist              Balance Overall balance assessment: Needs assistance Sitting-balance support: Feet supported;No upper extremity  supported Sitting balance-Leahy Scale: Good     Standing balance support: Bilateral upper extremity supported Standing balance-Leahy Scale: Fair                             ADL either performed or assessed with clinical judgement   ADL Overall ADL's : Needs assistance/impaired Eating/Feeding: Independent;Sitting   Grooming: Wash/dry hands;Wash/dry face;Standing;Supervision/safety   Upper Body Bathing: Minimal assistance;Sitting   Lower Body Bathing: Moderate assistance;Sit to/from stand   Upper Body Dressing : Minimal assistance;Sitting   Lower Body Dressing: Moderate assistance;Sit to/from stand   Toilet Transfer: Min guard;RW   Toileting- Water quality scientist and Hygiene: Maximal assistance;Sit to/from stand       Functional mobility during ADLs: Min guard;Rolling walker       Vision Baseline Vision/History: No visual deficits;Wears glasses Wears Glasses: Reading only Patient Visual Report: No change from baseline       Perception     Praxis      Pertinent Vitals/Pain Pain Assessment: No/denies pain     Hand Dominance Right   Extremity/Trunk Assessment Upper Extremity Assessment Upper Extremity Assessment: Overall WFL for tasks assessed   Lower Extremity Assessment Lower Extremity Assessment: Defer to PT evaluation   Cervical / Trunk Assessment Cervical / Trunk Exceptions: sternal incision   Communication Communication Communication: No difficulties   Cognition Arousal/Alertness: Awake/alert Behavior During Therapy: WFL for tasks assessed/performed Overall Cognitive Status: Within Functional Limits for tasks assessed  Home Living Family/patient expects to be discharged to:: Private residence Living Arrangements: Alone Available Help at Discharge: Friend(s);Available PRN/intermittently Type of Home: House Home Access: Level entry     Home Layout:  Multi-level;Laundry or work area in Building surveyor of Steps: flight Alternate Level Stairs-Rails: Right Bathroom Shower/Tub: Occupational psychologist: Programmer, systems: Yes              Prior Functioning/Environment Level of Independence: Independent        Comments: driving, grocery shopped, cooked, laundry        OT Problem List: Decreased strength;Decreased activity tolerance;Impaired balance (sitting and/or standing);Decreased safety awareness;Decreased knowledge of precautions      OT Treatment/Interventions: Self-care/ADL training;Therapeutic exercise;Energy conservation;Therapeutic activities;Balance training    OT Goals(Current goals can be found in the care plan section) Acute Rehab OT Goals Patient Stated Goal: Get stronger and return home OT Goal Formulation: With patient Time For Goal Achievement: 06/06/20 Potential to Achieve Goals: Good ADL Goals Pt Will Perform Lower Body Bathing: with set-up;sit to/from stand Pt Will Perform Lower Body Dressing: with set-up;sit to/from stand Pt Will Transfer to Toilet: with modified independence;ambulating  OT Frequency: Min 2X/week   Barriers to D/C: Decreased caregiver support          Co-evaluation              AM-PAC OT "6 Clicks" Daily Activity     Outcome Measure Help from another person eating meals?: None Help from another person taking care of personal grooming?: A Little Help from another person toileting, which includes using toliet, bedpan, or urinal?: A Lot Help from another person bathing (including washing, rinsing, drying)?: A Lot Help from another person to put on and taking off regular upper body clothing?: A Little Help from another person to put on and taking off regular lower body clothing?: A Lot 6 Click Score: 16   End of Session Equipment Utilized During Treatment: Rolling walker Nurse Communication: Other (comment) (HR  status)  Activity Tolerance: Patient tolerated treatment well Patient left: in chair;with call bell/phone within reach;with chair alarm set  OT Visit Diagnosis: Unsteadiness on feet (R26.81);Muscle weakness (generalized) (M62.81)                Time: 1000-1033 OT Time Calculation (min): 33 min Charges:  OT General Charges $OT Visit: 1 Visit OT Evaluation $OT Eval High Complexity: 1 High OT Treatments $Self Care/Home Management : 8-22 mins  05/22/2020  Rich, OTR/L  Acute Rehabilitation Services  Office:  [place] 05/22/2020, 10:53 AM

## 2020-05-22 NOTE — Consult Note (Signed)
   [facility] CM Inpatient Consult   05/22/2020  Matthew Serrano [birth year] [id number]   [place] [facility] [ACO] Patient:  Medicare NextGen and  [name], [name], NP is listed as the primary care provider   Patient screened for extreme high risk score for unplanned readmission score and for less than 30 day readmision hospitalization noted.   to check if potential Cut Off Management service needs transferred from ICU to Progressive Care.  Review of patient's medical record reveals patient is being recommended for a skilled nursing facility level of care at transition.  Patient lived alone per PT and inpatient Transition of Care teams noted.   Plan:  Continue to follow progress and disposition to assess for post hospital care management needs.  If patient transitions to a [facility] affiliated SNF can have the [facility] RN PAC to follow up for transition of care needs for returning to back to community.  Please place a [facility] Care Management consult as appropriate and for questions contact:   Natividad Brood, RN BSN [facility] Liaison  [phone] business mobile phone Toll free office [phone]  Fax number: [phone] [email or web address]

## 2020-05-22 NOTE — TOC Progression Note (Signed)
Transition of Care (TOC) - Progression Note    Patient Details  Name: Matthew Serrano. MRN: [id number] Date of Birth: November 01, 1945  Transition of Care [facility]) CM/SW [place], [place] Phone Number: 05/22/2020, 4:30 PM  Clinical Narrative:     CSW spoke with patient's daughter,and was agreeable to SNF placement at [facility]   [place] confirmed bed offer tomorrow if medically stable.  Thurmond Butts, MSW, [place] Clinical Social Worker   Expected Discharge Plan: Skilled Nursing Facility Barriers to Discharge: SNF Pending bed offer  Expected Discharge Plan and Services Expected Discharge Plan: [place] In-house Referral: Clinical Social Work       Expected Discharge Date:  (unknown)                                     Social Determinants of Health (SDOH) Interventions    Readmission Risk Interventions No flowsheet data found.

## 2020-05-22 NOTE — Progress Notes (Signed)
Physical Therapy Treatment Patient Details Name: Matthew Serrano. MRN: [id number] DOB: November 26, 1945 Today's Date: 05/22/2020    History of Present Illness Matthew Serrano. is a 74 y.o. male with a hx of severe aortic stenosis, hypertension, hyperlipidemia, diabetes mellitus, prostate cancer, recent GI bleed who is being seen today for the evaluation of subacute bacterial endocarditis. Pt underwent aortic root replacement on 9/23.     PT Comments    Pt demonstrating good progress. Does need min cues for sternal precautions with transfers and cues for exercise technique.  He lives alone and would have decreased safety at home due to sternal precautions and decreased balance - recommend SNF. Cont POC    Follow Up Recommendations  SNF;Supervision/Assistance - 24 hour     Equipment Recommendations  Rolling walker with 5" wheels    Recommendations for Other Services       Precautions / Restrictions Precautions Precautions: Fall;Sternal Precaution Booklet Issued: Yes (comment) Precaution Comments: Educated on precautions Other Brace: use of heart pillow for coughing and transfers Restrictions Other Position/Activity Restrictions: sternal precautions    Mobility  Bed Mobility               General bed mobility comments: in chair  Transfers Overall transfer level: Needs assistance Equipment used: Rolling walker (2 wheeled) Transfers: Sit to/from Stand Sit to Stand: Min guard         General transfer comment: Cues for hand placement oon knees; performed x 6  Ambulation/Gait Ambulation/Gait assistance: Min guard Gait Distance (Feet): 300 Feet Assistive device: Rolling walker (2 wheeled) Gait Pattern/deviations: Step-through pattern Gait velocity: slow   General Gait Details: cues for RW proximity; see dynamic activities   Stairs             Wheelchair Mobility    Modified Rankin (Stroke Patients Only)       Balance Overall balance assessment:  Needs assistance Sitting-balance support: Feet supported;No upper extremity supported Sitting balance-Leahy Scale: Good     Standing balance support: Bilateral upper extremity supported;No upper extremity supported Standing balance-Leahy Scale: Fair Standing balance comment: used RW for ambulation but able to stand statically without support                            Cognition Arousal/Alertness: Awake/alert Behavior During Therapy: WFL for tasks assessed/performed Overall Cognitive Status: Within Functional Limits for tasks assessed                                        Exercises Other Exercises Other Exercises: 1x5 mini squats, 1x10 heel raises with RW, 1x10 standing marching w RW - all with min guard    General Comments General comments (skin integrity, edema, etc.): VSS.  Pt gave sternal precaution handout and educated on precautions.  He was able to recall not reaching behind and not pushing up prior to education.  Required cues when scooting back in chair to not use armrest.  Also, educated on home safety techniques (having someone place items in reach, using half gallons milk, using laundry pods, etc).      Pertinent Vitals/Pain Pain Assessment: No/denies pain    Home Living                      Prior Function  PT Goals (current goals can now be found in the care plan section) Acute Rehab PT Goals Patient Stated Goal: Get stronger and return home PT Goal Formulation: With patient Time For Goal Achievement: 06/04/20 Potential to Achieve Goals: Good Progress towards PT goals: Progressing toward goals    Frequency    Min 3X/week      PT Plan Current plan remains appropriate    Co-evaluation              AM-PAC PT "6 Clicks" Mobility   Outcome Measure  Help needed turning from your back to your side while in a flat bed without using bedrails?: A Little Help needed moving from lying on your back to  sitting on the side of a flat bed without using bedrails?: A Little Help needed moving to and from a bed to a chair (including a wheelchair)?: A Little Help needed standing up from a chair using your arms (e.g., wheelchair or bedside chair)?: A Little Help needed to walk in hospital room?: A Little Help needed climbing 3-5 steps with a railing? : A Little 6 Click Score: 18    End of Session Equipment Utilized During Treatment: Gait belt Activity Tolerance: Patient tolerated treatment well Patient left: in chair;with call bell/phone within reach Nurse Communication: Mobility status PT Visit Diagnosis: Unsteadiness on feet (R26.81);Muscle weakness (generalized) (M62.81);Difficulty in walking, not elsewhere classified (R26.2)     Time: 1833-5825 PT Time Calculation (min) (ACUTE ONLY): 25 min  Charges:  $Gait Training: 8-22 mins $Therapeutic Exercise: 8-22 mins                     Abran Richard, PT Acute Rehab Services Pager [phone] [facility] Rehab [place] 05/22/2020, 3:48 PM

## 2020-05-22 NOTE — Telephone Encounter (Signed)
Hey! This patient had an order placed for an echo on 05/16/20, Kathyrn Drown must have called in to schedule that appt that day with one of our schedulers because we have him down for 06/24/20 for an echo. Does this echo appt need to be pushed out until December now that we are told he needs an echo in 3 months?

## 2020-05-22 NOTE — Telephone Encounter (Signed)
-----   Message from [place], PA-C sent at 05/22/2020 11:40 AM EDT ----- Regarding: hospital follow-up Patient is s/p AVR. He needs TOC apt with Dr. Burt Knack or APP in 7-10 days. Okay to double book with Burt Knack if needed. Also needs a BMET in 1 week, orders placed. Also needs echo in 3 months, please call and schedule this.   Thanks!  Patient needs TOC phone call arranged. Thanks!

## 2020-05-22 NOTE — Progress Notes (Signed)
CARDIAC REHAB PHASE I   PRE:  Rate/Rhythm: 98 SR     MODE:  Ambulation: 360 ft   POST:  Rate/Rhythm: 105 ST  BP:  Supine:   Sitting: 113/71  Standing:    SaO2: 97%RA 1351-1422 Assisted pt from bathroom after helping him get cleaned up. Pt walked 360 ft on RA with rolling walker and asst x 1. To recliner after walk with call bell. Tolerated well.   Graylon Good, RN BSN  05/22/2020 2:18 PM   98 SR

## 2020-05-23 DIAGNOSIS — R2689 Other abnormalities of gait and mobility: Secondary | ICD-10-CM | POA: Diagnosis not present

## 2020-05-23 DIAGNOSIS — E559 Vitamin D deficiency, unspecified: Secondary | ICD-10-CM | POA: Diagnosis not present

## 2020-05-23 DIAGNOSIS — E785 Hyperlipidemia, unspecified: Secondary | ICD-10-CM | POA: Diagnosis not present

## 2020-05-23 DIAGNOSIS — E1169 Type 2 diabetes mellitus with other specified complication: Secondary | ICD-10-CM | POA: Diagnosis not present

## 2020-05-23 DIAGNOSIS — D638 Anemia in other chronic diseases classified elsewhere: Secondary | ICD-10-CM | POA: Diagnosis not present

## 2020-05-23 DIAGNOSIS — J90 Pleural effusion, not elsewhere classified: Secondary | ICD-10-CM | POA: Diagnosis not present

## 2020-05-23 DIAGNOSIS — Z7401 Bed confinement status: Secondary | ICD-10-CM | POA: Diagnosis not present

## 2020-05-23 DIAGNOSIS — I2510 Atherosclerotic heart disease of native coronary artery without angina pectoris: Secondary | ICD-10-CM | POA: Diagnosis not present

## 2020-05-23 DIAGNOSIS — I358 Other nonrheumatic aortic valve disorders: Secondary | ICD-10-CM | POA: Diagnosis not present

## 2020-05-23 DIAGNOSIS — I10 Essential (primary) hypertension: Secondary | ICD-10-CM | POA: Diagnosis not present

## 2020-05-23 DIAGNOSIS — I330 Acute and subacute infective endocarditis: Secondary | ICD-10-CM | POA: Diagnosis not present

## 2020-05-23 DIAGNOSIS — Z452 Encounter for adjustment and management of vascular access device: Secondary | ICD-10-CM | POA: Diagnosis not present

## 2020-05-23 DIAGNOSIS — R600 Localized edema: Secondary | ICD-10-CM | POA: Diagnosis not present

## 2020-05-23 DIAGNOSIS — I38 Endocarditis, valve unspecified: Secondary | ICD-10-CM | POA: Diagnosis not present

## 2020-05-23 DIAGNOSIS — I25709 Atherosclerosis of coronary artery bypass graft(s), unspecified, with unspecified angina pectoris: Secondary | ICD-10-CM | POA: Diagnosis not present

## 2020-05-23 DIAGNOSIS — B955 Unspecified streptococcus as the cause of diseases classified elsewhere: Secondary | ICD-10-CM | POA: Diagnosis not present

## 2020-05-23 DIAGNOSIS — J9811 Atelectasis: Secondary | ICD-10-CM | POA: Diagnosis not present

## 2020-05-23 DIAGNOSIS — I350 Nonrheumatic aortic (valve) stenosis: Secondary | ICD-10-CM | POA: Diagnosis not present

## 2020-05-23 DIAGNOSIS — I5031 Acute diastolic (congestive) heart failure: Secondary | ICD-10-CM | POA: Diagnosis not present

## 2020-05-23 DIAGNOSIS — M6281 Muscle weakness (generalized): Secondary | ICD-10-CM | POA: Diagnosis not present

## 2020-05-23 DIAGNOSIS — R5381 Other malaise: Secondary | ICD-10-CM | POA: Diagnosis not present

## 2020-05-23 DIAGNOSIS — I351 Nonrheumatic aortic (valve) insufficiency: Secondary | ICD-10-CM | POA: Diagnosis not present

## 2020-05-23 DIAGNOSIS — L309 Dermatitis, unspecified: Secondary | ICD-10-CM | POA: Diagnosis not present

## 2020-05-23 DIAGNOSIS — R7881 Bacteremia: Secondary | ICD-10-CM | POA: Diagnosis not present

## 2020-05-23 DIAGNOSIS — M2550 Pain in unspecified joint: Secondary | ICD-10-CM | POA: Diagnosis not present

## 2020-05-23 DIAGNOSIS — R0902 Hypoxemia: Secondary | ICD-10-CM | POA: Diagnosis not present

## 2020-05-23 DIAGNOSIS — K089 Disorder of teeth and supporting structures, unspecified: Secondary | ICD-10-CM | POA: Diagnosis not present

## 2020-05-23 DIAGNOSIS — I5033 Acute on chronic diastolic (congestive) heart failure: Secondary | ICD-10-CM | POA: Diagnosis not present

## 2020-05-23 DIAGNOSIS — I517 Cardiomegaly: Secondary | ICD-10-CM | POA: Diagnosis not present

## 2020-05-23 DIAGNOSIS — Z954 Presence of other heart-valve replacement: Secondary | ICD-10-CM | POA: Diagnosis not present

## 2020-05-23 MED ORDER — ASPIRIN 325 MG PO TBEC: DELAYED_RELEASE_TABLET | ORAL | 0 refills | Status: DC

## 2020-05-23 MED ORDER — CEFTRIAXONE IV (FOR PTA / DISCHARGE USE ONLY): 2.0000 g | INTRAVENOUS | 0 refills | Status: DC

## 2020-05-23 MED ORDER — POTASSIUM CHLORIDE CRYS ER 10 MEQ PO TBCR: 20.0000 meq | EXTENDED_RELEASE_TABLET | Freq: Two times a day (BID) | ORAL | Status: DC

## 2020-05-23 MED ORDER — HEPARIN SOD (PORK) LOCK FLUSH 100 UNIT/ML IV SOLN
250.0000 [IU] | INTRAVENOUS | Status: AC | PRN
  Administered 2020-05-23: 250 [IU]
  Filled 2020-05-23: qty 2.5

## 2020-05-23 MED ORDER — FUROSEMIDE 20 MG PO TABS: 20.0000 mg | ORAL_TABLET | Freq: Two times a day (BID) | ORAL | Status: DC

## 2020-05-23 MED ORDER — OXYCODONE HCL 5 MG PO TABS
5.0000 mg | ORAL_TABLET | Freq: Four times a day (QID) | ORAL | 0 refills | Status: DC | PRN
Start: 2020-05-23 — End: 2020-05-23

## 2020-05-23 MED ORDER — DOXAZOSIN MESYLATE 8 MG PO TABS: 8.0000 mg | ORAL_TABLET | Freq: Every day | ORAL | Status: DC

## 2020-05-23 MED ORDER — OXYCODONE HCL 5 MG PO TABS
5.0000 mg | ORAL_TABLET | Freq: Four times a day (QID) | ORAL | 0 refills | Status: DC | PRN
Start: 2020-05-23 — End: 2020-06-30

## 2020-05-23 MED ORDER — SODIUM CHLORIDE 0.9 % IV SOLN: 2.0000 g | INTRAVENOUS | Status: DC

## 2020-05-23 MED ORDER — CARVEDILOL 25 MG PO TABS
25.0000 mg | ORAL_TABLET | Freq: Two times a day (BID) | ORAL | Status: DC
Start: 2020-05-23 — End: 2020-06-30

## 2020-05-23 NOTE — Progress Notes (Signed)
[id number] Education completed with pt and sister who voiced understanding. Reviewed staying in the tube, sternal precautions IS, and walking for ex, wound care. Gave diabetic and heart healthy diets and encouraged watching sodium also. Discussed CRP 2 and referred to [facility] program.  Graylon Good RN BSN 05/23/2020 10:06 AM

## 2020-05-23 NOTE — TOC Transition Note (Signed)
Transition of Care [facility]) - CM/SW Discharge Note   Patient Details  Name: Matthew Serrano. MRN: [id number] Date of Birth: [birth year]  Transition of Care [facility]) CM/SW Contact:  Vinie Sill, [place] Phone Number: 05/23/2020, 10:17 AM   Clinical Narrative:     Patient will DC to: [place] Date: 05/23/2020 Family Notified: patient decline- he has informed family Transport TJ:LLVD @ 12:00   Per MD patient is ready for discharge. RN, patient, and facility notified of DC. Discharge Summary sent to facility. RN given number for [phone], Room 109. Ambulance transport requested for patient.   Clinical Social Worker signing off.  Thurmond Butts, MSW, LCSWA Clinical Social Worker    Final next level of care: Skilled Nursing Facility Barriers to Discharge: Barriers Resolved   Patient Goals and CMS Choice        Discharge Placement PASRR number recieved: 05/21/20            Patient chooses bed at: [facility] Patient to be transferred to facility by: [place] Name of family member notified: patienet declined- he has contacted family Patient and family notified of of transfer: 05/23/20  Discharge Plan and Services In-house Referral: Clinical Social Work                                   Social Determinants of Health (SDOH) Interventions     Readmission Risk Interventions No flowsheet data found.

## 2020-05-23 NOTE — Progress Notes (Signed)
Attempted to call report to [phone] as provided by SW.  No answer.

## 2020-05-23 NOTE — Care Management Important Message (Signed)
Important Message  Patient Details  Name: Matthew Serrano. MRN: [id number] Date of Birth: [birth year]   Medicare Important Message Given:  Yes     Katalyna Socarras 05/23/2020, 4:02 PM

## 2020-05-23 NOTE — Telephone Encounter (Signed)
Spoke with pt, aware of echo date, time and location.

## 2020-05-23 NOTE — Progress Notes (Signed)
      [address]       [place] [address]             [phone]      3 Days Post-Op Procedure(s) (LRB): REMOVAL PORT-A-CATH (Right) Subjective: Some pain but feels well overall  Objective: Vital signs in last 24 hours: Temp:  [98.5 F (36.9 C)-99.5 F (37.5 C)] 98.7 F (37.1 C) (10/01 0338) Pulse Rate:  [91-108] 101 (10/01 0338) Cardiac Rhythm: Normal sinus rhythm (09/30 1900) Resp:  [14-20] 16 (10/01 0338) BP: (91-123)/(68-86) 123/84 (10/01 0338) SpO2:  [97 %-100 %] 97 % (10/01 0338) Weight:  [100.2 kg] 100.2 kg (10/01 0338)  Hemodynamic parameters for last 24 hours:    Intake/Output from previous day: 09/30 0701 - 10/01 0700 In: 840 [P.O.:840] Out: 200 [Urine:200] Intake/Output this shift: No intake/output data recorded.  General appearance: alert, cooperative and no distress Heart: regular rate and rhythm Lungs: mildly dim in bases Abdomen: benign Extremities: minor edema Wound: incis healing well  Lab Results: No results for input(s): WBC, HGB, HCT, PLT in the last 72 hours. BMET: No results for input(s): NA, K, CL, CO2, GLUCOSE, BUN, CREATININE, CALCIUM in the last 72 hours.  PT/INR: No results for input(s): LABPROT, INR in the last 72 hours. ABG    Component Value Date/Time   PHART 7.431 05/16/2020 0457   HCO3 23.2 05/16/2020 0457   TCO2 24 05/16/2020 0457   ACIDBASEDEF 1.0 05/16/2020 0457   O2SAT 92.0 05/16/2020 0457   CBG (last 3)  Recent Labs    05/20/20 0813  GLUCAP 109*    Meds Scheduled Meds: . aspirin EC  325 mg Oral Daily  . bisacodyl  10 mg Oral Daily   Or  . bisacodyl  10 mg Rectal Daily  . carvedilol  25 mg Oral BID WC  . chlorhexidine  15 mL Mouth/Throat BID  . Chlorhexidine Gluconate Cloth  6 each Topical Daily  . [place] Cardiac Surgery, Patient & Family Education   Does not apply Once  . docusate sodium  200 mg Oral Daily  . doxazosin  8 mg Oral QHS  . enoxaparin (LOVENOX) injection  30 mg  Subcutaneous QHS  . furosemide  40 mg Oral BID  . mouth rinse  15 mL Mouth Rinse BID  . pantoprazole  40 mg Oral Daily  . sodium chloride flush  10-40 mL Intracatheter Q12H  . sodium chloride flush  10-40 mL Intracatheter Q12H  . sodium chloride flush  3 mL Intravenous Q12H   Continuous Infusions: . sodium chloride Stopped (05/16/20 0950)  . sodium chloride 10 mL/hr at 05/18/20 1400  . cefTRIAXone (ROCEPHIN)  IV Stopped (05/22/20 1243)  . lactated ringers 10 mL/hr at 05/20/20 0721   PRN Meds:.sodium chloride, dextrose, metoprolol tartrate, ondansetron (ZOFRAN) IV, oxyCODONE, sodium chloride flush, sodium chloride flush, sodium chloride flush, traMADol  Xrays No results found.  Assessment/Plan: S/P Procedure(s) (LRB): REMOVAL PORT-A-CATH (Right)   1 doing well, afeb, VSS 2 sinus rhythm 3 sats good on RA 4 no new labs 5 stable for transfer to SNF   LOS: 10 days    Matthew Giovanni  PA-C Pager [phone] 05/23/2020

## 2020-05-28 ENCOUNTER — Telehealth (HOSPITAL_COMMUNITY): Payer: Self-pay

## 2020-05-28 NOTE — Telephone Encounter (Signed)
Pt insurance is active and benefits verified through Medicare A/B. Co-pay $0.00, DED $203.00/$203.00 met, out of pocket $0.00/$0.00 met, co-insurance 20%. No pre-authorization required. [name], 05/28/20 @ 1:52PM, WLN#[id number]  2ndary insurance is active and benefits verified through [employer].Marland Kitchen Co-pay $0.00, DED $0.00/$0.00 met, out of pocket $0.00/$0.00 met, co-insurance 0%. No pre-authorization required.  Will contact patient to see if he is interested in the Cardiac Rehab Program. If interested, patient will need to complete follow up appt. Once completed, patient will be contacted for scheduling upon review by the RN Navigator.

## 2020-05-29 DIAGNOSIS — I358 Other nonrheumatic aortic valve disorders: Secondary | ICD-10-CM | POA: Diagnosis not present

## 2020-05-29 DIAGNOSIS — I10 Essential (primary) hypertension: Secondary | ICD-10-CM | POA: Diagnosis not present

## 2020-05-29 DIAGNOSIS — R7881 Bacteremia: Secondary | ICD-10-CM | POA: Diagnosis not present

## 2020-05-29 DIAGNOSIS — E1169 Type 2 diabetes mellitus with other specified complication: Secondary | ICD-10-CM | POA: Diagnosis not present

## 2020-05-29 NOTE — Telephone Encounter (Signed)
Thank you :)

## 2020-05-30 DIAGNOSIS — R600 Localized edema: Secondary | ICD-10-CM | POA: Diagnosis not present

## 2020-05-30 DIAGNOSIS — I330 Acute and subacute infective endocarditis: Secondary | ICD-10-CM | POA: Diagnosis not present

## 2020-05-30 DIAGNOSIS — L309 Dermatitis, unspecified: Secondary | ICD-10-CM | POA: Diagnosis not present

## 2020-06-04 ENCOUNTER — Telehealth: Payer: Self-pay | Admitting: *Deleted

## 2020-06-04 NOTE — Telephone Encounter (Signed)
Matthew Serrano daughter, Matthew Serrano, called reporting Matthew Serrano legs were swollen. He reported the swelling to her on the phone. She lives in [place] and he is currently in a skilled nursing facility here in [place] s/p AVR. She states he does not report any pain with the swelling. Recommended to daughter that he raise his legs after physical therapy to help decrease swelling. Daughter was told to reach back out if any other concerns arise.

## 2020-06-05 ENCOUNTER — Other Ambulatory Visit: Payer: Self-pay | Admitting: Adult Health

## 2020-06-05 DIAGNOSIS — I10 Essential (primary) hypertension: Secondary | ICD-10-CM

## 2020-06-06 ENCOUNTER — Other Ambulatory Visit: Payer: Self-pay | Admitting: *Deleted

## 2020-06-06 NOTE — Telephone Encounter (Signed)
This was sent in 15 days ago by Thoracic surgery he has a 90 days supply

## 2020-06-06 NOTE — Patient Outreach (Signed)
Member screened for potential [facility] Care Management needs as a benefit of [place] Medicare.   Previously informed by [facility] Liaison that member transitioned to [facility].  Update received from SNF SW indicating member continues to receive IV antibiotics. Transition plan is to return home. Lived alone prior.  Will continue to follow for potential [facility] Care Management needs.  Marthenia Rolling, MSN-Ed, RN,BSN [place] Acute Care Coordinator [phone] [facility]) [phone]  (Toll free office)

## 2020-06-13 ENCOUNTER — Ambulatory Visit: Payer: Medicare Other | Admitting: Cardiovascular Disease

## 2020-06-13 ENCOUNTER — Other Ambulatory Visit: Payer: Self-pay | Admitting: Thoracic Surgery (Cardiothoracic Vascular Surgery)

## 2020-06-13 DIAGNOSIS — I358 Other nonrheumatic aortic valve disorders: Secondary | ICD-10-CM

## 2020-06-16 ENCOUNTER — Telehealth: Payer: Self-pay | Admitting: *Deleted

## 2020-06-16 ENCOUNTER — Other Ambulatory Visit: Payer: Self-pay

## 2020-06-16 ENCOUNTER — Ambulatory Visit: Payer: Medicare Other | Admitting: Thoracic Surgery (Cardiothoracic Vascular Surgery)

## 2020-06-16 ENCOUNTER — Ambulatory Visit
Admission: RE | Admit: 2020-06-16 | Discharge: 2020-06-16 | Disposition: A | Discharge status: Home / Self Care | Payer: Medicare Other | Source: Ambulatory Visit | Attending: Thoracic Surgery (Cardiothoracic Vascular Surgery) | Admitting: Thoracic Surgery (Cardiothoracic Vascular Surgery)

## 2020-06-16 ENCOUNTER — Ambulatory Visit (INDEPENDENT_AMBULATORY_CARE_PROVIDER_SITE_OTHER): Payer: Self-pay | Admitting: Thoracic Surgery (Cardiothoracic Vascular Surgery)

## 2020-06-16 ENCOUNTER — Encounter: Payer: Self-pay | Admitting: Thoracic Surgery (Cardiothoracic Vascular Surgery)

## 2020-06-16 VITALS — BP 161/101 | HR 86 | Resp 18 | Ht 67.0 in | Wt 213.2 lb

## 2020-06-16 DIAGNOSIS — I772 Rupture of artery: Secondary | ICD-10-CM

## 2020-06-16 DIAGNOSIS — R7881 Bacteremia: Secondary | ICD-10-CM

## 2020-06-16 DIAGNOSIS — I517 Cardiomegaly: Secondary | ICD-10-CM | POA: Diagnosis not present

## 2020-06-16 DIAGNOSIS — I358 Other nonrheumatic aortic valve disorders: Secondary | ICD-10-CM

## 2020-06-16 DIAGNOSIS — I351 Nonrheumatic aortic (valve) insufficiency: Secondary | ICD-10-CM

## 2020-06-16 DIAGNOSIS — Z954 Presence of other heart-valve replacement: Secondary | ICD-10-CM

## 2020-06-16 DIAGNOSIS — Z452 Encounter for adjustment and management of vascular access device: Secondary | ICD-10-CM | POA: Diagnosis not present

## 2020-06-16 DIAGNOSIS — J9811 Atelectasis: Secondary | ICD-10-CM | POA: Diagnosis not present

## 2020-06-16 DIAGNOSIS — I350 Nonrheumatic aortic (valve) stenosis: Secondary | ICD-10-CM

## 2020-06-16 DIAGNOSIS — B955 Unspecified streptococcus as the cause of diseases classified elsewhere: Secondary | ICD-10-CM

## 2020-06-16 DIAGNOSIS — J90 Pleural effusion, not elsewhere classified: Secondary | ICD-10-CM | POA: Diagnosis not present

## 2020-06-16 MED ORDER — FUROSEMIDE 20 MG PO TABS
40.0000 mg | ORAL_TABLET | Freq: Two times a day (BID) | ORAL | 1 refills | Status: DC
Start: 2020-06-16 — End: 2020-06-30

## 2020-06-16 NOTE — Telephone Encounter (Signed)
Matthew Serrano called to confirm that it is okay for him to go to the dentist this week to see Dr. Benson Norway for extractions. Per Dr. Guy Sandifer office note from today I relayed that yes, he is able to keep his appointment for extractions. All questions answered at this time.

## 2020-06-16 NOTE — Progress Notes (Signed)
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  The patient's postoperative convalescence was remarkably uncomplicated.  His old Port-A-Cath was removed and a PICC line placed for the patient to undergo a 6-week course of intravenous Rocephin, which is due to be completed on June 26, 2020.  The patient was ultimately discharged to a local skilled nursing facility [facility]) on May 23, 2020.  He returns to our office today for routine follow-up.  His previous follow-up appointment with Dr. Burt Knack was canceled.  Patient states that he has been making good progress.  He is walking at least twice every day and participating in other physical therapy rehabilitation.  He continues to receive his intravenous antibiotics on a daily basis.  Appetite is improving.  Strength is improving.  He denies shortness of breath.  He has minimal residual soreness in his chest.  He has not had fevers or chills.  He has developed worsening lower extremity edema.   Current Outpatient Medications  Medication Sig Dispense Refill  . abiraterone acetate (ZYTIGA) 250 MG tablet TAKE 4 TABLETS (1,000 MG TOTAL) BY MOUTH DAILY. TAKE ON AN EMPTY STOMACH 1 HOUR BEFORE  OR 2 HOURS AFTER A MEAL 120 tablet 0  . aspirin EC 325 MG EC tablet One tab po daily 30 tablet 0  . atorvastatin (LIPITOR) 40 MG tablet TAKE 1 TABLET EVERY DAY (Patient taking differently: Take 40 mg by mouth daily. ) 90 tablet 2  . carvedilol (COREG) 25 MG tablet Take 1 tablet (25 mg total) by mouth 2 (two) times daily with a meal.    . cefTRIAXone (ROCEPHIN) IVPB Inject 2 g into the vein daily. Indication:  endocarditis First Dose: Yes Last Day of Therapy:  06/26/2020 Labs - Once weekly:  CBC/D and BMP, Labs - Every other week:  ESR and CRP Method of administration: IV Push Method of administration may be changed at the discretion of home infusion pharmacist based upon assessment of the patient and/or caregiver's ability to self-administer the medication ordered. 36 Units 0  . cholecalciferol (VITAMIN D3) 25 MCG (1000 UT) tablet Take 1,000 Units by mouth daily.    Marland Kitchen doxazosin (CARDURA) 8 MG tablet Take 1 tablet (8 mg total) by mouth at bedtime.    . ferrous sulfate 325 (65 FE) MG EC tablet Take 1 tablet (325 mg total) by mouth 2 (two) times daily before a meal. 60 tablet 2  . furosemide (LASIX) 20 MG tablet Take 1 tablet (20 mg total) by mouth 2 (two) times daily.    Marland Kitchen glipiZIDE (GLUCOTROL XL) 10 MG 24 hr tablet TAKE 1 TABLET TWICE DAILY (Patient taking differently: Take 10 mg by mouth in the morning and at bedtime. ) 180 tablet 0  . levothyroxine (SYNTHROID) 150 MCG tablet TAKE 1  TABLET EVERY DAY 90 tablet 0  . metFORMIN (GLUCOPHAGE) 1000 MG tablet TAKE 1 TABLET TWICE DAILY WITH MEALS (Patient taking differently: Take 1,000 mg by mouth 2 (two) times daily with a meal. ) 180 tablet 1  . omeprazole (PRILOSEC) 40 MG capsule TAKE 1 CAPSULE BY MOUTH EVERY DAY (Patient taking differently: Take 40 mg by mouth daily. Patient don't remember taking this medication.) 90 capsule 1  . oxyCODONE (OXY IR/ROXICODONE) 5 MG immediate release tablet Take 1 tablet (5 mg total) by mouth every 6 (six) hours as  needed for severe pain. 28 tablet 0  . potassium chloride SA (KLOR-CON) 10 MEQ tablet Take 2 tablets (20 mEq total) by mouth 2 (two) times daily. TAKE 1 TABLET TWICE DAILY    . predniSONE (DELTASONE) 5 MG tablet TAKE 1 TABLET (5 MG TOTAL) BY MOUTH DAILY WITH BREAKFAST. 90 tablet 3   No current facility-administered medications for this visit.      Physical Exam:   BP (!) 161/101 (BP Location: Left Arm, Patient Position: Sitting)   Pulse 86   Resp 18   Ht _0  (1.702 m)   Wt 213 lb 3.2 oz (96.7 kg)   SpO2 95% Comment: RA with mask on  BMI 33.39 kg/m   General:  Well-appearing  Chest:   Clear to auscultation  CV:   Regular rate and rhythm without murmur  Incisions:  Healing nicely, sternum is stable  Abdomen:  Soft nontender  Extremities:  Warm and well-perfused with severe bilateral lower extremity edema  Diagnostic Tests:  CHEST - 2 VIEW  COMPARISON:  05/19/2020 chest radiograph.  FINDINGS: Right PICC terminates in the lower third of the SVC. Stable cardiomediastinal silhouette with mild cardiomegaly. No pneumothorax. Small bilateral pleural effusions, left greater than right slightly decreased. No pulmonary edema. Improved aeration at the lung bases with persistent mild-to-moderate left lung base atelectasis.  IMPRESSION: 1. Small bilateral pleural effusions, left greater than right, slightly decreased. 2. Improved aeration at the lung bases with persistent mild-to-moderate left lung base atelectasis. 3. Stable mild cardiomegaly without pulmonary edema.   Electronically Signed   By: Ilona Sorrel M.D.   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  The patient's blood  pressure is a little bit elevated in our office today and he has developed severe lower extremity edema.   Plan:  I have increased the patient's dose of Lasix to 40 mg by mouth twice daily.  We will make arrangements for his appointment in the cardiology clinic to be rescheduled soon for follow-up.  He may need to be started on an ACE inhibitor or ARB if his blood pressure remains elevated.  I have encouraged the patient to continue to increase his physical activity as tolerated with his primary limitation remaining that he refrain from heavy lifting or strenuous use of his arms or shoulders for at least another 2 months.  I have instructed the patient to keep his legs propped up as much as possible when he is not up and ambulating.  I have also instructed patient to make sure that his weight is being checked on a daily basis.  Finally, we will contact Dr. Raynelle Dick office to make sure that the patient has been scheduled for dental extraction while  he remains on intravenous antibiotics.  The patient will return to our office in approximately 4 weeks for routine follow-up.    Valentina Gu. Roxy Manns, MD 06/16/2020 1:08 PM

## 2020-06-16 NOTE — Progress Notes (Signed)
Wow. Tough situation, great save, Cub.

## 2020-06-16 NOTE — Patient Instructions (Addendum)
Check your weight on a regular basis and keep a log for your records.  Look for signs of fluid overload such as worsening swelling of your lower legs, increased shortness of breath with activity, and/or a dry nonproductive cough.  Discussed these findings with your cardiologist including whether or not you should adjust your fluid pill dosage (diuretic).  Keep your legs propped up as much as possible whenever you aren't up walking  Increase your dose of furosemide (Lasix) to 40 mg by mouth twice daily  Continue all other previous medications without any changes at this time  Continue to avoid any heavy lifting or strenuous use of your arms or shoulders for at least a total of three months from the time of surgery.  After three months you may gradually increase how much you lift or otherwise use your arms or chest as tolerated, with limits based upon whether or not activities lead to the return of significant discomfort.

## 2020-06-17 ENCOUNTER — Telehealth: Payer: Self-pay

## 2020-06-17 ENCOUNTER — Other Ambulatory Visit: Payer: Self-pay | Admitting: Oncology

## 2020-06-17 DIAGNOSIS — C61 Malignant neoplasm of prostate: Secondary | ICD-10-CM

## 2020-06-17 NOTE — Telephone Encounter (Signed)
-----   Message from [name], MD sent at 06/16/2020  5:04 PM EDT ----- No problem will do ----- Message ----- From: Rexene Alberts, MD Sent: 06/16/2020   2:07 PM EDT To: [name], MD, Eileen Stanford, PA-C  Can you guys get Mr Hall in for f/u appt within the next week or so?  Looks good but fluid overload with bad LE edema.  I doubled his lasix.  BP up a little too - may need ACE-I or ARB.  Thx!

## 2020-06-17 NOTE — Telephone Encounter (Signed)
Offered the patient a visit this Friday, but he declined.  Scheduled the patient with Cecilie Kicks 11/4. He understands to call prior to that time if symptoms do not improve. He was grateful for assistance.

## 2020-06-19 ENCOUNTER — Other Ambulatory Visit: Payer: Self-pay

## 2020-06-19 ENCOUNTER — Encounter: Payer: Self-pay | Admitting: Internal Medicine

## 2020-06-19 ENCOUNTER — Ambulatory Visit (INDEPENDENT_AMBULATORY_CARE_PROVIDER_SITE_OTHER): Payer: Medicare Other | Admitting: Internal Medicine

## 2020-06-19 DIAGNOSIS — I358 Other nonrheumatic aortic valve disorders: Secondary | ICD-10-CM | POA: Diagnosis not present

## 2020-06-19 NOTE — Progress Notes (Signed)
[facility] for [facility]ious [facility]  Patient Active Problem List   Diagnosis Date Noted  . Abscess of aortic valve 05/14/2020    Priority: High  . Streptococcal bacteremia 05/13/2020    Priority: High  . Aortic valve endocarditis 05/13/2020    Priority: High  . Severe aortic insufficiency 05/13/2020    Priority: High  . Aortic stenosis     Priority: High  . Streptococcal endocarditis 05/21/2020  . 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  . 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 History of GI bleed 05/14/2020  . Normocytic anemia 05/14/2020  . Endocarditis 05/14/2020  . Fistula between aorta and pulmonary artery ([place]) 05/14/2020  . Acute CHF (congestive heart failure) ([place]) 05/13/2020  . Respiratory distress 05/13/2020  . Acute blood loss anemia 05/02/2020  . Malignant neoplasm of prostate ([place]) 06/23/2018  . Goals of care, counseling/discussion 06/23/2018  . Left inguinal hernia 09/04/2015  . Alcoholism ([place]) 06/10/2014  . Anemia of chronic disease 05/23/2014  . Elevated PSA 01/20/2012  . HEMORRHAGE OF RECTUM AND ANUS 12/15/2008  . ERECTILE DYSFUNCTION, MILD 12/14/2007  . SEBORRHEIC KERATOSIS, INFLAMED 08/03/2007  . NEOPLASM, SKIN, UNCERTAIN BEHAVIOR 68/10/2120  . Hypothyroidism 07/06/2007  . Diabetes mellitus ([place]) 07/06/2007  . Hyperlipidemia 07/06/2007  . Essential hypertension 07/06/2007    Patient's Medications  New Prescriptions   No medications on file  Previous Medications   ABIRATERONE ACETATE (ZYTIGA) 250 MG TABLET    TAKE 4 TABLETS (1,000 MG TOTAL) BY MOUTH DAILY. TAKE ON AN EMPTY STOMACH 1 HOUR BEFORE OR 2 HOURS AFTER A MEAL   ASPIRIN EC 325 MG EC TABLET    One tab po daily   ATORVASTATIN (LIPITOR) 40 MG TABLET    TAKE 1 TABLET EVERY DAY   CARVEDILOL (COREG) 25 MG TABLET    Take 1 tablet (25 mg total) by mouth 2 (two) times daily with a meal.   CEFTRIAXONE (ROCEPHIN) IVPB    Inject 2 g into the vein daily. Indication:   endocarditis First Dose: Yes Last Day of Therapy:  06/26/2020 Labs - Once weekly:  CBC/D and BMP, Labs - Every other week:  ESR and CRP Method of administration: IV Push Method of administration may be changed at the discretion of home infusion pharmacist based upon assessment of the patient and/or caregiver's ability to self-administer the medication ordered.   CHOLECALCIFEROL (VITAMIN D3) 25 MCG (1000 UT) TABLET    Take 1,000 Units by mouth daily.   DOXAZOSIN (CARDURA) 8 MG TABLET    Take 1 tablet (8 mg total) by mouth at bedtime.   FERROUS SULFATE 325 (65 FE) MG EC TABLET    Take 1 tablet (325 mg total) by mouth 2 (two) times daily before a meal.   FUROSEMIDE (LASIX) 20 MG TABLET    Take 2 tablets (40 mg total) by mouth 2 (two) times daily.   GLIPIZIDE (GLUCOTROL XL) 10 MG 24 HR TABLET    TAKE 1 TABLET TWICE DAILY   LEVOTHYROXINE (SYNTHROID) 150 MCG TABLET    TAKE 1 TABLET EVERY DAY   METFORMIN (GLUCOPHAGE) 1000 MG TABLET    TAKE 1 TABLET TWICE DAILY WITH MEALS   OMEPRAZOLE (PRILOSEC) 40 MG CAPSULE    TAKE 1 CAPSULE BY MOUTH EVERY DAY   OXYCODONE (OXY IR/ROXICODONE) 5 MG IMMEDIATE RELEASE TABLET    Take 1 tablet (5 mg total) by mouth every 6 (six) hours as needed for severe pain.   POTASSIUM  CHLORIDE SA (KLOR-CON) 10 MEQ TABLET    Take 2 tablets (20 mEq total) by mouth 2 (two) times daily. TAKE 1 TABLET TWICE DAILY   PREDNISONE (DELTASONE) 5 MG TABLET    TAKE 1 TABLET (5 MG TOTAL) BY MOUTH DAILY WITH BREAKFAST.  Modified Medications   No medications on file  Discontinued Medications   No medications on file    Subjective: Matthew Serrano is in for his hospital follow-up visit.  He was admitted to the hospital about 5 weeks ago with subacute aortic valve endocarditis.  Blood cultures were positive for strep anginosis.  He had a large aortic valve abscess and required aortic root replacement by Dr. Lilly Cove.  He was discharged on IV ceftriaxone and has now completed 5 weeks of therapy.  He is  feeling much better.  He says that he is making progress with physical therapy.  He is walking about 2 blocks daily without the aid of a walker or cane.  He has not had any problems tolerating his PICC or ceftriaxone.  He saw his general dentist, Dr. Sheppard Penton, today and had 1 tooth extracted.  He is going to have further oral surgery by Dr. Frederik Schmidt in the near future but that surgery has not been scheduled yet.  Review of Systems: Review of Systems  Constitutional: Negative for chills, diaphoresis and fever.  Respiratory: Negative for cough and shortness of breath.   Cardiovascular: Positive for chest pain.  Gastrointestinal: Negative for abdominal pain, diarrhea, nausea and vomiting.  Musculoskeletal: Negative for back pain and joint pain.    Past Medical History:  Diagnosis Date  . Aortic valve endocarditis 05/13/2020   STREPTOCOCCUS ANGINOSIS  . AVM (arteriovenous malformation) of colon   . Cancer [facility])    prostate cancer  . DIABETES MELLITUS, TYPE II 07/06/2007  . ED (erectile dysfunction)   . Elevated PSA 01/20/2012  . Fistula between aorta and pulmonary artery ([place]) 05/14/2020  . History of nuclear stress test    Myoview 8/18: EF 64, inferobasal thinning, no ischemia, low risk  . HYPERLIPIDEMIA 07/06/2007  . HYPERTENSION 07/06/2007  . HYPOTHYROIDISM 07/06/2007  . Leg pain    ABIs 8/18:  Normal   . Obesity   . 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 Severe aortic insufficiency 05/13/2020   acute onset in setting of bacterial endocarditis  . Severe aortic stenosis     Social History   Tobacco Use  . Smoking status: Former Research scientist (life sciences)  . Smokeless tobacco: Never Used  . Tobacco comment: quit 2005  Vaping Use  . Vaping Use: Never used  Substance Use Topics  . Alcohol use: Not Currently    Alcohol/week:  21.0 standard drinks    Types: 21 Cans of beer per week  . Drug use: No    Family History  Problem Relation Age of Onset  . Hypertension Other   . Breast cancer Sister   . Breast cancer Other        family history  . Breast cancer Brother        prostate ca  . Aneurysm Mother        Brain aneurysm   . Prostate cancer Father   . Colon cancer Neg Hx   . Esophageal cancer Neg Hx   . Stomach cancer Neg Hx   .  Rectal cancer Neg Hx     No Known Allergies  Objective: Vitals:   06/19/20 0927  BP: (!) 185/115  Pulse: 93  Temp: 98 F (36.7 C)  TempSrc: Oral  Weight: 215 lb (97.5 kg)   Body mass index is 33.67 kg/m.  Physical Exam Constitutional:      Comments: He is in good spirits.  Cardiovascular:     Rate and Rhythm: Normal rate.     Heart sounds: No murmur heard.   Pulmonary:     Effort: Pulmonary effort is normal.     Breath sounds: Normal breath sounds.  Chest:    Abdominal:     Palpations: Abdomen is soft.     Tenderness: There is no abdominal tenderness.  Musculoskeletal:     Right lower leg: Edema present.     Left lower leg: Edema present.  Psychiatric:        Mood and Affect: Mood normal.     Lab Results    Problem List Items Addressed This Visit      High   Aortic valve endocarditis    He is improving on therapy for streptococcal aortic valve endocarditis.  I prefer for him to remain on antibiotics until his major dental surgeries have been completed.  I will see him back in 1 week and consider PICC removal and converting him to oral amoxicillin.          Michel Bickers, MD [facility] [phone] pager   [phone] cell 06/19/2020, 9:58 AM

## 2020-06-19 NOTE — Assessment & Plan Note (Signed)
He is improving on therapy for streptococcal aortic valve endocarditis.  I prefer for him to remain on antibiotics until his major dental surgeries have been completed.  I will see him back in 1 week and consider PICC removal and converting him to oral amoxicillin.

## 2020-06-24 ENCOUNTER — Other Ambulatory Visit: Payer: Self-pay

## 2020-06-24 ENCOUNTER — Ambulatory Visit (HOSPITAL_COMMUNITY): Payer: No Typology Code available for payment source | Attending: Cardiovascular Disease

## 2020-06-24 DIAGNOSIS — I351 Nonrheumatic aortic (valve) insufficiency: Secondary | ICD-10-CM | POA: Insufficient documentation

## 2020-06-24 LAB — ECHOCARDIOGRAM COMPLETE
AR max vel: 1.72 cm2
AV Area VTI: 2.05 cm2
AV Area mean vel: 1.9 cm2
AV Mean grad: 5 mmHg
AV Peak grad: 10.1 mmHg
Ao pk vel: 1.59 m/s
Area-P 1/2: 3.31 cm2
S' Lateral: 2.9 cm

## 2020-06-25 MED FILL — predniSONE 5 MG TABS: 5 | 90 days supply | Qty: 90 | Fill #2

## 2020-06-25 MED FILL — ABIRATERONE ACETATE 250 MG: 250 | 30 days supply | Qty: 120 | Fill #0

## 2020-06-25 NOTE — Progress Notes (Signed)
Cardiology Office Note  Date:  06/25/2020   ID:  Beecher Mcardle., DOB 1945/10/16, MRN [id number]  PCP:  [name], NP  Cardiologist:  Dr. Burt Knack _____________  Follow-up for lower leg edema  _____________   History of Present Illness: Matthew Serrano. is a 74 y.o. male with pmh og severe symptomatic aortic stenosis, nonobstructive CAD by cath 01/2020, HTN, HLD, DM2, chronic lower extremity edema, metastatic prostate cancer on suppression chemotherapy, and recent GI bleed with admission in 01/2020.  The patient has a history of Aortic stenosis. Echo 02/05/20 showed normal LV function with severe AS with peal vellocity >4.96ms, mean gradient 4110m and area calculated 0.88cm2. There was no AI at that time. He was referred to structural heart team. Diagnostic cardiac cath showed nonobstructive CAD and normal Right heart pressures. CT was performed and he was referred for possible surgery. He saw Dr. BaCyndia Bentuly 7.2021 and felt to be a good candidate. Plan was for dental intervention prior to surgery. The patient was then hospitalized for low-grade fever and symptomatic anemia with hemoglobin down to 6.3 and he underwent transfusion. EGD was unremarkable and colonoscopy showed nonbleeding cecal tangiectasias and were treated. [facility] with possible vegetation.  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 He was discharged with a PICC to SNF. Noted to have worsening LLE and referred to cardiology.  Lasix was  increased to 4071mID.  Today, he reports he is overall recovering well. He is still at the nursing home. He still has the PICC line in and ID planning for more IV abx prior to dental procedure. He has significant swelling on exam despite increase in lasix. He feels the swelling has mildly improved since discharge. Weight at discharge was 220lbs, today 210. Weight last week was 215lbs, so overall weight coming down. Previous notes do mention chronic lower leg edema so unsure how much is new. No sob. O2 level is good. No chest pain. He is not sure how much salt he eats since he's at the SNF.Lunges clear on exam and no JVD appreciated. He denies symptoms of palpitations, orthopnea, PND, claudication, dizziness, presyncope, syncope, bleeding, or neurologic sequela.  _____________   Past Medical History:  Diagnosis Date  . Aortic valve endocarditis 05/13/2020   STREPTOCOCCUS ANGINOSIS  . AVM (arteriovenous malformation) of colon   . [facility]  . DIABETES MELLITUS, TYPE II 07/06/2007  . ED (erectile dysfunction)   . Elevated PSA 01/20/2012  . Fistula between [place] and pulmonary artery [place]  . History of nuclear stress test    Myoview 8/18: EF 64, inferobasal thinning, no ischemia, low risk  . HYPERLIPIDEMIA 07/06/2007  . HYPERTENSION 07/06/2007  . HYPOTHYROIDISM 07/06/2007  . Leg pain    ABIs 8/18:  Normal   . Obesity   . 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 Severe aortic insufficiency 05/13/2020  acute onset in setting of bacterial endocarditis  . Severe aortic stenosis    Past Surgical History:  Procedure Laterality Date  . AORTIC VALVE REPLACEMENT N/A 05/15/2020   Procedure: AORTIC VALVE REPLACEMENT (AVR) SIZE 23 MM,  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;  Surgeon: Rexene Alberts, MD;  Location: [place];  Service: Open Heart Surgery;  Laterality: N/A;  . ASCENDING AORTIC ROOT REPLACEMENT N/A 05/15/2020   Procedure: HUMAN ALLOGRAFT AORTIC ROOT REPLACEMENT;  Surgeon: Rexene Alberts, MD;  Location: [place];  Service: Open Heart Surgery;  Laterality: N/A;  . COLONOSCOPY    . COLONOSCOPY N/A 05/03/2020   Procedure: COLONOSCOPY;  Surgeon: Juanita Craver, MD;  Location: [facility];  Service: Endoscopy;  Laterality: N/A;  . COLONOSCOPY W/ BIOPSIES AND POLYPECTOMY    . ENTEROSCOPY N/A 05/02/2020   Procedure: ENTEROSCOPY;  Surgeon: Rush Landmark Telford Nab., MD;  Location[facility];  Service: Gastroenterology;  Laterality: N/A;  . HEMOSTASIS CLIP PLACEMENT  05/03/2020   Procedure: HEMOSTASIS CLIP PLACEMENT;  Surgeon: Juanita Craver, MD;  Location: [facility];  Service: Endoscopy;;  . HOT HEMOSTASIS N/A 05/03/2020   Procedure: HOT HEMOSTASIS (ARGON PLASMA COAGULATION/BICAP);  Surgeon: Juanita Craver, MD;  Location: [facility];  Service: Endoscopy;  Laterality: N/A;  . IR IMAGING GUIDED PORT INSERTION  07/05/2018  . POLYPECTOMY    . PORT-A-CATH REMOVAL Right 05/20/2020   Procedure: REMOVAL PORT-A-CATH;  Surgeon: Rexene Alberts, MD;  Location: [place];  Service: Thoracic;  Laterality: Right;  . RIGHT/LEFT HEART CATH AND CORONARY ANGIOGRAPHY N/A 02/13/2020   Procedure: RIGHT/LEFT HEART CATH AND CORONARY ANGIOGRAPHY;  Surgeon: Belva Crome, MD;  Location: [place] [facility];  Service: Cardiovascular;  Laterality: N/A;  . SUBMUCOSAL INJECTION  05/02/2020   Procedure: SUBMUCOSAL INJECTION;  Surgeon: Irving Copas., MD;  Location: [facility];  Service: Gastroenterology;;  . TEE WITHOUT CARDIOVERSION N/A 05/14/2020   Procedure: TRANSESOPHAGEAL ECHOCARDIOGRAM (TEE);  Surgeon: Sanda Klein, MD;  Location: [place];  Service: Cardiovascular;  Laterality: N/A;  . TEE WITHOUT CARDIOVERSION N/A 05/15/2020   Procedure: TRANSESOPHAGEAL ECHOCARDIOGRAM (TEE);  Surgeon: Rexene Alberts, MD;  Location: [place];  Service: Open Heart Surgery;  Laterality: N/A;   _____________  Current Outpatient Medications  Medication Sig Dispense Refill  . abiraterone acetate (ZYTIGA) 250 MG tablet TAKE 4 TABLETS (1,000 MG TOTAL) BY MOUTH DAILY. TAKE ON AN EMPTY STOMACH 1 HOUR BEFORE OR 2 HOURS AFTER A MEAL 120 tablet 0  . aspirin EC 325 MG EC tablet One tab po daily 30 tablet 0  . atorvastatin (LIPITOR) 40 MG tablet TAKE 1 TABLET EVERY DAY (Patient taking differently: Take 40 mg by mouth daily. ) 90 tablet 2  . carvedilol (COREG) 25 MG tablet Take 1 tablet (25 mg total) by mouth 2 (two) times daily with a meal.    . cefTRIAXone (ROCEPHIN) IVPB Inject 2 g into the vein daily. Indication:  endocarditis First Dose: Yes Last Day of Therapy:  06/26/2020 Labs - Once weekly:  CBC/D and BMP, Labs - Every other week:  ESR and CRP Method of administration: IV Push Method of administration may be changed at the discretion of home infusion pharmacist based upon assessment of the patient and/or caregiver's ability to self-administer the medication ordered. 36 Units 0  . cholecalciferol (VITAMIN D3) 25 MCG (1000 UT) tablet Take 1,000 Units by mouth daily.    Marland Kitchen doxazosin (CARDURA) 8 MG tablet Take 1 tablet (8 mg total) by mouth at  bedtime.    . ferrous sulfate 325 (65 FE) MG EC tablet Take 1 tablet (325 mg total) by mouth 2 (two) times daily before a meal. 60 tablet 2  . furosemide (LASIX) 20 MG tablet Take 2 tablets (40 mg total) by mouth 2 (two) times daily. 30 tablet 1  . glipiZIDE (GLUCOTROL XL) 10 MG 24 hr tablet TAKE 1 TABLET TWICE DAILY (Patient taking differently: Take 10 mg by mouth in the morning and at bedtime. ) 180 tablet 0  . levothyroxine (SYNTHROID) 150 MCG tablet TAKE 1 TABLET EVERY DAY 90 tablet 0  . metFORMIN (GLUCOPHAGE) 1000 MG tablet TAKE 1 TABLET TWICE DAILY WITH MEALS (Patient taking differently: Take 1,000 mg by mouth 2 (two) times daily with a meal. ) 180 tablet 1  . omeprazole  (PRILOSEC) 40 MG capsule TAKE 1 CAPSULE BY MOUTH EVERY DAY (Patient taking differently: Take 40 mg by mouth daily. Patient don't remember taking this medication.) 90 capsule 1  . oxyCODONE (OXY IR/ROXICODONE) 5 MG immediate release tablet Take 1 tablet (5 mg total) by mouth every 6 (six) hours as needed for severe pain. 28 tablet 0  . potassium chloride SA (KLOR-CON) 10 MEQ tablet Take 2 tablets (20 mEq total) by mouth 2 (two) times daily. TAKE 1 TABLET TWICE DAILY    . predniSONE (DELTASONE) 5 MG tablet TAKE 1 TABLET (5 MG TOTAL) BY MOUTH DAILY WITH BREAKFAST. 90 tablet 3   No current facility-administered medications for this visit.   _____________   Allergies:   Patient has no known allergies.  _____________   Social History:  The patient  reports that he has quit smoking. He has never used smokeless tobacco. He reports previous alcohol use of about 21.0 standard drinks of alcohol per week. He reports that he does not use drugs.  _____________   Family History:  The patient's family history includes Aneurysm in his mother; Breast cancer in his brother, sister, and another family member; Hypertension in an other family member; Prostate cancer in his father.  _____________   ROS:  Please see the history of present illness.   Positive for Lower leg edema,   All other systems are reviewed and negative.  _____________   PHYSICAL EXAM: VS:  There were no vitals taken for this visit. , BMI There is no height or weight on file to calculate BMI. GEN: Well nourished, well developed, in no acute distress  HEENT: normal  Neck: no JVD, carotid bruits, or masses Cardiac: RRR; no murmurs, rubs, or gallops. No clubbing, cyanosis, 3+B/L lower led edema.  Radials/DP/PT 2+ and equal bilaterally.  Respiratory:  clear to auscultation bilaterally, normal work of breathing GI: soft, nontender, nondistended, + BS MS: no deformity or atrophy  Skin: warm and dry, no rash Neuro:  Strength and sensation are  intact Psych: euthymic mood, full affect _____________  EKG:   N/A  Recent Labs: 07/26/2019: TSH 1.92 05/13/2020: B Natriuretic Peptide 484.0 05/14/2020: ALT 30 05/16/2020: Magnesium 2.1 05/20/2020: BUN 8; Creatinine, Ser 0.93; Hemoglobin 11.3; Platelets 274; Potassium 4.3; Sodium 141  07/26/2019: Cholesterol 154; HDL 48.20; LDL Cholesterol 83; Total CHOL/HDL Ratio 3; Triglycerides 113.0; VLDL 22.6  CrCl cannot be calculated (Patient's most recent lab result is older than the maximum 21 days allowed.).  Wt Readings from Last 3 Encounters:  06/19/20 215 lb (97.5 kg)  06/16/20 213 lb 3.2 oz (96.7 kg)  05/23/20 220 lb 14.4 oz (100.2 kg)    Relevant Studies:  Echo 06/24/20  1.  Left ventricular ejection fraction, by estimation, is 55 to 60%. The  left ventricle has normal function. The left ventricle has no regional  wall motion abnormalities. There is mild concentric left ventricular  hypertrophy. Left ventricular diastolic  parameters are consistent with Grade II diastolic dysfunction  (pseudonormalization). Elevated left ventricular end-diastolic pressure.  2. Right ventricular systolic function is normal. The right ventricular  size is normal. There is normal pulmonary artery systolic pressure.  3. Left atrial size was mildly dilated.  4. The mitral valve is normal in structure. Trivial mitral valve  regurgitation. No evidence of mitral stenosis.  5. Repaired aorta/aortic valve without stenosis or regurgitation. The  aortic valve has been repaired/replaced. Aortic valve regurgitation is not  visualized. No aortic stenosis is present. There is a valve present in the  aortic position. Procedure Date:  05/13/2020. Aortic valve area, by VTI measures 2.05 cm. Aortic valve mean  gradient measures 5.0 mmHg. Aortic valve Vmax measures 1.59 m/s.  6. The inferior vena cava is normal in size with greater than 50%  respiratory variability, suggesting right atrial pressure of 3 mmHg.    Cardiac cath 02/13/20 Conclusion   Calcific aortic stenosis with mean aortic valve gradient 28 mmHg.  Aortic valve area 2.37 cm based upon dynamic expectedly high cardiac output of 10.3 L/min.  Widely patent coronary arteries.  LAD with eccentric 40 to 50% mid to distal narrowing..  Eccentric 50% mid RCA.  50% mid circumflex in the segment beyond the first obtuse marginal.  Normal right heart pressures.  Normal pulmonary capillary wedge pressure, mean 8 mmHg.  RECOMMENDATIONS:   Will forward information to Dr. Burt Knack. Coronary Diagrams  Diagnostic Dominance: Right      _____________   ASSESSMENT AND PLAN:  Acute on Chronic diastolic CHF/chronic lower leg edema Presents with significant 3+ pitting lower leg edema bilaterally. On 06/16/20 Lasix was increased from 58m BID to 40 mg BID. Appears weights are trending down (220lbs>210lbs) but still has extra volume. Weight in August 2021 was 201lbs which might be his dry weight. Previous notes also record chronic lower extremity edema so unsure how much is new. Lungs clear on exam and no JVD appreciated. O2 at 97%. He denies sob or chest pain. Echo showed LVEF 55-60%, mild LVH, G2DD. Check BMET today. I will increase lasix to 831mBID x 4 days and then back down to 40 mg BID. BMET in 1 week with close follow-up. He is on potassium, will adjust according to labs. Recommend low salt diet and compression stockings.   Severe AS with endocarditis s/p aortic AVR/root repair Saw Dr. OwRoxy Manns0/25/21 and was recovering well, notes some LLE and lasix was increased. Follow-up echo 11/2 showed normal EF with repaired aorta/aortic valve without stenosis or regurgitation. Following with ID and still on IV antibiotics.. Plan for dental procedure. Denies new fever or chills.  Nonobstructive CAD Cath as above. No chest pain. Continue Aspirin, statin, BB  HTN 128/86 today. Continue coreg. Lasix increased as above.  HLD Continue  atorvastatin. LDL 83 07/2019. Needs re-check labs eventually  H/o of GI bleed Hemoglobin at discharge 11.3. On Aspirin. Denies bleeding issues. No fatigue or sob.   Disposition:   FU with APP in 1 week   Signed, Corina Stacy H Ninfa MeekerPA-C 06/25/2020 7:40 PM    _____________ [id number]([phone]) [phone])

## 2020-06-26 ENCOUNTER — Ambulatory Visit (INDEPENDENT_AMBULATORY_CARE_PROVIDER_SITE_OTHER): Payer: Medicare Other | Admitting: Medical

## 2020-06-26 ENCOUNTER — Encounter: Payer: Self-pay | Admitting: Cardiology

## 2020-06-26 ENCOUNTER — Encounter: Payer: Self-pay | Admitting: Internal Medicine

## 2020-06-26 ENCOUNTER — Other Ambulatory Visit: Payer: Self-pay | Admitting: *Deleted

## 2020-06-26 ENCOUNTER — Other Ambulatory Visit: Payer: Self-pay

## 2020-06-26 ENCOUNTER — Ambulatory Visit (INDEPENDENT_AMBULATORY_CARE_PROVIDER_SITE_OTHER): Payer: Medicare Other | Admitting: Internal Medicine

## 2020-06-26 VITALS — BP 128/86 | HR 94 | Ht 67.0 in | Wt 210.2 lb

## 2020-06-26 DIAGNOSIS — I25709 Atherosclerosis of coronary artery bypass graft(s), unspecified, with unspecified angina pectoris: Secondary | ICD-10-CM

## 2020-06-26 DIAGNOSIS — D638 Anemia in other chronic diseases classified elsewhere: Secondary | ICD-10-CM

## 2020-06-26 DIAGNOSIS — I5031 Acute diastolic (congestive) heart failure: Secondary | ICD-10-CM | POA: Diagnosis not present

## 2020-06-26 DIAGNOSIS — E785 Hyperlipidemia, unspecified: Secondary | ICD-10-CM

## 2020-06-26 DIAGNOSIS — Z954 Presence of other heart-valve replacement: Secondary | ICD-10-CM | POA: Diagnosis not present

## 2020-06-26 DIAGNOSIS — I358 Other nonrheumatic aortic valve disorders: Secondary | ICD-10-CM

## 2020-06-26 DIAGNOSIS — I38 Endocarditis, valve unspecified: Secondary | ICD-10-CM | POA: Diagnosis not present

## 2020-06-26 DIAGNOSIS — R7881 Bacteremia: Secondary | ICD-10-CM | POA: Diagnosis not present

## 2020-06-26 DIAGNOSIS — M6281 Muscle weakness (generalized): Secondary | ICD-10-CM | POA: Diagnosis not present

## 2020-06-26 DIAGNOSIS — B955 Unspecified streptococcus as the cause of diseases classified elsewhere: Secondary | ICD-10-CM

## 2020-06-26 DIAGNOSIS — E559 Vitamin D deficiency, unspecified: Secondary | ICD-10-CM | POA: Diagnosis not present

## 2020-06-26 DIAGNOSIS — K089 Disorder of teeth and supporting structures, unspecified: Secondary | ICD-10-CM | POA: Insufficient documentation

## 2020-06-26 DIAGNOSIS — I330 Acute and subacute infective endocarditis: Secondary | ICD-10-CM | POA: Diagnosis not present

## 2020-06-26 DIAGNOSIS — I5033 Acute on chronic diastolic (congestive) heart failure: Secondary | ICD-10-CM | POA: Diagnosis not present

## 2020-06-26 DIAGNOSIS — I2510 Atherosclerotic heart disease of native coronary artery without angina pectoris: Secondary | ICD-10-CM | POA: Diagnosis not present

## 2020-06-26 DIAGNOSIS — R2689 Other abnormalities of gait and mobility: Secondary | ICD-10-CM | POA: Diagnosis not present

## 2020-06-26 DIAGNOSIS — I10 Essential (primary) hypertension: Secondary | ICD-10-CM

## 2020-06-26 DIAGNOSIS — I350 Nonrheumatic aortic (valve) stenosis: Secondary | ICD-10-CM | POA: Diagnosis not present

## 2020-06-26 MED ORDER — DEXTROSE 5 % IV SOLN: 1500.0000 mg | Freq: Once | INTRAVENOUS | Status: AC

## 2020-06-26 MED ORDER — DALBAVANCIN HCL 500 MG IV SOLR: 1000.0000 mg | Freq: Once | INTRAVENOUS | 0 refills | Status: DC

## 2020-06-26 NOTE — Assessment & Plan Note (Signed)
He is much improved following surgery and 6 weeks of antibiotic therapy for streptococcal aortic valve endocarditis.  I have considered all options for his antibiotic therapy.  I called Dr. Raynelle Dick office and was able to get him an appointment on Friday, 07/04/2020.  I have also arranged an outpatient dose of IV dalbavancin tomorrow and again on Monday, 07/07/2020.  He will follow-up here and 3 to 4 weeks.

## 2020-06-26 NOTE — Assessment & Plan Note (Signed)
I have arranged follow-up with Dr. Benson Norway on Friday, 07/04/2020.

## 2020-06-26 NOTE — Patient Outreach (Signed)
[facility] Post Acute Care Coordinator follow up. Member screened for potential [facility] Care Management needs as a benefit of [place] [employer].  Update received from [facility] indicating member will transition to home on 06/27/20. Inquiry sent to SNF SW to find out which home health agency was arranged.   Telephone call made to Mr. Bazen [phone] to discuss [facility] Management follow up. No answer. HIPAA compliant voicemail message left to request return call.   Will plan outreach again at later time.    Marthenia Rolling, MSN-Ed, RN,BSN [place] Acute Care Coordinator [phone] [facility]) [phone]  (Toll free office)

## 2020-06-26 NOTE — Addendum Note (Signed)
Addended by: Marciano Sequin on: 06/26/2020 04:04 PM   Modules accepted: Orders

## 2020-06-26 NOTE — Patient Instructions (Addendum)
You have an appointment to receive 1 dose of long-acting IV dalbavancin in the short stay unit at [facility] tomorrow, 06/27/2020 at 12 PM.  You have an appointment to see Dr. Frederik Schmidt on Friday, 07/04/2020 at 11:30 AM.  You have an appointment to receive a second dose of long-acting IV dalbavancin in the short stay unit at [facility] on Monday, 07/07/2020.

## 2020-06-26 NOTE — Assessment & Plan Note (Signed)
I believe that his bacteremia has been cured.

## 2020-06-26 NOTE — Patient Instructions (Signed)
Medication Instructions:  INCREASE FUROSEMIDE TO 80 MG TWICE DAILY FOR 4 DAYS THEN RESUME 40 MG TWICE A DAY ON 5 TH DAY  If you need a refill on your cardiac medications before your next appointment, please call your pharmacy*   Lab Work: BMET TODAY AND IN 1 WEEK  If you have labs (blood work) drawn today and your tests are completely normal, you will receive your results only by: Marland Kitchen MyChart Message (if you have MyChart) OR . A paper copy in the mail If you have any lab test that is abnormal or we need to change your treatment, we will call you to review the results.   Testing/Procedures: NONE   Follow-Up: At [facility], you and your health needs are our priority.  As part of our continuing mission to provide you with exceptional heart care, we have created designated Provider Care Teams.  These Care Teams include your primary Cardiologist (physician) and Advanced Practice Providers (APPs -  Physician Assistants and Nurse Practitioners) who all work together to provide you with the care you need, when you need it.  We recommend signing up for the patient portal called "MyChart".  Sign up information is provided on this After Visit Summary.  MyChart is used to connect with patients for Virtual Visits (Telemedicine).  Patients are able to view lab/test results, encounter notes, upcoming appointments, etc.  Non-urgent messages can be sent to your provider as well.   To learn more about what you can do with MyChart, go to [email or web address].    Your next appointment:   1 week(s)  The format for your next appointment:   In Person  Provider:   You will see one of the following Advanced Practice Providers on your designated Care Team:    Richardson Dopp, PA-C  Robbie Lis, [place]     Other Instructions Your physician recommends that you weigh, daily, at the same time every day, and in the same amount of clothing. Please record your daily weights on the handout provided and bring it  to your next appointment.    Low-Sodium Eating Plan Sodium, which is an element that makes up salt, helps you maintain a healthy balance of fluids in your body. Too much sodium can increase your blood pressure and cause fluid and waste to be held in your body. Your health care provider or dietitian may recommend following this plan if you have high blood pressure (hypertension), kidney disease, liver disease, or heart failure. Eating less sodium can help lower your blood pressure, reduce swelling, and protect your heart, liver, and kidneys. What are tips for following this plan? General guidelines  Most people on this plan should limit their sodium intake to 1,500-2,000 mg (milligrams) of sodium each day. Reading food labels   The Nutrition Facts label lists the amount of sodium in one serving of the food. If you eat more than one serving, you must multiply the listed amount of sodium by the number of servings.  Choose foods with less than 140 mg of sodium per serving.  Avoid foods with 300 mg of sodium or more per serving. Shopping  Look for lower-sodium products, often labeled as "low-sodium" or "no salt added."  Always check the sodium content even if foods are labeled as "unsalted" or "no salt added".  Buy fresh foods. ? Avoid canned foods and premade or frozen meals. ? Avoid canned, cured, or processed meats  Buy breads that have less than 80 mg of sodium per slice. Cooking  Eat more home-cooked food and less restaurant, buffet, and fast food.  Avoid adding salt when cooking. Use salt-free seasonings or herbs instead of table salt or sea salt. Check with your health care provider or pharmacist before using salt substitutes.  Cook with plant-based oils, such as canola, sunflower, or olive oil. Meal planning  When eating at a restaurant, ask that your food be prepared with less salt or no salt, if possible.  Avoid foods that contain MSG (monosodium glutamate). MSG is  sometimes added to [place] food, bouillon, and some canned foods. What foods are recommended? The items listed may not be a complete list. Talk with your dietitian about what dietary choices are best for you. Grains Low-sodium cereals, including oats, puffed wheat and rice, and shredded wheat. Low-sodium crackers. Unsalted rice. Unsalted pasta. Low-sodium bread. Whole-grain breads and whole-grain pasta. Vegetables Fresh or frozen vegetables. "No salt added" canned vegetables. "No salt added" tomato sauce and paste. Low-sodium or reduced-sodium tomato and vegetable juice. Fruits Fresh, frozen, or canned fruit. Fruit juice. Meats and other protein foods Fresh or frozen (no salt added) meat, poultry, seafood, and fish. Low-sodium canned tuna and salmon. Unsalted nuts. Dried peas, beans, and lentils without added salt. Unsalted canned beans. Eggs. Unsalted nut butters. Dairy Milk. Soy milk. Cheese that is naturally low in sodium, such as ricotta cheese, fresh mozzarella, or Swiss cheese Low-sodium or reduced-sodium cheese. Cream cheese. Yogurt. Fats and oils Unsalted butter. Unsalted margarine with no trans fat. Vegetable oils such as canola or olive oils. Seasonings and other foods Fresh and dried herbs and spices. Salt-free seasonings. Low-sodium mustard and ketchup. Sodium-free salad dressing. Sodium-free light mayonnaise. Fresh or refrigerated horseradish. Lemon juice. Vinegar. Homemade, reduced-sodium, or low-sodium soups. Unsalted popcorn and pretzels. Low-salt or salt-free chips. What foods are not recommended? The items listed may not be a complete list. Talk with your dietitian about what dietary choices are best for you. Grains Instant hot cereals. Bread stuffing, pancake, and biscuit mixes. Croutons. Seasoned rice or pasta mixes. Noodle soup cups. Boxed or frozen macaroni and cheese. Regular salted crackers. Self-rising flour. Vegetables Sauerkraut, pickled vegetables, and relishes.  Olives. [place] fries. Onion rings. Regular canned vegetables (not low-sodium or reduced-sodium). Regular canned tomato sauce and paste (not low-sodium or reduced-sodium). Regular tomato and vegetable juice (not low-sodium or reduced-sodium). Frozen vegetables in sauces. Meats and other protein foods Meat or fish that is salted, canned, smoked, spiced, or pickled. Bacon, ham, sausage, hotdogs, corned beef, chipped beef, packaged lunch meats, salt pork, jerky, pickled herring, anchovies, regular canned tuna, sardines, salted nuts. Dairy Processed cheese and cheese spreads. Cheese curds. Blue cheese. Feta cheese. String cheese. Regular cottage cheese. Buttermilk. Canned milk. Fats and oils Salted butter. Regular margarine. Ghee. Bacon fat. Seasonings and other foods Onion salt, garlic salt, seasoned salt, table salt, and sea salt. Canned and packaged gravies. Worcestershire sauce. Tartar sauce. Barbecue sauce. Teriyaki sauce. Soy sauce, including reduced-sodium. Steak sauce. Fish sauce. Oyster sauce. Cocktail sauce. Horseradish that you find on the shelf. Regular ketchup and mustard. Meat flavorings and tenderizers. Bouillon cubes. Hot sauce and Tabasco sauce. Premade or packaged marinades. Premade or packaged taco seasonings. Relishes. Regular salad dressings. Salsa. Potato and tortilla chips. Corn chips and puffs. Salted popcorn and pretzels. Canned or dried soups. Pizza. Frozen entrees and pot pies. Summary  Eating less sodium can help lower your blood pressure, reduce swelling, and protect your heart, liver, and kidneys.  Most people on this plan should limit their sodium intake to  1,500-2,000 mg (milligrams) of sodium each day.  Canned, boxed, and frozen foods are high in sodium. Restaurant foods, fast foods, and pizza are also very high in sodium. You also get sodium by adding salt to food.  Try to cook at home, eat more fresh fruits and vegetables, and eat less fast food, canned, processed, or  prepared foods. This information is not intended to replace advice given to you by your health care provider. Make sure you discuss any questions you have with your health care provider. Document Revised: 07/22/2017 Document Reviewed: 08/02/2016 Elsevier Patient Education  2020 [employer].

## 2020-06-26 NOTE — Progress Notes (Signed)
[facility]ious [facility]  Patient Active Problem List   Diagnosis Date Noted  . Abscess of aortic valve 05/14/2020    Priority: High  . Streptococcal bacteremia 05/13/2020    Priority: High  . Aortic valve endocarditis 05/13/2020    Priority: High  . Severe aortic insufficiency 05/13/2020    Priority: High  . Aortic stenosis     Priority: High  . Poor dentition 06/26/2020  . Streptococcal endocarditis 05/21/2020  . 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  . 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 History of GI bleed 05/14/2020  . Normocytic anemia 05/14/2020  . Endocarditis 05/14/2020  . 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 Acute CHF (congestive heart failure) ([place]) 05/13/2020  . Respiratory distress 05/13/2020  . Acute blood loss anemia 05/02/2020  . Malignant neoplasm of prostate ([place]) 06/23/2018  . Goals of care, counseling/discussion 06/23/2018  . Left inguinal hernia 09/04/2015  . Alcoholism ([place]) 06/10/2014  . Anemia of chronic disease 05/23/2014  . Elevated PSA 01/20/2012  . HEMORRHAGE OF RECTUM AND ANUS 12/15/2008  . ERECTILE DYSFUNCTION, MILD 12/14/2007  . SEBORRHEIC KERATOSIS, INFLAMED 08/03/2007  . NEOPLASM, SKIN, UNCERTAIN BEHAVIOR 81/38/8719  . Hypothyroidism 07/06/2007  . Diabetes mellitus ([place]) 07/06/2007  . Hyperlipidemia 07/06/2007  . Essential hypertension 07/06/2007    Patient's Medications  New Prescriptions   No medications on file  Previous Medications   ABIRATERONE ACETATE (ZYTIGA) 250 MG TABLET    TAKE 4 TABLETS (1,000 MG TOTAL) BY MOUTH DAILY. TAKE ON AN EMPTY STOMACH 1 HOUR BEFORE OR 2 HOURS AFTER A MEAL   ASPIRIN EC 325 MG EC TABLET    One tab po daily   ATORVASTATIN (LIPITOR) 40 MG TABLET    TAKE 1 TABLET EVERY DAY   CARVEDILOL (COREG) 25 MG TABLET    Take 1 tablet (25 mg total) by mouth 2 (two) times daily with a meal.   CEFTRIAXONE (ROCEPHIN) IVPB    Inject 2 g into  the vein daily. Indication:  endocarditis First Dose: Yes Last Day of Therapy:  06/26/2020 Labs - Once weekly:  CBC/D and BMP, Labs - Every other week:  ESR and CRP Method of administration: IV Push Method of administration may be changed at the discretion of home infusion pharmacist based upon assessment of the patient and/or caregiver's ability to self-administer the medication ordered.   CHOLECALCIFEROL (VITAMIN D3) 25 MCG (1000 UT) TABLET    Take 1,000 Units by mouth daily.   DOXAZOSIN (CARDURA) 8 MG TABLET    Take 1 tablet (8 mg total) by mouth at bedtime.   FERROUS SULFATE 325 (65 FE) MG EC TABLET    Take 1 tablet (325 mg total) by mouth 2 (two) times daily before a meal.   FOSINOPRIL (MONOPRIL) 40 MG TABLET       FUROSEMIDE (LASIX) 20 MG TABLET    Take 2 tablets (40 mg total) by mouth 2 (two) times daily.   GLIPIZIDE (GLUCOTROL XL) 10 MG 24 HR TABLET    TAKE 1 TABLET TWICE DAILY   LEVOTHYROXINE (SYNTHROID) 150 MCG TABLET    TAKE 1 TABLET EVERY DAY   METFORMIN (GLUCOPHAGE) 1000 MG TABLET    TAKE 1 TABLET TWICE DAILY WITH MEALS   OMEPRAZOLE (PRILOSEC) 40 MG CAPSULE    TAKE 1 CAPSULE BY MOUTH EVERY DAY   OXYCODONE (OXY IR/ROXICODONE) 5 MG IMMEDIATE RELEASE TABLET    Take 1 tablet (5  mg total) by mouth every 6 (six) hours as needed for severe pain.   POTASSIUM CHLORIDE SA (KLOR-CON) 10 MEQ TABLET    Take 2 tablets (20 mEq total) by mouth 2 (two) times daily. TAKE 1 TABLET TWICE DAILY   PREDNISONE (DELTASONE) 5 MG TABLET    TAKE 1 TABLET (5 MG TOTAL) BY MOUTH DAILY WITH BREAKFAST.  Modified Medications   No medications on file  Discontinued Medications   No medications on file    Subjective: Matthew Serrano is in for his hospital follow-up visit.  He was admitted to the hospital about 5 weeks ago with subacute aortic valve endocarditis.  Blood cultures were positive for strep anginosis.  He had a large aortic valve abscess and required aortic root replacement by Dr. Lilly Cove.  He was discharged  on IV ceftriaxone and has now completed 6 weeks of therapy.  He is feeling much better. He has not had any problems tolerating his PICC or ceftriaxone.  He is going to have further oral surgery by Dr. Frederik Schmidt in the near future but that surgery has not been scheduled yet.  Dr. Roxy Manns prefers that he remain on IV antibiotics until he is completed his dental work.  Unfortunately he is exhausted his days in a skilled nursing facility and will be discharged this afternoon.  Review of Systems: Review of Systems  Constitutional: Negative for chills, diaphoresis and fever.  Respiratory: Negative for cough and shortness of breath.   Cardiovascular: Positive for chest pain.  Gastrointestinal: Negative for abdominal pain, diarrhea, nausea and vomiting.  Musculoskeletal: Negative for back pain and joint pain.    Past Medical History:  Diagnosis Date  . Aortic valve endocarditis 05/13/2020   STREPTOCOCCUS ANGINOSIS  . AVM (arteriovenous malformation) of colon   . Cancer [facility])    prostate cancer  . DIABETES MELLITUS, TYPE II 07/06/2007  . ED (erectile dysfunction)   . Elevated PSA 01/20/2012  . 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 History of nuclear stress test    Myoview 8/18: EF 64, inferobasal thinning, no ischemia, low risk  . HYPERLIPIDEMIA 07/06/2007  . HYPERTENSION 07/06/2007  . HYPOTHYROIDISM 07/06/2007  . Leg pain    ABIs 8/18:  Normal   . Obesity   . 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 Severe aortic insufficiency 05/13/2020   acute onset in setting of bacterial endocarditis  . Severe aortic stenosis     Social History   Tobacco Use  . Smoking status: Former Research scientist (life sciences)  . Smokeless tobacco: Never Used  . Tobacco comment: quit 2005  Vaping Use  . Vaping Use: Never used  Substance Use Topics   . Alcohol use: Not Currently    Alcohol/week: 21.0 standard drinks    Types: 21 Cans of beer per week  . Drug use: No    Family History  Problem Relation Age of Onset  . Hypertension Other   . Breast cancer Sister   . Breast cancer Other        family history  . Breast cancer Brother        prostate ca  . Aneurysm Mother        Brain aneurysm   . Prostate cancer Father   . Colon cancer Neg Hx   . Esophageal cancer Neg Hx   .  Stomach cancer Neg Hx   . Rectal cancer Neg Hx     No Known Allergies  Objective: Vitals:   06/26/20 1047  BP: (!) 161/101  Pulse: 88  Temp: 98.2 F (36.8 C)  TempSrc: Oral  SpO2: 100%  Weight: 210 lb (95.3 kg)  Height: _0  (1.702 m)   Body mass index is 32.89 kg/m.  Physical Exam Constitutional:      Comments: He is in good spirits.  Cardiovascular:     Rate and Rhythm: Normal rate.     Heart sounds: No murmur heard.   Pulmonary:     Effort: Pulmonary effort is normal.     Breath sounds: Normal breath sounds.  Chest:    Abdominal:     Palpations: Abdomen is soft.     Tenderness: There is no abdominal tenderness.  Psychiatric:        Mood and Affect: Mood normal.       Problem List Items Addressed This Visit      High   Streptococcal bacteremia    I believe that his bacteremia has been cured.      Aortic valve endocarditis    He is much improved following surgery and 6 weeks of antibiotic therapy for streptococcal aortic valve endocarditis.  I have considered all options for his antibiotic therapy.  I called Dr. Raynelle Dick office and was able to get him an appointment on Friday, 07/04/2020.  I have also arranged an outpatient dose of IV dalbavancin tomorrow and again on Monday, 07/07/2020.  He will follow-up here and 3 to 4 weeks.      Relevant Medications   fosinopril (MONOPRIL) 40 MG tablet     Unprioritized   Poor dentition    I have arranged follow-up with Dr. Benson Norway on Friday, 07/04/2020.          Michel Bickers, MD [facility] [place] [facility] [phone] pager   [phone] cell 06/26/2020, 12:13 PM

## 2020-06-27 ENCOUNTER — Encounter (HOSPITAL_COMMUNITY)
Admission: RE | Admit: 2020-06-27 | Discharge: 2020-06-27 | Disposition: A | Discharge status: Home / Self Care | Payer: Medicare Other | Source: Ambulatory Visit | Attending: Internal Medicine | Admitting: Internal Medicine

## 2020-06-27 ENCOUNTER — Other Ambulatory Visit (HOSPITAL_COMMUNITY): Payer: Self-pay | Admitting: *Deleted

## 2020-06-27 DIAGNOSIS — I330 Acute and subacute infective endocarditis: Secondary | ICD-10-CM | POA: Diagnosis not present

## 2020-06-27 DIAGNOSIS — B955 Unspecified streptococcus as the cause of diseases classified elsewhere: Secondary | ICD-10-CM | POA: Diagnosis not present

## 2020-06-27 LAB — BASIC METABOLIC PANEL
BUN/Creatinine Ratio: 10 (ref 10–24)
BUN: 8 mg/dL (ref 8–27)
CO2: 28 mmol/L (ref 20–29)
Calcium: 9 mg/dL (ref 8.6–10.2)
Chloride: 103 mmol/L (ref 96–106)
Creatinine, Ser: 0.79 mg/dL (ref 0.76–1.27)
GFR calc Af Amer: 104 mL/min/{1.73_m2} (ref >59)
GFR calc non Af Amer: 90 mL/min/{1.73_m2} (ref >59)
Glucose: 86 mg/dL (ref 65–99)
Potassium: 3.9 mmol/L (ref 3.5–5.2)
Sodium: 146 mmol/L — ABNORMAL HIGH (ref 134–144)

## 2020-06-27 MED ORDER — DEXTROSE 5 % IV SOLN
1500.0000 mg | Freq: Once | INTRAVENOUS | Status: AC
  Administered 2020-06-27: 1500 mg via INTRAVENOUS
  Filled 2020-06-27: qty 75

## 2020-06-27 NOTE — Progress Notes (Signed)
Right AC site where PICC line was DC WNL and pt DC home

## 2020-06-29 NOTE — Progress Notes (Addendum)
Cardiology Office Note:    Date:  06/30/2020   ID:  Matthew Mcardle., DOB [birth year], MRN [id number]  PCP:  [name], NP  [facility] Cardiologist:  [name], MD   [place] Electrophysiologist:  None   Referring MD: [name], NP   Chief Complaint:  Follow-up (CHF)    Patient Profile:    Matthew Mcbreen. is a 74 y.o. male with:   Aortic stenosis  Severe 01/2020 >> planned for AVR but pt developed SBE w/ severe AI  AV bacterial endocarditis >> severe AI  S/p AVR, aortic root replacement and 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  Heart failure with preserved ejection fraction   Coronary artery disease   Cath 6/21: mod non-obs CAD   Diabetes mellitus   Hypertension   Hyperlipidemia  Hypothyroidism   Erectile dysfunction  Metastatic prostate CA on suppression chemotherapy   Hx of LGI bleed 01/2020  Colo w cecal telangiectasias >> treated w APC  S/p PRBC transfusions    Prior CV studies:  Echocardiogram 06/24/2020 EF 55-60, mild conc LVH, Gr 2 DD, normal RVSF, mild LAE, trivial MR, AVR functioning normally (mean gradient 5 mmHg)  Cardiac catheterization 02/13/20 LAD mid 50 LCx prox 50 RCA mid 45  Carotid US 02/20/20 Bilat ICA 1-39  ABIs 8/18:  Normal  Nuclear stress test 03/25/17 Probably normal, low risk stress nuclear study with inferobasal thinning but no significant ischemia; EF 64 with normal wall motion.  Abdominal MRA 8/13 1. Negative for renal artery stenosis or other lesion to suggest renovascular component of hypertension.  Renal Art Duplex 6/13 No RAS; normal caliber abd aorta   History of Present Illness:    Matthew Serrano was noted to have severe AS in 01/2020 and was seen by Dr. Burt Knack and evaluated for AVR.  After consultation with the multidisciplinary heart valve clinic, he was referred for dental extractions.  However, prior to this, he was admitted in 04/2020 with LGI bleeding.  Colonoscopy  demonstrated cecal telangiectasias treated with APC.  He required transfusion with PRBCs.  He was then admitted 9/21-10/1 with AV bacterial endocarditis w/ severe AI.  TEE confirmed with severe AI with destroyed R coronary leaflet and what appeared to be a new fistula between the aortic root and RVOT with severe L to R shunt.  He was referred for surgical intervention and underwent AVR with repair of the aorta to pulmonary artery fistula, aortic root replacement and reimplantation of the coronary arteries.   His cultures returned + for Strep anginosis and he was DC on Ceftriaxone for 4 weeks.  He was DC to SNF.  He was noted to be volume overloaded late last month at his visit with Dr. Roxy Manns and his furosemide was increased.  A follow up echocardiogram showed normal EF and normally functioning AVR.  He was seen Cadence Kathlen Mody, PA-C last week and his Furosemide was increased further to 80 mg twice daily for 4 days, then back to 40 mg twice daily.  Of note, the patient is awaiting dental extractions before he finishes his antibiotics.    He returns for follow up.  He is here alone.  He feels his swelling is improved.  He has not had significant shortness of breath.  His chest is still somewhat sore but this is improved.  He has not had orthopnea, syncope.  He does note some swelling around the head of his penis but no scrotal swelling.  He is uncircumcised.  He  has occasional pain.  He was evaluated by Dr. Donnetta Hutching for L leg swelling in 1/21.  Venous dopplers were neg.  He was thought to have lymphedema at that time.  His albumin was 1.9 in 04/2020.      Past Medical History:  Diagnosis Date  . Aortic valve endocarditis 05/13/2020   STREPTOCOCCUS ANGINOSIS  . AVM (arteriovenous malformation) of colon   . Cancer [facility])    prostate cancer  . DIABETES MELLITUS, TYPE II 07/06/2007  . ED (erectile dysfunction)   . Elevated PSA 01/20/2012  . 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 History of  nuclear stress test    Myoview 8/18: EF 64, inferobasal thinning, no ischemia, low risk  . HYPERLIPIDEMIA 07/06/2007  . HYPERTENSION 07/06/2007  . HYPOTHYROIDISM 07/06/2007  . Leg pain    ABIs 8/18:  Normal   . Obesity   . 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 Severe aortic insufficiency 05/13/2020   acute onset in setting of bacterial endocarditis  . Severe aortic stenosis     Current Medications: Current Meds  Medication Sig  . abiraterone acetate (ZYTIGA) 250 MG tablet TAKE 4 TABLETS (1,000 MG TOTAL) BY MOUTH DAILY. TAKE ON AN EMPTY STOMACH 1 HOUR BEFORE OR 2 HOURS AFTER A MEAL  . aspirin EC 325 MG EC tablet One tab po daily  . atorvastatin (LIPITOR) 40 MG tablet TAKE 1 TABLET EVERY DAY  . cholecalciferol (VITAMIN D3) 25 MCG (1000 UT) tablet Take 1,000 Units by mouth daily.  Marland Kitchen doxazosin (CARDURA) 8 MG tablet Take 1 tablet (8 mg total) by mouth at bedtime.  . ferrous sulfate 325 (65 FE) MG EC tablet Take 1 tablet (325 mg total) by mouth 2 (two) times daily before a meal.  . glipiZIDE (GLUCOTROL XL) 10 MG 24 hr tablet TAKE 1 TABLET TWICE DAILY  . labetalol (NORMODYNE) 200 MG tablet Take 3 tablets by mouth in the morning and 2 tablets by mouth in the evening  . levothyroxine (SYNTHROID) 150 MCG tablet TAKE 1 TABLET EVERY DAY  . metFORMIN (GLUCOPHAGE) 1000 MG tablet TAKE 1 TABLET TWICE DAILY WITH MEALS  . omeprazole (PRILOSEC) 40 MG capsule TAKE 1 CAPSULE BY MOUTH EVERY DAY  . potassium chloride (KLOR-CON) 20 MEQ tablet Take 1 tablet (20 mEq total) by mouth 2 (two) times daily.  . predniSONE (DELTASONE) 5 MG tablet TAKE 1 TABLET (5 MG TOTAL) BY MOUTH DAILY WITH BREAKFAST.  . [DISCONTINUED] furosemide (LASIX) 20 MG tablet Take 2 tablets (40 mg total) by mouth 2 (two) times daily.  . [DISCONTINUED] potassium chloride  (KLOR-CON) 10 MEQ tablet Take 10 mEq by mouth 2 (two) times daily.     Allergies:   Patient has no known allergies.   Social History   Tobacco Use  . Smoking status: Former Research scientist (life sciences)  . Smokeless tobacco: Never Used  . Tobacco comment: quit 2005  Vaping Use  . Vaping Use: Never used  Substance Use Topics  . Alcohol use: Not Currently    Alcohol/week: 21.0 standard drinks    Types: 21 Cans of beer per week  . Drug use: No     Family Hx: The patient's family history includes Aneurysm in his mother; Breast cancer in his brother, sister, and another family member; Hypertension in an other family member; Prostate cancer in  his father. There is no history of Colon cancer, Esophageal cancer, Stomach cancer, or Rectal cancer.  ROS   EKGs/Labs/Other Test Reviewed:    EKG:  EKG is not ordered today.  The ekg ordered today demonstrates n/a  Recent Labs: 07/26/2019: TSH 1.92 05/13/2020: B Natriuretic Peptide 484.0 05/14/2020: ALT 30 05/16/2020: Magnesium 2.1 05/20/2020: Hemoglobin 11.3; Platelets 274 06/26/2020: BUN 8; Creatinine, Ser 0.79; Potassium 3.9; Sodium 146   Recent Lipid Panel Lab Results  Component Value Date/Time   CHOL 154 07/26/2019 09:21 AM   TRIG 113.0 07/26/2019 09:21 AM   HDL 48.20 07/26/2019 09:21 AM   CHOLHDL 3 07/26/2019 09:21 AM   LDLCALC 83 07/26/2019 09:21 AM   LDLDIRECT 105.0 04/28/2016 07:56 AM     Risk Assessment/Calculations:      Physical Exam:    VS:  BP 112/78   Pulse 77   Ht 5\' 7"  (1.702 m)   Wt 199 lb 3.2 oz (90.4 kg)   SpO2 97%   BMI 31.20 kg/m     Wt Readings from Last 3 Encounters:  06/30/20 199 lb 3.2 oz (90.4 kg)  06/27/20 210 lb (95.3 kg)  06/26/20 210 lb 3.2 oz (95.3 kg)     Constitutional:      Appearance: Healthy appearance. Not in distress.  Neck:     Vascular: JVD normal.  Pulmonary:     Effort: Pulmonary effort is normal.     Breath sounds: No wheezing. No rales.  Cardiovascular:     Normal rate. Regular rhythm.  Normal S1. Normal S2.     Murmurs: There is a grade 2/6 systolic murmur at the URSB and ULSB.  Edema:    Peripheral edema present.    Pretibial: bilateral 3+ pitting edema of the pretibial area.    Ankle: bilateral 2+ edema of the ankle with pitting on the right with no pitting on the left. Abdominal:     Palpations: Abdomen is soft.     Comments: No edema  Musculoskeletal:     Comments: No presacral edema Skin:    General: Skin is warm and dry.  Genitourinary:    Penis: Phimosis present.      Comments: No scrotal edema Neurological:     General: No focal deficit present.     Mental Status: Alert and oriented to person, place and time.     Cranial Nerves: Cranial nerves are intact.       ASSESSMENT & PLAN:    1. Chronic heart failure with preserved ejection fraction ([place]) 2. Bilateral leg edema His leg edema is likely multifactorial and related to volume overload in the setting of HFpEF as well as lymphedema and hypoalbuminemia.  Overall, his neck veins are flat and his lungs are clear.  He has no thigh edema or other evidence of R heart failure.  His albumin was as low as 1.9 in 04/2020.  He may do better with Torsemide as it is more bioavailable.  He is comfortable switching from Furosemide to Torsemide.  I doubt he has a DVT. However, he was hospitalized and then in SNF and he has an underlying malignancy.    -CMET, CBC  -Bilat venous US to r/o DVT  -DC Furosemide  -Start Torsemide 20 mg twice daily   -Increase K+ to 20 mEq twice daily   -BMET 1 week  -Keep legs elevated  -If venous US neg, he can wear compression stockings.   -FU in 3-4 weeks   3. Aortic valve endocarditis 4. S/P  aortic root replacement with human allograft S/p aortic root replacement and AVR.  He remains on antibiotics.  His most recent echocardiogram demonstrated normally functioning AVR.  He has FU with Dr. Roxy Manns 11/22.  I will make sure he has follow up with Dr. Burt Knack after we see him back for his leg  edema.   5. Coronary artery disease involving coronary bypass graft of native heart with angina pectoris ([place]) Mod non-obs CAD by cath prior to AVR.  He is not having angina.  Continue ASA, statin.   6. Essential hypertension The patient's blood pressure is controlled on his current regimen.  Continue current therapy.    7. Phimosis He has noted swelling of the foreskin that may represent phimosis or balanitis.  I have asked him to call his urologist ASAP to get in for evaluation.     Dispo:  Return in about 2 weeks (around 07/14/2020) for Close Follow Up, w/ Dr. Burt Knack, or Richardson Dopp, PA-C, in person.   Medication Adjustments/Labs and Tests Ordered: Current medicines are reviewed at length with the patient today.  Concerns regarding medicines are outlined above.  Tests Ordered: Orders Placed This Encounter  Procedures  . Comprehensive metabolic panel  . CBC  . Basic metabolic panel  . VAS [place] LOWER EXTREMITY VENOUS (DVT)   Medication Changes: Meds ordered this encounter  Medications  . torsemide (DEMADEX) 20 MG tablet    Sig: Take 1 tablet (20 mg total) by mouth 2 (two) times daily.    Dispense:  180 tablet    Refill:  3  . potassium chloride (KLOR-CON) 20 MEQ tablet    Sig: Take 1 tablet (20 mEq total) by mouth 2 (two) times daily.    Dispense:  180 tablet    Refill:  3    Signed, Richardson Dopp, PA-C  06/30/2020 4:39 PM    [facility] [place], [place], [place]  [address] Phone: [phone]; Fax: [phone]   ADDENDUM 07/03/2020 1:07 PM  The pt needs dental extractions.  His procedure is tomorrow with Dr. Benson Norway under conscious sedation.  I reviewed his current antibiotic regimen with ID.  He is currently taking adequate endocarditis prophylaxis.  I talked to the pt by phone today and he is overall stable with improved leg edema.  Recent K+ was low but repeat labs today demonstrate normal K+.   His risk of perioperative MACE (Major  Adverse Cardiac Event) is low at 0.9% according to the RCRI (Revised Cardiac Risk Index).  Therefore, he may proceed with his dental extraction at acceptable risk. Richardson Dopp, PA-C    07/03/2020 1:12 PM

## 2020-06-30 ENCOUNTER — Other Ambulatory Visit: Payer: Self-pay

## 2020-06-30 ENCOUNTER — Ambulatory Visit (INDEPENDENT_AMBULATORY_CARE_PROVIDER_SITE_OTHER): Payer: Medicare Other | Admitting: Physician Assistant

## 2020-06-30 ENCOUNTER — Other Ambulatory Visit: Payer: Self-pay | Admitting: *Deleted

## 2020-06-30 ENCOUNTER — Encounter: Payer: Self-pay | Admitting: Physician Assistant

## 2020-06-30 VITALS — BP 112/78 | HR 77 | Ht 67.0 in | Wt 199.2 lb

## 2020-06-30 DIAGNOSIS — I10 Essential (primary) hypertension: Secondary | ICD-10-CM | POA: Diagnosis not present

## 2020-06-30 DIAGNOSIS — R600 Localized edema: Secondary | ICD-10-CM

## 2020-06-30 DIAGNOSIS — I358 Other nonrheumatic aortic valve disorders: Secondary | ICD-10-CM | POA: Diagnosis not present

## 2020-06-30 DIAGNOSIS — I25709 Atherosclerosis of coronary artery bypass graft(s), unspecified, with unspecified angina pectoris: Secondary | ICD-10-CM | POA: Diagnosis not present

## 2020-06-30 DIAGNOSIS — N471 Phimosis: Secondary | ICD-10-CM | POA: Diagnosis not present

## 2020-06-30 DIAGNOSIS — Z954 Presence of other heart-valve replacement: Secondary | ICD-10-CM

## 2020-06-30 DIAGNOSIS — I5032 Chronic diastolic (congestive) heart failure: Secondary | ICD-10-CM

## 2020-06-30 MED ORDER — POTASSIUM CHLORIDE CRYS ER 20 MEQ PO TBCR
20.0000 meq | EXTENDED_RELEASE_TABLET | Freq: Two times a day (BID) | ORAL | 3 refills | Status: DC
Start: 2020-06-30 — End: 2020-07-01

## 2020-06-30 MED ORDER — TORSEMIDE 20 MG PO TABS: 20.0000 mg | ORAL_TABLET | Freq: Two times a day (BID) | ORAL | 3 refills | Status: DC

## 2020-06-30 NOTE — Patient Instructions (Addendum)
Medication Instructions:  Your physician has recommended you make the following change in your medication:   STOP: Furosemide START: Torsemide 20mg  twice daily INCREASE: Potassium 45meq twice daily  *If you need a refill on your cardiac medications before your next appointment, please call your pharmacy*   Lab Work: TODAY: CMET, CBC 07/07/2020: BMET  If you have labs (blood work) drawn today and your tests are completely normal, you will receive your results only by: Marland Kitchen MyChart Message (if you have MyChart) OR . A paper copy in the mail If you have any lab test that is abnormal or we need to change your treatment, we will call you to review the results.   Testing/Procedures: Your physician has requested that you have a lower extremity venous duplex. This test is an ultrasound of the veins in the legs. It looks at venous blood flow that carries blood from the heart to the legs. Allow one hour for a Lower Venous exam. There are no restrictions or special instructions.  Follow-Up: At [facility], you and your health needs are our priority.  As part of our continuing mission to provide you with exceptional heart care, we have created designated Provider Care Teams.  These Care Teams include your primary Cardiologist (physician) and Advanced Practice Providers (APPs -  Physician Assistants and Nurse Practitioners) who all work together to provide you with the care you need, when you need it.  We recommend signing up for the patient portal called "MyChart".  Sign up information is provided on this After Visit Summary.  MyChart is used to connect with patients for Virtual Visits (Telemedicine).  Patients are able to view lab/test results, encounter notes, upcoming appointments, etc.  Non-urgent messages can be sent to your provider as well.   To learn more about what you can do with MyChart, go to [email or web address].    Your next appointment:   07/28/2020 @ [facility] location  The  format for your next appointment:   In Person  Provider:   Richardson Dopp, PA-C   Other Instructions Please call your urologist this afternoon to get an appointment tomorrow for evaluation. I think you either have Phimosis or Balanitis  **Keep legs elevated when seated**

## 2020-06-30 NOTE — Patient Outreach (Addendum)
[facility] Post- Acute Care Coordinator follow up. Member screened for potential [facility] Care Management needs as a benefit of [place] [employer].  Mr. Matthew Serrano transitioned from [facility] on 06/27/2020. Telephone call made to Mr. Matthew Serrano [phone]. Patient identifiers confirmed.   Mr. Matthew Serrano reports he is doing fairly well. States he did not have home health for PT arranged because therapy was no longer needed. However, [place] indicated member will have [facility] for nursing.  Explained [place] [facility] Management services. Mr. Matthew Serrano Matthew Serrano. States his brother and "friend" assists with transportation to MD appointments. Denies having needs for meals or transportation.   Mr. Matthew Serrano has medical history of CHF, severe aortic stenosis, hypertension, hyperlipidemia, diabetes mellitus, prostate cancer.  Mr. Matthew Serrano is agreeable to [facility] RNCM referral for complex case management. Confirms best contact number is [phone].  Referral made for [facility] RNCM follow up.     Marthenia Rolling, MSN-Ed, RN,BSN [place] Acute Care Coordinator [phone] [facility]) [phone]  (Toll free office)

## 2020-07-01 ENCOUNTER — Ambulatory Visit (HOSPITAL_COMMUNITY)
Admission: RE | Admit: 2020-07-01 | Discharge: 2020-07-01 | Disposition: A | Discharge status: Home / Self Care | Payer: Medicare Other | Source: Ambulatory Visit | Attending: Physician Assistant | Admitting: Physician Assistant

## 2020-07-01 ENCOUNTER — Telehealth: Payer: Self-pay | Admitting: Physician Assistant

## 2020-07-01 ENCOUNTER — Other Ambulatory Visit: Payer: Self-pay | Admitting: *Deleted

## 2020-07-01 DIAGNOSIS — R600 Localized edema: Secondary | ICD-10-CM | POA: Diagnosis not present

## 2020-07-01 DIAGNOSIS — E876 Hypokalemia: Secondary | ICD-10-CM

## 2020-07-01 DIAGNOSIS — D649 Anemia, unspecified: Secondary | ICD-10-CM

## 2020-07-01 LAB — COMPREHENSIVE METABOLIC PANEL
ALT: 10 IU/L (ref 0–44)
AST: 8 IU/L (ref 0–40)
Albumin/Globulin Ratio: 1.5 (ref 1.2–2.2)
Albumin: 3.7 g/dL (ref 3.7–4.7)
Alkaline Phosphatase: 87 IU/L (ref 44–121)
BUN/Creatinine Ratio: 12 (ref 10–24)
BUN: 11 mg/dL (ref 8–27)
Bilirubin Total: 0.6 mg/dL (ref 0.0–1.2)
CO2: 29 mmol/L (ref 20–29)
Calcium: 8.8 mg/dL (ref 8.6–10.2)
Chloride: 98 mmol/L (ref 96–106)
Creatinine, Ser: 0.92 mg/dL (ref 0.76–1.27)
GFR calc Af Amer: 96 mL/min/{1.73_m2} (ref >59)
GFR calc non Af Amer: 83 mL/min/{1.73_m2} (ref >59)
Globulin, Total: 2.5 g/dL (ref 1.5–4.5)
Glucose: 126 mg/dL — ABNORMAL HIGH (ref 65–99)
Potassium: 2.9 mmol/L — CL (ref 3.5–5.2)
Sodium: 142 mmol/L (ref 134–144)
Total Protein: 6.2 g/dL (ref 6.0–8.5)

## 2020-07-01 LAB — CBC
Hematocrit: 32.1 % — ABNORMAL LOW (ref 37.5–51.0)
Hemoglobin: 10.1 g/dL — ABNORMAL LOW (ref 13.0–17.7)
MCH: 27.5 pg (ref 26.6–33.0)
MCHC: 31.5 g/dL (ref 31.5–35.7)
MCV: 88 fL (ref 79–97)
Platelets: 267 10*3/uL (ref 150–450)
RBC: 3.67 x10E6/uL — ABNORMAL LOW (ref 4.14–5.80)
RDW: 14.8 % (ref 11.6–15.4)
WBC: 7.8 10*3/uL (ref 3.4–10.8)

## 2020-07-01 MED ORDER — POTASSIUM CHLORIDE CRYS ER 20 MEQ PO TBCR
40.0000 meq | EXTENDED_RELEASE_TABLET | Freq: Two times a day (BID) | ORAL | 3 refills | Status: DC
Start: 2020-07-01 — End: 2022-01-27

## 2020-07-01 NOTE — Telephone Encounter (Signed)
     Pt is returning Anne's call to get lab result

## 2020-07-01 NOTE — Telephone Encounter (Signed)
New Message:    Patience from  [facility] is calling to report a  Critical Lab.

## 2020-07-01 NOTE — Telephone Encounter (Signed)
[facility] calling about potassium 2.9. See result note for further details.

## 2020-07-01 NOTE — Patient Outreach (Signed)
[facility]) Care Management  07/01/2020  Lenorris Karger. 20-Feb-1946 [id number]  Initial telephone outreach for complex care management, Unsuccessful, left message and requested a return call.  [name]. [name], MSN, [facility] Gerontological Nurse Practitioner [facility] Care Management [phone]

## 2020-07-01 NOTE — Telephone Encounter (Signed)
Spoke with pt and made him aware of results and recommendations.  Pt agreeable to increase meds and labs on Thursday.  He is aware to keep lab appt on the 15th as well.  Pt appreciative for call.

## 2020-07-02 ENCOUNTER — Other Ambulatory Visit: Payer: Self-pay | Admitting: *Deleted

## 2020-07-02 ENCOUNTER — Encounter: Payer: Self-pay | Admitting: *Deleted

## 2020-07-02 NOTE — Patient Outreach (Addendum)
[facility]) Care Management  07/02/2020  Matthew Serrano. 1946/07/16 [id number]  New referral, pt is post op aortic valve replacement, and SNF stay, home several days.  Chronic Med Hx includes: HF, HTN, Hyperlipidemia, DM, Hx Prostate Ca, Anemia  Matthew Serrano returned my call today and agrees to participate for care management.  He is doing quite well. He has no pain, is healing well, up and about, able to do all his ADL, IADLs except for driving. He denies SOB, he does have some minor peripheral edema.  Next cardiology appt is 07/14/20  Diabetes has had improved glycemic control, last HgbA1C was 6.8. Diet review revealed he does not eat enough fruit and vegetables and does eat Pizza and [employer] burgers. He checks his glucose at least once a day and sometimes bid. Levels in am are always <120 and pm's <160. Pt's wt has come down quite a bit, current wt is 185 and he is 5'7", BMI 28.97.  Patient was recently discharged from hospital and all medications have been reviewed. Outpatient Encounter Medications as of 07/02/2020  Medication Sig  . abiraterone acetate (ZYTIGA) 250 MG tablet TAKE 4 TABLETS (1,000 MG TOTAL) BY MOUTH DAILY. TAKE ON AN EMPTY STOMACH 1 HOUR BEFORE OR 2 HOURS AFTER A MEAL  . aspirin EC 81 MG tablet Take 81 mg by mouth daily. Swallow whole.  Marland Kitchen atorvastatin (LIPITOR) 40 MG tablet TAKE 1 TABLET EVERY DAY  . cholecalciferol (VITAMIN D3) 25 MCG (1000 UT) tablet Take 1,000 Units by mouth daily.  Marland Kitchen doxazosin (CARDURA) 8 MG tablet Take 1 tablet (8 mg total) by mouth at bedtime.  . ferrous sulfate 325 (65 FE) MG EC tablet Take 1 tablet (325 mg total) by mouth 2 (two) times daily before a meal.  . glipiZIDE (GLUCOTROL XL) 10 MG 24 hr tablet TAKE 1 TABLET TWICE DAILY  . labetalol (NORMODYNE) 200 MG tablet Take 3 tablets by mouth in the morning and 2 tablets by mouth in the evening  . levothyroxine (SYNTHROID) 150 MCG tablet TAKE 1 TABLET EVERY DAY  . metFORMIN  (GLUCOPHAGE) 1000 MG tablet TAKE 1 TABLET TWICE DAILY WITH MEALS  . omeprazole (PRILOSEC) 40 MG capsule TAKE 1 CAPSULE BY MOUTH EVERY DAY  . potassium chloride SA (KLOR-CON) 20 MEQ tablet Take 2 tablets (40 mEq total) by mouth 2 (two) times daily.  . predniSONE (DELTASONE) 5 MG tablet TAKE 1 TABLET (5 MG TOTAL) BY MOUTH DAILY WITH BREAKFAST.  Marland Kitchen torsemide (DEMADEX) 20 MG tablet Take 1 tablet (20 mg total) by mouth 2 (two) times daily.  . [DISCONTINUED] aspirin EC 325 MG EC tablet One tab po daily   No facility-administered encounter medications on file as of 07/02/2020.   Fall Risk  07/02/2020 06/26/2020 05/24/2018 09/04/2015 11/13/2013  Falls in the past year? 0 0 No No No  Number falls in past yr: 0 - - - -  Injury with Fall? 0 - - - -  Risk for fall due to : Medication side effect No Fall Risks - - -  Follow up Falls evaluation completed Falls evaluation completed - - -   Depression screen [facility] 2/9 07/02/2020 06/26/2020 05/24/2018 09/04/2015 11/13/2013  Decreased Interest 0 0 0 0 0  Down, Depressed, Hopeless 0 0 0 0 1  PHQ - 2 Score 0 0 0 0 1  Some recent data might be hidden   ASSESSMENT: Care management will focus on DM and HF management.  Goals    . Monitor  and Manage My Blood Sugar     Follow Up Date 08/06/20    - check blood sugar at prescribed times - check blood sugar before and after exercise - check blood sugar if I feel it is too high or too low - enter blood sugar readings and medication or insulin into daily log - take the blood sugar log to all doctor visits - take the blood sugar meter to all doctor visits    Why is this important?   Checking your blood sugar at home helps to keep it from getting very high or very low.  Writing the results in a diary or log helps the doctor know how to care for you.  Your blood sugar log should have the time, date and the results.  Also, write down the amount of insulin or other medicine that you take.  Other information, like what you  ate, exercise done and how you were feeling, will also be helpful.     Notes:     . Obtain Eye Exam     Follow Up Date 08/06/20  - keep appointment with eye doctor - schedule appointment with eye doctor    Why is this important?   Eye check-ups are important when you have diabetes.  Vision loss can be prevented.    Notes: Assess if has had his eye exam in last year.    . Record weight daily     Pt to weigh daily and record.    . Weight (lb) < 175 lb (79.4 kg)     Will learn carb counting to help lose wt and improve DM management.       PLAN: Will provide care management calendar for tracking glucose and wts (DM/HF)            Will provide education on carb counting.  We will talk again next week.  [name]. [name], MSN, [facility] Gerontological Nurse Practitioner [facility] Care Management [phone]

## 2020-07-03 ENCOUNTER — Telehealth: Payer: Self-pay

## 2020-07-03 ENCOUNTER — Other Ambulatory Visit: Payer: Self-pay

## 2020-07-03 ENCOUNTER — Other Ambulatory Visit: Payer: Medicare Other | Admitting: *Deleted

## 2020-07-03 DIAGNOSIS — E876 Hypokalemia: Secondary | ICD-10-CM | POA: Diagnosis not present

## 2020-07-03 LAB — BASIC METABOLIC PANEL
BUN/Creatinine Ratio: 14 (ref 10–24)
BUN: 12 mg/dL (ref 8–27)
CO2: 31 mmol/L — ABNORMAL HIGH (ref 20–29)
Calcium: 9.3 mg/dL (ref 8.6–10.2)
Chloride: 100 mmol/L (ref 96–106)
Creatinine, Ser: 0.88 mg/dL (ref 0.76–1.27)
GFR calc Af Amer: 99 mL/min/{1.73_m2} (ref >59)
GFR calc non Af Amer: 86 mL/min/{1.73_m2} (ref >59)
Glucose: 96 mg/dL (ref 65–99)
Potassium: 3.8 mmol/L (ref 3.5–5.2)
Sodium: 141 mmol/L (ref 134–144)

## 2020-07-03 NOTE — Telephone Encounter (Signed)
° °  [facility] Pre-operative Risk Assessment    HEARTCARE STAFF: - Please ensure there is not already an duplicate clearance open for this procedure. - Under Visit Info/Reason for Call, type in Other and utilize the format Clearance MM/DD/YY or Clearance TBD. Do not use dashes or single digits. - If request is for dental extraction, please clarify the # of teeth to be extracted.  Request for surgical clearance:  1. What type of surgery is being performed? Removal of all remaining teeth  2. When is this surgery scheduled? TOMORROW, 07/04/2020   3. What type of clearance is required (medical clearance vs. Pharmacy clearance to hold med vs. Both)? Medical  4. Are there any medications that need to be held prior to surgery and how long? Not indicated (although the patient is on ASA)  5. Practice name and name of physician performing surgery? Dr. Raynelle Dick office    6. What is the office phone number? [phone]   7.   What is the office fax number? [phone]  8.   Anesthesia type (None, local, MAC, general)? Twilight/Conscious sedation

## 2020-07-03 NOTE — Telephone Encounter (Addendum)
   Primary Cardiologist: [name], MD  Chart reviewed as part of pre-operative protocol coverage.   I just saw the patient earlier this week.  I called and spoke with him by phone today.  His diuretic was changed the other day.  He notes improved swelling.  His breathing is stable.  He denies orthopnea.    His potassium was low earlier this week (K+ 2.9).  This was replaced.  Repeat BMET is pending today.  PLAN:  As long as his K+ is normal, he will be able to undergo dental extraction tomorrow at acceptable risk. Richardson Dopp, PA-C 07/03/2020, 9:39 AM   Addendum: Lab Results  Component Value Date   K 3.8 07/03/2020    Pt may proceed with dental extraction at acceptable risk. I have faxed my OV from earlier this week to the oral surgeon's office. Richardson Dopp, PA-C    07/03/2020 1:19 PM

## 2020-07-07 ENCOUNTER — Other Ambulatory Visit: Payer: Self-pay

## 2020-07-07 ENCOUNTER — Other Ambulatory Visit: Payer: Medicare Other

## 2020-07-07 ENCOUNTER — Telehealth: Payer: Self-pay

## 2020-07-07 ENCOUNTER — Encounter (HOSPITAL_COMMUNITY)
Admission: RE | Admit: 2020-07-07 | Discharge: 2020-07-07 | Disposition: A | Discharge status: Home / Self Care | Payer: Medicare Other | Source: Ambulatory Visit | Attending: Internal Medicine | Admitting: Internal Medicine

## 2020-07-07 VITALS — BP 108/64 | HR 68 | Temp 97.3°F | Resp 20 | Wt 185.0 lb

## 2020-07-07 DIAGNOSIS — N471 Phimosis: Secondary | ICD-10-CM

## 2020-07-07 DIAGNOSIS — Z954 Presence of other heart-valve replacement: Secondary | ICD-10-CM

## 2020-07-07 DIAGNOSIS — I10 Essential (primary) hypertension: Secondary | ICD-10-CM

## 2020-07-07 DIAGNOSIS — B955 Unspecified streptococcus as the cause of diseases classified elsewhere: Secondary | ICD-10-CM

## 2020-07-07 DIAGNOSIS — D649 Anemia, unspecified: Secondary | ICD-10-CM

## 2020-07-07 DIAGNOSIS — I25709 Atherosclerosis of coronary artery bypass graft(s), unspecified, with unspecified angina pectoris: Secondary | ICD-10-CM

## 2020-07-07 DIAGNOSIS — I330 Acute and subacute infective endocarditis: Secondary | ICD-10-CM | POA: Diagnosis not present

## 2020-07-07 DIAGNOSIS — R600 Localized edema: Secondary | ICD-10-CM

## 2020-07-07 DIAGNOSIS — I358 Other nonrheumatic aortic valve disorders: Secondary | ICD-10-CM

## 2020-07-07 DIAGNOSIS — I5032 Chronic diastolic (congestive) heart failure: Secondary | ICD-10-CM

## 2020-07-07 LAB — CBC
HCT: 33.9 % — ABNORMAL LOW (ref 39.0–52.0)
Hemoglobin: 10.6 g/dL — ABNORMAL LOW (ref 13.0–17.0)
MCH: 28.2 pg (ref 26.0–34.0)
MCHC: 31.3 g/dL (ref 30.0–36.0)
MCV: 90.2 fL (ref 80.0–100.0)
Platelets: 273 10*3/uL (ref 150–400)
RBC: 3.76 MIL/uL — ABNORMAL LOW (ref 4.22–5.81)
RDW: 16 % — ABNORMAL HIGH (ref 11.5–15.5)
WBC: 7.9 10*3/uL (ref 4.0–10.5)
nRBC: 0 % (ref 0.0–0.2)

## 2020-07-07 LAB — COMPREHENSIVE METABOLIC PANEL
ALT: 15 U/L (ref 0–44)
AST: 19 U/L (ref 15–41)
Albumin: 3.4 g/dL — ABNORMAL LOW (ref 3.5–5.0)
Alkaline Phosphatase: 69 U/L (ref 38–126)
Anion gap: 11 (ref 5–15)
BUN: 19 mg/dL (ref 8–23)
CO2: 25 mmol/L (ref 22–32)
Calcium: 9.2 mg/dL (ref 8.9–10.3)
Chloride: 102 mmol/L (ref 98–111)
Creatinine, Ser: 1.07 mg/dL (ref 0.61–1.24)
GFR, Estimated: >60 mL/min (ref >60)
Glucose, Bld: 154 mg/dL — ABNORMAL HIGH (ref 70–99)
Potassium: 4.6 mmol/L (ref 3.5–5.1)
Sodium: 138 mmol/L (ref 135–145)
Total Bilirubin: 1 mg/dL (ref 0.3–1.2)
Total Protein: 6.1 g/dL — ABNORMAL LOW (ref 6.5–8.1)

## 2020-07-07 MED ORDER — DEXTROSE 5 % IV SOLN
1000.0000 mg | Freq: Once | INTRAVENOUS | Status: AC
  Administered 2020-07-07: 1000 mg via INTRAVENOUS
  Filled 2020-07-07: qty 50

## 2020-07-07 NOTE — Telephone Encounter (Signed)
Our lab called to report that the pt came in earlier today for BMET and CBC but the pt had a CMET and CBC this morning at the hospital for Dr. Michel Bickers... our labs were canceled to avoid the pt from being billed twice and will forward this note to [employer] PA so he can view the labs in [place].

## 2020-07-08 NOTE — Telephone Encounter (Signed)
Reviewed labs Recent Labs    07/07/20 1122  K 4.6  CREATININE 1.07  HGB 10.6*    Please call pt. K+, Creatinine normal.  Hgb stable.  PLAN:  Continue current medications. Richardson Dopp, PA-C    07/08/2020 8:21 AM

## 2020-07-09 ENCOUNTER — Other Ambulatory Visit: Payer: Self-pay | Admitting: *Deleted

## 2020-07-09 NOTE — Patient Outreach (Signed)
[facility]) Care Management  07/09/2020  Karriem Muench. March 22, 1946 [id number]  Telephone outreach #2 Unsuccessful, left message and requested a return call.  Pt has received [facility] materials including his [facility] Calendar to record his wts and glucose readings.  Reviewed HF Action Plan  Normal Glucose readings  Eye exam  Foot exam  Vaccinations:   Kayleen Memos C. [name], MSN, [facility] Gerontological Nurse Practitioner [facility] Care Management [phone]

## 2020-07-10 NOTE — Telephone Encounter (Signed)
I called and left patient a detailed message with lab results. Ok per patient DPR to leave a detailed message. Advised patient to call [phone] with any questions.

## 2020-07-14 ENCOUNTER — Other Ambulatory Visit: Payer: Self-pay | Admitting: *Deleted

## 2020-07-14 ENCOUNTER — Ambulatory Visit (INDEPENDENT_AMBULATORY_CARE_PROVIDER_SITE_OTHER): Payer: Self-pay | Admitting: Thoracic Surgery (Cardiothoracic Vascular Surgery)

## 2020-07-14 ENCOUNTER — Encounter: Payer: Self-pay | Admitting: Thoracic Surgery (Cardiothoracic Vascular Surgery)

## 2020-07-14 ENCOUNTER — Other Ambulatory Visit: Payer: Self-pay

## 2020-07-14 VITALS — BP 113/73 | HR 88 | Temp 97.7°F | Resp 20 | Ht 67.0 in | Wt 185.0 lb

## 2020-07-14 DIAGNOSIS — Z954 Presence of other heart-valve replacement: Secondary | ICD-10-CM

## 2020-07-14 NOTE — Telephone Encounter (Signed)
Patient is returning call to discuss lab results. 

## 2020-07-14 NOTE — Patient Instructions (Addendum)
Continue all previous medications without any changes at this time  Please call to reschedule your appointment for dental extraction ASAP  You are encouraged to enroll and participate in the outpatient cardiac rehab program beginning as soon as practical.  Check your blood pressure on a regular basis and keep a log for your records.  Discussed with your cardiologist and/or primary care physician whether or not your blood pressure medications should be adjusted.  You may return to driving an automobile as long as you are no longer requiring oral narcotic pain relievers during the daytime.  It would be wise to start driving only short distances during the daylight and gradually increase from there as you feel comfortable.

## 2020-07-14 NOTE — Progress Notes (Signed)
Wow! He has done so well, thanks to you, Cub.

## 2020-07-14 NOTE — Progress Notes (Signed)
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  The patient's postoperative convalescence was remarkably uncomplicated.  His old Port-A-Cath was removed and a PICC line placed for the patient to undergo a 6-week course of intravenous Rocephin, which he completed June 26, 2020.  Following that he was given a single dose of IV dalbavancin in an effort to cover him for pending dental extraction.  Since his last office visit he has been seen twice at [facility] where his medications for hypertension and diastolic heart failure have been adjusted.  Most recently Lasix was switched to torsemide.  He returns to our office today for routine follow-up and reports that he is feeling well.  His lower extremity edema has essentially completely resolved.  Blood pressure has been under fairly good control.  He no longer has any pain or soreness in his chest.  Unfortunately, for unclear reasons his dental extraction was canceled and has not yet been rescheduled.     Current Outpatient Medications  Medication Sig Dispense Refill  . abiraterone acetate (ZYTIGA) 250 MG tablet TAKE 4 TABLETS (1,000 MG TOTAL) BY MOUTH DAILY. TAKE ON AN EMPTY STOMACH 1 HOUR BEFORE  OR 2 HOURS AFTER A MEAL 120 tablet 0  . aspirin EC 81 MG tablet Take 81 mg by mouth daily. Swallow whole.    Marland Kitchen atorvastatin (LIPITOR) 40 MG tablet TAKE 1 TABLET EVERY DAY 90 tablet 2  . cholecalciferol (VITAMIN D3) 25 MCG (1000 UT) tablet Take 1,000 Units by mouth daily.    Marland Kitchen doxazosin (CARDURA) 8 MG tablet Take 1 tablet (8 mg total) by mouth at bedtime.    . ferrous sulfate 325 (65 FE) MG EC tablet Take 1 tablet (325 mg total) by mouth 2 (two) times daily before a meal. 60 tablet 2  . glipiZIDE (GLUCOTROL XL) 10 MG 24 hr tablet TAKE 1 TABLET TWICE DAILY 180 tablet 0  . labetalol (NORMODYNE) 200 MG tablet Take 3 tablets by mouth in the morning and 2 tablets by mouth in the evening    . levothyroxine (SYNTHROID) 150 MCG tablet TAKE 1 TABLET EVERY DAY 90 tablet 0  . metFORMIN (GLUCOPHAGE) 1000 MG tablet TAKE 1 TABLET TWICE DAILY WITH MEALS 180 tablet 1  . omeprazole (PRILOSEC) 40 MG capsule TAKE 1 CAPSULE BY MOUTH EVERY DAY 90 capsule 1  . potassium chloride SA (KLOR-CON) 20 MEQ tablet Take 2 tablets (40 mEq total) by mouth 2 (two) times daily. 360 tablet 3  . predniSONE (DELTASONE) 5 MG tablet TAKE 1 TABLET (5 MG TOTAL) BY MOUTH DAILY WITH BREAKFAST. 90 tablet 3  . torsemide (DEMADEX) 20 MG tablet Take 1 tablet (20 mg total) by mouth 2 (two) times daily. 180 tablet 3   No current facility-administered medications for this visit.  Physical Exam:   BP 113/73 (BP Location: Right Arm, Patient Position: Sitting, Cuff Size: Normal)   Pulse 88   Temp 97.7 F (36.5 C) (Skin)   Resp 20   Ht 5\' 7"  (1.702 m)   Wt 185 lb (83.9 kg)   SpO2 99% Comment: RA  BMI 28.98 kg/m   General:  Well-appearing  Chest:   Clear to auscultation  CV:   Regular rate and rhythm without murmur  Incisions:  Healing nicely, sternum stable  Abdomen:  Soft nontender  Extremities:  Warm and well-perfused with trace lower extremity edema  Diagnostic Tests:  ECHOCARDIOGRAM REPORT       Patient Name:  Matthew Serrano. Date of Exam: 06/24/2020  Medical Rec #: [id number]     Height:    67.0 in  Accession #:  [id number]     Weight:    215.0 lb  Date of Birth: December 02, 1945     BSA:     2.085 m  Patient Age:  78 years      BP:      154/96 mmHg  Patient Gender: M         HR:      74 bpm.  Exam Location: [place]   Procedure: 2D Echo, Cardiac Doppler and Color Doppler   Indications:  I35.9 Aortic valve disorder    History:    Patient has no prior history of Echocardiogram  examinations and         Patient has prior history of Echocardiogram examinations,  most         recent 05/13/2020. Nonobstructive CAD; Risk  Factors:Hypertension         and Diabetes. Aortic root replacement with repair of aorta  to         pulmonary artery fistula for Streptococcus anginosis  bacterial         endocarditis complicated by aortic root abscess, aortic to         pulmonary artery fistula, and severe aortic insufficiency  on         May 15, 2020.         Aortic Valve: valve is present in the aortic position.  Procedure         Date: 05/13/2020.    Sonographer:  Marygrace Drought RCS  Referring Phys: [place]    1. Left ventricular ejection fraction, by estimation, is 55 to 60%. The  left ventricle has normal function. The left ventricle has no regional  wall motion abnormalities. There is mild concentric left ventricular  hypertrophy. Left ventricular diastolic  parameters are consistent with Grade II diastolic dysfunction  (pseudonormalization). Elevated left ventricular end-diastolic pressure.  2. Right ventricular systolic function is normal. The right ventricular  size is normal. There is normal pulmonary artery systolic pressure.  3. Left atrial size was mildly dilated.  4. The mitral valve is normal in structure.  Trivial mitral valve  regurgitation. No evidence of mitral stenosis.  5. Repaired aorta/aortic valve without stenosis or regurgitation. The  aortic valve has been repaired/replaced. Aortic valve regurgitation is not  visualized. No aortic stenosis is present. There is a valve present in the  aortic position. Procedure Date:  05/13/2020. Aortic valve area, by VTI measures 2.05 cm. Aortic valve mean  gradient measures 5.0 mmHg. Aortic valve Vmax measures 1.59 m/s.  6. The inferior vena cava is normal in size with greater than 50%  respiratory variability, suggesting right atrial pressure  of 3 mmHg.   FINDINGS  Left Ventricle: Left ventricular ejection fraction, by estimation, is 55  to 60%. The left ventricle has normal function. The left ventricle has no  regional wall motion abnormalities. The left ventricular internal cavity  size was normal in size. There is  mild concentric left ventricular hypertrophy. Left ventricular diastolic  parameters are consistent with Grade II diastolic dysfunction  (pseudonormalization). Elevated left ventricular end-diastolic pressure.   Right Ventricle: The right ventricular size is normal. No increase in  right ventricular wall thickness. Right ventricular systolic function is  normal. There is normal pulmonary artery systolic pressure. The tricuspid  regurgitant velocity is 2.65 m/s, and  with an assumed right atrial pressure of 3 mmHg, the estimated right  ventricular systolic pressure is 83.6 mmHg.   Left Atrium: Left atrial size was mildly dilated.   Right Atrium: Right atrial size was normal in size.   Pericardium: There is no evidence of pericardial effusion.   Mitral Valve: The mitral valve is normal in structure. Trivial mitral  valve regurgitation. No evidence of mitral valve stenosis.   Tricuspid Valve: The tricuspid valve is normal in structure. Tricuspid  valve regurgitation is mild . No evidence of tricuspid stenosis.    Aortic Valve: Repaired aorta/aortic valve without stenosis or  regurgitation. The aortic valve has been repaired/replaced. Aortic valve  regurgitation is not visualized. No aortic stenosis is present. Aortic  valve mean gradient measures 5.0 mmHg. Aortic  valve peak gradient measures 10.1 mmHg. Aortic valve area, by VTI measures  2.05 cm. There is a valve present in the aortic position. Procedure Date:  05/13/2020.   Pulmonic Valve: The pulmonic valve was normal in structure. Pulmonic valve  regurgitation is not visualized. No evidence of pulmonic stenosis.   Aorta: The aortic root is normal in size and structure.   Venous: The inferior vena cava is normal in size with greater than 50%  respiratory variability, suggesting right atrial pressure of 3 mmHg.   IAS/Shunts: No atrial level shunt detected by color flow Doppler.     LEFT VENTRICLE  PLAX 2D  LVIDd:     4.00 cm Diastology  LVIDs:     2.90 cm LV e' medial:  4.46 cm/s  LV PW:     1.10 cm LV E/e' medial: 18.3  LV IVS:    1.40 cm LV e' lateral:  14.90 cm/s  LVOT diam:   2.00 cm LV E/e' lateral: 5.5  LV SV:     58  LV SV Index:  28  LVOT Area:   3.14 cm     RIGHT VENTRICLE  RV Basal diam: 4.10 cm  RV S prime:   9.03 cm/s  TAPSE (M-mode): 1.3 cm  RVSP:      31.1 mmHg   LEFT ATRIUM       Index    RIGHT ATRIUM      Index  LA diam:    3.90 cm 1.87 cm/m RA Pressure: 3.00 mmHg  LA Vol (A2C):  71.8 ml 34.43 ml/m RA Area:   21.40 cm  LA Vol (A4C):  59.6 ml 28.58 ml/m RA Volume:  58.20 ml 27.91 ml/m  LA Biplane Vol: 67.2 ml 32.22 ml/m  AORTIC VALVE  AV Area (Vmax):  1.72 cm  AV Area (Vmean):  1.90 cm  AV Area (VTI):   2.05 cm  AV Vmax:      159.00 cm/s  AV Vmean:     107.000 cm/s  AV VTI:  0.282 m  AV Peak Grad:   10.1 mmHg  AV Mean Grad:   5.0 mmHg  LVOT Vmax:     86.80 cm/s  LVOT Vmean:    64.700  cm/s  LVOT VTI:     0.184 m  LVOT/AV VTI ratio: 0.65    AORTA  Ao Root diam: 3.90 cm  Ao Asc diam: 3.50 cm   MITRAL VALVE        TRICUSPID VALVE  MV Area (PHT):       TR Peak grad:  28.1 mmHg  MV Decel Time:       TR Vmax:    265.00 cm/s  MV E velocity: 81.80 cm/s Estimated RAP: 3.00 mmHg  MV A velocity: 68.10 cm/s RVSP:      31.1 mmHg  MV E/A ratio: 1.20               SHUNTS               Systemic VTI: 0.18 m               Systemic Diam: 2.00 cm   Skeet Latch MD  Electronically signed by Skeet Latch MD  Signature Date/Time: 06/24/2020/2:26:26 PM    Impression:  Patient is clinically doing quite well approximately 2 months status post urgent aortic root replacement using human allograft for treatment of 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   Blood pressure is under good control and chronic diastolic congestive heart failure with fluid overload has improved considerably.  Routine follow-up echocardiogram demonstrates normal left ventricular function with normal aortic valve function.  Unfortunately, dental extraction has again been postponed for unclear reasons.  Plan:  We have not recommended any change the patient's current medications.  We will reach out to Dr. Raynelle Dick office in an effort to make sure that the patient proceeds with dental extraction in the immediate future.  I have encouraged the patient to continue to increase his physical activity.  He may resume driving an automobile.  I have encouraged him to enroll and participate in the cardiac rehab program.  He will return to our office for follow-up in approximately 6 weeks.    Valentina Gu. Roxy Manns, MD 07/14/2020 3:15 PM

## 2020-07-15 NOTE — Telephone Encounter (Signed)
The patient has been notified of the result and verbalized understanding.  All questions (if any) were answered. Mady Haagensen, [facility] 07/15/2020 3:56 PM

## 2020-07-16 ENCOUNTER — Other Ambulatory Visit: Payer: Self-pay

## 2020-07-16 ENCOUNTER — Other Ambulatory Visit: Payer: Self-pay | Admitting: *Deleted

## 2020-07-16 NOTE — Patient Outreach (Signed)
[place] [employer]) Care Management  07/16/2020  [name]. May 15, 1946 [id number]  Telephone outreach successful.   Mr. Matthew Serrano is doing well. He has had his cardiac follow up. He has received all the materials NP has sent.  Encouraged [name] to complete his Advanced directives over the holiday season, make copies, give a copy to [facility] and to MD.  Encouraged to read All About Carbs and start incorporating principles into choosing food and his serving sizes.  Discussed wt goal of 175 current wt 183#.  Goals    . Monitor and Manage My Blood Sugar     Follow Up Date 08/06/20    - check blood sugar at prescribed times - check blood sugar before and after exercise - check blood sugar if I feel it is too high or too low - enter blood sugar readings and medication or insulin into daily log - take the blood sugar log to all doctor visits - take the blood sugar meter to all doctor visits    Why is this important?   Checking your blood sugar at home helps to keep it from getting very high or very low.  Writing the results in a diary or log helps the doctor know how to care for you.  Your blood sugar log should have the time, date and the results.  Also, write down the amount of insulin or other medicine that you take.  Other information, like what you ate, exercise done and how you were feeling, will also be helpful.     Notes: Glucose levels almost always <120 fasting. Has [facility] disease mgmt calendar to record gluocse levels and wts. Has new educational tool, All About Carbs to review and start using to improve his dietary intake and reduce carbs.    [name] Eye Exam     Follow Up Date 08/06/20  - keep appointment with eye doctor - schedule appointment with eye doctor    Why is this important?   Eye check-ups are important when you have diabetes.  Vision loss can be prevented.    Notes: Assess if has had his eye exam in last year.    . Record weight daily      Pt to weigh daily and record.    . Weight (lb) < 175 lb (79.4 kg)     Will learn carb counting to help lose wt and improve DM management.      We will talk again in 2 weeks.  [name]. [name], MSN, [facility] Gerontological Nurse Practitioner [facility] Care Management [phone]

## 2020-07-21 ENCOUNTER — Other Ambulatory Visit: Payer: Self-pay | Admitting: Oncology

## 2020-07-21 DIAGNOSIS — C61 Malignant neoplasm of prostate: Secondary | ICD-10-CM

## 2020-07-22 ENCOUNTER — Other Ambulatory Visit: Payer: Self-pay | Admitting: Oncology

## 2020-07-24 ENCOUNTER — Encounter: Payer: Self-pay | Admitting: Internal Medicine

## 2020-07-24 ENCOUNTER — Telehealth: Payer: Self-pay

## 2020-07-24 ENCOUNTER — Ambulatory Visit (INDEPENDENT_AMBULATORY_CARE_PROVIDER_SITE_OTHER): Payer: Medicare Other | Admitting: Internal Medicine

## 2020-07-24 ENCOUNTER — Other Ambulatory Visit: Payer: Self-pay

## 2020-07-24 DIAGNOSIS — I25709 Atherosclerosis of coronary artery bypass graft(s), unspecified, with unspecified angina pectoris: Secondary | ICD-10-CM

## 2020-07-24 DIAGNOSIS — Z954 Presence of other heart-valve replacement: Secondary | ICD-10-CM | POA: Diagnosis not present

## 2020-07-24 MED FILL — ABIRATERONE ACETATE 250 MG: 250 | 30 days supply | Qty: 120 | Fill #0

## 2020-07-24 NOTE — Telephone Encounter (Signed)
Patient scheduled to have dental surgery 08/22/20. Needs IV Abx per Dr. Zubair Lofton Salon before then. Orders faxed to [facility] Short Stay with receipt of successful fax transmission. Short stay appointment scheduled for 08/18/20 at 10 am. Patient made aware of appointment time and is familiar with location.   Beryle Flock, RN

## 2020-07-24 NOTE — Progress Notes (Signed)
[facility] for [facility]ious Disease  Patient Active Problem List   Diagnosis Date Noted  . 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    Priority: High  . Abscess of aortic valve 05/14/2020    Priority: High  . Streptococcal bacteremia 05/13/2020    Priority: High  . Aortic valve endocarditis 05/13/2020    Priority: High  . Severe aortic insufficiency 05/13/2020    Priority: High  . Aortic stenosis     Priority: High  . Poor dentition 06/26/2020  . Streptococcal endocarditis 05/21/2020  . 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 History of GI bleed 05/14/2020  . Normocytic anemia 05/14/2020  . Endocarditis 05/14/2020  . 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 Acute CHF (congestive heart failure) ([place]) 05/13/2020  . Respiratory distress 05/13/2020  . Acute blood loss anemia 05/02/2020  . Malignant neoplasm of prostate ([place]) 06/23/2018  . Goals of care, counseling/discussion 06/23/2018  . Left inguinal hernia 09/04/2015  . Alcoholism ([place]) 06/10/2014  . Anemia of chronic disease 05/23/2014  . Elevated PSA 01/20/2012  . HEMORRHAGE OF RECTUM AND ANUS 12/15/2008  . ERECTILE DYSFUNCTION, MILD 12/14/2007  . SEBORRHEIC KERATOSIS, INFLAMED 08/03/2007  . NEOPLASM, SKIN, UNCERTAIN BEHAVIOR 16/05/9603  . Hypothyroidism 07/06/2007  . Diabetes mellitus ([place]) 07/06/2007  . Hyperlipidemia 07/06/2007  . Essential hypertension 07/06/2007    Patient's Medications  New Prescriptions   No medications on file  Previous Medications   ABIRATERONE ACETATE (ZYTIGA) 250 MG TABLET    TAKE 4 TABLETS (1,000 MG TOTAL) BY MOUTH DAILY. TAKE ON AN EMPTY STOMACH 1 HOUR BEFORE OR 2 HOURS AFTER A MEAL   ASPIRIN EC 81 MG TABLET    Take 81 mg by mouth daily. Swallow whole.   ATORVASTATIN (LIPITOR) 40 MG TABLET    TAKE 1 TABLET EVERY DAY   CHOLECALCIFEROL (VITAMIN D3) 25 MCG (1000 UT) TABLET    Take 1,000 Units by mouth daily.   DOXAZOSIN  (CARDURA) 8 MG TABLET    Take 1 tablet (8 mg total) by mouth at bedtime.   FERROUS SULFATE 325 (65 FE) MG EC TABLET    Take 1 tablet (325 mg total) by mouth 2 (two) times daily before a meal.   GLIPIZIDE (GLUCOTROL XL) 10 MG 24 HR TABLET    TAKE 1 TABLET TWICE DAILY   LABETALOL (NORMODYNE) 200 MG TABLET    Take 3 tablets by mouth in the morning and 2 tablets by mouth in the evening   LEVOTHYROXINE (SYNTHROID) 150 MCG TABLET    TAKE 1 TABLET EVERY DAY   METFORMIN (GLUCOPHAGE) 1000 MG TABLET    TAKE 1 TABLET TWICE DAILY WITH MEALS   OMEPRAZOLE (PRILOSEC) 40 MG CAPSULE    TAKE 1 CAPSULE BY MOUTH EVERY DAY   POTASSIUM CHLORIDE SA (KLOR-CON) 20 MEQ TABLET    Take 2 tablets (40 mEq total) by mouth 2 (two) times daily.   PREDNISONE (DELTASONE) 5 MG TABLET    TAKE 1 TABLET (5 MG TOTAL) BY MOUTH DAILY WITH BREAKFAST.   TORSEMIDE (DEMADEX) 20 MG TABLET    Take 1 tablet (20 mg total) by mouth 2 (two) times daily.  Modified Medications   No medications on file  Discontinued Medications   No medications on file    Subjective: Mr. Woodford is in for his hospital follow-up visit.  He was admitted to the hospital about 5 weeks ago with subacute aortic valve endocarditis.  Blood cultures were positive for strep anginosis.  He had a large aortic valve abscess and required aortic root replacement by Dr. Lilly Cove.  He was discharged on IV ceftriaxone and completed 6 weeks of therapy on 06/26/2020.  He received IV dalbavancin on 06/27/2020 at 07/07/2020.  He is feeling much better.   He is still waiting to have oral surgery by Dr. Frederik Schmidt.  His understanding is that he will have his remaining upper teeth removed.  He says that he hopes to have it scheduled in 1 week Dr. Roxy Manns prefers that he remain on IV antibiotics until he is completed his dental work.   Review of Systems: Review of Systems  Constitutional: Negative for chills, diaphoresis and fever.  Respiratory: Negative for cough and shortness of breath.     Cardiovascular: Negative for chest pain.  Gastrointestinal: Negative for abdominal pain, diarrhea, nausea and vomiting.  Musculoskeletal: Negative for back pain and joint pain.    Past Medical History:  Diagnosis Date  . Aortic valve endocarditis 05/13/2020   STREPTOCOCCUS ANGINOSIS  . AVM (arteriovenous malformation) of colon   . Cancer [facility])    prostate cancer  . DIABETES MELLITUS, TYPE II 07/06/2007  . ED (erectile dysfunction)   . Elevated PSA 01/20/2012  . 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 History of nuclear stress test    Myoview 8/18: EF 64, inferobasal thinning, no ischemia, low risk  . HYPERLIPIDEMIA 07/06/2007  . HYPERTENSION 07/06/2007  . HYPOTHYROIDISM 07/06/2007  . Leg pain    ABIs 8/18:  Normal   . Obesity   . 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 Severe aortic insufficiency 05/13/2020   acute onset in setting of bacterial endocarditis  . Severe aortic stenosis     Social History   Tobacco Use  . Smoking status: Former Research scientist (life sciences)  . Smokeless tobacco: Never Used  . Tobacco comment: quit 2005  Vaping Use  . Vaping Use: Never used  Substance Use Topics  . Alcohol use: Not Currently    Alcohol/week: 21.0 standard drinks    Types: 21 Cans of beer per week  . Drug use: No    Family History  Problem Relation Age of Onset  . Hypertension Other   . Breast cancer Sister   . Breast cancer Other        family history  . Breast cancer Brother        prostate ca  . Aneurysm Mother        Brain aneurysm   . Prostate cancer Father   . Colon cancer Neg Hx   . Esophageal cancer Neg Hx   . Stomach cancer Neg Hx   . Rectal cancer Neg Hx     No Known Allergies  Objective: Vitals:   07/24/20 0935  BP: 120/80  Pulse: 69  Temp: 98.3 F (36.8 C)  TempSrc: Oral  SpO2:  100%  Weight: 190 lb (86.2 kg)  Height: 5\' 6"  (1.676 m)   Body mass index is 30.67 kg/m.  Physical Exam Constitutional:      Comments: He is in good spirits.  HENT:     Mouth/Throat:     Comments: He only has a few remaining teeth. Cardiovascular:     Rate and Rhythm: Normal rate.     Heart sounds: No  murmur heard.   Pulmonary:     Effort: Pulmonary effort is normal.     Breath sounds: Normal breath sounds.  Chest:    Abdominal:     Palpations: Abdomen is soft.     Tenderness: There is no abdominal tenderness.  Psychiatric:        Mood and Affect: Mood normal.       Problem List Items Addressed This Visit      High   S/P aortic root replacement with human allograft    I will order another dose of IV dalbavancin and see him back in 2 weeks.          Michel Bickers, MD [facility] [phone] pager   [phone] cell 07/24/2020, 9:54 AM

## 2020-07-24 NOTE — Assessment & Plan Note (Signed)
I will order another dose of IV dalbavancin and see him back in 2 weeks.

## 2020-07-25 ENCOUNTER — Ambulatory Visit (INDEPENDENT_AMBULATORY_CARE_PROVIDER_SITE_OTHER): Payer: Medicare Other

## 2020-07-25 DIAGNOSIS — Z23 Encounter for immunization: Secondary | ICD-10-CM

## 2020-07-25 NOTE — Progress Notes (Signed)
   [id number]  Name:  Matthew Serrano.    MRN: [id number] DOB: 11-17-1945  07/25/2020  Mr. Matthew Serrano was observed post Covid-19 immunization for 15 minutes without incident. He was provided with Vaccine Information Sheet and instruction to access the V-Safe system.   Mr. Matthew Serrano was instructed to call 911 with any severe reactions post vaccine: Marland Kitchen Difficulty breathing  . Swelling of face and throat  . A fast heartbeat  . A bad rash all over body  . Dizziness and weakness   Immunizations Administered    Name Date Dose VIS Date Route   Pfizer COVID-19 Vaccine 07/25/2020  9:10 AM 0.3 mL 06/11/2020 Intramuscular   Manufacturer: [place]   Lot: [id number]   NDC: Jolly Brooks Sailors

## 2020-07-27 NOTE — Progress Notes (Signed)
Cardiology Office Note:    Date:  07/28/2020   ID:  Beecher Mcardle., DOB 1946-08-14, MRN [id number]  PCP:  [name], NP  [facility] Cardiologist:  [name], MD   [place] Electrophysiologist:  None   Referring MD: [name], NP   Chief Complaint:  Follow-up (CHF)    Patient Profile:    Matthew Serrano. is a 74 y.o. male with:   Aortic stenosis ? 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 Cath 6/21: mod non-obs CAD   Diabetes mellitus   Hypertension   Hyperlipidemia  Hypothyroidism   Erectile dysfunction  Metastatic prostate CA on suppression chemotherapy   Hx of LGI bleed 01/2020 ? Colo w cecal telangiectasias >> treated w APC ? S/p PRBC transfusions    Prior CV studies:  Echocardiogram 06/24/2020 EF 55-60, mild conc LVH, Gr 2 DD, normal RVSF, mild LAE, trivial MR, AVR functioning normally (mean gradient 5 mmHg)  Cardiac catheterization 02/13/20 LAD mid 50 LCx prox 50 RCA mid 45  Carotid US 02/20/20 Bilat ICA 1-39  ABIs 8/18:  Normal  Nuclear stress test 03/25/17 Probably normal, low risk stress nuclear study with inferobasal thinning but no significant ischemia; EF 64 with normal wall motion.  Abdominal MRA 8/13 1. Negative for renal artery stenosis or other lesion to suggest renovascular component of hypertension.  Renal Art Duplex 6/13 No RAS; normal caliber abd aorta   History of Present Illness:    Matthew Serrano was last seen in 06/2020.  He was still s/w volume overloaded. I changed his furosemide to torsemide.  He is here alone.  Since last seen, he has done well.  His leg edema has resolved.  He has not had orthopnea, chest discomfort, syncope.      Past Medical History:   Diagnosis Date  . Aortic valve endocarditis 05/13/2020   STREPTOCOCCUS ANGINOSIS  . AVM (arteriovenous malformation) of colon   . Cancer [facility])    prostate cancer  . DIABETES MELLITUS, TYPE II 07/06/2007  . ED (erectile dysfunction)   . Elevated PSA 01/20/2012  . 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 History of nuclear stress test    Myoview 8/18: EF 64, inferobasal thinning, no ischemia, low risk  . HYPERLIPIDEMIA 07/06/2007  . HYPERTENSION 07/06/2007  . HYPOTHYROIDISM 07/06/2007  . Leg pain    ABIs 8/18:  Normal   . Obesity   . 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 Severe aortic insufficiency 05/13/2020   acute onset in setting of bacterial endocarditis  . Severe aortic stenosis     Current Medications: Current Meds  Medication Sig  . abiraterone acetate (ZYTIGA) 250 MG tablet TAKE 4 TABLETS (1,000 MG TOTAL) BY MOUTH DAILY. TAKE ON AN EMPTY STOMACH 1 HOUR BEFORE OR 2 HOURS AFTER A MEAL  . aspirin EC 81 MG tablet Take 81 mg by mouth daily. Swallow whole.  Marland Kitchen atorvastatin (LIPITOR) 40 MG tablet TAKE 1 TABLET EVERY DAY  . cholecalciferol (VITAMIN D3) 25 MCG (1000 UT) tablet Take 1,000 Units by mouth daily.  Marland Kitchen doxazosin (  CARDURA) 8 MG tablet Take 1 tablet (8 mg total) by mouth at bedtime.  . ferrous sulfate 325 (65 FE) MG EC tablet Take 1 tablet (325 mg total) by mouth 2 (two) times daily before a meal.  . glipiZIDE (GLUCOTROL XL) 10 MG 24 hr tablet TAKE 1 TABLET TWICE DAILY  . labetalol (NORMODYNE) 200 MG tablet Take 3 tablets by mouth in the morning and 2 tablets by mouth in the evening  . levothyroxine (SYNTHROID) 150 MCG tablet TAKE 1 TABLET EVERY DAY  . metFORMIN (GLUCOPHAGE) 1000 MG tablet TAKE 1 TABLET TWICE DAILY WITH MEALS  . omeprazole (PRILOSEC) 40 MG capsule TAKE 1 CAPSULE BY MOUTH  EVERY DAY  . potassium chloride SA (KLOR-CON) 20 MEQ tablet Take 2 tablets (40 mEq total) by mouth 2 (two) times daily.  . predniSONE (DELTASONE) 5 MG tablet TAKE 1 TABLET (5 MG TOTAL) BY MOUTH DAILY WITH BREAKFAST.  Marland Kitchen torsemide (DEMADEX) 20 MG tablet Take 1 tablet (20 mg total) by mouth 2 (two) times daily.     Allergies:   Patient has no known allergies.   Social History   Tobacco Use  . Smoking status: Former Research scientist (life sciences)  . Smokeless tobacco: Never Used  . Tobacco comment: quit 2005  Vaping Use  . Vaping Use: Never used  Substance Use Topics  . Alcohol use: Not Currently    Alcohol/week: 21.0 standard drinks    Types: 21 Cans of beer per week  . Drug use: No     Family Hx: The patient's family history includes Aneurysm in his mother; Breast cancer in his brother, sister, and another family member; Hypertension in an other family member; Prostate cancer in his father. There is no history of Colon cancer, Esophageal cancer, Stomach cancer, or Rectal cancer.  ROS   EKGs/Labs/Other Test Reviewed:    EKG:  EKG is not ordered today.  The ekg ordered today demonstrates n/a  Recent Labs: 05/13/2020: B Natriuretic Peptide 484.0 05/16/2020: Magnesium 2.1 07/07/2020: ALT 15; BUN 19; Creatinine, Ser 1.07; Hemoglobin 10.6; Platelets 273; Potassium 4.6; Sodium 138   Recent Lipid Panel Lab Results  Component Value Date/Time   CHOL 154 07/26/2019 09:21 AM   TRIG 113.0 07/26/2019 09:21 AM   HDL 48.20 07/26/2019 09:21 AM   CHOLHDL 3 07/26/2019 09:21 AM   LDLCALC 83 07/26/2019 09:21 AM   LDLDIRECT 105.0 04/28/2016 07:56 AM      Risk Assessment/Calculations:      Physical Exam:    VS:  BP 110/64   Pulse 78   Ht 5\' 6"  (1.676 m)   Wt 187 lb 9.6 oz (85.1 kg)   SpO2 98%   BMI 30.28 kg/m     Wt Readings from Last 3 Encounters:  07/28/20 187 lb 9.6 oz (85.1 kg)  07/24/20 190 lb (86.2 kg)  07/14/20 185 lb (83.9 kg)     Constitutional:      Appearance: Healthy appearance. Not in  distress.  Neck:     Vascular: JVD normal.  Pulmonary:     Effort: Pulmonary effort is normal.     Breath sounds: No wheezing. No rales.  Cardiovascular:     Normal rate. Regular rhythm. Normal S1. Normal S2.     Murmurs: There is a grade 2/6 crescendo-decrescendo systolic murmur at the URSB.  Edema:    Peripheral edema absent.  Abdominal:     Palpations: Abdomen is soft. There is no hepatomegaly.  Skin:    General: Skin is warm and dry.  Neurological:     General: No focal deficit present.     Mental Status: Alert and oriented to person, place and time.     Cranial Nerves: Cranial nerves are intact.       ASSESSMENT & PLAN:    1. Chronic heart failure with preserved ejection fraction [facility]) Echocardiogram November 2021 with normal LV function and moderate diastolic dysfunction.  His weight has decreased 8 pounds since his last visit and his leg edema has resolved on torsemide.  Most recent creatinine and potassium normal.  Continue current management.  Follow-up in 4 months.  2. Aortic valve endocarditis 3. S/P aortic root replacement with human allograft Recent echocardiogram with normally functioning AVR.  Continue SBE prophylaxis.  4. Essential hypertension The patient's blood pressure is controlled on his current regimen.       Dispo:  Return in about 4 months (around 11/26/2020) for Routine Follow Up, w/ Dr. Burt Knack, in person.   Medication Adjustments/Labs and Tests Ordered: Current medicines are reviewed at length with the patient today.  Concerns regarding medicines are outlined above.  Tests Ordered: No orders of the defined types were placed in this encounter.  Medication Changes: No orders of the defined types were placed in this encounter.   Signed, Richardson Dopp, PA-C  07/28/2020 12:26 PM    [facility] [address], [place], [place]  [address] Phone: [phone]; Fax: [phone]

## 2020-07-28 ENCOUNTER — Other Ambulatory Visit: Payer: Self-pay | Admitting: Adult Health

## 2020-07-28 ENCOUNTER — Ambulatory Visit (INDEPENDENT_AMBULATORY_CARE_PROVIDER_SITE_OTHER): Payer: Medicare Other | Admitting: Physician Assistant

## 2020-07-28 ENCOUNTER — Encounter: Payer: Self-pay | Admitting: Physician Assistant

## 2020-07-28 ENCOUNTER — Other Ambulatory Visit: Payer: Self-pay

## 2020-07-28 VITALS — BP 110/64 | HR 78 | Ht 66.0 in | Wt 187.6 lb

## 2020-07-28 DIAGNOSIS — I25709 Atherosclerosis of coronary artery bypass graft(s), unspecified, with unspecified angina pectoris: Secondary | ICD-10-CM

## 2020-07-28 DIAGNOSIS — I358 Other nonrheumatic aortic valve disorders: Secondary | ICD-10-CM

## 2020-07-28 DIAGNOSIS — I10 Essential (primary) hypertension: Secondary | ICD-10-CM

## 2020-07-28 DIAGNOSIS — I5032 Chronic diastolic (congestive) heart failure: Secondary | ICD-10-CM

## 2020-07-28 DIAGNOSIS — Z954 Presence of other heart-valve replacement: Secondary | ICD-10-CM | POA: Diagnosis not present

## 2020-07-28 NOTE — Patient Instructions (Addendum)
Medication Instructions:  Your physician recommends that you continue on your current medications as directed. Please refer to the Current Medication list given to you today.  *If you need a refill on your cardiac medications before your next appointment, please call your pharmacy*  Lab Work: None ordered today  If you have labs (blood work) drawn today and your tests are completely normal, you will receive your results only by: Marland Kitchen MyChart Message (if you have MyChart) OR . A paper copy in the mail If you have any lab test that is abnormal or we need to change your treatment, we will call you to review the results.  Testing/Procedures: None ordered today  Follow-Up: On 12/22/2020 at 8:20AM with [name], MD

## 2020-07-29 ENCOUNTER — Encounter: Payer: Self-pay | Admitting: Adult Health

## 2020-07-29 ENCOUNTER — Ambulatory Visit (INDEPENDENT_AMBULATORY_CARE_PROVIDER_SITE_OTHER): Payer: Medicare Other | Admitting: Adult Health

## 2020-07-29 VITALS — BP 90/58 | HR 66 | Temp 98.4°F | Ht 67.0 in | Wt 185.2 lb

## 2020-07-29 DIAGNOSIS — I10 Essential (primary) hypertension: Secondary | ICD-10-CM

## 2020-07-29 DIAGNOSIS — E119 Type 2 diabetes mellitus without complications: Secondary | ICD-10-CM | POA: Diagnosis not present

## 2020-07-29 DIAGNOSIS — E039 Hypothyroidism, unspecified: Secondary | ICD-10-CM | POA: Diagnosis not present

## 2020-07-29 DIAGNOSIS — I351 Nonrheumatic aortic (valve) insufficiency: Secondary | ICD-10-CM

## 2020-07-29 DIAGNOSIS — Z954 Presence of other heart-valve replacement: Secondary | ICD-10-CM | POA: Diagnosis not present

## 2020-07-29 DIAGNOSIS — Z23 Encounter for immunization: Secondary | ICD-10-CM | POA: Diagnosis not present

## 2020-07-29 DIAGNOSIS — I358 Other nonrheumatic aortic valve disorders: Secondary | ICD-10-CM | POA: Diagnosis not present

## 2020-07-29 DIAGNOSIS — E782 Mixed hyperlipidemia: Secondary | ICD-10-CM | POA: Diagnosis not present

## 2020-07-29 DIAGNOSIS — C61 Malignant neoplasm of prostate: Secondary | ICD-10-CM | POA: Diagnosis not present

## 2020-07-29 NOTE — Progress Notes (Unsigned)
Subjective:    Patient ID: Matthew Serrano., male    DOB: [birth year], 74 y.o.   MRN: [id number]  HPI Patient presents for yearly preventative medicine examination. 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   CHF with preserved ejection fraction -is monitored by cardiology.  Echocardiogram November 2021 showed normal LV function with moderate diastolic dysfunction.  Currently prescribed torsemide which has resolved his leg edema.  Arctic valve endocarditis-was admitted to the hospital on 05/13/2020 with subcu aortic valve endocarditis.  His blood cultures were positive for strep anginosis.  He had a large aortic valve abscess and required aortic root replacement.  He was discharged on IV ceftriaxone and completed 6 weeks of therapy on 06/26/2020.  He received IV dalbavancin on 06/27/2020 and 07/07/2020.  Metastatic prostate cancer-on suppression chemotherapy  Hyperlipidemia -currently prescribed Lipitor 40 mg daily.  Denies myalgia or fatigue Lab Results  Component Value Date   CHOL 153 07/29/2020   HDL 48 07/29/2020   LDLCALC 82 07/29/2020   LDLDIRECT 105.0 04/28/2016   TRIG 129 07/29/2020   CHOLHDL 3.2 07/29/2020   DM -currently prescribed glipizide 10 mg extended release twice daily and Metformin 1000 mg twice daily.  He has been monitoring his blood sugars at home and reports readings consistently in the 110s to 120s.  He has had a few lows recently  in the 42s to 48s.  He has been symptomatic at this point.  Hypothyroidism-currently well controlled on Synthroid 150 mg daily  GERD-takes Prilosec 40 mg daily  Hypertension -prescribed doxazosin 8 mg, clobetasol 600 mg in the morning and 400 mg in the evening, and torsemide 20 mg twice daily.  He denies dizziness, lightheadedness, chest pain, shortness of breath BP Readings from Last 3 Encounters:  07/29/20 (!) 90/58  07/28/20 110/64  07/24/20 120/80    All immunizations and health maintenance protocols were reviewed with the patient and needed orders were placed.  Appropriate screening laboratory values were ordered for the patient including screening of hyperlipidemia, renal function and hepatic function.  Medication reconciliation,  past medical history, social history, problem list and allergies were reviewed in detail with the patient  Goals were established with regard to weight loss, exercise, and  diet in compliance with medications.  He does report that his appetite is good and his weight is stabilizing.  He did has lost a lot of weight with all of his newly diagnosed chronic issues going on.  Wt Readings from Last 10 Encounters:  07/29/20 185 lb 3.2 oz (84 kg)  07/28/20 187 lb 9.6 oz (85.1 kg)  07/24/20 190 lb (86.2 kg)  07/14/20 185 lb (83.9 kg)  07/07/20 185 lb (83.9 kg)  06/30/20 199 lb 3.2 oz (90.4 kg)  06/27/20 210 lb (95.3 kg)  06/26/20 210 lb 3.2 oz (95.3 kg)  06/26/20 210 lb (95.3 kg)  06/19/20 215 lb (97.5 kg)    Review of Systems  Constitutional: Positive for fatigue.  HENT: Negative.   Eyes: Negative.   Respiratory: Negative.   Cardiovascular: Negative.   Gastrointestinal: Negative.   Endocrine: Negative.  Genitourinary: Negative.   Musculoskeletal: Negative.   Skin: Negative.   Allergic/Immunologic: Negative.   Neurological: Negative.   Hematological: Negative.   Psychiatric/Behavioral: Negative.   All other systems reviewed and are  negative.  Past Medical History:  Diagnosis Date  . Aortic valve endocarditis 05/13/2020   STREPTOCOCCUS ANGINOSIS  . AVM (arteriovenous malformation) of colon   . Cancer [facility])    prostate cancer  . DIABETES MELLITUS, TYPE II 07/06/2007  . ED (erectile dysfunction)   . Elevated PSA 01/20/2012  . 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 History of nuclear stress test    Myoview 8/18: EF 64, inferobasal thinning, no ischemia, low risk  . HYPERLIPIDEMIA 07/06/2007  . HYPERTENSION 07/06/2007  . HYPOTHYROIDISM 07/06/2007  . Leg pain    ABIs 8/18:  Normal   . Obesity   . 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 Severe aortic insufficiency 05/13/2020   acute onset in setting of bacterial endocarditis  . Severe aortic stenosis     Social History   Socioeconomic History  . Marital status: Widowed    Spouse name: Not on file  . Number of children: Not on file  . Years of education: Not on file  . Highest education level: Not on file  Occupational History  . Not on file  Tobacco Use  . Smoking status: Former Research scientist (life sciences)  . Smokeless tobacco: Never Used  . Tobacco comment: quit 2005  Vaping Use  . Vaping Use: Never used  Substance and Sexual Activity  . Alcohol use: Not Currently    Alcohol/week: 21.0 standard drinks    Types: 21 Cans of beer per week  . Drug use: No  . Sexual activity: Not on file  Other Topics Concern  . Not on file  Social History Narrative   He works as a Freight forwarder    Not married    No kids          Social Determinants of Radio broadcast assistant Strain:   . Difficulty of Paying Living Expenses: Not on file  Food Insecurity:   . Worried About Charity fundraiser in the Last Year: Not on file  . Ran Out of Food in the Last Year: Not on file  Transportation  Needs:   . Lack of Transportation (Medical): Not on file  . Lack of Transportation (Non-Medical): Not on file  Physical Activity:   . Days of Exercise per Week: Not on file  . Minutes of Exercise per Session: Not on file  Stress:   . Feeling of Stress : Not on file  Social Connections:   . Frequency of Communication with Friends and Family: Not on file  . Frequency of Social Gatherings with Friends and Family: Not on file  . Attends Religious Services: Not on file  . Active Member of Clubs or Organizations: Not on file  . Attends Archivist Meetings: Not on file  . Marital Status: Not on file  Intimate Partner Violence:   . Fear of Current or Ex-Partner: Not on file  . Emotionally Abused: Not on file  . Physically Abused: Not on file  . Sexually Abused: Not on file    Past Surgical History:  Procedure Laterality Date  . AORTIC VALVE REPLACEMENT N/A 05/15/2020   Procedure: AORTIC VALVE  REPLACEMENT (AVR) SIZE 23 MM,  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;  Surgeon: Rexene Alberts, MD;  Location: [place];  Service: Open Heart Surgery;  Laterality: N/A;  . ASCENDING AORTIC ROOT REPLACEMENT N/A 05/15/2020   Procedure: HUMAN ALLOGRAFT AORTIC ROOT REPLACEMENT;  Surgeon: Rexene Alberts, MD;  Location: [place];  Service: Open Heart Surgery;  Laterality: N/A;  . COLONOSCOPY    . COLONOSCOPY N/A 05/03/2020   Procedure: COLONOSCOPY;  Surgeon: Juanita Craver, MD;  Location: [facility];  Service: Endoscopy;  Laterality: N/A;  . COLONOSCOPY W/ BIOPSIES AND POLYPECTOMY    . ENTEROSCOPY N/A 05/02/2020   Procedure: ENTEROSCOPY;  Surgeon: Rush Landmark Telford Nab., MD;  Location: [facility];  Service: Gastroenterology;  Laterality: N/A;  . HEMOSTASIS CLIP PLACEMENT  05/03/2020   Procedure: HEMOSTASIS CLIP PLACEMENT;  Surgeon: Juanita Craver, MD;  Location: [facility];  Service: Endoscopy;;  . HOT HEMOSTASIS N/A 05/03/2020   Procedure: HOT HEMOSTASIS (ARGON PLASMA COAGULATION/BICAP);   Surgeon: Juanita Craver, MD;  Location: [facility];  Service: Endoscopy;  Laterality: N/A;  . IR IMAGING GUIDED PORT INSERTION  07/05/2018  . POLYPECTOMY    . PORT-A-CATH REMOVAL Right 05/20/2020   Procedure: REMOVAL PORT-A-CATH;  Surgeon: Rexene Alberts, MD;  Location: [place];  Service: Thoracic;  Laterality: Right;  . RIGHT/LEFT HEART CATH AND CORONARY ANGIOGRAPHY N/A 02/13/2020   Procedure: RIGHT/LEFT HEART CATH AND CORONARY ANGIOGRAPHY;  Surgeon: Belva Crome, MD;  Location: [place] [facility];  Service: Cardiovascular;  Laterality: N/A;  . SUBMUCOSAL INJECTION  05/02/2020   Procedure: SUBMUCOSAL INJECTION;  Surgeon: Irving Copas., MD;  Location: [facility];  Service: Gastroenterology;;  . TEE WITHOUT CARDIOVERSION N/A 05/14/2020   Procedure: TRANSESOPHAGEAL ECHOCARDIOGRAM (TEE);  Surgeon: Sanda Klein, MD;  Location: [place];  Service: Cardiovascular;  Laterality: N/A;  . TEE WITHOUT CARDIOVERSION N/A 05/15/2020   Procedure: TRANSESOPHAGEAL ECHOCARDIOGRAM (TEE);  Surgeon: Rexene Alberts, MD;  Location: [place];  Service: Open Heart Surgery;  Laterality: N/A;    Family History  Problem Relation Age of Onset  . Hypertension Other   . Breast cancer Sister   . Breast cancer Other        family history  . Breast cancer Brother        prostate ca  . Aneurysm Mother        Brain aneurysm   . Prostate cancer Father   . Colon cancer Neg Hx   . Esophageal cancer Neg Hx   . Stomach cancer Neg Hx   . Rectal cancer Neg Hx     No Known Allergies  Current Outpatient Medications on File Prior to Visit  Medication Sig Dispense Refill  . abiraterone acetate (ZYTIGA) 250 MG tablet TAKE 4 TABLETS (1,000 MG TOTAL) BY MOUTH DAILY. TAKE ON AN EMPTY STOMACH 1 HOUR BEFORE OR 2 HOURS AFTER A MEAL 120 tablet 0  . aspirin EC 81 MG tablet Take 81 mg by mouth daily. Swallow whole.    Marland Kitchen atorvastatin (LIPITOR) 40 MG tablet TAKE 1 TABLET EVERY DAY 90 tablet 2  . cholecalciferol (VITAMIN  D3) 25 MCG (1000 UT) tablet Take 1,000 Units by mouth daily.    . ferrous sulfate 325 (65 FE) MG EC tablet Take 1 tablet (325 mg total) by mouth 2 (two) times daily before a meal. 60 tablet 2  . glipiZIDE (GLUCOTROL XL) 10 MG 24 hr tablet TAKE 1 TABLET TWICE DAILY 180 tablet 0  . labetalol (NORMODYNE) 200 MG  tablet Take 3 tablets by mouth in the morning and 2 tablets by mouth in the evening    . levothyroxine (SYNTHROID) 150 MCG tablet TAKE 1 TABLET EVERY DAY 90 tablet 0  . metFORMIN (GLUCOPHAGE) 1000 MG tablet TAKE 1 TABLET TWICE DAILY WITH MEALS 180 tablet 1  . omeprazole (PRILOSEC) 40 MG capsule TAKE 1 CAPSULE BY MOUTH EVERY DAY 90 capsule 1  . potassium chloride SA (KLOR-CON) 20 MEQ tablet Take 2 tablets (40 mEq total) by mouth 2 (two) times daily. 360 tablet 3  . predniSONE (DELTASONE) 5 MG tablet TAKE 1 TABLET (5 MG TOTAL) BY MOUTH DAILY WITH BREAKFAST. 90 tablet 3  . torsemide (DEMADEX) 20 MG tablet Take 1 tablet (20 mg total) by mouth 2 (two) times daily. 180 tablet 3  . doxazosin (CARDURA) 8 MG tablet TAKE 1 TABLET AT BEDTIME 90 tablet 3   No current facility-administered medications on file prior to visit.    BP (!) 90/58 (BP Location: Left Arm, Patient Position: Sitting, Cuff Size: Large)   Pulse 66   Temp 98.4 F (36.9 C) (Oral)   Ht 5\' 7"  (1.702 m)   Wt 185 lb 3.2 oz (84 kg)   BMI 29.01 kg/m       Objective:   Physical Exam Vitals and nursing note reviewed.  Constitutional:      Appearance: Normal appearance. He is ill-appearing.  HENT:     Right Ear: Tympanic membrane, ear canal and external ear normal. There is no impacted cerumen.     Left Ear: Tympanic membrane, ear canal and external ear normal. There is no impacted cerumen.     Nose: Nose normal.     Mouth/Throat:     Dentition: Abnormal dentition.  Eyes:     Extraocular Movements: Extraocular movements intact.     Pupils: Pupils are equal, round, and reactive to light.  Cardiovascular:     Rate and Rhythm:  Normal rate and regular rhythm.     Pulses: Normal pulses.     Heart sounds: Murmur (RUSB) heard.   Pulmonary:     Effort: Pulmonary effort is normal. No respiratory distress.     Breath sounds: Normal breath sounds. No stridor. No wheezing, rhonchi or rales.  Chest:     Chest wall: No tenderness.  Abdominal:     General: Abdomen is flat. Bowel sounds are normal. There is no distension.     Palpations: Abdomen is soft. There is no mass.     Tenderness: There is no abdominal tenderness. There is no right CVA tenderness, left CVA tenderness, guarding or rebound.     Hernia: No hernia is present.  Musculoskeletal:        General: No swelling, tenderness or deformity. Normal range of motion.     Right lower leg: No edema.     Left lower leg: No edema.  Skin:    General: Skin is warm and dry.     Capillary Refill: Capillary refill takes less than 2 seconds.     Coloration: Skin is not jaundiced or pale.     Findings: No bruising, erythema, lesion or rash.  Neurological:     General: No focal deficit present.     Mental Status: He is alert and oriented to person, place, and time.     Cranial Nerves: No cranial nerve deficit.     Sensory: No sensory deficit.     Motor: No weakness.     Coordination: Coordination normal.  Gait: Gait normal.     Deep Tendon Reflexes: Reflexes normal.  Psychiatric:        Mood and Affect: Mood normal.        Behavior: Behavior normal.        Thought Content: Thought content normal.        Judgment: Judgment normal.       Assessment & Plan:  1. Type 2 diabetes mellitus without complication, without long-term current use of insulin [facility]) - May need to decrease glipizide  - CBC with Differential/Platelet; Future - Comprehensive metabolic panel; Future - Hemoglobin A1c; Future - Lipid panel; Future - TSH; Future - TSH - Lipid panel - Hemoglobin A1c - Comprehensive metabolic panel - CBC with Differential/Platelet  2. Essential  hypertension - No change in medications  - CBC with Differential/Platelet; Future - Comprehensive metabolic panel; Future - Hemoglobin A1c; Future - Lipid panel; Future - TSH; Future - Iron, TIBC and Ferritin Panel; Future - Iron, TIBC and Ferritin Panel - TSH - Lipid panel - Hemoglobin A1c - Comprehensive metabolic panel - CBC with Differential/Platelet  3. Mixed hyperlipidemia - Consider dose change of synthroid  - CBC with Differential/Platelet; Future - Comprehensive metabolic panel; Future - Hemoglobin A1c; Future - Lipid panel; Future - TSH; Future - Iron, TIBC and Ferritin Panel; Future - Iron, TIBC and Ferritin Panel - TSH - Lipid panel - Hemoglobin A1c - Comprehensive metabolic panel - CBC with Differential/Platelet  4. Hypothyroidism, unspecified type - Consider increase in synthroid  - CBC with Differential/Platelet; Future - Comprehensive metabolic panel; Future - Hemoglobin A1c; Future - Lipid panel; Future - TSH; Future - TSH - Lipid panel - Hemoglobin A1c - Comprehensive metabolic panel - CBC with Differential/Platelet  5. Malignant neoplasm of prostate ([place]) - Follow up with Endocrinology as directed - PSA; Future - PSA  6. Need for immunization against influenza  - Flu Vaccine QUAD High Dose(Fluad)  7. S/P aortic root replacement with human allograft Follow up with Cardiothoracic surgery as directed  8. Aortic valve endocarditis - Follow up with Cardiothoracic's and ID as directed  9. Severe aortic insufficiency - Follow up with Cardiology as directed   [name], NP

## 2020-07-29 NOTE — Patient Instructions (Signed)
It was great seeing you today and I am so glad you are starting to feel better.   I will follow up with you regarding your blood work   Lets plan on seeing each other in 3 months

## 2020-07-30 ENCOUNTER — Other Ambulatory Visit: Payer: Self-pay | Admitting: *Deleted

## 2020-07-30 ENCOUNTER — Telehealth: Payer: Self-pay | Admitting: Adult Health

## 2020-07-30 ENCOUNTER — Encounter: Payer: Self-pay | Admitting: Adult Health

## 2020-07-30 LAB — HEMOGLOBIN A1C
Hgb A1c MFr Bld: 5.6 % of total Hgb (ref <5.7)
Mean Plasma Glucose: 114 mg/dL
eAG (mmol/L): 6.3 mmol/L

## 2020-07-30 LAB — COMPREHENSIVE METABOLIC PANEL
AG Ratio: 1.5 (calc) (ref 1.0–2.5)
ALT: 14 U/L (ref 9–46)
AST: 13 U/L (ref 10–35)
Albumin: 3.7 g/dL (ref 3.6–5.1)
Alkaline phosphatase (APISO): 78 U/L (ref 35–144)
BUN: 19 mg/dL (ref 7–25)
CO2: 25 mmol/L (ref 20–32)
Calcium: 9.5 mg/dL (ref 8.6–10.3)
Chloride: 110 mmol/L (ref 98–110)
Creat: 1.13 mg/dL (ref 0.70–1.18)
Globulin: 2.4 g/dL (calc) (ref 1.9–3.7)
Glucose, Bld: 81 mg/dL (ref 65–99)
Potassium: 5.7 mmol/L — ABNORMAL HIGH (ref 3.5–5.3)
Sodium: 146 mmol/L (ref 135–146)
Total Bilirubin: 0.6 mg/dL (ref 0.2–1.2)
Total Protein: 6.1 g/dL (ref 6.1–8.1)

## 2020-07-30 LAB — CBC WITH DIFFERENTIAL/PLATELET
Absolute Monocytes: 543 cells/uL (ref 200–950)
Basophils Absolute: 57 cells/uL (ref 0–200)
Basophils Relative: 0.7 %
Eosinophils Absolute: 162 cells/uL (ref 15–500)
Eosinophils Relative: 2 %
HCT: 34.2 % — ABNORMAL LOW (ref 38.5–50.0)
Hemoglobin: 10.8 g/dL — ABNORMAL LOW (ref 13.2–17.1)
Lymphs Abs: 2163 cells/uL (ref 850–3900)
MCH: 28.3 pg (ref 27.0–33.0)
MCHC: 31.6 g/dL — ABNORMAL LOW (ref 32.0–36.0)
MCV: 89.5 fL (ref 80.0–100.0)
MPV: 10.7 fL (ref 7.5–12.5)
Monocytes Relative: 6.7 %
Neutro Abs: 5176 cells/uL (ref 1500–7800)
Neutrophils Relative %: 63.9 %
Platelets: 208 10*3/uL (ref 140–400)
RBC: 3.82 10*6/uL — ABNORMAL LOW (ref 4.20–5.80)
RDW: 13.7 % (ref 11.0–15.0)
Total Lymphocyte: 26.7 %
WBC: 8.1 10*3/uL (ref 3.8–10.8)

## 2020-07-30 LAB — LIPID PANEL
Cholesterol: 153 mg/dL (ref <200)
HDL: 48 mg/dL (ref >=40)
LDL Cholesterol (Calc): 82 mg/dL (calc)
Non-HDL Cholesterol (Calc): 105 mg/dL (calc) (ref <130)
Total CHOL/HDL Ratio: 3.2 (calc) (ref <5.0)
Triglycerides: 129 mg/dL (ref <150)

## 2020-07-30 LAB — IRON,TIBC AND FERRITIN PANEL
%SAT: 19 % (calc) — ABNORMAL LOW (ref 20–48)
Ferritin: 235 ng/mL (ref 24–380)
Iron: 51 ug/dL (ref 50–180)
TIBC: 268 mcg/dL (calc) (ref 250–425)

## 2020-07-30 LAB — TSH: TSH: 0.62 mIU/L (ref 0.40–4.50)

## 2020-07-30 LAB — PSA: PSA: <0.04 ng/mL (ref <=4.0)

## 2020-07-30 NOTE — Patient Outreach (Signed)
[facility] [facility]) Care Management  07/30/2020  Daved Mcfann. 1946-07-01 [id number]  Telephone outreach, Unsuccessful, left message and requested a return call.  [name]. [name], MSN, [facility] Gerontological Nurse Practitioner [facility] Care Management [phone]

## 2020-07-30 NOTE — Telephone Encounter (Signed)
I updated patient on his labs.  His A1c has dropped to 5.6, since he is having episodes of hypoglycemia, will cut glipizide from twice daily dosing to daily dosing.  He was advised to follow-up in 3 months or sooner if he continues to have episodes of hypoglycemia.  His iron panel and hemoglobin are improving as well.  All other labs are normal

## 2020-08-05 ENCOUNTER — Other Ambulatory Visit: Payer: Self-pay | Admitting: *Deleted

## 2020-08-05 NOTE — Patient Outreach (Signed)
[facility]) Care Management  08/05/2020  Matthew Serrano. 06-28-1946 [id number]  Telephone outreach to follow up on cardiac status.  Matthew Serrano reports he is doing pretty well. He remains sore from his procedure but is getting better. He is trying to be a bit more active. He denies SOB and edema. Weight is stable about 185.  Diabetes: well controlled, FBS range 76-109, HgbA1C 5.6.  Assessment: HF Stable             Diabetes at goal  Goals Addressed            This Visit's Progress   . Monitor and Manage My Blood Sugar   On track    Follow Up Date 09/06/20   - check blood sugar at prescribed times - check blood sugar before and after exercise - check blood sugar if I feel it is too high or too low - enter blood sugar readings and medication or insulin into daily log - take the blood sugar log to all doctor visits - take the blood sugar meter to all doctor visits    Why is this important?   Checking your blood sugar at home helps to keep it from getting very high or very low.  Writing the results in a diary or log helps the doctor know how to care for you.  Your blood sugar log should have the time, date and the results.  Also, write down the amount of insulin or other medicine that you take.  Other information, like what you ate, exercise done and how you were feeling, will also be helpful.     Notes: Glucose levels almost always <120 fasting. Has [facility] disease mgmt calendar to record gluocse levels and wts. Has new educational tool, All About Carbs to review and start using to improve his dietary intake and reduce carbs. 08/05/20 Matthew Serrano' FBS levels are ranging from 76-109. His HgbA1C was 5.6 this month! He denies any hypoglycemic sxs. He does take his record to his MD.     . Obtain Eye Exam       Follow Up Date 09/06/20  - keep appointment with eye doctor - schedule appointment with eye doctor    Why is this important?   Eye check-ups are important  when you have diabetes.  Vision loss can be prevented.    Notes: Assess if has had his eye exam in last year. 08/05/20 Pt has not had an diabetic eye exam. Discussed and pt agree to call his MD and schedule an appt before we talk again in one month.    . Record weight daily       Follow up: 09/06/20  Pt to weigh daily and record.  Notes: 08/05/20. Pt is weighing daily. Wt varies between 182-185. Encouraged continuation and to log wts into his [facility] Calendar. Review HF ACTION PLAN.    Marland Kitchen Weight (lb) < 175 lb (79.4 kg)       Follow up 09/06/20  Will learn carb counting to help lose wt and improve DM management. 08/05/20 Pt has received the All About Carbs information NP sent. He has not really studied it to date. He plans on reviewing this with them. His priority task with this packet of information is to complete his HCPOA and Living Will. Encouraged to pull the carb counting info out of packet and start looking at it to learn what foods are considered carbs for our next conversation in January.  We agreed to talk again in one month.  Matthew Serrano. Matthew Neither, MSN, [facility] Gerontological Nurse Practitioner [facility] Care Management [phone]

## 2020-08-07 ENCOUNTER — Encounter: Payer: Self-pay | Admitting: Internal Medicine

## 2020-08-07 ENCOUNTER — Ambulatory Visit (INDEPENDENT_AMBULATORY_CARE_PROVIDER_SITE_OTHER): Payer: Medicare Other | Admitting: Internal Medicine

## 2020-08-07 ENCOUNTER — Other Ambulatory Visit: Payer: Self-pay

## 2020-08-07 DIAGNOSIS — I25709 Atherosclerosis of coronary artery bypass graft(s), unspecified, with unspecified angina pectoris: Secondary | ICD-10-CM

## 2020-08-07 DIAGNOSIS — I358 Other nonrheumatic aortic valve disorders: Secondary | ICD-10-CM

## 2020-08-07 NOTE — Assessment & Plan Note (Signed)
He is recovering uneventfully from his severe streptococcal infection and aortic root replacement.  Hopefully all of his dental surgery will be completed soon.  He will get IV dalbavancin 4 days before his upcoming visit with Dr. Benson Norway.  In the future he can take oral amoxicillin 2 g before dental procedures.  He will follow-up here in 3 weeks.

## 2020-08-07 NOTE — Progress Notes (Signed)
[facility] for Infectious Disease  Patient Active Problem List   Diagnosis Date Noted  . 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    Priority: High  . Abscess of aortic valve 05/14/2020    Priority: High  . Streptococcal bacteremia 05/13/2020    Priority: High  . Aortic valve endocarditis 05/13/2020    Priority: High  . Severe aortic insufficiency 05/13/2020    Priority: High  . Aortic stenosis     Priority: High  . Poor dentition 06/26/2020  . Streptococcal endocarditis 05/21/2020  . 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 History of GI bleed 05/14/2020  . Normocytic anemia 05/14/2020  . Endocarditis 05/14/2020  . 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 Acute CHF (congestive heart failure) ([place]) 05/13/2020  . Respiratory distress 05/13/2020  . Acute blood loss anemia 05/02/2020  . Malignant neoplasm of prostate ([place]) 06/23/2018  . Goals of care, counseling/discussion 06/23/2018  . Left inguinal hernia 09/04/2015  . Alcoholism ([place]) 06/10/2014  . Anemia of chronic disease 05/23/2014  . Elevated PSA 01/20/2012  . HEMORRHAGE OF RECTUM AND ANUS 12/15/2008  . ERECTILE DYSFUNCTION, MILD 12/14/2007  . SEBORRHEIC KERATOSIS, INFLAMED 08/03/2007  . NEOPLASM, SKIN, UNCERTAIN BEHAVIOR 91/47/8295  . Hypothyroidism 07/06/2007  . Diabetes mellitus ([place]) 07/06/2007  . Hyperlipidemia 07/06/2007  . Essential hypertension 07/06/2007    Patient's Medications  New Prescriptions   No medications on file  Previous Medications   ABIRATERONE ACETATE (ZYTIGA) 250 MG TABLET    TAKE 4 TABLETS (1,000 MG TOTAL) BY MOUTH DAILY. TAKE ON AN EMPTY STOMACH 1 HOUR BEFORE OR 2 HOURS AFTER A MEAL   ASPIRIN EC 81 MG TABLET    Take 81 mg by mouth daily. Swallow whole.   ATORVASTATIN (LIPITOR) 40 MG TABLET    TAKE 1 TABLET EVERY DAY   CHOLECALCIFEROL (VITAMIN D3) 25 MCG (1000 UT) TABLET    Take 1,000 Units by mouth daily.   DOXAZOSIN (CARDURA) 8  MG TABLET    TAKE 1 TABLET AT BEDTIME   FERROUS SULFATE 325 (65 FE) MG EC TABLET    Take 1 tablet (325 mg total) by mouth 2 (two) times daily before a meal.   GLIPIZIDE (GLUCOTROL XL) 10 MG 24 HR TABLET    TAKE 1 TABLET TWICE DAILY   LABETALOL (NORMODYNE) 200 MG TABLET    Take 3 tablets by mouth in the morning and 2 tablets by mouth in the evening   LEVOTHYROXINE (SYNTHROID) 150 MCG TABLET    TAKE 1 TABLET EVERY DAY   METFORMIN (GLUCOPHAGE) 1000 MG TABLET    TAKE 1 TABLET TWICE DAILY WITH MEALS   OMEPRAZOLE (PRILOSEC) 40 MG CAPSULE    TAKE 1 CAPSULE BY MOUTH EVERY DAY   POTASSIUM CHLORIDE SA (KLOR-CON) 20 MEQ TABLET    Take 2 tablets (40 mEq total) by mouth 2 (two) times daily.   PREDNISONE (DELTASONE) 5 MG TABLET    TAKE 1 TABLET (5 MG TOTAL) BY MOUTH DAILY WITH BREAKFAST.   TORSEMIDE (DEMADEX) 20 MG TABLET    Take 1 tablet (20 mg total) by mouth 2 (two) times daily.  Modified Medications   No medications on file  Discontinued Medications   No medications on file    Subjective: Matthew Serrano is in for his routine follow-up visit.  He developed streptococcal bacteremia complicated by aortic valve endocarditis in September.  He underwent aortic root replacement followed by 6 weeks of IV ceftriaxone  completing that therapy on 06/26/2020.  He has received 2 subsequent doses of IV dalbavancin on 06/27/2020 and 07/07/2020.  He is scheduled for dental surgery by Dr. Frederik Schmidt on 08/22/2020.  He will receive his third and hopefully final dose of IV dalbavancin on 08/18/2020.  He is feeling well.  He has not had any fever, chills or sweats.  Review of Systems: Review of Systems  Constitutional: Negative for chills, diaphoresis, fever and weight loss.  Respiratory: Negative for cough and shortness of breath.   Cardiovascular: Negative for chest pain.  Gastrointestinal: Negative for diarrhea, nausea and vomiting.    Past Medical History:  Diagnosis Date  . Aortic valve endocarditis 05/13/2020    STREPTOCOCCUS ANGINOSIS  . AVM (arteriovenous malformation) of colon   . Cancer [facility])    prostate cancer  . DIABETES MELLITUS, TYPE II 07/06/2007  . ED (erectile dysfunction)   . Elevated PSA 01/20/2012  . 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 History of nuclear stress test    Myoview 8/18: EF 64, inferobasal thinning, no ischemia, low risk  . HYPERLIPIDEMIA 07/06/2007  . HYPERTENSION 07/06/2007  . HYPOTHYROIDISM 07/06/2007  . Leg pain    ABIs 8/18:  Normal   . Obesity   . 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 Severe aortic insufficiency 05/13/2020   acute onset in setting of bacterial endocarditis  . Severe aortic stenosis     Social History   Tobacco Use  . Smoking status: Former Research scientist (life sciences)  . Smokeless tobacco: Never Used  . Tobacco comment: quit 2005  Vaping Use  . Vaping Use: Never used  Substance Use Topics  . Alcohol use: Not Currently    Alcohol/week: 21.0 standard drinks    Types: 21 Cans of beer per week  . Drug use: No    Family History  Problem Relation Age of Onset  . Hypertension Other   . Breast cancer Sister   . Breast cancer Other        family history  . Breast cancer Brother        prostate ca  . Aneurysm Mother        Brain aneurysm   . Prostate cancer Father   . Colon cancer Neg Hx   . Esophageal cancer Neg Hx   . Stomach cancer Neg Hx   . Rectal cancer Neg Hx     No Known Allergies  Objective: Vitals:   08/07/20 0843  BP: 126/81  Pulse: 71  Weight: 192 lb (87.1 kg)   Body mass index is 30.07 kg/m.  Physical Exam Constitutional:      Comments: His spirits are good.  Cardiovascular:     Rate and Rhythm: Normal rate and regular rhythm.  Pulmonary:     Effort: Pulmonary effort is normal.     Breath sounds: Normal breath sounds.  Psychiatric:         Mood and Affect: Mood normal.       Problem List Items Addressed This Visit      High   Aortic valve endocarditis    He is recovering uneventfully from his severe streptococcal infection and aortic root replacement.  Hopefully all of his dental surgery will be completed soon.  He will get IV dalbavancin 4 days before his upcoming visit with Dr. Benson Norway.  In the future he can take oral amoxicillin 2 g before dental procedures.  He will follow-up here in 3 weeks.          Michel Bickers, MD [facility] [phone] pager   [phone] cell 08/07/2020, 8:55 AM

## 2020-08-13 ENCOUNTER — Other Ambulatory Visit: Payer: Self-pay | Admitting: Adult Health

## 2020-08-14 ENCOUNTER — Other Ambulatory Visit (HOSPITAL_COMMUNITY): Payer: Self-pay | Admitting: *Deleted

## 2020-08-14 ENCOUNTER — Telehealth: Payer: Self-pay

## 2020-08-14 NOTE — Telephone Encounter (Signed)
Spoke with the patient to remind him of his upcoming appointment at [facility] short stay for antibiotic infusion Monday 08/18/20 at 10am. Patient confirmed he would be there.   Beryle Flock, RN

## 2020-08-18 ENCOUNTER — Ambulatory Visit (HOSPITAL_COMMUNITY)
Admission: RE | Admit: 2020-08-18 | Discharge: 2020-08-18 | Disposition: A | Discharge status: Home / Self Care | Payer: Medicare Other | Source: Ambulatory Visit | Attending: Internal Medicine | Admitting: Internal Medicine

## 2020-08-18 DIAGNOSIS — I358 Other nonrheumatic aortic valve disorders: Secondary | ICD-10-CM | POA: Diagnosis not present

## 2020-08-18 MED ORDER — DEXTROSE 5 % IV SOLN
1000.0000 mg | Freq: Once | INTRAVENOUS | Status: AC
  Administered 2020-08-18: 10:00:00 1000 mg via INTRAVENOUS
  Filled 2020-08-18: qty 50

## 2020-08-19 ENCOUNTER — Other Ambulatory Visit: Payer: Self-pay | Admitting: Oncology

## 2020-08-19 DIAGNOSIS — C61 Malignant neoplasm of prostate: Secondary | ICD-10-CM

## 2020-08-26 ENCOUNTER — Other Ambulatory Visit: Payer: Self-pay

## 2020-08-26 ENCOUNTER — Inpatient Hospital Stay: Payer: Medicare Other | Attending: Oncology

## 2020-08-26 ENCOUNTER — Inpatient Hospital Stay (HOSPITAL_BASED_OUTPATIENT_CLINIC_OR_DEPARTMENT_OTHER): Payer: Medicare Other | Admitting: Oncology

## 2020-08-26 ENCOUNTER — Inpatient Hospital Stay: Payer: Medicare Other

## 2020-08-26 VITALS — BP 115/72 | HR 72 | Temp 97.5°F | Resp 18 | Wt 190.7 lb

## 2020-08-26 DIAGNOSIS — Z79899 Other long term (current) drug therapy: Secondary | ICD-10-CM | POA: Diagnosis not present

## 2020-08-26 DIAGNOSIS — E876 Hypokalemia: Secondary | ICD-10-CM | POA: Diagnosis not present

## 2020-08-26 DIAGNOSIS — R591 Generalized enlarged lymph nodes: Secondary | ICD-10-CM | POA: Diagnosis not present

## 2020-08-26 DIAGNOSIS — C61 Malignant neoplasm of prostate: Secondary | ICD-10-CM | POA: Diagnosis not present

## 2020-08-26 DIAGNOSIS — I10 Essential (primary) hypertension: Secondary | ICD-10-CM | POA: Insufficient documentation

## 2020-08-26 LAB — CBC WITH DIFFERENTIAL (CANCER CENTER ONLY)
Abs Immature Granulocytes: 0.02 10*3/uL (ref 0.00–0.07)
Basophils Absolute: 0 10*3/uL (ref 0.0–0.1)
Basophils Relative: 1 %
Eosinophils Absolute: 0.1 10*3/uL (ref 0.0–0.5)
Eosinophils Relative: 1 %
HCT: 30.4 % — ABNORMAL LOW (ref 39.0–52.0)
Hemoglobin: 9.9 g/dL — ABNORMAL LOW (ref 13.0–17.0)
Immature Granulocytes: 0 %
Lymphocytes Relative: 26 %
Lymphs Abs: 1.5 10*3/uL (ref 0.7–4.0)
MCH: 28.1 pg (ref 26.0–34.0)
MCHC: 32.6 g/dL (ref 30.0–36.0)
MCV: 86.4 fL (ref 80.0–100.0)
Monocytes Absolute: 0.5 10*3/uL (ref 0.1–1.0)
Monocytes Relative: 8 %
Neutro Abs: 3.8 10*3/uL (ref 1.7–7.7)
Neutrophils Relative %: 64 %
Platelet Count: 186 10*3/uL (ref 150–400)
RBC: 3.52 MIL/uL — ABNORMAL LOW (ref 4.22–5.81)
RDW: 14.5 % (ref 11.5–15.5)
WBC Count: 6 10*3/uL (ref 4.0–10.5)
nRBC: 0 % (ref 0.0–0.2)

## 2020-08-26 LAB — CMP (CANCER CENTER ONLY)
ALT: 12 U/L (ref 0–44)
AST: 14 U/L — ABNORMAL LOW (ref 15–41)
Albumin: 3.5 g/dL (ref 3.5–5.0)
Alkaline Phosphatase: 65 U/L (ref 38–126)
Anion gap: 8 (ref 5–15)
BUN: 7 mg/dL — ABNORMAL LOW (ref 8–23)
CO2: 27 mmol/L (ref 22–32)
Calcium: 9.1 mg/dL (ref 8.9–10.3)
Chloride: 105 mmol/L (ref 98–111)
Creatinine: 0.99 mg/dL (ref 0.61–1.24)
GFR, Estimated: >60 mL/min (ref >60)
Glucose, Bld: 109 mg/dL — ABNORMAL HIGH (ref 70–99)
Potassium: 3.7 mmol/L (ref 3.5–5.1)
Sodium: 140 mmol/L (ref 135–145)
Total Bilirubin: 0.8 mg/dL (ref 0.3–1.2)
Total Protein: 6.2 g/dL — ABNORMAL LOW (ref 6.5–8.1)

## 2020-08-26 MED ORDER — LEUPROLIDE ACETATE (4 MONTH) 30 MG ~~LOC~~ KIT
30.0000 mg | PACK | Freq: Once | SUBCUTANEOUS | Status: AC
  Administered 2020-08-26: 30 mg via SUBCUTANEOUS

## 2020-08-26 NOTE — Progress Notes (Signed)
Hematology and Oncology Follow Up Visit  Matthew Serrano [id number] [birth year] 75 y.o. 08/26/2020 12:28 PM [name], [name], [name], NP   Principle Diagnosis: 75 year old man with advanced prostate cancer with lymphadenopathy diagnosed in 2019.  He castration-resistant disease after presenting with PSA of 1170 and a Gleason score 4+5 = 9.   Prior Therapy:  Status post prostate biopsy obtained on 06/06/2018 which showed high-volume disease with a Gleason score 4+5 = 9.  Taxotere chemotherapy at 75 mg/m every 3 weeks started on 07/07/2018.  He he is here for cycle 6 of therapy.  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  Current therapy:   Eligard 30 mg every 4 months.  Last injection given in September 2021.  Zytiga 1000 mg daily started in April 2020.  Interim History: Matthew Serrano returns today for a follow-up visit.  Since the last visit, he had a prolonged hospitalization after bacterial endocarditis in September 2021.  He developed severe aortic insufficiency that required surgical replacement and prolonged rehabilitation process.  His Port-A-Cath was also removed and required long-term antibiotics.  He is currently recovering reasonably well and has resumed most cavities of daily living.  He is currently on Zytiga without any treatment interruption.  He had denies any bone pain or pathological fractures.  Denies any fevers chills or sweats and is no longer taking any antibiotics at this time but will take oral amoxicillin for dental procedures.                   Medications: Updated on review. Current Outpatient Medications  Medication Sig Dispense Refill  . abiraterone acetate (ZYTIGA) 250 MG tablet TAKE 4 TABLETS (1,000 MG TOTAL) BY MOUTH DAILY. TAKE ON AN EMPTY STOMACH 1 HOUR BEFORE OR 2 HOURS AFTER A MEAL 120 tablet 0  . aspirin EC 81 MG tablet Take 81 mg by mouth daily. Swallow whole.    Marland Kitchen atorvastatin  (LIPITOR) 40 MG tablet TAKE 1 TABLET EVERY DAY 90 tablet 2  . cholecalciferol (VITAMIN D3) 25 MCG (1000 UT) tablet Take 1,000 Units by mouth daily.    Marland Kitchen doxazosin (CARDURA) 8 MG tablet TAKE 1 TABLET AT BEDTIME 90 tablet 3  . ferrous sulfate 325 (65 FE) MG EC tablet Take 1 tablet (325 mg total) by mouth 2 (two) times daily before a meal. 60 tablet 2  . glipiZIDE (GLUCOTROL XL) 10 MG 24 hr tablet TAKE 1 TABLET TWICE DAILY (Patient taking differently: Take 10 mg by mouth daily with breakfast. TAKE 1 TABLET TWICE DAILY) 180 tablet 0  . labetalol (NORMODYNE) 200 MG tablet Take 3 tablets by mouth in the morning and 2 tablets by mouth in the evening    . levothyroxine (SYNTHROID) 150 MCG tablet TAKE 1 TABLET EVERY DAY 90 tablet 0  . metFORMIN (GLUCOPHAGE) 1000 MG tablet Take 1 tablet (1,000 mg total) by mouth 2 (two) times daily with a meal. OFFICE VISIT NEEDED FOR ADDITIONAL REFILLS 180 tablet 0  . omeprazole (PRILOSEC) 40 MG capsule TAKE 1 CAPSULE BY MOUTH EVERY DAY 90 capsule 1  . potassium chloride SA (KLOR-CON) 20 MEQ tablet Take 2 tablets (40 mEq total) by mouth 2 (two) times daily. 360 tablet 3  . predniSONE (DELTASONE) 5 MG tablet TAKE 1 TABLET (5 MG TOTAL) BY MOUTH DAILY WITH BREAKFAST. 90 tablet 3  . torsemide (DEMADEX) 20 MG tablet Take 1 tablet (20 mg total) by mouth 2 (two) times daily. 180 tablet  3   No current facility-administered medications for this visit.     Allergies: No Known Allergies      Physical Exam:   Blood pressure 115/72, pulse 72, temperature (!) 97.5 F (36.4 C), temperature source Tympanic, resp. rate 18, weight 190 lb 11.2 oz (86.5 kg), SpO2 96 %.        ECOG: 0   General appearance: Alert, awake without any distress. Head: Atraumatic without abnormalities Oropharynx: Without any thrush or ulcers. Eyes: No scleral icterus. Lymph nodes: No lymphadenopathy noted in the cervical, supraclavicular, or axillary nodes Heart:regular rate and rhythm,  without any murmurs or gallops.   Lung: Clear to auscultation without any rhonchi, wheezes or dullness to percussion. Abdomin: Soft, nontender without any shifting dullness or ascites. Musculoskeletal: No clubbing or cyanosis. Neurological: No motor or sensory deficits. Skin: No rashes or lesions.             Lab Results: Lab Results  Component Value Date   WBC 6.0 08/26/2020   HGB 9.9 (L) 08/26/2020   HCT 30.4 (L) 08/26/2020   MCV 86.4 08/26/2020   PLT 186 08/26/2020     Chemistry      Component Value Date/Time   NA 146 2020/08/11 0831   NA 141 07/03/2020 1016   K 5.7 (H) 2020-08-11 0831   CL 110 08/11/2020 0831   CO2 25 08/11/2020 0831   BUN 19 2020-08-11 0831   BUN 12 07/03/2020 1016   CREATININE 1.13 08/11/20 0831      Component Value Date/Time   CALCIUM 9.5 11-Aug-2020 0831   ALKPHOS 69 07/07/2020 1122   AST 13 2020-08-11 0831   AST 17 04/29/2020 0948   ALT 14 2020-08-11 0831   ALT 13 04/29/2020 0948   BILITOT 0.6 11-Aug-2020 0831   BILITOT 0.6 06/30/2020 1516   BILITOT 0.6 04/29/2020 0948             Results for Matthew Serrano, Matthew Serrano (MRN [id number]) as of 08/26/2020 12:29  Ref. Range 04/29/2020 09:48 11-Aug-2020 08:31  PSA Latest Ref Range: < OR = 4.0 ng/mL  <0.04  Prostate Specific Ag, Serum Latest Ref Range: 0.0 - 4.0 ng/mL <0.1          Impression and Plan:  75 year old man with:  1.  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     The natural course of his disease was updated at this time and treatment options were reviewed.  He is currently on Zytiga with excellent PSA response that continues to be undetectable approaching 2 years of therapy.  Risks and benefits of continuing this treatment long-term.  Complication clinic hypertension, adrenal sufficiency and hypokalemia.  Alternative options would be retreatment with chemotherapy if he develops worsening disease progression.  2.  IV access:  Port-A-Cath has been removed because of endocarditis.  3.  Androgen deprivation: He will receive Eligard today and repeated in 4 months.  Complications including weight gain, hot flashes among others.  5.  Goals of care and prognosis: His disease is incurable although aggressive measures are warranted given his reasonable performance status and excellent PSA response.  6.  Hypokalemia: Related to Zytiga and potassium has been repeated today currently pending.  Last potassium was slightly elevated.  7.  Hypertension: Close to normal range at this time.  8.  Iron deficiency anemia: Resolved with iron studies close to normal range on 08/11/08.  Follow-up: In 4 months for repeat follow-up.  30  minutes were  spent on this encounter.  The time was dedicated to reviewing his disease status, reviewing laboratory data and treatment options for the future.      Eli Hose, MD 1/4/202212:28 PM

## 2020-08-27 ENCOUNTER — Telehealth: Payer: Self-pay | Admitting: *Deleted

## 2020-08-27 ENCOUNTER — Other Ambulatory Visit: Payer: Self-pay | Admitting: Adult Health

## 2020-08-27 LAB — PROSTATE-SPECIFIC AG, SERUM (LABCORP): Prostate Specific Ag, Serum: <0.1 ng/mL (ref 0.0–4.0)

## 2020-08-27 MED FILL — ABIRATERONE ACETATE 250 MG: 250 | 30 days supply | Qty: 120 | Fill #0

## 2020-08-27 NOTE — Telephone Encounter (Signed)
Notified of message below

## 2020-08-27 NOTE — Telephone Encounter (Signed)
-----   Message from Benjiman Core, MD sent at 08/27/2020  8:39 AM EST ----- Please let him know his PSA is low

## 2020-08-28 ENCOUNTER — Encounter: Payer: Self-pay | Admitting: Internal Medicine

## 2020-08-28 ENCOUNTER — Other Ambulatory Visit: Payer: Self-pay

## 2020-08-28 ENCOUNTER — Ambulatory Visit (INDEPENDENT_AMBULATORY_CARE_PROVIDER_SITE_OTHER): Payer: Medicare Other | Admitting: Internal Medicine

## 2020-08-28 DIAGNOSIS — I358 Other nonrheumatic aortic valve disorders: Secondary | ICD-10-CM | POA: Diagnosis not present

## 2020-08-28 NOTE — Progress Notes (Signed)
[facility] for Infectious Disease  Patient Active Problem List   Diagnosis Date Noted  . 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    Priority: High  . Abscess of aortic valve 05/14/2020    Priority: High  . Streptococcal bacteremia 05/13/2020    Priority: High  . Aortic valve endocarditis 05/13/2020    Priority: High  . Severe aortic insufficiency 05/13/2020    Priority: High  . Aortic stenosis     Priority: High  . Poor dentition 06/26/2020  . Streptococcal endocarditis 05/21/2020  . 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 History of GI bleed 05/14/2020  . Normocytic anemia 05/14/2020  . Endocarditis 05/14/2020  . 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 Acute CHF (congestive heart failure) ([place]) 05/13/2020  . Respiratory distress 05/13/2020  . Acute blood loss anemia 05/02/2020  . Malignant neoplasm of prostate ([place]) 06/23/2018  . Goals of care, counseling/discussion 06/23/2018  . Left inguinal hernia 09/04/2015  . Alcoholism ([place]) 06/10/2014  . Anemia of chronic disease 05/23/2014  . Elevated PSA 01/20/2012  . HEMORRHAGE OF RECTUM AND ANUS 12/15/2008  . ERECTILE DYSFUNCTION, MILD 12/14/2007  . SEBORRHEIC KERATOSIS, INFLAMED 08/03/2007  . NEOPLASM, SKIN, UNCERTAIN BEHAVIOR [id number]  . Hypothyroidism 07/06/2007  . Diabetes mellitus ([place]) 07/06/2007  . Hyperlipidemia 07/06/2007  . Essential hypertension 07/06/2007    Patient's Medications  New Prescriptions   No medications on file  Previous Medications   ABIRATERONE ACETATE (ZYTIGA) 250 MG TABLET    TAKE 4 TABLETS (1,000 MG TOTAL) BY MOUTH DAILY. TAKE ON AN EMPTY STOMACH 1 HOUR BEFORE OR 2 HOURS AFTER A MEAL   ASPIRIN EC 81 MG TABLET    Take 81 mg by mouth daily. Swallow whole.   ATORVASTATIN (LIPITOR) 40 MG TABLET    TAKE 1 TABLET EVERY DAY   CHOLECALCIFEROL (VITAMIN D3) 25 MCG (1000 UT) TABLET    Take 1,000 Units by mouth daily.   DOXAZOSIN (CARDURA) 8  MG TABLET    TAKE 1 TABLET AT BEDTIME   FERROUS SULFATE 325 (65 FE) MG EC TABLET    Take 1 tablet (325 mg total) by mouth 2 (two) times daily before a meal.   GLIPIZIDE (GLUCOTROL XL) 10 MG 24 HR TABLET    TAKE 1 TABLET TWICE DAILY   LABETALOL (NORMODYNE) 200 MG TABLET    Take 3 tablets by mouth in the morning and 2 tablets by mouth in the evening   LEVOTHYROXINE (SYNTHROID) 150 MCG TABLET    TAKE 1 TABLET EVERY DAY   METFORMIN (GLUCOPHAGE) 1000 MG TABLET    Take 1 tablet (1,000 mg total) by mouth 2 (two) times daily with a meal. OFFICE VISIT NEEDED FOR ADDITIONAL REFILLS   OMEPRAZOLE (PRILOSEC) 40 MG CAPSULE    TAKE 1 CAPSULE BY MOUTH EVERY DAY   POTASSIUM CHLORIDE SA (KLOR-CON) 20 MEQ TABLET    Take 2 tablets (40 mEq total) by mouth 2 (two) times daily.   PREDNISONE (DELTASONE) 5 MG TABLET    TAKE 1 TABLET (5 MG TOTAL) BY MOUTH DAILY WITH BREAKFAST.   TORSEMIDE (DEMADEX) 20 MG TABLET    Take 1 tablet (20 mg total) by mouth 2 (two) times daily.  Modified Medications   No medications on file  Discontinued Medications   No medications on file    Subjective: Matthew Serrano is in for his routine follow-up visit.  He developed streptococcal bacteremia complicated by aortic valve endocarditis in  September.  He underwent aortic root replacement followed by 6 weeks of IV ceftriaxone completing that therapy on 06/26/2020.  He has received 2 subsequent doses of IV dalbavancin on 06/27/2020, 07/07/2020 and 08/18/2020.  He underwent extraction of his remaining upper teeth by Dr. Frederik Schmidt on 08/22/2020.  He is feeling well.  He has not had any fever, chills or sweats.  Review of Systems: Review of Systems  Constitutional: Negative for chills, diaphoresis and fever.  Gastrointestinal: Negative for abdominal pain, diarrhea, nausea and vomiting.    Past Medical History:  Diagnosis Date  . Aortic valve endocarditis 05/13/2020   STREPTOCOCCUS ANGINOSIS  . AVM (arteriovenous malformation) of colon   . Cancer  [facility])    prostate cancer  . DIABETES MELLITUS, TYPE II 07/06/2007  . ED (erectile dysfunction)   . Elevated PSA 01/20/2012  . 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 History of nuclear stress test    Myoview 8/18: EF 64, inferobasal thinning, no ischemia, low risk  . HYPERLIPIDEMIA 07/06/2007  . HYPERTENSION 07/06/2007  . HYPOTHYROIDISM 07/06/2007  . Leg pain    ABIs 8/18:  Normal   . Obesity   . 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 Severe aortic insufficiency 05/13/2020   acute onset in setting of bacterial endocarditis  . Severe aortic stenosis     Social History   Tobacco Use  . Smoking status: Former Research scientist (life sciences)  . Smokeless tobacco: Never Used  . Tobacco comment: quit 2005  Vaping Use  . Vaping Use: Never used  Substance Use Topics  . Alcohol use: Not Currently    Alcohol/week: 21.0 standard drinks    Types: 21 Cans of beer per week  . Drug use: No    Family History  Problem Relation Age of Onset  . Hypertension Other   . Breast cancer Sister   . Breast cancer Other        family history  . Breast cancer Brother        prostate ca  . Aneurysm Mother        Brain aneurysm   . Prostate cancer Father   . Colon cancer Neg Hx   . Esophageal cancer Neg Hx   . Stomach cancer Neg Hx   . Rectal cancer Neg Hx     No Known Allergies  Objective: Vitals:   08/28/20 0836  BP: 133/79  Pulse: 73  Temp: 98.1 F (36.7 C)  TempSrc: Oral  Weight: 185 lb (83.9 kg)   Body mass index is 28.98 kg/m.  Physical Exam Constitutional:      Comments: He is in his usual good spirits.  HENT:     Mouth/Throat:     Comments: He still has some sutures in his upper gums.  He now has a full upper denture plate. Cardiovascular:     Rate and Rhythm: Normal rate and regular rhythm.     Heart  sounds: Murmur heard.      Comments: 2/6 systolic murmur Pulmonary:     Effort: Pulmonary effort is normal.     Breath sounds: Normal breath sounds.     Lab Results    Problem List Items Addressed This Visit      High   Aortic valve endocarditis    He has recovered from streptococcal endocarditis and aortic  valve replacement.  He has completed his dental work for now.  He does not need any further antibiotic therapy at this time but should receive amoxicillin 2 g already 60 minutes before any future dental work.  He can follow-up here as needed.          Cliffton Asters, MD [facility]ious [facility] [phone] pager   [phone] cell 08/28/2020, 8:48 AM

## 2020-08-28 NOTE — Assessment & Plan Note (Signed)
He has recovered from streptococcal endocarditis and aortic valve replacement.  He has completed his dental work for now.  He does not need any further antibiotic therapy at this time but should receive amoxicillin 2 g already 60 minutes before any future dental work.  He can follow-up here as needed.

## 2020-08-29 NOTE — Telephone Encounter (Signed)
Sent to the pharmacy by e-scribe. 

## 2020-09-01 ENCOUNTER — Telehealth: Payer: Self-pay

## 2020-09-01 ENCOUNTER — Ambulatory Visit: Payer: Medicare Other | Admitting: Thoracic Surgery (Cardiothoracic Vascular Surgery)

## 2020-09-01 NOTE — Telephone Encounter (Signed)
Oral Oncology Patient Advocate Encounter   Was successful in securing patient an [phone] grant from Patient [facility]) to provide copayment coverage for Zytiga.  This will keep the out of pocket expense at $0.     I have spoken with the patient.    The billing information is as follows and has been shared with [facility].   Member ID: [id number] Group ID: [id number] RxBin: [id number] Dates of Eligibility: 05/29/20 through 05/28/21  Fund:  [place] [facility] [place] Phone [phone] Fax [phone] 09/01/2020 10:59 AM

## 2020-09-01 NOTE — Telephone Encounter (Signed)
Oral Oncology Patient Advocate Encounter   Was successful in securing patient a $[id number] grant from [employer] to provide copayment coverage for Zytiga.  This will keep the out of pocket expense at $0.        The billing information is as follows and has been shared with [place].   Member ID: [id number] Group ID: [id number] RxBin: [id number] PCN: [id number] Dates of Eligibility: 08/27/20 through 08/27/21  Fund name:  [employer].  [place] Patient [place] Phone [phone] Fax [phone] 09/01/2020 11:01 AM

## 2020-09-04 ENCOUNTER — Other Ambulatory Visit: Payer: Self-pay | Admitting: *Deleted

## 2020-09-04 NOTE — Patient Outreach (Signed)
[facility]) Care Management  09/04/2020  Matthew Serrano. [birth year] [id number]  Telephone outreach to f/u on Aortic valve endocarditis, oral surgery, oncology. Unsuccessful, left message and requested a return call.  [name]. [name], MSN, [facility] Gerontological Nurse Practitioner [facility] Care Management [phone]

## 2020-09-05 ENCOUNTER — Telehealth: Payer: Medicare Other | Admitting: Thoracic Surgery (Cardiothoracic Vascular Surgery)

## 2020-09-08 ENCOUNTER — Other Ambulatory Visit: Payer: Self-pay

## 2020-09-08 ENCOUNTER — Telehealth (INDEPENDENT_AMBULATORY_CARE_PROVIDER_SITE_OTHER): Payer: Medicare Other | Admitting: Thoracic Surgery (Cardiothoracic Vascular Surgery)

## 2020-09-08 ENCOUNTER — Ambulatory Visit: Payer: Medicare Other | Admitting: Thoracic Surgery (Cardiothoracic Vascular Surgery)

## 2020-09-08 ENCOUNTER — Encounter: Payer: Self-pay | Admitting: Thoracic Surgery (Cardiothoracic Vascular Surgery)

## 2020-09-08 DIAGNOSIS — I350 Nonrheumatic aortic (valve) stenosis: Secondary | ICD-10-CM | POA: Diagnosis not present

## 2020-09-08 DIAGNOSIS — Z954 Presence of other heart-valve replacement: Secondary | ICD-10-CM

## 2020-09-08 NOTE — Progress Notes (Signed)
[address]       [place] [address]             [facility] VIRTUAL OFFICE NOTE  Referring Provider is [name], NP Primary Cardiologist is [name], MD PCP is [name], NP   HPI:  I spoke with Dantrell Schertzer. (DOB Jan 05, 1946 ) via telephone on 09/08/2020 at 1:03 PM and verified that I was speaking with the correct person using more than one form of identification.  We discussed the fact that I was contacting them from my office and they were located at home, as well as the reason(s) for conducting our visit virtually instead of in-person.  The patient expressed understanding the circumstances and agreed to proceed as described.   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 The patient's postoperative convalescence was remarkably uncomplicated and he was last seen here in our office on July 14, 2020.  Routine follow-up echocardiogram performed prior to that visit revealed normal left ventricular systolic function with normal functioning bioprosthetic tissue valve in the aortic position.  I spoke with the patient over the telephone today to check and see how he was doing.  He finally underwent dental extraction on August 22, 2020.  More recently he was seen in follow-up by Dr. Michel Bickers in the infectious disease clinic.  The patient elected not to participate in the outpatient cardiac rehab  program.  He states that he is doing very well.  He states he is back doing everything that he physically wants to do.  He reports no exertional shortness of breath or chest discomfort whatsoever.  He no longer has any significant pain in his chest.    Current Outpatient Medications  Medication Sig Dispense Refill  . abiraterone acetate (ZYTIGA) 250 MG tablet TAKE 4 TABLETS (1,000 MG TOTAL) BY MOUTH DAILY. TAKE ON AN EMPTY STOMACH 1 HOUR BEFORE OR 2 HOURS AFTER A MEAL 120 tablet 0  . aspirin EC 81 MG tablet Take 81 mg by mouth daily. Swallow whole.    Marland Kitchen atorvastatin (LIPITOR) 40 MG tablet TAKE 1 TABLET EVERY DAY 90 tablet 2  . cholecalciferol (VITAMIN D3) 25 MCG (1000 UT) tablet Take 1,000 Units by mouth daily.    Marland Kitchen doxazosin (CARDURA) 8 MG tablet TAKE 1 TABLET AT BEDTIME 90 tablet 3  . ferrous sulfate 325 (65 FE) MG EC tablet Take 1 tablet (325 mg total) by mouth 2 (two) times daily before a meal. 60 tablet 2  . glipiZIDE (GLUCOTROL XL) 10 MG 24 hr tablet TAKE 1 TABLET TWICE DAILY (Patient taking differently: Take 10 mg by mouth daily with breakfast. TAKE 1 TABLET TWICE DAILY) 180 tablet 0  . labetalol (NORMODYNE) 200 MG tablet TAKE 3 TABLETS EVERY MORNING  AND TAKE 2 TABLETS EVERY EVENING 450 tablet 3  . levothyroxine (SYNTHROID) 150 MCG tablet TAKE 1 TABLET EVERY DAY 90 tablet 0  . metFORMIN (GLUCOPHAGE) 1000 MG tablet Take 1 tablet (1,000 mg total) by mouth 2 (two) times daily with a meal. OFFICE VISIT NEEDED FOR ADDITIONAL  REFILLS 180 tablet 0  . omeprazole (PRILOSEC) 40 MG capsule TAKE 1 CAPSULE BY MOUTH EVERY DAY 90 capsule 1  . potassium chloride SA (KLOR-CON) 20 MEQ tablet Take 2 tablets (40 mEq total) by mouth 2 (two) times daily. 360 tablet 3  . predniSONE (DELTASONE) 5 MG tablet TAKE 1 TABLET (5 MG TOTAL) BY MOUTH DAILY WITH BREAKFAST. 90 tablet 3  . torsemide (DEMADEX) 20 MG tablet Take 1 tablet (20 mg total) by mouth 2 (two) times daily. 180 tablet 3   No current  facility-administered medications for this visit.     Diagnostic Tests:  ECHOCARDIOGRAM REPORT       Patient Name:  Matthew Serrano. Date of Exam: 06/24/2020  Medical Rec #: [id number]     Height:    67.0 in  Accession #:  [id number]     Weight:    215.0 lb  Date of Birth: Jan 04, 1946     BSA:     2.085 m  Patient Age:  11 years      BP:      154/96 mmHg  Patient Gender: M         HR:      74 bpm.  Exam Location: [facility]   Procedure: 2D Echo, Cardiac Doppler and Color Doppler   Indications:  I35.9 Aortic valve disorder    History:    Patient has no prior history of Echocardiogram  examinations and         Patient has prior history of Echocardiogram examinations,  most         recent 05/13/2020. Nonobstructive CAD; Risk  Factors:Hypertension         and Diabetes. Aortic root replacement with repair of aorta  to         pulmonary artery fistula for Streptococcus anginosis  bacterial         endocarditis complicated by aortic root abscess, aortic to         pulmonary artery fistula, and severe aortic insufficiency  on         May 15, 2020.         Aortic Valve: valve is present in the aortic position.  Procedure         Date: 05/13/2020.    Sonographer:  Marygrace Drought RCS  Referring Phys: [place]    1. Left ventricular ejection fraction, by estimation, is 55 to 60%. The  left ventricle has normal function. The left ventricle has no regional  wall motion abnormalities. There is mild concentric left ventricular  hypertrophy. Left ventricular diastolic  parameters are consistent with Grade II diastolic dysfunction  (pseudonormalization). Elevated left ventricular end-diastolic pressure.  2. Right ventricular systolic function is normal. The right ventricular  size is normal. There is  normal pulmonary artery systolic pressure.  3. Left atrial size was mildly dilated.  4. The mitral valve is normal in structure. Trivial mitral valve  regurgitation. No evidence of mitral stenosis.  5. Repaired aorta/aortic valve without stenosis or regurgitation. The  aortic valve has been repaired/replaced. Aortic valve regurgitation is not  visualized. No aortic stenosis is present. There is a valve present in the  aortic position. Procedure Date:  05/13/2020. Aortic valve area, by VTI measures 2.05 cm. Aortic valve mean  gradient measures 5.0 mmHg. Aortic valve Vmax measures 1.59 m/s.  6. The inferior vena cava is normal in size with greater than 50%  respiratory variability, suggesting right atrial pressure of  3 mmHg.   FINDINGS  Left Ventricle: Left ventricular ejection fraction, by estimation, is 55  to 60%. The left ventricle has normal function. The left ventricle has no  regional wall motion abnormalities. The left ventricular internal cavity  size was normal in size. There is  mild concentric left ventricular hypertrophy. Left ventricular diastolic  parameters are consistent with Grade II diastolic dysfunction  (pseudonormalization). Elevated left ventricular end-diastolic pressure.   Right Ventricle: The right ventricular size is normal. No increase in  right ventricular wall thickness. Right ventricular systolic function is  normal. There is normal pulmonary artery systolic pressure. The tricuspid  regurgitant velocity is 2.65 m/s, and  with an assumed right atrial pressure of 3 mmHg, the estimated right  ventricular systolic pressure is 31.1 mmHg.   Left Atrium: Left atrial size was mildly dilated.   Right Atrium: Right atrial size was normal in size.   Pericardium: There is no evidence of pericardial effusion.   Mitral Valve: The mitral valve is normal in structure. Trivial mitral  valve regurgitation. No evidence of mitral valve stenosis.   Tricuspid  Valve: The tricuspid valve is normal in structure. Tricuspid  valve regurgitation is mild . No evidence of tricuspid stenosis.   Aortic Valve: Repaired aorta/aortic valve without stenosis or  regurgitation. The aortic valve has been repaired/replaced. Aortic valve  regurgitation is not visualized. No aortic stenosis is present. Aortic  valve mean gradient measures 5.0 mmHg. Aortic  valve peak gradient measures 10.1 mmHg. Aortic valve area, by VTI measures  2.05 cm. There is a valve present in the aortic position. Procedure Date:  05/13/2020.   Pulmonic Valve: The pulmonic valve was normal in structure. Pulmonic valve  regurgitation is not visualized. No evidence of pulmonic stenosis.   Aorta: The aortic root is normal in size and structure.   Venous: The inferior vena cava is normal in size with greater than 50%  respiratory variability, suggesting right atrial pressure of 3 mmHg.   IAS/Shunts: No atrial level shunt detected by color flow Doppler.     LEFT VENTRICLE  PLAX 2D  LVIDd:     4.00 cm Diastology  LVIDs:     2.90 cm LV e' medial:  4.46 cm/s  LV PW:     1.10 cm LV E/e' medial: 18.3  LV IVS:    1.40 cm LV e' lateral:  14.90 cm/s  LVOT diam:   2.00 cm LV E/e' lateral: 5.5  LV SV:     58  LV SV Index:  28  LVOT Area:   3.14 cm     RIGHT VENTRICLE  RV Basal diam: 4.10 cm  RV S prime:   9.03 cm/s  TAPSE (M-mode): 1.3 cm  RVSP:      31.1 mmHg   LEFT ATRIUM       Index    RIGHT ATRIUM      Index  LA diam:    3.90 cm 1.87 cm/m RA Pressure: 3.00 mmHg  LA Vol (A2C):  71.8 ml 34.43 ml/m RA Area:   21.40 cm  LA Vol (A4C):  59.6 ml 28.58 ml/m RA Volume:  58.20 ml 27.91 ml/m  LA Biplane Vol: 67.2 ml 32.22 ml/m  AORTIC VALVE  AV Area (Vmax):  1.72 cm  AV Area (Vmean):  1.90 cm  AV Area (VTI):   2.05 cm  AV Vmax:      159.00 cm/s  AV Vmean:     107.000 cm/s  AV VTI:  0.282 m  AV Peak Grad:   10.1 mmHg  AV Mean Grad:   5.0 mmHg  LVOT Vmax:     86.80 cm/s  LVOT Vmean:    64.700 cm/s  LVOT VTI:     0.184 m  LVOT/AV VTI ratio: 0.65    AORTA  Ao Root diam: 3.90 cm  Ao Asc diam: 3.50 cm   MITRAL VALVE        TRICUSPID VALVE  MV Area (PHT):       TR Peak grad:  28.1 mmHg  MV Decel Time:       TR Vmax:    265.00 cm/s  MV E velocity: 81.80 cm/s Estimated RAP: 3.00 mmHg  MV A velocity: 68.10 cm/s RVSP:      31.1 mmHg  MV E/A ratio: 1.20               SHUNTS               Systemic VTI: 0.18 m               Systemic Diam: 2.00 cm   Skeet Latch MD  Electronically signed by Skeet Latch MD  Signature Date/Time: 06/24/2020/2:26:26 PM      Impression:  Patient is doing very well nearly 4 months status post urgent aortic root replacement using human allograft for treatment of Streptococcus angina gnosis bacterial endocarditis 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   Plan:  We have not recommended any change the patient's current medications.  The patient has been encouraged to continue to increase his physical activity without any particular limitations.  He will continue to follow-up intermittently with Dr. Burt Knack and Dr. [name] and return to our office in the future only should specific problems or questions arise.  All questions answered.    I discussed limitations of evaluation and management via telephone.  The patient was advised to call back for repeat telephone consultation or to seek an in-person evaluation if questions arise or the patient's clinical condition changes in any significant manner.  I spent in excess of 10 minutes of non-face-to-face time during the conduct of this telephone virtual office consultation, including pre-visit review of the patient's records and  direct conversation with the patient.     Valentina Gu. Roxy Manns, MD 09/08/2020 1:03 PM

## 2020-09-09 ENCOUNTER — Other Ambulatory Visit: Payer: Self-pay | Admitting: *Deleted

## 2020-09-09 NOTE — Patient Outreach (Signed)
[facility]) Care Management  09/09/2020  Matthew Serrano. February 23, 1946 [id number]  Telephone outreach for follow up DM, HF, wt mgmt.  Matthew Serrano says he is doing very well. He is following his HF Action plan and has been working on his daily glucose checks.   Goals    . Monitor and Manage My Blood Sugar     Follow Up Date 10/12/20   - check blood sugar at prescribed times - check blood sugar before and after exercise - check blood sugar if I feel it is too high or too low - enter blood sugar readings and medication or insulin into daily log - take the blood sugar log to all doctor visits - take the blood sugar meter to all doctor visits    Why is this important?   Checking your blood sugar at home helps to keep it from getting very high or very low.  Writing the results in a diary or log helps the doctor know how to care for you.  Your blood sugar log should have the time, date and the results.  Also, write down the amount of insulin or other medicine that you take.  Other information, like what you ate, exercise done and how you were feeling, will also be helpful.     Notes: Glucose levels almost always <120 fasting. Has [facility] disease mgmt calendar to record gluocse levels and wts. Has new educational tool, All About Carbs to review and start using to improve his dietary intake and reduce carbs. 08/05/20 Matthew Serrano' FBS levels are ranging from 76-109. His HgbA1C was 5.6 this month! He denies any hypoglycemic sxs. He does take his record to his MD. 09/09/20 FBS today was 113, no hypoglycemic episodes.    [name] Eye Exam     Follow Up Date 10/12/20  - keep appointment with eye doctor - schedule appointment with eye doctor    Why is this important?   Eye check-ups are important when you have diabetes.  Vision loss can be prevented.    Notes: Assess if has had his eye exam in last year. 08/05/20 Pt has not had an diabetic eye exam. Discussed and pt agree to call his  MD and schedule an appt before we talk again in one month. 09/09/20 Has not made eye appt yet, although he did go to the dentist. He had his teeth pulled and received dentures. Encouraged to make this appt before we talk again.    . Record weight daily     Follow up: 10/12/20  Pt to weigh daily and record.  Notes: 08/05/20. Pt is weighing daily. Wt varies between 182-185. Encouraged continuation and to log wts into his [facility] Calendar. Review HF ACTION PLAN. 09/09/20 Pt wt is stable at 185. No SOB or edema.    . Weight (lb) < 175 lb (79.4 kg)     Follow up 10/12/20 Will learn carb counting to help lose wt and improve DM management. 08/05/20 Pt has received the All About Carbs information NP sent. He has not really studied it to date. He plans on reviewing this with them. His priority task with this packet of information is to complete his HCPOA and Living Will. Encouraged to pull the carb counting info out of packet and start looking at it to learn what foods are considered carbs for our next conversation in January. 09/09/20 Wt is stable at 185. He has the educational material I sent but has not really  looked at it. Encouraged him to study a few pages a day and add to this education with a few pages each day. He can manage both his wt and DM with following these simple guidelines.         We have agreed to talk in one month, however, he is on my transitional nurse list. I have notified him he will be transitioned to a new nurse but I do not have the details yet. I will call him when I do or call him on our scheduled appt day.  [name]. [name], MSN, [facility] Gerontological Nurse Practitioner [facility] Care Management [phone]

## 2020-09-17 ENCOUNTER — Other Ambulatory Visit: Payer: Self-pay | Admitting: Oncology

## 2020-09-17 DIAGNOSIS — C61 Malignant neoplasm of prostate: Secondary | ICD-10-CM

## 2020-09-23 MED FILL — predniSONE 5 MG TABS: 5 | 90 days supply | Qty: 90 | Fill #3

## 2020-09-23 MED FILL — ABIRATERONE ACETATE 250 MG: 250 | 30 days supply | Qty: 120 | Fill #0

## 2020-10-07 ENCOUNTER — Encounter: Payer: Self-pay | Admitting: *Deleted

## 2020-10-07 NOTE — Patient Outreach (Signed)
[facility]) Care Management  10/07/2020  Matthew Serrano. 09-27-1945 [id number]  Case transitioned to embedded office care management.  [name]. [name], MSN, [facility] Gerontological Nurse Practitioner [facility] Care Management [phone]

## 2020-10-09 ENCOUNTER — Other Ambulatory Visit: Payer: Self-pay | Admitting: *Deleted

## 2020-10-09 NOTE — Patient Outreach (Signed)
[facility]) Care Management  10/09/2020  Matthew Serrano 05-20-1946 [id number]  Telephone outreach to advise pt of transition to our embedded care management team. New RN is Peter Garter.  Call was Unsuccessful, left message and requested a return call.   [name]. [name], MSN, [facility] Gerontological Nurse Practitioner [facility] Care Management [phone]

## 2020-10-10 ENCOUNTER — Telehealth: Payer: Self-pay

## 2020-10-10 ENCOUNTER — Other Ambulatory Visit: Payer: Self-pay | Admitting: *Deleted

## 2020-10-10 NOTE — Telephone Encounter (Signed)
  Chronic Care Management   Outreach Note  10/10/2020 Name: Matthew Serrano. MRN: [id number] DOB: 09/08/1945  Referred by: [name], NP Reason for referral : Chronic Care Management (RNCM: Initial Outreach Chronic Care Management and coordination needs Transition to Chronic Care Management)   An unsuccessful telephone outreach was attempted today. The patient was referred to the case management team for assistance with care management and care coordination.     Follow Up Plan: A HIPAA compliant phone message was left for the patient providing contact information and requesting a return call.  The care management team will reach out to the patient again over the next 7-10 days.   Peter Garter RN, Jackquline Denmark, CDE Care Management Coordinator [facility] [phone], Mobile [phone]

## 2020-10-16 ENCOUNTER — Other Ambulatory Visit: Payer: Self-pay | Admitting: Adult Health

## 2020-10-16 DIAGNOSIS — E119 Type 2 diabetes mellitus without complications: Secondary | ICD-10-CM

## 2020-10-16 DIAGNOSIS — I10 Essential (primary) hypertension: Secondary | ICD-10-CM

## 2020-10-16 DIAGNOSIS — E782 Mixed hyperlipidemia: Secondary | ICD-10-CM

## 2020-10-17 ENCOUNTER — Telehealth: Payer: Self-pay

## 2020-10-17 NOTE — Telephone Encounter (Cosign Needed)
  Chronic Care Management   Outreach Note  10/17/2020 Name: Matthew Serrano. MRN: [id number] DOB: 11/05/1945  Referred by: [name], NP Reason for referral : Chronic Care Management (RNCM: Initial Outreach Chronic Care Management and coordination needs-declines)   Successful contact was made with the patient to discuss care management and care coordination services. Patient declines engagement at this time.   Follow Up Plan: The patient has been provided with contact information for the care management team and has been advised to call with any health related questions or concerns.   Peter Garter RN, Jackquline Denmark, CDE Care Management Coordinator  [facility] [phone], Mobile [phone]

## 2020-10-20 ENCOUNTER — Other Ambulatory Visit: Payer: Self-pay | Admitting: Oncology

## 2020-10-20 DIAGNOSIS — C61 Malignant neoplasm of prostate: Secondary | ICD-10-CM

## 2020-10-20 MED FILL — ABIRATERONE ACETATE 250 MG: 250 | 30 days supply | Qty: 120 | Fill #0

## 2020-10-22 ENCOUNTER — Telehealth: Payer: Self-pay | Admitting: Adult Health

## 2020-10-22 NOTE — Progress Notes (Signed)
  Chronic Care Management   Outreach Note  10/22/2020 Name: Matthew Serrano. MRN: [id number] DOB: 05-15-1946  Referred by: [name], NP Reason for referral : No chief complaint on file.   An unsuccessful telephone outreach was attempted today. The patient was referred to the pharmacist for assistance with care management and care coordination.   Follow Up Plan:   Carley Perdue [employer] Scheduler

## 2020-11-11 NOTE — Progress Notes (Signed)
Subjective:   Matthew Serrano. is a 75 y.o. male who presents for an Initial Medicare Annual Wellness Visit.  I connected with Helene Kelp  today by telephone and verified that I am speaking with the correct person using two identifiers. Location patient: home Location provider: work Persons participating in the virtual visit: patient, provider.   I discussed the limitations, risks, security and privacy concerns of performing an evaluation and management service by telephone and the availability of in person appointments. I also discussed with the patient that there may be a patient responsible charge related to this service. The patient expressed understanding and verbally consented to this telephonic visit.    Interactive audio and video telecommunications were attempted between this provider and patient, however failed, due to patient having technical difficulties OR patient did not have access to video capability.  We continued and completed visit with audio only.      Review of Systems    N/A  Cardiac Risk Factors include: advanced age (>57men, >53 women);male gender;dyslipidemia;hypertension;diabetes mellitus     Objective:    Today's Vitals   There is no height or weight on file to calculate BMI.  Advanced Directives 11/12/2020 05/13/2020 05/13/2020 05/10/2020 05/02/2020 02/20/2020 02/13/2020  Does Patient Have a Medical Advance Directive? Yes No No No No No No  Type of Paramedic of [place];Living will - - - - - -  Does patient want to make changes to medical advance directive? No - Patient declined - - - - - -  Copy of [place] in Chart? No - copy requested - - - - - -  Would patient like information on creating a medical advance directive? - No - Patient declined - - No - Patient declined - Yes (MAU/Ambulatory/Procedural Areas - Information given)    Current Medications (verified) Outpatient Encounter Medications as of  11/12/2020  Medication Sig  . abiraterone acetate (ZYTIGA) 250 MG tablet TAKE 4 TABLETS (1,000 MG TOTAL) BY MOUTH DAILY. TAKE ON AN EMPTY STOMACH 1 HOUR BEFORE OR 2 HOURS AFTER A MEAL  . aspirin EC 81 MG tablet Take 81 mg by mouth daily. Swallow whole.  Marland Kitchen atorvastatin (LIPITOR) 40 MG tablet TAKE 1 TABLET EVERY DAY  . cholecalciferol (VITAMIN D3) 25 MCG (1000 UT) tablet Take 1,000 Units by mouth daily.  Marland Kitchen doxazosin (CARDURA) 8 MG tablet TAKE 1 TABLET AT BEDTIME  . ferrous sulfate 325 (65 FE) MG EC tablet Take 1 tablet (325 mg total) by mouth 2 (two) times daily before a meal.  . glipiZIDE (GLUCOTROL XL) 10 MG 24 hr tablet TAKE 1 TABLET TWICE DAILY (Patient taking differently: Take 10 mg by mouth daily with breakfast. TAKE 1 TABLET TWICE DAILY)  . labetalol (NORMODYNE) 200 MG tablet TAKE 3 TABLETS EVERY MORNING  AND TAKE 2 TABLETS EVERY EVENING  . levothyroxine (SYNTHROID) 150 MCG tablet TAKE 1 TABLET EVERY DAY  . metFORMIN (GLUCOPHAGE) 1000 MG tablet Take 1 tablet (1,000 mg total) by mouth 2 (two) times daily with a meal. OFFICE VISIT NEEDED FOR ADDITIONAL REFILLS  . omeprazole (PRILOSEC) 40 MG capsule TAKE 1 CAPSULE BY MOUTH EVERY DAY  . potassium chloride SA (KLOR-CON) 20 MEQ tablet Take 2 tablets (40 mEq total) by mouth 2 (two) times daily.  . predniSONE (DELTASONE) 5 MG tablet TAKE 1 TABLET (5 MG TOTAL) BY MOUTH DAILY WITH BREAKFAST.  Marland Kitchen torsemide (DEMADEX) 20 MG tablet Take 1 tablet (20 mg total) by mouth 2 (two)  times daily.   No facility-administered encounter medications on file as of 11/12/2020.    Allergies (verified) Patient has no known allergies.   History: Past Medical History:  Diagnosis Date  . Aortic valve endocarditis 05/13/2020   STREPTOCOCCUS ANGINOSIS  . AVM (arteriovenous malformation) of colon   . Cancer [facility])    prostate cancer  . DIABETES MELLITUS, TYPE II 07/06/2007  . ED (erectile dysfunction)   . Elevated PSA 01/20/2012  . 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 History of nuclear stress test    Myoview 8/18: EF 64, inferobasal thinning, no ischemia, low risk  . HYPERLIPIDEMIA 07/06/2007  . HYPERTENSION 07/06/2007  . HYPOTHYROIDISM 07/06/2007  . Leg pain    ABIs 8/18:  Normal   . Obesity   . 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 Severe aortic insufficiency 05/13/2020   acute onset in setting of bacterial endocarditis  . Severe aortic stenosis    Past Surgical History:  Procedure Laterality Date  . AORTIC VALVE REPLACEMENT N/A 05/15/2020   Procedure: AORTIC VALVE REPLACEMENT (AVR) SIZE 23 MM,  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;  Surgeon: Rexene Alberts, MD;  Location: [place];  Service: Open Heart Surgery;  Laterality: N/A;  . ASCENDING AORTIC ROOT REPLACEMENT N/A 05/15/2020   Procedure: HUMAN ALLOGRAFT AORTIC ROOT REPLACEMENT;  Surgeon: Rexene Alberts, MD;  Location: [place];  Service: Open Heart Surgery;  Laterality: N/A;  . COLONOSCOPY    . COLONOSCOPY N/A 05/03/2020   Procedure: COLONOSCOPY;  Surgeon: Juanita Craver, MD;  Location: [facility];  Service: Endoscopy;  Laterality: N/A;  . COLONOSCOPY W/ BIOPSIES AND POLYPECTOMY    . ENTEROSCOPY N/A 05/02/2020   Procedure: ENTEROSCOPY;  Surgeon: Rush Landmark Telford Nab., MD;  Location: [facility];  Service: Gastroenterology;  Laterality: N/A;  . HEMOSTASIS CLIP PLACEMENT  05/03/2020   Procedure: HEMOSTASIS CLIP PLACEMENT;  Surgeon: Juanita Craver, MD;  Location: [facility];  Service: Endoscopy;;  . HOT HEMOSTASIS N/A 05/03/2020   Procedure: HOT HEMOSTASIS (ARGON PLASMA COAGULATION/BICAP);  Surgeon: Juanita Craver, MD;  Location: [facility];  Service: Endoscopy;  Laterality: N/A;  . IR IMAGING GUIDED PORT INSERTION  07/05/2018  . POLYPECTOMY    . PORT-A-CATH REMOVAL Right 05/20/2020   Procedure:  REMOVAL PORT-A-CATH;  Surgeon: Rexene Alberts, MD;  Location: [place];  Service: Thoracic;  Laterality: Right;  . RIGHT/LEFT HEART CATH AND CORONARY ANGIOGRAPHY N/A 02/13/2020   Procedure: RIGHT/LEFT HEART CATH AND CORONARY ANGIOGRAPHY;  Surgeon: Belva Crome, MD;  Location: [place] [facility];  Service: Cardiovascular;  Laterality: N/A;  . SUBMUCOSAL INJECTION  05/02/2020   Procedure: SUBMUCOSAL INJECTION;  Surgeon: Irving Copas., MD;  Location: [facility];  Service: Gastroenterology;;  . TEE WITHOUT CARDIOVERSION N/A 05/14/2020   Procedure: TRANSESOPHAGEAL ECHOCARDIOGRAM (TEE);  Surgeon: Sanda Klein, MD;  Location: [place];  Service: Cardiovascular;  Laterality: N/A;  . TEE WITHOUT CARDIOVERSION N/A 05/15/2020   Procedure: TRANSESOPHAGEAL ECHOCARDIOGRAM (TEE);  Surgeon: Rexene Alberts, MD;  Location: [place];  Service: Open Heart Surgery;  Laterality: N/A;   Family History  Problem Relation Age of Onset  . Hypertension Other   . Breast cancer Sister   . Breast cancer Other        family history  . Breast cancer Brother  prostate ca  . Aneurysm Mother        Brain aneurysm   . Prostate cancer Father   . Colon cancer Neg Hx   . Esophageal cancer Neg Hx   . Stomach cancer Neg Hx   . Rectal cancer Neg Hx    Social History   Socioeconomic History  . Marital status: Widowed    Spouse name: Not on file  . Number of children: Not on file  . Years of education: Not on file  . Highest education level: Not on file  Occupational History  . Not on file  Tobacco Use  . Smoking status: Former Research scientist (life sciences)  . Smokeless tobacco: Never Used  . Tobacco comment: quit 2005  Vaping Use  . Vaping Use: Never used  Substance and Sexual Activity  . Alcohol use: Not Currently    Alcohol/week: 21.0 standard drinks    Types: 21 Cans of beer per week  . Drug use: No  . Sexual activity: Not on file  Other Topics Concern  . Not on file  Social History Narrative   He works as a  Freight forwarder    Not married    No kids          Social Determinants of Radio broadcast assistant Strain: Low Risk   . Difficulty of Paying Living Expenses: Not hard at all  Food Insecurity: No Food Insecurity  . Worried About Charity fundraiser in the Last Year: Never true  . Ran Out of Food in the Last Year: Never true  Transportation Needs: No Transportation Needs  . Lack of Transportation (Medical): No  . Lack of Transportation (Non-Medical): No  Physical Activity: Inactive  . Days of Exercise per Week: 0 days  . Minutes of Exercise per Session: 0 min  Stress: No Stress Concern Present  . Feeling of Stress : Not at all  Social Connections: Socially Isolated  . Frequency of Communication with Friends and Family: More than three times a week  . Frequency of Social Gatherings with Friends and Family: More than three times a week  . Attends Religious Services: Never  . Active Member of Clubs or Organizations: No  . Attends Archivist Meetings: Never  . Marital Status: Widowed    Tobacco Counseling Counseling given: Not Answered Comment: quit 2005   Clinical Intake:  Pre-visit preparation completed: Yes  Pain : No/denies pain     Nutritional Risks: None Diabetes: Yes CBG done?: No Did pt. bring in CBG monitor from home?: No  How often do you need to have someone help you when you read instructions, pamphlets, or other written materials from your doctor or pharmacy?: 1 - Never What is the last grade level you completed in school?: GED  Diabetic?Yes  Nutrition Risk Assessment:  Has the patient had any N/V/D within the last 2 months?  No  Does the patient have any non-healing wounds?  No  Has the patient had any unintentional weight loss or weight gain?  No   Diabetes:  Is the patient diabetic?  Yes  If diabetic, was a CBG obtained today?  No  Did the patient bring in their glucometer from home?  No  How often do you monitor your CBG's?  States checks glucose every other day.   Financial Strains and Diabetes Management:  Are you having any financial strains with the device, your supplies or your medication? No .  Does the patient want to be seen by  Chronic Care Management for management of their diabetes?  No  Would the patient like to be referred to a Nutritionist or for Diabetic Management?  No   Diabetic Exams:  Diabetic Eye Exam: Overdue for diabetic eye exam. Pt has been advised about the importance in completing this exam. Patient advised to call and schedule an eye exam. Diabetic Foot Exam: Overdue, Pt has been advised about the importance in completing this exam. Pt is scheduled for diabetic foot exam on on next office visit .  Interpreter Needed?: No  Information entered by :: [place] of Daily Living In your present state of health, do you have any difficulty performing the following activities: 11/12/2020 07/02/2020  Hearing? N N  Vision? N N  Difficulty concentrating or making decisions? N N  Walking or climbing stairs? N -  Comment - -  Dressing or bathing? N N  Doing errands, shopping? N Y  Comment - Recovering from valve replacement.  Preparing Food and eating ? N N  Using the Toilet? N N  In the past six months, have you accidently leaked urine? N N  Do you have problems with loss of bowel control? N N  Managing your Medications? N N  Managing your Finances? N N  Housekeeping or managing your Housekeeping? N Y  Comment - In recovery from surgery  Some recent data might be hidden    Patient Care Team: [name], NP as PCP - General (Family Medicine) [name], MD as PCP - Cardiology (Cardiology) [name], MD as Attending Physician (Urology)  Indicate any recent Medical Services you may have received from other than [facility] providers in the past year (date may be approximate).     Assessment:   This is a routine wellness examination for  [place].  Hearing/Vision screen  Hearing Screening   125Hz  250Hz  500Hz  1000Hz  2000Hz  3000Hz  4000Hz  6000Hz  8000Hz   Right ear:           Left ear:           Vision Screening Comments: Patient has not had an eye exam in 2 years.  Dietary issues and exercise activities discussed: Current Exercise Habits: The patient does not participate in regular exercise at present, Exercise limited by: None identified  Goals    . Monitor and Manage My Blood Sugar     Follow Up Date 10/12/20   - check blood sugar at prescribed times - check blood sugar before and after exercise - check blood sugar if I feel it is too high or too low - enter blood sugar readings and medication or insulin into daily log - take the blood sugar log to all doctor visits - take the blood sugar meter to all doctor visits    Why is this important?   Checking your blood sugar at home helps to keep it from getting very high or very low.  Writing the results in a diary or log helps the doctor know how to care for you.  Your blood sugar log should have the time, date and the results.  Also, write down the amount of insulin or other medicine that you take.  Other information, like what you ate, exercise done and how you were feeling, will also be helpful.     Notes: Glucose levels almost always <120 fasting. Has [facility] disease mgmt calendar to record gluocse levels and wts. Has new educational tool, All About Carbs to review and start using to improve his dietary  intake and reduce carbs. 08/05/20 Mr. Hosang' FBS levels are ranging from 76-109. His HgbA1C was 5.6 this month! He denies any hypoglycemic sxs. He does take his record to his MD. 09/09/20 FBS today was 113, no hypoglycemic episodes.    [name] Eye Exam     Follow Up Date 10/12/20  - keep appointment with eye doctor - schedule appointment with eye doctor    Why is this important?   Eye check-ups are important when you have diabetes.  Vision loss can be prevented.     Notes: Assess if has had his eye exam in last year. 08/05/20 Pt has not had an diabetic eye exam. Discussed and pt agree to call his MD and schedule an appt before we talk again in one month. 09/09/20 Has not made eye appt yet, although he did go to the dentist. He had his teeth pulled and received dentures. Encouraged to make this appt before we talk again.    . Record weight daily     Follow up: 10/12/20  Pt to weigh daily and record.  Notes: 08/05/20. Pt is weighing daily. Wt varies between 182-185. Encouraged continuation and to log wts into his [facility] Calendar. Review HF ACTION PLAN. 09/09/20 Pt wt is stable at 185. No SOB or edema.    . Weight (lb) < 175 lb (79.4 kg)     Follow up 10/12/20 Will learn carb counting to help lose wt and improve DM management. 08/05/20 Pt has received the All About Carbs information NP sent. He has not really studied it to date. He plans on reviewing this with them. His priority task with this packet of information is to complete his HCPOA and Living Will. Encouraged to pull the carb counting info out of packet and start looking at it to learn what foods are considered carbs for our next conversation in January. 09/09/20 Wt is stable at 185. He has the educational material I sent but has not really looked at it. Encouraged him to study a few pages a day and add to this education with a few pages each day. He can manage both his wt and DM with following these simple guidelines.       Depression Screen PHQ 2/9 Scores 11/12/2020 08/07/2020 07/29/2020 07/24/2020 07/02/2020 06/26/2020 05/24/2018  PHQ - 2 Score 0 1 0 0 0 0 0    Fall Risk Fall Risk  11/12/2020 08/07/2020 07/29/2020 07/24/2020 07/02/2020  Falls in the past year? 1 0 0 0 0  Number falls in past yr: 0 - 0 - 0  Injury with Fall? 0 - - - 0  Risk for fall due to : No Fall Risks - - No Fall Risks Medication side effect  Follow up Falls evaluation completed;Falls prevention discussed Falls evaluation completed - Falls  evaluation completed Falls evaluation completed    FALL RISK PREVENTION PERTAINING TO THE HOME:  Any stairs in or around the home? Yes  If so, are there any without handrails? No  Home free of loose throw rugs in walkways, pet beds, electrical cords, etc? Yes  Adequate lighting in your home to reduce risk of falls? Yes   ASSISTIVE DEVICES UTILIZED TO PREVENT FALLS:  Life alert? No  Use of a cane, walker or w/c? No  Grab bars in the bathroom? Yes  Shower chair or bench in shower? No  Elevated toilet seat or a handicapped toilet? No    Cognitive Function:   Normal cognitive status assessed by direct observation  by this Nurse Health Advisor. No abnormalities found.        Immunizations Immunization History  Administered Date(s) Administered  . Fluad Quad(high Dose 65+) 07/26/2019, 07/29/2020  . Influenza Split 07/12/2011  . Influenza Whole 05/23/2008, 06/13/2009, 05/11/2010  . Influenza, High Dose Seasonal PF 05/03/2016, 05/24/2018  . Influenza,inj,Quad PF,6+ Mos 05/16/2014  . PFIZER(Purple Top)SARS-COV-2 Vaccination 10/21/2019, 11/20/2019, 07/25/2020  . Pneumococcal Conjugate-13 11/13/2013  . Pneumococcal Polysaccharide-23 06/02/2000, 12/24/2004, 05/03/2016  . Td 01/05/1996, 01/12/2007    TDAP status: Due, Education has been provided regarding the importance of this vaccine. Advised may receive this vaccine at local pharmacy or Health Dept. Aware to provide a copy of the vaccination record if obtained from local pharmacy or Health Dept. Verbalized acceptance and understanding.  Flu Vaccine status: Up to date  Pneumococcal vaccine status: Up to date  Covid-19 vaccine status: Completed vaccines  Qualifies for Shingles Vaccine? Yes   Zostavax completed No   Shingrix Completed?: No.    Education has been provided regarding the importance of this vaccine. Patient has been advised to call insurance company to determine out of pocket expense if they have not yet received this  vaccine. Advised may also receive vaccine at local pharmacy or Health Dept. Verbalized acceptance and understanding.  Screening Tests Health Maintenance  Topic Date Due  . OPHTHALMOLOGY EXAM  09/25/2014  . FOOT EXAM  05/18/2018  . COVID-19 Vaccine (4 - Booster for Pfizer series) 01/23/2021  . HEMOGLOBIN A1C  01/27/2021  . COLONOSCOPY (Pts 45-65yrs Insurance coverage will need to be confirmed)  05/04/2023  . INFLUENZA VACCINE  Completed  . Hepatitis C Screening  Completed  . PNA vac Low Risk Adult  Completed  . HPV VACCINES  Aged Out    Health Maintenance  Health Maintenance Due  Topic Date Due  . OPHTHALMOLOGY EXAM  09/25/2014  . FOOT EXAM  05/18/2018    Colorectal cancer screening: Type of screening: Colonoscopy. Completed 05/03/2020. Repeat every 3 years  Lung Cancer Screening: (Low Dose CT Chest recommended if Age 34-80 years, 30 pack-year currently smoking OR have quit w/in 15years.) does not qualify.   Lung Cancer Screening Referral: N/A   Additional Screening:  Hepatitis C Screening: does qualify; Completed 05/18/2017  Vision Screening: Recommended annual ophthalmology exams for early detection of glaucoma and other disorders of the eye. Is the patient up to date with their annual eye exam?  No  Who is the provider or what is the name of the office in which the patient attends annual eye exams? Unsure of previous eye doctor  If pt is not established with a provider, would they like to be referred to a provider to establish care? No .   Dental Screening: Recommended annual dental exams for proper oral hygiene  Community Resource Referral / Chronic Care Management: CRR required this visit?  No   CCM required this visit?  No      Plan:     I have personally reviewed and noted the following in the patient's chart:   . Medical and social history . Use of alcohol, tobacco or illicit drugs  . Current medications and supplements . Functional ability and  status . Nutritional status . Physical activity . Advanced directives . List of other physicians . Hospitalizations, surgeries, and ER visits in previous 12 months . Vitals . Screenings to include cognitive, depression, and falls . Referrals and appointments  In addition, I have reviewed and discussed with patient certain preventive protocols, quality metrics, and  best practice recommendations. A written personalized care plan for preventive services as well as general preventive health recommendations were provided to patient.     [name], LPN   1/75/1025   Nurse Notes: None

## 2020-11-12 ENCOUNTER — Other Ambulatory Visit: Payer: Self-pay | Admitting: Oncology

## 2020-11-12 ENCOUNTER — Ambulatory Visit (INDEPENDENT_AMBULATORY_CARE_PROVIDER_SITE_OTHER): Payer: Medicare Other

## 2020-11-12 DIAGNOSIS — Z0000 Encounter for general adult medical examination without abnormal findings: Secondary | ICD-10-CM

## 2020-11-12 DIAGNOSIS — C61 Malignant neoplasm of prostate: Secondary | ICD-10-CM

## 2020-11-12 NOTE — Patient Instructions (Signed)
Matthew Serrano , Thank you for taking time to come for your Medicare Wellness Visit. I appreciate your ongoing commitment to your health goals. Please review the following plan we discussed and let me know if I can assist you in the future.   Screening recommendations/referrals: Colonoscopy: Up to date, next due 05/04/2023 Recommended yearly ophthalmology/optometry visit for glaucoma screening and checkup Recommended yearly dental visit for hygiene and checkup  Vaccinations: Influenza vaccine: Up to date, next due fall 2022  Pneumococcal vaccine: Completed series Tdap vaccine: Currently due, you may await and injury to receive  Shingles vaccine: Currently due for shingrix, we recommend that you receive at your local pharmacy as it less expensive     Advanced directives: Copies on file  Conditions/risks identified: None   Next appointment: 11/13/2021 @ 9:00 am with Nurse Health Advisor   Preventive Care 74 Years and Older, Male Preventive care refers to lifestyle choices and visits with your health care provider that can promote health and wellness. What does preventive care include?  A yearly physical exam. This is also called an annual well check.  Dental exams once or twice a year.  Routine eye exams. Ask your health care provider how often you should have your eyes checked.  Personal lifestyle choices, including:  Daily care of your teeth and gums.  Regular physical activity.  Eating a healthy diet.  Avoiding tobacco and drug use.  Limiting alcohol use.  Practicing safe sex.  Taking low doses of aspirin every day.  Taking vitamin and mineral supplements as recommended by your health care provider. What happens during an annual well check? The services and screenings done by your health care provider during your annual well check will depend on your age, overall health, lifestyle risk factors, and family history of disease. Counseling  Your health care provider may ask  you questions about your:  Alcohol use.  Tobacco use.  Drug use.  Emotional well-being.  Home and relationship well-being.  Sexual activity.  Eating habits.  History of falls.  Memory and ability to understand (cognition).  Work and work Statistician. Screening  You may have the following tests or measurements:  Height, weight, and BMI.  Blood pressure.  Lipid and cholesterol levels. These may be checked every 5 years, or more frequently if you are over 85 years old.  Skin check.  Lung cancer screening. You may have this screening every year starting at age 43 if you have a 30-pack-year history of smoking and currently smoke or have quit within the past 15 years.  Fecal occult blood test (FOBT) of the stool. You may have this test every year starting at age 26.  Flexible sigmoidoscopy or colonoscopy. You may have a sigmoidoscopy every 5 years or a colonoscopy every 10 years starting at age 53.  Prostate cancer screening. Recommendations will vary depending on your family history and other risks.  Hepatitis C blood test.  Hepatitis B blood test.  Sexually transmitted disease (STD) testing.  Diabetes screening. This is done by checking your blood sugar (glucose) after you have not eaten for a while (fasting). You may have this done every 1-3 years.  Abdominal aortic aneurysm (AAA) screening. You may need this if you are a current or former smoker.  Osteoporosis. You may be screened starting at age 52 if you are at high risk. Talk with your health care provider about your test results, treatment options, and if necessary, the need for more tests. Vaccines  Your health care provider  may recommend certain vaccines, such as:  Influenza vaccine. This is recommended every year.  Tetanus, diphtheria, and acellular pertussis (Tdap, Td) vaccine. You may need a Td booster every 10 years.  Zoster vaccine. You may need this after age 79.  Pneumococcal 13-valent conjugate  (PCV13) vaccine. One dose is recommended after age 24.  Pneumococcal polysaccharide (PPSV23) vaccine. One dose is recommended after age 10. Talk to your health care provider about which screenings and vaccines you need and how often you need them. This information is not intended to replace advice given to you by your health care provider. Make sure you discuss any questions you have with your health care provider. Document Released: 09/05/2015 Document Revised: 04/28/2016 Document Reviewed: 06/10/2015 Elsevier Interactive Patient Education  2017 [employer] Prevention in the Home Falls can cause injuries. They can happen to people of all ages. There are many things you can do to make your home safe and to help prevent falls. What can I do on the outside of my home?  Regularly fix the edges of walkways and driveways and fix any cracks.  Remove anything that might make you trip as you walk through a door, such as a raised step or threshold.  Trim any bushes or trees on the path to your home.  Use bright outdoor lighting.  Clear any walking paths of anything that might make someone trip, such as rocks or tools.  Regularly check to see if handrails are loose or broken. Make sure that both sides of any steps have handrails.  Any raised decks and porches should have guardrails on the edges.  Have any leaves, snow, or ice cleared regularly.  Use sand or salt on walking paths during winter.  Clean up any spills in your garage right away. This includes oil or grease spills. What can I do in the bathroom?  Use night lights.  Install grab bars by the toilet and in the tub and shower. Do not use towel bars as grab bars.  Use non-skid mats or decals in the tub or shower.  If you need to sit down in the shower, use a plastic, non-slip stool.  Keep the floor dry. Clean up any water that spills on the floor as soon as it happens.  Remove soap buildup in the tub or shower  regularly.  Attach bath mats securely with double-sided non-slip rug tape.  Do not have throw rugs and other things on the floor that can make you trip. What can I do in the bedroom?  Use night lights.  Make sure that you have a light by your bed that is easy to reach.  Do not use any sheets or blankets that are too big for your bed. They should not hang down onto the floor.  Have a firm chair that has side arms. You can use this for support while you get dressed.  Do not have throw rugs and other things on the floor that can make you trip. What can I do in the kitchen?  Clean up any spills right away.  Avoid walking on wet floors.  Keep items that you use a lot in easy-to-reach places.  If you need to reach something above you, use a strong step stool that has a grab bar.  Keep electrical cords out of the way.  Do not use floor polish or wax that makes floors slippery. If you must use wax, use non-skid floor wax.  Do not have throw rugs  and other things on the floor that can make you trip. What can I do with my stairs?  Do not leave any items on the stairs.  Make sure that there are handrails on both sides of the stairs and use them. Fix handrails that are broken or loose. Make sure that handrails are as long as the stairways.  Check any carpeting to make sure that it is firmly attached to the stairs. Fix any carpet that is loose or worn.  Avoid having throw rugs at the top or bottom of the stairs. If you do have throw rugs, attach them to the floor with carpet tape.  Make sure that you have a light switch at the top of the stairs and the bottom of the stairs. If you do not have them, ask someone to add them for you. What else can I do to help prevent falls?  Wear shoes that:  Do not have high heels.  Have rubber bottoms.  Are comfortable and fit you well.  Are closed at the toe. Do not wear sandals.  If you use a stepladder:  Make sure that it is fully  opened. Do not climb a closed stepladder.  Make sure that both sides of the stepladder are locked into place.  Ask someone to hold it for you, if possible.  Clearly mark and make sure that you can see:  Any grab bars or handrails.  First and last steps.  Where the edge of each step is.  Use tools that help you move around (mobility aids) if they are needed. These include:  Canes.  Walkers.  Scooters.  Crutches.  Turn on the lights when you go into a dark area. Replace any light bulbs as soon as they burn out.  Set up your furniture so you have a clear path. Avoid moving your furniture around.  If any of your floors are uneven, fix them.  If there are any pets around you, be aware of where they are.  Review your medicines with your doctor. Some medicines can make you feel dizzy. This can increase your chance of falling. Ask your doctor what other things that you can do to help prevent falls. This information is not intended to replace advice given to you by your health care provider. Make sure you discuss any questions you have with your health care provider. Document Released: 06/05/2009 Document Revised: 01/15/2016 Document Reviewed: 09/13/2014 Elsevier Interactive Patient Education  2017 [employer].

## 2020-11-14 ENCOUNTER — Other Ambulatory Visit (HOSPITAL_COMMUNITY): Payer: Self-pay

## 2020-11-21 MED FILL — ABIRATERONE ACETATE 250 MG: 250 | 30 days supply | Qty: 120 | Fill #0

## 2020-12-17 ENCOUNTER — Other Ambulatory Visit: Payer: Self-pay | Admitting: Oncology

## 2020-12-17 ENCOUNTER — Other Ambulatory Visit (HOSPITAL_COMMUNITY): Payer: Self-pay

## 2020-12-17 DIAGNOSIS — C61 Malignant neoplasm of prostate: Secondary | ICD-10-CM

## 2020-12-17 MED ORDER — ABIRATERONE ACETATE 250 MG PO TABS
ORAL_TABLET | Freq: Every day | ORAL | 0 refills | Status: DC
  Filled 2020-12-17: qty 120, 30d supply, fill #0

## 2020-12-22 ENCOUNTER — Other Ambulatory Visit: Payer: Self-pay

## 2020-12-22 ENCOUNTER — Ambulatory Visit (INDEPENDENT_AMBULATORY_CARE_PROVIDER_SITE_OTHER): Payer: Medicare Other | Admitting: Cardiovascular Disease

## 2020-12-22 ENCOUNTER — Encounter: Payer: Self-pay | Admitting: Cardiovascular Disease

## 2020-12-22 ENCOUNTER — Telehealth: Payer: Self-pay | Admitting: Adult Health

## 2020-12-22 VITALS — BP 142/80 | HR 63 | Ht 68.0 in | Wt 190.4 lb

## 2020-12-22 DIAGNOSIS — Z954 Presence of other heart-valve replacement: Secondary | ICD-10-CM | POA: Diagnosis not present

## 2020-12-22 DIAGNOSIS — I5032 Chronic diastolic (congestive) heart failure: Secondary | ICD-10-CM

## 2020-12-22 DIAGNOSIS — I10 Essential (primary) hypertension: Secondary | ICD-10-CM | POA: Diagnosis not present

## 2020-12-22 LAB — BASIC METABOLIC PANEL
BUN/Creatinine Ratio: 19 (ref 10–24)
BUN: 19 mg/dL (ref 8–27)
CO2: 28 mmol/L (ref 20–29)
Calcium: 9 mg/dL (ref 8.6–10.2)
Chloride: 105 mmol/L (ref 96–106)
Creatinine, Ser: 1 mg/dL (ref 0.76–1.27)
Glucose: 104 mg/dL — ABNORMAL HIGH (ref 65–99)
Potassium: 3.4 mmol/L — ABNORMAL LOW (ref 3.5–5.2)
Sodium: 144 mmol/L (ref 134–144)
eGFR: 79 mL/min/{1.73_m2} (ref >59)

## 2020-12-22 LAB — CBC WITH DIFFERENTIAL/PLATELET
Basophils Absolute: 0 10*3/uL (ref 0.0–0.2)
Basos: 1 %
EOS (ABSOLUTE): 0.2 10*3/uL (ref 0.0–0.4)
Eos: 3 %
Hematocrit: 31.5 % — ABNORMAL LOW (ref 37.5–51.0)
Hemoglobin: 10.3 g/dL — ABNORMAL LOW (ref 13.0–17.7)
Lymphocytes Absolute: 2.3 10*3/uL (ref 0.7–3.1)
Lymphs: 34 %
MCH: 27.2 pg (ref 26.6–33.0)
MCHC: 32.7 g/dL (ref 31.5–35.7)
MCV: 83 fL (ref 79–97)
Monocytes Absolute: 0.6 10*3/uL (ref 0.1–0.9)
Monocytes: 9 %
Neutrophils Absolute: 3.6 10*3/uL (ref 1.4–7.0)
Neutrophils: 53 %
Platelets: 190 10*3/uL (ref 150–450)
RBC: 3.78 x10E6/uL — ABNORMAL LOW (ref 4.14–5.80)
RDW: 14 % (ref 11.6–15.4)
WBC: 6.6 10*3/uL (ref 3.4–10.8)

## 2020-12-22 MED ORDER — AMOXICILLIN 500 MG PO TABS: ORAL_TABLET | ORAL | 12 refills | Status: DC

## 2020-12-22 NOTE — Patient Instructions (Signed)
Medication Instructions:  Your provider discussed the importance of taking an antibiotic prior to all dental visits to prevent damage to the heart valves from infection. You were given a prescription for AMOXICILLIN 2,000 mg to take one hour prior to any dental appointment.  *If you need a refill on your cardiac medications before your next appointment, please call your pharmacy*  Lab Work: TODAY! BMET, CBC If you have labs (blood work) drawn today and your tests are completely normal, you will receive your results only by: Marland Kitchen MyChart Message (if you have MyChart) OR . A paper copy in the mail If you have any lab test that is abnormal or we need to change your treatment, we will call you to review the results.  Testing/Procedures: Your physician has requested that you have an echocardiogram. Echocardiography is a painless test that uses sound waves to create images of your heart. It provides your doctor with information about the size and shape of your heart and how well your heart's chambers and valves are working. This procedure takes approximately one hour. There are no restrictions for this procedure.  Follow-Up: At [facility], you and your health needs are our priority.  As part of our continuing mission to provide you with exceptional heart care, we have created designated Provider Care Teams.  These Care Teams include your primary Cardiologist (physician) and Advanced Practice Providers (APPs -  Physician Assistants and Nurse Practitioners) who all work together to provide you with the care you need, when you need it. Your next appointment:   6 month(s) The format for your next appointment:   In Person Provider:    Richardson Dopp, PA-C  Robbie Lis, PA-C

## 2020-12-22 NOTE — Progress Notes (Signed)
Cardiology Office Note:    Date:  12/22/2020   ID:  Matthew Mcardle., DOB 13-Apr-1946, MRN [id number]  PCP:  [name], NP   [facility]  Cardiologist:  [name], MD  Advanced Practice Provider:  No care team member to display Electrophysiologist:  None       Referring MD: [name], NP   No chief complaint on file.   History of Present Illness:    Matthew Rosengrant. is a 75 y.o. male with a hx of:  Aortic stenosis ? 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 Cath 6/21: mod non-obs CAD   Diabetes mellitus   Hypertension   Hyperlipidemia  Hypothyroidism   Erectile dysfunction  Metastatic prostate CA on suppression chemotherapy   Hx of LGI bleed 01/2020 ? Colo w cecal telangiectasias >> treated w APC ? S/p PRBC transfusions  The patient is doing well.  He is here alone today.  He has some residual discomfort around the right upper chest where his port was removed.  He otherwise feels very good and is not having any exertional chest pain or pressure.  He denies shortness of breath.  He denies orthopnea or PND.  He has some chronic swelling in the left leg which is unchanged over time.  No fevers or chills.  No other specific complaints.  Past Medical History:  Diagnosis Date  . Aortic valve endocarditis 05/13/2020   STREPTOCOCCUS ANGINOSIS  . AVM (arteriovenous malformation) of colon   . Cancer [facility])    prostate cancer  . DIABETES MELLITUS, TYPE II 07/06/2007  . ED (erectile dysfunction)   . Elevated PSA 01/20/2012  . 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 History of nuclear stress test    Myoview 8/18: EF 64, inferobasal thinning, no ischemia, low risk  . HYPERLIPIDEMIA  07/06/2007  . HYPERTENSION 07/06/2007  . HYPOTHYROIDISM 07/06/2007  . Leg pain    ABIs 8/18:  Normal   . Obesity   . 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 Severe aortic insufficiency 05/13/2020   acute onset in setting of bacterial endocarditis  . Severe aortic stenosis     Past Surgical History:  Procedure Laterality Date  . AORTIC VALVE REPLACEMENT N/A 05/15/2020   Procedure: AORTIC VALVE REPLACEMENT (AVR) SIZE 23 MM,  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;  Surgeon: Rexene Alberts, MD;  Location: [place];  Service: Open Heart Surgery;  Laterality: N/A;  . ASCENDING AORTIC ROOT REPLACEMENT N/A 05/15/2020   Procedure: HUMAN ALLOGRAFT AORTIC ROOT REPLACEMENT;  Surgeon: Rexene Alberts, MD;  Location: [place];  Service: Open Heart Surgery;  Laterality: N/A;  . COLONOSCOPY    . COLONOSCOPY N/A 05/03/2020   Procedure: COLONOSCOPY;  Surgeon: Juanita Craver, MD;  Location: [facility];  Service: Endoscopy;  Laterality: N/A;  . COLONOSCOPY W/ BIOPSIES AND POLYPECTOMY    . ENTEROSCOPY N/A 05/02/2020   Procedure: ENTEROSCOPY;  Surgeon: Rush Landmark Telford Nab., MD;  Location: [facility];  Service: Gastroenterology;  Laterality: N/A;  . HEMOSTASIS CLIP PLACEMENT  05/03/2020  Procedure: HEMOSTASIS CLIP PLACEMENT;  Surgeon: Juanita Craver, MD;  Location: [facility];  Service: Endoscopy;;  . HOT HEMOSTASIS N/A 05/03/2020   Procedure: HOT HEMOSTASIS (ARGON PLASMA COAGULATION/BICAP);  Surgeon: Juanita Craver, MD;  Location: [facility];  Service: Endoscopy;  Laterality: N/A;  . IR IMAGING GUIDED PORT INSERTION  07/05/2018  . POLYPECTOMY    . PORT-A-CATH REMOVAL Right 05/20/2020   Procedure: REMOVAL PORT-A-CATH;  Surgeon: Rexene Alberts, MD;  Location: [place];  Service: Thoracic;  Laterality: Right;  . RIGHT/LEFT HEART CATH AND  CORONARY ANGIOGRAPHY N/A 02/13/2020   Procedure: RIGHT/LEFT HEART CATH AND CORONARY ANGIOGRAPHY;  Surgeon: Belva Crome, MD;  Location: [facility];  Service: Cardiovascular;  Laterality: N/A;  . SUBMUCOSAL INJECTION  05/02/2020   Procedure: SUBMUCOSAL INJECTION;  Surgeon: Irving Copas., MD;  Location: [facility];  Service: Gastroenterology;;  . TEE WITHOUT CARDIOVERSION N/A 05/14/2020   Procedure: TRANSESOPHAGEAL ECHOCARDIOGRAM (TEE);  Surgeon: Sanda Klein, MD;  Location: [place];  Service: Cardiovascular;  Laterality: N/A;  . TEE WITHOUT CARDIOVERSION N/A 05/15/2020   Procedure: TRANSESOPHAGEAL ECHOCARDIOGRAM (TEE);  Surgeon: Rexene Alberts, MD;  Location: [place];  Service: Open Heart Surgery;  Laterality: N/A;    Current Medications: Current Meds  Medication Sig  . abiraterone acetate (ZYTIGA) 250 MG tablet TAKE 4 TABLETS (1,000 MG TOTAL) BY MOUTH DAILY. TAKE ON AN EMPTY STOMACH 1 HOUR BEFORE OR 2 HOURS AFTER A MEAL  . amoxicillin (AMOXIL) 500 MG tablet Take 4 tablets (2,000 mg total) 1 hour prior to all dental visits.  Marland Kitchen aspirin EC 81 MG tablet Take 81 mg by mouth daily. Swallow whole.  Marland Kitchen atorvastatin (LIPITOR) 40 MG tablet TAKE 1 TABLET EVERY DAY  . cholecalciferol (VITAMIN D3) 25 MCG (1000 UT) tablet Take 1,000 Units by mouth daily.  Marland Kitchen doxazosin (CARDURA) 8 MG tablet TAKE 1 TABLET AT BEDTIME  . ferrous sulfate 325 (65 FE) MG EC tablet Take 1 tablet (325 mg total) by mouth 2 (two) times daily before a meal.  . glipiZIDE (GLUCOTROL XL) 10 MG 24 hr tablet TAKE 1 TABLET TWICE DAILY  . labetalol (NORMODYNE) 200 MG tablet TAKE 3 TABLETS EVERY MORNING  AND TAKE 2 TABLETS EVERY EVENING  . levothyroxine (SYNTHROID) 150 MCG tablet TAKE 1 TABLET EVERY DAY  . metFORMIN (GLUCOPHAGE) 1000 MG tablet Take 1 tablet (1,000 mg total) by mouth 2 (two) times daily with a meal. OFFICE VISIT NEEDED FOR ADDITIONAL REFILLS  . omeprazole (PRILOSEC) 40 MG capsule TAKE 1 CAPSULE BY MOUTH  EVERY DAY  . potassium chloride SA (KLOR-CON) 20 MEQ tablet Take 2 tablets (40 mEq total) by mouth 2 (two) times daily.     Allergies:   Patient has no known allergies.   Social History   Socioeconomic History  . Marital status: Widowed    Spouse name: Not on file  . Number of children: Not on file  . Years of education: Not on file  . Highest education level: Not on file  Occupational History  . Not on file  Tobacco Use  . Smoking status: Former Research scientist (life sciences)  . Smokeless tobacco: Never Used  . Tobacco comment: quit 2005  Vaping Use  . Vaping Use: Never used  Substance and Sexual Activity  . Alcohol use: Not Currently    Alcohol/week: 21.0 standard drinks    Types: 21 Cans of beer per week  . Drug use: No  . Sexual activity: Not on file  Other Topics Concern  . Not  on file  Social History Narrative   He works as a Freight forwarder    Not married    No kids          Social Determinants of Radio broadcast assistant Strain: Low Risk   . Difficulty of Paying Living Expenses: Not hard at all  Food Insecurity: No Food Insecurity  . Worried About Charity fundraiser in the Last Year: Never true  . Ran Out of Food in the Last Year: Never true  Transportation Needs: No Transportation Needs  . Lack of Transportation (Medical): No  . Lack of Transportation (Non-Medical): No  Physical Activity: Inactive  . Days of Exercise per Week: 0 days  . Minutes of Exercise per Session: 0 min  Stress: No Stress Concern Present  . Feeling of Stress : Not at all  Social Connections: Socially Isolated  . Frequency of Communication with Friends and Family: More than three times a week  . Frequency of Social Gatherings with Friends and Family: More than three times a week  . Attends Religious Services: Never  . Active Member of Clubs or Organizations: No  . Attends Archivist Meetings: Never  . Marital Status: Widowed     Family History: The patient's family history includes  Aneurysm in his mother; Breast cancer in his brother, sister, and another family member; Hypertension in an other family member; Prostate cancer in his father. There is no history of Colon cancer, Esophageal cancer, Stomach cancer, or Rectal cancer.  ROS:   Please see the history of present illness.    All other systems reviewed and are negative.  EKGs/Labs/Other Studies Reviewed:    The following studies were reviewed today: Echo 06/24/2020: IMPRESSIONS    1. Left ventricular ejection fraction, by estimation, is 55 to 60%. The  left ventricle has normal function. The left ventricle has no regional  wall motion abnormalities. There is mild concentric left ventricular  hypertrophy. Left ventricular diastolic  parameters are consistent with Grade II diastolic dysfunction  (pseudonormalization). Elevated left ventricular end-diastolic pressure.  2. Right ventricular systolic function is normal. The right ventricular  size is normal. There is normal pulmonary artery systolic pressure.  3. Left atrial size was mildly dilated.  4. The mitral valve is normal in structure. Trivial mitral valve  regurgitation. No evidence of mitral stenosis.  5. Repaired aorta/aortic valve without stenosis or regurgitation. The  aortic valve has been repaired/replaced. Aortic valve regurgitation is not  visualized. No aortic stenosis is present. There is a valve present in the  aortic position. Procedure Date:  05/13/2020. Aortic valve area, by VTI measures 2.05 cm. Aortic valve mean  gradient measures 5.0 mmHg. Aortic valve Vmax measures 1.59 m/s.  6. The inferior vena cava is normal in size with greater than 50%  respiratory variability, suggesting right atrial pressure of 3 mmHg.   EKG:  EKG is not ordered today.    Recent Labs: 05/13/2020: B Natriuretic Peptide 484.0 05/16/2020: Magnesium 2.1 07/29/2020: TSH 0.62 08/26/2020: ALT 12; BUN 7; Creatinine 0.99; Hemoglobin 9.9; Platelet Count 186;  Potassium 3.7; Sodium 140  Recent Lipid Panel    Component Value Date/Time   CHOL 153 07/29/2020 0831   TRIG 129 07/29/2020 0831   HDL 48 07/29/2020 0831   CHOLHDL 3.2 07/29/2020 0831   VLDL 22.6 07/26/2019 0921   LDLCALC 82 07/29/2020 0831   LDLDIRECT 105.0 04/28/2016 0756     Risk Assessment/Calculations:       Physical Exam:  VS:  BP (!) 142/80   Pulse 63   Ht 5\' 8"  (1.727 m)   Wt 190 lb 6.4 oz (86.4 kg)   SpO2 99%   BMI 28.95 kg/m     Wt Readings from Last 3 Encounters:  12/22/20 190 lb 6.4 oz (86.4 kg)  08/28/20 185 lb (83.9 kg)  08/26/20 190 lb 11.2 oz (86.5 kg)     GEN:  Well nourished, well developed in no acute distress HEENT: Normal NECK: No JVD; No carotid bruits LYMPHATICS: No lymphadenopathy CARDIAC: RRR, there is a loud 4/6 systolic murmur at the left upper sternal border RESPIRATORY:  Clear to auscultation without rales, wheezing or rhonchi  ABDOMEN: Soft, non-tender, non-distended MUSCULOSKELETAL: 1+ edema left leg, trace edema on the right; No deformity  SKIN: Warm and dry NEUROLOGIC:  Alert and oriented x 3 PSYCHIATRIC:  Normal affect   ASSESSMENT:    1. Chronic diastolic heart failure ([place])   2. S/P aortic root replacement with human allograft   3. Essential hypertension    PLAN:    In order of problems listed above:  1. Stable on his current medical program.  This is reviewed today and includes torsemide and potassium supplementation.  Blood pressure appears to be controlled.  We will check a metabolic panel. 2. He has progressed very well from the time of surgery.  I am going to check an echocardiogram to evaluate his prominent murmur.  No suggestive symptoms of an ongoing problem.  He is given instructions on lifelong SBE prophylaxis when indicated. 3. Treated with doxazosin, labetalol, and torsemide as above.  Well-controlled.        Medication Adjustments/Labs and Tests Ordered: Current medicines are reviewed at length with  the patient today.  Concerns regarding medicines are outlined above.  Orders Placed This Encounter  Procedures  . CBC with Differential/Platelet  . Basic metabolic panel  . ECHOCARDIOGRAM COMPLETE   Meds ordered this encounter  Medications  . amoxicillin (AMOXIL) 500 MG tablet    Sig: Take 4 tablets (2,000 mg total) 1 hour prior to all dental visits.    Dispense:  12 tablet    Refill:  12    Patient Instructions  Medication Instructions:  Your provider discussed the importance of taking an antibiotic prior to all dental visits to prevent damage to the heart valves from infection. You were given a prescription for AMOXICILLIN 2,000 mg to take one hour prior to any dental appointment.  *If you need a refill on your cardiac medications before your next appointment, please call your pharmacy*  Lab Work: TODAY! BMET, CBC If you have labs (blood work) drawn today and your tests are completely normal, you will receive your results only by: Marland Kitchen MyChart Message (if you have MyChart) OR . A paper copy in the mail If you have any lab test that is abnormal or we need to change your treatment, we will call you to review the results.  Testing/Procedures: Your physician has requested that you have an echocardiogram. Echocardiography is a painless test that uses sound waves to create images of your heart. It provides your doctor with information about the size and shape of your heart and how well your heart's chambers and valves are working. This procedure takes approximately one hour. There are no restrictions for this procedure.  Follow-Up: At [facility], you and your health needs are our priority.  As part of our continuing mission to provide you with exceptional heart care, we have created designated  Provider Care Teams.  These Care Teams include your primary Cardiologist (physician) and Advanced Practice Providers (APPs -  Physician Assistants and Nurse Practitioners) who all work together to  provide you with the care you need, when you need it. Your next appointment:   6 month(s) The format for your next appointment:   In Person Provider:    Richardson Dopp, PA-C  Matthew Lis, PA-C      Signed, [name], MD  12/22/2020 8:35 AM    [place]

## 2020-12-22 NOTE — Progress Notes (Signed)
  Chronic Care Management   Note  12/22/2020 Name: Matthew Serrano. MRN: [id number] DOB: 01-18-1946  Matthew Serrano. is a 75 y.o. year old male who is a primary care patient of [name], NP. I reached out to Beecher Mcardle. by phone today in response to a referral sent by Mr. Matthew YEAGER Jr.'s PCP, [name], NP.   [name] was given information about Chronic Care Management services today including:  1. CCM service includes personalized support from designated clinical staff supervised by his physician, including individualized plan of care and coordination with other care providers 2. 24/7 contact phone numbers for assistance for urgent and routine care needs. 3. Service will only be billed when office clinical staff spend 20 minutes or more in a month to coordinate care. 4. Only one practitioner may furnish and bill the service in a calendar month. 5. The patient may stop CCM services at any time (effective at the end of the month) by phone call to the office staff.   Patient agreed to services and verbal consent obtained.   Follow up plan:   Carley Perdue [employer] Scheduler

## 2020-12-23 ENCOUNTER — Inpatient Hospital Stay: Payer: Medicare Other | Attending: Oncology

## 2020-12-23 ENCOUNTER — Inpatient Hospital Stay (HOSPITAL_BASED_OUTPATIENT_CLINIC_OR_DEPARTMENT_OTHER): Payer: Medicare Other | Admitting: Oncology

## 2020-12-23 ENCOUNTER — Inpatient Hospital Stay: Payer: Medicare Other

## 2020-12-23 VITALS — BP 129/78 | HR 65 | Temp 96.9°F | Resp 18 | Wt 189.6 lb

## 2020-12-23 DIAGNOSIS — Z79899 Other long term (current) drug therapy: Secondary | ICD-10-CM | POA: Diagnosis not present

## 2020-12-23 DIAGNOSIS — C61 Malignant neoplasm of prostate: Secondary | ICD-10-CM | POA: Insufficient documentation

## 2020-12-23 DIAGNOSIS — E876 Hypokalemia: Secondary | ICD-10-CM | POA: Diagnosis not present

## 2020-12-23 DIAGNOSIS — R591 Generalized enlarged lymph nodes: Secondary | ICD-10-CM | POA: Insufficient documentation

## 2020-12-23 LAB — CBC WITH DIFFERENTIAL (CANCER CENTER ONLY)
Abs Immature Granulocytes: 0.03 10*3/uL (ref 0.00–0.07)
Basophils Absolute: 0 10*3/uL (ref 0.0–0.1)
Basophils Relative: 0 %
Eosinophils Absolute: 0.1 10*3/uL (ref 0.0–0.5)
Eosinophils Relative: 1 %
HCT: 33.7 % — ABNORMAL LOW (ref 39.0–52.0)
Hemoglobin: 10.8 g/dL — ABNORMAL LOW (ref 13.0–17.0)
Immature Granulocytes: 0 %
Lymphocytes Relative: 22 %
Lymphs Abs: 1.7 10*3/uL (ref 0.7–4.0)
MCH: 27.6 pg (ref 26.0–34.0)
MCHC: 32 g/dL (ref 30.0–36.0)
MCV: 86 fL (ref 80.0–100.0)
Monocytes Absolute: 0.5 10*3/uL (ref 0.1–1.0)
Monocytes Relative: 7 %
Neutro Abs: 5.3 10*3/uL (ref 1.7–7.7)
Neutrophils Relative %: 70 %
Platelet Count: 187 10*3/uL (ref 150–400)
RBC: 3.92 MIL/uL — ABNORMAL LOW (ref 4.22–5.81)
RDW: 14.1 % (ref 11.5–15.5)
WBC Count: 7.6 10*3/uL (ref 4.0–10.5)
nRBC: 0 % (ref 0.0–0.2)

## 2020-12-23 LAB — CMP (CANCER CENTER ONLY)
ALT: 17 U/L (ref 0–44)
AST: 19 U/L (ref 15–41)
Albumin: 3.6 g/dL (ref 3.5–5.0)
Alkaline Phosphatase: 61 U/L (ref 38–126)
Anion gap: 9 (ref 5–15)
BUN: 17 mg/dL (ref 8–23)
CO2: 30 mmol/L (ref 22–32)
Calcium: 8.8 mg/dL — ABNORMAL LOW (ref 8.9–10.3)
Chloride: 104 mmol/L (ref 98–111)
Creatinine: 0.98 mg/dL (ref 0.61–1.24)
GFR, Estimated: >60 mL/min (ref >60)
Glucose, Bld: 114 mg/dL — ABNORMAL HIGH (ref 70–99)
Potassium: 3.2 mmol/L — ABNORMAL LOW (ref 3.5–5.1)
Sodium: 143 mmol/L (ref 135–145)
Total Bilirubin: 0.6 mg/dL (ref 0.3–1.2)
Total Protein: 6.1 g/dL — ABNORMAL LOW (ref 6.5–8.1)

## 2020-12-23 MED ORDER — LEUPROLIDE ACETATE (4 MONTH) 30 MG ~~LOC~~ KIT
30.0000 mg | PACK | Freq: Once | SUBCUTANEOUS | Status: AC
  Administered 2020-12-23: 30 mg via SUBCUTANEOUS

## 2020-12-23 MED ORDER — LEUPROLIDE ACETATE (4 MONTH) 30 MG ~~LOC~~ KIT
PACK | SUBCUTANEOUS | Status: AC
  Filled 2020-12-23: qty 30

## 2020-12-23 NOTE — Progress Notes (Signed)
Hematology and Oncology Follow Up Visit  Matthew Serrano [id number] 11-23-1945 75 y.o. 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   Prior Therapy:  Status post prostate biopsy obtained on 06/06/2018 which showed high-volume disease with a Gleason score 4+5 = 9.  Taxotere chemotherapy at 75 mg/m every 3 weeks started on 07/07/2018.  He he is here for cycle 6 of therapy.  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  Current therapy:   Eligard 30 mg every 4 months.  He received Eligard on August 26, 2020 and will be repeated in May 2022.  Zytiga 1000 mg daily started in April 2020.  Interim History: Matthew Serrano is here for a follow-up visit.  Since the last visit, he reports no major changes in his health.  He denies any recent hospitalization or illnesses.  He denies any worsening chest pain, shortness of breath or difficulty breathing.  He continues to tolerate Zytiga without any decline in his performance status or quality of life.  He denies any worsening edema or bone pain.                   Medications: Unchanged on review. Current Outpatient Medications  Medication Sig Dispense Refill  . abiraterone acetate (ZYTIGA) 250 MG tablet TAKE 4 TABLETS (1,000 MG TOTAL) BY MOUTH DAILY. TAKE ON AN EMPTY STOMACH 1 HOUR BEFORE OR 2 HOURS AFTER A MEAL 120 tablet 0  . amoxicillin (AMOXIL) 500 MG tablet Take 4 tablets (2,000 mg total) 1 hour prior to all dental visits. 12 tablet 12  . aspirin EC 81 MG tablet Take 81 mg by mouth daily. Swallow whole.    Marland Kitchen atorvastatin (LIPITOR) 40 MG tablet TAKE 1 TABLET EVERY DAY 90 tablet 2  . cholecalciferol (VITAMIN D3) 25 MCG (1000 UT) tablet Take 1,000 Units by mouth daily.    Marland Kitchen doxazosin (CARDURA) 8 MG  tablet TAKE 1 TABLET AT BEDTIME 90 tablet 3  . ferrous sulfate 325 (65 FE) MG EC tablet Take 1 tablet (325 mg total) by mouth 2 (two) times daily before a meal. 60 tablet 2  . glipiZIDE (GLUCOTROL XL) 10 MG 24 hr tablet TAKE 1 TABLET TWICE DAILY 180 tablet 0  . labetalol (NORMODYNE) 200 MG tablet TAKE 3 TABLETS EVERY MORNING  AND TAKE 2 TABLETS EVERY EVENING 450 tablet 3  . levothyroxine (SYNTHROID) 150 MCG tablet TAKE 1 TABLET EVERY DAY 90 tablet 0  . metFORMIN (GLUCOPHAGE) 1000 MG tablet Take 1 tablet (1,000 mg total) by mouth 2 (two) times daily with a meal. OFFICE VISIT NEEDED FOR ADDITIONAL REFILLS 180 tablet 0  . omeprazole (PRILOSEC) 40 MG capsule TAKE 1 CAPSULE BY MOUTH EVERY DAY 90 capsule 1  . potassium chloride SA (KLOR-CON) 20 MEQ tablet Take 2 tablets (40 mEq total) by mouth 2 (two) times daily. 360 tablet 3  . torsemide (DEMADEX) 20 MG tablet Take 1 tablet (20 mg total) by mouth 2 (two) times daily. 180 tablet 3   No current facility-administered medications for this visit.     Allergies: No Known Allergies      Physical Exam:    Blood pressure 129/78, pulse 65, temperature (!) 96.9 F (36.1 C), temperature source Tympanic, resp. rate 18, weight 189 lb 9.6 oz (86 kg), SpO2 100 %.  ECOG: 0    General appearance: Comfortable appearing without any discomfort Head: Normocephalic without any trauma Oropharynx: Mucous membranes are moist and pink without any thrush or ulcers. Eyes: Pupils are equal and round reactive to light. Lymph nodes: No cervical, supraclavicular, inguinal or axillary lymphadenopathy.   Heart:regular rate and rhythm.  S1 and S2 without leg edema. Lung: Clear without any rhonchi or wheezes.  No dullness to percussion. Abdomin: Soft, nontender, nondistended with good bowel sounds.  No hepatosplenomegaly. Musculoskeletal: No joint deformity or effusion.  Full range of motion noted. Neurological: No deficits noted on motor, sensory and  deep tendon reflex exam. Skin: No petechial rash or dryness.  Appeared moist.               Lab Results: Lab Results  Component Value Date   WBC 6.6 12/22/2020   HGB 10.3 (L) 12/22/2020   HCT 31.5 (L) 12/22/2020   MCV 83 12/22/2020   PLT 190 12/22/2020     Chemistry      Component Value Date/Time   NA 144 12/22/2020 0847   K 3.4 (L) 12/22/2020 0847   CL 105 12/22/2020 0847   CO2 28 12/22/2020 0847   BUN 19 12/22/2020 0847   CREATININE 1.00 12/22/2020 0847   CREATININE 0.99 08/26/2020 1207   CREATININE 1.13 07/29/2020 0831      Component Value Date/Time   CALCIUM 9.0 12/22/2020 0847   ALKPHOS 65 08/26/2020 1207   AST 14 (L) 08/26/2020 1207   ALT 12 08/26/2020 1207   BILITOT 0.8 08/26/2020 1207          Results for Serrano, Matthew GILCHREST (MRN [id number]) as of 12/23/2020 09:11  Ref. Range 08/26/2020 12:07  Prostate Specific Ag, Serum Latest Ref Range: 0.0 - 4.0 ng/mL <0.1             Impression and Plan:  75 year old man with:  1.  Advanced prostate cancer with lymphadenopathy diagnosed in 2019.  He has castration-resistant disease at this time.     He continues to have excellent response to PSA with undetectable level.  Risks and benefits of continuing Zytiga were discussed at this time.  Complications that include nausea, fatigue, weight gain, hypertension and hypokalemia.  Alternative treatment options would be radiopharmaceutical, Jevtana chemotherapy among other options.   2.  Androgen deprivation: He is currently on Eligard which she will receive today and repeated in 4 months.  Complications including weight gain, hot flashes and hyperlipidemia among others.  3.  Goals of care and prognosis: Therapy remains palliative despite his excellent PSA response.  His performance status is good and aggressive measures are warranted.  4.  Hypokalemia: Remains mild and manageable at this time.  This is related to Zytiga.   5.  Follow-up: He will  return in 4 months for repeat follow-up.  30  minutes were dedicated to this visit.  The time was spent on reviewing laboratory data, disease status update, treatment options and future plan of care discussion.      [name], MD 5/3/20229:08 AM      And

## 2020-12-23 NOTE — Patient Instructions (Signed)

## 2020-12-24 ENCOUNTER — Other Ambulatory Visit (HOSPITAL_COMMUNITY): Payer: Self-pay

## 2020-12-24 ENCOUNTER — Ambulatory Visit (HOSPITAL_COMMUNITY): Payer: Medicare Other | Attending: Cardiology

## 2020-12-24 ENCOUNTER — Telehealth: Payer: Self-pay | Admitting: *Deleted

## 2020-12-24 ENCOUNTER — Other Ambulatory Visit: Payer: Self-pay

## 2020-12-24 DIAGNOSIS — I10 Essential (primary) hypertension: Secondary | ICD-10-CM | POA: Diagnosis not present

## 2020-12-24 DIAGNOSIS — Z954 Presence of other heart-valve replacement: Secondary | ICD-10-CM | POA: Insufficient documentation

## 2020-12-24 DIAGNOSIS — I5032 Chronic diastolic (congestive) heart failure: Secondary | ICD-10-CM | POA: Insufficient documentation

## 2020-12-24 LAB — PROSTATE-SPECIFIC AG, SERUM (LABCORP): Prostate Specific Ag, Serum: <0.1 ng/mL (ref 0.0–4.0)

## 2020-12-24 NOTE — Telephone Encounter (Signed)
-----   Message from Matthew Portela, MD sent at 12/24/2020  8:36 AM EDT ----- Please let him know his PSA is still low

## 2020-12-24 NOTE — Telephone Encounter (Signed)
Contacted patient regarding test results per Dr. Shadad. Patient verbalized understanding  

## 2020-12-25 LAB — ECHOCARDIOGRAM COMPLETE
AR max vel: 1.73 cm2
AV Area VTI: 1.82 cm2
AV Area mean vel: 1.69 cm2
AV Mean grad: 5 mmHg
AV Peak grad: 11 mmHg
Ao pk vel: 1.66 m/s
Area-P 1/2: 2.93 cm2
MV M vel: 4.56 m/s
MV Peak grad: 83.2 mmHg
P 1/2 time: 366 msec
Radius: 0.55 cm
S' Lateral: 3.3 cm

## 2021-01-01 ENCOUNTER — Telehealth: Payer: Self-pay

## 2021-01-01 DIAGNOSIS — E876 Hypokalemia: Secondary | ICD-10-CM

## 2021-01-01 NOTE — Telephone Encounter (Signed)
-----   Message from [name], MD sent at 12/31/2020  8:49 AM EDT ----- Agree with recommendations to resume KDur as previously prescribed. Repeat BMET 3-4 weeks. thanks

## 2021-01-01 NOTE — Telephone Encounter (Signed)
Confirmed the patient has restarted K supplement and reiterated to him to continue taking it daily. Scheduled him for repeat BMET 5/31.  He was grateful for call and agrees with plan.

## 2021-01-02 ENCOUNTER — Other Ambulatory Visit: Payer: Self-pay | Admitting: Adult Health

## 2021-01-02 DIAGNOSIS — I10 Essential (primary) hypertension: Secondary | ICD-10-CM

## 2021-01-04 ENCOUNTER — Other Ambulatory Visit: Payer: Self-pay | Admitting: Nurse Practitioner

## 2021-01-20 ENCOUNTER — Other Ambulatory Visit: Payer: Self-pay | Admitting: *Deleted

## 2021-01-20 ENCOUNTER — Other Ambulatory Visit: Payer: Medicare Other | Admitting: *Deleted

## 2021-01-20 ENCOUNTER — Other Ambulatory Visit: Payer: Self-pay

## 2021-01-20 ENCOUNTER — Other Ambulatory Visit: Payer: Self-pay | Admitting: Oncology

## 2021-01-20 ENCOUNTER — Other Ambulatory Visit (HOSPITAL_COMMUNITY): Payer: Self-pay

## 2021-01-20 DIAGNOSIS — E876 Hypokalemia: Secondary | ICD-10-CM

## 2021-01-20 DIAGNOSIS — C61 Malignant neoplasm of prostate: Secondary | ICD-10-CM

## 2021-01-20 LAB — BASIC METABOLIC PANEL
BUN/Creatinine Ratio: 28 — ABNORMAL HIGH (ref 10–24)
BUN: 30 mg/dL — ABNORMAL HIGH (ref 8–27)
CO2: 22 mmol/L (ref 20–29)
Calcium: 9 mg/dL (ref 8.6–10.2)
Chloride: 104 mmol/L (ref 96–106)
Creatinine, Ser: 1.08 mg/dL (ref 0.76–1.27)
Glucose: 92 mg/dL (ref 65–99)
Potassium: 4.1 mmol/L (ref 3.5–5.2)
Sodium: 141 mmol/L (ref 134–144)
eGFR: 72 mL/min/{1.73_m2} (ref >59)

## 2021-01-20 MED ORDER — ABIRATERONE ACETATE 250 MG PO TABS
ORAL_TABLET | Freq: Every day | ORAL | 0 refills | Status: DC
  Filled 2021-01-20: qty 120, 30d supply, fill #0

## 2021-01-20 MED ORDER — PREDNISONE 5 MG PO TABS
ORAL_TABLET | Freq: Every day | ORAL | 3 refills | Status: DC
  Filled 2021-01-20: qty 90, 90d supply, fill #0
  Filled 2021-02-19 – 2021-04-17 (×2): qty 90, 90d supply, fill #1
  Filled 2021-05-15 – 2021-07-15 (×2): qty 90, 90d supply, fill #2
  Filled 2021-10-14: qty 90, 90d supply, fill #3

## 2021-01-21 ENCOUNTER — Other Ambulatory Visit: Payer: Self-pay

## 2021-01-22 ENCOUNTER — Other Ambulatory Visit: Payer: Self-pay | Admitting: Adult Health

## 2021-01-22 ENCOUNTER — Encounter: Payer: Self-pay | Admitting: Adult Health

## 2021-01-22 ENCOUNTER — Ambulatory Visit (INDEPENDENT_AMBULATORY_CARE_PROVIDER_SITE_OTHER): Payer: Medicare Other | Admitting: Adult Health

## 2021-01-22 VITALS — BP 130/62 | HR 73 | Temp 98.8°F | Ht 68.0 in | Wt 183.6 lb

## 2021-01-22 DIAGNOSIS — I10 Essential (primary) hypertension: Secondary | ICD-10-CM | POA: Diagnosis not present

## 2021-01-22 DIAGNOSIS — E0800 Diabetes mellitus due to underlying condition with hyperosmolarity without nonketotic hyperglycemic-hyperosmolar coma (NKHHC): Secondary | ICD-10-CM | POA: Diagnosis not present

## 2021-01-22 DIAGNOSIS — E119 Type 2 diabetes mellitus without complications: Secondary | ICD-10-CM

## 2021-01-22 LAB — POCT GLYCOSYLATED HEMOGLOBIN (HGB A1C): Hemoglobin A1C: 6 % — AB (ref 4.0–5.6)

## 2021-01-22 NOTE — Patient Instructions (Addendum)
It was great seeing you today   Your blood pressure and diabetes is well controlled.   Your A1c was 6.0   We do not need to change any medications   I will see you back after December 7th for your physical exam

## 2021-01-22 NOTE — Progress Notes (Signed)
Subjective:    Patient ID: Matthew Serrano., male    DOB: 1945/10/29, 75 y.o.   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  He presents to the office today for six month follow up regarding DM and HTN   DM -currently maintained on glipizide 10 mg extended release twice daily and metformin 1000 mg twice daily.  He denies dizziness, lightheadedness or numbness in his feet  Lab Results  Component Value Date   HGBA1C 5.6 07/29/2020    HTN -describe doxazosin 8 mg and clobetasol 600 mg in the morning and 400 mg in the evening as well as torsemide 20 mg twice daily.  He denies dizziness, lightheadedness, chest pain, or shortness of breath BP Readings from Last 3 Encounters:  01/22/21 130/62  12/23/20 129/78  12/22/20 (!) 142/80    Review of Systems See HPI   Past Medical History:  Diagnosis Date  . Aortic valve endocarditis 05/13/2020   STREPTOCOCCUS ANGINOSIS  . AVM (arteriovenous malformation) of colon   . Cancer [facility])    prostate cancer  . DIABETES MELLITUS, TYPE II 07/06/2007  . ED (erectile dysfunction)   . Elevated PSA 01/20/2012  . 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 History of nuclear stress test    Myoview 8/18: EF 64, inferobasal thinning, no ischemia, low risk  . HYPERLIPIDEMIA 07/06/2007  . HYPERTENSION 07/06/2007  . HYPOTHYROIDISM 07/06/2007  . Leg pain    ABIs 8/18:  Normal   . Obesity   . S/P  aortic root replacement with human allograft 05/15/2020   23 mm human aortic root graft with 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 and reimplantation of left main and right coronary arteries  . Severe aortic insufficiency 05/13/2020   acute onset in setting of bacterial endocarditis  . Severe aortic stenosis     Social History   Socioeconomic History  . Marital status: Widowed    Spouse name: Not on file  . Number of children: Not on file  . Years of education: Not on file  . Highest education level: Not on file  Occupational History  . Not on file  Tobacco Use  . Smoking status: Former Research scientist (life sciences)  . Smokeless tobacco: Never Used  . Tobacco comment: quit 2005  Vaping Use  . Vaping Use: Never used  Substance and Sexual Activity  . Alcohol use: Not Currently    Alcohol/week: 21.0 standard drinks    Types: 21 Cans of beer per week  . Drug use: No  . Sexual activity: Not on file  Other Topics Concern  . Not on file  Social History Narrative   He works as a Freight forwarder    Not married    No kids          Social Determinants of Radio broadcast assistant Strain: Low Risk   . Difficulty of Paying Living Expenses: Not hard at all  Food Insecurity: No Food Insecurity  . Worried About Charity fundraiser in the Last Year: Never true  . Ran Out of Food in  the Last Year: Never true  Transportation Needs: No Transportation Needs  . Lack of Transportation (Medical): No  . Lack of Transportation (Non-Medical): No  Physical Activity: Inactive  . Days of Exercise per Week: 0 days  . Minutes of Exercise per Session: 0 min  Stress: No Stress Concern Present  . Feeling of Stress : Not at all  Social Connections: Socially Isolated  . Frequency of Communication with Friends and Family: More than three times a week  . Frequency of Social Gatherings with Friends and Family: More than three times a week  . Attends Religious Services: Never  . Active Member of Clubs or  Organizations: No  . Attends Archivist Meetings: Never  . Marital Status: Widowed  Intimate Partner Violence: Not At Risk  . Fear of Current or Ex-Partner: No  . Emotionally Abused: No  . Physically Abused: No  . Sexually Abused: No    Past Surgical History:  Procedure Laterality Date  . AORTIC VALVE REPLACEMENT N/A 05/15/2020   Procedure: AORTIC VALVE REPLACEMENT (AVR) SIZE 23 MM,  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;  Surgeon: Rexene Alberts, MD;  Location: [facility];  Service: Open Heart Surgery;  Laterality: N/A;  . ASCENDING AORTIC ROOT REPLACEMENT N/A 05/15/2020   Procedure: HUMAN ALLOGRAFT AORTIC ROOT REPLACEMENT;  Surgeon: Rexene Alberts, MD;  Location: [facility];  Service: Open Heart Surgery;  Laterality: N/A;  . COLONOSCOPY    . COLONOSCOPY N/A 05/03/2020   Procedure: COLONOSCOPY;  Surgeon: Juanita Craver, MD;  Location: [facility];  Service: Endoscopy;  Laterality: N/A;  . COLONOSCOPY W/ BIOPSIES AND POLYPECTOMY    . ENTEROSCOPY N/A 05/02/2020   Procedure: ENTEROSCOPY;  Surgeon: Rush Landmark Telford Nab., MD;  Location: [facility];  Service: Gastroenterology;  Laterality: N/A;  . HEMOSTASIS CLIP PLACEMENT  05/03/2020   Procedure: HEMOSTASIS CLIP PLACEMENT;  Surgeon: Juanita Craver, MD;  Location: [facility];  Service: Endoscopy;;  . HOT HEMOSTASIS N/A 05/03/2020   Procedure: HOT HEMOSTASIS (ARGON PLASMA COAGULATION/BICAP);  Surgeon: Juanita Craver, MD;  Location: [facility];  Service: Endoscopy;  Laterality: N/A;  . IR IMAGING GUIDED PORT INSERTION  07/05/2018  . POLYPECTOMY    . PORT-A-CATH REMOVAL Right 05/20/2020   Procedure: REMOVAL PORT-A-CATH;  Surgeon: Rexene Alberts, MD;  Location: [place];  Service: Thoracic;  Laterality: Right;  . RIGHT/LEFT HEART CATH AND CORONARY ANGIOGRAPHY N/A 02/13/2020   Procedure: RIGHT/LEFT HEART CATH AND CORONARY ANGIOGRAPHY;  Surgeon: Belva Crome, MD;  Location: [facility];  Service: Cardiovascular;  Laterality:  N/A;  . SUBMUCOSAL INJECTION  05/02/2020   Procedure: SUBMUCOSAL INJECTION;  Surgeon: Irving Copas., MD;  Location: [facility];  Service: Gastroenterology;;  . TEE WITHOUT CARDIOVERSION N/A 05/14/2020   Procedure: TRANSESOPHAGEAL ECHOCARDIOGRAM (TEE);  Surgeon: Sanda Klein, MD;  Location: [facility];  Service: Cardiovascular;  Laterality: N/A;  . TEE WITHOUT CARDIOVERSION N/A 05/15/2020   Procedure: TRANSESOPHAGEAL ECHOCARDIOGRAM (TEE);  Surgeon: Rexene Alberts, MD;  Location: [facility];  Service: Open Heart Surgery;  Laterality: N/A;    Family History  Problem Relation Age of Onset  . Hypertension Other   . Breast cancer Sister   . Breast cancer Other        family history  . Breast cancer Brother        prostate ca  . Aneurysm Mother        Brain aneurysm   . Prostate cancer Father   . Colon cancer Neg Hx   .  Esophageal cancer Neg Hx   . Stomach cancer Neg Hx   . Rectal cancer Neg Hx     No Known Allergies  Current Outpatient Medications on File Prior to Visit  Medication Sig Dispense Refill  . abiraterone acetate (ZYTIGA) 250 MG tablet TAKE 4 TABLETS (1,000 MG TOTAL) BY MOUTH DAILY. TAKE ON AN EMPTY STOMACH 1 HOUR BEFORE OR 2 HOURS AFTER A MEAL 120 tablet 0  . amoxicillin (AMOXIL) 500 MG tablet Take 4 tablets (2,000 mg total) 1 hour prior to all dental visits. 12 tablet 12  . aspirin EC 81 MG tablet Take 81 mg by mouth daily. Swallow whole.    Marland Kitchen atorvastatin (LIPITOR) 40 MG tablet TAKE 1 TABLET EVERY DAY 90 tablet 2  . cholecalciferol (VITAMIN D3) 25 MCG (1000 UT) tablet Take 1,000 Units by mouth daily.    Marland Kitchen doxazosin (CARDURA) 8 MG tablet TAKE 1 TABLET AT BEDTIME 90 tablet 3  . ferrous sulfate 325 (65 FE) MG EC tablet Take 1 tablet (325 mg total) by mouth 2 (two) times daily before a meal. 60 tablet 2  . glipiZIDE (GLUCOTROL XL) 10 MG 24 hr tablet TAKE 1 TABLET TWICE DAILY 180 tablet 0  . labetalol (NORMODYNE) 200 MG tablet TAKE 3 TABLETS EVERY MORNING  AND TAKE  2 TABLETS EVERY EVENING 450 tablet 3  . levothyroxine (SYNTHROID) 150 MCG tablet TAKE 1 TABLET EVERY DAY 90 tablet 0  . metFORMIN (GLUCOPHAGE) 1000 MG tablet Take 1 tablet (1,000 mg total) by mouth 2 (two) times daily with a meal. OFFICE VISIT NEEDED FOR ADDITIONAL REFILLS 180 tablet 0  . omeprazole (PRILOSEC) 40 MG capsule TAKE 1 CAPSULE BY MOUTH EVERY DAY 90 capsule 1  . potassium chloride SA (KLOR-CON) 20 MEQ tablet Take 2 tablets (40 mEq total) by mouth 2 (two) times daily. 360 tablet 3  . predniSONE (DELTASONE) 5 MG tablet TAKE 1 TABLET BY MOUTH ONCE A DAY WITH BREAKFAST 90 tablet 3  . torsemide (DEMADEX) 20 MG tablet Take 1 tablet (20 mg total) by mouth 2 (two) times daily. 180 tablet 3   No current facility-administered medications on file prior to visit.    BP 130/62 (BP Location: Left Arm, Patient Position: Sitting, Cuff Size: Normal)   Pulse 73   Temp 98.8 F (37.1 C) (Oral)   Ht 5\' 8"  (1.727 m)   Wt 183 lb 9.6 oz (83.3 kg)   SpO2 98%   BMI 27.92 kg/m       Objective:   Physical Exam Vitals and nursing note reviewed.  Constitutional:      Appearance: Normal appearance.  Cardiovascular:     Rate and Rhythm: Normal rate and regular rhythm.     Pulses: Normal pulses.     Heart sounds: Normal heart sounds.  Pulmonary:     Effort: Pulmonary effort is normal.     Breath sounds: Normal breath sounds.  Musculoskeletal:        General: Normal range of motion.  Skin:    General: Skin is warm and dry.     Capillary Refill: Capillary refill takes less than 2 seconds.  Neurological:     General: No focal deficit present.     Mental Status: He is alert and oriented to person, place, and time.  Psychiatric:        Mood and Affect: Mood normal.        Behavior: Behavior normal.        Thought Content: Thought content normal.  Judgment: Judgment normal.       Assessment & Plan:  1. Diabetes mellitus due to underlying condition with hyperosmolarity without coma,  without long-term current use of insulin (HCC)  - POCT glycosylated hemoglobin (Hb A1C)- 6.0 - at goal  - No change in medication - Follow up in 6 months for CPE   2. Essential hypertension - well controlled. No change in medications at this time   [name], NP

## 2021-01-23 ENCOUNTER — Other Ambulatory Visit (HOSPITAL_COMMUNITY): Payer: Self-pay

## 2021-01-26 ENCOUNTER — Telehealth: Payer: Self-pay | Admitting: Pharmacist

## 2021-01-26 NOTE — Chronic Care Management (AMB) (Signed)
Chronic Care Management Pharmacy Assistant   Name: Terrence Wishon.  MRN: [id number] DOB: 07/21/1946  Matthew Serrano. is an 75 y.o. year old male who presents for his initial CCM visit with the clinical pharmacist.  Reason for Encounter: Chart Prep for initial visit on 01/29/21 with Jeni Salles   Conditions to be addressed/monitored: CHF, HTN, HLD, DMII and Hypothyroisism and Prostate Cancer.  Recent office visits:  01/22/21 [name] NP (PCP) - seen for diabetes mellitus. No medication changes. Come back after december.   Recent consult visits:  12/23/20 [name] MD ( Oncology) - presented to clinic for follow up on prostate cancer. No medication changes. Follow up in 4 months.   12/22/20 [name] MD ( Cardiology) - presented to clinic for follow up on heart failure and other chronic conditions. Refilled amoxicillin 500mg  for dental visits as needed. Follow up in 6 months.  09/08/20 Darylene Price MD (Cardiothoracic Surgery) - seen for follow up after aortic root replacement. No medication changes and no follow up noted.   08/28/20 Michel Bickers MD (Infectious Disease) - seen for aortic valve endocarditis. No medication changes. Follow up as noted.   08/26/20 [name] MD ( Oncology) - seen for follow up for prostate cancer. No medication changes. Follow up in 4 months.   08/07/20 Michel Bickers MD ( Infectious Disease ) -  Seen for aortic valve endocarditis. No medication changes. Follow up in 3 weeks.  07/28/20 Richardson Dopp PA-C( Cardiology ) - seen for heart failure and other chronic conditions. No medication changes. Follow up in 4 months.   Hospital visits:  None in previous 6 months  Fill History:   atorvastatin 40 mg tablet 04/16/2020 90   DOXAZOSIN MESYLATE 8 MG TAB 07/29/2020 90   labetalol (NORMODYNE) tablet 08/29/2020 9   levothyroxine 150 mcg tablet 06/13/2020 90   OMEPRAZOLE DR 40 MG CAPSULE 07/08/2020 90   POTASSIUM CL ER 20 MEQ TABLET  08/03/2020 90   torsemide 20 mg tablet 06/30/2020 90   metFORMIN (GLUCOPHAGE) tablet 08/25/2020 90    Medications: Outpatient Encounter Medications as of 01/26/2021  Medication Sig  . abiraterone acetate (ZYTIGA) 250 MG tablet TAKE 4 TABLETS (1,000 MG TOTAL) BY MOUTH DAILY. TAKE ON AN EMPTY STOMACH 1 HOUR BEFORE OR 2 HOURS AFTER A MEAL  . amoxicillin (AMOXIL) 500 MG tablet Take 4 tablets (2,000 mg total) 1 hour prior to all dental visits.  Marland Kitchen aspirin EC 81 MG tablet Take 81 mg by mouth daily. Swallow whole.  Marland Kitchen atorvastatin (LIPITOR) 40 MG tablet TAKE 1 TABLET EVERY DAY  . cholecalciferol (VITAMIN D3) 25 MCG (1000 UT) tablet Take 1,000 Units by mouth daily.  Marland Kitchen doxazosin (CARDURA) 8 MG tablet TAKE 1 TABLET AT BEDTIME  . ferrous sulfate 325 (65 FE) MG EC tablet Take 1 tablet (325 mg total) by mouth 2 (two) times daily before a meal.  . glipiZIDE (GLUCOTROL XL) 10 MG 24 hr tablet TAKE 1 TABLET TWICE DAILY  . labetalol (NORMODYNE) 200 MG tablet TAKE 3 TABLETS EVERY MORNING  AND TAKE 2 TABLETS EVERY EVENING  . levothyroxine (SYNTHROID) 150 MCG tablet TAKE 1 TABLET EVERY DAY  . metFORMIN (GLUCOPHAGE) 1000 MG tablet TAKE 1 TABLET TWICE DAILY WITH MEALS (NEED MD APPOINTMENT FOR REFILLS)  . omeprazole (PRILOSEC) 40 MG capsule TAKE 1 CAPSULE BY MOUTH EVERY DAY  . potassium chloride SA (KLOR-CON) 20 MEQ tablet Take 2 tablets (40 mEq total) by mouth 2 (two) times daily.  Marland Kitchen  predniSONE (DELTASONE) 5 MG tablet TAKE 1 TABLET BY MOUTH ONCE A DAY WITH BREAKFAST  . torsemide (DEMADEX) 20 MG tablet Take 1 tablet (20 mg total) by mouth 2 (two) times daily.   No facility-administered encounter medications on file as of 01/26/2021.    Have you seen any other providers since your last visit? Yes. Patient saw Dr. Copper his cardiologist.   Any changes in your medications or health? No. Not to his knowledge.  Any side effects from any medications? No issues with his medications or side effects at this time.   Do  you have an symptoms or problems not managed by your medications? No.   Any concerns about your health right now? No.  Has your provider asked that you check blood pressure, blood sugar, or follow special diet at home? Yes. Patient checks his blood pressure and blood glucose at home.   Do you get any type of exercise on a regular basis? Yes. Patient does yard work.   Can you think of a goal you would like to reach for your health? Yes.  Patient would like to maintain his current health but he would like to start walking more regularly.   Do you have any problems getting your medications? No.   Is there anything that you would like to discuss during the appointment? There is nothing patient can think of at this time.   Please bring medications and supplements to appointment  Notes:  Spoke with patient and went over medications as listed and patient reports taking all medications as prescribed. Patient was reminded to have all medications close by and any blood pressure or blood glucose reading available as well. Patient thanked me for my call.   Star Rating Drugs:   Atorvastatin 40mg  - last filled on 04/16/20 90DS at [facility] 10mg  - last filled on 01/24/20 90DS at [facility]  Metformin 1000mg  - last filled on 08/25/20 [place]  Clinical Pharmacist Assistant [phone]

## 2021-01-29 ENCOUNTER — Ambulatory Visit (INDEPENDENT_AMBULATORY_CARE_PROVIDER_SITE_OTHER): Payer: Medicare Other | Admitting: Pharmacist

## 2021-01-29 DIAGNOSIS — E119 Type 2 diabetes mellitus without complications: Secondary | ICD-10-CM

## 2021-01-29 DIAGNOSIS — I10 Essential (primary) hypertension: Secondary | ICD-10-CM

## 2021-01-29 NOTE — Progress Notes (Signed)
Chronic Care Management Pharmacy Note  02/19/2021 Name:  Matthew Serrano. MRN:  [id number] DOB:  1946-01-26  Summary: A1c is at goal of < 7% but patient is not taking glipizide as prescribed LDL not at goal < 70  Recommendations/Changes made from today's visit: -Recommended new updated prescription for glipizide to be sent to pharmacy -Recommended increasing atorvastatin to 80 mg to lower LDL to goal < 70 -Recommended referral for ophthalmologist for diabetic eye exam -Consider switching doxazosin for alternative more effective blood pressure medication due to lack of BPH symptoms  Plan: Follow up in 3 months   Subjective: Matthew Serrano. is an 75 y.o. year old male who is a primary patient of [name], NP.  The CCM team was consulted for assistance with disease management and care coordination needs.    Engaged with patient by telephone for follow up visit in response to provider referral for pharmacy case management and/or care coordination services.   Consent to Services:  The patient was given the following information about Chronic Care Management services today, agreed to services, and gave verbal consent: 1. CCM service includes personalized support from designated clinical staff supervised by the primary care provider, including individualized plan of care and coordination with other care providers 2. 24/7 contact phone numbers for assistance for urgent and routine care needs. 3. Service will only be billed when office clinical staff spend 20 minutes or more in a month to coordinate care. 4. Only one practitioner may furnish and bill the service in a calendar month. 5.The patient may stop CCM services at any time (effective at the end of the month) by phone call to the office staff. 6. The patient will be responsible for cost sharing (co-pay) of up to 20% of the service fee (after annual deductible is met). Patient agreed to services and consent obtained.  Patient  Care Team: [name], NP as PCP - General (Family Medicine) [name], MD as PCP - Cardiology (Cardiology) [name], MD as Attending Physician (Urology) [name], [facility] as Pharmacist (Pharmacist)  Recent office visits: 01/22/21 [name] NP (PCP) - seen for diabetes mellitus. No medication changes. Come back after december.   11/12/20 [name], LPN: Patient presented for AWV.  Recent consult visits: 12/23/20 [name] MD ( Oncology) - presented to clinic for follow up on prostate cancer. No medication changes. Follow up in 4 months.   12/22/20 [name] MD ( Cardiology) - presented to clinic for follow up on heart failure and other chronic conditions. Refilled amoxicillin 541m for dental visits as needed. Follow up in 6 months.   09/08/20 CDarylene PriceMD (Cardiothoracic Surgery) - seen for follow up after aortic root replacement. No medication changes and no follow up noted.   08/28/20 JMichel BickersMD (Infectious Disease) - seen for aortic valve endocarditis. No medication changes. Follow up as noted.   08/26/20 FZola ButtonMD ( Oncology) - seen for follow up for prostate cancer. No medication changes. Follow up in 4 months.   08/07/20 JMichel BickersMD ( Infectious Disease ) -  Seen for aortic valve endocarditis. No medication changes. Follow up in 3 weeks.   07/28/20 SRichardson DoppPA-C( Cardiology ) - seen for heart failure and other chronic conditions. No medication changes. Follow up in 4 months.   Hospital visits: None in previous 6 months   Objective:  Lab Results  Component Value Date   CREATININE 1.08 01/20/2021   BUN 30 (  H) 01/20/2021   GFR 83.11 07/26/2019   GFRNONAA >60 12/23/2020   GFRAA 99 07/03/2020   NA 141 01/20/2021   K 4.1 01/20/2021   CALCIUM 9.0 01/20/2021   CO2 22 01/20/2021   GLUCOSE 92 01/20/2021    Lab Results  Component Value Date/Time   HGBA1C 6.0 (A) 01/22/2021 09:22 AM   HGBA1C 5.6 07/29/2020 08:31 AM    HGBA1C 6.8 (H) 05/02/2020 10:19 AM   HGBA1C 6.3 02/22/2020 09:29 AM   HGBA1C 6.2 10/23/2019 10:15 AM   GFR 83.11 07/26/2019 09:21 AM   GFR 95.90 05/24/2018 02:30 PM   MICROALBUR <0.7 04/28/2016 07:56 AM   MICROALBUR 0.8 11/02/2013 08:54 AM    Last diabetic Eye exam:  Lab Results  Component Value Date/Time   HMDIABEYEEXA borderline ocular HTN 12/03/2010 12:00 AM    Last diabetic Foot exam: No results found for: HMDIABFOOTEX   Lab Results  Component Value Date   CHOL 153 07/29/2020   HDL 48 07/29/2020   LDLCALC 82 07/29/2020   LDLDIRECT 105.0 04/28/2016   TRIG 129 07/29/2020   CHOLHDL 3.2 07/29/2020    Hepatic Function Latest Ref Rng & Units 12/23/2020 08/26/2020 07/29/2020  Total Protein 6.5 - 8.1 g/dL 6.1(L) 6.2(L) 6.1  Albumin 3.5 - 5.0 g/dL 3.6 3.5 -  AST 15 - 41 U/L 19 14(L) 13  ALT 0 - 44 U/L _0 Alk Phosphatase 38 - 126 U/L 61 65 -  Total Bilirubin 0.3 - 1.2 mg/dL 0.6 0.8 0.6  Bilirubin, Direct 0.0 - 0.3 mg/dL - - -    Lab Results  Component Value Date/Time   TSH 0.62 07/29/2020 08:31 AM   TSH 1.92 07/26/2019 09:21 AM    CBC Latest Ref Rng & Units 12/23/2020 12/22/2020 08/26/2020  WBC 4.0 - 10.5 K/uL 7.6 6.6 6.0  Hemoglobin 13.0 - 17.0 g/dL 10.8(L) 10.3(L) 9.9(L)  Hematocrit 39.0 - 52.0 % 33.7(L) 31.5(L) 30.4(L)  Platelets 150 - 400 K/uL 187 190 186    No results found for: VD25OH  Clinical ASCVD: Yes  The 10-year ASCVD risk score Mikey Bussing DC Jr., et al., 2013) is: 34.8%   Values used to calculate the score:     Age: 48 years     Sex: Male     Is Non-Hispanic African American: Yes     Diabetic: Yes     Tobacco smoker: No     Systolic Blood Pressure: 202 mmHg     Is BP treated: Yes     HDL Cholesterol: 48 mg/dL     Total Cholesterol: 153 mg/dL    Depression screen [facility] 2/9 11/12/2020 08/07/2020 07/29/2020  Decreased Interest 0 0 0  Down, Depressed, Hopeless 0 1 0  PHQ - 2 Score 0 1 0  Some recent data might be hidden       Social History   Tobacco Use   Smoking Status Former   Pack years: 0.00  Smokeless Tobacco Never  Tobacco Comments   quit 2005   BP Readings from Last 3 Encounters:  01/22/21 130/62  12/23/20 129/78  12/22/20 (!) 142/80   Pulse Readings from Last 3 Encounters:  01/22/21 73  12/23/20 65  12/22/20 63   Wt Readings from Last 3 Encounters:  01/22/21 183 lb 9.6 oz (83.3 kg)  12/23/20 189 lb 9.6 oz (86 kg)  12/22/20 190 lb 6.4 oz (86.4 kg)   BMI Readings from Last 3 Encounters:  01/22/21 27.92 kg/m  12/23/20 28.83 kg/m  12/22/20 28.95  kg/m    Assessment/Interventions: Review of patient past medical history, allergies, medications, health status, including review of consultants reports, laboratory and other test data, was performed as part of comprehensive evaluation and provision of chronic care management services.   SDOH:  (Social Determinants of Health) assessments and interventions performed: Yes SDOH Interventions    Flowsheet Row Most Recent Value  SDOH Interventions   Financial Strain Interventions Intervention Not Indicated  Transportation Interventions Intervention Not Indicated      SDOH Screenings   Alcohol Screen: Low Risk    Last Alcohol Screening Score (AUDIT): 1  Depression (PHQ2-9): Low Risk    PHQ-2 Score: 0  Financial Resource Strain: Low Risk    Difficulty of Paying Living Expenses: Not hard at all  Food Insecurity: No Food Insecurity   Worried About Charity fundraiser in the Last Year: Never true   Ran Out of Food in the Last Year: Never true  Housing: Low Risk    Last Housing Risk Score: 0  Physical Activity: Inactive   Days of Exercise per Week: 0 days   Minutes of Exercise per Session: 0 min  Social Connections: Socially Isolated   Frequency of Communication with Friends and Family: More than three times a week   Frequency of Social Gatherings with Friends and Family: More than three times a week   Attends Religious Services: Never   Marine scientist or  Organizations: No   Attends Archivist Meetings: Never   Marital Status: Widowed  Stress: No Stress Concern Present   Feeling of Stress : Not at all  Tobacco Use: Medium Risk   Smoking Tobacco Use: Former   Smokeless Tobacco Use: Never  Transportation Needs: No Data processing manager (Medical): No   Lack of Transportation (Non-Medical): No   Patient doesn't do a whole lot of working and he did some yard work yesterday. He lives alone and has a sister that lives one block away and a brother less than < 2 miles away. He gets to see them often.  Patient doesn't do a lot of exercise and wants to do more. Patient reports just a little bit of shortness of breath with exercise. Patient has a walkable neighborhood.  Patient mainly eats whatever he wants. He has cut back on eating a lot of bread. He eats grits and cereal for breakfast and 1 egg (a couple a week). Patient usually skips lunch and may eat out for dinner and sometimes at home. Patient eats fish and chicken more often than red meat. Patient usually has one vegetable with dinner (green beans, corn or cabbage). Patient eats "too many" sweets around 3-4 times a week (cakes or honeybuns). Patient drinks sugar free coke and light fruit juice and "not enough" water, which is usually 2-3 16 ounce bottles a day.  Patient doesn't sleep too well. He goes to sleep around 9pm and then wakes up at 1 am, is up til 4 am and then up at 6am. Patient feels pretty rested during the day and does take naps almost every day. Patient does not know if he snores right now. Patient has not done a sleep study but is not interested in one at this time.  Patient reports he thinks his potassium is now working as he was not taking this correctly before. Patient denies any side effects with his medications.   CCM Care Plan  No Known Allergies  Medications Reviewed Today  Reviewed by Rebecca Eaton, [place] (Certified Medical  Assistant) on 01/22/21 at [place] List Status: <None>   Medication Order Taking? Sig Documenting Provider Last Dose Status Informant  abiraterone acetate (ZYTIGA) 250 MG tablet [id number] Yes TAKE 4 TABLETS (1,000 MG TOTAL) BY MOUTH DAILY. TAKE ON AN EMPTY STOMACH 1 HOUR BEFORE OR 2 HOURS AFTER A MEAL Shadad, Mathis Dad, MD Taking Active   amoxicillin (AMOXIL) 500 MG tablet [id number] Yes Take 4 tablets (2,000 mg total) 1 hour prior to all dental visits. [name], MD Taking Active   aspirin EC 81 MG tablet [id number] Yes Take 81 mg by mouth daily. Swallow whole. [provider] Taking Active   atorvastatin (LIPITOR) 40 MG tablet [id number] Yes TAKE 1 TABLET EVERY DAY [name], Cory, NP Taking Active Self           Med Note Broadus John, REGEENA   Tue May 13, 2020  2:43 PM)    cholecalciferol (VITAMIN D3) 25 MCG (1000 UT) tablet [id number] Yes Take 1,000 Units by mouth daily. [provider] Taking Active Self  doxazosin (CARDURA) 8 MG tablet [id number] Yes TAKE 1 TABLET AT BEDTIME [name], [name], NP Taking Active   ferrous sulfate 325 (65 FE) MG EC tablet [id number] Yes Take 1 tablet (325 mg total) by mouth 2 (two) times daily before a meal. Willia Craze, NP Taking Active Self           Med Note Broadus John, REGEENA   Tue May 13, 2020  2:46 PM)    glipiZIDE (GLUCOTROL XL) 10 MG 24 hr tablet [id number] Yes TAKE 1 TABLET TWICE DAILY [name], [name], NP Taking Active Self  labetalol (NORMODYNE) 200 MG tablet [id number] Yes TAKE 3 TABLETS EVERY MORNING  AND TAKE 2 TABLETS EVERY EVENING [name], Cory, NP Taking Active   levothyroxine (SYNTHROID) 150 MCG tablet [id number] Yes TAKE 1 TABLET EVERY DAY [name], [name], NP Taking Active   metFORMIN (GLUCOPHAGE) 1000 MG tablet [id number] Yes Take 1 tablet (1,000 mg total) by mouth 2 (two) times daily with a meal. OFFICE VISIT NEEDED FOR ADDITIONAL REFILLS [name], [name], NP Taking Active   omeprazole (PRILOSEC) 40 MG capsule [id number] Yes TAKE 1  CAPSULE BY MOUTH EVERY DAY Willia Craze, NP Taking Active   potassium chloride SA (KLOR-CON) 20 MEQ tablet [id number] Yes Take 2 tablets (40 mEq total) by mouth 2 (two) times daily. Richardson Dopp T, PA-C Taking Active   predniSONE (DELTASONE) 5 MG tablet [id number] Yes TAKE 1 TABLET BY MOUTH ONCE A DAY WITH BREAKFAST Wyatt Portela, MD Taking Active   torsemide (DEMADEX) 20 MG tablet [id number]  Take 1 tablet (20 mg total) by mouth 2 (two) times daily. 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   Streptococcal bacteremia 05/13/2020   Acute CHF (congestive heart failure) ([place]) 05/13/2020   Respiratory distress 05/13/2020   Aortic valve endocarditis 05/13/2020   Severe aortic insufficiency 05/13/2020   Acute blood loss anemia 05/02/2020   Malignant neoplasm of prostate ([place]) 06/23/2018   Goals of  care, counseling/discussion 06/23/2018   Aortic stenosis    Left inguinal hernia 09/04/2015   Alcoholism ([place]) 06/10/2014   Anemia of chronic disease 05/23/2014   Elevated PSA 01/20/2012   HEMORRHAGE OF RECTUM AND ANUS 12/15/2008   ERECTILE DYSFUNCTION, MILD 12/14/2007   SEBORRHEIC KERATOSIS, INFLAMED 08/03/2007   NEOPLASM, SKIN, UNCERTAIN BEHAVIOR 49/70/2637   Hypothyroidism 07/06/2007   Diabetes mellitus ([place]) 07/06/2007   Hyperlipidemia 07/06/2007   Essential hypertension 07/06/2007    Immunization History  Administered Date(s) Administered   Fluad Quad(high Dose 65+) 07/26/2019, 07/29/2020   Influenza Split 07/12/2011   Influenza Whole 05/23/2008,  06/13/2009, 05/11/2010   Influenza, High Dose Seasonal PF 05/03/2016, 05/24/2018   Influenza,inj,Quad PF,6+ Mos 05/16/2014   PFIZER(Purple Top)SARS-COV-2 Vaccination 10/21/2019, 11/20/2019, 07/25/2020   Pneumococcal Conjugate-13 11/13/2013   Pneumococcal Polysaccharide-23 06/02/2000, 12/24/2004, 05/03/2016   Td 01/05/1996, 01/12/2007    Conditions to be addressed/monitored:  Hypertension, Hyperlipidemia, Diabetes, Heart Failure, GERD, Hypothyroidism, and ED and cancer  Care Plan : [place]  Updates made by [name], [place] since 02/19/2021 12:00 AM     Problem: Problem: Hypertension, Hyperlipidemia, Diabetes, Heart Failure, GERD, Hypothyroidism, and ED and cancer      Long-Range Goal: Patient-Specific Goal   Start Date: 01/29/2021  Expected End Date: 01/29/2022  This Visit's Progress: On track  Priority: High  Note:   Current Barriers:  Unable to independently monitor therapeutic efficacy Unable to achieve control of cholesterol  Suboptimal therapeutic regimen for blood pressure  Pharmacist Clinical Goal(s):  Patient will achieve adherence to monitoring guidelines and medication adherence to achieve therapeutic efficacy achieve control of cholesterol as evidenced by next lipid panel  through collaboration with PharmD and provider.   Interventions: 1:1 collaboration with [name], NP regarding development and update of comprehensive plan of care as evidenced by provider attestation and co-signature Inter-disciplinary care team collaboration (see longitudinal plan of care) Comprehensive medication review performed; medication list updated in electronic medical record  Hypertension (BP goal <140/90) -Not ideally controlled -Current treatment: Doxazosin 8 mg 1 tablet at bedtime Labetalol 200 mg 3 tablets every morning and 2 tablets at bedtime -Medications previously tried: none  -Current home readings: 136/79, 120/69 - checking once a week  -Current  dietary habits: doesn't add any salt to his food; doesn't look at package labels -Current exercise habits: does not exercise routinely -Denies hypotensive/hypertensive symptoms -Educated on Importance of home blood pressure monitoring; Proper BP monitoring technique; Symptoms of hypotension and importance of maintaining adequate hydration; -Counseled to monitor BP at home weekly, document, and provide log at future appointments -Counseled on diet and exercise extensively Recommended to continue current medication Collaborated with PCP about changing blood pressure regimen.  Hyperlipidemia: (LDL goal < 70) -Uncontrolled -Current treatment: Atorvastatin 40 mg 1 tablet daily -Medications previously tried: none  -Current dietary patterns: mostly eating baked or boiled foods; cooks with vegetable oil and recommended olive or canola -Current exercise habits: no structured exercise -Educated on Cholesterol goals;  Benefits of statin for ASCVD risk reduction; Importance of limiting foods high in cholesterol; Exercise goal of 150 minutes per week; -Counseled on diet and exercise extensively Recommended to increase statin therapy based on ASCVD risk  Diabetes (A1c goal <7%) -Controlled -Current medications: Glipizide 10 mg 1 tablet twice daily - once a day Metformin 1000 mg 1 tablet twice daily -Medications previously tried: none  -Current home glucose readings fasting glucose: 80, 110 (twice a week) post prandial glucose: 140-180 -Denies hypoglycemic/hyperglycemic symptoms -Current meal patterns:  breakfast: grits or cereal and 1 egg lunch: n/a  dinner: n/a snacks: n/a drinks: n/a -Current exercise: no structured exercise -Educated on A1c and blood sugar goals; Exercise goal of 150 minutes per week; Carbohydrate counting and/or plate method Counseled to check feet daily and get yearly eye exams -Counseled to check feet daily and get yearly eye exams -Counseled on diet and  exercise extensively Recommended to continue current medication Patient requested referral for new eye doctor.  CAD (Goal: prevent heart events) -Controlled -Current treatment  Aspirin 81 mg 1 tablet daily Atorvastatin 40 mg 1 tablet daily -Medications previously tried: none  -Counseled on limiting NSAID use with aspirin Educated on monitoring for signs of bleeding such as unexplained and excessive bleeding from a cut or injury, easy or excessive bruising, blood in urine or stools, and nosebleeds without a known cause  Heart Failure (Goal: manage symptoms and prevent exacerbations) -Controlled -Last ejection fraction: 55-60% (Date: 12/24/20) -HF type: Diastolic -NYHA Class: II (slight limitation of activity) -AHA HF Stage: C (Heart disease and symptoms present) -Current treatment: Torsemide 20 mg 1 tablet twice daily Potassium chloride 20 mEq 2 tablets by mouth twice daily -Medications previously tried: none  -Current home BP/HR readings: refer to above -Current dietary habits: refer to above -Current exercise habits: limited activity -Educated on Importance of weighing daily; if you gain more than 3 pounds in one day or 5 pounds in one week, call cardiologist Proper diuretic administration and potassium supplementation Importance of blood pressure control -Counseled on diet and exercise extensively Recommended to continue current medication  Anemia (Goal: ferritin 24-380) -Controlled -Current treatment  Ferrous sulfate 325 mg 1 tablet twice daily -Medications previously tried: none  -Recommended to continue current medication  Hypothyroidism (Goal: 0.35-4.5) -Controlled -Current treatment  Levothyroxine 150 mcg 1 tablet daily -Medications previously tried: none  -Counseled on importance of taking on an empty stomach and separating from iron tablet by 4 hours or more  Prostate cancer (Goal: remission) -Controlled -Current treatment  Zytiga 250 mg 4 tablets  daily Prednisone 5 mg 1 tablet daily -Medications previously tried: none  -Recommended to continue current medication Patient acquires through patient assistance program.  Stomach protection (Goal: prevent stomach bleeds) -Controlled -Current treatment  Omeprazole 40 mg 1 capsule every day -Medications previously tried: none  -Educated on use for protection of stomach. Will reassess the need for it at follow up.   Health Maintenance -Vaccine gaps: shingrix, COVID booster -Current therapy:  Vitamin D3 1000 units daily Amoxicillin 500 mg 4 tablets prior to dental visits -Educated on Cost vs benefit of each product must be carefully weighed by individual consumer -Patient is satisfied with current therapy and denies issues -Recommended to continue current medication  Patient Goals/Self-Care Activities Patient will:  - take medications as prescribed check glucose a few times a week, document, and provide at future appointments check blood pressure weekly, document, and provide at future appointments target a minimum of 150 minutes of moderate intensity exercise weekly  Follow Up Plan: Telephone follow up appointment with care management team member scheduled for: 3 months      Medication Assistance: None required.  Patient affirms current coverage meets needs.  Compliance/Adherence/Medication fill history: Care Gaps: Shingrix, eye exam, COVID booster  Star-Rating Drugs: Atorvastatin 79m - [facility] on [facility] - [address], [address] [place] [address] Phone: [phone] Fax: [phone]  [id number] [place]Phone:  [phone]: [phone] [place]Phone: [phone]: [phone] Uses pill box? Yes - weekly (one time slot) - early morning he doesn't put in a pillbox (3 days at a time) Pt endorses 99% compliance   We discussed: Current pharmacy is preferred with insurance plan and patient is satisfied with pharmacy services Patient decided to: Continue current medication management strategy  Care Plan and Follow Up Patient Decision:  Patient agrees to Care Plan and Follow-up.  Plan: Telephone follow up appointment with care management team member scheduled for:  3 months  MJeni Salles PharmD, BOssunPharmacist LSmartsvilleat [phone]

## 2021-02-17 ENCOUNTER — Other Ambulatory Visit: Payer: Self-pay | Admitting: Oncology

## 2021-02-17 ENCOUNTER — Other Ambulatory Visit (HOSPITAL_COMMUNITY): Payer: Self-pay

## 2021-02-17 ENCOUNTER — Other Ambulatory Visit: Payer: Self-pay | Admitting: *Deleted

## 2021-02-17 DIAGNOSIS — C61 Malignant neoplasm of prostate: Secondary | ICD-10-CM

## 2021-02-18 ENCOUNTER — Other Ambulatory Visit (HOSPITAL_COMMUNITY): Payer: Self-pay

## 2021-02-19 ENCOUNTER — Other Ambulatory Visit: Payer: Self-pay | Admitting: *Deleted

## 2021-02-19 ENCOUNTER — Other Ambulatory Visit (HOSPITAL_COMMUNITY): Payer: Self-pay

## 2021-02-19 DIAGNOSIS — C61 Malignant neoplasm of prostate: Secondary | ICD-10-CM

## 2021-02-19 MED ORDER — ABIRATERONE ACETATE 250 MG PO TABS
ORAL_TABLET | Freq: Every day | ORAL | 0 refills | Status: DC
  Filled 2021-02-19: qty 120, 30d supply, fill #0

## 2021-02-19 NOTE — Patient Instructions (Signed)
Hi Olvin,  It was great to get to meet you over the telephone! Below is a summary of some of the topics we discussed.   Please reach out to me if you have any questions or need anything before our follow up!  Best, Maddie  Jeni Salles, PharmD, [facility]   Visit Information   Goals Addressed             This Visit's Progress    Manage My Medicine       Timeframe:  Short-Term Goal Priority:  High Start Date:                             Expected End Date:                       Follow Up Date 05/07/21    - call for medicine refill 2 or 3 days before it runs out - keep a list of all the medicines I take; vitamins and herbals too - use a pillbox to sort medicine    Why is this important?   These steps will help you keep on track with your medicines.   Notes:          Patient Care Plan: CCM Pharmacy Care Plan     Problem Identified: Problem: Hypertension, Hyperlipidemia, Diabetes, Heart Failure, GERD, Hypothyroidism, and ED and cancer      Long-Range Goal: Patient-Specific Goal   Start Date: 01/29/2021  Expected End Date: 01/29/2022  This Visit's Progress: On track  Priority: High  Note:   Current Barriers:  Unable to independently monitor therapeutic efficacy Unable to achieve control of cholesterol  Suboptimal therapeutic regimen for blood pressure  Pharmacist Clinical Goal(s):  Patient will achieve adherence to monitoring guidelines and medication adherence to achieve therapeutic efficacy achieve control of cholesterol as evidenced by next lipid panel  through collaboration with PharmD and provider.   Interventions: 1:1 collaboration with [name], NP regarding development and update of comprehensive plan of care as evidenced by provider attestation and co-signature Inter-disciplinary care team collaboration (see longitudinal plan of care) Comprehensive medication review performed;  medication list updated in electronic medical record  Hypertension (BP goal <140/90) -Not ideally controlled -Current treatment: Doxazosin 8 mg 1 tablet at bedtime Labetalol 200 mg 3 tablets every morning and 2 tablets at bedtime -Medications previously tried: none  -Current home readings: 136/79, 120/69 - checking once a week  -Current dietary habits: doesn't add any salt to his food; doesn't look at package labels -Current exercise habits: does not exercise routinely -Denies hypotensive/hypertensive symptoms -Educated on Importance of home blood pressure monitoring; Proper BP monitoring technique; Symptoms of hypotension and importance of maintaining adequate hydration; -Counseled to monitor BP at home weekly, document, and provide log at future appointments -Counseled on diet and exercise extensively Recommended to continue current medication Collaborated with PCP about changing blood pressure regimen.  Hyperlipidemia: (LDL goal < 70) -Uncontrolled -Current treatment: Atorvastatin 40 mg 1 tablet daily -Medications previously tried: none  -Current dietary patterns: mostly eating baked or boiled foods; cooks with vegetable oil and recommended olive or canola -Current exercise habits: no structured exercise -Educated on Cholesterol goals;  Benefits of statin for ASCVD risk reduction; Importance of limiting foods high in cholesterol; Exercise goal of 150 minutes per week; -Counseled on diet and exercise extensively Recommended to increase statin therapy based on ASCVD risk  Diabetes (A1c goal <7%) -Controlled -Current medications: Glipizide 10 mg 1 tablet twice daily - once a day Metformin 1000 mg 1 tablet twice daily -Medications previously tried: none  -Current home glucose readings fasting glucose: 80, 110 (twice a week) post prandial glucose: 140-180 -Denies hypoglycemic/hyperglycemic symptoms -Current meal patterns:  breakfast: grits or cereal and 1 egg lunch: n/a   dinner: n/a snacks: n/a drinks: n/a -Current exercise: no structured exercise -Educated on A1c and blood sugar goals; Exercise goal of 150 minutes per week; Carbohydrate counting and/or plate method Counseled to check feet daily and get yearly eye exams -Counseled to check feet daily and get yearly eye exams -Counseled on diet and exercise extensively Recommended to continue current medication Patient requested referral for new eye doctor.  CAD (Goal: prevent heart events) -Controlled -Current treatment  Aspirin 81 mg 1 tablet daily Atorvastatin 40 mg 1 tablet daily -Medications previously tried: none  -Counseled on limiting NSAID use with aspirin Educated on monitoring for signs of bleeding such as unexplained and excessive bleeding from a cut or injury, easy or excessive bruising, blood in urine or stools, and nosebleeds without a known cause  Heart Failure (Goal: manage symptoms and prevent exacerbations) -Controlled -Last ejection fraction: 55-60% (Date: 12/24/20) -HF type: Diastolic -NYHA Class: II (slight limitation of activity) -AHA HF Stage: C (Heart disease and symptoms present) -Current treatment: Torsemide 20 mg 1 tablet twice daily Potassium chloride 20 mEq 2 tablets by mouth twice daily -Medications previously tried: none  -Current home BP/HR readings: refer to above -Current dietary habits: refer to above -Current exercise habits: limited activity -Educated on Importance of weighing daily; if you gain more than 3 pounds in one day or 5 pounds in one week, call cardiologist Proper diuretic administration and potassium supplementation Importance of blood pressure control -Counseled on diet and exercise extensively Recommended to continue current medication  Anemia (Goal: ferritin 24-380) -Controlled -Current treatment  Ferrous sulfate 325 mg 1 tablet twice daily -Medications previously tried: none  -Recommended to continue current medication  Hypothyroidism  (Goal: 0.35-4.5) -Controlled -Current treatment  Levothyroxine 150 mcg 1 tablet daily -Medications previously tried: none  -Counseled on importance of taking on an empty stomach and separating from iron tablet by 4 hours or more  Prostate cancer (Goal: remission) -Controlled -Current treatment  Zytiga 250 mg 4 tablets daily Prednisone 5 mg 1 tablet daily -Medications previously tried: none  -Recommended to continue current medication Patient acquires through patient assistance program.  Stomach protection (Goal: prevent stomach bleeds) -Controlled -Current treatment  Omeprazole 40 mg 1 capsule every day -Medications previously tried: none  -Educated on use for protection of stomach. Will reassess the need for it at follow up.   Health Maintenance -Vaccine gaps: shingrix, COVID booster -Current therapy:  Vitamin D3 1000 units daily Amoxicillin 500 mg 4 tablets prior to dental visits -Educated on Cost vs benefit of each product must be carefully weighed by individual consumer -Patient is satisfied with current therapy and denies issues -Recommended to continue current medication  Patient Goals/Self-Care Activities Patient will:  - take medications as prescribed check glucose a few times a week, document, and provide at future appointments check blood pressure weekly, document, and provide at future appointments target a minimum of 150 minutes of moderate intensity exercise weekly  Follow Up Plan: Telephone follow up appointment with care management team member scheduled for: 3 months      Mr. Rossa was given information about Chronic Care Management services today including:  CCM  service includes personalized support from designated clinical staff supervised by his physician, including individualized plan of care and coordination with other care providers 24/7 contact phone numbers for assistance for urgent and routine care needs. Standard insurance, coinsurance, copays  and deductibles apply for chronic care management only during months in which we provide at least 20 minutes of these services. Most insurances cover these services at 100%, however patients may be responsible for any copay, coinsurance and/or deductible if applicable. This service may help you avoid the need for more expensive face-to-face services. Only one practitioner may furnish and bill the service in a calendar month. The patient may stop CCM services at any time (effective at the end of the month) by phone call to the office staff.  Patient agreed to services and verbal consent obtained.   The patient verbalized understanding of instructions, educational materials, and care plan provided today and agreed to receive a mailed copy of patient instructions, educational materials, and care plan.  Telephone follow up appointment with pharmacy team member scheduled for: 3 months  [name], [facility]

## 2021-02-20 ENCOUNTER — Other Ambulatory Visit: Payer: Self-pay | Admitting: Adult Health

## 2021-02-20 DIAGNOSIS — E782 Mixed hyperlipidemia: Secondary | ICD-10-CM

## 2021-02-20 DIAGNOSIS — E119 Type 2 diabetes mellitus without complications: Secondary | ICD-10-CM

## 2021-02-20 MED ORDER — ATORVASTATIN CALCIUM 80 MG PO TABS: 80.0000 mg | ORAL_TABLET | Freq: Every day | ORAL | 2 refills | Status: DC

## 2021-02-20 MED ORDER — GLIPIZIDE ER 10 MG PO TB24: 10.0000 mg | ORAL_TABLET | Freq: Every day | ORAL | 0 refills | Status: DC

## 2021-02-24 ENCOUNTER — Telehealth: Payer: Self-pay | Admitting: Pharmacist

## 2021-02-24 NOTE — Telephone Encounter (Signed)
Called patient to discuss changes from CCM visit after discussion with PCP.  Patient is aware to double up on atorvastatin 40 mg tablets and that a new prescription for 80 mg tablets was sent into the pharmacy. He will use the 40 mg tablets until he runs out and then will transition to the 80 mg tablets.  Patient is also aware a new rx was sent in for glipizide to reflect that he is only taking once daily.  Plan to follow up in 3 weeks for tolerance assessment of increase in dose.

## 2021-02-25 ENCOUNTER — Other Ambulatory Visit: Payer: Self-pay

## 2021-02-25 DIAGNOSIS — E119 Type 2 diabetes mellitus without complications: Secondary | ICD-10-CM

## 2021-02-25 MED ORDER — GLIPIZIDE ER 10 MG PO TB24: ORAL_TABLET | ORAL | 0 refills | Status: DC

## 2021-02-25 NOTE — Telephone Encounter (Signed)
Glipizide Rx sent in with 2 sig. Resending Rx with the correct sig.

## 2021-03-05 ENCOUNTER — Encounter: Payer: Self-pay | Admitting: Gastroenterology

## 2021-03-18 ENCOUNTER — Telehealth: Payer: Self-pay | Admitting: Pharmacist

## 2021-03-18 NOTE — Chronic Care Management (AMB) (Signed)
Chronic Care Management Pharmacy Assistant   Name: Matthew Serrano.  MRN: [id number] DOB: [birth year]   Reason for Encounter: Disease State/ Hyperlipidemia Assessment Call.    Conditions to be addressed/monitored: HLD  Recent office visits:  None.   Recent consult visits:  None.   Hospital visits:  None in previous 6 months  Medications: Outpatient Encounter Medications as of 03/18/2021  Medication Sig   abiraterone acetate (ZYTIGA) 250 MG tablet TAKE 4 TABLETS (1,000 MG TOTAL) BY MOUTH DAILY. TAKE ON AN EMPTY STOMACH 1 HOUR BEFORE OR 2 HOURS AFTER A MEAL   amoxicillin (AMOXIL) 500 MG tablet Take 4 tablets (2,000 mg total) 1 hour prior to all dental visits.   aspirin EC 81 MG tablet Take 81 mg by mouth daily. Swallow whole.   atorvastatin (LIPITOR) 80 MG tablet Take 1 tablet (80 mg total) by mouth daily.   cholecalciferol (VITAMIN D3) 25 MCG (1000 UT) tablet Take 1,000 Units by mouth daily.   doxazosin (CARDURA) 8 MG tablet TAKE 1 TABLET AT BEDTIME   ferrous sulfate 325 (65 FE) MG EC tablet Take 1 tablet (325 mg total) by mouth 2 (two) times daily before a meal.   glipiZIDE (GLUCOTROL XL) 10 MG 24 hr tablet TAKE 1 TABLET TWICE DAILY   labetalol (NORMODYNE) 200 MG tablet TAKE 3 TABLETS EVERY MORNING  AND TAKE 2 TABLETS EVERY EVENING   levothyroxine (SYNTHROID) 150 MCG tablet TAKE 1 TABLET EVERY DAY   metFORMIN (GLUCOPHAGE) 1000 MG tablet TAKE 1 TABLET TWICE DAILY WITH MEALS (NEED MD APPOINTMENT FOR REFILLS)   omeprazole (PRILOSEC) 40 MG capsule TAKE 1 CAPSULE BY MOUTH EVERY DAY   potassium chloride SA (KLOR-CON) 20 MEQ tablet Take 2 tablets (40 mEq total) by mouth 2 (two) times daily.   predniSONE (DELTASONE) 5 MG tablet TAKE 1 TABLET BY MOUTH ONCE A DAY WITH BREAKFAST   torsemide (DEMADEX) 20 MG tablet Take 1 tablet (20 mg total) by mouth 2 (two) times daily.   No facility-administered encounter medications on file as of 03/18/2021.   Comprehensive medication review  performed; Spoke to patient regarding cholesterol  Lipid Panel    Component Value Date/Time   CHOL 153 07/29/2020 0831   TRIG 129 07/29/2020 0831   HDL 48 07/29/2020 0831   LDLCALC 82 07/29/2020 0831   LDLDIRECT 105.0 04/28/2016 0756    10-year ASCVD risk score: The 10-year ASCVD risk score Mikey Bussing DC Brooke Bonito., et al., 2013) is: 34.8%   Values used to calculate the score:     Age: 75 years     Sex: Male     Is Non-Hispanic African American: Yes     Diabetic: Yes     Tobacco smoker: No     Systolic Blood Pressure: [id number]     Is BP treated: Yes     HDL Cholesterol: 48 mg/dL     Total Cholesterol: 153 mg/dL  Current antihyperlipidemic regimen:  Atorvastatin '80mg'$  - take 1 tablet by mouth daily.  Previous antihyperlipidemic medications tried: None.  ASCVD risk enhancing conditions: age >2, DM, and HTN What recent interventions/DTPs have been made by any provider to improve Cholesterol control since last CPP Visit: increased atorvastatin from '40mg'$  to '80mg'$ .  Any recent hospitalizations or ED visits since last visit with CPP? No  Adherence Review: Does the patient have >5 day gap between last estimated fill dates? No  Notes: Spoke with patient and reviewed all medications as listed. Patient reports taking all medications as prescribed  and has been taking his recently increased dose of atorvastatin to now '80mg'$  daily. Patient reports he is doing fine with the increased and no issues or side effects like muscle aches or cramps. Patient states for breakfast he tends to have a bowl of cereal or 2 eggs and some grits with a cup of coffee and juice. For lunch patient states he usually has a [place], lettuce and tomato sandwich with some fries at home and a sugar free coke. Patient stated he usually eats out for dinner fast food about twice a week and has a hamburger and fries with a sugar free coke and if he eats at home its usually something like baked chicken, rice and green beans and no bread.  Patient does not add salt to any of his food but he does have red meat quite a bit. Patient stated he drinks around 5 glasses of water a day. For activity patient states he walks around 4-5 blocks everyday dn he does yard work weekly. Patient thanked me for my call.   Care Gaps:  AWV - scheduled for 11/13/21 Zoster vaccines [facility]) - never done Ophthalmology exam - overdue since 09/25/14 Covid-19 vaccine - booster 4 overdue since 10/23/20  Star Rating Drugs:  Atorvastatin '80mg'$  - last filled on 04/16/20 90DS at [facility] '10mg'$  - last filled on 01/24/20 [facility] at [facility] Metformin '1000mg'$  - last filled on 08/25/20 [place] Pharmacist Assistant [phone]

## 2021-03-19 ENCOUNTER — Other Ambulatory Visit: Payer: Self-pay | Admitting: Oncology

## 2021-03-19 ENCOUNTER — Other Ambulatory Visit (HOSPITAL_COMMUNITY): Payer: Self-pay

## 2021-03-19 DIAGNOSIS — C61 Malignant neoplasm of prostate: Secondary | ICD-10-CM

## 2021-03-19 MED ORDER — ABIRATERONE ACETATE 250 MG PO TABS
ORAL_TABLET | Freq: Every day | ORAL | 0 refills | Status: DC
  Filled 2021-03-19 – 2021-03-23 (×2): qty 120, 30d supply, fill #0

## 2021-03-23 ENCOUNTER — Encounter (HOSPITAL_COMMUNITY): Payer: Self-pay | Admitting: Internal Medicine

## 2021-03-23 ENCOUNTER — Other Ambulatory Visit (HOSPITAL_COMMUNITY): Payer: Self-pay

## 2021-03-23 ENCOUNTER — Encounter: Payer: Self-pay | Admitting: Oncology

## 2021-04-17 ENCOUNTER — Other Ambulatory Visit: Payer: Self-pay | Admitting: Oncology

## 2021-04-17 ENCOUNTER — Other Ambulatory Visit (HOSPITAL_COMMUNITY): Payer: Self-pay

## 2021-04-17 DIAGNOSIS — C61 Malignant neoplasm of prostate: Secondary | ICD-10-CM

## 2021-04-17 MED ORDER — ABIRATERONE ACETATE 250 MG PO TABS
ORAL_TABLET | Freq: Every day | ORAL | 0 refills | Status: DC
  Filled 2021-04-17: qty 120, 30d supply, fill #0

## 2021-04-21 ENCOUNTER — Encounter: Payer: Self-pay | Admitting: Oncology

## 2021-04-21 ENCOUNTER — Other Ambulatory Visit (HOSPITAL_COMMUNITY): Payer: Self-pay

## 2021-04-21 ENCOUNTER — Encounter (HOSPITAL_COMMUNITY): Payer: Self-pay | Admitting: Internal Medicine

## 2021-04-23 ENCOUNTER — Other Ambulatory Visit (HOSPITAL_COMMUNITY): Payer: Self-pay

## 2021-04-28 ENCOUNTER — Inpatient Hospital Stay: Payer: Medicare Other

## 2021-04-28 ENCOUNTER — Inpatient Hospital Stay: Payer: Medicare Other | Attending: Oncology

## 2021-04-28 ENCOUNTER — Other Ambulatory Visit: Payer: Self-pay

## 2021-04-28 ENCOUNTER — Inpatient Hospital Stay (HOSPITAL_BASED_OUTPATIENT_CLINIC_OR_DEPARTMENT_OTHER): Payer: Medicare Other | Admitting: Oncology

## 2021-04-28 VITALS — BP 122/62 | HR 68 | Temp 97.7°F | Resp 15 | Ht 68.0 in | Wt 188.0 lb

## 2021-04-28 DIAGNOSIS — Z7952 Long term (current) use of systemic steroids: Secondary | ICD-10-CM | POA: Insufficient documentation

## 2021-04-28 DIAGNOSIS — C61 Malignant neoplasm of prostate: Secondary | ICD-10-CM | POA: Diagnosis not present

## 2021-04-28 DIAGNOSIS — Z79899 Other long term (current) drug therapy: Secondary | ICD-10-CM | POA: Insufficient documentation

## 2021-04-28 DIAGNOSIS — E876 Hypokalemia: Secondary | ICD-10-CM | POA: Insufficient documentation

## 2021-04-28 DIAGNOSIS — R591 Generalized enlarged lymph nodes: Secondary | ICD-10-CM | POA: Diagnosis not present

## 2021-04-28 LAB — CBC WITH DIFFERENTIAL (CANCER CENTER ONLY)
Abs Immature Granulocytes: 0.02 10*3/uL (ref 0.00–0.07)
Basophils Absolute: 0 10*3/uL (ref 0.0–0.1)
Basophils Relative: 1 %
Eosinophils Absolute: 0.1 10*3/uL (ref 0.0–0.5)
Eosinophils Relative: 2 %
HCT: 32.7 % — ABNORMAL LOW (ref 39.0–52.0)
Hemoglobin: 10.4 g/dL — ABNORMAL LOW (ref 13.0–17.0)
Immature Granulocytes: 0 %
Lymphocytes Relative: 24 %
Lymphs Abs: 1.6 10*3/uL (ref 0.7–4.0)
MCH: 27.6 pg (ref 26.0–34.0)
MCHC: 31.8 g/dL (ref 30.0–36.0)
MCV: 86.7 fL (ref 80.0–100.0)
Monocytes Absolute: 0.4 10*3/uL (ref 0.1–1.0)
Monocytes Relative: 6 %
Neutro Abs: 4.6 10*3/uL (ref 1.7–7.7)
Neutrophils Relative %: 67 %
Platelet Count: 179 10*3/uL (ref 150–400)
RBC: 3.77 MIL/uL — ABNORMAL LOW (ref 4.22–5.81)
RDW: 14.4 % (ref 11.5–15.5)
WBC Count: 6.8 10*3/uL (ref 4.0–10.5)
nRBC: 0 % (ref 0.0–0.2)

## 2021-04-28 LAB — CMP (CANCER CENTER ONLY)
ALT: 27 U/L (ref 0–44)
AST: 22 U/L (ref 15–41)
Albumin: 3.7 g/dL (ref 3.5–5.0)
Alkaline Phosphatase: 65 U/L (ref 38–126)
Anion gap: 11 (ref 5–15)
BUN: 21 mg/dL (ref 8–23)
CO2: 26 mmol/L (ref 22–32)
Calcium: 9.3 mg/dL (ref 8.9–10.3)
Chloride: 108 mmol/L (ref 98–111)
Creatinine: 1.15 mg/dL (ref 0.61–1.24)
GFR, Estimated: >60 mL/min (ref >60)
Glucose, Bld: 153 mg/dL — ABNORMAL HIGH (ref 70–99)
Potassium: 4.1 mmol/L (ref 3.5–5.1)
Sodium: 145 mmol/L (ref 135–145)
Total Bilirubin: 0.7 mg/dL (ref 0.3–1.2)
Total Protein: 5.6 g/dL — ABNORMAL LOW (ref 6.5–8.1)

## 2021-04-28 MED ORDER — LEUPROLIDE ACETATE (4 MONTH) 30 MG ~~LOC~~ KIT
30.0000 mg | PACK | Freq: Once | SUBCUTANEOUS | Status: AC
  Administered 2021-04-28: 30 mg via SUBCUTANEOUS
  Filled 2021-04-28: qty 30

## 2021-04-28 NOTE — Progress Notes (Signed)
Hematology and Oncology Follow Up Visit  Karina Zeisloft [id number] 05-04-1946 75 y.o. 04/28/2021 9:52 AM [name], [name], [name], NP   Principle Diagnosis: 75 year old man with advanced prostate cancer with lymphadenopathy diagnosed in 2019.  He developed castration-resistant after presenting with PSA of 1170 and a Gleason score 4+5 = 9.   Prior Therapy:  Status post prostate biopsy obtained on 06/06/2018 which showed high-volume disease with a Gleason score 4+5 = 9.  Taxotere chemotherapy at 75 mg/m every 3 weeks started on 07/07/2018.  He he is here for cycle 6 of therapy.  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  Current therapy:   Eligard 30 mg every 4 months.  He received Eligard today and repeated in 4 months.  Zytiga 1000 mg daily started in April 2020.  Interim History: Mr. Finken is here for return evaluation.  Since last visit, he reports feeling well without any major complaints.  He continues to tolerate Zytiga without any complaints.  He denies any nausea, vomiting or abdominal pain.  He denies any excessive fatigue or tiredness.  He denies any recent hospitalizations or illnesses.                    Medications: Unchanged on review. Current Outpatient Medications  Medication Sig Dispense Refill   abiraterone acetate (ZYTIGA) 250 MG tablet TAKE 4 TABLETS (1,000 MG TOTAL) BY MOUTH DAILY. TAKE ON AN EMPTY STOMACH 1 HOUR BEFORE OR 2 HOURS AFTER A MEAL 120 tablet 0   amoxicillin (AMOXIL) 500 MG tablet Take 4 tablets (2,000 mg total) 1 hour prior to all dental visits. 12 tablet 12   aspirin EC 81 MG tablet Take 81 mg by mouth daily. Swallow whole.     atorvastatin (LIPITOR) 80 MG tablet Take 1 tablet (80 mg total) by mouth daily. 90 tablet 2   cholecalciferol (VITAMIN D3) 25 MCG (1000 UT) tablet Take 1,000 Units by mouth daily.     doxazosin (CARDURA) 8 MG tablet TAKE 1 TABLET AT BEDTIME 90  tablet 3   ferrous sulfate 325 (65 FE) MG EC tablet Take 1 tablet (325 mg total) by mouth 2 (two) times daily before a meal. 60 tablet 2   glipiZIDE (GLUCOTROL XL) 10 MG 24 hr tablet TAKE 1 TABLET TWICE DAILY 90 tablet 0   labetalol (NORMODYNE) 200 MG tablet TAKE 3 TABLETS EVERY MORNING  AND TAKE 2 TABLETS EVERY EVENING 450 tablet 3   levothyroxine (SYNTHROID) 150 MCG tablet TAKE 1 TABLET EVERY DAY 90 tablet 0   metFORMIN (GLUCOPHAGE) 1000 MG tablet TAKE 1 TABLET TWICE DAILY WITH MEALS (NEED MD APPOINTMENT FOR REFILLS) 180 tablet 0   omeprazole (PRILOSEC) 40 MG capsule TAKE 1 CAPSULE BY MOUTH EVERY DAY 90 capsule 1   potassium chloride SA (KLOR-CON) 20 MEQ tablet Take 2 tablets (40 mEq total) by mouth 2 (two) times daily. 360 tablet 3   predniSONE (DELTASONE) 5 MG tablet TAKE 1 TABLET BY MOUTH ONCE A DAY WITH BREAKFAST 90 tablet 3   torsemide (DEMADEX) 20 MG tablet Take 1 tablet (20 mg total) by mouth 2 (two) times daily. 180 tablet 3   No current facility-administered medications for this visit.     Allergies: No Known Allergies      Physical Exam:    Blood pressure 122/62, pulse 68, temperature 97.7 F (36.5 C), temperature source Oral, resp. rate 15, height '5\' 8"'$  (1.727 m), weight 188 lb (85.3  kg), SpO2 100 %.        ECOG: 0   General appearance: Alert, awake without any distress. Head: Atraumatic without abnormalities Oropharynx: Without any thrush or ulcers. Eyes: No scleral icterus. Lymph nodes: No lymphadenopathy noted in the cervical, supraclavicular, or axillary nodes Heart:regular rate and rhythm, without any murmurs or gallops.   Lung: Clear to auscultation without any rhonchi, wheezes or dullness to percussion. Abdomin: Soft, nontender without any shifting dullness or ascites. Musculoskeletal: No clubbing or cyanosis. Neurological: No motor or sensory deficits. Skin: No rashes or lesions.              Lab Results: Lab Results  Component Value  Date   WBC 6.8 04/28/2021   HGB 10.4 (L) 04/28/2021   HCT 32.7 (L) 04/28/2021   MCV 86.7 04/28/2021   PLT 179 04/28/2021     Chemistry      Component Value Date/Time   NA 141 01/20/2021 0801   K 4.1 01/20/2021 0801   CL 104 01/20/2021 0801   CO2 22 01/20/2021 0801   BUN 30 (H) 01/20/2021 0801   CREATININE 1.08 01/20/2021 0801   CREATININE 0.98 12/23/2020 0900   CREATININE 1.13 07/29/2020 0831      Component Value Date/Time   CALCIUM 9.0 01/20/2021 0801   ALKPHOS 61 12/23/2020 0900   AST 19 12/23/2020 0900   ALT 17 12/23/2020 0900   BILITOT 0.6 12/23/2020 0900          Results for KENNETT, LANTIGUA (MRN [id number]) as of 04/28/2021 09:39  Ref. Range 12/23/2020 09:00  Prostate Specific Ag, Serum Latest Ref Range: 0.0 - 4.0 ng/mL <0.1            Impression and Plan:  75 year old man with:   1.  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    His disease status was updated at this time and treatment options were reviewed.  He continues to benefit from [place] with excellent clinical and PSA response that is currently undetectable.  Complications associated with this therapy that include nausea, fatigue, hypertension and hypokalemia among others were reviewed.  Alternative treatment options were also discussed which will be used as a salvage in the future.    2.  Androgen deprivation: He will receive Eligard today and repeated in 4 months.   3.  Goals of care and prognosis: His disease is palliative although aggressive measures are warranted given his excellent performance status.  4.  Hypokalemia: resolved with potassium is normal at this time.    5.  Follow-up: In 4 months for repeat evaluation.   30  minutes were spent on this encounter.  The time was dedicated to reviewing complications related to his cancer, cancer therapy and future plan of care discussion.        [name], MD 9/6/20229:52 AM       And

## 2021-04-29 ENCOUNTER — Telehealth: Payer: Self-pay | Admitting: *Deleted

## 2021-04-29 ENCOUNTER — Telehealth: Payer: Self-pay | Admitting: Oncology

## 2021-04-29 LAB — PROSTATE-SPECIFIC AG, SERUM (LABCORP): Prostate Specific Ag, Serum: <0.1 ng/mL (ref 0.0–4.0)

## 2021-04-29 NOTE — Telephone Encounter (Signed)
Left message with follow-up appointment per 9/6 los. 

## 2021-04-29 NOTE — Telephone Encounter (Signed)
Notified of message below

## 2021-04-29 NOTE — Telephone Encounter (Signed)
-----   Message from Wyatt Portela, MD sent at 04/29/2021  8:49 AM EDT ----- Please let him know his PSA is low

## 2021-05-04 ENCOUNTER — Telehealth: Payer: Self-pay | Admitting: Adult Health

## 2021-05-04 NOTE — Telephone Encounter (Signed)
Pt need to schedule f/u visit to have TSH checked.

## 2021-05-04 NOTE — Telephone Encounter (Signed)
Patient is requesting a refill for levothyroxine (SYNTHROID) 150 MCG tablet [id number] and doxazosin (CARDURA) 8 MG tablet [id number] to be sent to his pharmacy.  The pharmacy is [facility] [address], [place], [place] [address]  Please advise.

## 2021-05-11 ENCOUNTER — Other Ambulatory Visit: Payer: Self-pay

## 2021-05-12 ENCOUNTER — Ambulatory Visit (INDEPENDENT_AMBULATORY_CARE_PROVIDER_SITE_OTHER): Payer: Medicare Other | Admitting: Adult Health

## 2021-05-12 ENCOUNTER — Encounter: Payer: Self-pay | Admitting: Oncology

## 2021-05-12 ENCOUNTER — Encounter (HOSPITAL_COMMUNITY): Payer: Self-pay | Admitting: Internal Medicine

## 2021-05-12 VITALS — BP 138/84 | HR 62 | Temp 99.2°F | Ht 68.0 in | Wt 192.0 lb

## 2021-05-12 DIAGNOSIS — E119 Type 2 diabetes mellitus without complications: Secondary | ICD-10-CM

## 2021-05-12 DIAGNOSIS — I10 Essential (primary) hypertension: Secondary | ICD-10-CM | POA: Diagnosis not present

## 2021-05-12 DIAGNOSIS — E039 Hypothyroidism, unspecified: Secondary | ICD-10-CM

## 2021-05-12 LAB — TSH: TSH: 0.21 u[IU]/mL — ABNORMAL LOW (ref 0.35–5.50)

## 2021-05-12 LAB — HEMOGLOBIN A1C: Hgb A1c MFr Bld: 6.3 % (ref 4.6–6.5)

## 2021-05-12 NOTE — Progress Notes (Signed)
Subjective:    Patient ID: Matthew Serrano., male    DOB: 1945/09/08, 75 y.o.   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  He presents to the office today for follow-up regarding hypothyroidism/HTN/DM .    Hypothyroidism - He is currently prescribed Synthroid 150 mg daily.  He does feel well controlled on this medication. Needs a refill of Synthroid   Lab Results  Component Value Date   TSH 0.62 07/29/2020   HTN -prescribed doxazosin 8 mg, clobetasol 600 mg in the morning and 400 mg in the evening and, and torsemide 20 mg twice daily.  He denies dizziness, lightheadedness, chest pain, or shortness of breath. He needs a refill of of Doxazosin  BP Readings from Last 3 Encounters:  04/28/21 122/62  01/22/21 130/62  12/23/20 129/78   DM -only prescribed glipizide 10 mg ER twice daily and metformin 1000 mg twice daily.  He does monitor his blood sugars at home and reports readings consistently in the 110s to 120s.  He denies any episodes of hypoglycemia. 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[place]) 05/14/2020   History of nuclear stress test    Myoview 8/18: EF 64, inferobasal thinning, no ischemia, low risk   HYPERLIPIDEMIA 07/06/2007   HYPERTENSION 07/06/2007   HYPOTHYROIDISM 07/06/2007   Leg pain    ABIs 8/18:  Normal    Obesity    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   23 mm human aortic root graft with 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 and reimplantation of left main and right coronary arteries   Severe aortic insufficiency 05/13/2020   acute onset in setting of bacterial endocarditis   Severe aortic stenosis     Social History   Socioeconomic History   Marital status: Widowed    Spouse name: Not on file   Number of children: Not on file   Years of education: Not on file   Highest education level: Not on file  Occupational History   Not on file  Tobacco Use   Smoking status: Former   Smokeless tobacco: Never   Tobacco comments:    quit 2005  Vaping Use   Vaping Use: Never used  Substance and Sexual Activity   Alcohol use: Not Currently    Alcohol/week: 21.0 standard drinks    Types: 21 Cans of beer per week   Drug use: No   Sexual activity: Not on file  Other Topics Concern   Not on file  Social History Narrative   He works as a Freight forwarder    Not married  No kids          Social Determinants of Radio broadcast assistant Strain: Low Risk    Difficulty of Paying Living Expenses: Not hard at all  Food Insecurity: No Food Insecurity   Worried About Charity fundraiser in the Last Year: Never true   Arboriculturist in the Last Year: Never true  Transportation Needs: No Transportation Needs   Lack of Transportation (Medical): No   Lack of Transportation (Non-Medical): No  Physical Activity: Inactive   Days of Exercise per Week: 0 days   Minutes of Exercise per Session: 0 min  Stress: No Stress Concern Present   Feeling of Stress :  Not at all  Social Connections: Socially Isolated   Frequency of Communication with Friends and Family: More than three times a week   Frequency of Social Gatherings with Friends and Family: More than three times a week   Attends Religious Services: Never   Marine scientist or Organizations: No   Attends Archivist Meetings: Never   Marital Status: Widowed  Human resources officer Violence: Not At Risk   Fear of Current or Ex-Partner: No   Emotionally Abused: No   Physically Abused: No   Sexually Abused: No    Past Surgical History:  Procedure Laterality Date   AORTIC VALVE REPLACEMENT N/A 05/15/2020   Procedure: AORTIC VALVE REPLACEMENT (AVR) SIZE 23 MM,  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;  Surgeon: Rexene Alberts, MD;  Location: [facility];  Service: Open Heart Surgery;  Laterality: N/A;   ASCENDING AORTIC ROOT REPLACEMENT N/A 05/15/2020   Procedure: HUMAN ALLOGRAFT AORTIC ROOT REPLACEMENT;  Surgeon: Rexene Alberts, MD;  Location: [place];  Service: Open Heart Surgery;  Laterality: N/A;   COLONOSCOPY     COLONOSCOPY N/A 05/03/2020   Procedure: COLONOSCOPY;  Surgeon: Juanita Craver, MD;  Location: [facility];  Service: Endoscopy;  Laterality: N/A;   COLONOSCOPY W/ BIOPSIES AND POLYPECTOMY     ENTEROSCOPY N/A 05/02/2020   Procedure: ENTEROSCOPY;  Surgeon: Rush Landmark Telford Nab., MD;  Location: [facility];  Service: Gastroenterology;  Laterality: N/A;   HEMOSTASIS CLIP PLACEMENT  05/03/2020   Procedure: HEMOSTASIS CLIP PLACEMENT;  Surgeon: Juanita Craver, MD;  Location: [facility];  Service: Endoscopy;;   HOT HEMOSTASIS N/A 05/03/2020   Procedure: HOT HEMOSTASIS (ARGON PLASMA COAGULATION/BICAP);  Surgeon: Juanita Craver, MD;  Location: [facility];  Service: Endoscopy;  Laterality: N/A;   IR IMAGING GUIDED PORT INSERTION  07/05/2018   POLYPECTOMY     PORT-A-CATH REMOVAL Right 05/20/2020   Procedure: REMOVAL PORT-A-CATH;  Surgeon: Rexene Alberts, MD;  Location: [facility];  Service: Thoracic;  Laterality: Right;   RIGHT/LEFT HEART CATH AND CORONARY ANGIOGRAPHY N/A 02/13/2020   Procedure: RIGHT/LEFT HEART CATH AND CORONARY ANGIOGRAPHY;  Surgeon: Belva Crome, MD;  Location: [place] [facility];  Service: Cardiovascular;  Laterality: N/A;   SUBMUCOSAL INJECTION  05/02/2020   Procedure: SUBMUCOSAL INJECTION;  Surgeon: Rush Landmark Telford Nab., MD;  Location: [facility];  Service: Gastroenterology;;   TEE WITHOUT CARDIOVERSION N/A 05/14/2020   Procedure: TRANSESOPHAGEAL ECHOCARDIOGRAM (TEE);  Surgeon: Sanda Klein, MD;  Location: [place];  Service: Cardiovascular;  Laterality: N/A;   TEE WITHOUT CARDIOVERSION N/A 05/15/2020   Procedure: TRANSESOPHAGEAL ECHOCARDIOGRAM (TEE);  Surgeon: Rexene Alberts, MD;  Location: [place];  Service: Open Heart Surgery;  Laterality: N/A;    Family History  Problem Relation Age of Onset   Hypertension Other  Breast cancer Sister    Breast cancer Other        family history   Breast cancer Brother        prostate ca   Aneurysm Mother        Brain aneurysm    Prostate cancer Father    Colon cancer Neg Hx    Esophageal cancer Neg Hx    Stomach cancer Neg Hx    Rectal cancer Neg Hx     No Known Allergies  Current Outpatient Medications on File Prior to Visit  Medication Sig Dispense Refill   abiraterone acetate (ZYTIGA) 250 MG tablet TAKE 4 TABLETS (1,000 MG TOTAL) BY MOUTH DAILY. TAKE ON AN EMPTY STOMACH 1 HOUR BEFORE OR 2 HOURS AFTER A MEAL 120 tablet 0   amoxicillin (AMOXIL) 500 MG tablet Take 4 tablets (2,000 mg total) 1 hour prior to all dental visits. 12 tablet 12   aspirin EC 81 MG tablet Take 81 mg by mouth daily. Swallow whole.     atorvastatin (LIPITOR) 80 MG tablet Take 1 tablet (80 mg total) by mouth daily. 90 tablet 2   cholecalciferol (VITAMIN D3) 25 MCG (1000 UT) tablet Take 1,000 Units by mouth daily.     doxazosin (CARDURA) 8 MG tablet TAKE 1 TABLET AT BEDTIME 90 tablet 3   ferrous sulfate 325  (65 FE) MG EC tablet Take 1 tablet (325 mg total) by mouth 2 (two) times daily before a meal. 60 tablet 2   glipiZIDE (GLUCOTROL XL) 10 MG 24 hr tablet TAKE 1 TABLET TWICE DAILY 90 tablet 0   labetalol (NORMODYNE) 200 MG tablet TAKE 3 TABLETS EVERY MORNING  AND TAKE 2 TABLETS EVERY EVENING 450 tablet 3   levothyroxine (SYNTHROID) 150 MCG tablet TAKE 1 TABLET EVERY DAY 90 tablet 0   metFORMIN (GLUCOPHAGE) 1000 MG tablet TAKE 1 TABLET TWICE DAILY WITH MEALS (NEED MD APPOINTMENT FOR REFILLS) 180 tablet 0   omeprazole (PRILOSEC) 40 MG capsule TAKE 1 CAPSULE BY MOUTH EVERY DAY 90 capsule 1   potassium chloride SA (KLOR-CON) 20 MEQ tablet Take 2 tablets (40 mEq total) by mouth 2 (two) times daily. 360 tablet 3   predniSONE (DELTASONE) 5 MG tablet TAKE 1 TABLET BY MOUTH ONCE A DAY WITH BREAKFAST 90 tablet 3   torsemide (DEMADEX) 20 MG tablet Take 1 tablet (20 mg total) by mouth 2 (two) times daily. 180 tablet 3   No current facility-administered medications on file prior to visit.    There were no vitals taken for this visit.      Objective:   Physical Exam Vitals and nursing note reviewed.  Constitutional:      Appearance: Normal appearance.  Cardiovascular:     Rate and Rhythm: Normal rate and regular rhythm.     Pulses: Normal pulses.     Heart sounds: Murmur heard.  Systolic murmur is present with a grade of 4/6.  Pulmonary:     Effort: Pulmonary effort is normal.     Breath sounds: Normal breath sounds.  Musculoskeletal:        General: Normal range of motion.  Skin:    General: Skin is warm and dry.     Capillary Refill: Capillary refill takes less than 2 seconds.  Neurological:     General: No focal deficit present.     Mental Status: He is alert and oriented to person, place, and time.  Psychiatric:        Mood and Affect: Mood normal.  Behavior: Behavior normal.        Thought Content: Thought content normal.        Judgment: Judgment normal.      Assessment &  Plan:

## 2021-05-13 ENCOUNTER — Encounter: Payer: Self-pay | Admitting: Adult Health

## 2021-05-13 MED ORDER — DOXAZOSIN MESYLATE 8 MG PO TABS: 8.0000 mg | ORAL_TABLET | Freq: Every day | ORAL | 3 refills | Status: DC

## 2021-05-13 MED ORDER — GLIPIZIDE ER 10 MG PO TB24: ORAL_TABLET | ORAL | 0 refills | Status: DC

## 2021-05-13 MED ORDER — LEVOTHYROXINE SODIUM 125 MCG PO TABS: 125.0000 ug | ORAL_TABLET | Freq: Every day | ORAL | 0 refills | Status: DC

## 2021-05-13 MED ORDER — METFORMIN HCL 1000 MG PO TABS: 1000.0000 mg | ORAL_TABLET | Freq: Two times a day (BID) | ORAL | 0 refills | Status: DC

## 2021-05-14 ENCOUNTER — Telehealth: Payer: Self-pay | Admitting: Pharmacist

## 2021-05-14 NOTE — Chronic Care Management (AMB) (Signed)
    Chronic Care Management Pharmacy Assistant   Name: Matthew Serrano.  MRN: [id number] DOB: 1946-06-28  05/15/21 APPOINTMENT Matthew Park Mccready Jr. was reminded to have all medications, supplements and any blood glucose and blood pressure readings available for review with Jeni Salles, Pharm. D, at his telephone visit on 05/15/21 at 9am.   Questions: Have you had any recent office visit or specialist visit outside of [place]? No.  Are there any concerns you would like to discuss during your office visit? No  concerns at this time.  Are you having any problems obtaining your medications? No issues at this time.  If patient has any PAP medications ask if they are having any problems getting their PAP medication or refill? No patient assistance at this time.    Care Gaps:  AWV - scheduled for 11/13/21 Zoster vaccines - never done Ophthalmology exam - overdue since 2016 Covid-19 vaccine booster 4 - overdue since 10/17/20 Flu vaccine - due  Star Rating Drug:  Glipizide 10mg  - last filled on 05/13/21 45DS at [facility] Metformin 1000mg  - last filled on 05/13/21 90DS at [facility]  Any gaps in medications fill history? No.  [place]  Clinical Pharmacist Assistant [phone]

## 2021-05-15 ENCOUNTER — Ambulatory Visit: Payer: Medicare Other | Admitting: Pharmacist

## 2021-05-15 ENCOUNTER — Other Ambulatory Visit: Payer: Self-pay | Admitting: Oncology

## 2021-05-15 ENCOUNTER — Other Ambulatory Visit (HOSPITAL_COMMUNITY): Payer: Self-pay

## 2021-05-15 ENCOUNTER — Other Ambulatory Visit: Payer: Self-pay | Admitting: Adult Health

## 2021-05-15 DIAGNOSIS — C61 Malignant neoplasm of prostate: Secondary | ICD-10-CM

## 2021-05-15 DIAGNOSIS — I10 Essential (primary) hypertension: Secondary | ICD-10-CM

## 2021-05-15 DIAGNOSIS — E782 Mixed hyperlipidemia: Secondary | ICD-10-CM

## 2021-05-15 DIAGNOSIS — E119 Type 2 diabetes mellitus without complications: Secondary | ICD-10-CM

## 2021-05-15 DIAGNOSIS — Z7952 Long term (current) use of systemic steroids: Secondary | ICD-10-CM

## 2021-05-15 MED ORDER — ATORVASTATIN CALCIUM 80 MG PO TABS: 80.0000 mg | ORAL_TABLET | Freq: Every day | ORAL | 2 refills | Status: DC

## 2021-05-15 MED ORDER — LABETALOL HCL 200 MG PO TABS: ORAL_TABLET | ORAL | 3 refills | Status: DC

## 2021-05-15 MED ORDER — ABIRATERONE ACETATE 250 MG PO TABS
ORAL_TABLET | Freq: Every day | ORAL | 0 refills | Status: DC
  Filled 2021-05-15: qty 120, 30d supply, fill #0

## 2021-05-15 NOTE — Progress Notes (Signed)
Chronic Care Management Pharmacy Note  05/15/2021 Name:  Matthew Serrano. MRN:  [id number] DOB:  1946-07-08  Summary: A1c is at goal of < 7%  LDL not at goal < 70  Recommendations/Changes made from today's visit: -Requested refills for glipizide, atorvastatin and [facility]etalol [facility] -Consider switching doxazosin for alternative more effective blood pressure medication due to lack of BPH symptoms -Recommended checking blood sugars if having hypoglycemia symptoms -Recommend DEXA with long term steroid use  Plan: Follow up in 4 months   Subjective: Matthew Serrano. is an 75 y.o. year old male who is a primary patient of [name], NP.  The CCM team was consulted for assistance with disease management and care coordination needs.    Engaged with patient by telephone for follow up visit in response to provider referral for pharmacy case management and/or care coordination services.   Consent to Services:  The patient was given information about Chronic Care Management services, agreed to services, and gave verbal consent prior to initiation of services.  Please see initial visit note for detailed documentation.   Patient Care Team: [name], NP as PCP - General (Family Medicine) [name], MD as PCP - Cardiology (Cardiology) [name], MD as Attending Physician (Urology) [name], [facility] as Pharmacist (Pharmacist)  Recent office visits: 05/12/21 [name] NP- Patient presented for chronic conditions follow up. Decreased levothyroxine to 125 mcg daily.  01/29/21 CCM pharmacist visit: Increased atorvastatin to 80 mg daily.  01/22/21 [name] NP- seen for diabetes mellitus. No medication changes. Come back after december.   11/12/20 [name], LPN: Patient presented for AWV.  Recent consult visits: 04/28/21 [name] MD ( Oncology) - presented to clinic for follow up on prostate cancer and Eligard infusion. No medication  changes. Follow up in 4 months.  12/23/20 [name] MD ( Oncology) - presented to clinic for follow up on prostate cancer. No medication changes. Follow up in 4 months.   12/22/20 [name] MD ( Cardiology) - presented to clinic for follow up on heart failure and other chronic conditions. Refilled amoxicillin 574m for dental visits as needed. Follow up in 6 months.   09/08/20 CDarylene PriceMD (Cardiothoracic Surgery) - seen for follow up after aortic root replacement. No medication changes and no follow up noted.   08/28/20 JMichel BickersMD (Infectious Disease) - seen for aortic valve endocarditis. No medication changes. Follow up as noted.   08/26/20 FZola ButtonMD ( Oncology) - seen for follow up for prostate cancer. No medication changes. Follow up in 4 months.   08/07/20 JMichel BickersMD ( Infectious Disease ) -  Seen for aortic valve endocarditis. No medication changes. Follow up in 3 weeks.   07/28/20 SRichardson DoppPA-C( Cardiology ) - seen for heart failure and other chronic conditions. No medication changes. Follow up in 4 months.   Hospital visits: None in previous 6 months   Objective:  Lab Results  Component Value Date   CREATININE 1.15 04/28/2021   BUN 21 04/28/2021   GFR 83.11 07/26/2019   GFRNONAA >60 04/28/2021   GFRAA 99 07/03/2020   NA 145 04/28/2021   K 4.1 04/28/2021   CALCIUM 9.3 04/28/2021   CO2 26 04/28/2021   GLUCOSE 153 (H) 04/28/2021    Lab Results  Component Value Date/Time   HGBA1C 6.3 05/12/2021 07:20 AM   HGBA1C 6.0 (A) 01/22/2021 09:22 AM   HGBA1C 5.6 07/29/2020 08:31 AM  HGBA1C 6.3 02/22/2020 09:29 AM   HGBA1C 6.2 10/23/2019 10:15 AM   GFR 83.11 07/26/2019 09:21 AM   GFR 95.90 05/24/2018 02:30 PM   MICROALBUR <0.7 04/28/2016 07:56 AM   MICROALBUR 0.8 11/02/2013 08:54 AM    Last diabetic Eye exam:  Lab Results  Component Value Date/Time   HMDIABEYEEXA borderline ocular HTN 12/03/2010 12:00 AM    Last diabetic Foot exam: No results  found for: HMDIABFOOTEX   Lab Results  Component Value Date   CHOL 153 07/29/2020   HDL 48 07/29/2020   LDLCALC 82 07/29/2020   LDLDIRECT 105.0 04/28/2016   TRIG 129 07/29/2020   CHOLHDL 3.2 07/29/2020    Hepatic Function Latest Ref Rng & Units 04/28/2021 12/23/2020 08/26/2020  Total Protein 6.5 - 8.1 g/dL 5.6(L) 6.1(L) 6.2(L)  Albumin 3.5 - 5.0 g/dL 3.7 3.6 3.5  AST 15 - 41 U/L 22 19 14(L)  ALT 0 - 44 U/L '27 17 12  ' Alk Phosphatase 38 - 126 U/L 65 61 65  Total Bilirubin 0.3 - 1.2 mg/dL 0.7 0.6 0.8  Bilirubin, Direct 0.0 - 0.3 mg/dL - - -    Lab Results  Component Value Date/Time   TSH 0.21 (L) 05/12/2021 07:20 AM   TSH 0.62 07/29/2020 08:31 AM    CBC Latest Ref Rng & Units 04/28/2021 12/23/2020 12/22/2020  WBC 4.0 - 10.5 K/uL 6.8 7.6 6.6  Hemoglobin 13.0 - 17.0 g/dL 10.4(L) 10.8(L) 10.3(L)  Hematocrit 39.0 - 52.0 % 32.7(L) 33.7(L) 31.5(L)  Platelets 150 - 400 K/uL 179 187 190    No results found for: VD25OH  Clinical ASCVD: Yes  The 10-year ASCVD risk score (Arnett DK, et al., 2019) is: 38.1%   Values used to calculate the score:     Age: 23 years     Sex: Male     Is Non-Hispanic African American: Yes     Diabetic: Yes     Tobacco smoker: No     Systolic Blood Pressure: 706 mmHg     Is BP treated: Yes     HDL Cholesterol: 48 mg/dL     Total Cholesterol: 153 mg/dL    Depression screen [facility] 2/9 11/12/2020 08/07/2020 07/29/2020  Decreased Interest 0 0 0  Down, Depressed, Hopeless 0 1 0  PHQ - 2 Score 0 1 0  Some recent data might be hidden       Social History   Tobacco Use  Smoking Status Former  Smokeless Tobacco Never  Tobacco Comments   quit 2005   BP Readings from Last 3 Encounters:  05/12/21 138/84  04/28/21 122/62  01/22/21 130/62   Pulse Readings from Last 3 Encounters:  05/12/21 62  04/28/21 68  01/22/21 73   Wt Readings from Last 3 Encounters:  05/12/21 192 lb (87.1 kg)  04/28/21 188 lb (85.3 kg)  01/22/21 183 lb 9.6 oz (83.3 kg)   BMI  Readings from Last 3 Encounters:  05/12/21 29.19 kg/m  04/28/21 28.59 kg/m  01/22/21 27.92 kg/m    Assessment/Interventions: Review of patient past medical history, allergies, medications, health status, including review of consultants reports, laboratory and other test data, was performed as part of comprehensive evaluation and provision of chronic care management services.   SDOH:  (Social Determinants of Health) assessments and interventions performed: Yes   SDOH Screenings   Alcohol Screen: Low Risk    Last Alcohol Screening Score (AUDIT): 1  Depression (PHQ2-9): Low Risk    PHQ-2 Score: 0  Financial Resource Strain:  Low Risk    Difficulty of Paying Living Expenses: Not hard at all  Food Insecurity: No Food Insecurity   Worried About Charity fundraiser in the Last Year: Never true   Ran Out of Food in the Last Year: Never true  Housing: Low Risk    Last Housing Risk Score: 0  Physical Activity: Inactive   Days of Exercise per Week: 0 days   Minutes of Exercise per Session: 0 min  Social Connections: Socially Isolated   Frequency of Communication with Friends and Family: More than three times a week   Frequency of Social Gatherings with Friends and Family: More than three times a week   Attends Religious Services: Never   Marine scientist or Organizations: No   Attends Archivist Meetings: Never   Marital Status: Widowed  Stress: No Stress Concern Present   Feeling of Stress : Not at all  Tobacco Use: Medium Risk   Smoking Tobacco Use: Former   Smokeless Tobacco Use: Never  Transportation Needs: No Data processing manager (Medical): No   Lack of Transportation (Non-Medical): No    CCM Care Plan  No Known Allergies  Medications Reviewed Today     Reviewed by [name], NP (Nurse Practitioner) on 05/13/21 at 0700  Med List Status: <None>   Medication Order Taking? Sig Documenting Provider Last Dose Status Informant   abiraterone acetate (ZYTIGA) 250 MG tablet [id number] Yes TAKE 4 TABLETS (1,000 MG TOTAL) BY MOUTH DAILY. TAKE ON AN EMPTY STOMACH 1 HOUR BEFORE OR 2 HOURS AFTER A MEAL Shadad, Mathis Dad, MD Taking Active   amoxicillin (AMOXIL) 500 MG tablet [id number] Yes Take 4 tablets (2,000 mg total) 1 hour prior to all dental visits. [name], MD Taking Active   aspirin EC 81 MG tablet [id number] Yes Take 81 mg by mouth daily. Swallow whole. [provider] Taking Active   atorvastatin (LIPITOR) 80 MG tablet [id number] Yes Take 1 tablet (80 mg total) by mouth daily. [name], [name], NP Taking Active   cholecalciferol (VITAMIN D3) 25 MCG (1000 UT) tablet [id number] Yes Take 1,000 Units by mouth daily. [provider] Taking Active Self  cloNIDine (CATAPRES) 0.2 MG tablet [id number]   [provider]  Active   doxazosin (CARDURA) 8 MG tablet [id number]  Take 1 tablet (8 mg total) by mouth at bedtime. [name], [name], NP  Active   ferrous sulfate 325 (65 FE) MG EC tablet [id number] Yes Take 1 tablet (325 mg total) by mouth 2 (two) times daily before a meal. Willia Craze, NP Taking Active Self           Med Note Broadus John, REGEENA   Tue May 13, 2020  2:46 PM)    glipiZIDE (GLUCOTROL XL) 10 MG 24 hr tablet [id number]  TAKE 1 TABLET TWICE DAILY [name], [name], NP  Active   labetalol (NORMODYNE) 200 MG tablet [id number] Yes TAKE 3 TABLETS EVERY MORNING  AND TAKE 2 TABLETS EVERY EVENING [name], Cory, NP Taking Active   levothyroxine (SYNTHROID) 125 MCG tablet [id number] Yes Take 1 tablet (125 mcg total) by mouth daily. [name], [name], NP  Active   metFORMIN (GLUCOPHAGE) 1000 MG tablet [id number]  Take 1 tablet (1,000 mg total) by mouth 2 (two) times daily with a meal. [name], [name], NP  Active   omeprazole (PRILOSEC) 40 MG capsule [id number] Yes TAKE 1 CAPSULE BY MOUTH EVERY DAY Willia Craze, NP Taking Active   potassium chloride SA (  KLOR-CON) 20 MEQ tablet [id number] Yes Take 2 tablets (40  mEq total) by mouth 2 (two) times daily. Richardson Dopp T, PA-C Taking Active   predniSONE (DELTASONE) 5 MG tablet [id number] Yes TAKE 1 TABLET BY MOUTH ONCE A DAY WITH BREAKFAST Wyatt Portela, MD Taking Active   torsemide (DEMADEX) 20 MG tablet [id number]  Take 1 tablet (20 mg total) by mouth 2 (two) times daily. 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   Streptococcal bacteremia 05/13/2020   Acute CHF (congestive heart failure) ([place]) 05/13/2020   Respiratory distress 05/13/2020   Aortic valve endocarditis 05/13/2020   Severe aortic insufficiency 05/13/2020   Acute blood loss anemia 05/02/2020   Malignant neoplasm of prostate ([place]) 06/23/2018   Goals of care, counseling/discussion 06/23/2018   Aortic stenosis    Left inguinal hernia 09/04/2015   Alcoholism ([place]) 06/10/2014   Anemia of chronic disease 05/23/2014   Elevated PSA 01/20/2012   HEMORRHAGE OF RECTUM AND ANUS 12/15/2008   ERECTILE DYSFUNCTION, MILD 12/14/2007   SEBORRHEIC KERATOSIS, INFLAMED 08/03/2007   NEOPLASM, SKIN, UNCERTAIN BEHAVIOR 53/74/8270   Hypothyroidism 07/06/2007   Diabetes mellitus ([place]) 07/06/2007   Hyperlipidemia 07/06/2007   Essential hypertension 07/06/2007    Immunization History  Administered Date(s) Administered   Fluad Quad(high Dose 65+) 07/26/2019, 07/29/2020   Influenza Split 07/12/2011   Influenza Whole 05/23/2008, 06/13/2009, 05/11/2010   Influenza, High Dose Seasonal PF 05/03/2016, 05/24/2018   Influenza,inj,Quad PF,6+ Mos 05/16/2014   PFIZER(Purple  Top)SARS-COV-2 Vaccination 10/21/2019, 11/20/2019, 07/25/2020   Pneumococcal Conjugate-13 11/13/2013   Pneumococcal Polysaccharide-23 06/02/2000, 12/24/2004, 05/03/2016   Td 01/05/1996, 01/12/2007   Patient reports that he sometimes sleeps a little too much during the day and is taking a lot more naps lately. Recommended limiting napping and possibly setting a timer to avoid oversleeping.   Conditions to be addressed/monitored:  Hypertension, Hyperlipidemia, Diabetes, Heart Failure, GERD, Hypothyroidism, and ED and cancer  Conditions addressed this visit: Hypertension, diabetes  Care Plan : [place]  Updates made by [name], [place] since 05/15/2021 12:00 AM     Problem: Problem: Hypertension, Hyperlipidemia, Diabetes, Heart Failure, GERD, Hypothyroidism, and ED and cancer      Long-Range Goal: Patient-Specific Goal   Start Date: 01/29/2021  Expected End Date: 01/29/2022  Recent Progress: On track  Priority: High  Note:   Current Barriers:  Unable to independently monitor therapeutic efficacy Unable to achieve control of cholesterol  Suboptimal therapeutic regimen for blood pressure  Pharmacist Clinical Goal(s):  Patient will achieve adherence to monitoring guidelines and medication adherence to achieve therapeutic efficacy achieve control of cholesterol as evidenced by next lipid panel  through collaboration with PharmD and provider.   Interventions: 1:1 collaboration with [name], NP regarding development and update of comprehensive plan of care as evidenced by provider attestation and co-signature Inter-disciplinary care team collaboration (see longitudinal plan of care) Comprehensive medication review performed; medication list updated in electronic medical record  Hypertension (BP goal <140/90) -Not ideally controlled -Current treatment: Doxazosin 8 mg  1 tablet at bedtime Labetalol 200 mg 3 tablets every morning and 2 tablets at  bedtime -Medications previously tried: none  -Current home readings: 128-130/69-70 (checking 3 times a week) -Current dietary habits: doesn't add any salt to his food; doesn't look at package labels -Current exercise habits: does not exercise routinely -Denies hypotensive/hypertensive symptoms -Educated on Importance of home blood pressure monitoring; Proper BP monitoring technique; Symptoms of hypotension and importance of maintaining adequate hydration; -Counseled to monitor BP at home weekly, document, and provide log at future appointments -Counseled on diet and exercise extensively Recommended to continue current medication Recommended setting a goal of walking for 15 minutes 3 days a week.  Hyperlipidemia: (LDL goal < 70) -Uncontrolled -Current treatment: Atorvastatin 80 mg 1 tablet daily -Medications previously tried: none  -Current dietary patterns: mostly eating baked or boiled foods; cooks with vegetable oil and recommended olive or canola -Current exercise habits: no structured exercise -Educated on Cholesterol goals;  Benefits of statin for ASCVD risk reduction; Importance of limiting foods high in cholesterol; Exercise goal of 150 minutes per week; -Counseled on diet and exercise extensively Recommended to repeat lipid panel.  Diabetes (A1c goal <7%) -Controlled -Current medications: Glipizide 10 mg 1 tablet twice daily  Metformin 1000 mg 1 tablet twice daily -Medications previously tried: none  -Current home glucose readings fasting glucose: 80, 110 (twice a week) post prandial glucose: 140-180 -Reports hypoglycemic/hyperglycemic symptoms -Current meal patterns:  breakfast: grits or cereal and 1 egg  lunch: n/a  dinner: n/a snacks: ice cream drinks: n/a -Current exercise: no structured exercise -Educated on A1c and blood sugar goals; Exercise goal of 150 minutes per week; Carbohydrate counting and/or plate method Counseled to check feet daily and get  yearly eye exams -Counseled to check feet daily and get yearly eye exams -Counseled on diet and exercise extensively Recommended to continue current medication  CAD (Goal: prevent heart events) -Controlled -Current treatment  Aspirin 81 mg 1 tablet daily Atorvastatin 80 mg 1 tablet daily -Medications previously tried: none  -Counseled on limiting NSAID use with aspirin Educated on monitoring for signs of bleeding such as unexplained and excessive bleeding from a cut or injury, easy or excessive bruising, blood in urine or stools, and nosebleeds without a known cause  Heart Failure (Goal: manage symptoms and prevent exacerbations) -Controlled -Last ejection fraction: 55-60% (Date: 12/24/20) -HF type: Diastolic -NYHA Class: II (slight limitation of activity) -AHA HF Stage: C (Heart disease and symptoms present) -Current treatment: Torsemide 20 mg 1 tablet twice daily Potassium chloride 20 mEq 2 tablets by mouth twice daily -Medications previously tried: none  -Current home BP/HR readings: refer to above -Current dietary habits: refer to above -Current exercise habits: limited activity -Educated on Importance of weighing daily; if you gain more than 3 pounds in one day or 5 pounds in one week, call cardiologist Proper diuretic administration and potassium supplementation Importance of blood pressure control -Counseled on diet and exercise extensively Recommended to continue current medication  Anemia (Goal: ferritin 24-380) -Controlled -Current treatment  Ferrous sulfate 325 mg 1 tablet twice daily -Medications previously tried: none  -Recommended to continue current medication  Hypothyroidism (Goal: 0.35-4.5) -Controlled -Current treatment  Levothyroxine 125 mcg 1 tablet daily -Medications previously tried: none  -Counseled on importance of taking on an empty stomach and separating from iron tablet by 4 hours or more  Prostate cancer (Goal: remission) -Controlled -Current  treatment  Zytiga 250 mg 4 tablets daily Prednisone 5 mg 1 tablet daily -Medications previously tried:  none  -Recommended to continue current medication Recommended DEXA. Patient acquires through patient assistance program.  Stomach protection (Goal: prevent stomach bleeds) -Controlled -Current treatment  Omeprazole 40 mg 1 capsule every day -Medications previously tried: none  -Educated on use for protection of stomach. Will reassess the need for it at follow up.   Health Maintenance -Vaccine gaps: shingrix, COVID booster -Current therapy:  Vitamin D3 1000 units daily Amoxicillin 500 mg 4 tablets prior to dental visits -Educated on Cost vs benefit of each product must be carefully weighed by individual consumer -Patient is satisfied with current therapy and denies issues -Recommended to continue current medication  Patient Goals/Self-Care Activities Patient will:  - take medications as prescribed check glucose a few times a week, document, and provide at future appointments check blood pressure weekly, document, and provide at future appointments target a minimum of 150 minutes of moderate intensity exercise weekly  Follow Up Plan: Telephone follow up appointment with care management team member scheduled for: 4 months       Medication Assistance: None required.  Patient affirms current coverage meets needs.  Compliance/Adherence/Medication fill history: Care Gaps: Shingrix, eye exam, COVID booster  Star-Rating Drugs: Glipizide 62m - [facility] Metformin 10063m- [facility]atin [facility] mg [facility]  [place] [id number]: [phone]: [id number] [place]: [phone]: [phone] [place]one: [phone]: [phone] pill box? Yes - weekly (one time slot) - early morning he doesn't put in a pillbox (3 days at a time) Pt endorses 99% compliance   We discussed: Current pharmacy is preferred with insurance plan and patient is satisfied with pharmacy services Patient decided to: Continue current medication management strategy  Care Plan and Follow Up Patient Decision:  Patient agrees to Care Plan and Follow-up.  Plan: Telephone follow up appointment with care management team member scheduled for:  4 months  MadJeni SallesharmD, BCADavenportarmacist [place] [phone]

## 2021-05-18 ENCOUNTER — Other Ambulatory Visit (HOSPITAL_COMMUNITY): Payer: Self-pay

## 2021-05-22 DIAGNOSIS — E119 Type 2 diabetes mellitus without complications: Secondary | ICD-10-CM | POA: Diagnosis not present

## 2021-05-22 DIAGNOSIS — I10 Essential (primary) hypertension: Secondary | ICD-10-CM

## 2021-06-05 ENCOUNTER — Other Ambulatory Visit: Payer: Self-pay | Admitting: Adult Health

## 2021-06-05 ENCOUNTER — Telehealth: Payer: Self-pay | Admitting: Pharmacist

## 2021-06-05 DIAGNOSIS — E039 Hypothyroidism, unspecified: Secondary | ICD-10-CM

## 2021-06-05 NOTE — Chronic Care Management (AMB) (Signed)
Chronic Care Management Pharmacy Assistant   Name: Matthew No.  MRN: [id number] DOB: 1945-12-19   Reason for Encounter: Disease State / Diabetes Assessment Call   Conditions to be addressed/monitored: DMII   Recent office visits:  None  Recent consult visits:  None  Hospital visits:  None in previous 6 months  Medications: Outpatient Encounter Medications as of 06/05/2021  Medication Sig   abiraterone acetate (ZYTIGA) 250 MG tablet TAKE 4 TABLETS (1,000 MG TOTAL) BY MOUTH DAILY. TAKE ON AN EMPTY STOMACH 1 HOUR BEFORE OR 2 HOURS AFTER A MEAL   amoxicillin (AMOXIL) 500 MG tablet Take 4 tablets (2,000 mg total) 1 hour prior to all dental visits.   aspirin EC 81 MG tablet Take 81 mg by mouth daily. Swallow whole.   atorvastatin (LIPITOR) 80 MG tablet Take 1 tablet (80 mg total) by mouth daily.   cholecalciferol (VITAMIN D3) 25 MCG (1000 UT) tablet Take 1,000 Units by mouth daily.   cloNIDine (CATAPRES) 0.2 MG tablet    doxazosin (CARDURA) 8 MG tablet Take 1 tablet (8 mg total) by mouth at bedtime.   ferrous sulfate 325 (65 FE) MG EC tablet Take 1 tablet (325 mg total) by mouth 2 (two) times daily before a meal.   glipiZIDE (GLUCOTROL XL) 10 MG 24 hr tablet TAKE 1 TABLET TWICE DAILY   labetalol (NORMODYNE) 200 MG tablet TAKE 3 TABLETS EVERY MORNING  AND TAKE 2 TABLETS EVERY EVENING   levothyroxine (SYNTHROID) 125 MCG tablet TAKE 1 TABLET BY MOUTH EVERY DAY   metFORMIN (GLUCOPHAGE) 1000 MG tablet Take 1 tablet (1,000 mg total) by mouth 2 (two) times daily with a meal.   omeprazole (PRILOSEC) 40 MG capsule TAKE 1 CAPSULE BY MOUTH EVERY DAY   potassium chloride SA (KLOR-CON) 20 MEQ tablet Take 2 tablets (40 mEq total) by mouth 2 (two) times daily.   predniSONE (DELTASONE) 5 MG tablet TAKE 1 TABLET BY MOUTH ONCE A DAY WITH BREAKFAST   torsemide (DEMADEX) 20 MG tablet Take 1 tablet (20 mg total) by mouth 2 (two) times daily.   No facility-administered encounter medications  on file as of 06/05/2021.   Fill History: abiraterone acetate (ZYTIGA) 250 MG tablet 05/18/2021 30   ATORVASTATIN 80 MG TABLET 05/15/2021 90   DOXAZOSIN MESYLATE 8 MG TAB 05/13/2021 90   LABETALOL HCL 200 MG TABLET 05/15/2021 90   LEVOTHYROXINE 125 MCG TABLET 05/13/2021 30   OMEPRAZOLE DR 40 MG CAPSULE 04/13/2021 90   TORSEMIDE 20 MG TABLET 03/21/2021 90   cloNIDine (CATAPRES) tablet 01/26/2021 90   GLIPIZIDE ER 10 MG TABLET 05/13/2021 45   METFORMIN HCL 1,000 MG TABLET 05/13/2021 90    Recent Relevant Labs: Lab Results  Component Value Date/Time   HGBA1C 6.3 05/12/2021 07:20 AM   HGBA1C 6.0 (A) 01/22/2021 09:22 AM   HGBA1C 5.6 07/29/2020 08:31 AM   HGBA1C 6.3 02/22/2020 09:29 AM   HGBA1C 6.2 10/23/2019 10:15 AM   MICROALBUR <0.7 04/28/2016 07:56 AM   MICROALBUR 0.8 11/02/2013 08:54 AM    Kidney Function Lab Results  Component Value Date/Time   CREATININE 1.15 04/28/2021 09:19 AM   CREATININE 1.08 01/20/2021 08:01 AM   CREATININE 0.98 12/23/2020 09:00 AM   CREATININE 1.13 07/29/2020 08:31 AM   GFR 83.11 07/26/2019 09:21 AM   GFRNONAA >60 04/28/2021 09:19 AM   GFRAA 99 07/03/2020 10:16 AM   GFRAA >60 04/29/2020 09:48 AM    Current antihyperglycemic regimen:  Glipizide 10 mg twice daily  Metformin 1000 mg twice daily  What recent interventions/DTPs have been made to improve glycemic control:  None  Have there been any recent hospitalizations or ED visits since last visit with CPP? No  Patient reports hypoglycemic symptoms, including  light headed on rare occasion, this subsides when he eats something  Patient reports hyperglycemic symptoms, including excessive thirst  How often are you checking your blood sugar? 2-3 times per week  What are your blood sugars ranging?  Fasting: 87-120 After meals: recently had a reading of 250, generally lower  During the week, how often does your blood glucose drop below 70? Never  Are you checking your feet  daily/regularly? Yes  Adherence Review: Is the patient currently on a STATIN medication? Yes Is the patient currently on ACE/ARB medication? No Does the patient have >5 day gap between last estimated fill dates? No   Care Gaps: AWV - scheduled for 11/13/21 Zoster vaccines - never done Ophthalmology exam - overdue Covid-19 vaccine booster 4 - overdue  Flu vaccine - due Last HGA1C 05/12/2021 was 6.3  Star Rating Drugs: Glipizide 10mg  - last [facility] on [facility]/13/21 [facility] Metformin 1000mg  - last filled on 05/13/21 90[facility] at [facility] Atorvastatin 80 mg - last filled 05/04/21 for 90 ds at [place] [phone]

## 2021-06-10 ENCOUNTER — Encounter: Payer: Self-pay | Admitting: Oncology

## 2021-06-10 ENCOUNTER — Other Ambulatory Visit (HOSPITAL_COMMUNITY): Payer: Self-pay

## 2021-06-10 ENCOUNTER — Encounter (HOSPITAL_COMMUNITY): Payer: Self-pay | Admitting: Internal Medicine

## 2021-06-10 ENCOUNTER — Telehealth: Payer: Self-pay

## 2021-06-10 NOTE — Telephone Encounter (Signed)
Oral Oncology Patient Advocate Encounter  Was successful in securing patient a $8000 grant from [employer] to provide copayment coverage for Zytiga.  This will keep the out of pocket expense at $0.     [facility] ID: [id number]  I have spoken with the patient.   The billing information is as follows and has been shared with [place]: [id number] PCN: [id number] Member ID: [id number] Group ID: [id number] Dates of Eligibility: 05/11/21 through 05/10/22  Fund:  [place] Patient [place] Phone [phone] Fax [phone] 06/10/2021 1:09 PM

## 2021-06-12 ENCOUNTER — Other Ambulatory Visit (HOSPITAL_COMMUNITY): Payer: Self-pay

## 2021-06-12 ENCOUNTER — Other Ambulatory Visit: Payer: Self-pay | Admitting: Oncology

## 2021-06-12 DIAGNOSIS — C61 Malignant neoplasm of prostate: Secondary | ICD-10-CM

## 2021-06-12 MED ORDER — ABIRATERONE ACETATE 250 MG PO TABS
ORAL_TABLET | Freq: Every day | ORAL | 0 refills | Status: DC
Start: 2021-06-12 — End: 2021-07-15
  Filled 2021-06-22: qty 120, 30d supply, fill #0

## 2021-06-15 ENCOUNTER — Other Ambulatory Visit: Payer: Self-pay

## 2021-06-15 ENCOUNTER — Other Ambulatory Visit (INDEPENDENT_AMBULATORY_CARE_PROVIDER_SITE_OTHER): Payer: Medicare Other

## 2021-06-15 ENCOUNTER — Telehealth: Payer: Self-pay | Admitting: Pharmacist

## 2021-06-15 ENCOUNTER — Other Ambulatory Visit (HOSPITAL_COMMUNITY): Payer: Self-pay

## 2021-06-15 DIAGNOSIS — E039 Hypothyroidism, unspecified: Secondary | ICD-10-CM

## 2021-06-15 LAB — TSH: TSH: 1.1 u[IU]/mL (ref 0.35–5.50)

## 2021-06-15 NOTE — Chronic Care Management (AMB) (Signed)
This encounter was created in error - please disregard.

## 2021-06-16 ENCOUNTER — Other Ambulatory Visit: Payer: Self-pay | Admitting: Adult Health

## 2021-06-16 DIAGNOSIS — E039 Hypothyroidism, unspecified: Secondary | ICD-10-CM

## 2021-06-16 MED ORDER — LEVOTHYROXINE SODIUM 125 MCG PO TABS: 125.0000 ug | ORAL_TABLET | Freq: Every day | ORAL | 3 refills | Status: DC

## 2021-06-20 ENCOUNTER — Other Ambulatory Visit: Payer: Self-pay | Admitting: Adult Health

## 2021-06-20 DIAGNOSIS — E119 Type 2 diabetes mellitus without complications: Secondary | ICD-10-CM

## 2021-06-22 ENCOUNTER — Other Ambulatory Visit (HOSPITAL_COMMUNITY): Payer: Self-pay

## 2021-06-22 ENCOUNTER — Encounter (HOSPITAL_COMMUNITY): Payer: Self-pay | Admitting: Internal Medicine

## 2021-06-22 ENCOUNTER — Encounter: Payer: Self-pay | Admitting: Oncology

## 2021-06-26 ENCOUNTER — Telehealth: Payer: Self-pay

## 2021-06-26 ENCOUNTER — Encounter: Payer: Self-pay | Admitting: Oncology

## 2021-06-26 ENCOUNTER — Other Ambulatory Visit (HOSPITAL_COMMUNITY): Payer: Self-pay

## 2021-06-26 ENCOUNTER — Encounter (HOSPITAL_COMMUNITY): Payer: Self-pay | Admitting: Internal Medicine

## 2021-06-26 NOTE — Telephone Encounter (Signed)
Oral Oncology Patient Advocate Encounter   Was successful in securing patient an [phone] grant from Patient [facility]) to provide copayment coverage for Zytiga.  This will keep the out of pocket expense at $0.     I have spoken with the patient.    The billing information is as follows and has been shared with [place].   Member ID: [id number] Group ID: [id number] RxBin: [id number] Dates of Eligibility: 05/29/21 through 05/28/22  Fund:  [place] Patient [place] Phone [phone] Fax [phone] 06/26/2021 1:22 PM

## 2021-07-03 ENCOUNTER — Encounter (HOSPITAL_COMMUNITY): Payer: Self-pay | Admitting: Internal Medicine

## 2021-07-03 ENCOUNTER — Encounter: Payer: Self-pay | Admitting: Oncology

## 2021-07-15 ENCOUNTER — Other Ambulatory Visit: Payer: Self-pay | Admitting: Oncology

## 2021-07-15 ENCOUNTER — Other Ambulatory Visit (HOSPITAL_COMMUNITY): Payer: Self-pay

## 2021-07-15 DIAGNOSIS — C61 Malignant neoplasm of prostate: Secondary | ICD-10-CM

## 2021-07-15 MED ORDER — ABIRATERONE ACETATE 250 MG PO TABS
ORAL_TABLET | Freq: Every day | ORAL | 0 refills | Status: DC
  Filled 2021-07-24: qty 120, 30d supply, fill #0

## 2021-07-16 ENCOUNTER — Other Ambulatory Visit: Payer: Self-pay | Admitting: Nurse Practitioner

## 2021-07-24 ENCOUNTER — Other Ambulatory Visit (HOSPITAL_COMMUNITY): Payer: Self-pay

## 2021-07-25 LAB — HM HEPATITIS C SCREENING LAB: HM Hepatitis Screen: NEGATIVE

## 2021-07-30 ENCOUNTER — Encounter: Payer: Self-pay | Admitting: Adult Health

## 2021-07-30 ENCOUNTER — Ambulatory Visit (INDEPENDENT_AMBULATORY_CARE_PROVIDER_SITE_OTHER): Payer: Medicare Other | Admitting: Adult Health

## 2021-07-30 VITALS — BP 124/54 | HR 65 | Temp 98.0°F | Ht 68.0 in | Wt 199.6 lb

## 2021-07-30 DIAGNOSIS — Z23 Encounter for immunization: Secondary | ICD-10-CM | POA: Diagnosis not present

## 2021-07-30 DIAGNOSIS — E782 Mixed hyperlipidemia: Secondary | ICD-10-CM | POA: Diagnosis not present

## 2021-07-30 DIAGNOSIS — I10 Essential (primary) hypertension: Secondary | ICD-10-CM

## 2021-07-30 DIAGNOSIS — I5032 Chronic diastolic (congestive) heart failure: Secondary | ICD-10-CM

## 2021-07-30 DIAGNOSIS — E039 Hypothyroidism, unspecified: Secondary | ICD-10-CM | POA: Diagnosis not present

## 2021-07-30 DIAGNOSIS — E1169 Type 2 diabetes mellitus with other specified complication: Secondary | ICD-10-CM

## 2021-07-30 DIAGNOSIS — I700 Atherosclerosis of aorta: Secondary | ICD-10-CM

## 2021-07-30 DIAGNOSIS — C61 Malignant neoplasm of prostate: Secondary | ICD-10-CM | POA: Diagnosis not present

## 2021-07-30 LAB — CBC WITH DIFFERENTIAL/PLATELET
Basophils Absolute: 0 10*3/uL (ref 0.0–0.1)
Basophils Relative: 0.7 % (ref 0.0–3.0)
Eosinophils Absolute: 0.1 10*3/uL (ref 0.0–0.7)
Eosinophils Relative: 2.3 % (ref 0.0–5.0)
HCT: 30.4 % — ABNORMAL LOW (ref 39.0–52.0)
Hemoglobin: 9.8 g/dL — ABNORMAL LOW (ref 13.0–17.0)
Lymphocytes Relative: 36.2 % (ref 12.0–46.0)
Lymphs Abs: 2.3 10*3/uL (ref 0.7–4.0)
MCHC: 32.3 g/dL (ref 30.0–36.0)
MCV: 84.8 fl (ref 78.0–100.0)
Monocytes Absolute: 0.5 10*3/uL (ref 0.1–1.0)
Monocytes Relative: 8.3 % (ref 3.0–12.0)
Neutro Abs: 3.3 10*3/uL (ref 1.4–7.7)
Neutrophils Relative %: 52.5 % (ref 43.0–77.0)
Platelets: 188 10*3/uL (ref 150.0–400.0)
RBC: 3.58 Mil/uL — ABNORMAL LOW (ref 4.22–5.81)
RDW: 15.3 % (ref 11.5–15.5)
WBC: 6.3 10*3/uL (ref 4.0–10.5)

## 2021-07-30 LAB — COMPREHENSIVE METABOLIC PANEL
ALT: 23 U/L (ref 0–53)
AST: 21 U/L (ref 0–37)
Albumin: 3.8 g/dL (ref 3.5–5.2)
Alkaline Phosphatase: 55 U/L (ref 39–117)
BUN: 24 mg/dL — ABNORMAL HIGH (ref 6–23)
CO2: 28 mEq/L (ref 19–32)
Calcium: 8.9 mg/dL (ref 8.4–10.5)
Chloride: 110 mEq/L (ref 96–112)
Creatinine, Ser: 1.22 mg/dL (ref 0.40–1.50)
GFR: 58.63 mL/min — ABNORMAL LOW (ref >60.00)
Glucose, Bld: 113 mg/dL — ABNORMAL HIGH (ref 70–99)
Potassium: 4.5 mEq/L (ref 3.5–5.1)
Sodium: 144 mEq/L (ref 135–145)
Total Bilirubin: 0.5 mg/dL (ref 0.2–1.2)
Total Protein: 5.6 g/dL — ABNORMAL LOW (ref 6.0–8.3)

## 2021-07-30 LAB — TSH: TSH: 1.51 u[IU]/mL (ref 0.35–5.50)

## 2021-07-30 LAB — LIPID PANEL
Cholesterol: 133 mg/dL (ref 0–200)
HDL: 62 mg/dL (ref >39.00)
LDL Cholesterol: 56 mg/dL (ref 0–99)
NonHDL: 70.96
Total CHOL/HDL Ratio: 2
Triglycerides: 75 mg/dL (ref 0.0–149.0)
VLDL: 15 mg/dL (ref 0.0–40.0)

## 2021-07-30 LAB — HEMOGLOBIN A1C: Hgb A1c MFr Bld: 6.2 % (ref 4.6–6.5)

## 2021-07-30 LAB — PSA: PSA: 0 ng/mL — ABNORMAL LOW (ref 0.10–4.00)

## 2021-07-30 NOTE — Progress Notes (Signed)
Subjective:    Patient ID: Matthew Serrano., male    DOB: March 07, 1946, 75 y.o.   MRN: [id number]  HPI Patient presents for yearly preventative medicine examination. 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   DM Type II -maintained on glipizide 10 mg ER twice daily and metformin 1000 mg twice daily.  He does check his blood sugars twice a week with fasting readings in the 80s to 110s and postprandial in the 140s to 180s. Lab Results  Component Value Date   HGBA1C 6.3 05/12/2021   Hypertension-prescribed doxazosin 8 mg daily,  Labetalol  600 mg in the morning and 400 mg in the evening as well as torsemide 20 mg twice daily.  He denies dizziness, lightheadedness, chest pain, or shortness of breath.  He does check his blood sugars at home with readings in the 120s to 130s over 70s. BP Readings from Last 3 Encounters:  07/30/21 (!) 124/54  05/12/21 138/84  04/28/21 122/62   Hypothyroidism -takes Synthroid 150 mcg daily.  Does control on dose. Lab Results  Component Value Date   TSH 1.10 06/15/2021   Hyperlipidemia-current treatment includes atorvastatin 80 mg daily. Lab Results  Component Value Date   CHOL 153 07/29/2020   HDL 48 07/29/2020   LDLCALC 82 07/29/2020   LDLDIRECT 105.0 04/28/2016   TRIG 129 07/29/2020   CHOLHDL 3.2 07/29/2020   CAD-atorvastatin 80 mg daily and asa 8 1mg  daily. Denies myalgia, fatigue, or bruising.   CHF-takes  torsemide 20 mg twice daily and K-Dur 20 mEq 2 tabs by mouth twice daily.  Last echo in May 2022 showed an ejection fraction of 55 to 60%.  History of prostate cancer-current treatment includes Zytiga 250 mg 4 tablets daily and prednisone 5 mg daily.  GERD-takes omeprazole 40 mg daily.  Does feel well controlled on this medication  All immunizations and health maintenance protocols were reviewed with the patient and needed orders were placed.  Appropriate screening laboratory values were ordered for the patient including screening of hyperlipidemia, renal function and hepatic function. If indicated by BPH, a PSA was ordered.  Medication reconciliation,  past medical history, social history, problem list and allergies were reviewed in detail with the patient  Goals were established with regard to weight loss, exercise, and  diet in compliance with medications. Does not have a structured exercise program  Wt Readings from Last 3 Encounters:  07/30/21 199 lb 9.6 oz (90.5 kg)  05/12/21 192 lb (87.1 kg)  04/28/21 188 lb (85.3 kg)    Review of Systems  Constitutional: Negative.   HENT: Negative.    Eyes: Negative.   Respiratory: Negative.    Cardiovascular: Negative.   Gastrointestinal: Negative.   Endocrine: Negative.   Genitourinary: Negative.   Musculoskeletal: Negative.   Skin: Negative.   Allergic/Immunologic: Negative.   Neurological: Negative.   Hematological: Negative.   Psychiatric/Behavioral: Negative.    All other systems reviewed and are negative.  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[place]) 05/14/2020   History of nuclear stress test    Myoview 8/18: EF 64, inferobasal thinning, no  ischemia, low risk   HYPERLIPIDEMIA 07/06/2007   HYPERTENSION 07/06/2007   HYPOTHYROIDISM 07/06/2007   Leg pain    ABIs 8/18:  Normal    Obesity    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   23 mm human aortic root graft with 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 and reimplantation of left main and right coronary arteries   Severe aortic insufficiency 05/13/2020   acute onset in setting of bacterial endocarditis   Severe aortic stenosis     Social History   Socioeconomic History   Marital status: Widowed    Spouse name: Not on file   Number of children: Not on file   Years of education: Not on file   Highest education level: Not on file  Occupational History   Not on file  Tobacco Use   Smoking status: Former   Smokeless tobacco: Never   Tobacco comments:    quit 2005  Vaping Use   Vaping Use: Never used  Substance and Sexual Activity   Alcohol use: Not Currently    Alcohol/week: 21.0 standard drinks    Types: 21 Cans of beer per week   Drug use: No   Sexual activity: Not on file  Other Topics Concern   Not on file  Social History Narrative   He works as a Freight forwarder    Not married    No kids          Social Determinants of Radio broadcast assistant Strain: Low Risk    Difficulty of Paying Living Expenses: Not hard at all  Food Insecurity: No Food Insecurity   Worried About Charity fundraiser in the Last Year: Never true   Arboriculturist in the Last Year: Never true  Transportation Needs: No Transportation Needs   Lack of Transportation (Medical): No   Lack of Transportation (Non-Medical): No  Physical Activity: Inactive   Days of Exercise per Week: 0 days   Minutes of Exercise per Session: 0 min  Stress: No Stress Concern Present   Feeling of Stress : Not at all  Social Connections: Socially Isolated   Frequency of Communication with Friends and Family: More than three times a week   Frequency of Social Gatherings with  Friends and Family: More than three times a week   Attends Religious Services: Never   Marine scientist or Organizations: No   Attends Archivist Meetings: Never   Marital Status: Widowed  Human resources officer Violence: Not At Risk   Fear of Current or Ex-Partner: No   Emotionally Abused: No   Physically Abused: No   Sexually Abused: No    Past Surgical History:  Procedure Laterality Date   AORTIC VALVE REPLACEMENT N/A 05/15/2020   Procedure: AORTIC VALVE REPLACEMENT (AVR) SIZE 23 MM,  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;  Surgeon: Rexene Alberts, MD;  Location: [place];  Service: Open Heart Surgery;  Laterality: N/A;   ASCENDING AORTIC ROOT REPLACEMENT N/A 05/15/2020   Procedure: HUMAN ALLOGRAFT AORTIC ROOT REPLACEMENT;  Surgeon: Rexene Alberts, MD;  Location: [place];  Service: Open Heart Surgery;  Laterality: N/A;  COLONOSCOPY     COLONOSCOPY N/A 05/03/2020   Procedure: COLONOSCOPY;  Surgeon: Juanita Craver, MD;  Location: [facility];  Service: Endoscopy;  Laterality: N/A;   COLONOSCOPY W/ BIOPSIES AND POLYPECTOMY     ENTEROSCOPY N/A 05/02/2020   Procedure: ENTEROSCOPY;  Surgeon: Rush Landmark Telford Nab., MD;  Location: [facility];  Service: Gastroenterology;  Laterality: N/A;   HEMOSTASIS CLIP PLACEMENT  05/03/2020   Procedure: HEMOSTASIS CLIP PLACEMENT;  Surgeon: Juanita Craver, MD;  Location: [facility];  Service: Endoscopy;;   HOT HEMOSTASIS N/A 05/03/2020   Procedure: HOT HEMOSTASIS (ARGON PLASMA COAGULATION/BICAP);  Surgeon: Juanita Craver, MD;  Location: [facility];  Service: Endoscopy;  Laterality: N/A;   IR IMAGING GUIDED PORT INSERTION  07/05/2018   POLYPECTOMY     PORT-A-CATH REMOVAL Right 05/20/2020   Procedure: REMOVAL PORT-A-CATH;  Surgeon: Rexene Alberts, MD;  [facility];  Service: Thoracic;  Laterality: Right;   RIGHT/LEFT HEART CATH AND CORONARY ANGIOGRAPHY N/A 02/13/2020   Procedure: RIGHT/LEFT HEART CATH AND CORONARY ANGIOGRAPHY;   Surgeon: Belva Crome, MD;  Location: [place] [facility];  Service: Cardiovascular;  Laterality: N/A;   SUBMUCOSAL INJECTION  05/02/2020   Procedure: SUBMUCOSAL INJECTION;  Surgeon: Rush Landmark Telford Nab., MD;  Location: [facility];  Service: Gastroenterology;;   TEE WITHOUT CARDIOVERSION N/A 05/14/2020   Procedure: TRANSESOPHAGEAL ECHOCARDIOGRAM (TEE);  Surgeon: Sanda Klein, MD;  Location: [facility];  Service: Cardiovascular;  Laterality: N/A;   TEE WITHOUT CARDIOVERSION N/A 05/15/2020   Procedure: TRANSESOPHAGEAL ECHOCARDIOGRAM (TEE);  Surgeon: Rexene Alberts, MD;  Location: [place];  Service: Open Heart Surgery;  Laterality: N/A;    Family History  Problem Relation Age of Onset   Hypertension Other    Breast cancer Sister    Breast cancer Other        family history   Breast cancer Brother        prostate ca   Aneurysm Mother        Brain aneurysm    Prostate cancer Father    Colon cancer Neg Hx    Esophageal cancer Neg Hx    Stomach cancer Neg Hx    Rectal cancer Neg Hx     No Known Allergies  Current Outpatient Medications on File Prior to Visit  Medication Sig Dispense Refill   abiraterone acetate (ZYTIGA) 250 MG tablet TAKE 4 TABLETS (1,000 MG TOTAL) BY MOUTH DAILY. TAKE ON AN EMPTY STOMACH 1 HOUR BEFORE OR 2 HOURS AFTER A MEAL 120 tablet 0   amoxicillin (AMOXIL) 500 MG tablet Take 4 tablets (2,000 mg total) 1 hour prior to all dental visits. 12 tablet 12   aspirin EC 81 MG tablet Take 81 mg by mouth daily. Swallow whole.     atorvastatin (LIPITOR) 80 MG tablet Take 1 tablet (80 mg total) by mouth daily. 90 tablet 2   cholecalciferol (VITAMIN D3) 25 MCG (1000 UT) tablet Take 1,000 Units by mouth daily.     cloNIDine (CATAPRES) 0.2 MG tablet      doxazosin (CARDURA) 8 MG tablet Take 1 tablet (8 mg total) by mouth at bedtime. 90 tablet 3   ferrous sulfate 325 (65 FE) MG EC tablet Take 1 tablet (325 mg total) by mouth 2 (two) times daily before a meal. 60 tablet 2    glipiZIDE (GLUCOTROL XL) 10 MG 24 hr tablet TAKE 1 TABLET TWICE A DAY 90 tablet 0   labetalol (NORMODYNE) 200 MG tablet TAKE 3 TABLETS EVERY MORNING  AND TAKE 2  TABLETS EVERY EVENING 450 tablet 3   levothyroxine (SYNTHROID) 125 MCG tablet Take 1 tablet (125 mcg total) by mouth daily. 90 tablet 3   metFORMIN (GLUCOPHAGE) 1000 MG tablet Take 1 tablet (1,000 mg total) by mouth 2 (two) times daily with a meal. 180 tablet 0   omeprazole (PRILOSEC) 40 MG capsule TAKE 1 CAPSULE BY MOUTH EVERY DAY 90 capsule 1   potassium chloride SA (KLOR-CON) 20 MEQ tablet Take 2 tablets (40 mEq total) by mouth 2 (two) times daily. 360 tablet 3   predniSONE (DELTASONE) 5 MG tablet TAKE 1 TABLET BY MOUTH ONCE A DAY WITH BREAKFAST 90 tablet 3   torsemide (DEMADEX) 20 MG tablet Take 1 tablet (20 mg total) by mouth 2 (two) times daily. 180 tablet 3   No current facility-administered medications on file prior to visit.    BP (!) 124/54 (BP Location: Left Arm, Patient Position: Sitting, Cuff Size: Normal)   Pulse 65   Temp 98 F (36.7 C) (Oral)   Ht 5\' 8"  (1.727 m)   Wt 199 lb 9.6 oz (90.5 kg)   SpO2 99%   BMI 30.35 kg/m        Objective:   Physical Exam Vitals and nursing note reviewed.  Constitutional:      General: He is not in acute distress.    Appearance: Normal appearance. He is well-developed. He is obese.  HENT:     Head: Normocephalic and atraumatic.     Right Ear: Tympanic membrane, ear canal and external ear normal. There is no impacted cerumen.     Left Ear: Tympanic membrane, ear canal and external ear normal. There is no impacted cerumen.     Nose: Nose normal. No congestion or rhinorrhea.     Mouth/Throat:     Mouth: Mucous membranes are moist.     Dentition: Has dentures.     Pharynx: Oropharynx is clear. No oropharyngeal exudate or posterior oropharyngeal erythema.  Eyes:     General:        Right eye: No discharge.        Left eye: No discharge.     Extraocular Movements:  Extraocular movements intact.     Conjunctiva/sclera: Conjunctivae normal.     Pupils: Pupils are equal, round, and reactive to light.  Neck:     Vascular: No carotid bruit.     Trachea: No tracheal deviation.  Cardiovascular:     Rate and Rhythm: Normal rate and regular rhythm.     Pulses: Normal pulses.     Heart sounds: Normal heart sounds. No murmur heard.   No friction rub. No gallop.  Pulmonary:     Effort: Pulmonary effort is normal. No respiratory distress.     Breath sounds: Normal breath sounds. No stridor. No wheezing, rhonchi or rales.  Chest:     Chest wall: No tenderness.     Comments: Surgical scar on chest  Abdominal:     General: Bowel sounds are normal. There is no distension.     Palpations: Abdomen is soft. There is no mass.     Tenderness: There is no abdominal tenderness. There is no right CVA tenderness, left CVA tenderness, guarding or rebound.     Hernia: No hernia is present.  Musculoskeletal:        General: No swelling, tenderness, deformity or signs of injury. Normal range of motion.     Right lower leg: No edema.     Left lower leg: No edema.  Lymphadenopathy:     Cervical: No cervical adenopathy.  Skin:    General: Skin is warm and dry.     Capillary Refill: Capillary refill takes less than 2 seconds.     Coloration: Skin is not jaundiced or pale.     Findings: No bruising, erythema, lesion or rash.  Neurological:     General: No focal deficit present.     Mental Status: He is alert and oriented to person, place, and time.     Cranial Nerves: No cranial nerve deficit.     Sensory: No sensory deficit.     Motor: No weakness.     Coordination: Coordination normal.     Gait: Gait normal.     Deep Tendon Reflexes: Reflexes normal.  Psychiatric:        Mood and Affect: Mood normal.        Behavior: Behavior normal.        Thought Content: Thought content normal.        Judgment: Judgment normal.      Assessment & Plan:  1. Hypothyroidism,  unspecified type - Consider dose increase in synthroid - CBC with Differential/Platelet; Future - Comprehensive metabolic panel; Future - Hemoglobin A1c; Future - Lipid panel; Future - TSH; Future  2. Mixed hyperlipidemia - Consider adding Zetia  - CBC with Differential/Platelet; Future - Comprehensive metabolic panel; Future - Hemoglobin A1c; Future - Lipid panel; Future - TSH; Future  3. Essential hypertension - Controlled. No change in medications  - CBC with Differential/Platelet; Future - Comprehensive metabolic panel; Future - Hemoglobin A1c; Future - Lipid panel; Future - TSH; Future  4. Type 2 diabetes mellitus with other specified complication, without long-term current use of insulin ([place]) - Consider adding agent - CBC with Differential/Platelet; Future - Comprehensive metabolic panel; Future - Hemoglobin A1c; Future - Lipid panel; Future - TSH; Future   5. Malignant neoplasm of prostate ([place]) - Follow up with Urology and Oncology as directed - PSA; Future  6. Atherosclerosis of aorta ([facility]) - Continue with Statin and ASA - CBC with Differential/Platelet; Future - Comprehensive metabolic panel; Future - Hemoglobin A1c; Future - Lipid panel; Future - TSH; Future  7. Chronic diastolic congestive heart failure ([place]) - Continue with Torsemide and Kdur - Follow up with Cardiology as directed - CBC with Differential/Platelet; Future - Comprehensive metabolic panel; Future - Hemoglobin A1c; Future - Lipid panel; Future - TSH; Future  8. Need for immunization against influenza  - Flu Vaccine QUAD High Dose(Fluad)  [name], NP

## 2021-07-30 NOTE — Addendum Note (Signed)
Addended by: Octavio Manns E on: 07/30/2021 07:36 AM   Modules accepted: Orders

## 2021-07-30 NOTE — Patient Instructions (Signed)
It was great seeing you today   We will follow up with you regarding your blood work   I will likely see you back in 6 months for follow up.   If you need anything in the meantime, let me know

## 2021-08-05 ENCOUNTER — Encounter: Payer: Self-pay | Admitting: Adult Health

## 2021-08-05 ENCOUNTER — Other Ambulatory Visit: Payer: Self-pay | Admitting: Adult Health

## 2021-08-05 DIAGNOSIS — E119 Type 2 diabetes mellitus without complications: Secondary | ICD-10-CM

## 2021-08-09 ENCOUNTER — Other Ambulatory Visit: Payer: Self-pay | Admitting: Adult Health

## 2021-08-09 DIAGNOSIS — E119 Type 2 diabetes mellitus without complications: Secondary | ICD-10-CM

## 2021-08-12 ENCOUNTER — Other Ambulatory Visit: Payer: Self-pay | Admitting: Oncology

## 2021-08-12 ENCOUNTER — Other Ambulatory Visit (HOSPITAL_COMMUNITY): Payer: Self-pay

## 2021-08-12 DIAGNOSIS — C61 Malignant neoplasm of prostate: Secondary | ICD-10-CM

## 2021-08-12 MED ORDER — ABIRATERONE ACETATE 250 MG PO TABS
ORAL_TABLET | Freq: Every day | ORAL | 0 refills | Status: DC
  Filled 2021-08-14: qty 120, 30d supply, fill #0

## 2021-08-14 ENCOUNTER — Other Ambulatory Visit (HOSPITAL_COMMUNITY): Payer: Self-pay

## 2021-08-19 ENCOUNTER — Other Ambulatory Visit (HOSPITAL_COMMUNITY): Payer: Self-pay

## 2021-08-23 ENCOUNTER — Encounter (HOSPITAL_COMMUNITY): Payer: Self-pay | Admitting: Internal Medicine

## 2021-08-23 ENCOUNTER — Encounter: Payer: Self-pay | Admitting: Oncology

## 2021-08-28 ENCOUNTER — Inpatient Hospital Stay: Payer: Medicare PPO

## 2021-08-28 ENCOUNTER — Other Ambulatory Visit: Payer: Self-pay

## 2021-08-28 ENCOUNTER — Inpatient Hospital Stay: Payer: Medicare PPO | Attending: Oncology

## 2021-08-28 ENCOUNTER — Inpatient Hospital Stay (HOSPITAL_BASED_OUTPATIENT_CLINIC_OR_DEPARTMENT_OTHER): Payer: Medicare PPO | Admitting: Oncology

## 2021-08-28 VITALS — BP 140/72 | HR 64 | Temp 97.9°F | Resp 18 | Ht 68.0 in | Wt 200.7 lb

## 2021-08-28 DIAGNOSIS — C61 Malignant neoplasm of prostate: Secondary | ICD-10-CM | POA: Insufficient documentation

## 2021-08-28 DIAGNOSIS — Z79899 Other long term (current) drug therapy: Secondary | ICD-10-CM | POA: Diagnosis not present

## 2021-08-28 DIAGNOSIS — E876 Hypokalemia: Secondary | ICD-10-CM | POA: Insufficient documentation

## 2021-08-28 LAB — CMP (CANCER CENTER ONLY)
ALT: 22 U/L (ref 0–44)
AST: 21 U/L (ref 15–41)
Albumin: 3.9 g/dL (ref 3.5–5.0)
Alkaline Phosphatase: 58 U/L (ref 38–126)
Anion gap: 10 (ref 5–15)
BUN: 24 mg/dL — ABNORMAL HIGH (ref 8–23)
CO2: 31 mmol/L (ref 22–32)
Calcium: 8.9 mg/dL (ref 8.9–10.3)
Chloride: 104 mmol/L (ref 98–111)
Creatinine: 1.17 mg/dL (ref 0.61–1.24)
GFR, Estimated: >60 mL/min (ref >60)
Glucose, Bld: 98 mg/dL (ref 70–99)
Potassium: 4 mmol/L (ref 3.5–5.1)
Sodium: 145 mmol/L (ref 135–145)
Total Bilirubin: 0.6 mg/dL (ref 0.3–1.2)
Total Protein: 6.2 g/dL — ABNORMAL LOW (ref 6.5–8.1)

## 2021-08-28 LAB — CBC WITH DIFFERENTIAL (CANCER CENTER ONLY)
Abs Immature Granulocytes: 0.02 10*3/uL (ref 0.00–0.07)
Basophils Absolute: 0.1 10*3/uL (ref 0.0–0.1)
Basophils Relative: 1 %
Eosinophils Absolute: 0.1 10*3/uL (ref 0.0–0.5)
Eosinophils Relative: 1 %
HCT: 33.7 % — ABNORMAL LOW (ref 39.0–52.0)
Hemoglobin: 10.5 g/dL — ABNORMAL LOW (ref 13.0–17.0)
Immature Granulocytes: 0 %
Lymphocytes Relative: 30 %
Lymphs Abs: 2.1 10*3/uL (ref 0.7–4.0)
MCH: 26.7 pg (ref 26.0–34.0)
MCHC: 31.2 g/dL (ref 30.0–36.0)
MCV: 85.8 fL (ref 80.0–100.0)
Monocytes Absolute: 0.6 10*3/uL (ref 0.1–1.0)
Monocytes Relative: 8 %
Neutro Abs: 4.3 10*3/uL (ref 1.7–7.7)
Neutrophils Relative %: 60 %
Platelet Count: 190 10*3/uL (ref 150–400)
RBC: 3.93 MIL/uL — ABNORMAL LOW (ref 4.22–5.81)
RDW: 14.9 % (ref 11.5–15.5)
WBC Count: 7 10*3/uL (ref 4.0–10.5)
nRBC: 0 % (ref 0.0–0.2)

## 2021-08-28 MED ORDER — LEUPROLIDE ACETATE (4 MONTH) 30 MG ~~LOC~~ KIT
30.0000 mg | PACK | Freq: Once | SUBCUTANEOUS | Status: AC
  Administered 2021-08-28: 30 mg via SUBCUTANEOUS
  Filled 2021-08-28: qty 30

## 2021-08-28 NOTE — Progress Notes (Signed)
Hematology and Oncology Follow Up Visit  Tuan Tippin [id number] 1946-05-26 76 y.o. 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   Prior Therapy:  Status post prostate biopsy obtained on 06/06/2018 which showed high-volume disease with a Gleason score 4+5 = 9.  Taxotere chemotherapy at 75 mg/m every 3 weeks started on 07/07/2018.  He he is here for cycle 6 of therapy.  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  Current therapy:   Eligard 30 mg every 4 months.  Next injection will be given today.  Zytiga 1000 mg daily started in April 2020.  Interim History: Mr. Stapel returns today for a follow-up visit.  Since last visit, he reports no major changes in his health.  He denies any recent hospitalizations or illnesses.  He denies any bone pain or pathological fractures.  He denies any decline in energy or performance status.  He tolerates Zytiga very well and continues to be active.  He denies any worsening edema.                   Medications: Updated on review. Current Outpatient Medications  Medication Sig Dispense Refill   abiraterone acetate (ZYTIGA) 250 MG tablet TAKE 4 TABLETS (1,000 MG TOTAL) BY MOUTH DAILY. TAKE ON AN EMPTY STOMACH 1 HOUR BEFORE OR 2 HOURS AFTER A MEAL 120 tablet 0   amoxicillin (AMOXIL) 500 MG tablet Take 4 tablets (2,000 mg total) 1 hour prior to all dental visits. 12 tablet 12   aspirin EC 81 MG tablet Take 81 mg by mouth daily. Swallow whole.     atorvastatin (LIPITOR) 80 MG tablet Take 1 tablet (80 mg total) by mouth daily. 90 tablet 2   cholecalciferol (VITAMIN D3) 25 MCG (1000 UT) tablet Take 1,000 Units by mouth daily.     cloNIDine (CATAPRES) 0.2 MG tablet      doxazosin  (CARDURA) 8 MG tablet Take 1 tablet (8 mg total) by mouth at bedtime. 90 tablet 3   ferrous sulfate 325 (65 FE) MG EC tablet Take 1 tablet (325 mg total) by mouth 2 (two) times daily before a meal. 60 tablet 2   glipiZIDE (GLUCOTROL XL) 10 MG 24 hr tablet TAKE 1 TABLET BY MOUTH TWICE A DAY 90 tablet 0   labetalol (NORMODYNE) 200 MG tablet TAKE 3 TABLETS EVERY MORNING  AND TAKE 2 TABLETS EVERY EVENING 450 tablet 3   levothyroxine (SYNTHROID) 125 MCG tablet Take 1 tablet (125 mcg total) by mouth daily. 90 tablet 3   metFORMIN (GLUCOPHAGE) 1000 MG tablet TAKE 1 TABLET (1,000 MG TOTAL) BY MOUTH 2 (TWO) TIMES DAILY WITH A MEAL. 180 tablet 0   omeprazole (PRILOSEC) 40 MG capsule TAKE 1 CAPSULE BY MOUTH EVERY DAY 90 capsule 1   potassium chloride SA (KLOR-CON) 20 MEQ tablet Take 2 tablets (40 mEq total) by mouth 2 (two) times daily. 360 tablet 3   predniSONE (DELTASONE) 5 MG tablet TAKE 1 TABLET BY MOUTH ONCE A DAY WITH BREAKFAST 90 tablet 3   torsemide (DEMADEX) 20 MG tablet Take 1 tablet (20 mg total) by mouth 2 (two) times daily. 180 tablet 3   No current facility-administered medications for this visit.     Allergies: No Known Allergies      Physical  Exam:    Blood pressure 140/72, pulse 64, temperature 97.9 F (36.6 C), temperature source Temporal, resp. rate 18, height 5\' 8"  (1.727 m), weight 200 lb 11.2 oz (91 kg), SpO2 100 %.        ECOG: 0    General appearance: Comfortable appearing without any discomfort Head: Normocephalic without any trauma Oropharynx: Mucous membranes are moist and pink without any thrush or ulcers. Eyes: Pupils are equal and round reactive to light. Lymph nodes: No cervical, supraclavicular, inguinal or axillary lymphadenopathy.   Heart:regular rate and rhythm.  S1 and S2 without leg edema. Lung: Clear without any rhonchi or wheezes.  No dullness to percussion. Abdomin: Soft, nontender, nondistended with good bowel sounds.  No  hepatosplenomegaly. Musculoskeletal: No joint deformity or effusion.  Full range of motion noted. Neurological: No deficits noted on motor, sensory and deep tendon reflex exam. Skin: No petechial rash or dryness.  Appeared moist.               Lab Results: Lab Results  Component Value Date   WBC 7.0 08/28/2021   HGB 10.5 (L) 08/28/2021   HCT 33.7 (L) 08/28/2021   MCV 85.8 08/28/2021   PLT 190 08/28/2021     Chemistry      Component Value Date/Time   NA 144 07/30/2021 0736   NA 141 01/20/2021 0801   K 4.5 07/30/2021 0736   CL 110 07/30/2021 0736   CO2 28 07/30/2021 0736   BUN 24 (H) 07/30/2021 0736   BUN 30 (H) 01/20/2021 0801   CREATININE 1.22 07/30/2021 0736   CREATININE 1.15 04/28/2021 0919   CREATININE 1.13 07/29/2020 0831      Component Value Date/Time   CALCIUM 8.9 07/30/2021 0736   ALKPHOS 55 07/30/2021 0736   AST 21 07/30/2021 0736   AST 22 04/28/2021 0919   ALT 23 07/30/2021 0736   ALT 27 04/28/2021 0919   BILITOT 0.5 07/30/2021 0736   BILITOT 0.7 04/28/2021 0919           Latest Reference Range & Units 12/23/20 09:00 04/28/21 09:19  Prostate Specific Ag, Serum 0.0 - 4.0 ng/mL <0.1 <0.1             Impression and Plan:  76 year old man with:   1.  Advanced prostate cancer with lymphadenopathy diagnosed in 2019.  He developed castration-resistant subsequently.  He continues to tolerate Zytiga without any major complaints at this time.  He continues to have excellent PSA response that is currently undetectable.  Alternative treatment options such as or chemotherapy or lutetium 117 could be used if he developed disease progression in the future.  2.  Androgen deprivation: He will receive Eligard today and repeated in 4 months.  Complications including weight gain, hot flashes and sexual dysfunction were reiterated.   3.  Goals of care and prognosis: Therapy remains palliative.  Aggressive measures are warranted at this time.  His  performance status remains excellent.  4.  Hypokalemia: His potassium continues to be within normal range and will continue to monitor on Zytiga.    5.  Follow-up: He will return in 4 months for a follow-up visit.   30  minutes were dedicated to this visit.  Time was spent on reviewing disease status, treatment choices and addressing complication related to cancer and cancer therapy.        [name], MD 1/6/20232:45 PM      And

## 2021-08-29 LAB — PROSTATE-SPECIFIC AG, SERUM (LABCORP): Prostate Specific Ag, Serum: <0.1 ng/mL (ref 0.0–4.0)

## 2021-09-01 ENCOUNTER — Encounter: Payer: Self-pay | Admitting: Oncology

## 2021-09-01 ENCOUNTER — Encounter (HOSPITAL_COMMUNITY): Payer: Self-pay | Admitting: Internal Medicine

## 2021-09-08 ENCOUNTER — Telehealth: Payer: Self-pay | Admitting: Pharmacist

## 2021-09-08 NOTE — Chronic Care Management (AMB) (Addendum)
° ° °  Chronic Care Management Pharmacy Assistant   Name: Matthew Serrano.  MRN: [id number] DOB: 24-Dec-1945  09/11/2021 APPOINTMENT [place] No answer, left message of appointment on 09/11/2021 at 10:30 via telephone visit with Jeni Salles, Pharm D. Notified to have all medications, supplements, blood pressure and/or blood sugar logs available during appointment and to return call if need to reschedule.  Care Gaps: AWV - scheduled for 11/13/21 Last BP - 140/72 on 08/28/2021 Last A1C - 6.2 on 07/30/2021 Shingrix - never done Ophthalmology exam - overdue Covid vaccine - overdue  Star Rating Drug: Glipizide 10mg  - last filled on 08/05/2021 [facility] Metformin 1000mg  - last filled on 08/12/2021 [facility] at [facility] Atorvastatin 80 mg - last filled 08/18/2021  90 ds at [facility]  Any gaps in medications fill history? No  Gennie [facility]  Catering manager [phone]

## 2021-09-09 ENCOUNTER — Other Ambulatory Visit: Payer: Self-pay

## 2021-09-09 ENCOUNTER — Encounter: Payer: Self-pay | Admitting: Physician Assistant

## 2021-09-09 ENCOUNTER — Ambulatory Visit (INDEPENDENT_AMBULATORY_CARE_PROVIDER_SITE_OTHER): Payer: Medicare PPO | Admitting: Physician Assistant

## 2021-09-09 VITALS — BP 130/82 | HR 60 | Ht 68.0 in | Wt 203.2 lb

## 2021-09-09 DIAGNOSIS — E782 Mixed hyperlipidemia: Secondary | ICD-10-CM | POA: Diagnosis not present

## 2021-09-09 DIAGNOSIS — I2510 Atherosclerotic heart disease of native coronary artery without angina pectoris: Secondary | ICD-10-CM | POA: Diagnosis not present

## 2021-09-09 DIAGNOSIS — I5032 Chronic diastolic (congestive) heart failure: Secondary | ICD-10-CM | POA: Diagnosis not present

## 2021-09-09 DIAGNOSIS — I10 Essential (primary) hypertension: Secondary | ICD-10-CM

## 2021-09-09 DIAGNOSIS — I2729 Other secondary pulmonary hypertension: Secondary | ICD-10-CM | POA: Insufficient documentation

## 2021-09-09 DIAGNOSIS — I779 Disorder of arteries and arterioles, unspecified: Secondary | ICD-10-CM | POA: Insufficient documentation

## 2021-09-09 DIAGNOSIS — I350 Nonrheumatic aortic (valve) stenosis: Secondary | ICD-10-CM | POA: Diagnosis not present

## 2021-09-09 NOTE — Assessment & Plan Note (Signed)
Pressure is well controlled.  He is not certain if he is still taking clonidine.  He will call us to let us know.  Continue doxazosin 8 mg daily, labetalol 600 mg in the morning and 400 mg in the evening.

## 2021-09-09 NOTE — Progress Notes (Signed)
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 Echo 5/22: EF 55-60, Gr 2 DD, RVSP 46, severe LAE, mild MR, AVR ok (trivial AI) Heart failure with preserved ejection fraction  Coronary artery disease  Cath 6/21: mod non-obs CAD  Pulmonary hypertension Diabetes mellitus  Hypertension  Hyperlipidemia Hypothyroidism  Erectile dysfunction Metastatic prostate CA on suppression chemotherapy  Hx of LGI bleed 01/2020 Colo w cecal telangiectasias >> treated w APC S/p PRBC transfusions    Prior CV studies: Echocardiogram 12/24/2020 EF 55-60, no RWMA, mild LVH, GRII DD, normal RVSF, moderately elevated PASP, severe LAE, mild RAE, mild MR AVR with trivial AI and no aortic stenosis, RVSP 46   Echocardiogram 06/24/2020 EF 55-60, mild conc LVH, Gr 2 DD, normal RVSF, mild LAE, trivial MR, AVR functioning normally (mean gradient 5 mmHg)   Cardiac catheterization 02/13/20 LAD mid 50 LCx prox 50 RCA mid 45   Carotid US 02/20/20 Bilat ICA 1-39   ABIs 8/18:   Normal    Nuclear stress test 03/25/17 Probably normal, low risk stress nuclear study with inferobasal thinning but no significant ischemia; EF 64 with normal wall motion.   Abdominal MRA 8/13 1.  Negative for renal artery stenosis or other lesion to suggest renovascular component of hypertension.   Renal Art Duplex 6/13 No RAS; normal caliber abd aorta    History of Present Illness:   Matthew Serrano. is a 76 y.o.  male with the above problem list.  He was last seen by Dr. Burt Knack in May 2022.  He had a follow-up echocardiogram which demonstrated normal EF, moderate diastolic dysfunction, moderate pulmonary hypertension and normally functioning AVR.  He returns for follow-up.  He is here alone.  Since last seen, he has not had significant chest pain, shortness of breath.  He has not had syncope, orthopnea.  He does note increasing lower extremity edema over the past few months.  His left leg is always larger than his right.  He does eat out about 50% of the time.  He usually goes to fast food restaurants.  We discussed limiting this as this does contribute to excess salt in his diet.    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 TAKE ON AN EMPTY STOMACH 1 HOUR BEFORE OR 2 HOURS AFTER A MEAL   amoxicillin (AMOXIL) 500 MG tablet Take 4 tablets (2,000 mg total) 1 hour prior to all dental visits.   aspirin EC 81 MG tablet Take 81 mg  by mouth daily. Swallow whole.   atorvastatin (LIPITOR) 80 MG tablet Take 1 tablet (80 mg total) by mouth daily.   cholecalciferol (VITAMIN D3) 25 MCG (1000 UT) tablet Take 1,000 Units by mouth daily.   cloNIDine (CATAPRES) 0.2 MG tablet Take 0.2 mg by mouth daily.   doxazosin (CARDURA) 8 MG tablet Take 1 tablet (8 mg total) by mouth at bedtime.   ferrous sulfate 325 (65 FE) MG EC tablet Take 1 tablet (325 mg total) by mouth 2 (two) times daily before a meal.   glipiZIDE (GLUCOTROL XL) 10 MG 24 hr tablet TAKE 1 TABLET BY MOUTH TWICE A DAY   labetalol (NORMODYNE) 200 MG tablet TAKE 3 TABLETS EVERY MORNING  AND TAKE 2 TABLETS EVERY EVENING   levothyroxine (SYNTHROID) 125 MCG tablet Take 1 tablet (125 mcg total) by mouth daily.   metFORMIN (GLUCOPHAGE) 1000 MG tablet TAKE 1 TABLET (1,000 MG TOTAL) BY MOUTH 2 (TWO) TIMES DAILY WITH A MEAL.   omeprazole (PRILOSEC) 40 MG capsule TAKE 1 CAPSULE BY MOUTH EVERY DAY   potassium chloride SA (KLOR-CON) 20 MEQ tablet Take 2 tablets (40 mEq total) by mouth 2 (two) times daily.   predniSONE (DELTASONE) 5 MG tablet TAKE 1 TABLET BY MOUTH ONCE A DAY WITH BREAKFAST   torsemide (DEMADEX) 20 MG tablet Take 1 tablet (20 mg total) by mouth 2 (two) times daily.    Allergies:   Patient has no known allergies.   Social History   Tobacco Use   Smoking status: Former   Smokeless tobacco: Never   Tobacco comments:    quit 2005  Vaping Use   Vaping Use: Never used  Substance Use Topics   Alcohol use: Not Currently    Alcohol/week: 21.0 standard drinks    Types: 21 Cans of beer per week   Drug use: No    Family Hx: The patient's family history includes Aneurysm in his mother; Breast cancer in his brother, sister, and another family member; Hypertension in an other family member; Prostate cancer in his father. There is no history of Colon cancer, Esophageal cancer, Stomach cancer, or Rectal cancer.  ROS   EKGs/Labs/Other Test Reviewed:    EKG:  EKG is    ordered today.  The ekg ordered today demonstrates NSR, HR 60, inferior Q waves, nonspecific ST-T wave changes, QTC 462  Recent Labs: 07/30/2021: TSH 1.51 08/28/2021: ALT 22; BUN 24; Creatinine 1.17; Hemoglobin 10.5; Platelet Count 190; Potassium 4.0; Sodium 145   Recent Lipid Panel Recent Labs    07/30/21 0736  CHOL 133  TRIG 75.0  HDL 62.00  VLDL 15.0  LDLCALC 56     Risk Assessment/Calculations:         Physical Exam:    VS:  BP 130/82 (BP Location: Right Arm)    Pulse 60    Ht 5\' 8"  (1.727 m)  Wt 203 lb 3.2 oz (92.2 kg)    SpO2 98%    BMI 30.90 kg/m     Wt Readings from Last 3 Encounters:  09/09/21 203 lb 3.2 oz (92.2 kg)  08/28/21 200 lb 11.2 oz (91 kg)  07/30/21 199 lb 9.6 oz (90.5 kg)    Constitutional:      Appearance: Healthy appearance. Not in distress.  Neck:     Vascular: No JVR. JVD normal.  Pulmonary:     Effort: Pulmonary effort is normal.     Breath sounds: No wheezing. No rales.  Cardiovascular:     Normal rate. Regular rhythm. Normal S1. Normal S2.      Murmurs: There is a grade 4/6 holosystolic murmur at the ULSB.  Edema:    Peripheral edema present.    Pretibial: 2+ edema of the left pretibial area and 1+ edema of the right pretibial area. Abdominal:     Palpations: Abdomen is soft. There is no hepatomegaly.  Skin:    General: Skin is warm and dry.  Neurological:     General: No focal deficit present.     Mental Status: Alert and oriented to person, place and time.     Cranial Nerves: Cranial nerves are intact.        ASSESSMENT & PLAN:   (HFpEF) heart failure with preserved ejection fraction (HCC) Echocardiogram in May 2022 with normal EF, moderate diastolic dysfunction and moderately elevated PASP.  His weight is up slightly since his last visit.  He has had increased leg edema.  His left leg is always worse than his right.  His lungs are clear, neck veins are flat and has not noted any significant change in his breathing.  I suspect he is  mildly volume overloaded.  I have recommended he increase his torsemide to 40 mg in the morning and 20 mg in the evening for 3 days.  He can then resume 20 mg twice daily.  Increase potassium to 60 mEq in the morning and 40 mEq in the evening for 3 days.  He can then resume 40 mEq twice daily.  I have asked him to contact me if his swelling does not improve after 3 days of increased diuretics.  At that point, we could continue higher dose torsemide with close follow-up on renal function and potassium.  Consider the addition of SGLT2 inhibitor and MRA in the future.  Aortic valve disease s/p Bioprosthetic AVR, Ao Root Replacment, Repair of Ao to PA fistula 04/2020 He has a prominent systolic murmur on exam.  This was noted at his last visit with Dr. Burt Knack.  Follow-up echocardiogram demonstrated normally functioning AVR.  Continue SBE prophylaxis.  Follow-up in 6 months.  Essential hypertension Pressure is well controlled.  He is not certain if he is still taking clonidine.  He will call us to let us know.  Continue doxazosin 8 mg daily, labetalol 600 mg in the morning and 400 mg in the evening.  CAD (coronary artery disease) Nonobstructive disease by cardiac catheterization in 2021.  He is not having anginal symptoms.  Continue aspirin 81 mg daily, atorvastatin 80 mg daily.  Hyperlipidemia LDL optimal.  Continue atorvastatin 80 mg daily.         Dispo:  Return in about 6 months (around 03/09/2022) for Routine follow up in 6 months with Dr. Burt Knack or Richardson Dopp, PA-C..   Medication Adjustments/Labs and Tests Ordered: Current medicines are reviewed at length with the patient today.  Concerns regarding  medicines are outlined above.  Tests Ordered: No orders of the defined types were placed in this encounter.  Medication Changes: No orders of the defined types were placed in this encounter.  Signed, Richardson Dopp, PA-C  09/09/2021 2:23 PM    [facility] [address],  [place], [place]  [address] Phone: [phone]; Fax: [phone]

## 2021-09-09 NOTE — Assessment & Plan Note (Signed)
Nonobstructive disease by cardiac catheterization in 2021.  He is not having anginal symptoms.  Continue aspirin 81 mg daily, atorvastatin 80 mg daily.

## 2021-09-09 NOTE — Assessment & Plan Note (Signed)
He has a prominent systolic murmur on exam.  This was noted at his last visit with Dr. Burt Knack.  Follow-up echocardiogram demonstrated normally functioning AVR.  Continue SBE prophylaxis.  Follow-up in 6 months.

## 2021-09-09 NOTE — Patient Instructions (Addendum)
Medication Instructions:    INCREASE torsemide two (2) tablets by mouth ( 40 mg) am and one (1) tablet by mouth ( 20 mg ) in the pm X 3 days than resume one (1) tablet by mouth ( 20 mg) twice daily.  INCREASE K+ three (3) tablets by mouth ( 60 mEq ) am and two ( 2) tablets by mouth ( 40 mEq ) pm X 3 days than resume two (2) tablets by mouth ( 40 mEq ) twice daily.    *If you need a refill on your cardiac medications before your next appointment, please call your pharmacy*   Lab Work:  None ordered.  If you have labs (blood work) drawn today and your tests are completely normal, you will receive your results only by: [place] (if you have MyChart) OR A paper copy in the mail If you have any lab test that is abnormal or we need to change your treatment, we will call you to review the results.   Testing/Procedures:  None ordered    Follow-Up: At [facility], you and your health needs are our priority.  As part of our continuing mission to provide you with exceptional heart care, we have created designated Provider Care Teams.  These Care Teams include your primary Cardiologist (physician) and Advanced Practice Providers (APPs -  Physician Assistants and Nurse Practitioners) who all work together to provide you with the care you need, when you need it.  We recommend signing up for the patient portal called "MyChart".  Sign up information is provided on this After Visit Summary.  MyChart is used to connect with patients for Virtual Visits (Telemedicine).  Patients are able to view lab/test results, encounter notes, upcoming appointments, etc.  Non-urgent messages can be sent to your provider as well.   To learn more about what you can do with MyChart, go to [email or web address].    Your next appointment:   6 month(s)  The format for your next appointment:   In Person  Provider:  Richardson Dopp, PA-C [name], MD     Other Instructions  Please call @  [phone] or send mychart message if your leg swelling is no better after 3 days.    Please call or send mychart about your clonidine.

## 2021-09-09 NOTE — Assessment & Plan Note (Signed)
Echocardiogram in May 2022 with normal EF, moderate diastolic dysfunction and moderately elevated PASP.  His weight is up slightly since his last visit.  He has had increased leg edema.  His left leg is always worse than his right.  His lungs are clear, neck veins are flat and has not noted any significant change in his breathing.  I suspect he is mildly volume overloaded.  I have recommended he increase his torsemide to 40 mg in the morning and 20 mg in the evening for 3 days.  He can then resume 20 mg twice daily.  Increase potassium to 60 mEq in the morning and 40 mEq in the evening for 3 days.  He can then resume 40 mEq twice daily.  I have asked him to contact me if his swelling does not improve after 3 days of increased diuretics.  At that point, we could continue higher dose torsemide with close follow-up on renal function and potassium.  Consider the addition of SGLT2 inhibitor and MRA in the future.

## 2021-09-09 NOTE — Assessment & Plan Note (Signed)
LDL optimal.  Continue atorvastatin 80 mg daily.

## 2021-09-11 ENCOUNTER — Ambulatory Visit (INDEPENDENT_AMBULATORY_CARE_PROVIDER_SITE_OTHER): Payer: Medicare PPO | Admitting: Pharmacist

## 2021-09-11 ENCOUNTER — Other Ambulatory Visit: Payer: Self-pay | Admitting: Adult Health

## 2021-09-11 DIAGNOSIS — I10 Essential (primary) hypertension: Secondary | ICD-10-CM

## 2021-09-11 DIAGNOSIS — E1169 Type 2 diabetes mellitus with other specified complication: Secondary | ICD-10-CM

## 2021-09-11 DIAGNOSIS — E119 Type 2 diabetes mellitus without complications: Secondary | ICD-10-CM

## 2021-09-11 NOTE — Addendum Note (Signed)
Addended by: Briant Cedar on: 09/11/2021 10:35 AM   Modules accepted: Orders

## 2021-09-11 NOTE — Patient Instructions (Signed)
Hi Matthew Serrano,  It was great to catch up with you again! Don't forget to weigh yourself daily or at least every other day to make sure you aren't gaining any additional water weight other than around your ankles.  Also, please look into getting your shingles shot at the pharmacy as soon as you can and go ahead and schedule that bone density scan.  Please reach out to me if you have any questions or need anything before our follow up!  Best, Matthew Serrano  Jeni Salles, PharmD, [place] Pharmacist [facility]     Patient Care Plan: [facility] Pharmacy Care Plan     Problem Identified: Problem: Hypertension, Hyperlipidemia, Diabetes, Heart Failure, GERD, Hypothyroidism, and ED and cancer      Long-Range Goal: Patient-Specific Goal   Start Date: 01/29/2021  Expected End Date: 01/29/2022  Recent Progress: On track  Priority: High  Note:   Current Barriers:  Unable to independently monitor therapeutic efficacy Suboptimal therapeutic regimen for blood pressure  Pharmacist Clinical Goal(s):  Patient will achieve adherence to monitoring guidelines and medication adherence to achieve therapeutic efficacy maintain control of cholesterol as evidenced by next lipid panel  through collaboration with PharmD and provider.   Interventions: 1:1 collaboration with [name], NP regarding development and update of comprehensive plan of care as evidenced by provider attestation and co-signature Inter-disciplinary care team collaboration (see longitudinal plan of care) Comprehensive medication review performed; medication list updated in electronic medical record  Hypertension (BP goal <140/90) -Not ideally controlled -Current treatment: Doxazosin 8 mg 1 tablet at bedtime - Query Appropriate, Effective, Query Safe, Accessible Labetalol 200 mg 3 tablets every morning and 2 tablets at bedtime - Appropriate, Effective, Safe, Accessible -Medications previously tried:  Clonidine (no longer needed) -Current home readings: 130-140s for the most part; 144/74, 135/68 (checking every day) -Current dietary habits: doesn't add any salt to his food; doesn't look at package labels; eating some lunch meats; gets canned vegetables (he does rinse them) -Current exercise habits: does not exercise routinely -Denies hypotensive/hypertensive symptoms -Educated on Importance of home blood pressure monitoring; Proper BP monitoring technique; Symptoms of hypotension and importance of maintaining adequate hydration; -Counseled to monitor BP at home weekly, document, and provide log at future appointments -Counseled on diet and exercise extensively Recommended to continue current medication Recommended setting a goal of walking for 15 minutes 3 days a week. Sent message to cardiology about not taking clonidine.  Hyperlipidemia: (LDL goal < 55) -Not ideally controlled -Current treatment: Atorvastatin 80 mg 1 tablet daily - Appropriate, Effective, Safe, Accessible -Medications previously tried: none  -Current dietary patterns: mostly eating baked or boiled foods; cooks with vegetable oil and recommended olive or canola -Current exercise habits: no structured exercise -Educated on Cholesterol goals;  Benefits of statin for ASCVD risk reduction; Importance of limiting foods high in cholesterol; Exercise goal of 150 minutes per week; -Counseled on diet and exercise extensively Recommended to continue current medication  Diabetes (A1c goal <7%) -Controlled -Current medications: Glipizide 10 mg 1 tablet twice daily - Appropriate, Effective, Safe, Accessible Metformin 1000 mg 1 tablet twice daily - Appropriate, Effective, Safe, Accessible -Medications previously tried: none  -Current home glucose readings fasting glucose: 80, 110 (twice a week) post prandial glucose: 154, 150-180 -Reports hypoglycemic/hyperglycemic symptoms -Current meal patterns:  breakfast: grits or  cereal and 1 egg  lunch: n/a  dinner: n/a snacks: ice cream drinks: n/a -Current exercise: no structured exercise -Educated on A1c and blood sugar goals; Exercise goal  of 150 minutes per week; Carbohydrate counting and/or plate method Counseled to check feet daily and get yearly eye exams -Counseled to check feet daily and get yearly eye exams -Counseled on diet and exercise extensively Recommended to continue current medication  CAD (Goal: prevent heart events) -Controlled -Current treatment  Aspirin 81 mg 1 tablet daily - Appropriate, Effective, Safe, Accessible Atorvastatin 80 mg 1 tablet daily - Appropriate, Effective, Safe, Accessible -Medications previously tried: none  -Counseled on limiting NSAID use with aspirin Educated on monitoring for signs of bleeding such as unexplained and excessive bleeding from a cut or injury, easy or excessive bruising, blood in urine or stools, and nosebleeds without a known cause  Heart Failure (Goal: manage symptoms and prevent exacerbations) -Controlled -Last ejection fraction: 55-60% (Date: 12/24/20) -HF type: Diastolic -NYHA Class: II (slight limitation of activity) -AHA HF Stage: C (Heart disease and symptoms present) -Current treatment: Torsemide 20 mg 1 tablet twice daily - Appropriate, Query effective, Safe, Accessible Potassium chloride 20 mEq 2 tablets by mouth twice daily - Appropriate, Effective, Safe, Accessible -Medications previously tried: none  -Current home BP/HR readings: refer to above -Current dietary habits: refer to above -Current exercise habits: limited activity -Educated on Importance of weighing daily; if you gain more than 3 pounds in one day or 5 pounds in one week, call cardiologist Proper diuretic administration and potassium supplementation Importance of blood pressure control -Counseled on diet and exercise extensively Recommended to continue current medication  Anemia (Goal: ferritin  24-380) -Controlled -Current treatment  Ferrous sulfate 325 mg 1 tablet twice daily - Appropriate, Effective, Safe, Accessible -Medications previously tried: none  -Recommended to continue current medication  Hypothyroidism (Goal: 0.35-4.5) -Controlled -Current treatment  Levothyroxine 125 mcg 1 tablet daily - Appropriate, Effective, Safe, Accessible -Medications previously tried: none  -Counseled on importance of taking on an empty stomach and separating from iron tablet by 4 hours or more  Prostate cancer (Goal: remission) -Controlled -Current treatment  Zytiga 250 mg 4 tablets daily - Appropriate, Effective, Safe, Accessible Prednisone 5 mg 1 tablet daily - Appropriate, Effective, Safe, Accessible -Medications previously tried: none  -Recommended to continue current medication Recommended DEXA. Patient acquires through patient assistance program.  Stomach protection (Goal: prevent stomach bleeds) -Controlled -Current treatment  Omeprazole 40 mg 1 capsule every day - Appropriate, Effective, Query Safe, Accessible -Medications previously tried: none  -Educated on use for protection of stomach. Will reassess the need for it at follow up.   Health Maintenance -Vaccine gaps: shingrix, COVID booster -Current therapy:  Vitamin D3 1000 units daily Amoxicillin 500 mg 4 tablets prior to dental visits -Educated on Cost vs benefit of each product must be carefully weighed by individual consumer -Patient is satisfied with current therapy and denies issues -Recommended to continue current medication  Patient Goals/Self-Care Activities Patient will:  - take medications as prescribed check glucose a few times a week, document, and provide at future appointments check blood pressure weekly, document, and provide at future appointments target a minimum of 150 minutes of moderate intensity exercise weekly  Follow Up Plan: Telephone follow up appointment with care management team  member scheduled for: 6 months         Patient verbalizes understanding of instructions and care plan provided today and agrees to view in [place]. Active MyChart status confirmed with patient.   Telephone follow up appointment with pharmacy team member scheduled for:  [name], [facility]

## 2021-09-11 NOTE — Progress Notes (Signed)
Chronic Care Management Pharmacy Note  09/11/2021 Name:  Matthew Serrano. MRN:  [id number] DOB:  September 29, 1945  Summary: A1c is at goal of < 7%  LDL is close to goal < 55  Recommendations/Changes made from today's visit: -Recommended for pt to do daily weights -Consider switching doxazosin for alternative more effective blood pressure medication due to lack of BPH symptoms -Reached out to cardiology about clonidine and pt not being on it -Recommend scheduling DEXA and provided telephone number   Plan: Follow up BP assessment in 3 months   Subjective: Matthew Serrano. is an 76 y.o. year old male who is a primary patient of [name], NP.  The CCM team was consulted for assistance with disease management and care coordination needs.    Engaged with patient by telephone for follow up visit in response to provider referral for pharmacy case management and/or care coordination services.   Consent to Services:  The patient was given information about Chronic Care Management services, agreed to services, and gave verbal consent prior to initiation of services.  Please see initial visit note for detailed documentation.   Patient Care Team: [name], NP as PCP - General (Family Medicine) [name], MD as PCP - Cardiology (Cardiology) [name], MD as Attending Physician (Urology) [name], [facility] as Pharmacist (Pharmacist) Sharmon Revere as Physician Assistant (Cardiology)  Recent office visits: 07/30/21 [name] NP- Patient presented for chronic conditions follow up. No medication changes.  05/12/21 [name] NP- Patient presented for chronic conditions follow up. Decreased levothyroxine to 125 mcg daily.  Recent consult visits: 09/09/21 Richardson Dopp PA-C( Cardiology ) - seen for heart failure and other chronic conditions. Increased torsemide to 2 tablets in AM and 1 in PM x 3 days and increased potassium x 3 days. Recommended reaching  out if leg swelling is not better in 3 days.  08/28/21 [name] MD (oncology) - presented to clinic for follow up on prostate cancer and Eligard infusion. No medication changes. Follow up in 4 months.  04/28/21 [name] MD ( Oncology) - presented to clinic for follow up on prostate cancer and Eligard infusion. No medication changes. Follow up in 4 months.   09/08/20 Darylene Price MD (Cardiothoracic Surgery) - seen for follow up after aortic root replacement. No medication changes and no follow up noted.   08/28/20 Michel Bickers MD (Infectious Disease) - seen for aortic valve endocarditis. No medication changes. Follow up as noted.   08/26/20 [name] MD ( Oncology) - seen for follow up for prostate cancer. No medication changes. Follow up in 4 months.   08/07/20 Michel Bickers MD ( Infectious Disease ) -  Seen for aortic valve endocarditis. No medication changes. Follow up in 3 weeks.   Hospital visits: None in previous 6 months   Objective:  Lab Results  Component Value Date   CREATININE 1.17 08/28/2021   BUN 24 (H) 08/28/2021   GFR 58.63 (L) 07/30/2021   GFRNONAA >60 08/28/2021   GFRAA 99 07/03/2020   NA 145 08/28/2021   K 4.0 08/28/2021   CALCIUM 8.9 08/28/2021   CO2 31 08/28/2021   GLUCOSE 98 08/28/2021    Lab Results  Component Value Date/Time   HGBA1C 6.2 07/30/2021 07:36 AM   HGBA1C 6.3 05/12/2021 07:20 AM   HGBA1C 6.3 02/22/2020 09:29 AM   HGBA1C 6.2 10/23/2019 10:15 AM   GFR 58.63 (L) 07/30/2021 07:36 AM   GFR 83.11 07/26/2019 09:21  AM   MICROALBUR <0.7 04/28/2016 07:56 AM   MICROALBUR 0.8 11/02/2013 08:54 AM    Last diabetic Eye exam:  Lab Results  Component Value Date/Time   HMDIABEYEEXA borderline ocular HTN 12/03/2010 12:00 AM    Last diabetic Foot exam: No results found for: HMDIABFOOTEX   Lab Results  Component Value Date   CHOL 133 07/30/2021   HDL 62.00 07/30/2021   LDLCALC 56 07/30/2021   LDLDIRECT 105.0 04/28/2016   TRIG 75.0 07/30/2021    CHOLHDL 2 07/30/2021    Hepatic Function Latest Ref Rng & Units 08/28/2021 07/30/2021 04/28/2021  Total Protein 6.5 - 8.1 g/dL 6.2(L) 5.6(L) 5.6(L)  Albumin 3.5 - 5.0 g/dL 3.9 3.8 3.7  AST 15 - 41 U/L _0 ALT 0 - 44 U/L _1 Alk Phosphatase 38 - 126 U/L 58 55 65  Total Bilirubin 0.3 - 1.2 mg/dL 0.6 0.5 0.7  Bilirubin, Direct 0.0 - 0.3 mg/dL - - -    Lab Results  Component Value Date/Time   TSH 1.51 07/30/2021 07:36 AM   TSH 1.10 06/15/2021 07:22 AM    CBC Latest Ref Rng & Units 08/28/2021 07/30/2021 04/28/2021  WBC 4.0 - 10.5 K/uL 7.0 6.3 6.8  Hemoglobin 13.0 - 17.0 g/dL 10.5(L) 9.8(L) 10.4(L)  Hematocrit 39.0 - 52.0 % 33.7(L) 30.4(L) 32.7(L)  Platelets 150 - 400 K/uL 190 188.0 179    No results found for: VD25OH  Clinical ASCVD: Yes  The 10-year ASCVD risk score (Arnett DK, et al., 2019) is: 32.5%   Values used to calculate the score:     Age: 75 years     Sex: Male     Is Non-Hispanic African American: Yes     Diabetic: Yes     Tobacco smoker: No     Systolic Blood Pressure: 882 mmHg     Is BP treated: Yes     HDL Cholesterol: 62 mg/dL     Total Cholesterol: 133 mg/dL    Depression screen [facility] 2/9 07/30/2021 11/12/2020 08/07/2020  Decreased Interest 0 0 0  Down, Depressed, Hopeless 1 0 1  PHQ - 2 Score 1 0 1  Some recent data might be hidden       Social History   Tobacco Use  Smoking Status Former  Smokeless Tobacco Never  Tobacco Comments   quit 2005   BP Readings from Last 3 Encounters:  09/09/21 130/82  08/28/21 140/72  07/30/21 (!) 124/54   Pulse Readings from Last 3 Encounters:  09/09/21 60  08/28/21 64  07/30/21 65   Wt Readings from Last 3 Encounters:  09/09/21 203 lb 3.2 oz (92.2 kg)  08/28/21 200 lb 11.2 oz (91 kg)  07/30/21 199 lb 9.6 oz (90.5 kg)   BMI Readings from Last 3 Encounters:  09/09/21 30.90 kg/m  08/28/21 30.52 kg/m  07/30/21 30.35 kg/m    Assessment/Interventions: Review of patient past medical history,  allergies, medications, health status, including review of consultants reports, laboratory and other test data, was performed as part of comprehensive evaluation and provision of chronic care management services.   SDOH:  (Social Determinants of Health) assessments and interventions performed: Yes   SDOH Screenings   Alcohol Screen: Low Risk    Last Alcohol Screening Score (AUDIT): 1  Depression (PHQ2-9): Low Risk    PHQ-2 Score: 1  Financial Resource Strain: Low Risk    Difficulty of Paying Living Expenses: Not hard at all  Food Insecurity: No Food Insecurity  Worried About Charity fundraiser in the Last Year: Never true   [place] in the Last Year: Never true  Housing: Low Risk    Last Housing Risk Score: 0  Physical Activity: Inactive   Days of Exercise per Week: 0 days   Minutes of Exercise per Session: 0 min  Social Connections: Socially Isolated   Frequency of Communication with Friends and Family: More than three times a week   Frequency of Social Gatherings with Friends and Family: More than three times a week   Attends Religious Services: Never   Marine scientist or Organizations: No   Attends Archivist Meetings: Never   Marital Status: Widowed  Stress: No Stress Concern Present   Feeling of Stress : Not at all  Tobacco Use: Medium Risk   Smoking Tobacco Use: Former   Smokeless Tobacco Use: Never   Passive Exposure: Not on file  Transportation Needs: No Transportation Needs   Lack of Transportation (Medical): No   Lack of Transportation (Non-Medical): No    CCM Care Plan  No Known Allergies  Medications Reviewed Today     Reviewed by [name], [facility] (Pharmacist) on 09/11/21 at 45  Med List Status: <None>   Medication Order Taking? Sig Documenting Provider Last Dose Status Informant  abiraterone acetate (ZYTIGA) 250 MG tablet [id number]  TAKE 4 TABLETS (1,000 MG TOTAL) BY MOUTH DAILY. TAKE ON AN EMPTY STOMACH 1 HOUR BEFORE OR 2  HOURS AFTER A MEAL Shadad, Mathis Dad, MD  Active   amoxicillin (AMOXIL) 500 MG tablet [id number]  Take 4 tablets (2,000 mg total) 1 hour prior to all dental visits. [name], MD  Active   aspirin EC 81 MG tablet [id number]  Take 81 mg by mouth daily. Swallow whole. [provider]  Active   atorvastatin (LIPITOR) 80 MG tablet [id number]  Take 1 tablet (80 mg total) by mouth daily. [name], NP  Expired 09/09/21 2359   cholecalciferol (VITAMIN D3) 25 MCG (1000 UT) tablet [id number]  Take 1,000 Units by mouth daily. [provider]  Active Self  doxazosin (CARDURA) 8 MG tablet [id number] Yes Take 1 tablet (8 mg total) by mouth at bedtime. [name], [name], NP Taking Active   ferrous sulfate 325 (65 FE) MG EC tablet [id number]  Take 1 tablet (325 mg total) by mouth 2 (two) times daily before a meal. Willia Craze, NP  Active Self           Med Note Broadus John, REGEENA   Tue May 13, 2020  2:46 PM)    glipiZIDE (GLUCOTROL XL) 10 MG 24 hr tablet [id number]  TAKE 1 TABLET BY MOUTH TWICE A DAY [name], Cory, NP  Active   labetalol (NORMODYNE) 200 MG tablet [id number] Yes TAKE 3 TABLETS EVERY MORNING  AND TAKE 2 TABLETS EVERY EVENING [name], Cory, NP Taking Active   levothyroxine (SYNTHROID) 125 MCG tablet [id number]  Take 1 tablet (125 mcg total) by mouth daily. [name], [name], NP  Active   metFORMIN (GLUCOPHAGE) 1000 MG tablet [id number]  TAKE 1 TABLET (1,000 MG TOTAL) BY MOUTH 2 (TWO) TIMES DAILY WITH A MEAL. [name], [name], NP  Active   omeprazole (PRILOSEC) 40 MG capsule [id number]  TAKE 1 CAPSULE BY MOUTH EVERY DAY Willia Craze, NP  Active   potassium chloride SA (KLOR-CON) 20 MEQ tablet [id number]  Take 2 tablets (40 mEq total) by mouth 2 (two) times daily. Liliane Shi, PA-C  Active  predniSONE (DELTASONE) 5 MG tablet [id number] Yes TAKE 1 TABLET BY MOUTH ONCE A DAY WITH BREAKFAST Shadad, Mathis Dad, MD Taking Active   torsemide (DEMADEX) 20 MG tablet [id number]  Take 1 tablet  (20 mg total) by mouth 2 (two) times daily. Richardson Dopp T, [place]  Expired 09/09/21 2359             Patient Active Problem List   Diagnosis Date Noted   CAD (coronary artery disease) 09/09/2021   Other secondary pulmonary hypertension ([place]) 09/09/2021   Carotid artery disease ([place]) 09/09/2021   Poor dentition 06/26/2020   History of endocarditis 05/21/2020   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   S/P aortic valve allograft 05/15/2020   History of GI bleed 05/14/2020   Hx of fistula between aorta and pulmonary artery, 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 05/14/2020   History of streptococcal septicemia 05/13/2020   (HFpEF) heart failure with preserved ejection fraction ([place]) 05/13/2020   Malignant neoplasm of prostate ([place]) 06/23/2018   Goals of care, counseling/discussion 06/23/2018   Aortic valve disease s/p Bioprosthetic AVR, Ao Root Replacment, Repair of Ao to PA fistula 04/2020    Left inguinal hernia 09/04/2015   Alcoholism ([place]) 06/10/2014   Anemia of chronic disease 05/23/2014   HEMORRHAGE OF RECTUM AND ANUS 12/15/2008   Erectile dysfunction 12/14/2007   SEBORRHEIC KERATOSIS, INFLAMED 08/03/2007   NEOPLASM, SKIN, UNCERTAIN BEHAVIOR 62/83/6629   Hypothyroidism 07/06/2007   Diabetes mellitus ([place]) 07/06/2007   Hyperlipidemia 07/06/2007   Essential hypertension 07/06/2007    Immunization History  Administered Date(s) Administered   Fluad Quad(high Dose 65+) 07/26/2019, 07/29/2020, 07/30/2021   Influenza Split 07/12/2011   Influenza Whole 05/23/2008, 06/13/2009, 05/11/2010   Influenza, High Dose Seasonal PF 05/03/2016, 05/24/2018   Influenza,inj,Quad PF,6+ Mos 05/16/2014   PFIZER(Purple Top)SARS-COV-2 Vaccination 10/21/2019, 11/20/2019, 07/25/2020   Pneumococcal Conjugate-13 11/13/2013   Pneumococcal Polysaccharide-23 06/02/2000, 12/24/2004, 05/03/2016   Td 01/05/1996, 01/12/2007   Patient's swelling has gone down significantly since the cardiology visit.  Patient is not weighing himself every day. Patient is not paying as much attention with sodium foods.   Patient did a little bit of walking on Monday for about half a mile for 15-20 minutes and Wednesday.   Conditions to be addressed/monitored:  Hypertension, Hyperlipidemia, Diabetes, Heart Failure, GERD, Hypothyroidism, and ED and cancer  Conditions addressed this visit: Hypertension, diabetes, heart failure  Care Plan : [place]  Updates made by [name], [place] since 09/11/2021 12:00 AM     Problem: Problem: Hypertension, Hyperlipidemia, Diabetes, Heart Failure, GERD, Hypothyroidism, and ED and cancer      Long-Range Goal: Patient-Specific Goal   Start Date: 01/29/2021  Expected End Date: 01/29/2022  Recent Progress: On track  Priority: High  Note:   Current Barriers:  Unable to independently monitor therapeutic efficacy Suboptimal therapeutic regimen for blood pressure  Pharmacist Clinical Goal(s):  Patient will achieve adherence to monitoring guidelines and medication adherence to achieve therapeutic efficacy maintain control of cholesterol as evidenced by next lipid panel  through collaboration with PharmD and provider.   Interventions: 1:1 collaboration with [name], NP regarding development and update of comprehensive plan of care as evidenced by provider attestation and co-signature Inter-disciplinary care team collaboration (see longitudinal plan of care) Comprehensive medication review performed; medication list updated in electronic medical record  Hypertension (BP goal <140/90) -Not ideally controlled -Current treatment: Doxazosin 8 mg 1 tablet at bedtime - Query Appropriate, Effective, Query Safe, Accessible Labetalol 200 mg 3  tablets every morning and 2 tablets at bedtime - Appropriate, Effective, Safe, Accessible -Medications previously tried: Clonidine (no longer needed) -Current home readings: 130-140s for the most part; 144/74, 135/68  (checking every day) -Current dietary habits: doesn't add any salt to his food; doesn't look at package labels; eating some lunch meats; gets canned vegetables (he does rinse them) -Current exercise habits: does not exercise routinely -Denies hypotensive/hypertensive symptoms -Educated on Importance of home blood pressure monitoring; Proper BP monitoring technique; Symptoms of hypotension and importance of maintaining adequate hydration; -Counseled to monitor BP at home weekly, document, and provide log at future appointments -Counseled on diet and exercise extensively Recommended to continue current medication Recommended setting a goal of walking for 15 minutes 3 days a week. Sent message to cardiology about not taking clonidine.  Hyperlipidemia: (LDL goal < 55) -Not ideally controlled -Current treatment: Atorvastatin 80 mg 1 tablet daily - Appropriate, Effective, Safe, Accessible -Medications previously tried: none  -Current dietary patterns: mostly eating baked or boiled foods; cooks with vegetable oil and recommended olive or canola -Current exercise habits: no structured exercise -Educated on Cholesterol goals;  Benefits of statin for ASCVD risk reduction; Importance of limiting foods high in cholesterol; Exercise goal of 150 minutes per week; -Counseled on diet and exercise extensively Recommended to continue current medication  Diabetes (A1c goal <7%) -Controlled -Current medications: Glipizide 10 mg 1 tablet twice daily - Appropriate, Effective, Safe, Accessible Metformin 1000 mg 1 tablet twice daily - Appropriate, Effective, Safe, Accessible -Medications previously tried: none  -Current home glucose readings fasting glucose: 80, 110 (twice a week) post prandial glucose: 154, 150-180 -Reports hypoglycemic/hyperglycemic symptoms -Current meal patterns:  breakfast: grits or cereal and 1 egg  lunch: n/a  dinner: n/a snacks: ice cream drinks: n/a -Current exercise: no  structured exercise -Educated on A1c and blood sugar goals; Exercise goal of 150 minutes per week; Carbohydrate counting and/or plate method Counseled to check feet daily and get yearly eye exams -Counseled to check feet daily and get yearly eye exams -Counseled on diet and exercise extensively Recommended to continue current medication  CAD (Goal: prevent heart events) -Controlled -Current treatment  Aspirin 81 mg 1 tablet daily - Appropriate, Effective, Safe, Accessible Atorvastatin 80 mg 1 tablet daily - Appropriate, Effective, Safe, Accessible -Medications previously tried: none  -Counseled on limiting NSAID use with aspirin Educated on monitoring for signs of bleeding such as unexplained and excessive bleeding from a cut or injury, easy or excessive bruising, blood in urine or stools, and nosebleeds without a known cause  Heart Failure (Goal: manage symptoms and prevent exacerbations) -Controlled -Last ejection fraction: 55-60% (Date: 12/24/20) -HF type: Diastolic -NYHA Class: II (slight limitation of activity) -AHA HF Stage: C (Heart disease and symptoms present) -Current treatment: Torsemide 20 mg 1 tablet twice daily - Appropriate, Query effective, Safe, Accessible Potassium chloride 20 mEq 2 tablets by mouth twice daily - Appropriate, Effective, Safe, Accessible -Medications previously tried: none  -Current home BP/HR readings: refer to above -Current dietary habits: refer to above -Current exercise habits: limited activity -Educated on Importance of weighing daily; if you gain more than 3 pounds in one day or 5 pounds in one week, call cardiologist Proper diuretic administration and potassium supplementation Importance of blood pressure control -Counseled on diet and exercise extensively Recommended to continue current medication  Anemia (Goal: ferritin 24-380) -Controlled -Current treatment  Ferrous sulfate 325 mg 1 tablet twice daily - Appropriate, Effective, Safe,  Accessible -Medications previously tried: none  -Recommended to  continue current medication  Hypothyroidism (Goal: 0.35-4.5) -Controlled -Current treatment  Levothyroxine 125 mcg 1 tablet daily - Appropriate, Effective, Safe, Accessible -Medications previously tried: none  -Counseled on importance of taking on an empty stomach and separating from iron tablet by 4 hours or more  Prostate cancer (Goal: remission) -Controlled -Current treatment  Zytiga 250 mg 4 tablets daily - Appropriate, Effective, Safe, Accessible Prednisone 5 mg 1 tablet daily - Appropriate, Effective, Safe, Accessible -Medications previously tried: none  -Recommended to continue current medication Recommended DEXA. Patient acquires through patient assistance program.  Stomach protection (Goal: prevent stomach bleeds) -Controlled -Current treatment  Omeprazole 40 mg 1 capsule every day - Appropriate, Effective, Query Safe, Accessible -Medications previously tried: none  -Educated on use for protection of stomach. Will reassess the need for it at follow up.   Health Maintenance -Vaccine gaps: shingrix, COVID booster -Current therapy:  Vitamin D3 1000 units daily Amoxicillin 500 mg 4 tablets prior to dental visits -Educated on Cost vs benefit of each product must be carefully weighed by individual consumer -Patient is satisfied with current therapy and denies issues -Recommended to continue current medication  Patient Goals/Self-Care Activities Patient will:  - take medications as prescribed check glucose a few times a week, document, and provide at future appointments check blood pressure weekly, document, and provide at future appointments target a minimum of 150 minutes of moderate intensity exercise weekly  Follow Up Plan: Telephone follow up appointment with care management team member scheduled for: 6 months      Medication Assistance: None required.  Patient affirms current coverage meets  needs.   Compliance/Adherence/Medication fill history: Care Gaps: Shingrix, eye exam, COVID booster Last BP - 140/72 on 08/28/2021 Last A1C - 6.2 on 07/30/2021  Star-Rating Drugs: Glipizide 18m - last filled on 08/05/2021 [facility] Metformin 10026m- last filled on 08/12/2021 [facility] Atorvastatin 80 mg - last filled 08/18/2021  [facility] ds at [facility]  Patient's preferred [facility] is:  [address] [id number]: [phone]: [id number] [place] [place]: [phone]: [phone]one: [phone]: [phone] pill box? Yes - weekly (one time slot) - early morning he doesn't put in a pillbox (3 days at a time) Pt endorses 99% compliance   We discussed: Current pharmacy is preferred with insurance plan and patient is satisfied with pharmacy services Patient decided to: Continue current medication management strategy  Care Plan and Follow Up Patient Decision:  Patient agrees to Care Plan and Follow-up.  Plan: Telephone follow up appointment with care management team member scheduled for:  6 months  MadJeni SallesharmD, BCAHappys Innarmacist [place] Bra[phone]

## 2021-09-16 ENCOUNTER — Other Ambulatory Visit (HOSPITAL_COMMUNITY): Payer: Self-pay

## 2021-09-16 ENCOUNTER — Other Ambulatory Visit: Payer: Self-pay | Admitting: Oncology

## 2021-09-16 DIAGNOSIS — C61 Malignant neoplasm of prostate: Secondary | ICD-10-CM

## 2021-09-16 MED ORDER — ABIRATERONE ACETATE 250 MG PO TABS
ORAL_TABLET | Freq: Every day | ORAL | 0 refills | Status: DC
  Filled 2021-09-16: qty 120, 30d supply, fill #0

## 2021-09-18 ENCOUNTER — Other Ambulatory Visit: Payer: Self-pay | Admitting: Adult Health

## 2021-09-18 DIAGNOSIS — Z7952 Long term (current) use of systemic steroids: Secondary | ICD-10-CM

## 2021-09-21 ENCOUNTER — Encounter: Payer: Self-pay | Admitting: Oncology

## 2021-09-21 ENCOUNTER — Other Ambulatory Visit (HOSPITAL_COMMUNITY): Payer: Self-pay

## 2021-09-21 ENCOUNTER — Encounter (HOSPITAL_COMMUNITY): Payer: Self-pay | Admitting: Internal Medicine

## 2021-09-22 DIAGNOSIS — Z7984 Long term (current) use of oral hypoglycemic drugs: Secondary | ICD-10-CM

## 2021-09-22 DIAGNOSIS — I2510 Atherosclerotic heart disease of native coronary artery without angina pectoris: Secondary | ICD-10-CM

## 2021-09-22 DIAGNOSIS — I110 Hypertensive heart disease with heart failure: Secondary | ICD-10-CM | POA: Diagnosis not present

## 2021-09-22 DIAGNOSIS — I509 Heart failure, unspecified: Secondary | ICD-10-CM

## 2021-09-22 DIAGNOSIS — E1169 Type 2 diabetes mellitus with other specified complication: Secondary | ICD-10-CM | POA: Diagnosis not present

## 2021-09-22 DIAGNOSIS — E785 Hyperlipidemia, unspecified: Secondary | ICD-10-CM

## 2021-09-23 ENCOUNTER — Other Ambulatory Visit (HOSPITAL_COMMUNITY): Payer: Self-pay

## 2021-09-24 ENCOUNTER — Ambulatory Visit
Admission: RE | Admit: 2021-09-24 | Discharge: 2021-09-24 | Disposition: A | Discharge status: Home / Self Care | Payer: Medicare PPO | Source: Ambulatory Visit | Attending: Adult Health | Admitting: Adult Health

## 2021-09-24 ENCOUNTER — Other Ambulatory Visit: Payer: Self-pay

## 2021-09-24 DIAGNOSIS — Z7952 Long term (current) use of systemic steroids: Secondary | ICD-10-CM

## 2021-09-24 DIAGNOSIS — M810 Age-related osteoporosis without current pathological fracture: Secondary | ICD-10-CM | POA: Diagnosis not present

## 2021-09-24 DIAGNOSIS — M85852 Other specified disorders of bone density and structure, left thigh: Secondary | ICD-10-CM | POA: Diagnosis not present

## 2021-09-30 ENCOUNTER — Other Ambulatory Visit: Payer: Self-pay

## 2021-09-30 DIAGNOSIS — E039 Hypothyroidism, unspecified: Secondary | ICD-10-CM

## 2021-09-30 MED ORDER — LEVOTHYROXINE SODIUM 125 MCG PO TABS: 125.0000 ug | ORAL_TABLET | Freq: Every day | ORAL | 2 refills | Status: DC

## 2021-10-06 ENCOUNTER — Encounter: Payer: Self-pay | Admitting: Oncology

## 2021-10-06 ENCOUNTER — Encounter (HOSPITAL_COMMUNITY): Payer: Self-pay | Admitting: Internal Medicine

## 2021-10-14 ENCOUNTER — Other Ambulatory Visit: Payer: Self-pay | Admitting: Oncology

## 2021-10-14 ENCOUNTER — Other Ambulatory Visit (HOSPITAL_COMMUNITY): Payer: Self-pay

## 2021-10-14 DIAGNOSIS — C61 Malignant neoplasm of prostate: Secondary | ICD-10-CM

## 2021-10-15 ENCOUNTER — Encounter (HOSPITAL_COMMUNITY): Payer: Self-pay | Admitting: Internal Medicine

## 2021-10-15 ENCOUNTER — Other Ambulatory Visit (HOSPITAL_COMMUNITY): Payer: Self-pay

## 2021-10-15 ENCOUNTER — Encounter: Payer: Self-pay | Admitting: Oncology

## 2021-10-15 MED ORDER — ABIRATERONE ACETATE 250 MG PO TABS
ORAL_TABLET | Freq: Every day | ORAL | 0 refills | Status: DC
  Filled 2021-10-15: qty 120, 30d supply, fill #0

## 2021-10-22 ENCOUNTER — Telehealth: Payer: Self-pay | Admitting: Adult Health

## 2021-10-22 NOTE — Telephone Encounter (Signed)
Left message asking  patient to call [phone] ? ?Please r/s 11/13/21 AWV appt   NHA will be out of office ? ?Can schedule calendar year humana ?

## 2021-11-02 ENCOUNTER — Other Ambulatory Visit: Payer: Self-pay | Admitting: Adult Health

## 2021-11-02 ENCOUNTER — Ambulatory Visit (INDEPENDENT_AMBULATORY_CARE_PROVIDER_SITE_OTHER): Payer: Medicare PPO

## 2021-11-02 VITALS — Ht 68.0 in | Wt 209.0 lb

## 2021-11-02 DIAGNOSIS — I10 Essential (primary) hypertension: Secondary | ICD-10-CM

## 2021-11-02 DIAGNOSIS — Z0000 Encounter for general adult medical examination without abnormal findings: Secondary | ICD-10-CM | POA: Diagnosis not present

## 2021-11-02 DIAGNOSIS — E119 Type 2 diabetes mellitus without complications: Secondary | ICD-10-CM

## 2021-11-02 NOTE — Progress Notes (Signed)
I connected with Matthew Serrano today by telephone and verified that I am speaking with the correct person using two identifiers. Location patient: home Location provider: work Persons participating in the virtual visit: [place], Mcfetridge LPN.   I discussed the limitations, risks, security and privacy concerns of performing an evaluation and management service by telephone and the availability of in person appointments. I also discussed with the patient that there may be a patient responsible charge related to this service. The patient expressed understanding and verbally consented to this telephonic visit.    Interactive audio and video telecommunications were attempted between this provider and patient, however failed, due to patient having technical difficulties OR patient did not have access to video capability.  We continued and completed visit with audio only.     Vital signs may be patient reported or missing.  Subjective:   Matthew Serrano. is a 76 y.o. male who presents for Medicare Annual/Subsequent preventive examination.  Review of Systems     Cardiac Risk Factors include: advanced age (>33mn, >>39women);diabetes mellitus;dyslipidemia;hypertension;male gender;obesity (BMI >30kg/m2)     Objective:    Today's Vitals   11/02/21 1426  Weight: 209 lb (94.8 kg)  Height: '5\' 8"'$  (1.727 m)   Body mass index is 31.78 kg/m.  Advanced Directives 11/02/2021 11/12/2020 05/13/2020 05/13/2020 05/10/2020 05/02/2020 02/20/2020  Does Patient Have a Medical Advance Directive? Yes Yes No No No No No  Type of Advance Directive Out of facility DNR (pink MOST or yellow form) [place] - - - - -  Does patient want to make changes to medical advance directive? - No - Patient declined - - - - -  [place] of [place]? - No - copy requested - - - - -  Would patient like information on creating a medical advance directive? - - No -  Patient declined - - No - Patient declined -    Current Medications (verified) Outpatient Encounter Medications as of 11/02/2021  Medication Sig   abiraterone acetate (ZYTIGA) 250 MG tablet TAKE 4 TABLETS (1,000 MG TOTAL) BY MOUTH DAILY. TAKE ON AN EMPTY STOMACH 1 HOUR BEFORE OR 2 HOURS AFTER A MEAL   amoxicillin (AMOXIL) 500 MG tablet Take 4 tablets (2,000 mg total) 1 hour prior to all dental visits.   aspirin EC 81 MG tablet Take 81 mg by mouth daily. Swallow whole.   Calcium Carbonate (CALCIUM 600 PO) Take 2 tablets by mouth daily.   cholecalciferol (VITAMIN D3) 25 MCG (1000 UT) tablet Take 1,000 Units by mouth daily.   doxazosin (CARDURA) 8 MG tablet Take 1 tablet (8 mg total) by mouth at bedtime.   ferrous sulfate 325 (65 FE) MG EC tablet Take 1 tablet (325 mg total) by mouth 2 (two) times daily before a meal.   glipiZIDE (GLUCOTROL XL) 10 MG 24 hr tablet TAKE 1 TABLET BY MOUTH TWICE A DAY   labetalol (NORMODYNE) 200 MG tablet TAKE 3 TABLETS EVERY MORNING  AND TAKE 2 TABLETS EVERY EVENING   levothyroxine (SYNTHROID) 125 MCG tablet Take 1 tablet (125 mcg total) by mouth daily.   metFORMIN (GLUCOPHAGE) 1000 MG tablet TAKE 1 TABLET (1,000 MG TOTAL) BY MOUTH 2 (TWO) TIMES DAILY WITH A MEAL.   omeprazole (PRILOSEC) 40 MG capsule TAKE 1 CAPSULE BY MOUTH EVERY DAY   potassium chloride SA (KLOR-CON) 20 MEQ tablet Take 2 tablets (40 mEq total) by mouth 2 (two) times daily.  predniSONE (DELTASONE) 5 MG tablet TAKE 1 TABLET BY MOUTH ONCE A DAY WITH BREAKFAST   atorvastatin (LIPITOR) 80 MG tablet Take 1 tablet (80 mg total) by mouth daily.   torsemide (DEMADEX) 20 MG tablet Take 1 tablet (20 mg total) by mouth 2 (two) times daily.   No facility-administered encounter medications on file as of 11/02/2021.    Allergies (verified) Patient has no known allergies.   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   23 mm human aortic root graft with 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 and reimplantation of left main and right coronary arteries   Severe aortic insufficiency 05/13/2020   acute onset in setting of bacterial endocarditis   Severe aortic stenosis    Past Surgical History:  Procedure Laterality Date   AORTIC VALVE REPLACEMENT N/A 05/15/2020   Procedure: AORTIC VALVE REPLACEMENT (AVR) SIZE 23 MM,  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;  Surgeon: Rexene Alberts, MD;  Location: [place];  Service: Open Heart Surgery;  Laterality: N/A;   ASCENDING AORTIC ROOT REPLACEMENT N/A 05/15/2020   Procedure: HUMAN ALLOGRAFT AORTIC ROOT REPLACEMENT;  Surgeon: Rexene Alberts, MD;  Location: [place];  Service: Open Heart Surgery;  Laterality: N/A;   COLONOSCOPY     COLONOSCOPY N/A 05/03/2020   Procedure: COLONOSCOPY;  Surgeon: Juanita Craver, MD;  Location: [facility];  Service: Endoscopy;  Laterality: N/A;   COLONOSCOPY W/ BIOPSIES AND POLYPECTOMY     ENTEROSCOPY N/A 05/02/2020   Procedure: ENTEROSCOPY;  Surgeon: Rush Landmark Telford Nab., MD;  Location: [facility];  Service: Gastroenterology;  Laterality: N/A;   HEMOSTASIS CLIP PLACEMENT  05/03/2020   Procedure: HEMOSTASIS CLIP PLACEMENT;  Surgeon: Juanita Craver, MD;  Location: [facility];  Service: Endoscopy;;   HOT HEMOSTASIS N/A 05/03/2020   Procedure: HOT HEMOSTASIS (ARGON PLASMA COAGULATION/BICAP);  Surgeon: Juanita Craver, MD;   Location: [facility];  Service: Endoscopy;  Laterality: N/A;   IR IMAGING GUIDED PORT INSERTION  07/05/2018   POLYPECTOMY     PORT-A-CATH REMOVAL Right 05/20/2020   Procedure: REMOVAL PORT-A-CATH;  Surgeon: Rexene Alberts, MD;  Location[facility];  Service: Thoracic;  Laterality: Right;   RIGHT/LEFT HEART CATH AND CORONARY ANGIOGRAPHY N/A 02/13/2020   Procedure: RIGHT/LEFT HEART CATH AND CORONARY ANGIOGRAPHY;  Surgeon: Belva Crome, MD;  Location: [place] [facility];  Service: Cardiovascular;  Laterality: N/A;   SUBMUCOSAL INJECTION  05/02/2020   Procedure: SUBMUCOSAL INJECTION;  Surgeon: Rush Landmark Telford Nab., MD;  Location: [facility];  Service: Gastroenterology;;   TEE WITHOUT CARDIOVERSION N/A 05/14/2020   Procedure: TRANSESOPHAGEAL ECHOCARDIOGRAM (TEE);  Surgeon: Sanda Klein, MD;  Location: [place];  Service: Cardiovascular;  Laterality: N/A;   TEE WITHOUT CARDIOVERSION N/A 05/15/2020   Procedure: TRANSESOPHAGEAL ECHOCARDIOGRAM (TEE);  Surgeon: Rexene Alberts, MD;  Location: [place];  Service:  Open Heart Surgery;  Laterality: N/A;   Family History  Problem Relation Age of Onset   Hypertension Other    Breast cancer Sister    Breast cancer Other        family history   Breast cancer Brother        prostate ca   Aneurysm Mother        Brain aneurysm    Prostate cancer Father    Colon cancer Neg Hx    Esophageal cancer Neg Hx    Stomach cancer Neg Hx    Rectal cancer Neg Hx    Social History   Socioeconomic History   Marital status: Widowed    Spouse name: Not on file   Number of children: Not on file   Years of education: Not on file   Highest education level: Not on file  Occupational History   Not on file  Tobacco Use   Smoking status: Former   Smokeless tobacco: Never   Tobacco comments:    quit 2005  Vaping Use   Vaping Use: Never used  Substance and Sexual Activity   Alcohol use: Not Currently    Alcohol/week: 14.0 standard drinks    Types: 14 Cans  of beer per week   Drug use: No   Sexual activity: Not on file  Other Topics Concern   Not on file  Social History Narrative   He works as a Freight forwarder    Not married    No kids          Social Determinants of Radio broadcast assistant Strain: Low Risk    Difficulty of Paying Living Expenses: Not hard at all  Food Insecurity: No Food Insecurity   Worried About Charity fundraiser in the Last Year: Never true   Arboriculturist in the Last Year: Never true  Transportation Needs: No Transportation Needs   Lack of Transportation (Medical): No   Lack of Transportation (Non-Medical): No  Physical Activity: Sufficiently Active   Days of Exercise per Week: 5 days   Minutes of Exercise per Session: 30 min  Stress: No Stress Concern Present   Feeling of Stress : Not at all  Social Connections: Socially Isolated   Frequency of Communication with Friends and Family: More than three times a week   Frequency of Social Gatherings with Friends and Family: More than three times a week   Attends Religious Services: Never   Marine scientist or Organizations: No   Attends Archivist Meetings: Never   Marital Status: Widowed    Tobacco Counseling Counseling given: Not Answered Tobacco comments: quit 2005   Clinical Intake:  Pre-visit preparation completed: Yes  Pain : No/denies pain     Nutritional Status: BMI > 30  Obese Nutritional Risks: None Diabetes: Yes  How often do you need to have someone help you when you read instructions, pamphlets, or other written materials from your doctor or pharmacy?: 1 - Never What is the last grade level you completed in school?: 11th grade GED  Diabetic? Yes Nutrition Risk Assessment:  Has the patient had any N/V/D within the last 2 months?  No  Does the patient have any non-healing wounds?  No  Has the patient had any unintentional weight loss or weight gain?  No   Diabetes:  Is the patient diabetic?  Yes  If  diabetic, was a CBG obtained today?  No  Did the patient bring in  their glucometer from home?  No  How often do you monitor your CBG's? Twice weekly.   Financial Strains and Diabetes Management:  Are you having any financial strains with the device, your supplies or your medication? No .  Does the patient want to be seen by Chronic Care Management for management of their diabetes?  No  Would the patient like to be referred to a Nutritionist or for Diabetic Management?  No   Diabetic Exams:  Diabetic Eye Exam: Overdue for diabetic eye exam. Pt has been advised about the importance in completing this exam. Patient advised to call and schedule an eye exam. Diabetic Foot Exam: Completed 01/22/2021   Interpreter Needed?: No  Information entered by :: NAllen LPN   Activities of Daily Living In your present state of health, do you have any difficulty performing the following activities: 11/02/2021 11/12/2020  Hearing? N N  Vision? N N  Difficulty concentrating or making decisions? N N  Walking or climbing stairs? N N  Dressing or bathing? N N  Doing errands, shopping? N N  Preparing Food and eating ? N N  Using the Toilet? N N  In the past six months, have you accidently leaked urine? N N  Do you have problems with loss of bowel control? N N  Managing your Medications? N N  Managing your Finances? N N  Housekeeping or managing your Housekeeping? N N  Some recent data might be hidden    Patient Care Team: [name], NP as PCP - General (Family Medicine) [name], MD as PCP - Cardiology (Cardiology) [name], MD as Attending Physician (Urology) [name], [facility] as Pharmacist (Pharmacist) Sharmon Revere as Physician Assistant (Cardiology)  Indicate any recent Medical Services you may have received from other than [facility] providers in the past year (date may be approximate).     Assessment:   This is a routine wellness examination for  Warren.  Hearing/Vision screen Vision Screening - Comments:: No regular eye exams  Dietary issues and exercise activities discussed: Current Exercise Habits: Home exercise routine, Type of exercise: walking;Other - see comments (stationary bike), Time (Minutes): 30, Frequency (Times/Week): 5, Weekly Exercise (Minutes/Week): 150   Goals Addressed             This Visit's Progress    Patient Stated       11/02/2021, continue to walk daily and lose 10 pounds       Depression Screen PHQ 2/9 Scores 11/02/2021 07/30/2021 11/12/2020 08/07/2020 07/29/2020 07/24/2020 07/02/2020  PHQ - 2 Score 0 1 0 1 0 0 0    Fall Risk Fall Risk  11/02/2021 07/30/2021 11/12/2020 08/07/2020 07/29/2020  Falls in the past year? 0 0 1 0 0  Number falls in past yr: - - 0 - 0  Injury with Fall? - - 0 - -  Risk for fall due to : Medication side effect - No Fall Risks - -  Follow up Falls evaluation completed;Education provided;Falls prevention discussed - Falls evaluation completed;Falls prevention discussed Falls evaluation completed -    FALL RISK PREVENTION PERTAINING TO THE HOME:  Any stairs in or around the home? Yes  If so, are there any without handrails? No  Home free of loose throw rugs in walkways, pet beds, electrical cords, etc? Yes  Adequate lighting in your home to reduce risk of falls? Yes   ASSISTIVE DEVICES UTILIZED TO PREVENT FALLS:  Life alert? No  Use of a cane, walker or  w/c? No  Grab bars in the bathroom? Yes  Shower chair or bench in shower? No  Elevated toilet seat or a handicapped toilet? Yes   TIMED UP AND GO:  Was the test performed? No .       Cognitive Function:     6CIT Screen 11/02/2021  What Year? 0 points  What month? 0 points  What time? 0 points  Count back from 20 0 points  Months in reverse 0 points  Repeat phrase 2 points  Total Score 2    Immunizations Immunization History  Administered Date(s) Administered   Fluad Quad(high Dose 65+) 07/26/2019,  07/29/2020, 07/30/2021   Influenza Split 07/12/2011   Influenza Whole 05/23/2008, 06/13/2009, 05/11/2010   Influenza, High Dose Seasonal PF 05/03/2016, 05/24/2018   Influenza,inj,Quad PF,6+ Mos 05/16/2014   PFIZER(Purple Top)SARS-COV-2 Vaccination 10/21/2019, 11/20/2019, 07/25/2020   Pneumococcal Conjugate-13 11/13/2013   Pneumococcal Polysaccharide-23 06/02/2000, 12/24/2004, 05/03/2016   Td 01/05/1996, 01/12/2007    TDAP status: Due, Education has been provided regarding the importance of this vaccine. Advised may receive this vaccine at local pharmacy or Health Dept. Aware to provide a copy of the vaccination record if obtained from local pharmacy or Health Dept. Verbalized acceptance and understanding.  Flu Vaccine status: Up to date  Pneumococcal vaccine status: Up to date  Covid-19 vaccine status: Completed vaccines  Qualifies for Shingles Vaccine? Yes   Zostavax completed No   Shingrix Completed?: No.    Education has been provided regarding the importance of this vaccine. Patient has been advised to call insurance company to determine out of pocket expense if they have not yet received this vaccine. Advised may also receive vaccine at local pharmacy or Health Dept. Verbalized acceptance and understanding.  Screening Tests Health Maintenance  Topic Date Due   Zoster Vaccines- Shingrix (1 of 2) Never done   OPHTHALMOLOGY EXAM  09/25/2014   COVID-19 Vaccine (4 - Booster for Pfizer series) 09/19/2020   FOOT EXAM  01/22/2022   HEMOGLOBIN A1C  01/28/2022   COLONOSCOPY (Pts 45-8yr Insurance coverage will need to be confirmed)  05/04/2023   Pneumonia Vaccine 75 Years old  Completed   INFLUENZA VACCINE  Completed   Hepatitis C Screening  Completed   HPV VACCINES  Aged Out    Health Maintenance  Health Maintenance Due  Topic Date Due   Zoster Vaccines- Shingrix (1 of 2) Never done   OPHTHALMOLOGY EXAM  09/25/2014   COVID-19 Vaccine (4 - Booster for PNormanseries)  09/19/2020    Colorectal cancer screening: Type of screening: Colonoscopy. Completed 05/03/2020. Repeat every 3 years  Lung Cancer Screening: (Low Dose CT Chest recommended if Age 75-80years, 30 pack-year currently smoking OR have quit w/in 15years.) does not qualify.   Lung Cancer Screening Referral: no  Additional Screening:  Hepatitis C Screening: does qualify; Completed 07/25/2021  Vision Screening: Recommended annual ophthalmology exams for early detection of glaucoma and other disorders of the eye. Is the patient up to date with their annual eye exam?  No  Who is the provider or what is the name of the office in which the patient attends annual eye exams? none If pt is not established with a provider, would they like to be referred to a provider to establish care? No .   Dental Screening: Recommended annual dental exams for proper oral hygiene  Community Resource Referral / Chronic Care Management: CRR required this visit?  No   CCM required this visit?  No  Plan:     I have personally reviewed and noted the following in the patients chart:   Medical and social history Use of alcohol, tobacco or illicit drugs  Current medications and supplements including opioid prescriptions. Patient is not currently taking opioid prescriptions. Functional ability and status Nutritional status Physical activity Advanced directives List of other physicians Hospitalizations, surgeries, and ER visits in previous 12 months Vitals Screenings to include cognitive, depression, and falls Referrals and appointments  In addition, I have reviewed and discussed with patient certain preventive protocols, quality metrics, and best practice recommendations. A written personalized care plan for preventive services as well as general preventive health recommendations were provided to patient.     Kellie Simmering, LPN   7/68/1157   Nurse Notes: none  Due to this being a virtual visit,  the after visit summary with patients personalized plan was offered to patient via mail or my-chart.  patient was mailed a copy of AVS.

## 2021-11-02 NOTE — Patient Instructions (Signed)
Matthew Serrano , Thank you for taking time to come for your Medicare Wellness Visit. I appreciate your ongoing commitment to your health goals. Please review the following plan we discussed and let me know if I can assist you in the future.   Screening recommendations/referrals: Colonoscopy: completed 05/03/2020, due 05/04/2023 Recommended yearly ophthalmology/optometry visit for glaucoma screening and checkup Recommended yearly dental visit for hygiene and checkup  Vaccinations: Influenza vaccine: completed 07/30/2021, due next flu season Pneumococcal vaccine: completed 05/03/2016 Tdap vaccine: due Shingles vaccine: discussed   Covid-19:  07/25/2020, 11/20/2019, 10/21/2019  Advanced directives: copy in chart  Conditions/risks identified: none  Next appointment: Follow up in one year for your annual wellness visit.   Preventive Care 33 Years and Older, Male Preventive care refers to lifestyle choices and visits with your health care provider that can promote health and wellness. What does preventive care include? A yearly physical exam. This is also called an annual well check. Dental exams once or twice a year. Routine eye exams. Ask your health care provider how often you should have your eyes checked. Personal lifestyle choices, including: Daily care of your teeth and gums. Regular physical activity. Eating a healthy diet. Avoiding tobacco and drug use. Limiting alcohol use. Practicing safe sex. Taking low doses of aspirin every day. Taking vitamin and mineral supplements as recommended by your health care provider. What happens during an annual well check? The services and screenings done by your health care provider during your annual well check will depend on your age, overall health, lifestyle risk factors, and family history of disease. Counseling  Your health care provider may ask you questions about your: Alcohol use. Tobacco use. Drug use. Emotional well-being. Home and  relationship well-being. Sexual activity. Eating habits. History of falls. Memory and ability to understand (cognition). Work and work Statistician. Screening  You may have the following tests or measurements: Height, weight, and BMI. Blood pressure. Lipid and cholesterol levels. These may be checked every 5 years, or more frequently if you are over 40 years old. Skin check. Lung cancer screening. You may have this screening every year starting at age 59 if you have a 30-pack-year history of smoking and currently smoke or have quit within the past 15 years. Fecal occult blood test (FOBT) of the stool. You may have this test every year starting at age 70. Flexible sigmoidoscopy or colonoscopy. You may have a sigmoidoscopy every 5 years or a colonoscopy every 10 years starting at age 58. Prostate cancer screening. Recommendations will vary depending on your family history and other risks. Hepatitis C blood test. Hepatitis B blood test. Sexually transmitted disease (STD) testing. Diabetes screening. This is done by checking your blood sugar (glucose) after you have not eaten for a while (fasting). You may have this done every 1-3 years. Abdominal aortic aneurysm (AAA) screening. You may need this if you are a current or former smoker. Osteoporosis. You may be screened starting at age 81 if you are at high risk. Talk with your health care provider about your test results, treatment options, and if necessary, the need for more tests. Vaccines  Your health care provider may recommend certain vaccines, such as: Influenza vaccine. This is recommended every year. Tetanus, diphtheria, and acellular pertussis (Tdap, Td) vaccine. You may need a Td booster every 10 years. Zoster vaccine. You may need this after age 22. Pneumococcal 13-valent conjugate (PCV13) vaccine. One dose is recommended after age 71. Pneumococcal polysaccharide (PPSV23) vaccine. One dose is recommended after  age 52. Talk to your  health care provider about which screenings and vaccines you need and how often you need them. This information is not intended to replace advice given to you by your health care provider. Make sure you discuss any questions you have with your health care provider. Document Released: 09/05/2015 Document Revised: 04/28/2016 Document Reviewed: 06/10/2015 Elsevier Interactive Patient Education  2017 [place] Prevention in the Home Falls can cause injuries. They can happen to people of all ages. There are many things you can do to make your home safe and to help prevent falls. What can I do on the outside of my home? Regularly fix the edges of walkways and driveways and fix any cracks. Remove anything that might make you trip as you walk through a door, such as a raised step or threshold. Trim any bushes or trees on the path to your home. Use bright outdoor lighting. Clear any walking paths of anything that might make someone trip, such as rocks or tools. Regularly check to see if handrails are loose or broken. Make sure that both sides of any steps have handrails. Any raised decks and porches should have guardrails on the edges. Have any leaves, snow, or ice cleared regularly. Use sand or salt on walking paths during winter. Clean up any spills in your garage right away. This includes oil or grease spills. What can I do in the bathroom? Use night lights. Install grab bars by the toilet and in the tub and shower. Do not use towel bars as grab bars. Use non-skid mats or decals in the tub or shower. If you need to sit down in the shower, use a plastic, non-slip stool. Keep the floor dry. Clean up any water that spills on the floor as soon as it happens. Remove soap buildup in the tub or shower regularly. Attach bath mats securely with double-sided non-slip rug tape. Do not have throw rugs and other things on the floor that can make you trip. What can I do in the bedroom? Use night  lights. Make sure that you have a light by your bed that is easy to reach. Do not use any sheets or blankets that are too big for your bed. They should not hang down onto the floor. Have a firm chair that has side arms. You can use this for support while you get dressed. Do not have throw rugs and other things on the floor that can make you trip. What can I do in the kitchen? Clean up any spills right away. Avoid walking on wet floors. Keep items that you use a lot in easy-to-reach places. If you need to reach something above you, use a strong step stool that has a grab bar. Keep electrical cords out of the way. Do not use floor polish or wax that makes floors slippery. If you must use wax, use non-skid floor wax. Do not have throw rugs and other things on the floor that can make you trip. What can I do with my stairs? Do not leave any items on the stairs. Make sure that there are handrails on both sides of the stairs and use them. Fix handrails that are broken or loose. Make sure that handrails are as long as the stairways. Check any carpeting to make sure that it is firmly attached to the stairs. Fix any carpet that is loose or worn. Avoid having throw rugs at the top or bottom of the stairs. If you do  have throw rugs, attach them to the floor with carpet tape. Make sure that you have a light switch at the top of the stairs and the bottom of the stairs. If you do not have them, ask someone to add them for you. What else can I do to help prevent falls? Wear shoes that: Do not have high heels. Have rubber bottoms. Are comfortable and fit you well. Are closed at the toe. Do not wear sandals. If you use a stepladder: Make sure that it is fully opened. Do not climb a closed stepladder. Make sure that both sides of the stepladder are locked into place. Ask someone to hold it for you, if possible. Clearly mark and make sure that you can see: Any grab bars or handrails. First and last  steps. Where the edge of each step is. Use tools that help you move around (mobility aids) if they are needed. These include: Canes. Walkers. Scooters. Crutches. Turn on the lights when you go into a dark area. Replace any light bulbs as soon as they burn out. Set up your furniture so you have a clear path. Avoid moving your furniture around. If any of your floors are uneven, fix them. If there are any pets around you, be aware of where they are. Review your medicines with your doctor. Some medicines can make you feel dizzy. This can increase your chance of falling. Ask your doctor what other things that you can do to help prevent falls. This information is not intended to replace advice given to you by your health care provider. Make sure you discuss any questions you have with your health care provider. Document Released: 06/05/2009 Document Revised: 01/15/2016 Document Reviewed: 09/13/2014 Elsevier Interactive Patient Education  2017 [employer].

## 2021-11-09 ENCOUNTER — Other Ambulatory Visit (HOSPITAL_COMMUNITY): Payer: Self-pay

## 2021-11-09 ENCOUNTER — Other Ambulatory Visit: Payer: Self-pay | Admitting: Oncology

## 2021-11-09 DIAGNOSIS — C61 Malignant neoplasm of prostate: Secondary | ICD-10-CM

## 2021-11-10 ENCOUNTER — Encounter (HOSPITAL_COMMUNITY): Payer: Self-pay | Admitting: Internal Medicine

## 2021-11-10 ENCOUNTER — Other Ambulatory Visit (HOSPITAL_COMMUNITY): Payer: Self-pay

## 2021-11-10 ENCOUNTER — Encounter: Payer: Self-pay | Admitting: Oncology

## 2021-11-10 MED ORDER — ABIRATERONE ACETATE 250 MG PO TABS
ORAL_TABLET | Freq: Every day | ORAL | 0 refills | Status: DC
  Filled 2021-11-10: qty 120, 30d supply, fill #0

## 2021-11-12 ENCOUNTER — Other Ambulatory Visit (HOSPITAL_COMMUNITY): Payer: Self-pay

## 2021-11-13 ENCOUNTER — Ambulatory Visit: Payer: Medicare Other

## 2021-12-07 ENCOUNTER — Other Ambulatory Visit: Payer: Self-pay | Admitting: Oncology

## 2021-12-07 ENCOUNTER — Other Ambulatory Visit (HOSPITAL_COMMUNITY): Payer: Self-pay

## 2021-12-07 DIAGNOSIS — C61 Malignant neoplasm of prostate: Secondary | ICD-10-CM

## 2021-12-08 ENCOUNTER — Encounter (HOSPITAL_COMMUNITY): Payer: Self-pay | Admitting: Internal Medicine

## 2021-12-08 ENCOUNTER — Encounter: Payer: Self-pay | Admitting: Oncology

## 2021-12-08 MED ORDER — ABIRATERONE ACETATE 250 MG PO TABS
ORAL_TABLET | Freq: Every day | ORAL | 0 refills | Status: DC
  Filled 2021-12-09: qty 120, 30d supply, fill #0

## 2021-12-09 ENCOUNTER — Other Ambulatory Visit (HOSPITAL_COMMUNITY): Payer: Self-pay

## 2021-12-15 ENCOUNTER — Telehealth: Payer: Self-pay | Admitting: Pharmacist

## 2021-12-15 NOTE — Chronic Care Management (AMB) (Signed)
? ? ?Chronic Care Management ?Pharmacy Assistant  ? ?Name: Matthew Serrano.  MRN: [id number] DOB: 1946-07-28 ? ?Reason for Encounter: Disease State / Hypertension Assessment Call ?  ?Conditions to be addressed/monitored: ?HTN ? ?Recent office visits:  ?11/02/2021 - Glenna Durand LPN - Medicare annual wellness exam ? ?Recent consult visits:  ?None ? ?Hospital visits:  ?None ? ?Medications: ?Outpatient Encounter Medications as of 12/15/2021  ?Medication Sig  ? abiraterone acetate (ZYTIGA) 250 MG tablet TAKE 4 TABLETS (1,000 MG TOTAL) BY MOUTH DAILY. TAKE ON AN EMPTY STOMACH 1 HOUR BEFORE OR 2 HOURS AFTER A MEAL  ? amoxicillin (AMOXIL) 500 MG tablet Take 4 tablets (2,000 mg total) 1 hour prior to all dental visits.  ? aspirin EC 81 MG tablet Take 81 mg by mouth daily. Swallow whole.  ? atorvastatin (LIPITOR) 80 MG tablet Take 1 tablet (80 mg total) by mouth daily.  ? Calcium Carbonate (CALCIUM 600 PO) Take 2 tablets by mouth daily.  ? cholecalciferol (VITAMIN D3) 25 MCG (1000 UT) tablet Take 1,000 Units by mouth daily.  ? doxazosin (CARDURA) 8 MG tablet TAKE 1 TABLET AT BEDTIME  ? ferrous sulfate 325 (65 FE) MG EC tablet Take 1 tablet (325 mg total) by mouth 2 (two) times daily before a meal.  ? glipiZIDE (GLUCOTROL XL) 10 MG 24 hr tablet TAKE 1 TABLET BY MOUTH TWICE A DAY  ? labetalol (NORMODYNE) 200 MG tablet TAKE 3 TABLETS EVERY MORNING AND TAKE 2 TABLETS EVERY EVENING  ? levothyroxine (SYNTHROID) 125 MCG tablet Take 1 tablet (125 mcg total) by mouth daily.  ? metFORMIN (GLUCOPHAGE) 1000 MG tablet TAKE 1 TABLET TWICE DAILY WITH MEALS (NEED MD APPOINTMENT FOR REFILLS)  ? omeprazole (PRILOSEC) 40 MG capsule TAKE 1 CAPSULE BY MOUTH EVERY DAY  ? potassium chloride SA (KLOR-CON) 20 MEQ tablet Take 2 tablets (40 mEq total) by mouth 2 (two) times daily.  ? predniSONE (DELTASONE) 5 MG tablet TAKE 1 TABLET BY MOUTH ONCE A DAY WITH BREAKFAST  ? torsemide (DEMADEX) 20 MG tablet Take 1 tablet (20 mg total) by mouth 2 (two)  times daily.  ? ?No facility-administered encounter medications on file as of 12/15/2021.  ?Fill History: ?glipizide ER 10 mg tablet, extended release 24 hr 11/02/2021 90  ? ?metformin 1,000 mg tablet 11/04/2021 90  ? ?OMEPRAZOLE DR 40 MG CAPSULE 07/20/2021 90  ? ?levothyroxine 125 mcg tablet 12/15/2021 90  ? ?labetalol 200 mg tablet 11/04/2021 90  ? ?labetalol 200 mg tablet 11/04/2021 90  ? ?atorvastatin 80 mg tablet 11/02/2021 90  ? ?abiraterone acetate (ZYTIGA) 250 MG tablet 11/12/2021 30  ? ?Reviewed chart prior to disease state call. Spoke with patient regarding BP ? ?Recent Office Vitals: ?BP Readings from Last 3 Encounters:  ?09/09/21 130/82  ?08/28/21 140/72  ?07/30/21 (!) 124/54  ? ?Pulse Readings from Last 3 Encounters:  ?09/09/21 60  ?08/28/21 64  ?07/30/21 65  ?  ?Wt Readings from Last 3 Encounters:  ?11/02/21 209 lb (94.8 kg)  ?09/09/21 203 lb 3.2 oz (92.2 kg)  ?08/28/21 200 lb 11.2 oz (91 kg)  ?  ? ?Kidney Function ?Lab Results  ?Component Value Date/Time  ? CREATININE 1.17 08/28/2021 02:34 PM  ? CREATININE 1.22 07/30/2021 07:36 AM  ? CREATININE 1.15 04/28/2021 09:19 AM  ? CREATININE 1.13 07/29/2020 08:31 AM  ? GFR 58.63 (L) 07/30/2021 07:36 AM  ? GFRNONAA >60 08/28/2021 02:34 PM  ? GFRAA 99 07/03/2020 10:16 AM  ? GFRAA >60 04/29/2020 09:48 AM  ? ? ? ?  Latest Ref Rng & Units 08/28/2021  ?  2:34 PM 07/30/2021  ?  7:36 AM 04/28/2021  ?  9:19 AM  ?BMP  ?Glucose 70 - 99 mg/dL 98   113   153    ?BUN 8 - 23 mg/dL '24   24   21    '$ ?Creatinine 0.61 - 1.24 mg/dL 1.17   1.22   1.15    ?Sodium 135 - 145 mmol/L 145   144   145    ?Potassium 3.5 - 5.1 mmol/L 4.0   4.5   4.1    ?Chloride 98 - 111 mmol/L 104   110   108    ?CO2 22 - 32 mmol/L '31   28   26    '$ ?Calcium 8.9 - 10.3 mg/dL 8.9   8.9   9.3    ? ? ?Current antihypertensive regimen:  ?Doxazosin 8 mg - 1 tablet at bedtime ?Lebetalol 200 mg - 3 tablets in the AM and 2 tablets in the PM ?Torsemide 20 mg - 1 tablet twice daily ? ?How often are you checking your Blood  Pressure? Patient states he is checking his blood pressure daily.  ? ?Current home BP readings: Patients recent readings are 12/15/21 135/70, 12/14/21 155/80, 12/13/21 126/66.  ? ?What recent interventions/DTPs have been made by any provider to improve Blood Pressure control since last CPP Visit: No recent interventions. ? ?Any recent hospitalizations or ED visits since last visit with CPP? No recent hospital visits. ? ?What diet changes have been made to improve Blood Pressure Control?  ?Patient follows low sugar ?Breakfast - patient will have cereal  ?Lunch - patient will have a [place] sandwich and fries ?Dinner - patient will have a meat, vegetable and starch ? ?What exercise is being done to improve your Blood Pressure Control?  ?Patient is walking daily and has started to go to the community center a few times weekly.  ? ?Adherence Review: ?Is the patient currently on ACE/ARB medication? Yes ?Does the patient have >5 day gap between last estimated fill dates? No ? ?Care Gaps: ?AWV - scheduled for 11/08/2022 ?Last BP - 130/82 on 09/09/2021 ?Last A1C - 6.2 on 07/30/2021 ?Shingrix - never done ?Eye exam - overdue ?Covid booster - overdue ?  ?Star Rating Drug: ?Glipizide '10mg'$  - last filled on 11/02/2021 90 DS at [facility] ?Metformin '1000mg'$  - last filled on 11/04/2021 90DS at [facility] ?Atorvastatin 80 mg - last filled 11/02/2021  90 DS at [facility] ? ?Gennie Alma CMA  ?Clinical Pharmacist Assistant ?[phone] ? ?

## 2021-12-18 ENCOUNTER — Other Ambulatory Visit (HOSPITAL_COMMUNITY): Payer: Self-pay

## 2021-12-24 ENCOUNTER — Telehealth: Payer: Self-pay | Admitting: Oncology

## 2021-12-24 NOTE — Telephone Encounter (Signed)
Called patient regarding upcoming appointments, patient is notified. 

## 2021-12-25 ENCOUNTER — Other Ambulatory Visit: Payer: Self-pay

## 2021-12-25 ENCOUNTER — Inpatient Hospital Stay: Payer: Medicare PPO

## 2021-12-25 ENCOUNTER — Inpatient Hospital Stay: Payer: Medicare PPO | Attending: Oncology

## 2021-12-25 ENCOUNTER — Inpatient Hospital Stay (HOSPITAL_BASED_OUTPATIENT_CLINIC_OR_DEPARTMENT_OTHER): Payer: Medicare PPO | Admitting: Oncology

## 2021-12-25 VITALS — BP 131/68 | HR 84 | Temp 98.1°F | Resp 18 | Ht 68.0 in | Wt 203.4 lb

## 2021-12-25 DIAGNOSIS — Z79899 Other long term (current) drug therapy: Secondary | ICD-10-CM | POA: Insufficient documentation

## 2021-12-25 DIAGNOSIS — C61 Malignant neoplasm of prostate: Secondary | ICD-10-CM

## 2021-12-25 DIAGNOSIS — E876 Hypokalemia: Secondary | ICD-10-CM | POA: Insufficient documentation

## 2021-12-25 LAB — CBC WITH DIFFERENTIAL (CANCER CENTER ONLY)
Abs Immature Granulocytes: 0.02 10*3/uL (ref 0.00–0.07)
Basophils Absolute: 0 10*3/uL (ref 0.0–0.1)
Basophils Relative: 1 %
Eosinophils Absolute: 0.1 10*3/uL (ref 0.0–0.5)
Eosinophils Relative: 2 %
HCT: 34.8 % — ABNORMAL LOW (ref 39.0–52.0)
Hemoglobin: 11.2 g/dL — ABNORMAL LOW (ref 13.0–17.0)
Immature Granulocytes: 0 %
Lymphocytes Relative: 34 %
Lymphs Abs: 2.2 10*3/uL (ref 0.7–4.0)
MCH: 27.9 pg (ref 26.0–34.0)
MCHC: 32.2 g/dL (ref 30.0–36.0)
MCV: 86.6 fL (ref 80.0–100.0)
Monocytes Absolute: 0.5 10*3/uL (ref 0.1–1.0)
Monocytes Relative: 8 %
Neutro Abs: 3.6 10*3/uL (ref 1.7–7.7)
Neutrophils Relative %: 55 %
Platelet Count: 191 10*3/uL (ref 150–400)
RBC: 4.02 MIL/uL — ABNORMAL LOW (ref 4.22–5.81)
RDW: 16 % — ABNORMAL HIGH (ref 11.5–15.5)
WBC Count: 6.5 10*3/uL (ref 4.0–10.5)
nRBC: 0 % (ref 0.0–0.2)

## 2021-12-25 LAB — CMP (CANCER CENTER ONLY)
ALT: 16 U/L (ref 0–44)
AST: 19 U/L (ref 15–41)
Albumin: 4 g/dL (ref 3.5–5.0)
Alkaline Phosphatase: 50 U/L (ref 38–126)
Anion gap: 12 (ref 5–15)
BUN: 28 mg/dL — ABNORMAL HIGH (ref 8–23)
CO2: 28 mmol/L (ref 22–32)
Calcium: 9.2 mg/dL (ref 8.9–10.3)
Chloride: 102 mmol/L (ref 98–111)
Creatinine: 1.31 mg/dL — ABNORMAL HIGH (ref 0.61–1.24)
GFR, Estimated: 57 mL/min — ABNORMAL LOW (ref >60)
Glucose, Bld: 163 mg/dL — ABNORMAL HIGH (ref 70–99)
Potassium: 3.1 mmol/L — ABNORMAL LOW (ref 3.5–5.1)
Sodium: 142 mmol/L (ref 135–145)
Total Bilirubin: 0.7 mg/dL (ref 0.3–1.2)
Total Protein: 6.4 g/dL — ABNORMAL LOW (ref 6.5–8.1)

## 2021-12-25 MED ORDER — DENOSUMAB 60 MG/ML ~~LOC~~ SOSY: 60.0000 mg | PREFILLED_SYRINGE | Freq: Once | SUBCUTANEOUS | Status: DC

## 2021-12-25 MED ORDER — SODIUM CHLORIDE 0.9 % IV SOLN: Freq: Once | INTRAVENOUS | Status: DC

## 2021-12-25 MED ORDER — LEUPROLIDE ACETATE (4 MONTH) 30 MG ~~LOC~~ KIT
30.0000 mg | PACK | Freq: Once | SUBCUTANEOUS | Status: AC
  Administered 2021-12-25: 30 mg via SUBCUTANEOUS
  Filled 2021-12-25: qty 30

## 2021-12-25 NOTE — Progress Notes (Signed)
Hematology and Oncology Follow Up Visit ? ?Matthew Serrano ?[id number] ?09-15-1945 76 y.o. ?12/25/2021 8:17 AM ?Deveron Furlong, NP  ? ?Principle Diagnosis: 76 year old man with advanced prostate cancer with lymphadenopathy diagnosed in 2019.  He has castration-resistant after presenting with PSA of 1170 and a Gleason score 4+5 = 9 at the time of diagnosis. ? ? ?Prior Therapy: ? ?Status post prostate biopsy obtained on 06/06/2018 which showed high-volume disease with a Gleason score 4+5 = 9. ? ?Taxotere chemotherapy at 75 mg/m? every 3 weeks started on 07/07/2018.  He he is here for cycle 6 of therapy.  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 ? ?Current therapy:  ? ?Eligard 30 mg every 4 months.  Next injection will be given today. ? ?Zytiga 1000 mg daily started in April 2020. ? ?Interim History: Mr. Matthew Serrano is here for repeat evaluation.  Since last visit, he reports no major changes in his health.  He denies any complication related to [facility].  He denies any nausea, fatigue or lower extremity swelling.  He denies any arthralgias or myalgias.  His performance status quality of life remains unchanged. ? ? ? ? ? ? ? ? ? ? ? ? ? ? ?  ? ? ?Medications: Reviewed without changes ?Current Outpatient Medications  ?Medication Sig Dispense Refill  ? abiraterone acetate (ZYTIGA) 250 MG tablet TAKE 4 TABLETS (1,000 MG TOTAL) BY MOUTH DAILY. TAKE ON AN EMPTY STOMACH 1 HOUR BEFORE OR 2 HOURS AFTER A MEAL 120 tablet 0  ? amoxicillin (AMOXIL) 500 MG tablet Take 4 tablets (2,000 mg total) 1 hour prior to all dental visits. 12 tablet 12  ? aspirin EC 81 MG tablet Take 81 mg by mouth daily. Swallow whole.    ? atorvastatin (LIPITOR) 80 MG tablet Take 1 tablet (80 mg total) by mouth daily. 90 tablet 2  ? Calcium Carbonate (CALCIUM 600 PO) Take 2 tablets by mouth daily.    ? cholecalciferol (VITAMIN D3) 25 MCG (1000 UT) tablet Take 1,000 Units by mouth daily.    ?  doxazosin (CARDURA) 8 MG tablet TAKE 1 TABLET AT BEDTIME 90 tablet 3  ? ferrous sulfate 325 (65 FE) MG EC tablet Take 1 tablet (325 mg total) by mouth 2 (two) times daily before a meal. 60 tablet 2  ? glipiZIDE (GLUCOTROL XL) 10 MG 24 hr tablet TAKE 1 TABLET BY MOUTH TWICE A DAY 90 tablet 0  ? labetalol (NORMODYNE) 200 MG tablet TAKE 3 TABLETS EVERY MORNING AND TAKE 2 TABLETS EVERY EVENING 450 tablet 3  ? levothyroxine (SYNTHROID) 125 MCG tablet Take 1 tablet (125 mcg total) by mouth daily. 90 tablet 2  ? metFORMIN (GLUCOPHAGE) 1000 MG tablet TAKE 1 TABLET TWICE DAILY WITH MEALS (NEED MD APPOINTMENT FOR REFILLS) 180 tablet 0  ? omeprazole (PRILOSEC) 40 MG capsule TAKE 1 CAPSULE BY MOUTH EVERY DAY 90 capsule 1  ? potassium chloride SA (KLOR-CON) 20 MEQ tablet Take 2 tablets (40 mEq total) by mouth 2 (two) times daily. 360 tablet 3  ? predniSONE (DELTASONE) 5 MG tablet TAKE 1 TABLET BY MOUTH ONCE A DAY WITH BREAKFAST 90 tablet 3  ? torsemide (DEMADEX) 20 MG tablet Take 1 tablet (20 mg total) by mouth 2 (two) times daily. 180 tablet 3  ? ?No current facility-administered medications for this visit.  ? ? ? ?Allergies: No Known Allergies ? ? ? ? ? ?Physical Exam: ? ? ? ? ?Blood pressure  131/68, pulse 84, temperature 98.1 ?F (36.7 ?C), temperature source Temporal, resp. rate 18, height '5\' 8"'$  (1.727 m), weight 203 lb 6.4 oz (92.3 kg), SpO2 98 %. ? ? ? ? ? ? ? ?ECOG: 0 ? ? ?General appearance: Alert, awake without any distress. ?Head: Atraumatic without abnormalities ?Oropharynx: Without any thrush or ulcers. ?Eyes: No scleral icterus. ?Lymph nodes: No lymphadenopathy noted in the cervical, supraclavicular, or axillary nodes ?Heart:regular rate and rhythm, without any murmurs or gallops.   ?Lung: Clear to auscultation without any rhonchi, wheezes or dullness to percussion. ?Abdomin: Soft, nontender without any shifting dullness or ascites. ?Musculoskeletal: No clubbing or cyanosis. ?Neurological: No motor or sensory  deficits. ?Skin: No rashes or lesions. ? ? ? ? ? ? ? ? ? ? ? ? ? ? ?Lab Results: ?Lab Results  ?Component Value Date  ? WBC 7.0 08/28/2021  ? HGB 10.5 (L) 08/28/2021  ? HCT 33.7 (L) 08/28/2021  ? MCV 85.8 08/28/2021  ? PLT 190 08/28/2021  ? ?  Chemistry   ?   ?Component Value Date/Time  ? NA 145 08/28/2021 1434  ? NA 141 01/20/2021 0801  ? K 4.0 08/28/2021 1434  ? CL 104 08/28/2021 1434  ? CO2 31 08/28/2021 1434  ? BUN 24 (H) 08/28/2021 1434  ? BUN 30 (H) 01/20/2021 0801  ? CREATININE 1.17 08/28/2021 1434  ? CREATININE 1.13 07/29/2020 0831  ?    ?Component Value Date/Time  ? CALCIUM 8.9 08/28/2021 1434  ? ALKPHOS 58 08/28/2021 1434  ? AST 21 08/28/2021 1434  ? ALT 22 08/28/2021 1434  ? BILITOT 0.6 08/28/2021 1434  ?  ? ? ? ? ? ? Latest Reference Range & Units 04/28/21 09:19 08/28/21 14:34  ?Prostate Specific Ag, Serum 0.0 - 4.0 ng/mL <0.1 <0.1  ? ? ? ? ? ? ? ? ? ? ? ? ?Impression and Plan: ? ?76 year old man with: ?  ?1.  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   ? ?His disease status was updated at this time and treatment choices were reviewed.  His PSA continues to be undetectable on Zytiga.  Risks and benefits of continuing this treatment were discussed at this time.  Complications include hypertension, weight gain among others were reiterated.  Different salvage therapy options were also reviewed and will be deferred to a later date at this time.  He is agreeable to continue at this time. ? ?2.  Androgen deprivation: Risks and benefits of continuing Eligard were reviewed at this time.  Weight gain, hot flashes and sexual dysfunction were reviewed.  He is agreeable to continue at this time. ? ? ?3.  Goals of care and prognosis: His disease is incurable although aggressive measures are warranted. ? ?4.  Hypokalemia: No issues reported with his potassium.  This will be monitored periodically. ? ?5.  Bone health.  Bone density obtained on September 24, 2021 showed  osteoporosis.  I recommended calcium and vitamin D supplements and consideration for Prolia.  Risks and benefits of this treatment were discussed today.  Complication bloating osteonecrosis of the jaw and hypocalcemia were reviewed.  He edentulous not planning any further dental interventions.  He is agreeable to proceed with Prolia which we will start with the next visit. ? ?  ?6.  Follow-up: He will return in 4 months for repeat follow-up. ?  ?30  minutes were spent on this encounter.  The time was dedicated to reviewing imaging studies, laboratory data, treatment choices and addressing complication  related to cancer and cancer therapy. ? ?  ?  ? ? ?[name], MD ?5/5/20238:17 AM  ? ? ? ? ?And ?

## 2021-12-25 NOTE — Patient Instructions (Signed)
Leuprolide depot injection ?What is this medication? ?LEUPROLIDE (loo PROE lide) is a man-made protein that acts like a natural hormone in the body. It decreases testosterone in men and decreases estrogen in women. In men, this medicine is used to treat advanced prostate cancer. In women, some forms of this medicine may be used to treat endometriosis, uterine fibroids, or other male hormone-related problems. ?This medicine may be used for other purposes; ask your health care provider or pharmacist if you have questions. ?COMMON BRAND NAME(S): Eligard, Fensolv, Lupron Depot, Lupron Depot-Ped, Viadur ?What should I tell my care team before I take this medication? ?They need to know if you have any of these conditions: ?diabetes ?heart disease or previous heart attack ?high blood pressure ?high cholesterol ?mental illness ?osteoporosis ?pain or difficulty passing urine ?seizures ?spinal cord metastasis ?stroke ?suicidal thoughts, plans, or attempt; a previous suicide attempt by you or a family member ?tobacco smoker ?unusual vaginal bleeding (women) ?an unusual or allergic reaction to leuprolide, benzyl alcohol, other medicines, foods, dyes, or preservatives ?pregnant or trying to get pregnant ?breast-feeding ?How should I use this medication? ?This medicine is for injection into a muscle or for injection under the skin. It is given by a health care professional in a hospital or clinic setting. The specific product will determine how it will be given to you. Make sure you understand which product you receive and how often you will receive it. ?Talk to your pediatrician regarding the use of this medicine in children. Special care may be needed. ?Overdosage: If you think you have taken too much of this medicine contact a poison control center or emergency room at once. ?NOTE: This medicine is only for you. Do not share this medicine with others. ?What if I miss a dose? ?It is important not to miss a dose. Call your  doctor or health care professional if you are unable to keep an appointment. ?Depot injections: Depot injections are given either once-monthly, every 12 weeks, every 16 weeks, or every 24 weeks depending on the product you are prescribed. The product you are prescribed will be based on if you are male or male, and your condition. Make sure you understand your product and dosing. ?What may interact with this medication? ?Do not take this medicine with any of the following medications: ?chasteberry ?cisapride ?dronedarone ?pimozide ?thioridazine ?This medicine may also interact with the following medications: ?herbal or dietary supplements, like black cohosh or DHEA ?male hormones, like estrogens or progestins and birth control pills, patches, rings, or injections ?male hormones, like testosterone ?other medicines that prolong the QT interval (abnormal heart rhythm) ?This list may not describe all possible interactions. Give your health care provider a list of all the medicines, herbs, non-prescription drugs, or dietary supplements you use. Also tell them if you smoke, drink alcohol, or use illegal drugs. Some items may interact with your medicine. ?What should I watch for while using this medication? ?Visit your doctor or health care professional for regular checks on your progress. During the first weeks of treatment, your symptoms may get worse, but then will improve as you continue your treatment. You may get hot flashes, increased bone pain, increased difficulty passing urine, or an aggravation of nerve symptoms. Discuss these effects with your doctor or health care professional, some of them may improve with continued use of this medicine. ?Male patients may experience a menstrual cycle or spotting during the first months of therapy with this medicine. If this continues, contact your doctor or   health care professional. This medicine may increase blood sugar. Ask your healthcare provider if changes in diet  or medicines are needed if you have diabetes. What side effects may I notice from receiving this medication? Side effects that you should report to your doctor or health care professional as soon as possible: allergic reactions like skin rash, itching or hives, swelling of the face, lips, or tongue breathing problems chest pain depression or memory disorders pain in your legs or groin pain at site where injected or implanted seizures severe headache signs and symptoms of high blood sugar such as being more thirsty or hungry or having to urinate more than normal. You may also feel very tired or have blurry vision swelling of the feet and legs suicidal thoughts or other mood changes visual changes vomiting Side effects that usually do not require medical attention (report to your doctor or health care professional if they continue or are bothersome): breast swelling or tenderness decrease in sex drive or performance diarrhea hot flashes loss of appetite muscle, joint, or bone pains nausea redness or irritation at site where injected or implanted skin problems or acne This list may not describe all possible side effects. Call your doctor for medical advice about side effects. You may report side effects to FDA at 1-800-FDA-1088. Where should I keep my medication? This drug is given in a hospital or clinic and will not be stored at home. NOTE: This sheet is a summary. It may not cover all possible information. If you have questions about this medicine, talk to your doctor, pharmacist, or health care provider.  2023 Elsevier/Gold Standard (2021-07-10 00:00:00)  

## 2021-12-26 LAB — PROSTATE-SPECIFIC AG, SERUM (LABCORP): Prostate Specific Ag, Serum: <0.1 ng/mL (ref 0.0–4.0)

## 2021-12-28 ENCOUNTER — Telehealth: Payer: Self-pay | Admitting: *Deleted

## 2021-12-28 NOTE — Telephone Encounter (Signed)
-----   Message from Wyatt Portela, MD sent at 12/28/2021  9:25 AM EDT ----- ?Please let him know his PSA is down ?

## 2021-12-28 NOTE — Telephone Encounter (Signed)
LM with note below 

## 2022-01-08 ENCOUNTER — Other Ambulatory Visit: Payer: Self-pay | Admitting: Oncology

## 2022-01-08 ENCOUNTER — Other Ambulatory Visit (HOSPITAL_COMMUNITY): Payer: Self-pay

## 2022-01-08 DIAGNOSIS — C61 Malignant neoplasm of prostate: Secondary | ICD-10-CM

## 2022-01-08 MED ORDER — ABIRATERONE ACETATE 250 MG PO TABS
ORAL_TABLET | Freq: Every day | ORAL | 0 refills | Status: DC
  Filled 2022-01-08: qty 120, 30d supply, fill #0

## 2022-01-08 MED ORDER — PREDNISONE 5 MG PO TABS
ORAL_TABLET | Freq: Every day | ORAL | 3 refills | Status: DC
  Filled 2022-01-08: qty 90, 90d supply, fill #0
  Filled 2022-04-06: qty 90, 90d supply, fill #1

## 2022-01-14 ENCOUNTER — Other Ambulatory Visit (HOSPITAL_COMMUNITY): Payer: Self-pay

## 2022-01-18 ENCOUNTER — Other Ambulatory Visit: Payer: Self-pay | Admitting: Adult Health

## 2022-01-18 DIAGNOSIS — E119 Type 2 diabetes mellitus without complications: Secondary | ICD-10-CM

## 2022-01-25 NOTE — Progress Notes (Unsigned)
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 Echo 5/22: EF 55-60, Gr 2 DD, RVSP 46, severe LAE, mild MR, AVR ok (trivial AI) Heart failure with preserved ejection fraction  Coronary artery disease  Cath 6/21: mod non-obs CAD  Pulmonary hypertension Diabetes mellitus  Hypertension  Hyperlipidemia Hypothyroidism  Erectile dysfunction Metastatic prostate CA on suppression chemotherapy  Hx of LGI bleed 01/2020 Colo w cecal telangiectasias >> treated w APC S/p PRBC transfusions   Prior CV Studies: Echocardiogram 12/24/2020 EF 55-60, no RWMA, mild LVH, GRII DD, normal RVSF, moderately elevated PASP, severe LAE, mild RAE, mild MR AVR with trivial AI and no aortic stenosis, RVSP 46   Echocardiogram 06/24/2020 EF 55-60, mild conc LVH, Gr 2 DD, normal RVSF, mild LAE, trivial MR, AVR functioning normally (mean gradient 5 mmHg)   Cardiac catheterization 02/13/20 LAD mid 50 LCx prox 50 RCA mid 45   Carotid US 02/20/20 Bilat ICA 1-39   ABIs 8/18:   Normal    Nuclear stress test 03/25/17 Probably normal, low risk stress nuclear study with inferobasal thinning but no significant ischemia; EF 64 with normal wall motion.   Abdominal MRA 8/13 1.  Negative for renal artery stenosis or other lesion to suggest renovascular component of hypertension.   Renal Art Duplex 6/13 No RAS; normal caliber abd aorta  History of Present Illness:   Matthew Milligan. is a 76 y.o. male with the above problem list.   He was last seen in January 2023.  He was mildly volume overloaded and I adjusted his torsemide for 3 days.  He returns for follow-up.   He is here alone.  He has noted some substernal chest pain after eating for the past month.  He has had some chest soreness since his surgery.  This pain is different.  He has been walking for about 30 min every day.  He has not been short of breath.  He is not having exertional chest pain.  He has not had syncope, orthopnea, leg edema.  He misses his K+ tablets often b/c they are so large.          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   History of nuclear stress test    Myoview 8/18: EF 64, inferobasal thinning, no ischemia, low risk   HYPERLIPIDEMIA 07/06/2007   HYPERTENSION 07/06/2007   HYPOTHYROIDISM 07/06/2007   Leg pain    ABIs 8/18:  Normal    Obesity    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 TAKE ON AN EMPTY STOMACH 1 HOUR BEFORE OR 2 HOURS AFTER A MEAL   amoxicillin (AMOXIL) 500 MG tablet Take 4 tablets (2,000 mg total) 1 hour prior to all dental visits.   aspirin EC 81 MG tablet Take 81 mg by mouth daily. Swallow whole.   atorvastatin (LIPITOR) 80 MG tablet Take 1 tablet (80 mg total) by mouth daily.   Calcium Carbonate  (CALCIUM 600 PO) Take 2 tablets by mouth daily.   cholecalciferol (VITAMIN D3) 25 MCG (1000 UT) tablet Take 1,000 Units by mouth daily.   doxazosin (CARDURA) 8 MG tablet TAKE 1 TABLET AT BEDTIME   ferrous sulfate 325 (65 FE) MG EC tablet Take 1 tablet (325 mg total) by mouth 2 (two) times daily before a meal.   glipiZIDE (GLUCOTROL XL) 10 MG 24 hr tablet TAKE 1 TABLET TWICE DAILY   labetalol (NORMODYNE) 200 MG tablet TAKE 3 TABLETS EVERY MORNING AND TAKE 2 TABLETS EVERY EVENING   levothyroxine (SYNTHROID) 125 MCG tablet Take 1 tablet (125 mcg total) by mouth daily.   metFORMIN (GLUCOPHAGE) 1000 MG tablet TAKE 1 TABLET TWICE DAILY WITH MEALS (NEED MD APPOINTMENT FOR REFILLS)   omeprazole (PRILOSEC) 40 MG capsule Take 1 tablet by mouth twice a day X's 2 weeks then reduce back to 1 tablet by mouth daily   potassium chloride SA (KLOR-CON) 20 MEQ tablet Take 2 tablets (40 mEq total) by mouth 2 (two) times daily.   predniSONE (DELTASONE) 5 MG tablet TAKE 1 TABLET BY MOUTH ONCE A DAY WITH BREAKFAST   torsemide (DEMADEX) 20 MG tablet Take 1 tablet (20 mg total) by mouth 2 (two) times daily.   [DISCONTINUED] omeprazole (PRILOSEC) 40 MG capsule TAKE 1 CAPSULE BY MOUTH EVERY DAY    Allergies:   Patient has no known allergies.   Social History   Tobacco Use   Smoking status: Former   Smokeless tobacco: Never   Tobacco comments:    quit 2005  Vaping Use   Vaping Use: Never used  Substance Use Topics   Alcohol use: Not Currently    Alcohol/week: 14.0 standard drinks    Types: 14 Cans of beer per week   Drug use: No    Family Hx: The patient's family history includes Aneurysm in his mother; Breast cancer in his brother, sister, and another family member; Hypertension in an other family member; Prostate cancer in his father. There is no history of Colon cancer, Esophageal cancer, Stomach cancer, or Rectal cancer.  Review of Systems  Gastrointestinal:  Negative for dysphagia, flatus, hematochezia  and melena.  Genitourinary:  Negative for hematuria.    EKGs/Labs/Other Test Reviewed:    EKG:  EKG is   ordered today.  The ekg ordered today demonstrates normal sinus rhythm, HR 62, inf Qs, ant-lat TW inversions, QTc 434 ms   Recent Labs: 07/30/2021: TSH 1.51 12/25/2021: ALT 16; BUN 28; Creatinine 1.31; Hemoglobin 11.2; Platelet Count 191; Potassium 3.1; Sodium 142   Recent Lipid Panel Recent Labs    07/30/21 0736  CHOL 133  TRIG 75.0  HDL 62.00  VLDL 15.0  LDLCALC 56     Risk Assessment/Calculations:  Physical Exam:    VS:  BP 138/68   Pulse 64   Ht '5\' 8"'$  (1.727 m)   Wt 201 lb 12.8 oz (91.5 kg)   SpO2 96%   BMI 30.68 kg/m     Wt Readings from Last 3 Encounters:  01/26/22 201 lb 12.8 oz (91.5 kg)  12/25/21 203 lb 6.4 oz (92.3 kg)  11/02/21 209 lb (94.8 kg)    Constitutional:      Appearance: Healthy appearance. Not in distress.  Neck:     Vascular: No JVR. JVD normal.  Pulmonary:     Effort: Pulmonary effort is normal.     Breath sounds: No wheezing. No rales.  Cardiovascular:     Normal rate. Regular rhythm. Normal S1. Normal S2.      Murmurs: There is a grade 3/6 holosystolic murmur at the URSB and ULSB.  Edema:    Peripheral edema absent.  Abdominal:     Palpations: Abdomen is soft.  Skin:    General: Skin is warm and dry.  Neurological:     General: No focal deficit present.     Mental Status: Alert and oriented to person, place and time.     Cranial Nerves: Cranial nerves are intact.        ASSESSMENT & PLAN:   Precordial pain He notes substernal chest discomfort over the past month with meals.  He has not had exertional chest pain or exertional shortness of breath.  He had nonobstructive disease on his cardiac catheterization prior to surgery.  However, he does have T wave inversions in V2 through V6 as well as 1 and aVL on his electrocardiogram today.  This appears to be new.  I have recommended proceeding with a Lexiscan Myoview to  rule out ischemia.  I did review this with Dr. Burt Knack, his cardiologist, who agreed.  As his symptoms do sound more gastrointestinal in nature, I have recommended increasing his Prilosec to 40 mg twice daily for 2 weeks, then resume 40 mg daily.  I have asked him to contact me early next week.  If he has not noticed significant improvement, I will start him on isosorbide mononitrate 30 mg daily.  He does not take PDE-5 inhibitors.  Follow-up 3-4 weeks.  Aortic valve disease s/p Bioprosthetic AVR, Ao Root Replacment, Repair of Ao to PA fistula 04/2020 Normally functioning AVR on echocardiogram in May 2022.  He had mild mitral regurgitation at that time.  He has had a loud systolic murmur noted prior to his echocardiogram in May 2022.  His murmur is still prominent on exam.  I have recommended proceeding with follow-up echocardiogram to reassess his mitral gravitation as well as his aortic valve prosthesis.  Continue SBE prophylaxis.  (HFpEF) heart failure with preserved ejection fraction (HCC) Volume status appears stable.  He is NYHA II.  He started walking on a daily basis and feels better.  He has had difficulty taking potassium tablets.  He had a recent potassium that was low.  He has not had a potassium pill for the past 2 days.  Obtain BMET today.  If his potassium continues to run low, I will place him on spironolactone.  Continue torsemide 20 mg twice daily.  At some point, we can consider adding SGLT2 inhibitor.  CAD (coronary artery disease) Coronary disease by cardiac catheterization in 2021.  As noted, he has had some chest pain that sounds noncardiac.  However, he has anterolateral T wave inversions on electrocardiogram.  Proceed with [place]  Myoview as noted.  Continue aspirin 81 mg daily, atorvastatin 80 mg daily, labetalol 600 mg in the morning, 400 mg in the evening.  Essential hypertension Blood pressure is controlled.  Continue doxazosin 8 mg daily, labetalol 600 mg in the morning and  400 mg in the evening.  Hyperlipidemia LDL goal <70 LDL optimal in December 2022.  Continue atorvastatin 80 mg daily.  Other secondary pulmonary hypertension ([place]) Likely related to heart failure.  Obtain follow-up echocardiogram as noted.        Shared Decision Making/Informed Consent The risks [chest pain, shortness of breath, cardiac arrhythmias, dizziness, blood pressure fluctuations, myocardial infarction, stroke/transient ischemic attack, nausea, vomiting, allergic reaction, radiation exposure, metallic taste sensation and life-threatening complications (estimated to be 1 in 10,000)], benefits (risk stratification, diagnosing coronary artery disease, treatment guidance) and alternatives of a nuclear stress test were discussed in detail with Matthew Serrano and he agrees to proceed.   Dispo:  Return in about 4 weeks (around 02/23/2022) for Follow up after testing, w/ Richardson Dopp, PA-C.   Medication Adjustments/Labs and Tests Ordered: Current medicines are reviewed at length with the patient today.  Concerns regarding medicines are outlined above.  Tests Ordered: Orders Placed This Encounter  Procedures   Basic metabolic panel   MYOCARDIAL PERFUSION IMAGING   EKG 12-Lead   ECHOCARDIOGRAM COMPLETE   Medication Changes: Meds ordered this encounter  Medications   omeprazole (PRILOSEC) 40 MG capsule    Sig: Take 1 tablet by mouth twice a day X's 2 weeks then reduce back to 1 tablet by mouth daily    Dispense:  104 capsule    Refill:  1   Signed, Richardson Dopp, PA-C  01/26/2022 8:50 AM    [address], [place], [place]  [address] Phone: [phone]; Fax: [phone]

## 2022-01-26 ENCOUNTER — Encounter (HOSPITAL_COMMUNITY): Payer: Self-pay | Admitting: Internal Medicine

## 2022-01-26 ENCOUNTER — Ambulatory Visit (INDEPENDENT_AMBULATORY_CARE_PROVIDER_SITE_OTHER): Payer: Medicare PPO | Admitting: Physician Assistant

## 2022-01-26 ENCOUNTER — Telehealth: Payer: Self-pay | Admitting: Physician Assistant

## 2022-01-26 ENCOUNTER — Encounter: Payer: Self-pay | Admitting: Physician Assistant

## 2022-01-26 ENCOUNTER — Encounter: Payer: Self-pay | Admitting: Oncology

## 2022-01-26 VITALS — BP 138/68 | HR 64 | Ht 68.0 in | Wt 201.8 lb

## 2022-01-26 DIAGNOSIS — I5032 Chronic diastolic (congestive) heart failure: Secondary | ICD-10-CM | POA: Diagnosis not present

## 2022-01-26 DIAGNOSIS — R072 Precordial pain: Secondary | ICD-10-CM

## 2022-01-26 DIAGNOSIS — I2729 Other secondary pulmonary hypertension: Secondary | ICD-10-CM

## 2022-01-26 DIAGNOSIS — I350 Nonrheumatic aortic (valve) stenosis: Secondary | ICD-10-CM | POA: Diagnosis not present

## 2022-01-26 DIAGNOSIS — I2510 Atherosclerotic heart disease of native coronary artery without angina pectoris: Secondary | ICD-10-CM

## 2022-01-26 DIAGNOSIS — I10 Essential (primary) hypertension: Secondary | ICD-10-CM

## 2022-01-26 DIAGNOSIS — E785 Hyperlipidemia, unspecified: Secondary | ICD-10-CM | POA: Diagnosis not present

## 2022-01-26 LAB — BASIC METABOLIC PANEL
BUN/Creatinine Ratio: 21 (ref 10–24)
BUN: 25 mg/dL (ref 8–27)
CO2: 28 mmol/L (ref 20–29)
Calcium: 9.2 mg/dL (ref 8.6–10.2)
Chloride: 98 mmol/L (ref 96–106)
Creatinine, Ser: 1.18 mg/dL (ref 0.76–1.27)
Glucose: 113 mg/dL — ABNORMAL HIGH (ref 70–99)
Potassium: 2.8 mmol/L — CL (ref 3.5–5.2)
Sodium: 146 mmol/L — ABNORMAL HIGH (ref 134–144)
eGFR: 65 mL/min/{1.73_m2} (ref >59)

## 2022-01-26 MED ORDER — OMEPRAZOLE 40 MG PO CPDR: DELAYED_RELEASE_CAPSULE | ORAL | 1 refills | Status: DC

## 2022-01-26 NOTE — Assessment & Plan Note (Signed)
Normally functioning AVR on echocardiogram in May 2022.  He had mild mitral regurgitation at that time.  He has had a loud systolic murmur noted prior to his echocardiogram in May 2022.  His murmur is still prominent on exam.  I have recommended proceeding with follow-up echocardiogram to reassess his mitral gravitation as well as his aortic valve prosthesis.  Continue SBE prophylaxis.

## 2022-01-26 NOTE — Assessment & Plan Note (Signed)
Coronary disease by cardiac catheterization in 2021.  As noted, he has had some chest pain that sounds noncardiac.  However, he has anterolateral T wave inversions on electrocardiogram.  Proceed with Lexiscan Myoview as noted.  Continue aspirin 81 mg daily, atorvastatin 80 mg daily, labetalol 600 mg in the morning, 400 mg in the evening.

## 2022-01-26 NOTE — Patient Instructions (Addendum)
Medication Instructions:  Your physician has recommended you make the following change in your medication:   INCREASE the Prilosec to twice a day for 2 weeks then reduce back to 1 daily  *If you need a refill on your cardiac medications before your next appointment, please call your pharmacy*   Lab Work: TODAY:  BMET  If you have labs (blood work) drawn today and your tests are completely normal, you will receive your results only by: [place] (if you have MyChart) OR A paper copy in the mail If you have any lab test that is abnormal or we need to change your treatment, we will call you to review the results.   Testing/Procedures: Your physician has requested that you have an echocardiogram. Echocardiography is a painless test that uses sound waves to create images of your heart. It provides your doctor with information about the size and shape of your heart and how well your heart's chambers and valves are working. This procedure takes approximately one hour. There are no restrictions for this procedure.   Your physician has requested that you have a lexiscan myoview. For further information please visit [email or web address]. Please follow instruction sheet,BELOW:    You are scheduled for a Myocardial Perfusion Imaging Study Please arrive 15 minutes prior to your appointment time for registration and insurance purposes.  The test will take approximately 3 to 4 hours to complete; you may bring reading material.  If someone comes with you to your appointment, they will need to remain in the main lobby due to limited space in the testing area. **If you are pregnant or breastfeeding, please notify the nuclear lab prior to your appointment**  How to prepare for your Myocardial Perfusion Test: Do not eat or drink 3 hours prior to your test, except you may have water. Do not consume products containing caffeine (regular or decaffeinated) 12 hours prior to your test. (ex: coffee,  chocolate, sodas, tea). Do bring a list of your current medications with you.  If not listed below, you may take your medications as normal. Do wear comfortable clothes (no dresses or overalls) and walking shoes, tennis shoes preferred (No heels or open toe shoes are allowed). Do NOT wear cologne, perfume, aftershave, or lotions (deodorant is allowed). If these instructions are not followed, your test will have to be rescheduled.      Follow-Up: At [facility], you and your health needs are our priority.  As part of our continuing mission to provide you with exceptional heart care, we have created designated Provider Care Teams.  These Care Teams include your primary Cardiologist (physician) and Advanced Practice Providers (APPs -  Physician Assistants and Nurse Practitioners) who all work together to provide you with the care you need, when you need it.  We recommend signing up for the patient portal called "MyChart".  Sign up information is provided on this After Visit Summary.  MyChart is used to connect with patients for Virtual Visits (Telemedicine).  Patients are able to view lab/test results, encounter notes, upcoming appointments, etc.  Non-urgent messages can be sent to your provider as well.   To learn more about what you can do with MyChart, go to [email or web address].    Your next appointment:   3-4 week(s)  The format for your next appointment:   In Person  Provider:   [name], MD  or Richardson Dopp, PA-C         Other Instructions   Important Information About Sugar

## 2022-01-26 NOTE — Telephone Encounter (Signed)
   Rec'd a call from [employer] re: his lab work.  K+ 2.8.  Spoke w/ pt on the phone, he frequently misses doses of his K+, takes it 2-3 x week.   Requested he take a total of 4 pills tonight, 3 pills in am, then go back on 2 pills bid.   Pt asymptomatic, agreeable to this plan.   Lab Results  Component Value Date   NA WILL FOLLOW 01/26/2022   K 2.8 (LL) 01/26/2022   CO2 28 01/26/2022   GLUCOSE 113 (H) 01/26/2022   BUN 25 01/26/2022   CREATININE 1.18 01/26/2022   CALCIUM 9.2 01/26/2022   EGFR 65 01/26/2022   GFRNONAA 57 (L) 12/25/2021   Emelly Wurtz, PA-C 01/26/2022 7:56 PM

## 2022-01-26 NOTE — Assessment & Plan Note (Signed)
He notes substernal chest discomfort over the past month with meals.  He has not had exertional chest pain or exertional shortness of breath.  He had nonobstructive disease on his cardiac catheterization prior to surgery.  However, he does have T wave inversions in V2 through V6 as well as 1 and aVL on his electrocardiogram today.  This appears to be new.  I have recommended proceeding with a Lexiscan Myoview to rule out ischemia.  I did review this with Dr. Burt Knack, his cardiologist, who agreed.  As his symptoms do sound more gastrointestinal in nature, I have recommended increasing his Prilosec to 40 mg twice daily for 2 weeks, then resume 40 mg daily.  I have asked him to contact me early next week.  If he has not noticed significant improvement, I will start him on isosorbide mononitrate 30 mg daily.  He does not take PDE-5 inhibitors.  Follow-up 3-4 weeks.

## 2022-01-26 NOTE — Assessment & Plan Note (Signed)
LDL optimal in December 2022.  Continue atorvastatin 80 mg daily.

## 2022-01-26 NOTE — Addendum Note (Signed)
Addended byKathlen Mody, Nicki Reaper T on: 01/26/2022 10:20 AM   Modules accepted: Orders

## 2022-01-26 NOTE — Assessment & Plan Note (Signed)
Likely related to heart failure.  Obtain follow-up echocardiogram as noted.

## 2022-01-26 NOTE — Assessment & Plan Note (Signed)
Blood pressure is controlled.  Continue doxazosin 8 mg daily, labetalol 600 mg in the morning and 400 mg in the evening.

## 2022-01-26 NOTE — Assessment & Plan Note (Signed)
Volume status appears stable.  He is NYHA II.  He started walking on a daily basis and feels better.  He has had difficulty taking potassium tablets.  He had a recent potassium that was low.  He has not had a potassium pill for the past 2 days.  Obtain BMET today.  If his potassium continues to run low, I will place him on spironolactone.  Continue torsemide 20 mg twice daily.  At some point, we can consider adding SGLT2 inhibitor.

## 2022-01-27 ENCOUNTER — Telehealth: Payer: Self-pay | Admitting: *Deleted

## 2022-01-27 DIAGNOSIS — Z79899 Other long term (current) drug therapy: Secondary | ICD-10-CM

## 2022-01-27 MED ORDER — POTASSIUM CHLORIDE CRYS ER 20 MEQ PO TBCR: 20.0000 meq | EXTENDED_RELEASE_TABLET | Freq: Two times a day (BID) | ORAL | 3 refills | Status: DC

## 2022-01-27 MED ORDER — SPIRONOLACTONE 25 MG PO TABS: 25.0000 mg | ORAL_TABLET | Freq: Every day | ORAL | 3 refills | Status: DC

## 2022-01-27 MED ORDER — SPIRONOLACTONE 25 MG PO TABS: 25.0000 mg | ORAL_TABLET | Freq: Every day | ORAL | 0 refills | Status: DC

## 2022-01-27 NOTE — Telephone Encounter (Signed)
See BMET result note from 01/26/2022 Richardson Dopp, PA-C    01/27/2022 8:25 AM

## 2022-01-27 NOTE — Telephone Encounter (Signed)
-----   Message from Liliane Shi, [place] sent at 01/27/2022  8:25 AM EDT ----- Critical K+ was called last PM to APP on call - see phone note.  Pt was instructed to take 4 tabs (total 80 mEq) last PM and 3 tabs (total 60 mEq) this AM, then resume 40 mEq twice daily. Creatinine normal. PLAN:  -Start Spironolactone 25 mg once daily  -Decrease K+ to 20 mEq twice daily  -BMET Friday 01/29/22 -BMET next Tuesday 02/02/22 -BMET Tuesday 02/09/22 Richardson Dopp, PA-C    01/27/2022 8:15 AM

## 2022-01-28 ENCOUNTER — Encounter: Payer: Self-pay | Admitting: Oncology

## 2022-01-28 ENCOUNTER — Encounter (HOSPITAL_COMMUNITY): Payer: Self-pay | Admitting: Internal Medicine

## 2022-01-29 ENCOUNTER — Other Ambulatory Visit: Payer: Medicare PPO

## 2022-01-29 DIAGNOSIS — Z79899 Other long term (current) drug therapy: Secondary | ICD-10-CM

## 2022-01-29 LAB — BASIC METABOLIC PANEL
BUN/Creatinine Ratio: 16 (ref 10–24)
BUN: 21 mg/dL (ref 8–27)
CO2: 29 mmol/L (ref 20–29)
Calcium: 9.1 mg/dL (ref 8.6–10.2)
Chloride: 104 mmol/L (ref 96–106)
Creatinine, Ser: 1.28 mg/dL — ABNORMAL HIGH (ref 0.76–1.27)
Glucose: 113 mg/dL — ABNORMAL HIGH (ref 70–99)
Potassium: 4.7 mmol/L (ref 3.5–5.2)
Sodium: 143 mmol/L (ref 134–144)
eGFR: 59 mL/min/{1.73_m2} — ABNORMAL LOW (ref >59)

## 2022-02-01 ENCOUNTER — Telehealth: Payer: Self-pay

## 2022-02-01 MED ORDER — TORSEMIDE 20 MG PO TABS: ORAL_TABLET | ORAL | 3 refills | Status: DC

## 2022-02-01 MED ORDER — POTASSIUM CHLORIDE CRYS ER 20 MEQ PO TBCR: 20.0000 meq | EXTENDED_RELEASE_TABLET | Freq: Every day | ORAL | 3 refills | Status: AC

## 2022-02-01 NOTE — Telephone Encounter (Signed)
The patient has been notified of the result and verbalized understanding.  All questions (if any) were answered. Ewell Poe [place], RN 02/01/2022 1:03 PM   Will update medication list.

## 2022-02-01 NOTE — Telephone Encounter (Signed)
-----   Message from Liliane Shi, [place] sent at 01/31/2022 11:02 PM EDT ----- Creatinine increased some.  K+ normal.   PLAN:  -Decrease K+ to 20 mEq once daily  -Decrease Torsemide to 20 mg in A and 10 mg in P -BMET 1 week  Richardson Dopp, PA-C    01/31/2022 10:59 PM

## 2022-02-02 ENCOUNTER — Other Ambulatory Visit (HOSPITAL_COMMUNITY): Payer: Self-pay

## 2022-02-02 ENCOUNTER — Other Ambulatory Visit: Payer: Medicare PPO

## 2022-02-02 ENCOUNTER — Telehealth (HOSPITAL_COMMUNITY): Payer: Self-pay | Admitting: *Deleted

## 2022-02-02 NOTE — Telephone Encounter (Signed)
Patient given detailed instructions per Myocardial Perfusion Study Information Sheet for the test on 02/10/22 at 0715. Patient notified to arrive 15 minutes early and that it is imperative to arrive on time for appointment to keep from having the test rescheduled.  If you need to cancel or reschedule your appointment, please call the office within 24 hours of your appointment. . Patient verbalized understanding.Tamera Pingley, [place]

## 2022-02-03 ENCOUNTER — Other Ambulatory Visit (HOSPITAL_COMMUNITY): Payer: Self-pay

## 2022-02-03 ENCOUNTER — Other Ambulatory Visit: Payer: Self-pay | Admitting: Oncology

## 2022-02-03 DIAGNOSIS — C61 Malignant neoplasm of prostate: Secondary | ICD-10-CM

## 2022-02-03 MED ORDER — ABIRATERONE ACETATE 250 MG PO TABS
ORAL_TABLET | Freq: Every day | ORAL | 0 refills | Status: DC
  Filled 2022-02-03: qty 120, 30d supply, fill #0

## 2022-02-09 ENCOUNTER — Other Ambulatory Visit: Payer: Medicare PPO

## 2022-02-10 ENCOUNTER — Encounter (HOSPITAL_COMMUNITY): Payer: Self-pay | Admitting: Internal Medicine

## 2022-02-10 ENCOUNTER — Ambulatory Visit (HOSPITAL_BASED_OUTPATIENT_CLINIC_OR_DEPARTMENT_OTHER): Payer: Medicare PPO

## 2022-02-10 ENCOUNTER — Encounter: Payer: Self-pay | Admitting: Oncology

## 2022-02-10 ENCOUNTER — Ambulatory Visit (HOSPITAL_COMMUNITY): Payer: Medicare PPO | Attending: Cardiology

## 2022-02-10 ENCOUNTER — Other Ambulatory Visit: Payer: Medicare PPO

## 2022-02-10 DIAGNOSIS — I350 Nonrheumatic aortic (valve) stenosis: Secondary | ICD-10-CM | POA: Insufficient documentation

## 2022-02-10 DIAGNOSIS — I2510 Atherosclerotic heart disease of native coronary artery without angina pectoris: Secondary | ICD-10-CM

## 2022-02-10 DIAGNOSIS — I10 Essential (primary) hypertension: Secondary | ICD-10-CM

## 2022-02-10 DIAGNOSIS — I2729 Other secondary pulmonary hypertension: Secondary | ICD-10-CM

## 2022-02-10 DIAGNOSIS — I5032 Chronic diastolic (congestive) heart failure: Secondary | ICD-10-CM

## 2022-02-10 DIAGNOSIS — R072 Precordial pain: Secondary | ICD-10-CM | POA: Diagnosis not present

## 2022-02-10 DIAGNOSIS — E785 Hyperlipidemia, unspecified: Secondary | ICD-10-CM | POA: Insufficient documentation

## 2022-02-10 DIAGNOSIS — Z79899 Other long term (current) drug therapy: Secondary | ICD-10-CM

## 2022-02-10 LAB — BASIC METABOLIC PANEL
BUN/Creatinine Ratio: 21 (ref 10–24)
BUN: 29 mg/dL — ABNORMAL HIGH (ref 8–27)
CO2: 23 mmol/L (ref 20–29)
Calcium: 9.1 mg/dL (ref 8.6–10.2)
Chloride: 103 mmol/L (ref 96–106)
Creatinine, Ser: 1.38 mg/dL — ABNORMAL HIGH (ref 0.76–1.27)
Glucose: 68 mg/dL — ABNORMAL LOW (ref 70–99)
Potassium: 4.1 mmol/L (ref 3.5–5.2)
Sodium: 141 mmol/L (ref 134–144)
eGFR: 54 mL/min/{1.73_m2} — ABNORMAL LOW (ref >59)

## 2022-02-10 LAB — MYOCARDIAL PERFUSION IMAGING
LV dias vol: 123 mL (ref 62–150)
LV sys vol: 39 mL
Nuc Stress EF: 69 %
Peak HR: 75 {beats}/min
Rest HR: 59 {beats}/min
Rest Nuclear Isotope Dose: 10.2 mCi
SDS: 0
SRS: 0
SSS: 0
ST Depression (mm): 0 mm
Stress Nuclear Isotope Dose: 31.4 mCi
TID: 1

## 2022-02-10 LAB — ECHOCARDIOGRAM COMPLETE
AR max vel: 1.52 cm2
AV Area VTI: 2.05 cm2
AV Area mean vel: 1.46 cm2
AV Mean grad: 7 mmHg
AV Peak grad: 14.1 mmHg
Ao pk vel: 1.88 m/s
Area-P 1/2: 3.91 cm2
Height: 68 in
P 1/2 time: 469 msec
S' Lateral: 3.4 cm
Weight: 3216 oz

## 2022-02-10 MED ORDER — TECHNETIUM TC 99M TETROFOSMIN IV KIT
31.4000 | PACK | Freq: Once | INTRAVENOUS | Status: AC | PRN
  Administered 2022-02-10: 31.4 via INTRAVENOUS

## 2022-02-10 MED ORDER — TECHNETIUM TC 99M TETROFOSMIN IV KIT
10.2000 | PACK | Freq: Once | INTRAVENOUS | Status: AC | PRN
  Administered 2022-02-10: 10.2 via INTRAVENOUS

## 2022-02-10 MED ORDER — REGADENOSON 0.4 MG/5ML IV SOLN
0.4000 mg | Freq: Once | INTRAVENOUS | Status: AC
  Administered 2022-02-10: 0.4 mg via INTRAVENOUS

## 2022-02-11 ENCOUNTER — Telehealth: Payer: Self-pay

## 2022-02-11 DIAGNOSIS — R7989 Other specified abnormal findings of blood chemistry: Secondary | ICD-10-CM

## 2022-02-11 DIAGNOSIS — Z79899 Other long term (current) drug therapy: Secondary | ICD-10-CM

## 2022-02-11 MED ORDER — TORSEMIDE 20 MG PO TABS: ORAL_TABLET | ORAL | 3 refills | Status: DC

## 2022-02-11 MED ORDER — TORSEMIDE 20 MG PO TABS
20.0000 mg | ORAL_TABLET | Freq: Every day | ORAL | 3 refills | Status: DC
Start: 2022-02-11 — End: 2022-02-18

## 2022-02-11 MED ORDER — SPIRONOLACTONE 25 MG PO TABS: 12.5000 mg | ORAL_TABLET | Freq: Every day | ORAL | 3 refills | Status: DC

## 2022-02-11 NOTE — Telephone Encounter (Signed)
The patient has been notified of the result and verbalized understanding.  All questions (if any) were answered. Matthew Poe [place], RN 02/11/2022 11:41 AM   Updated medication list updated. Order placed for BMET.

## 2022-02-11 NOTE — Telephone Encounter (Signed)
-----   Message from Liliane Shi, PA-C sent at 02/11/2022  7:57 AM EDT ----- Creatinine increased more. K+ normal.  PLAN:  -Decrease Torsemide to 20 mg once daily. -Decrease Spironolactone to 12.5 mg once daily. -BMET 1 week. -Weigh daily.  Call if wt increases > 3 lbs in 1 day. Richardson Dopp, PA-C    02/11/2022 7:53 AM

## 2022-02-18 ENCOUNTER — Other Ambulatory Visit: Payer: Self-pay

## 2022-02-18 ENCOUNTER — Other Ambulatory Visit: Payer: Self-pay | Admitting: Nurse Practitioner

## 2022-02-18 DIAGNOSIS — N179 Acute kidney failure, unspecified: Secondary | ICD-10-CM | POA: Insufficient documentation

## 2022-02-18 MED ORDER — TORSEMIDE 20 MG PO TABS: 20.0000 mg | ORAL_TABLET | Freq: Every day | ORAL | 3 refills | Status: DC

## 2022-02-18 NOTE — Progress Notes (Signed)
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 Echo 5/22: EF 55-60, Gr 2 DD, RVSP 46, severe LAE, mild MR, AVR ok (trivial AI) Heart failure with preserved ejection fraction  Coronary artery disease  Cath 6/21: mod non-obs CAD  Pulmonary hypertension Diabetes mellitus  Hypertension  Hyperlipidemia Hypothyroidism  Erectile dysfunction Metastatic prostate CA on suppression chemotherapy  Hx of LGI bleed 01/2020 Colo w cecal telangiectasias >> treated w APC S/p PRBC transfusions   Prior CV Studies: GATED SPECT MYO PERF W/LEXISCAN STRESS 1D 02/10/2022 No ischemia or infarction, EF 69, low risk   ECHO COMPLETE WO IMAGING ENHANCING AGENT 02/10/2022 EF 60-65, no RWMA, moderate LVH, normal RVSF, mildly elevated PASP, severe LAE, mild RAE, mild MR, RVSP 36.4, atypical location of aortic valve and LVOT with evidence of turbulent flow and trivial paravalvular leak adjacent PA and likely prior side of aorta-PA fistula, mild central AI, further testing needed to rule out residual aorto to PA fistula or possible valve dehiscence    Echocardiogram 12/24/2020 EF 55-60, no RWMA, mild LVH, GRII DD, normal RVSF, moderately elevated PASP, severe LAE, mild RAE, mild MR AVR with trivial AI and no aortic stenosis, RVSP 46   Echocardiogram 06/24/2020 EF 55-60, mild conc LVH, Gr 2 DD, normal RVSF, mild LAE, trivial MR, AVR functioning normally (mean gradient 5 mmHg)   Cardiac catheterization 02/13/20 LAD mid  50 LCx prox 50 RCA mid 45   Carotid US 02/20/20 Bilat ICA 1-39   ABIs 8/18:   Normal    Nuclear stress test 03/25/17 Probably normal, low risk stress nuclear study with inferobasal thinning but no significant ischemia; EF 64 with normal wall motion.   Abdominal MRA 8/13 1.  Negative for renal artery stenosis or other lesion to suggest renovascular component of hypertension.   Renal Art Duplex 6/13 No RAS; normal caliber abd aorta  History of Present Illness:   Matthew Serrano. is a 76 y.o. male with the above problem list.  He was last seen on 01/26/2022 with symptoms of chest discomfort.  His symptoms sounded noncardiac but he did have some changes on his electrocardiogram.  Nuclear stress test was obtained and demonstrated no ischemia.  He also continued to have a prominent murmur and a follow-up echocardiogram was obtained.  His ejection fraction is normal.  He has mildly elevated PASP and mild mitral regurgitation.  In regards to his aortic valve prosthesis, the study was difficult given the atypical location of the aortic valve and LVOT.  There appeared to be paravalvular leak and mild aortic insufficiency.  Of note, his study is still being reviewed by noninvasive cardiology to determine the best follow-up testing.  When I last saw him, he noted difficulty taking potassium tablets.  His potassium was significantly low and I placed him on spironolactone.  He returns for follow-up.  He is here alone today.  He has not had exertional chest discomfort.  He does have some  chest discomfort with swallowing.  He also notes dysphagia.  He has had to vomit in the past to get food out.  He has no dysphagia with liquids.  He does have a lot of gas and indigestion.  He has not had syncope.  Lower extremity edema is stable.     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   History of nuclear stress test    Myoview 8/18: EF 64, inferobasal thinning, no ischemia, low risk   HYPERLIPIDEMIA 07/06/2007   HYPERTENSION 07/06/2007   HYPOTHYROIDISM 07/06/2007   Leg pain    ABIs 8/18:  Normal    Obesity    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 TAKE ON AN EMPTY STOMACH 1 HOUR BEFORE OR 2 HOURS AFTER A MEAL   aspirin EC 81 MG tablet Take 81 mg by mouth daily. Swallow whole.   atorvastatin (LIPITOR) 80 MG tablet Take 1 tablet (80 mg total) by mouth daily.   Calcium Carbonate (CALCIUM 600 PO) Take 2 tablets by mouth daily.   cholecalciferol (VITAMIN D3) 25 MCG (1000 UT) tablet Take 1,000 Units by mouth daily.   doxazosin (CARDURA) 8 MG tablet TAKE 1 TABLET AT BEDTIME   ferrous sulfate 325 (65 FE) MG EC tablet Take 1 tablet (325 mg total) by mouth 2 (two) times daily before a meal.   glipiZIDE (GLUCOTROL XL) 10 MG 24 hr tablet TAKE 1 TABLET TWICE DAILY   labetalol (NORMODYNE) 200 MG tablet TAKE 3 TABLETS EVERY MORNING AND TAKE 2 TABLETS EVERY EVENING   levothyroxine (SYNTHROID) 125 MCG tablet Take 1 tablet (125 mcg total) by mouth daily.   metFORMIN (GLUCOPHAGE) 1000 MG tablet TAKE 1 TABLET TWICE DAILY WITH MEALS (NEED MD APPOINTMENT FOR REFILLS)   metoprolol tartrate (LOPRESSOR) 50 MG tablet TAKE 1 TABLET BY MOUTH 2 HOURS PRIOR TO THE CARDIAC CT   omeprazole (PRILOSEC) 40 MG  capsule Take 1 tablet by mouth twice a day X's 2 weeks then reduce back to 1 tablet by mouth daily   potassium chloride SA (KLOR-CON M) 20 MEQ tablet Take 1 tablet (20 mEq total) by mouth daily.   predniSONE (DELTASONE) 5 MG tablet TAKE 1 TABLET BY MOUTH ONCE A DAY WITH BREAKFAST   spironolactone (ALDACTONE) 25 MG tablet Take 0.5 tablets (12.5 mg total) by mouth daily.   torsemide (DEMADEX) 20 MG tablet Take 1 tablet (20 mg total) by mouth daily.    Allergies:   Patient has no known allergies.   Social History   Tobacco Use   Smoking status: Former   Smokeless tobacco: Never   Tobacco comments:    quit 2005  Vaping Use   Vaping Use: Never used  Substance Use Topics   Alcohol use: Not Currently  Alcohol/week: 14.0 standard drinks of alcohol    Types: 14 Cans of beer per week   Drug use: No    Family Hx: The patient's family history includes Aneurysm in his mother; Breast cancer in his brother, sister, and another family member; Hypertension in an other family member; Prostate cancer in his father. There is no history of Colon cancer, Esophageal cancer, Stomach cancer, or Rectal cancer.  Review of Systems  Gastrointestinal:  Negative for hematochezia.  Genitourinary:  Negative for hematuria.     EKGs/Labs/Other Test Reviewed:    EKG:  EKG is not ordered today.  The ekg ordered today demonstrates n/a  Recent Labs: 07/30/2021: TSH 1.51 12/25/2021: ALT 16; Hemoglobin 11.2; Platelet Count 191 02/10/2022: BUN 29; Creatinine, Ser 1.38; Potassium 4.1; Sodium 141   Recent Lipid Panel Recent Labs    07/30/21 0736  CHOL 133  TRIG 75.0  HDL 62.00  VLDL 15.0  LDLCALC 56     Risk Assessment/Calculations/Metrics:              Physical Exam:    VS:  BP 120/62   Pulse 60   Ht '5\' 8"'$  (1.727 m)   Wt 200 lb 3.2 oz (90.8 kg)   SpO2 98%   BMI 30.44 kg/m     Wt Readings from Last 3 Encounters:  02/19/22 200 lb 3.2 oz (90.8 kg)  02/10/22 201 lb (91.2 kg)  01/26/22 201 lb 12.8  oz (91.5 kg)    Constitutional:      Appearance: Healthy appearance. Not in distress.  Neck:     Vascular: No JVR. JVD normal.  Pulmonary:     Effort: Pulmonary effort is normal.     Breath sounds: No wheezing. No rales.  Cardiovascular:     Normal rate. Regular rhythm. Normal S1. Normal S2.      Murmurs: There is a grade 3/6 holosystolic murmur at the URSB and ULSB.  Edema:    Peripheral edema present.    Ankle: bilateral trace edema of the ankle. Abdominal:     Palpations: Abdomen is soft.  Skin:    General: Skin is warm and dry.  Neurological:     General: No focal deficit present.     Mental Status: Alert and oriented to person, place and time.         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  He has had a loud systolic murmur on exam and recent repeat echocardiogram is concerning either for valve dehiscence or residual fistula.  I discussed his echocardiogram today with Dr. Margaretann Loveless who has reviewed the study extensively.  He needs a coronary CTA for valve assessment with TAVR protocol as soon as possible.  I reviewed this with the patient.  I also explained that if there are significant abnormalities on his CT, he will likely need a transesophageal echocardiogram.  We reviewed the risks and benefits today just in case.  He is currently stable.  We will work on getting his CT done as soon as possible.  Of note, he does note dysphagia.  I am not certain if this could be related to the current findings on his echocardiogram.  This has been going on for several months.  If his CT scan does not show anything significantly abnormal, we will need  to refer him to gastroenterology.  (HFpEF) heart failure with preserved ejection fraction (HCC) Volume status is overall  stable.  He did have significant hypokalemia recently.  I placed him on spironolactone.  He has had some elevated creatinines.  Repeat BMET will be obtained today.  He is having difficulty taking spironolactone 12.5 mg daily.  I advised him to change this to 25 mg every other day.  Continue torsemide 20 mg daily.  CAD (coronary artery disease) He had nonobstructive CAD prior to his surgery.  Recent stress test was obtained due to chest discomfort.  This was low risk and negative for ischemia.  His symptoms are mainly related to swallowing.  As noted, we will assess abnormalities found on his echocardiogram first.  If there are no significant findings, he will need referral to gastroenterology for his chest discomfort and dysphagia.  Continue aspirin 81 mg daily, Lipitor 80 mg daily, labetalol 600 mg in the morning and 400 mg in the evening.  Essential hypertension The patient's blood pressure is controlled on his current regimen.  Continue current therapy.   Hyperlipidemia LDL goal <70 LDL in December was 56.  Continue atorvastatin 80 mg daily.  Other secondary pulmonary hypertension (HCC) Mild pulmonary hypertension by recent echocardiogram  AKI (acute kidney injury) ([place]) As noted, recent creatinine increased with initiation of spironolactone.  Repeat BMET will be obtained today.           Dispo:  Return in about 3 months (around 05/22/2022) for Routine Follow Up, w/ Dr. Burt Knack.   Medication Adjustments/Labs and Tests Ordered: Current medicines are reviewed at length with the patient today.  Concerns regarding medicines are outlined above.  Tests Ordered: Orders Placed This Encounter  Procedures   CT CORONARY MORPH W/CTA COR W/SCORE W/CA W/CM &/OR WO/CM   Medication Changes: Meds ordered this encounter  Medications   metoprolol tartrate (LOPRESSOR) 50 MG tablet    Sig: TAKE 1 TABLET BY MOUTH 2 HOURS PRIOR TO THE CARDIAC CT    Dispense:  1 tablet    Refill:  0   Signed, Richardson Dopp, PA-C  02/19/2022 1:16 PM    [facility] [place], [place], [place]  [address] Phone: [phone]; Fax: [phone]

## 2022-02-19 ENCOUNTER — Encounter: Payer: Self-pay | Admitting: Physician Assistant

## 2022-02-19 ENCOUNTER — Ambulatory Visit (INDEPENDENT_AMBULATORY_CARE_PROVIDER_SITE_OTHER): Payer: Medicare PPO | Admitting: Physician Assistant

## 2022-02-19 ENCOUNTER — Other Ambulatory Visit: Payer: Medicare PPO

## 2022-02-19 VITALS — BP 120/62 | HR 60 | Ht 68.0 in | Wt 200.2 lb

## 2022-02-19 DIAGNOSIS — Z79899 Other long term (current) drug therapy: Secondary | ICD-10-CM

## 2022-02-19 DIAGNOSIS — I350 Nonrheumatic aortic (valve) stenosis: Secondary | ICD-10-CM | POA: Diagnosis not present

## 2022-02-19 DIAGNOSIS — I2510 Atherosclerotic heart disease of native coronary artery without angina pectoris: Secondary | ICD-10-CM

## 2022-02-19 DIAGNOSIS — R7989 Other specified abnormal findings of blood chemistry: Secondary | ICD-10-CM | POA: Diagnosis not present

## 2022-02-19 DIAGNOSIS — I2729 Other secondary pulmonary hypertension: Secondary | ICD-10-CM

## 2022-02-19 DIAGNOSIS — N179 Acute kidney failure, unspecified: Secondary | ICD-10-CM

## 2022-02-19 DIAGNOSIS — E785 Hyperlipidemia, unspecified: Secondary | ICD-10-CM

## 2022-02-19 DIAGNOSIS — I10 Essential (primary) hypertension: Secondary | ICD-10-CM | POA: Diagnosis not present

## 2022-02-19 DIAGNOSIS — I5032 Chronic diastolic (congestive) heart failure: Secondary | ICD-10-CM | POA: Diagnosis not present

## 2022-02-19 DIAGNOSIS — Z952 Presence of prosthetic heart valve: Secondary | ICD-10-CM

## 2022-02-19 MED ORDER — METOPROLOL TARTRATE 50 MG PO TABS: ORAL_TABLET | ORAL | 0 refills | Status: DC

## 2022-02-19 NOTE — Assessment & Plan Note (Signed)
The patient's blood pressure is controlled on his current regimen.  Continue current therapy.  

## 2022-02-19 NOTE — Assessment & Plan Note (Signed)
He had nonobstructive CAD prior to his surgery.  Recent stress test was obtained due to chest discomfort.  This was low risk and negative for ischemia.  His symptoms are mainly related to swallowing.  As noted, we will assess abnormalities found on his echocardiogram first.  If there are no significant findings, he will need referral to gastroenterology for his chest discomfort and dysphagia.  Continue aspirin 81 mg daily, Lipitor 80 mg daily, labetalol 600 mg in the morning and 400 mg in the evening.

## 2022-02-19 NOTE — Assessment & Plan Note (Signed)
LDL in December was 56.  Continue atorvastatin 80 mg daily.

## 2022-02-19 NOTE — Assessment & Plan Note (Signed)
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  He has had a loud systolic murmur on exam and recent repeat echocardiogram is concerning either for valve dehiscence or residual fistula.  I discussed his echocardiogram today with Dr. Margaretann Loveless who has reviewed the study extensively.  He needs a coronary CTA for valve assessment with TAVR protocol as soon as possible.  I reviewed this with the patient.  I also explained that if there are significant abnormalities on his CT, he will likely need a transesophageal echocardiogram.  We reviewed the risks and benefits today just in case.  He is currently stable.  We will work on getting his CT done as soon as possible.  Of note, he does note dysphagia.  I am not certain if this could be related to the current findings on his echocardiogram.  This has been going on for several months.  If his CT scan does not show anything significantly abnormal, we will need to refer him to gastroenterology.

## 2022-02-19 NOTE — Assessment & Plan Note (Signed)
As noted, recent creatinine increased with initiation of spironolactone.  Repeat BMET will be obtained today.

## 2022-02-19 NOTE — Patient Instructions (Addendum)
Medication Instructions:  Your physician recommends that you continue on your current medications as directed. Please refer to the Current Medication list given to you today.  *If you need a refill on your cardiac medications before your next appointment, please call your pharmacy*   Lab Work: TODAY:  BMET (previously ordered)  If you have labs (blood work) drawn today and your tests are completely normal, you will receive your results only by: [place] (if you have MyChart) OR A paper copy in the mail If you have any lab test that is abnormal or we need to change your treatment, we will call you to review the results.   Testing/Procedures: Your physician has requested that you have cardiac CT. Cardiac computed tomography (CT) is a painless test that uses an x-ray machine to take clear, detailed pictures of your heart. For further information please visit [email or web address]. Please follow instruction sheet BELOW.    Your cardiac CT will be scheduled at one of the below locations:   [facility] [address] [place], [place] [address] [phone]   Please arrive at the [facility] (Entrance C2) of [facility] 30 minutes prior to test start time. You can use the FREE valet parking offered at entrance C (encouraged to control the heart rate for the test)  Proceed to the [facility] Radiology Department (first floor) to check-in and test prep.  All radiology patients and guests should use entrance C2 at [facility], accessed from [facility], even though the hospital's physical address listed is [address].     Please follow these instructions carefully (unless otherwise directed):  Hold all erectile dysfunction medications at least 3 days (72 hrs) prior to test.  On the Night Before the Test: Be sure to Drink plenty of water. Do not consume any caffeinated/decaffeinated beverages or chocolate 12  hours prior to your test. Do not take any antihistamines 12 hours prior to your test. HOLD TORSEMIDE on the morning of the test   On the Day of the Test: Drink plenty of water until 1 hour prior to the test. Do not eat any food 4 hours prior to the test. You may take your regular medications prior to the test.  Take metoprolol (Lopressor) 50 MG two hours prior to test ( I HAVE SENT THIS TO [facility])   After the Test: Drink plenty of water. After receiving IV contrast, you may experience a mild flushed feeling. This is normal. On occasion, you may experience a mild rash up to 24 hours after the test. This is not dangerous. If this occurs, you can take Benadryl 25 mg and increase your fluid intake. If you experience trouble breathing, this can be serious. If it is severe call 911 IMMEDIATELY. If it is mild, please call our office. If you take any of these medications: Glipizide/Metformin, Avandament, Glucavance, please do not take 48 hours after completing test unless otherwise instructed.  We will call to schedule your test 2-4 weeks out understanding that some insurance companies will need an authorization prior to the service being performed.   For non-scheduling related questions, please contact the cardiac imaging nurse navigator should you have any questions/concerns: Marchia Bond, Cardiac Imaging Nurse Navigator Gordy Clement, Cardiac Imaging Nurse Navigator [facility] Direct Office Dial: [phone]   For scheduling needs, including cancellations and rescheduling, please call [place], [phone].   Follow-Up: At [facility], you and your health needs are  our priority.  As part of our continuing mission to provide you with exceptional heart care, we have created designated Provider Care Teams.  These Care Teams include your primary Cardiologist (physician) and Advanced Practice Providers (APPs -  Physician Assistants and Nurse Practitioners) who all  work together to provide you with the care you need, when you need it.  We recommend signing up for the patient portal called "MyChart".  Sign up information is provided on this After Visit Summary.  MyChart is used to connect with patients for Virtual Visits (Telemedicine).  Patients are able to view lab/test results, encounter notes, upcoming appointments, etc.  Non-urgent messages can be sent to your provider as well.   To learn more about what you can do with MyChart, go to [email or web address].    Your next appointment:   3 month(s)  05/24/22 ARRIVE AT 8:05  The format for your next appointment:   In Person  Provider:   Dr. Burt Knack  Other Instructions   Important Information About Sugar

## 2022-02-19 NOTE — Assessment & Plan Note (Signed)
Volume status is overall stable.  He did have significant hypokalemia recently.  I placed him on spironolactone.  He has had some elevated creatinines.  Repeat BMET will be obtained today.  He is having difficulty taking spironolactone 12.5 mg daily.  I advised him to change this to 25 mg every other day.  Continue torsemide 20 mg daily.

## 2022-02-19 NOTE — Assessment & Plan Note (Signed)
Mild pulmonary hypertension by recent echocardiogram

## 2022-02-20 LAB — BASIC METABOLIC PANEL
BUN/Creatinine Ratio: 18 (ref 10–24)
BUN: 20 mg/dL (ref 8–27)
CO2: 23 mmol/L (ref 20–29)
Calcium: 9.2 mg/dL (ref 8.6–10.2)
Chloride: 107 mmol/L — ABNORMAL HIGH (ref 96–106)
Creatinine, Ser: 1.12 mg/dL (ref 0.76–1.27)
Glucose: 112 mg/dL — ABNORMAL HIGH (ref 70–99)
Potassium: 4.7 mmol/L (ref 3.5–5.2)
Sodium: 144 mmol/L (ref 134–144)
eGFR: 69 mL/min/{1.73_m2} (ref >59)

## 2022-02-22 ENCOUNTER — Other Ambulatory Visit (HOSPITAL_COMMUNITY): Payer: Self-pay

## 2022-02-22 ENCOUNTER — Telehealth (HOSPITAL_COMMUNITY): Payer: Self-pay | Admitting: *Deleted

## 2022-02-22 NOTE — Telephone Encounter (Signed)
Attempted to call patient regarding upcoming cardiac CT appointment. °Left message on voicemail with name and callback number ° °Sheron Robin RN Navigator Cardiac Imaging °[facility] °[phone] Office °[phone] Cell ° °

## 2022-02-24 ENCOUNTER — Telehealth (HOSPITAL_COMMUNITY): Payer: Self-pay | Admitting: *Deleted

## 2022-02-24 NOTE — Telephone Encounter (Signed)
Reaching out to patient to offer assistance regarding upcoming cardiac imaging study; pt verbalizes understanding of appt date/time, parking situation and where to check in, pre-test NPO status and verified current allergies; name and call back number provided for further questions should they arise  Matthew Clement RN Navigator Cardiac Imaging [facility] [phone] office [phone] cell  Patient to take his morning labetolol two hours prior to his cardiac CT scan. He is aware to arrive at 7:30am.

## 2022-02-25 ENCOUNTER — Other Ambulatory Visit: Payer: Self-pay | Admitting: Cardiovascular Disease

## 2022-02-25 ENCOUNTER — Encounter (HOSPITAL_COMMUNITY): Payer: Self-pay

## 2022-02-25 ENCOUNTER — Ambulatory Visit (HOSPITAL_COMMUNITY)
Admission: RE | Admit: 2022-02-25 | Discharge: 2022-02-25 | Disposition: A | Discharge status: Home / Self Care | Payer: Medicare PPO | Source: Ambulatory Visit | Attending: Physician Assistant | Admitting: Physician Assistant

## 2022-02-25 DIAGNOSIS — I38 Endocarditis, valve unspecified: Secondary | ICD-10-CM | POA: Diagnosis present

## 2022-02-25 DIAGNOSIS — R1310 Dysphagia, unspecified: Secondary | ICD-10-CM | POA: Diagnosis present

## 2022-02-25 DIAGNOSIS — B954 Other streptococcus as the cause of diseases classified elsewhere: Secondary | ICD-10-CM | POA: Diagnosis present

## 2022-02-25 DIAGNOSIS — I350 Nonrheumatic aortic (valve) stenosis: Secondary | ICD-10-CM

## 2022-02-25 DIAGNOSIS — Z87891 Personal history of nicotine dependence: Secondary | ICD-10-CM

## 2022-02-25 DIAGNOSIS — E039 Hypothyroidism, unspecified: Secondary | ICD-10-CM | POA: Diagnosis present

## 2022-02-25 DIAGNOSIS — I130 Hypertensive heart and chronic kidney disease with heart failure and stage 1 through stage 4 chronic kidney disease, or unspecified chronic kidney disease: Secondary | ICD-10-CM | POA: Diagnosis present

## 2022-02-25 DIAGNOSIS — I2510 Atherosclerotic heart disease of native coronary artery without angina pectoris: Secondary | ICD-10-CM

## 2022-02-25 DIAGNOSIS — N179 Acute kidney failure, unspecified: Secondary | ICD-10-CM

## 2022-02-25 DIAGNOSIS — K5900 Constipation, unspecified: Secondary | ICD-10-CM | POA: Diagnosis present

## 2022-02-25 DIAGNOSIS — R931 Abnormal findings on diagnostic imaging of heart and coronary circulation: Secondary | ICD-10-CM

## 2022-02-25 DIAGNOSIS — Y831 Surgical operation with implant of artificial internal device as the cause of abnormal reaction of the patient, or of later complication, without mention of misadventure at the time of the procedure: Secondary | ICD-10-CM | POA: Diagnosis present

## 2022-02-25 DIAGNOSIS — Z8546 Personal history of malignant neoplasm of prostate: Secondary | ICD-10-CM | POA: Diagnosis not present

## 2022-02-25 DIAGNOSIS — I7789 Other specified disorders of arteries and arterioles: Secondary | ICD-10-CM | POA: Diagnosis present

## 2022-02-25 DIAGNOSIS — I10 Essential (primary) hypertension: Secondary | ICD-10-CM | POA: Diagnosis not present

## 2022-02-25 DIAGNOSIS — Z803 Family history of malignant neoplasm of breast: Secondary | ICD-10-CM | POA: Diagnosis not present

## 2022-02-25 DIAGNOSIS — E669 Obesity, unspecified: Secondary | ICD-10-CM | POA: Diagnosis present

## 2022-02-25 DIAGNOSIS — I088 Other rheumatic multiple valve diseases: Secondary | ICD-10-CM | POA: Diagnosis not present

## 2022-02-25 DIAGNOSIS — E119 Type 2 diabetes mellitus without complications: Secondary | ICD-10-CM | POA: Diagnosis present

## 2022-02-25 DIAGNOSIS — Z7952 Long term (current) use of systemic steroids: Secondary | ICD-10-CM

## 2022-02-25 DIAGNOSIS — T826XXA Infection and inflammatory reaction due to cardiac valve prosthesis, initial encounter: Secondary | ICD-10-CM | POA: Diagnosis present

## 2022-02-25 DIAGNOSIS — Z8042 Family history of malignant neoplasm of prostate: Secondary | ICD-10-CM

## 2022-02-25 DIAGNOSIS — I2729 Other secondary pulmonary hypertension: Secondary | ICD-10-CM | POA: Diagnosis present

## 2022-02-25 DIAGNOSIS — Q2543 Congenital aneurysm of aorta: Secondary | ICD-10-CM | POA: Diagnosis not present

## 2022-02-25 DIAGNOSIS — I330 Acute and subacute infective endocarditis: Secondary | ICD-10-CM | POA: Diagnosis present

## 2022-02-25 DIAGNOSIS — I5032 Chronic diastolic (congestive) heart failure: Secondary | ICD-10-CM | POA: Diagnosis present

## 2022-02-25 DIAGNOSIS — Z7984 Long term (current) use of oral hypoglycemic drugs: Secondary | ICD-10-CM

## 2022-02-25 DIAGNOSIS — I509 Heart failure, unspecified: Secondary | ICD-10-CM | POA: Diagnosis not present

## 2022-02-25 DIAGNOSIS — Z7982 Long term (current) use of aspirin: Secondary | ICD-10-CM

## 2022-02-25 DIAGNOSIS — L910 Hypertrophic scar: Secondary | ICD-10-CM | POA: Diagnosis present

## 2022-02-25 DIAGNOSIS — Z66 Do not resuscitate: Secondary | ICD-10-CM | POA: Diagnosis present

## 2022-02-25 DIAGNOSIS — Z6830 Body mass index (BMI) 30.0-30.9, adult: Secondary | ICD-10-CM

## 2022-02-25 DIAGNOSIS — I772 Rupture of artery: Secondary | ICD-10-CM | POA: Diagnosis present

## 2022-02-25 DIAGNOSIS — E785 Hyperlipidemia, unspecified: Secondary | ICD-10-CM

## 2022-02-25 DIAGNOSIS — Z8249 Family history of ischemic heart disease and other diseases of the circulatory system: Secondary | ICD-10-CM

## 2022-02-25 DIAGNOSIS — Z79899 Other long term (current) drug therapy: Secondary | ICD-10-CM

## 2022-02-25 DIAGNOSIS — I081 Rheumatic disorders of both mitral and tricuspid valves: Secondary | ICD-10-CM | POA: Diagnosis not present

## 2022-02-25 DIAGNOSIS — I39 Endocarditis and heart valve disorders in diseases classified elsewhere: Secondary | ICD-10-CM | POA: Diagnosis not present

## 2022-02-25 DIAGNOSIS — Z79890 Hormone replacement therapy (postmenopausal): Secondary | ICD-10-CM

## 2022-02-25 DIAGNOSIS — I5030 Unspecified diastolic (congestive) heart failure: Secondary | ICD-10-CM | POA: Diagnosis not present

## 2022-02-25 DIAGNOSIS — I351 Nonrheumatic aortic (valve) insufficiency: Secondary | ICD-10-CM | POA: Diagnosis not present

## 2022-02-25 DIAGNOSIS — C799 Secondary malignant neoplasm of unspecified site: Secondary | ICD-10-CM | POA: Diagnosis present

## 2022-02-25 DIAGNOSIS — I110 Hypertensive heart disease with heart failure: Secondary | ICD-10-CM | POA: Diagnosis not present

## 2022-02-25 DIAGNOSIS — T8209XA Other mechanical complication of heart valve prosthesis, initial encounter: Secondary | ICD-10-CM | POA: Diagnosis not present

## 2022-02-25 DIAGNOSIS — N183 Chronic kidney disease, stage 3 unspecified: Secondary | ICD-10-CM | POA: Diagnosis present

## 2022-02-25 DIAGNOSIS — I358 Other nonrheumatic aortic valve disorders: Secondary | ICD-10-CM | POA: Diagnosis not present

## 2022-02-25 DIAGNOSIS — T81718A Complication of other artery following a procedure, not elsewhere classified, initial encounter: Secondary | ICD-10-CM | POA: Diagnosis present

## 2022-02-25 DIAGNOSIS — Z952 Presence of prosthetic heart valve: Secondary | ICD-10-CM

## 2022-02-25 MED ORDER — NITROGLYCERIN 0.4 MG SL SUBL
0.8000 mg | SUBLINGUAL_TABLET | Freq: Once | SUBLINGUAL | Status: AC
  Administered 2022-02-25: 0.8 mg via SUBLINGUAL

## 2022-02-25 MED ORDER — NITROGLYCERIN 0.4 MG SL SUBL
SUBLINGUAL_TABLET | SUBLINGUAL | Status: AC
  Filled 2022-02-25: qty 2

## 2022-02-25 MED ORDER — IOHEXOL 350 MG/ML SOLN
100.0000 mL | Freq: Once | INTRAVENOUS | Status: AC | PRN
  Administered 2022-02-25: 100 mL via INTRAVENOUS

## 2022-02-25 NOTE — Progress Notes (Signed)
Pt has been made aware of normal result and verbalized understanding.  jw

## 2022-02-25 NOTE — Progress Notes (Signed)
CT FFR ordered.   [place] T. Audie Box, MD, [place]  [address] [place], [place] [address] [phone]  9:53 PM

## 2022-02-26 ENCOUNTER — Inpatient Hospital Stay (HOSPITAL_COMMUNITY)
Admission: AD | Admit: 2022-02-26 | Discharge: 2022-03-04 | DRG: 314 | Disposition: A | Discharge status: Home Health | Payer: Medicare PPO | Source: Ambulatory Visit | Attending: Cardiovascular Disease | Admitting: Cardiovascular Disease

## 2022-02-26 ENCOUNTER — Ambulatory Visit (HOSPITAL_COMMUNITY)
Admission: RE | Admit: 2022-02-26 | Discharge: 2022-02-26 | Disposition: A | Discharge status: Home / Self Care | Payer: Medicare PPO | Source: Ambulatory Visit | Attending: Cardiovascular Disease | Admitting: Cardiovascular Disease

## 2022-02-26 ENCOUNTER — Ambulatory Visit (HOSPITAL_BASED_OUTPATIENT_CLINIC_OR_DEPARTMENT_OTHER)
Admission: RE | Admit: 2022-02-26 | Discharge: 2022-02-26 | Disposition: A | Discharge status: Home / Self Care | Payer: Medicare PPO | Source: Ambulatory Visit | Attending: Cardiovascular Disease | Admitting: Cardiovascular Disease

## 2022-02-26 DIAGNOSIS — I5030 Unspecified diastolic (congestive) heart failure: Secondary | ICD-10-CM | POA: Diagnosis not present

## 2022-02-26 DIAGNOSIS — I358 Other nonrheumatic aortic valve disorders: Secondary | ICD-10-CM | POA: Diagnosis not present

## 2022-02-26 DIAGNOSIS — E785 Hyperlipidemia, unspecified: Secondary | ICD-10-CM | POA: Diagnosis present

## 2022-02-26 DIAGNOSIS — Z8249 Family history of ischemic heart disease and other diseases of the circulatory system: Secondary | ICD-10-CM | POA: Diagnosis not present

## 2022-02-26 DIAGNOSIS — T826XXA Infection and inflammatory reaction due to cardiac valve prosthesis, initial encounter: Secondary | ICD-10-CM | POA: Diagnosis present

## 2022-02-26 DIAGNOSIS — C799 Secondary malignant neoplasm of unspecified site: Secondary | ICD-10-CM | POA: Diagnosis present

## 2022-02-26 DIAGNOSIS — I330 Acute and subacute infective endocarditis: Secondary | ICD-10-CM | POA: Diagnosis present

## 2022-02-26 DIAGNOSIS — I2510 Atherosclerotic heart disease of native coronary artery without angina pectoris: Secondary | ICD-10-CM | POA: Diagnosis present

## 2022-02-26 DIAGNOSIS — I10 Essential (primary) hypertension: Secondary | ICD-10-CM | POA: Diagnosis not present

## 2022-02-26 DIAGNOSIS — I350 Nonrheumatic aortic (valve) stenosis: Secondary | ICD-10-CM

## 2022-02-26 DIAGNOSIS — I5032 Chronic diastolic (congestive) heart failure: Secondary | ICD-10-CM | POA: Diagnosis present

## 2022-02-26 DIAGNOSIS — I38 Endocarditis, valve unspecified: Secondary | ICD-10-CM

## 2022-02-26 DIAGNOSIS — I110 Hypertensive heart disease with heart failure: Secondary | ICD-10-CM | POA: Diagnosis not present

## 2022-02-26 DIAGNOSIS — I2729 Other secondary pulmonary hypertension: Secondary | ICD-10-CM | POA: Diagnosis present

## 2022-02-26 DIAGNOSIS — Z66 Do not resuscitate: Secondary | ICD-10-CM | POA: Diagnosis present

## 2022-02-26 DIAGNOSIS — Z87891 Personal history of nicotine dependence: Secondary | ICD-10-CM | POA: Diagnosis not present

## 2022-02-26 DIAGNOSIS — I509 Heart failure, unspecified: Secondary | ICD-10-CM | POA: Diagnosis not present

## 2022-02-26 DIAGNOSIS — Z79899 Other long term (current) drug therapy: Secondary | ICD-10-CM | POA: Diagnosis not present

## 2022-02-26 DIAGNOSIS — R931 Abnormal findings on diagnostic imaging of heart and coronary circulation: Secondary | ICD-10-CM

## 2022-02-26 DIAGNOSIS — Z8546 Personal history of malignant neoplasm of prostate: Secondary | ICD-10-CM | POA: Diagnosis not present

## 2022-02-26 DIAGNOSIS — I081 Rheumatic disorders of both mitral and tricuspid valves: Secondary | ICD-10-CM | POA: Diagnosis not present

## 2022-02-26 DIAGNOSIS — Z8042 Family history of malignant neoplasm of prostate: Secondary | ICD-10-CM | POA: Diagnosis not present

## 2022-02-26 DIAGNOSIS — I39 Endocarditis and heart valve disorders in diseases classified elsewhere: Secondary | ICD-10-CM | POA: Diagnosis not present

## 2022-02-26 DIAGNOSIS — I351 Nonrheumatic aortic (valve) insufficiency: Secondary | ICD-10-CM | POA: Diagnosis not present

## 2022-02-26 DIAGNOSIS — B954 Other streptococcus as the cause of diseases classified elsewhere: Secondary | ICD-10-CM | POA: Diagnosis present

## 2022-02-26 DIAGNOSIS — E039 Hypothyroidism, unspecified: Secondary | ICD-10-CM | POA: Diagnosis present

## 2022-02-26 DIAGNOSIS — N183 Chronic kidney disease, stage 3 unspecified: Secondary | ICD-10-CM | POA: Diagnosis present

## 2022-02-26 DIAGNOSIS — K5900 Constipation, unspecified: Secondary | ICD-10-CM | POA: Diagnosis present

## 2022-02-26 DIAGNOSIS — Z803 Family history of malignant neoplasm of breast: Secondary | ICD-10-CM | POA: Diagnosis not present

## 2022-02-26 DIAGNOSIS — R1310 Dysphagia, unspecified: Secondary | ICD-10-CM | POA: Diagnosis present

## 2022-02-26 DIAGNOSIS — I130 Hypertensive heart and chronic kidney disease with heart failure and stage 1 through stage 4 chronic kidney disease, or unspecified chronic kidney disease: Secondary | ICD-10-CM | POA: Diagnosis present

## 2022-02-26 DIAGNOSIS — Y831 Surgical operation with implant of artificial internal device as the cause of abnormal reaction of the patient, or of later complication, without mention of misadventure at the time of the procedure: Secondary | ICD-10-CM | POA: Diagnosis present

## 2022-02-26 DIAGNOSIS — Q2543 Congenital aneurysm of aorta: Secondary | ICD-10-CM | POA: Diagnosis not present

## 2022-02-26 DIAGNOSIS — T8209XA Other mechanical complication of heart valve prosthesis, initial encounter: Secondary | ICD-10-CM | POA: Diagnosis not present

## 2022-02-26 LAB — COMPREHENSIVE METABOLIC PANEL
ALT: 21 U/L (ref 0–44)
AST: 26 U/L (ref 15–41)
Albumin: 3.7 g/dL (ref 3.5–5.0)
Alkaline Phosphatase: 46 U/L (ref 38–126)
Anion gap: 11 (ref 5–15)
BUN: 24 mg/dL — ABNORMAL HIGH (ref 8–23)
CO2: 26 mmol/L (ref 22–32)
Calcium: 8.8 mg/dL — ABNORMAL LOW (ref 8.9–10.3)
Chloride: 105 mmol/L (ref 98–111)
Creatinine, Ser: 1.39 mg/dL — ABNORMAL HIGH (ref 0.61–1.24)
GFR, Estimated: 53 mL/min — ABNORMAL LOW (ref >60)
Glucose, Bld: 207 mg/dL — ABNORMAL HIGH (ref 70–99)
Potassium: 3.8 mmol/L (ref 3.5–5.1)
Sodium: 142 mmol/L (ref 135–145)
Total Bilirubin: 0.8 mg/dL (ref 0.3–1.2)
Total Protein: 5.9 g/dL — ABNORMAL LOW (ref 6.5–8.1)

## 2022-02-26 LAB — CBC WITH DIFFERENTIAL/PLATELET
Abs Immature Granulocytes: 0.01 10*3/uL (ref 0.00–0.07)
Basophils Absolute: 0 10*3/uL (ref 0.0–0.1)
Basophils Relative: 1 %
Eosinophils Absolute: 0.1 10*3/uL (ref 0.0–0.5)
Eosinophils Relative: 1 %
HCT: 35.1 % — ABNORMAL LOW (ref 39.0–52.0)
Hemoglobin: 11.2 g/dL — ABNORMAL LOW (ref 13.0–17.0)
Immature Granulocytes: 0 %
Lymphocytes Relative: 31 %
Lymphs Abs: 1.6 10*3/uL (ref 0.7–4.0)
MCH: 27.8 pg (ref 26.0–34.0)
MCHC: 31.9 g/dL (ref 30.0–36.0)
MCV: 87.1 fL (ref 80.0–100.0)
Monocytes Absolute: 0.5 10*3/uL (ref 0.1–1.0)
Monocytes Relative: 9 %
Neutro Abs: 3 10*3/uL (ref 1.7–7.7)
Neutrophils Relative %: 58 %
Platelets: 149 10*3/uL — ABNORMAL LOW (ref 150–400)
RBC: 4.03 MIL/uL — ABNORMAL LOW (ref 4.22–5.81)
RDW: 15.1 % (ref 11.5–15.5)
WBC: 5.3 10*3/uL (ref 4.0–10.5)
nRBC: 0 % (ref 0.0–0.2)

## 2022-02-26 LAB — GLUCOSE, CAPILLARY: Glucose-Capillary: 120 mg/dL — ABNORMAL HIGH (ref 70–99)

## 2022-02-26 LAB — HEMOGLOBIN A1C
Hgb A1c MFr Bld: 6.1 % — ABNORMAL HIGH (ref 4.8–5.6)
Mean Plasma Glucose: 128.37 mg/dL

## 2022-02-26 LAB — SEDIMENTATION RATE: Sed Rate: 3 mm/hr (ref 0–16)

## 2022-02-26 LAB — TSH: TSH: 0.301 u[IU]/mL — ABNORMAL LOW (ref 0.350–4.500)

## 2022-02-26 MED ORDER — LABETALOL HCL 200 MG PO TABS
600.0000 mg | ORAL_TABLET | Freq: Every day | ORAL | Status: DC
  Administered 2022-02-27 – 2022-03-04 (×6): 600 mg via ORAL
  Filled 2022-02-26 (×6): qty 3

## 2022-02-26 MED ORDER — ATORVASTATIN CALCIUM 80 MG PO TABS
80.0000 mg | ORAL_TABLET | Freq: Every day | ORAL | Status: DC
  Administered 2022-02-27 – 2022-03-04 (×6): 80 mg via ORAL
  Filled 2022-02-26 (×6): qty 1

## 2022-02-26 MED ORDER — NITROGLYCERIN 0.4 MG SL SUBL: 0.4000 mg | SUBLINGUAL_TABLET | SUBLINGUAL | Status: DC | PRN

## 2022-02-26 MED ORDER — ENOXAPARIN SODIUM 40 MG/0.4ML IJ SOSY
40.0000 mg | PREFILLED_SYRINGE | INTRAMUSCULAR | Status: DC
  Administered 2022-02-26 – 2022-03-03 (×6): 40 mg via SUBCUTANEOUS
  Filled 2022-02-26 (×6): qty 0.4

## 2022-02-26 MED ORDER — PANTOPRAZOLE SODIUM 40 MG PO TBEC
40.0000 mg | DELAYED_RELEASE_TABLET | Freq: Every day | ORAL | Status: DC
  Administered 2022-02-26 – 2022-03-04 (×7): 40 mg via ORAL
  Filled 2022-02-26 (×7): qty 1

## 2022-02-26 MED ORDER — SPIRONOLACTONE 25 MG PO TABS
25.0000 mg | ORAL_TABLET | ORAL | Status: DC
  Administered 2022-02-27 – 2022-03-03 (×3): 25 mg via ORAL
  Filled 2022-02-26 (×4): qty 1

## 2022-02-26 MED ORDER — TORSEMIDE 20 MG PO TABS
20.0000 mg | ORAL_TABLET | Freq: Every day | ORAL | Status: DC
Start: 2022-02-27 — End: 2022-03-04
  Administered 2022-02-27 – 2022-03-04 (×6): 20 mg via ORAL
  Filled 2022-02-26 (×6): qty 1

## 2022-02-26 MED ORDER — ACETAMINOPHEN 325 MG PO TABS: 650.0000 mg | ORAL_TABLET | ORAL | Status: DC | PRN

## 2022-02-26 MED ORDER — DOXAZOSIN MESYLATE 8 MG PO TABS
8.0000 mg | ORAL_TABLET | Freq: Every day | ORAL | Status: DC
Start: 2022-02-26 — End: 2022-03-04
  Administered 2022-02-26 – 2022-03-03 (×6): 8 mg via ORAL
  Filled 2022-02-26 (×7): qty 1

## 2022-02-26 MED ORDER — ABIRATERONE ACETATE 250 MG PO TABS
1000.0000 mg | ORAL_TABLET | Freq: Every day | ORAL | Status: DC
  Administered 2022-02-28 – 2022-03-04 (×5): 1000 mg via ORAL
  Filled 2022-02-26 (×7): qty 4

## 2022-02-26 MED ORDER — ONDANSETRON HCL 4 MG/2ML IJ SOLN: 4.0000 mg | Freq: Four times a day (QID) | INTRAMUSCULAR | Status: DC | PRN

## 2022-02-26 MED ORDER — PREDNISONE 5 MG PO TABS
5.0000 mg | ORAL_TABLET | Freq: Every day | ORAL | Status: DC
  Administered 2022-02-27 – 2022-03-04 (×6): 5 mg via ORAL
  Filled 2022-02-26 (×6): qty 1

## 2022-02-26 MED ORDER — LABETALOL HCL 200 MG PO TABS: 400.0000 mg | ORAL_TABLET | ORAL | Status: DC

## 2022-02-26 MED ORDER — FERROUS SULFATE 325 (65 FE) MG PO TABS
325.0000 mg | ORAL_TABLET | Freq: Every day | ORAL | Status: DC
Start: 2022-02-27 — End: 2022-03-04
  Administered 2022-02-27 – 2022-03-04 (×6): 325 mg via ORAL
  Filled 2022-02-26 (×6): qty 1

## 2022-02-26 MED ORDER — SODIUM CHLORIDE 0.9 % IV SOLN: INTRAVENOUS | Status: DC

## 2022-02-26 MED ORDER — ASPIRIN 81 MG PO TBEC
81.0000 mg | DELAYED_RELEASE_TABLET | Freq: Every day | ORAL | Status: DC
  Administered 2022-02-27 – 2022-03-04 (×6): 81 mg via ORAL
  Filled 2022-02-26 (×6): qty 1

## 2022-02-26 MED ORDER — LEVOTHYROXINE SODIUM 25 MCG PO TABS
125.0000 ug | ORAL_TABLET | Freq: Every day | ORAL | Status: DC
  Administered 2022-02-27 – 2022-03-04 (×6): 125 ug via ORAL
  Filled 2022-02-26 (×6): qty 1

## 2022-02-26 MED ORDER — POTASSIUM CHLORIDE CRYS ER 20 MEQ PO TBCR
20.0000 meq | EXTENDED_RELEASE_TABLET | Freq: Every day | ORAL | Status: DC
  Administered 2022-02-27 – 2022-03-04 (×7): 20 meq via ORAL
  Filled 2022-02-26 (×6): qty 1

## 2022-02-26 MED ORDER — LABETALOL HCL 200 MG PO TABS
400.0000 mg | ORAL_TABLET | Freq: Every day | ORAL | Status: DC
  Administered 2022-02-26 – 2022-03-03 (×6): 400 mg via ORAL
  Filled 2022-02-26 (×6): qty 2

## 2022-02-26 MED ORDER — INSULIN ASPART 100 UNIT/ML IJ SOLN
0.0000 [IU] | Freq: Three times a day (TID) | INTRAMUSCULAR | Status: DC
  Administered 2022-02-27 – 2022-02-28 (×4): 2 [IU] via SUBCUTANEOUS
  Administered 2022-02-28 – 2022-03-01 (×2): 3 [IU] via SUBCUTANEOUS
  Administered 2022-03-02 (×2): 2 [IU] via SUBCUTANEOUS
  Administered 2022-03-02: 5 [IU] via SUBCUTANEOUS
  Administered 2022-03-03 (×2): 2 [IU] via SUBCUTANEOUS
  Administered 2022-03-04: 5 [IU] via SUBCUTANEOUS

## 2022-02-26 NOTE — H&P (Addendum)
Cardiology Admission History and Physical:   Patient ID: Matthew Serrano. MRN: [id number]; DOB: [birth year]   Admission date: 02/26/2022  PCP:  [name], NP   [facility] Providers Cardiologist:  [name], MD  Cardiology APP:  Liliane Shi, PA-C    Chief Complaint:  endocarditis   Patient Profile:   Matthew Serrano. is a 76 y.o. 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  History of Present Illness:   Matthew Serrano is a 76 year old male with the above history who is followed by Dr. Burt Knack. Patient was seen by Matthew Dopp, PA-C, on 01/26/2022 and was reported some chest discomfort. His symptoms sounded non-cardiac but he did have some changes on his EKG. Nuclear stress test was ordered and showed no evidence of ischemia. Repeat Echo was ordered as well due to a prominent murmur and showed LVEF of 60-65% with no regional wall motion abnormalities, moderate LVH, and atypical location of the aortic valve and LVOT as well as evidence of turbulent flow and trivial perivalvular leak with mild central vale aortic regurgitation. She was by Matthew Dopp, PA-C, again on 02/19/2022 for follow-up at which time he denied any exertional chest discomfort but noted some discomfort with swallowing as well as dysphagia. He has had to vomit in in the past to get food out. He has no dysphagia with liquids. He also reported a lot of gas and indigestion. His Echo  was discussed with Dr. Margaretann Loveless who reviewed the study extensively and recommend coronary CTA for valve assessment with TAVR protocol as soon as possible. 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 Therefore, he was advised to come to [facility] for direct admission for further management of this.  I saw patient after his arrival to the floor. He is resting comfortably in no acute distress.  He denies any significant change since he last saw Matthew Serrano a week ago.  He denies any real chest pain.  He reports occasional chest discomfort when swallowing but this does not occur all the time.  He has some dyspnea on exertion with activities such as going up the steps which is new for him  No shortness of breath at rest.  No orthopnea or PND.  He has chronic lower extremity edema (worse on the left) which he states is a little worse yesterday but overall stable.  He denies any palpitations.  He has some lightheadedness/dizziness with quick position changes but no syncope.  He denies any recent fevers, cough, nasal congestion.  He has intermittent vomiting which he thinks may is due to timing of his Metformin but none recently. He had one episode of "light blood" noted on his toilet paper  after a bowel movement a couple weeks ago.  He states he was more constipated at the time. No other abnormal bleeding in urine or stools.  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   23 mm human aortic root graft with 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 and reimplantation of left main and right coronary arteries   Severe aortic insufficiency 05/13/2020   acute onset in setting of bacterial endocarditis   Severe aortic stenosis     Past Surgical History:  Procedure Laterality Date   AORTIC VALVE REPLACEMENT N/A 05/15/2020   Procedure: AORTIC VALVE REPLACEMENT (AVR) SIZE 23 MM,  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;  Surgeon: Rexene Alberts, MD;  Location: [place];  Service: Open Heart Surgery;  Laterality: N/A;   ASCENDING AORTIC ROOT REPLACEMENT N/A 05/15/2020   Procedure: HUMAN ALLOGRAFT AORTIC ROOT REPLACEMENT;  Surgeon: Rexene Alberts, MD;  Location: [place];  Service: Open Heart Surgery;  Laterality: N/A;   COLONOSCOPY     COLONOSCOPY N/A 05/03/2020   Procedure: COLONOSCOPY;  Surgeon: Juanita Craver, MD;  Location: [facility];  Service: Endoscopy;  Laterality: N/A;   COLONOSCOPY W/ BIOPSIES AND POLYPECTOMY     ENTEROSCOPY N/A 05/02/2020   Procedure: ENTEROSCOPY;  Surgeon: Rush Landmark Telford Nab., MD;  Location: [facility];  Service: Gastroenterology;  Laterality: N/A;   HEMOSTASIS CLIP PLACEMENT  05/03/2020   Procedure: HEMOSTASIS CLIP PLACEMENT;  Surgeon: Juanita Craver, MD;  Location: [facility];  Service: Endoscopy;;   HOT HEMOSTASIS N/A 05/03/2020   Procedure: HOT HEMOSTASIS (ARGON PLASMA COAGULATION/BICAP);  Surgeon: Juanita Craver, MD;  Location: [facility];  Service: Endoscopy;  Laterality: N/A;   IR IMAGING GUIDED PORT INSERTION  07/05/2018    POLYPECTOMY     PORT-A-CATH REMOVAL Right 05/20/2020   Procedure: REMOVAL PORT-A-CATH;  Surgeon: Rexene Alberts, MD;  Location: [facility];  Service: Thoracic;  Laterality: Right;   RIGHT/LEFT HEART CATH AND CORONARY ANGIOGRAPHY N/A 02/13/2020   Procedure: RIGHT/LEFT HEART CATH AND CORONARY ANGIOGRAPHY;  Surgeon: Belva Crome, MD;  Location: [place] [facility];  Service: Cardiovascular;  Laterality: N/A;   SUBMUCOSAL INJECTION  05/02/2020   Procedure: SUBMUCOSAL INJECTION;  Surgeon: Rush Landmark Telford Nab., MD;  Location: [facility];  Service: Gastroenterology;;   TEE WITHOUT CARDIOVERSION N/A 05/14/2020   Procedure: TRANSESOPHAGEAL ECHOCARDIOGRAM (TEE);  Surgeon: Sanda Klein, MD;  Location: [place];  Service: Cardiovascular;  Laterality: N/A;   TEE WITHOUT CARDIOVERSION N/A 05/15/2020   Procedure: TRANSESOPHAGEAL ECHOCARDIOGRAM (TEE);  Surgeon: Rexene Alberts, MD;  Location: [place];  Service: Open Heart Surgery;  Laterality: N/A;     Medications Prior to Admission: Prior to Admission medications   Medication Sig Start Date End Date Taking? Authorizing Provider  abiraterone acetate (ZYTIGA) 250 MG tablet TAKE 4 TABLETS (1,000 MG TOTAL) BY MOUTH DAILY. TAKE  ON AN EMPTY STOMACH 1 HOUR BEFORE OR 2 HOURS AFTER A MEAL 02/03/22 02/03/23  Wyatt Portela, MD  aspirin EC 81 MG tablet Take 81 mg by mouth daily. Swallow whole.    [provider]  atorvastatin (LIPITOR) 80 MG tablet Take 1 tablet (80 mg total) by mouth daily. 05/15/21 02/19/22  [name], [name], NP  Calcium Carbonate (CALCIUM 600 PO) Take 2 tablets by mouth daily.    [provider]  cholecalciferol (VITAMIN D3) 25 MCG (1000 UT) tablet Take 1,000 Units by mouth daily.    [provider]  doxazosin (CARDURA) 8 MG tablet TAKE 1 TABLET AT BEDTIME 11/03/21   [name], [name], NP  ferrous sulfate 325 (65 FE) MG EC tablet Take 1 tablet (325 mg total) by mouth 2 (two) times daily before a meal. 04/24/20   Willia Craze,  NP  glipiZIDE (GLUCOTROL XL) 10 MG 24 hr tablet TAKE 1 TABLET TWICE DAILY 01/19/22   [name], [name], NP  labetalol (NORMODYNE) 200 MG tablet TAKE 3 TABLETS EVERY MORNING AND TAKE 2 TABLETS EVERY EVENING 11/03/21   [name], [name], NP  levothyroxine (SYNTHROID) 125 MCG tablet Take 1 tablet (125 mcg total) by mouth daily. 09/30/21   [name], [name], NP  metFORMIN (GLUCOPHAGE) 1000 MG tablet TAKE 1 TABLET TWICE DAILY WITH MEALS (NEED MD APPOINTMENT FOR REFILLS) 11/03/21   [name], [name], NP  metoprolol tartrate (LOPRESSOR) 50 MG tablet TAKE 1 TABLET BY MOUTH 2 HOURS PRIOR TO THE CARDIAC CT 02/19/22   Matthew Serrano T, PA-C  omeprazole (PRILOSEC) 40 MG capsule Take 1 tablet by mouth twice a day X's 2 weeks then reduce back to 1 tablet by mouth daily 01/26/22   Matthew Serrano T, PA-C  potassium chloride SA (KLOR-CON M) 20 MEQ tablet Take 1 tablet (20 mEq total) by mouth daily. 02/01/22   Matthew Serrano T, PA-C  predniSONE (DELTASONE) 5 MG tablet TAKE 1 TABLET BY MOUTH ONCE A DAY WITH BREAKFAST 01/08/22 01/08/23  Wyatt Portela, MD  spironolactone (ALDACTONE) 25 MG tablet Take 0.5 tablets (12.5 mg total) by mouth daily. 02/11/22   Matthew Serrano T, PA-C  torsemide (DEMADEX) 20 MG tablet Take 1 tablet (20 mg total) by mouth daily. 02/18/22   [name], MD     Allergies:   No Known Allergies  Social History:   Social History   Socioeconomic History   Marital status: Widowed    Spouse name: Not on file   Number of children: Not on file   Years of education: Not on file   Highest education level: Not on file  Occupational History   Not on file  Tobacco Use   Smoking status: Former   Smokeless tobacco: Never   Tobacco comments:    quit 2005  Vaping Use   Vaping Use: Never used  Substance and Sexual Activity   Alcohol use: Not Currently    Alcohol/week: 14.0 standard drinks of alcohol    Types: 14 Cans of beer per week   Drug use: No   Sexual activity: Not on file  Other Topics Concern   Not on file   Social History Narrative   He works as a Freight forwarder    Not married    No kids          Social Determinants of Health   Financial Resource Strain: Low Risk  (11/02/2021)   Overall Financial Resource Strain (CARDIA)    Difficulty of Paying Living Expenses: Not hard at all  Food Insecurity: No Food Insecurity (11/02/2021)   Hunger Vital Sign    Worried About Running Out of Food in the Last Year: Never true    Ran Out of Food in the Last Year: Never true  Transportation Needs: No Transportation Needs (11/02/2021)   PRAPARE - Hydrologist (Medical): No    Lack of Transportation (Non-Medical): No  Physical Activity: Sufficiently Active (11/02/2021)   Exercise Vital Sign    Days of Exercise per Week: 5 days    Minutes of Exercise per Session: 30 min  Stress: No Stress Concern Present (11/02/2021)   [place]    Feeling of Stress : Not at all  Social Connections: Socially Isolated (11/12/2020)   Social Connection and Isolation Panel [NHANES]    Frequency of Communication with Friends and Family: More than three times a week    Frequency of Social Gatherings with Friends and Family: More than three times a week    Attends Religious Services: Never    Marine scientist or Organizations: No    Attends Archivist Meetings: Never    Marital Status: Widowed  Intimate Partner Violence: Not At Risk (11/12/2020)   Humiliation, Afraid, Rape, and Kick questionnaire    Fear of Current or Ex-Partner: No    Emotionally Abused: No    Physically Abused: No    Sexually Abused: No    Family History:   The patient's family history includes Aneurysm in his mother; Breast cancer in his brother, sister, and another family member; Hypertension in an other family member; Prostate cancer in his father. There is no history of Colon cancer, Esophageal cancer, Stomach cancer, or Rectal cancer.     ROS:  Please see the history of present illness.  Review of Systems  Constitutional:  Negative for fever.  HENT:  Negative for congestion.        Dysphagia  Respiratory:  Positive for shortness of breath. Negative for cough.   Gastrointestinal:  Positive for blood in stool (one episode of blood on toilet paper while constipated) and vomiting (occasional). Negative for melena.  Genitourinary:  Negative for hematuria.  Musculoskeletal:  Negative for myalgias.  Neurological:  Positive for dizziness. Negative for loss of consciousness.  Endo/Heme/Allergies:  Does not bruise/bleed easily.  Psychiatric/Behavioral:  Substance abuse: remote smoking history.    Physical Exam/Data:   Vitals:   02/26/22 1555  BP: (!) 151/81  Pulse: 63  Resp: 14  Temp: 99.1 F (37.3 C)  TempSrc: Oral  SpO2: 98%   No intake or output data in the 24 hours ending 02/26/22 1650    02/19/2022    8:17 AM 02/10/2022    7:30 AM 01/26/2022    7:51 AM  Last 3 Weights  Weight (lbs) 200 lb 3.2 oz 201 lb 201 lb 12.8 oz  Weight (kg) 90.81 kg 91.173 kg 91.536 kg     There is no height or weight on file to calculate BMI.  General: 76 y.o. male resting comfortably in no acute distress. HEENT: Normocephalic and atraumatic. Sclera clear.  Heart: Irregular rhythm with normal rate. III/VI holosystolic murmur.  Lungs: No increased work of breathing. Clear to ausculation bilaterally. No wheezes, rhonchi, or rales.  Abdomen: Soft, non-distended, and non-tender to palpation. Bowel sounds present. Extremities: bilateral lower extremity edema (1-2+ pitting edema of the left and trace edema on the right).   Skin: Warm and dry. Neuro: Alert and oriented  x3. No focal deficits. Psych: Normal affect. Responds appropriately.  EKG:  Most recent EKG from 01/26/2022 personally reviewed and showed normal sinus rhythm, rate 62 bpm, with LVH and T wave inversions in anterior lateral leads.  Telemetry: Telemetry personally reviewed and  showed normal sinus rhythm with PACs and rates in the 60s.  Relevant CV Studies:  Echocardiogram 02/10/2022: Impressions: 1. Left ventricular ejection fraction, by estimation, is 60 to 65%. The  left ventricle has normal function. The left ventricle has no regional  wall motion abnormalities. There is moderate left ventricular hypertrophy  of the basal-septal segment. Left  ventricular diastolic parameters are indeterminate.   2. Right ventricular systolic function is normal. The right ventricular  size is normal. There is mildly elevated pulmonary artery systolic  pressure.   3. Left atrial size was severely dilated.   4. Right atrial size was mildly dilated.   5. The mitral valve is normal in structure. Mild mitral valve  regurgitation. No evidence of mitral stenosis.   6. 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 This likely accounts for atypical  location of aortc valve in LVOT. Prior echoes reviewed, valve appears  unchanged from prior. There is evidence of  turbulent flow and trivial perivalvular leak (location of native right  coronary cusp), adjacent to PA and likely prior site of aorta-PA fistula.  There is also mild central valve aortic regurgitation. The aortic valve  has been repaired/replaced. Aortic  valve regurgitation is mild. There is a 23 mm human allograft valve  present in the aortic position. Procedure Date: 05/15/20. Echo findings  are consistent with perivalvular leak of the aortic prosthesis.   7. Aortic root has repair at site of prior abscess/fistula. See comments  re: aortic valve replacement. Aortic root/ascending aorta has been  repaired/replaced.   8. The inferior vena cava is normal in size with greater than 50%  respiratory variability, suggesting right atrial pressure of 3 mmHg.   Comparison(s): Prior images reviewed side by side. See comments re: aortic  valve; atypical placement given  history of abscess/fistula in aortic root.  _______________  Myoview 02/10/2022:   The study is normal. The study is low risk.   No ST deviation was noted.   LV perfusion is normal. There is no evidence of ischemia. There is no evidence of infarction.   Left ventricular function is normal. Nuclear stress EF: 69 %. The left ventricular ejection fraction is hyperdynamic (>65%). End diastolic cavity size is mildly enlarged. End systolic cavity size is normal.   Prior study available for comparison from 03/25/2017. _______________  Coronary CTA 02/25/2022: Impression: 1. Coronary calcium score of 1915. This was 97th percentile for age-, sex, and race-matched controls. Plaque volume 1254 mm3. 2. Normal coronary origin with right dominance. 3. Mild calcified plaque (25-49%) in the left main, LAD, and LCX. 4. Moderate calcified plaque in the mid RCA (50-69%). 5. 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 Thickening at the base of the RCC/LCC leaflets which may represent vegetation. 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 6. Dilated pulmonary artery suggestive of pulmonary hypertension.   Recommendations: 1. Moderate stenosis in the mid RCA (50-69%). Consider symptom-guided anti-ischemic pharmacotherapy as well as risk factor modification per guideline directed care. Additional analysis with CT FFR will be submitted.  FFR Summary: CT FFR negative.  Laboratory Data:  High Sensitivity Troponin:  No results for input(s): "TROPONINIHS" in the last 720 hours.    ChemistryNo results for input(s): "NA", "K", "CL", "CO2", "GLUCOSE", "BUN", "CREATININE", "CALCIUM", "MG", "GFRNONAA", "GFRAA", "ANIONGAP" in the last 168 hours.  No results for input(s): "PROT", "ALBUMIN", "AST", "ALT", "ALKPHOS", "BILITOT" in the last 168 hours. Lipids No results for input(s): "CHOL", "TRIG", "HDL",  "LABVLDL", "LDLCALC", "CHOLHDL" in the last 168 hours. HematologyNo results for input(s): "WBC", "RBC", "HGB", "HCT", "MCV", "MCH", "MCHC", "RDW", "PLT" in the last 168 hours. Thyroid No results for input(s): "TSH", "FREET4" in the last 168 hours. BNPNo results for input(s): "BNP", "PROBNP" in the last 168 hours.  DDimer No results for input(s): "DDIMER" in the last 168 hours.   Radiology/Studies:  CT CORONARY FRACTIONAL FLOW RESERVE DATA PREP  Result Date: 02/25/2022 EXAM: CT FFR analysis was performed on the original cardiac CTA dataset. Diagrammatic representation of the CT FFR analysis is provided in a separate PDF document in PACS. This dictation was created using the PDF document and an interactive 3D model of the results. The 3D model is not available in the EMR/PACS. INTERPRETATION: CT FFR provides simultaneous calculation of pressure and flow across the entire coronary tree. For clinical decision making, CT FFR values should be obtained 1-2 cm distal to the lower border of each stenosis measured. Coronary CTA-related artifacts may impair the diagnostic accuracy of the original cardiac CTA and FFR CT results. *Due to the fact that CT FFR represents a mathematically-derived analysis, it is recommended that the results be interpreted as follows: 1. CT FFR >0.80: Low likelihood of hemodynamic significance. 2. CT FFR 0.76-0.80: Borderline likelihood of hemodynamic significance. 3. CT FFR =< 0.75: High likelihood of hemodynamic significance. *Coronary CT Angiography-derived Fractional Flow Reserve Testing in Patients with Stable Coronary Artery Disease: Recommendations on Interpretation and Reporting. Radiology: Cardiothoracic Imaging. 2019;1(5):e190050 FINDINGS: 1. Left Main: 0.99; low likelihood of hemodynamic significance. 2. Prox LAD: 0.98; low likelihood of hemodynamic significance. 3. Mid LAD: 0.95; low likelihood of hemodynamic significance. 4. Distal LAD: 0.83; low likelihood of hemodynamic  significance. 5. D2: 0.84; low likelihood of hemodynamic significance. 6. LCX: 0.99; low likelihood of hemodynamic significance. 7. Prox RCA: 0.96; low likelihood of hemodynamic significance. 8. Mid RCA: 0.94; low likelihood of hemodynamic significance. 9. Distal RCA: 0.94; low likelihood of hemodynamic significance. IMPRESSION: 1.  CT FFR negative. Eleonore Chiquito, MD Electronically Signed   By: Eleonore Chiquito M.D.   On: 02/25/2022 21:57     Assessment and Plan:   Endocarditis of Aortic Valve Patient has a history of severe aortic stenosis noted on Echo in 01/2020. There were plan for AVR at that time but then he developed SBE with severe aortic insufficiency. He ultimately underwent AVR with aortic root replacement and repair of the aorta to pulmonary artery fistula in 04/2020 with Dr. Roxy Manns. Recent Echo in 01/2022 showed atypical location of the aortic valve and LVOT as well as evidence of turbulent flow and trivial perivalvular leak with mild central vale aortic regurgitation. 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 Findings were consistent with endocarditis of the prosthesis. Therefore, patient was directed admitted for further management of this. - Will check routine labs and blood cultures. - CT Surgery and ID have both been consulted. Appreciate there assistance. - Will  ultimately need a TEE. Will try to get this done on Monday. Will make NPO at midnight and place orders. Of note, patient does report intermittent dysphagia.  Shared Decision Making/Informed Consent{ The risks [esophageal damage, perforation (1:10,000 risk), bleeding, pharyngeal hematoma as well as other potential complications associated with conscious sedation including  aspiration, arrhythmia, respiratory failure and death], benefits (treatment guidance and diagnostic support) and alternatives of a transesophageal echocardiogram were discussed in detail with Matthew Serrano and he is willing to proceed.   Non-Obstructive CAD Coronary CTA on 02/25/2022 showed coronary calcium score of 1,915 (97th percentile for age and sex) and moderate CAD with negative FFR. - Continue aspirin and high-intensity statin.  HFpEF Mild Pulmonary Hypertension Recent Echo in 01/2022 showed LVEF of 60-65% with normal wall motion and moderate LVH of the basal-septal segment. RV normal with mildly elevated PASP. - Euvolemic on exam. - Continue Torsemide '20mg'$  daily and Spironolactone '25mg'$  every other day. - Will monitor daily weights, strict I/Os, and renal function.  Hypertension BP mildly elevated upon arrival to the floor but looks like it is usually well controlled. - Continue home medications: Labetolol '600mg'$  in the morning and '400mg'$  in the evening and Spironolactone '25mg'$  every other day, and Cardura '8mg'$  daily. Also on Cardura '8mg'$  daily at bedtime.  Hyperlipidemia - Continue Lipitor '80mg'$  daily.  Type 2 Diabetes Mellitus On Metformin and Glipizide at home. - Will hold oral medications and place on sliding scale insulin.  Hypothyroidism - Continue home Synthroid 187mg daily.  Metastatic Prostate Cancer - On suppression with Zytiga 1,'000mg'$  daily and Prednisone '5mg'$  daily at home. Will continue here.  Code Status: Discussed code status with patient. He initially said he wanted to be DNR. However, after discussion with patient he said he would want full scope of care if we thick he would recover without any significant deficits. He emphasized that he just does not want to be stuck on a machine. Explained that he would be a full code then which he agreed with.  Risk Assessment/Risk Scores:    New York Heart Association (NYHA) Functional Class NYHA Class II  Severity of  Illness: The appropriate patient status for this patient is INPATIENT. Inpatient status is judged to be reasonable and necessary in order to provide the required intensity of service to ensure the patient's safety. The patient's presenting symptoms, physical exam findings, and initial radiographic and laboratory data in the context of their chronic comorbidities is felt to place them at high risk for further clinical deterioration. Furthermore, it is not anticipated that the patient will be medically stable for discharge from the hospital within 2 midnights of admission.   * I certify that at the point of admission it is my clinical judgment that the patient will require inpatient hospital care spanning beyond 2 midnights from the point of admission due to high intensity of service, high risk for further deterioration and high frequency of surveillance required.*   For questions or updates, please contact CMcGrewPlease consult [email or web address] for contact info under     Signed, CDarreld Mclean PA-C  02/26/2022 4:50 PM   Patient seen, examined. Available data reviewed. Agree with findings, assessment, and plan as outlined by CSande Rives PA-C.  The patient is independently interviewed and examined.  He is alert, oriented, in no distress.  HEENT is normal, lungs are clear bilaterally, heart is regular rate and rhythm with a loud 3/6 holosystolic murmur heard across the precordium loudest at the right and left upper  sternal borders, abdomen is soft and nontender, extremities have 1+ ankle edema bilaterally, skin is warm and dry with no rash.  The patient is essentially in his normal state of health.  He still drives, runs errands, and does yard work.  He has some lower extremity edema and mild exertional dyspnea, but no other acute complaints.  He has had no fevers, chills, or skin rash.  The patient is edentulous and has full dentures.  All of his imaging studies have been reviewed.  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  Findings are suggestive of endocarditis.  Extensive discussion with the patient this evening.  Clinically, he does not have symptoms of any acute or subacute infection.  He has had no fevers, chills, or constitutional symptoms.  His labs are essentially unremarkable.  We have drawn blood cultures.  I will add a sedimentation rate to his labs.  I have consulted infectious disease and cardiac surgery.  The patient will have a transesophageal echocardiogram early next week.  He may ultimately require complex redo cardiac surgery either here or at a tertiary center.  Will await recommendations of Dr. Roxan Hockey and appreciate his consultation in advance.  [name], M.D. 02/26/2022 7:54 PM

## 2022-02-26 NOTE — Plan of Care (Signed)

## 2022-02-27 DIAGNOSIS — I330 Acute and subacute infective endocarditis: Secondary | ICD-10-CM | POA: Diagnosis not present

## 2022-02-27 DIAGNOSIS — T8209XA Other mechanical complication of heart valve prosthesis, initial encounter: Secondary | ICD-10-CM

## 2022-02-27 DIAGNOSIS — T826XXA Infection and inflammatory reaction due to cardiac valve prosthesis, initial encounter: Secondary | ICD-10-CM | POA: Diagnosis not present

## 2022-02-27 DIAGNOSIS — I38 Endocarditis, valve unspecified: Secondary | ICD-10-CM | POA: Diagnosis not present

## 2022-02-27 LAB — CK: Total CK: 125 U/L (ref 49–397)

## 2022-02-27 MED ORDER — SODIUM CHLORIDE 0.9 % IV SOLN
8.0000 mg/kg | Freq: Every day | INTRAVENOUS | Status: DC
  Administered 2022-02-27 – 2022-03-03 (×5): 700 mg via INTRAVENOUS
  Filled 2022-02-27 (×6): qty 14

## 2022-02-27 MED ORDER — SODIUM CHLORIDE 0.9 % IV SOLN
2.0000 g | INTRAVENOUS | Status: DC
  Administered 2022-02-27 – 2022-03-04 (×5): 2 g via INTRAVENOUS
  Filled 2022-02-27 (×7): qty 20

## 2022-02-27 NOTE — Consult Note (Signed)
Reason for Consult:Possible aortic root abscess/ Aorta- PA fistula Referring Physician: Dr. Heron Nay. is an 76 y.o. male.  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 Roxy Manns 2021, prostate cancer, type 2 diabetes, hypertension, hyperlipidemia, hypothyroidism, lymphedema, and colon AVM with lower GI bleed.  He was seen in the office in June and reported some chest discomfort.  EKG showed some changes.  Nuclear stress test showed no evidence of ischemia.  Echocardiogram showed normal left-ventricular systolic function there was turbulent flow noted around the homograft root.  He and was seen at the end of June and complained of some dysphagia.  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  He was admitted for further work-up and treatment.  He was started empirically on ceftriaxone and daptomycin for possible endocarditis.  He denies any chest pain currently.  Still having some dysphagia.  No fever or chills but positive night sweats.  Chronic leg swelling left greater than right about at his baseline shortness of breath with activities not at rest.  Also complains of some lightheadedness and dizziness.  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   23 mm human aortic root graft with 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 and reimplantation of left main and right coronary arteries   Severe aortic insufficiency 05/13/2020   acute onset in setting of bacterial endocarditis   Severe aortic stenosis     Past Surgical History:  Procedure Laterality Date   AORTIC VALVE REPLACEMENT N/A 05/15/2020   Procedure: AORTIC VALVE REPLACEMENT (AVR) SIZE 23 MM,  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;  Surgeon: Rexene Alberts, MD;  Location: [facility];  Service: Open Heart Surgery;  Laterality: N/A;   ASCENDING AORTIC ROOT REPLACEMENT N/A 05/15/2020   Procedure: HUMAN ALLOGRAFT AORTIC ROOT REPLACEMENT;  Surgeon: Rexene Alberts, MD;  Location: [facility];  Service: Open Heart Surgery;  Laterality: N/A;   COLONOSCOPY     COLONOSCOPY N/A 05/03/2020   Procedure: COLONOSCOPY;  Surgeon: Juanita Craver, MD;  Location: [facility];  Service: Endoscopy;  Laterality: N/A;   COLONOSCOPY W/ BIOPSIES AND POLYPECTOMY     ENTEROSCOPY N/A 05/02/2020   Procedure: ENTEROSCOPY;  Surgeon: Rush Landmark Telford Nab., MD;  Location: [facility];  Service: Gastroenterology;  Laterality: N/A;   HEMOSTASIS CLIP PLACEMENT  05/03/2020   Procedure: HEMOSTASIS CLIP PLACEMENT;  Surgeon: Juanita Craver, MD;  Location: [facility];  Service: Endoscopy;;   HOT HEMOSTASIS N/A 05/03/2020   Procedure: HOT HEMOSTASIS (ARGON PLASMA COAGULATION/BICAP);  Surgeon: Juanita Craver, MD;  Location: [facility];  Service: Endoscopy;  Laterality: N/A;   IR IMAGING GUIDED PORT  INSERTION  07/05/2018   POLYPECTOMY     PORT-A-CATH REMOVAL Right 05/20/2020   Procedure: REMOVAL PORT-A-CATH;  Surgeon: Rexene Alberts, MD;  Location: [facility];  Service: Thoracic;  Laterality: Right;   RIGHT/LEFT HEART CATH AND CORONARY ANGIOGRAPHY N/A 02/13/2020   Procedure: RIGHT/LEFT HEART CATH AND CORONARY ANGIOGRAPHY;  Surgeon: Belva Crome, MD;  Location: [facility];  Service: Cardiovascular;  Laterality: N/A;   SUBMUCOSAL INJECTION  05/02/2020   Procedure: SUBMUCOSAL INJECTION;  Surgeon: Rush Landmark Telford Nab., MD;  Location: [facility];  Service: Gastroenterology;;   TEE WITHOUT CARDIOVERSION N/A 05/14/2020   Procedure: TRANSESOPHAGEAL ECHOCARDIOGRAM (TEE);  Surgeon: Sanda Klein, MD;  Location: [facility];  Service: Cardiovascular;  Laterality: N/A;   TEE WITHOUT CARDIOVERSION N/A 05/15/2020   Procedure: TRANSESOPHAGEAL ECHOCARDIOGRAM (TEE);  Surgeon: Rexene Alberts, MD;  Location: [facility];  Service: Open Heart Surgery;  Laterality: N/A;    Family History  Problem Relation Age of Onset   Hypertension Other    Breast cancer Sister    Breast cancer Other        family history   Breast cancer Brother        prostate ca   Aneurysm Mother        Brain aneurysm    Prostate cancer Father    Colon cancer Neg Hx    Esophageal cancer Neg Hx    Stomach cancer Neg Hx    Rectal cancer Neg Hx     Social History:  reports that he has quit smoking. He has never used smokeless tobacco. He reports that he does not currently use alcohol after a past usage of about 14.0 standard drinks of alcohol per week. He reports that he does not use drugs.  Allergies: No Known Allergies  Medications: Scheduled:  abiraterone acetate  1,000 mg Oral QAC breakfast   aspirin EC  81 mg Oral Daily   atorvastatin  80 mg Oral Daily   doxazosin  8 mg Oral QHS   enoxaparin (LOVENOX) injection  40 mg Subcutaneous Q24H   ferrous sulfate  325 mg Oral Daily   insulin aspart  0-15 Units Subcutaneous TID WC   labetalol  400 mg Oral QHS   labetalol  600 mg Oral Daily   levothyroxine  125 mcg Oral Daily   pantoprazole  40 mg Oral Daily   potassium chloride SA  20 mEq Oral Daily   predniSONE  5 mg Oral Q breakfast   spironolactone  25 mg Oral QODAY   torsemide  20 mg Oral Daily    Results for orders placed or performed during the hospital encounter of  02/26/22 (from the past 48 hour(s))  Glucose, capillary     Status: Abnormal   Collection Time: 02/26/22  4:01 PM  Result Value Ref Range   Glucose-Capillary 120 (H) 70 - 99 mg/dL    Comment: Glucose reference range applies only to samples taken after fasting for at least 8 hours.  Comprehensive metabolic panel     Status: Abnormal   Collection Time: 02/26/22  6:16 PM  Result Value Ref Range   Sodium 142 135 - 145 mmol/L   Potassium 3.8 3.5 - 5.1 mmol/L   Chloride 105 98 - 111 mmol/L   CO2 26 22 - 32 mmol/L   Glucose, Bld 207 (H) 70 - 99 mg/dL    Comment: Glucose reference range applies only to samples taken after fasting for at least 8 hours.   BUN 24 (H)  8 - 23 mg/dL   Creatinine, Ser 1.39 (H) 0.61 - 1.24 mg/dL   Calcium 8.8 (L) 8.9 - 10.3 mg/dL   Total Protein 5.9 (L) 6.5 - 8.1 g/dL   Albumin 3.7 3.5 - 5.0 g/dL   AST 26 15 - 41 U/L   ALT 21 0 - 44 U/L   Alkaline Phosphatase 46 38 - 126 U/L   Total Bilirubin 0.8 0.3 - 1.2 mg/dL   GFR, Estimated 53 (L) >60 mL/min    Comment: (NOTE) Calculated using the CKD-EPI Creatinine Equation (2021)    Anion gap 11 5 - 15    Comment: Performed at [facility] [address] [place], [place] [address]  TSH     Status: Abnormal   Collection Time: 02/26/22  6:16 PM  Result Value Ref Range   TSH 0.301 (L) 0.350 - 4.500 uIU/mL    Comment: Performed by a 3rd Generation assay with a functional sensitivity of <=0.01 uIU/mL. Performed at [facility], [place] [address] [place], [place] [address]   Hemoglobin A1c     Status: Abnormal   Collection Time: 02/26/22  6:16 PM  Result Value Ref Range   Hgb A1c MFr Bld 6.1 (H) 4.8 - 5.6 %    Comment: (NOTE) Pre diabetes:          5.7%-6.4%  Diabetes:              >6.4%  Glycemic control for   <7.0% adults with diabetes    Mean Plasma Glucose 128.37 mg/dL    Comment: Performed at [facility] [address], [place], [place] [address]  CBC with Differential/Platelet     Status:  Abnormal   Collection Time: 02/26/22  6:16 PM  Result Value Ref Range   WBC 5.3 4.0 - 10.5 K/uL   RBC 4.03 (L) 4.22 - 5.81 MIL/uL   Hemoglobin 11.2 (L) 13.0 - 17.0 g/dL   HCT 35.1 (L) 39.0 - 52.0 %   MCV 87.1 80.0 - 100.0 fL   MCH 27.8 26.0 - 34.0 pg   MCHC 31.9 30.0 - 36.0 g/dL   RDW 15.1 11.5 - 15.5 %   Platelets 149 (L) 150 - 400 K/uL   nRBC 0.0 0.0 - 0.2 %   Neutrophils Relative % 58 %   Neutro Abs 3.0 1.7 - 7.7 K/uL   Lymphocytes Relative 31 %   Lymphs Abs 1.6 0.7 - 4.0 K/uL   Monocytes Relative 9 %   Monocytes Absolute 0.5 0.1 - 1.0 K/uL   Eosinophils Relative 1 %   Eosinophils Absolute 0.1 0.0 - 0.5 K/uL   Basophils Relative 1 %   Basophils Absolute 0.0 0.0 - 0.1 K/uL   Immature Granulocytes 0 %   Abs Immature Granulocytes 0.01 0.00 - 0.07 K/uL    Comment: Performed at [facility], [address], [place], [place] [address]  Culture, blood (Routine X 2) w Reflex to ID Panel     Status: None (Preliminary result)   Collection Time: 02/26/22  6:16 PM   Specimen: BLOOD  Result Value Ref Range   Specimen Description BLOOD RIGHT ANTECUBITAL    Special Requests      BOTTLES DRAWN AEROBIC AND ANAEROBIC Blood Culture adequate volume   Culture      NO GROWTH < 24 HOURS Performed at [facility], [place] [address] [place], [place] [address]    Report Status PENDING   Sedimentation rate  Status: None   Collection Time: 02/26/22  6:16 PM  Result Value Ref Range   Sed Rate 3 0 - 16 mm/hr    Comment: Performed at [facility] [address], [place], [place] [address]  Culture, blood (Routine X 2) w Reflex to ID Panel     Status: None (Preliminary result)   Collection Time: 02/26/22  6:22 PM   Specimen: BLOOD  Result Value Ref Range   Specimen Description BLOOD BLOOD RIGHT FOREARM    Special Requests      BOTTLES DRAWN AEROBIC AND ANAEROBIC Blood Culture adequate volume   Culture      NO GROWTH < 24 HOURS Performed at [facility], [place]  [address] [place], [place] [address]    Report Status PENDING   CK     Status: None   Collection Time: 02/27/22 12:07 PM  Result Value Ref Range   Total CK 125 49 - 397 U/L    Comment: Performed at [facility], [place] [address], [place], [place] [address]    CT CORONARY FRACTIONAL FLOW RESERVE DATA PREP  Result Date: 02/25/2022 EXAM: CT FFR analysis was performed on the original cardiac CTA dataset. Diagrammatic representation of the CT FFR analysis is provided in a separate PDF document in PACS. This dictation was created using the PDF document and an interactive 3D model of the results. The 3D model is not available in the EMR/PACS. INTERPRETATION: CT FFR provides simultaneous calculation of pressure and flow across the entire coronary tree. For clinical decision making, CT FFR values should be obtained 1-2 cm distal to the lower border of each stenosis measured. Coronary CTA-related artifacts may impair the diagnostic accuracy of the original cardiac CTA and FFR CT results. *Due to the fact that CT FFR represents a mathematically-derived analysis, it is recommended that the results be interpreted as follows: 1. CT FFR >0.80: Low likelihood of hemodynamic significance. 2. CT FFR 0.76-0.80: Borderline likelihood of hemodynamic significance. 3. CT FFR =< 0.75: High likelihood of hemodynamic significance. *Coronary CT Angiography-derived Fractional Flow Reserve Testing in Patients with Stable Coronary Artery Disease: Recommendations on Interpretation and Reporting. Radiology: Cardiothoracic Imaging. 2019;1(5):e190050 FINDINGS: 1. Left Main: 0.99; low likelihood of hemodynamic significance. 2. Prox LAD: 0.98; low likelihood of hemodynamic significance. 3. Mid LAD: 0.95; low likelihood of hemodynamic significance. 4. Distal LAD: 0.83; low likelihood of hemodynamic significance. 5. D2: 0.84; low likelihood of hemodynamic significance. 6. LCX: 0.99; low likelihood of hemodynamic significance. 7. Prox RCA: 0.96;  low likelihood of hemodynamic significance. 8. Mid RCA: 0.94; low likelihood of hemodynamic significance. 9. Distal RCA: 0.94; low likelihood of hemodynamic significance. IMPRESSION: 1.  CT FFR negative. Matthew Chiquito, MD Electronically Signed   By: Matthew Serrano M.D.   On: 02/25/2022 21:57    Review of Systems  Constitutional:  Positive for diaphoresis and fatigue. Negative for fever and unexpected weight change.  HENT:  Positive for trouble swallowing.   Respiratory:  Positive for shortness of breath.   Cardiovascular:  Positive for chest pain (None currently) and leg swelling.  Neurological:  Positive for dizziness.  All other systems reviewed and are negative.  Blood pressure 136/68, pulse (!) 58, temperature 98.5 F (36.9 C), temperature source Oral, resp. rate 11, weight 86.9 kg, SpO2 96 %. Physical Exam Constitutional:      General: He is not in acute distress.    Appearance: Normal appearance.  HENT:     Head: Normocephalic and atraumatic.  Eyes:     General: No scleral icterus.    Extraocular Movements: Extraocular movements intact.  Cardiovascular:     Rate and Rhythm: Normal rate and regular rhythm.     Pulses: Normal pulses.     Heart sounds: Murmur (3/6 holosystolic murmur) heard.  Pulmonary:     Effort: Pulmonary effort is normal. No respiratory distress.     Breath sounds: Normal breath sounds. No wheezing.  Abdominal:     General: There is no distension.     Palpations: Abdomen is soft.  Musculoskeletal:     Right lower leg: Edema (1+) present.     Left lower leg: Edema (2+) present.  Skin:    General: Skin is warm and dry.  Neurological:     General: No focal deficit present.     Mental Status: He is alert and oriented to person, place, and time.     Cranial Nerves: No cranial nerve deficit.     Motor: No weakness.     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 Roxy Manns 2021, prostate cancer, type 2 diabetes, hypertension, hyperlipidemia, hypothyroidism, lymphedema, and colon AVM with lower GI bleed.    He presented with some chest discomfort back in early June.  He then later developed some dysphagia.  Work-up included an echocardiogram which showed turbulent flow around the left ventricular outflow tract/aortic root area.  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  It is unclear whether this is just due to breakdown of tissue development of the pseudoaneurysm or indolent infection.  Either way it is a serious and life-threatening problem without an easy solution.  He would be an extremely high risk patient.  It is unclear if there is even a way to reconstruct the LV outflow tract in the setting.  Will await the results of the transesophageal echocardiogram and then need to have multidisciplinary discussion as to the options.  Matthew Serrano 02/27/2022, 5:33 PM

## 2022-02-27 NOTE — Progress Notes (Signed)
Pharmacy Antibiotic Note  Matthew Serrano. is a 76 y.o. male admitted on 02/26/2022 with possible endocarditis .  Pharmacy has been consulted for daptomycin dosing for empiric treatment. Baseline CK and WBC  WNL, afebrile. Patient is also on ceftriaxone 2g IV q24 for possible endocarditis. TEE pending.  Plan: Initiate daptomycin 700 mg IV q24 (8 mg/kg q24) Monitor weekly CK  Weight: 86.9 kg (191 lb 9.3 oz)  Temp (24hrs), Avg:98.4 F (36.9 C), Min:98 F (36.7 C), Max:99.1 F (37.3 C)  Recent Labs  Lab 02/26/22 1816  WBC 5.3  CREATININE 1.39*    Estimated Creatinine Clearance: 50 mL/min (A) (by C-G formula based on SCr of 1.39 mg/dL (H)).    No Known Allergies  Antimicrobials this admission: Daptomycin 7/8 >>  CTX 7/8 >>  Dose adjustments this admission: N/A  Microbiology results: 7/7 BCx: NGTD x2  Thank you for allowing pharmacy to be a part of this patient's care.  Louanne Belton PGY1 Pharmacy Resident 02/27/2022 11:25 AM

## 2022-02-27 NOTE — Progress Notes (Signed)
Progress Note  Patient Name: Matthew Serrano. Date of Encounter: 02/27/2022  [facility] Cardiologist: [name], MD   Subjective   Feeling well without chest pain or shortness of breath.  Inpatient Medications    Scheduled Meds:  abiraterone acetate  1,000 mg Oral Daily   aspirin EC  81 mg Oral Daily   atorvastatin  80 mg Oral Daily   doxazosin  8 mg Oral QHS   enoxaparin (LOVENOX) injection  40 mg Subcutaneous Q24H   ferrous sulfate  325 mg Oral Daily   insulin aspart  0-15 Units Subcutaneous TID WC   labetalol  400 mg Oral QHS   labetalol  600 mg Oral Daily   levothyroxine  125 mcg Oral Daily   pantoprazole  40 mg Oral Daily   potassium chloride SA  20 mEq Oral Daily   predniSONE  5 mg Oral Q breakfast   spironolactone  25 mg Oral QODAY   torsemide  20 mg Oral Daily   Continuous Infusions:  sodium chloride 20 mL/hr at 02/26/22 1743   PRN Meds: acetaminophen, nitroGLYCERIN, ondansetron (ZOFRAN) IV   Vital Signs    Vitals:   02/27/22 0015 02/27/22 0436 02/27/22 0726 02/27/22 0920  BP: (!) 161/76 (!) 161/76 (!) 150/66 122/60  Pulse: 62 60 (!) 56 61  Resp: (!) 21 (!) 21 11   Temp: 98.4 F (36.9 C) 98.4 F (36.9 C) 98.1 F (36.7 C)   TempSrc: Oral Oral Oral   SpO2: 100% 100% 100%   Weight:  86.9 kg      Intake/Output Summary (Last 24 hours) at 02/27/2022 1036 Last data filed at 02/27/2022 0900 Gross per 24 hour  Intake 425.53 ml  Output 400 ml  Net 25.53 ml      02/27/2022    4:36 AM 02/19/2022    8:17 AM 02/10/2022    7:30 AM  Last 3 Weights  Weight (lbs) 191 lb 9.3 oz 200 lb 3.2 oz 201 lb  Weight (kg) 86.9 kg 90.81 kg 91.173 kg      Telemetry    Sinus rhythm- Personally Reviewed  ECG    None new- Personally Reviewed  Physical Exam   GEN: Well nourished, well developed, in no acute distress  HEENT: normal  Neck: no JVD, carotid bruits, or masses Cardiac: Regular rhythm, 3-6 holosystolic murmur at the apex, no rubs, or gallops, +  edema  Respiratory:  clear to auscultation bilaterally, normal work of breathing GI: soft, nontender, nondistended, + BS MS: no deformity or atrophy  Skin: warm and dry Neuro:  Strength and sensation are intact Psych: euthymic mood, full affect   Labs    High Sensitivity Troponin:  No results for input(s): "TROPONINIHS" in the last 720 hours.   Chemistry Recent Labs  Lab 02/26/22 1816  NA 142  K 3.8  CL 105  CO2 26  GLUCOSE 207*  BUN 24*  CREATININE 1.39*  CALCIUM 8.8*  PROT 5.9*  ALBUMIN 3.7  AST 26  ALT 21  ALKPHOS 46  BILITOT 0.8  GFRNONAA 53*  ANIONGAP 11    Lipids No results for input(s): "CHOL", "TRIG", "HDL", "LABVLDL", "LDLCALC", "CHOLHDL" in the last 168 hours.  Hematology Recent Labs  Lab 02/26/22 1816  WBC 5.3  RBC 4.03*  HGB 11.2*  HCT 35.1*  MCV 87.1  MCH 27.8  MCHC 31.9  RDW 15.1  PLT 149*   Thyroid  Recent Labs  Lab 02/26/22 1816  TSH 0.301*  BNPNo results for input(s): "BNP", "PROBNP" in the last 168 hours.  DDimer No results for input(s): "DDIMER" in the last 168 hours.   Radiology    CT CORONARY FRACTIONAL FLOW RESERVE DATA PREP  Result Date: 02/25/2022 EXAM: CT FFR analysis was performed on the original cardiac CTA dataset. Diagrammatic representation of the CT FFR analysis is provided in a separate PDF document in PACS. This dictation was created using the PDF document and an interactive 3D model of the results. The 3D model is not available in the EMR/PACS. INTERPRETATION: CT FFR provides simultaneous calculation of pressure and flow across the entire coronary tree. For clinical decision making, CT FFR values should be obtained 1-2 cm distal to the lower border of each stenosis measured. Coronary CTA-related artifacts may impair the diagnostic accuracy of the original cardiac CTA and FFR CT results. *Due to the fact that CT FFR represents a mathematically-derived analysis, it is recommended that the results be interpreted as follows:  1. CT FFR >0.80: Low likelihood of hemodynamic significance. 2. CT FFR 0.76-0.80: Borderline likelihood of hemodynamic significance. 3. CT FFR =< 0.75: High likelihood of hemodynamic significance. *Coronary CT Angiography-derived Fractional Flow Reserve Testing in Patients with Stable Coronary Artery Disease: Recommendations on Interpretation and Reporting. Radiology: Cardiothoracic Imaging. 2019;1(5):e190050 FINDINGS: 1. Left Main: 0.99; low likelihood of hemodynamic significance. 2. Prox LAD: 0.98; low likelihood of hemodynamic significance. 3. Mid LAD: 0.95; low likelihood of hemodynamic significance. 4. Distal LAD: 0.83; low likelihood of hemodynamic significance. 5. D2: 0.84; low likelihood of hemodynamic significance. 6. LCX: 0.99; low likelihood of hemodynamic significance. 7. Prox RCA: 0.96; low likelihood of hemodynamic significance. 8. Mid RCA: 0.94; low likelihood of hemodynamic significance. 9. Distal RCA: 0.94; low likelihood of hemodynamic significance. IMPRESSION: 1.  CT FFR negative. Eleonore Chiquito, MD Electronically Signed   By: Eleonore Chiquito M.D.   On: 02/25/2022 21:57    Cardiac Studies   Coronary CTA 1. Coronary calcium score of 1915. This was 97th percentile for age-, sex, and race-matched controls. Plaque volume 1254 mm3.   2. Normal coronary origin with right dominance.   3. Mild calcified plaque (25-49%) in the left main, LAD, and LCX.   4. Moderate calcified plaque in the mid RCA (50-69%).   5. 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 Thickening at the base of the RCC/LCC leaflets which may represent vegetation. 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   6. Dilated pulmonary artery suggestive of pulmonary hypertension.  Patient Profile     76 y.o. male with a history of aortic valve replacement who presents with aortic valve endocarditis  Assessment  & Plan    Aortic valve endocarditis: Has extensive aortic disease abscess with aortopulmonary fistula.  No signs of infection.  Have consulted both cardiac surgery and infectious disease.  She Bejamin Hackbart need TEE to further evaluate.  Nonobstructive coronary artery disease: Elevated calcium score.  CT FFR negative.  Continue aspirin and high intensity statin.  Heart failure with preserved ejection fraction: No obvious volume overload.  Continue torsemide and Aldactone  Hypertension: Currently well controlled  Hyperlipidemia: Continue Lipitor 80 mg daily  Type 2 diabetes: Currently on sliding scale.  Holding home medications.  Hypothyroidism: Continue Synthroid 125 mg daily.  Metastatic prostate cancer: Continue home Zytiga and prednisone.  For questions or updates, please contact [place] Please consult [email or web address] for contact info under  Signed, Janis Cuffe Meredith Leeds, MD  02/27/2022, 10:36 AM

## 2022-02-27 NOTE — Consult Note (Signed)
[facility]    Date of Admission:  02/26/2022     Reason for Consult: ct coronary concerning for recurrent endocarditis    Referring Provider: Burt Knack   Abx: 7/8-c vanc 7/8-c ceftriaxone        Assessment: 76 yo male hx AS, dm2, ckd3, prostate cancer on treatment, hx AV endocarditis with strep anginosus s/p avr, doing well but admitted for concerning changes on ct coronary for endocarditis  I reviewed operative note from 9/23. At the time, severe AI and Aorta-pulm fistula are seen along with aortic root abscess involving the left sinus of valsalva. Aortic root was replaced and the a-p fistula was repaired. Valve/vegetation culture grew strep anginosus. 9/21 admission bcx 2 of 2 sets also strep anginosus. Repeat bcx 9/24 negative. He had 6 weeks of ceftriaxone by 06/26/2020 and 3 subsequent doses of dalbavancin 11/5 and 07/07/2020 and 08/18/2020.  Patient also had complete dental extraction by 08/22/2020  He had done well without sepsis until 01/2022 where some LE edema was noted.   A periodic tte was obtained 02/10/22 which showed similar mild peri-aortic valvular leak and doppler flow suggesting A-P fistula presence; the valve structure was not well visualized. A ct coronary was done which showed "extensive aortic root abscess with pseudoaneurysm of the LVOT as described above. 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 And his esr is 3 and albumin level is normal.   He is on minimal dose of prednisone and I do not believe that dose would suppress sed rate that much    A TEE is pending at this time  I attempt to discuss case with cardiology to get their thought on the imaging finding and other potential causes  This is a rather perplexing picture. The ct finding is concerning but doesn't appear to fit clinically.   Blood cultures 2  sets were obtained   -------- I was able to discuss case with Dr Burt Knack who agrees strange presentation of imaging finding and lack of sign/labs suggestive of IE so far  I did mention that p-acnes is known to cause delayed infection with low esr and often dx'ed later   Darden Dates is, again, for this Monday to clarify finding of ct scan   Plan: As blood cultures have been obtained, given ct finding (and cardiology is very worried this is an infectious process), could start dapto/ceftriaxone If repair is to be done please send tissue for culture and also send tissue (fresh tissue, not formalinized tissue) to [facility] for molecular microbiology testing Await tee  Discussed case with primary team    I spent 75 minute reviewing data/chart, and coordinating care and >50% direct face to face time providing counseling/discussing diagnostics/treatment plan with patient       ------------------------------------------------ Active Problems:   Endocarditis    HPI: Carlon Chaloux. is a 76 y.o. male  hx AS, dm2, ckd3, prostate cancer on treatment, hx AV endocarditis with strep anginosus s/p avr, doing well but admitted for concerning changes on ct coronary for endocarditis   I reviewed operative note from 9/23. At the time, severe AI and Aorta-pulm fistula are seen along with aortic root abscess involving the left sinus of valsalva. Aortic root was replaced and the a-p fistula was repaired. Valve/vegetation culture grew strep anginosus. 9/21 admission bcx 2 of 2 sets also strep anginosus.  Repeat bcx 9/24 negative. He had 6 weeks of ceftriaxone by 06/26/2020 and 3 subsequent doses of dalbavancin 11/5 and 07/07/2020 and 08/18/2020.  Patient also had complete dental extraction by 08/22/2020   He has been at baseline health without decline in activity tolerance or exhibiting acute sign of chf  He reports chronic bilateral le edema with orthopnea/pnd  Routine tte done 01/2022  showed similarly appear av prosthesis with mild perivalvular leak and abnormal doppler suggesting persistent A-P fistula  He had a ct coronary done on f/u that showed thickened AV of the prosthesis and possible paravalvular abscess  Again he is exhibiting no sign of sepsis/illness otherwise  His crp is 3  Bcx was drawn  A tee is planned  He is eating lunch and corroborate story   Family History  Problem Relation Age of Onset   Hypertension Other    Breast cancer Sister    Breast cancer Other        family history   Breast cancer Brother        prostate ca   Aneurysm Mother        Brain aneurysm    Prostate cancer Father    Colon cancer Neg Hx    Esophageal cancer Neg Hx    Stomach cancer Neg Hx    Rectal cancer Neg Hx     Social History   Tobacco Use   Smoking status: Former   Smokeless tobacco: Never   Tobacco comments:    quit 2005  Vaping Use   Vaping Use: Never used  Substance Use Topics   Alcohol use: Not Currently    Alcohol/week: 14.0 standard drinks of alcohol    Types: 14 Cans of beer per week   Drug use: No    No Known Allergies  Review of Systems: ROS All Other ROS was negative, except mentioned above   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[place]) 05/14/2020   History of nuclear stress test    Myoview 8/18: EF 64, inferobasal thinning, no ischemia, low risk   HYPERLIPIDEMIA 07/06/2007   HYPERTENSION 07/06/2007   HYPOTHYROIDISM 07/06/2007   Leg pain    ABIs 8/18:  Normal    Obesity    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   23 mm human aortic root graft with 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 and reimplantation of left main and  right coronary arteries   Severe aortic insufficiency 05/13/2020   acute onset in setting of bacterial endocarditis   Severe aortic stenosis        Scheduled Meds:  abiraterone acetate  1,000 mg Oral Daily   aspirin EC  81 mg Oral Daily   atorvastatin  80 mg Oral Daily   doxazosin  8 mg Oral QHS   enoxaparin (LOVENOX) injection  40 mg Subcutaneous Q24H   ferrous sulfate  325 mg Oral Daily   insulin aspart  0-15 Units Subcutaneous TID WC   labetalol  400 mg Oral QHS   labetalol  600 mg Oral Daily   levothyroxine  125 mcg Oral Daily   pantoprazole  40 mg Oral Daily   potassium chloride SA  20 mEq Oral  Daily   predniSONE  5 mg Oral Q breakfast   spironolactone  25 mg Oral QODAY   torsemide  20 mg Oral Daily   Continuous Infusions:  sodium chloride 20 mL/hr at 02/26/22 1743   PRN Meds:.acetaminophen, nitroGLYCERIN, ondansetron (ZOFRAN) IV   OBJECTIVE: Blood pressure 122/60, pulse 61, temperature 98.1 F (36.7 C), temperature source Oral, resp. rate 11, weight 86.9 kg, SpO2 100 %.  Physical Exam  General/constitutional: no distress, pleasant HEENT: Normocephalic, PER, Conj Clear, EOMI, Oropharynx clear Neck supple CV: rrr with systolic murmur throughout precordium Lungs: clear to auscultation, normal respiratory effort Abd: Soft, Nontender Ext: trace bilateral LE edema Skin: No Rash Neuro: nonfocal MSK: no peripheral joint swelling/tenderness/warmth; back spines nontender     Lab Results Lab Results  Component Value Date   WBC 5.3 02/26/2022   HGB 11.2 (L) 02/26/2022   HCT 35.1 (L) 02/26/2022   MCV 87.1 02/26/2022   PLT 149 (L) 02/26/2022    Lab Results  Component Value Date   CREATININE 1.39 (H) 02/26/2022   BUN 24 (H) 02/26/2022   NA 142 02/26/2022   K 3.8 02/26/2022   CL 105 02/26/2022   CO2 26 02/26/2022    Lab Results  Component Value Date   ALT 21 02/26/2022   AST 26 02/26/2022   ALKPHOS 46 02/26/2022   BILITOT 0.8 02/26/2022       Microbiology: Recent Results (from the past 240 hour(s))  Culture, blood (Routine X 2) w Reflex to ID Panel     Status: None (Preliminary result)   Collection Time: 02/26/22  6:16 PM   Specimen: BLOOD  Result Value Ref Range Status   Specimen Description BLOOD RIGHT ANTECUBITAL  Final   Special Requests   Final    BOTTLES DRAWN AEROBIC AND ANAEROBIC Blood Culture adequate volume   Culture   Final    NO GROWTH < 24 HOURS Performed at [facility], [address] [place], [place] [address]    Report Status PENDING  Incomplete  Culture, blood (Routine X 2) w Reflex to ID Panel     Status: None (Preliminary result)   Collection Time: 02/26/22  6:22 PM   Specimen: BLOOD  Result Value Ref Range Status   Specimen Description BLOOD BLOOD RIGHT FOREARM  Final   Special Requests   Final    BOTTLES DRAWN AEROBIC AND ANAEROBIC Blood Culture adequate volume   Culture   Final    NO GROWTH < 24 HOURS Performed at [facility], [place] [address] [place], [place] [address]    Report Status PENDING  Incomplete     Serology:    Imaging: If present, new imagings (plain films, ct scans, and mri) have been personally visualized and interpreted; radiology reports have been reviewed. Decision making incorporated into the Impression / Recommendations.  02/10/22 tte 1. Left ventricular ejection fraction, by estimation, is 60 to 65%. The left ventricle has normal function. The left ventricle has no regional wall motion abnormalities. There is moderate left ventricular hypertrophy of the basal-septal segment. Left ventricular diastolic parameters are indeterminate.   2. Right ventricular systolic function is normal. The right ventricular size is normal. There is mildly elevated pulmonary artery systolic pressure.   3. Left atrial size was severely dilated.   4. Right atrial size was mildly dilated.   5. The mitral valve is normal in structure. Mild mitral valve regurgitation. No evidence  of mitral stenosis.   6. History of aortic valve  endocarditis with aortic root abscess and aorta to pulmonary artery fistula. This likely accounts for atypical location of aortc valve in LVOT. Prior echoes reviewed, valve appears unchanged from prior. There is evidence of turbulent flow and trivial perivalvular leak (location of native right coronary cusp), adjacent to PA and likely prior site of aorta-PA fistula. There is also mild central valve aortic regurgitation. The aortic valve has been repaired/replaced. Aortic valve regurgitation is mild. There is a 23 mm human allograft valve present in the aortic position. Procedure Date: 05/15/20. Echo findings are consistent with perivalvular leak of the aortic prosthesis.   7. Aortic root has repair at site of prior abscess/fistula. See comments re: aortic valve replacement. Aortic root/ascending aorta has been repaired/replaced.   8. The inferior vena cava is normal in size with greater than 50% respiratory variability, suggesting right atrial pressure of 3 mmHg.   Comparison(s): Prior images reviewed side by side. See comments re: aortic valve; atypical placement given history of abscess/fistula in aortic root.   Conclusion(s)/Recommendation(s): In parasternal short axis, there is significant color doppler flow in the aortic root/sinus of Valsalva on the [place] location. This color signal is seen in PA as well. In some views it does not appear to travel between aorta and PA, but in other views the strong color flow is concerning for  communication. Would consider TEE to evaluate aortic root further and exclude aorta to PA residual shunting. Findings communicated to ordering provider and primary cardiologist.   7/07 ct coronary 1. Coronary calcium score of 1915. This was 97th percentile for age-, sex, and race-matched controls. Plaque volume 1254 mm3. 2. Normal coronary origin with right dominance. 3. Mild calcified plaque (25-49%) in the left main, LAD, and  LCX. 4. Moderate calcified plaque in the mid RCA (50-69%). 5. 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 Thickening at the base of the RCC/LCC leaflets which may represent vegetation. 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 6. Dilated pulmonary artery suggestive of pulmonary hypertension.   Jabier Mutton, [place] [facility] [place] [phone] pager    02/27/2022, 10:06 AM

## 2022-02-28 DIAGNOSIS — T826XXA Infection and inflammatory reaction due to cardiac valve prosthesis, initial encounter: Secondary | ICD-10-CM | POA: Diagnosis not present

## 2022-02-28 DIAGNOSIS — I38 Endocarditis, valve unspecified: Secondary | ICD-10-CM | POA: Diagnosis not present

## 2022-02-28 LAB — GLUCOSE, CAPILLARY
Glucose-Capillary: 146 mg/dL — ABNORMAL HIGH (ref 70–99)
Glucose-Capillary: 141 mg/dL — ABNORMAL HIGH (ref 70–99)
Glucose-Capillary: 147 mg/dL — ABNORMAL HIGH (ref 70–99)
Glucose-Capillary: 121 mg/dL — ABNORMAL HIGH (ref 70–99)
Glucose-Capillary: 166 mg/dL — ABNORMAL HIGH (ref 70–99)
Glucose-Capillary: 110 mg/dL — ABNORMAL HIGH (ref 70–99)
Glucose-Capillary: 153 mg/dL — ABNORMAL HIGH (ref 70–99)

## 2022-02-28 NOTE — Progress Notes (Signed)
Progress Note  Patient Name: Matthew Serrano. Date of Encounter: 02/28/2022  [facility] Cardiologist: [name], MD   Subjective   Feeling well without chest pain or shortness of breath.  TEE scheduled for tomorrow.  Inpatient Medications    Scheduled Meds:  abiraterone acetate  1,000 mg Oral QAC breakfast   aspirin EC  81 mg Oral Daily   atorvastatin  80 mg Oral Daily   doxazosin  8 mg Oral QHS   enoxaparin (LOVENOX) injection  40 mg Subcutaneous Q24H   ferrous sulfate  325 mg Oral Daily   insulin aspart  0-15 Units Subcutaneous TID WC   labetalol  400 mg Oral QHS   labetalol  600 mg Oral Daily   levothyroxine  125 mcg Oral Daily   pantoprazole  40 mg Oral Daily   potassium chloride SA  20 mEq Oral Daily   predniSONE  5 mg Oral Q breakfast   spironolactone  25 mg Oral QODAY   torsemide  20 mg Oral Daily   Continuous Infusions:  sodium chloride Stopped (02/27/22 1900)   cefTRIAXone (ROCEPHIN)  IV 2 g (02/27/22 1259)   DAPTOmycin (CUBICIN) 700 mg in sodium chloride 0.9 % IVPB 700 mg (02/27/22 1401)   PRN Meds: acetaminophen, nitroGLYCERIN, ondansetron (ZOFRAN) IV   Vital Signs    Vitals:   02/27/22 1656 02/27/22 2020 02/27/22 2213 02/28/22 0459  BP: 136/68 136/68 (!) 159/73 (!) 148/76  Pulse: (!) 58 (!) 56 (!) 55 (!) 58  Resp: 11 (!) '21 11 16  '$ Temp: 98.5 F (36.9 C) 98.1 F (36.7 C) 98.5 F (36.9 C) 98.5 F (36.9 C)  TempSrc: Oral Oral Oral Oral  SpO2: 96% 100% 99% 100%  Weight:    88.2 kg    Intake/Output Summary (Last 24 hours) at 02/28/2022 0847 Last data filed at 02/28/2022 0500 Gross per 24 hour  Intake 1433.32 ml  Output 1225 ml  Net 208.32 ml       02/28/2022    4:59 AM 02/27/2022    4:36 AM 02/19/2022    8:17 AM  Last 3 Weights  Weight (lbs) 194 lb 7.1 oz 191 lb 9.3 oz 200 lb 3.2 oz  Weight (kg) 88.2 kg 86.9 kg 90.81 kg      Telemetry    Sinus rhythm-personally reviewed  ECG    None new- Personally Reviewed  Physical Exam    GEN: Well nourished, well developed, in no acute distress  HEENT: normal  Neck: no JVD, carotid bruits, or masses Cardiac: RRR; 3/6 systolic murmur at the base Respiratory:  clear to auscultation bilaterally, normal work of breathing GI: soft, nontender, nondistended, + BS MS: no deformity or atrophy  Skin: warm and dry Neuro:  Strength and sensation are intact Psych: euthymic mood, full affect   Labs    High Sensitivity Troponin:  No results for input(s): "TROPONINIHS" in the last 720 hours.   Chemistry Recent Labs  Lab 02/26/22 1816  NA 142  K 3.8  CL 105  CO2 26  GLUCOSE 207*  BUN 24*  CREATININE 1.39*  CALCIUM 8.8*  PROT 5.9*  ALBUMIN 3.7  AST 26  ALT 21  ALKPHOS 46  BILITOT 0.8  GFRNONAA 53*  ANIONGAP 11     Lipids No results for input(s): "CHOL", "TRIG", "HDL", "LABVLDL", "LDLCALC", "CHOLHDL" in the last 168 hours.  Hematology Recent Labs  Lab 02/26/22 1816  WBC 5.3  RBC 4.03*  HGB 11.2*  HCT 35.1*  MCV  87.1  MCH 27.8  MCHC 31.9  RDW 15.1  PLT 149*    Thyroid  Recent Labs  Lab 02/26/22 1816  TSH 0.301*     BNPNo results for input(s): "BNP", "PROBNP" in the last 168 hours.  DDimer No results for input(s): "DDIMER" in the last 168 hours.   Radiology    No results found.  Cardiac Studies   Coronary CTA 1. Coronary calcium score of 1915. This was 97th percentile for age-, sex, and race-matched controls. Plaque volume 1254 mm3.   2. Normal coronary origin with right dominance.   3. Mild calcified plaque (25-49%) in the left main, LAD, and LCX.   4. Moderate calcified plaque in the mid RCA (50-69%).   5. 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 Thickening at the base of the RCC/LCC leaflets which may represent vegetation. 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   6. Dilated pulmonary artery suggestive of  pulmonary hypertension.  Patient Profile     76 y.o. male with a history of aortic valve replacement who presents with aortic valve endocarditis  Assessment & Plan    Aortic valve endocarditis: Extensive aortic disease abscess with aortopulmonary fistula.  No signs of infection.  Blood cultures negative thus far.  Plan for TEE tomorrow and multidisciplinary discussion on further therapy post TEE.  Nonobstructive coronary artery disease: Elevated calcium score.  CT FFR negative.  Continue aspirin and high intensity statin  Heart failure with preserved ejection fraction: No obvious volume overload.  Continue torsemide and Aldactone  Hypertension: Currently well controlled  Hyperlipidemia: Continue Lipitor 80 mg  Type 2 diabetes: Continue sliding scale insulin  Hypothyroidism: Continue Synthroid 125 mg daily  Metastatic prostate cancer: Continue home Zytiga and prednisone   For questions or updates, please contact [facility] Please consult [email or web address] for contact info under        Signed, Brode Sculley Meredith Leeds, MD  02/28/2022, 8:47 AM

## 2022-03-01 DIAGNOSIS — I358 Other nonrheumatic aortic valve disorders: Secondary | ICD-10-CM | POA: Diagnosis not present

## 2022-03-01 DIAGNOSIS — I39 Endocarditis and heart valve disorders in diseases classified elsewhere: Secondary | ICD-10-CM | POA: Diagnosis not present

## 2022-03-01 DIAGNOSIS — I330 Acute and subacute infective endocarditis: Secondary | ICD-10-CM | POA: Diagnosis not present

## 2022-03-01 LAB — BASIC METABOLIC PANEL
Anion gap: 14 (ref 5–15)
BUN: 18 mg/dL (ref 8–23)
CO2: 24 mmol/L (ref 22–32)
Calcium: 8.3 mg/dL — ABNORMAL LOW (ref 8.9–10.3)
Chloride: 104 mmol/L (ref 98–111)
Creatinine, Ser: 1.07 mg/dL (ref 0.61–1.24)
GFR, Estimated: >60 mL/min (ref >60)
Glucose, Bld: 145 mg/dL — ABNORMAL HIGH (ref 70–99)
Potassium: 3.3 mmol/L — ABNORMAL LOW (ref 3.5–5.1)
Sodium: 142 mmol/L (ref 135–145)

## 2022-03-01 LAB — CBC
HCT: 34.3 % — ABNORMAL LOW (ref 39.0–52.0)
Hemoglobin: 11 g/dL — ABNORMAL LOW (ref 13.0–17.0)
MCH: 28.3 pg (ref 26.0–34.0)
MCHC: 32.1 g/dL (ref 30.0–36.0)
MCV: 88.2 fL (ref 80.0–100.0)
Platelets: 126 10*3/uL — ABNORMAL LOW (ref 150–400)
RBC: 3.89 MIL/uL — ABNORMAL LOW (ref 4.22–5.81)
RDW: 15.1 % (ref 11.5–15.5)
WBC: 4.8 10*3/uL (ref 4.0–10.5)
nRBC: 0 % (ref 0.0–0.2)

## 2022-03-01 LAB — GLUCOSE, CAPILLARY
Glucose-Capillary: 89 mg/dL (ref 70–99)
Glucose-Capillary: 154 mg/dL — ABNORMAL HIGH (ref 70–99)
Glucose-Capillary: 147 mg/dL — ABNORMAL HIGH (ref 70–99)
Glucose-Capillary: 176 mg/dL — ABNORMAL HIGH (ref 70–99)

## 2022-03-01 NOTE — Progress Notes (Signed)
[facility]  Date of Admission:  02/26/2022   Total days of inpatient antibiotics 2  Active Problems:   Endocarditis          Assessment: 82 YM with ct coronary c/f recurrent endocarditis: #Hx of AV endocarditis with strep anginosus SP AVR on 05/15/20 #Keloid scarring at surgical incision -Blood CX on 9/21 2/2+ strep anginosus, 05/15/20 valve/vegetation Cx+ strep anginosus,  -6 weeks of antibiotics with ceftriaxone by 06/26/2020 and 3 subsequent doses of dalbavancin 11/5 and 07/07/2020 and 08/18/2020. 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 Thickening at the base of the RCC/LCC leaflets which may represent vegetation. 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 -Per CTS(Dr. Roxan Hockey review of echo on 5/22 showed cavity of old abscess, not significantly changed on recent echo. Given no leukocytosis, fever, suspect chronic process rather than infection. Awaiting TEE. Started on dapt+ ceftriaxone. -Pt reports tenderness at chest surgical incision site has been present since initial heart surgery. No drainage reported. CT coronary did not note any concerning derma/subq findings.  Recommendations: -Continue ceftriaxone and daptomycin -Follow-up TEE -Follow blood Cx -Plan to review Ct with radiology in regards to keloid and any infectious concerns.    Microbiology:   Antibiotics: Ceftriaxone and daptomycin 7/8-p Cultures: Blood 7/7 NG    SUBJECTIVE: Resting in bed. He denies fever/chills. States he had sternal chest pain at site of incision   Review of Systems: Review of Systems  All other systems reviewed and are negative.    Scheduled Meds:  abiraterone acetate  1,000 mg Oral QAC breakfast   aspirin EC  81 mg Oral Daily   atorvastatin  80 mg Oral Daily   doxazosin  8  mg Oral QHS   enoxaparin (LOVENOX) injection  40 mg Subcutaneous Q24H   ferrous sulfate  325 mg Oral Daily   insulin aspart  0-15 Units Subcutaneous TID WC   labetalol  400 mg Oral QHS   labetalol  600 mg Oral Daily   levothyroxine  125 mcg Oral Daily   pantoprazole  40 mg Oral Daily   potassium chloride SA  20 mEq Oral Daily   predniSONE  5 mg Oral Q breakfast   spironolactone  25 mg Oral QODAY   torsemide  20 mg Oral Daily   Continuous Infusions:  sodium chloride Stopped (02/27/22 1900)   cefTRIAXone (ROCEPHIN)  IV 2 g (03/01/22 1112)   DAPTOmycin (CUBICIN) 700 mg in sodium chloride 0.9 % IVPB Stopped (02/28/22 2110)   PRN Meds:.acetaminophen, nitroGLYCERIN, ondansetron (ZOFRAN) IV No Known Allergies  OBJECTIVE: Vitals:   02/28/22 1300 02/28/22 2020 03/01/22 0448 03/01/22 1303  BP: 111/64 132/71 (!) 146/73 110/65  Pulse: (!) 59 60 63 64  Resp: '15 16 17 20  '$ Temp: 98.5 F (36.9 C) 99.4 F (37.4 C) 99 F (37.2 C) 98.8 F (37.1 C)  TempSrc: Oral Oral Oral Oral  SpO2: 97% 98% 98% 98%  Weight:   90.2 kg    Body mass index is 30.23 kg/m.  Physical Exam Constitutional:      General: He is not in acute distress.    Appearance: He is normal weight. He is not toxic-appearing.  HENT:     Head: Normocephalic and atraumatic.     Right Ear: External ear normal.     Left Ear: External ear  normal.     Nose: No congestion or rhinorrhea.     Mouth/Throat:     Mouth: Mucous membranes are moist.     Pharynx: Oropharynx is clear.  Eyes:     Extraocular Movements: Extraocular movements intact.     Conjunctiva/sclera: Conjunctivae normal.     Pupils: Pupils are equal, round, and reactive to light.  Cardiovascular:     Rate and Rhythm: Normal rate and regular rhythm.     Heart sounds: No murmur heard.    No friction rub. No gallop.  Pulmonary:     Effort: Pulmonary effort is normal.     Breath sounds: Normal breath sounds.  Abdominal:     General: Abdomen is flat. Bowel sounds  are normal.     Palpations: Abdomen is soft.  Musculoskeletal:        General: No swelling. Normal range of motion.     Cervical back: Normal range of motion and neck supple.  Skin:    General: Skin is warm and dry.     Comments: Midline chest surgical scar-keloid, tender on palpation  Neurological:     General: No focal deficit present.     Mental Status: He is oriented to person, place, and time.  Psychiatric:        Mood and Affect: Mood normal.       Lab Results Lab Results  Component Value Date   WBC 4.8 03/01/2022   HGB 11.0 (L) 03/01/2022   HCT 34.3 (L) 03/01/2022   MCV 88.2 03/01/2022   PLT 126 (L) 03/01/2022    Lab Results  Component Value Date   CREATININE 1.07 03/01/2022   BUN 18 03/01/2022   NA 142 03/01/2022   K 3.3 (L) 03/01/2022   CL 104 03/01/2022   CO2 24 03/01/2022    Lab Results  Component Value Date   ALT 21 02/26/2022   AST 26 02/26/2022   ALKPHOS 46 02/26/2022   BILITOT 0.8 02/26/2022        Laurice Record, MD [facility] 03/01/2022, 2:50 PM

## 2022-03-01 NOTE — Progress Notes (Signed)
Mobility Specialist Progress Note    03/01/22 1721  Mobility  Activity Ambulated independently in hallway  Level of Assistance Independent  Assistive Device None  Distance Ambulated (ft) 700 ft  Activity Response Tolerated well  $Mobility charge 1 Mobility   During Mobility: 96 HR Post-Mobility: 80 HR  Pt received in chair and agreeable. No complaints on walk. Returned to chair with call bell in reach.    Hildred Alamin Mobility Specialist

## 2022-03-01 NOTE — Progress Notes (Signed)
Procedure(s) (LRB): TRANSESOPHAGEAL ECHOCARDIOGRAM (TEE) (N/A) Subjective: No complaints, awaiting TEE  Objective: Vital signs in last 24 hours: Temp:  [98.8 F (37.1 C)-99.4 F (37.4 C)] 98.8 F (37.1 C) (07/10 1303) Pulse Rate:  [60-64] 64 (07/10 1303) Cardiac Rhythm: Sinus bradycardia (07/10 0701) Resp:  [16-20] 20 (07/10 1303) BP: (110-146)/(65-73) 110/65 (07/10 1303) SpO2:  [98 %] 98 % (07/10 1303) Weight:  [90.2 kg] 90.2 kg (07/10 0448)  Hemodynamic parameters for last 24 hours:    Intake/Output from previous day: 07/09 0701 - 07/10 0700 In: 100.7 [IV Piggyback:100.7] Out: -  Intake/Output this shift: Total I/O In: 0  Out: 201 [Urine:201]  General appearance: alert, cooperative, and no distress Neurologic: intact Heart: systolic murmur unchanged Lungs: clear to auscultation bilaterally  Lab Results: Recent Labs    02/26/22 1816 03/01/22 1133  WBC 5.3 4.8  HGB 11.2* 11.0*  HCT 35.1* 34.3*  PLT 149* 126*   BMET:  Recent Labs    02/26/22 1816 03/01/22 1133  NA 142 142  K 3.8 3.3*  CL 105 104  CO2 26 24  GLUCOSE 207* 145*  BUN 24* 18  CREATININE 1.39* 1.07  CALCIUM 8.8* 8.3*    PT/INR: No results for input(s): "LABPROT", "INR" in the last 72 hours. ABG    Component Value Date/Time   PHART 7.431 05/16/2020 0457   HCO3 23.2 05/16/2020 0457   TCO2 24 05/16/2020 0457   ACIDBASEDEF 1.0 05/16/2020 0457   O2SAT 92.0 05/16/2020 0457   CBG (last 3)  Recent Labs    02/28/22 2203 03/01/22 0749 03/01/22 1123  GLUCAP 89 154* 147*    Assessment/Plan: S/P Procedure(s) (LRB): TRANSESOPHAGEAL ECHOCARDIOGRAM (TEE) (N/A) - For TEE today I reviewed his echo from 12/2020. It shows flow into the old abscess cavity even back then.  Doesn't appear significantly changed in size from more recent echo.  Unable to discern if there was any flow into PA then.  His WBC is normal and he is afebrile. Blood cultures no growth to date.  I suspect this is a more  chronic process than first realized. Doubt it is an infectious process.  Operative correction would be at best difficult and likely to break down again, and at worst undoable.  Not sure that is best option currently.  Will review TEE once available.  LOS: 3 days    Melrose Nakayama 03/01/2022

## 2022-03-01 NOTE — Progress Notes (Signed)
Progress Note  Patient Name: Matthew Serrano. Date of Encounter: 03/01/2022  Primary Cardiologist: [name], MD   Subjective   Patient seen examined at his bedside.  He was awake when I arrived.  He also no complaints at this time.  He is looking forward to his TEE.  TEE scheduled for this morning  Inpatient Medications    Scheduled Meds:  abiraterone acetate  1,000 mg Oral QAC breakfast   aspirin EC  81 mg Oral Daily   atorvastatin  80 mg Oral Daily   doxazosin  8 mg Oral QHS   enoxaparin (LOVENOX) injection  40 mg Subcutaneous Q24H   ferrous sulfate  325 mg Oral Daily   insulin aspart  0-15 Units Subcutaneous TID WC   labetalol  400 mg Oral QHS   labetalol  600 mg Oral Daily   levothyroxine  125 mcg Oral Daily   pantoprazole  40 mg Oral Daily   potassium chloride SA  20 mEq Oral Daily   predniSONE  5 mg Oral Q breakfast   spironolactone  25 mg Oral QODAY   torsemide  20 mg Oral Daily   Continuous Infusions:  sodium chloride Stopped (02/27/22 1900)   cefTRIAXone (ROCEPHIN)  IV Stopped (02/28/22 1345)   DAPTOmycin (CUBICIN) 700 mg in sodium chloride 0.9 % IVPB Stopped (02/28/22 2110)   PRN Meds: acetaminophen, nitroGLYCERIN, ondansetron (ZOFRAN) IV   Vital Signs    Vitals:   02/28/22 0913 02/28/22 1300 02/28/22 2020 03/01/22 0448  BP: (!) 125/58 111/64 132/71 (!) 146/73  Pulse: 63 (!) 59 60 63  Resp:  '15 16 17  '$ Temp:  98.5 F (36.9 C) 99.4 F (37.4 C) 99 F (37.2 C)  TempSrc:  Oral Oral Oral  SpO2:  97% 98% 98%  Weight:    90.2 kg    Intake/Output Summary (Last 24 hours) at 03/01/2022 0845 Last data filed at 02/28/2022 2201 Gross per 24 hour  Intake 100.67 ml  Output --  Net 100.67 ml   Filed Weights   02/27/22 0436 02/28/22 0459 03/01/22 0448  Weight: 86.9 kg 88.2 kg 90.2 kg    Telemetry    Sinus rhythm- Personally Reviewed  ECG    None today- Personally Reviewed  Physical Exam    General: Comfortable Head: Atraumatic, normal size   Eyes: PEERLA, EOMI  Neck: Supple, normal JVD Cardiac: Normal S1, S2; RRR; diffuse 4 /6 precordial murmurs, rubs, or gallops Lungs: Clear to auscultation bilaterally Abd: Soft, nontender, no hepatomegaly  Ext: warm, no edema Musculoskeletal: No deformities, BUE and BLE strength normal and equal Skin: Warm and dry, no rashes   Neuro: Alert and oriented to person, place, time, and situation, CNII-XII grossly intact, no focal deficits  Psych: Normal mood and affect   Labs    Chemistry Recent Labs  Lab 02/26/22 1816  NA 142  K 3.8  CL 105  CO2 26  GLUCOSE 207*  BUN 24*  CREATININE 1.39*  CALCIUM 8.8*  PROT 5.9*  ALBUMIN 3.7  AST 26  ALT 21  ALKPHOS 46  BILITOT 0.8  GFRNONAA 53*  ANIONGAP 11     Hematology Recent Labs  Lab 02/26/22 1816  WBC 5.3  RBC 4.03*  HGB 11.2*  HCT 35.1*  MCV 87.1  MCH 27.8  MCHC 31.9  RDW 15.1  PLT 149*    Cardiac EnzymesNo results for input(s): "TROPONINI" in the last 168 hours. No results for input(s): "TROPIPOC" in the last 168 hours.  BNPNo results for input(s): "BNP", "PROBNP" in the last 168 hours.   DDimer No results for input(s): "DDIMER" in the last 168 hours.   Radiology    No results found.  Cardiac Studies   TTE 02/10/2022 IMPRESSIONS     1. Left ventricular ejection fraction, by estimation, is 60 to 65%. The  left ventricle has normal function. The left ventricle has no regional  wall motion abnormalities. There is moderate left ventricular hypertrophy  of the basal-septal segment. Left  ventricular diastolic parameters are indeterminate.   2. Right ventricular systolic function is normal. The right ventricular  size is normal. There is mildly elevated pulmonary artery systolic  pressure.   3. Left atrial size was severely dilated.   4. Right atrial size was mildly dilated.   5. The mitral valve is normal in structure. Mild mitral valve  regurgitation. No evidence of mitral stenosis.   6. 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 This likely accounts for atypical  location of aortc valve in LVOT. Prior echoes reviewed, valve appears  unchanged from prior. There is evidence of  turbulent flow and trivial perivalvular leak (location of native right  coronary cusp), adjacent to PA and likely prior site of aorta-PA fistula.  There is also mild central valve aortic regurgitation. The aortic valve  has been repaired/replaced. Aortic  valve regurgitation is mild. There is a 23 mm human allograft valve  present in the aortic position. Procedure Date: 05/15/20. Echo findings  are consistent with perivalvular leak of the aortic prosthesis.   7. Aortic root has repair at site of prior abscess/fistula. See comments  re: aortic valve replacement. Aortic root/ascending aorta has been  repaired/replaced.   8. The inferior vena cava is normal in size with greater than 50%  respiratory variability, suggesting right atrial pressure of 3 mmHg.   Comparison(s): Prior images reviewed side by side. See comments re: aortic  valve; atypical placement given history of abscess/fistula in aortic root.   Conclusion(s)/Recommendation(s): In parasternal short axis, there is  significant color doppler flow in the aortic root/sinus of Valsalva on the  [place] location. This color signal is seen in PA as well. In some views it  does not appear to travel between  aorta and PA, but in other views the strong color flow is concerning for  communication. Would consider TEE to evaluate aortic root further and  exclude aorta to PA residual shunting. Findings communicated to ordering  provider and primary cardiologist.   FINDINGS   Left Ventricle: Left ventricular ejection fraction, by estimation, is 60  to 65%. The left ventricle has normal function. The left ventricle has no  regional wall motion abnormalities. The left ventricular internal cavity  size was normal in  size. There is   moderate left ventricular hypertrophy of the basal-septal segment. Left  ventricular diastolic parameters are indeterminate.   Right Ventricle: The right ventricular size is normal. No increase in  right ventricular wall thickness. Right ventricular systolic function is  normal. There is mildly elevated pulmonary artery systolic pressure. The  tricuspid regurgitant velocity is 2.89   m/s, and with an assumed right atrial pressure of 3 mmHg, the estimated  right ventricular systolic pressure is 01.6 mmHg.   Left Atrium: Left atrial size was severely dilated.   Right Atrium: Right atrial size was mildly dilated.   Pericardium: There is no evidence of pericardial effusion.   Mitral Valve: The mitral  valve is normal in structure. Mild mitral valve  regurgitation. No evidence of mitral valve stenosis.   Tricuspid Valve: The tricuspid valve is normal in structure. Tricuspid  valve regurgitation is trivial. No evidence of tricuspid stenosis.   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 This likely accounts for  atypical location of aortc valve in LVOT. Prior echoes reviewed, valve  appears unchanged from prior. There is  evidence of turbulent flow and trivial perivalvular leak (location of  native right coronary cusp), adjacent to PA and likely prior site of  aorta-PA fistula. There is also mild central valve aortic regurgitation.  The aortic valve has been  repaired/replaced. Aortic valve regurgitation is mild. Aortic  regurgitation PHT measures 469 msec. Aortic valve mean gradient measures  7.0 mmHg. Aortic valve peak gradient measures 14.1 mmHg. Aortic valve  area, by VTI measures 2.05 cm. There is a 23 mm  human allograft valve present in the aortic position. Procedure Date:  05/15/20.   Pulmonic Valve: The pulmonic valve was not well visualized. Pulmonic valve  regurgitation is not  visualized.   Aorta: Aortic root has repair at site of prior abscess/fistula. See  comments re: aortic valve replacement. The aortic root/ascending aorta has  been repaired/replaced and the aortic arch was not well visualized.   Venous: The inferior vena cava is normal in size with greater than 50%  respiratory variability, suggesting right atrial pressure of 3 mmHg.   IAS/Shunts: The atrial septum is grossly normal.    CCTA  FINDINGS: Image quality: Excellent.   Noise artifact is: Limited.   Ascending aorta: 37 mm   Aortic root/valve: A 23 mm human aortic graft is present. The root is normal size (NCC 31 mm; RCC 30 mm; LCC 34 mm). There is evidence of extensive aortic root abscess that extends into the LVOT. There is a large pseudoaneurysm under the RCC/LCC commissure in the LVOT. It measures 35 mm x 7.5 mm x 25 mm. 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 The leaflets or the prosthesis open well without restriction. There is thickening at the base of the RCC/LCC leaflets which may represent vegetation. Findings are consistent with endocarditis of the aortic graft.   Coronary Arteries:  Normal coronary origin.  Right dominance.   Left main: The left main is a large caliber vessel with a normal take off from the left coronary cusp that bifurcates to form a left anterior descending artery and a left circumflex artery. There is mild calcified plaque (25-49%).   Left anterior descending artery: The proximal LAD contains minimal calcified plaque (<25%). The mid contains mild mixed density plaque (25-49%). The distal LAD is patent. D1 contains minimal mixed density plaque (<25%). D2 contains mild mixed density plaque (25-49%). D3 contains minimal mixed density plaque (<25%).   Left circumflex artery: The LCX is non-dominant. The proximal LCX contains minimal calcified plaque  (<25%). The distal LCX contains mild calcified plaque (25-49%). OM1 is patent. OM2 contains minimal mixed density plaque (<25%). OM3 is patent.   Right coronary artery: The RCA is dominant with normal take off from the right coronary cusp. The proximal RCA contains minimal mixed density plaque (<25%). The mid RCA contains moderate calcified plaque (50-69%). The distal RCA contains mild calcified plaque (25-49%). The RCA terminates as a PDA and right posterolateral branch without evidence of plaque or  stenosis.   Right Atrium: Right atrial size is within normal limits.   Right Ventricle: The right ventricular cavity is within normal limits.   Left Atrium: Left atrial size is normal in size with no left atrial appendage filling defect.   Left Ventricle: The ventricular cavity size is within normal limits.   Pulmonary arteries: Mildly dilated suggestive of pulmonary hypertension.   Pulmonary veins: Normal pulmonary venous drainage.   Pericardium: Normal thickness without significant effusion or calcium present.   Cardiac valves: The aortic valve is trileaflet without significant calcification. The mitral valve is normal without significant calcification.   Aorta: Normal caliber without significant disease.   Extra-cardiac findings: See attached radiology report for non-cardiac structures.   IMPRESSION: 1. Coronary calcium score of 1915. This was 97th percentile for age-, sex, and race-matched controls. Plaque volume 1254 mm3.   2. Normal coronary origin with right dominance.   3. Mild calcified plaque (25-49%) in the left main, LAD, and LCX.   4. Moderate calcified plaque in the mid RCA (50-69%).   5. 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 Thickening at the base of the RCC/LCC leaflets which may represent vegetation. Findings are consistent with endocarditis of the  prosthesis.   6. Dilated pulmonary artery suggestive of pulmonary hypertension.   RECOMMENDATIONS: 1. Moderate stenosis in the mid RCA (50-69%). Consider symptom-guided anti-ischemic pharmacotherapy as well as risk factor modification per guideline directed care. Additional analysis with CT FFR will be submitted.   Eleonore Chiquito, MD   Electronically Signed   By: Eleonore Chiquito M.D.   On: 02/25/2022 21:52  Patient Profile     76 y.o. male history of aortic valve replacement who presents with aortic valve endocarditis   Assessment & Plan    Aortic Valve endocarditis - Plan for TEE this morning. Shared Decision Making/Informed Consent The risks [esophageal damage, perforation (1:10,000 risk), bleeding, pharyngeal hematoma as well as other potential complications associated with conscious sedation including aspiration, arrhythmia, respiratory failure and death], benefits (treatment guidance and diagnostic support) and alternatives of a transesophageal echocardiogram were discussed in detail with Mr. Cacciola and he is willing to proceed.    Further recommendation post TEE.  Nonobstructive coronary artery disease-no angina.  FFR negative continue aspirin and statin.  Heart failure preserved ejection fraction-no apparent fluid overload continue torsemide and Aldactone.  Hypertension-blood pressure at target.  Hyperlipidemia-continue current statin dose.  Hypothyroidism-continue Synthroid.  Metastatic prostate cancer-continue home regimen with Zytiga and prednisone   For questions or updates, please contact [facility] Please consult [email or web address] for contact info under Cardiology/STEMI.      Signed, Lynk Marti, DO  03/01/2022, 8:45 AM

## 2022-03-02 DIAGNOSIS — I330 Acute and subacute infective endocarditis: Secondary | ICD-10-CM | POA: Diagnosis not present

## 2022-03-02 LAB — GLUCOSE, CAPILLARY
Glucose-Capillary: 123 mg/dL — ABNORMAL HIGH (ref 70–99)
Glucose-Capillary: 137 mg/dL — ABNORMAL HIGH (ref 70–99)
Glucose-Capillary: 329 mg/dL — ABNORMAL HIGH (ref 70–99)
Glucose-Capillary: 247 mg/dL — ABNORMAL HIGH (ref 70–99)

## 2022-03-02 MED ORDER — SODIUM CHLORIDE 0.9 % IV SOLN: INTRAVENOUS | Status: DC

## 2022-03-02 NOTE — H&P (View-Only) (Signed)
Progress Note  Patient Name: Matthew Serrano. Date of Encounter: 03/02/2022  Primary Cardiologist: [name], MD   Subjective   Patient seen examined at his bedside.  He was awake when I arrived.  Fortunately the patient did not get the TEE yesterday.  He is scheduled for tomorrow.  Inpatient Medications    Scheduled Meds:  abiraterone acetate  1,000 mg Oral QAC breakfast   aspirin EC  81 mg Oral Daily   atorvastatin  80 mg Oral Daily   doxazosin  8 mg Oral QHS   enoxaparin (LOVENOX) injection  40 mg Subcutaneous Q24H   ferrous sulfate  325 mg Oral Daily   insulin aspart  0-15 Units Subcutaneous TID WC   labetalol  400 mg Oral QHS   labetalol  600 mg Oral Daily   levothyroxine  125 mcg Oral Daily   pantoprazole  40 mg Oral Daily   potassium chloride SA  20 mEq Oral Daily   predniSONE  5 mg Oral Q breakfast   spironolactone  25 mg Oral QODAY   torsemide  20 mg Oral Daily   Continuous Infusions:  sodium chloride Stopped (02/27/22 1900)   cefTRIAXone (ROCEPHIN)  IV Stopped (03/01/22 1143)   DAPTOmycin (CUBICIN) 700 mg in sodium chloride 0.9 % IVPB 700 mg (03/01/22 2146)   PRN Meds: acetaminophen, nitroGLYCERIN, ondansetron (ZOFRAN) IV   Vital Signs    Vitals:   03/01/22 1303 03/01/22 2050 03/02/22 0600 03/02/22 0821  BP: 110/65 131/67 (!) 147/71 (!) 156/81  Pulse: 64 (!) 58 (!) 57 60  Resp: '20 18 18   '$ Temp: 98.8 F (37.1 C) 99.4 F (37.4 C) 98.6 F (37 C)   TempSrc: Oral Oral Oral   SpO2: 98% 99% 96%   Weight:   89.4 kg     Intake/Output Summary (Last 24 hours) at 03/02/2022 0829 Last data filed at 03/02/2022 0604 Gross per 24 hour  Intake 360 ml  Output 551 ml  Net -191 ml   Filed Weights   02/28/22 0459 03/01/22 0448 03/02/22 0600  Weight: 88.2 kg 90.2 kg 89.4 kg    Telemetry    Sinus rhythm- Personally Reviewed  ECG    None today- Personally Reviewed  Physical Exam    General: Comfortable Head: Atraumatic, normal size  Eyes:  PEERLA, EOMI  Neck: Supple, normal JVD Cardiac: Normal S1, S2; RRR; diffuse 4 /6 precordial murmurs, rubs, or gallops Lungs: Clear to auscultation bilaterally Abd: Soft, nontender, no hepatomegaly  Ext: warm, no edema Musculoskeletal: No deformities, BUE and BLE strength normal and equal Skin: Warm and dry, no rashes   Neuro: Alert and oriented to person, place, time, and situation, CNII-XII grossly intact, no focal deficits  Psych: Normal mood and affect   Labs    Chemistry Recent Labs  Lab 02/26/22 1816 03/01/22 1133  NA 142 142  K 3.8 3.3*  CL 105 104  CO2 26 24  GLUCOSE 207* 145*  BUN 24* 18  CREATININE 1.39* 1.07  CALCIUM 8.8* 8.3*  PROT 5.9*  --   ALBUMIN 3.7  --   AST 26  --   ALT 21  --   ALKPHOS 46  --   BILITOT 0.8  --   GFRNONAA 53* >60  ANIONGAP 11 14     Hematology Recent Labs  Lab 02/26/22 1816 03/01/22 1133  WBC 5.3 4.8  RBC 4.03* 3.89*  HGB 11.2* 11.0*  HCT 35.1* 34.3*  MCV 87.1 88.2  MCH 27.8  28.3  MCHC 31.9 32.1  RDW 15.1 15.1  PLT 149* 126*    Cardiac EnzymesNo results for input(s): "TROPONINI" in the last 168 hours. No results for input(s): "TROPIPOC" in the last 168 hours.   BNPNo results for input(s): "BNP", "PROBNP" in the last 168 hours.   DDimer No results for input(s): "DDIMER" in the last 168 hours.   Radiology    No results found.  Cardiac Studies   TTE 02/10/2022 IMPRESSIONS     1. Left ventricular ejection fraction, by estimation, is 60 to 65%. The  left ventricle has normal function. The left ventricle has no regional  wall motion abnormalities. There is moderate left ventricular hypertrophy  of the basal-septal segment. Left  ventricular diastolic parameters are indeterminate.   2. Right ventricular systolic function is normal. The right ventricular  size is normal. There is mildly elevated pulmonary artery systolic  pressure.   3. Left atrial size was severely dilated.   4. Right atrial size was mildly  dilated.   5. The mitral valve is normal in structure. Mild mitral valve  regurgitation. No evidence of mitral stenosis.   6. 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 This likely accounts for atypical  location of aortc valve in LVOT. Prior echoes reviewed, valve appears  unchanged from prior. There is evidence of  turbulent flow and trivial perivalvular leak (location of native right  coronary cusp), adjacent to PA and likely prior site of aorta-PA fistula.  There is also mild central valve aortic regurgitation. The aortic valve  has been repaired/replaced. Aortic  valve regurgitation is mild. There is a 23 mm human allograft valve  present in the aortic position. Procedure Date: 05/15/20. Echo findings  are consistent with perivalvular leak of the aortic prosthesis.   7. Aortic root has repair at site of prior abscess/fistula. See comments  re: aortic valve replacement. Aortic root/ascending aorta has been  repaired/replaced.   8. The inferior vena cava is normal in size with greater than 50%  respiratory variability, suggesting right atrial pressure of 3 mmHg.   Comparison(s): Prior images reviewed side by side. See comments re: aortic  valve; atypical placement given history of abscess/fistula in aortic root.   Conclusion(s)/Recommendation(s): In parasternal short axis, there is  significant color doppler flow in the aortic root/sinus of Valsalva on the  [place] location. This color signal is seen in PA as well. In some views it  does not appear to travel between  aorta and PA, but in other views the strong color flow is concerning for  communication. Would consider TEE to evaluate aortic root further and  exclude aorta to PA residual shunting. Findings communicated to ordering  provider and primary cardiologist.   FINDINGS   Left Ventricle: Left ventricular ejection fraction, by estimation, is 60  to 65%. The left  ventricle has normal function. The left ventricle has no  regional wall motion abnormalities. The left ventricular internal cavity  size was normal in size. There is   moderate left ventricular hypertrophy of the basal-septal segment. Left  ventricular diastolic parameters are indeterminate.   Right Ventricle: The right ventricular size is normal. No increase in  right ventricular wall thickness. Right ventricular systolic function is  normal. There is mildly elevated pulmonary artery systolic pressure. The  tricuspid regurgitant velocity is 2.89   m/s, and with an assumed right atrial pressure of 3 mmHg, the estimated  right ventricular systolic pressure  is 36.4 mmHg.   Left Atrium: Left atrial size was severely dilated.   Right Atrium: Right atrial size was mildly dilated.   Pericardium: There is no evidence of pericardial effusion.   Mitral Valve: The mitral valve is normal in structure. Mild mitral valve  regurgitation. No evidence of mitral valve stenosis.   Tricuspid Valve: The tricuspid valve is normal in structure. Tricuspid  valve regurgitation is trivial. No evidence of tricuspid stenosis.   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 This likely accounts for  atypical location of aortc valve in LVOT. Prior echoes reviewed, valve  appears unchanged from prior. There is  evidence of turbulent flow and trivial perivalvular leak (location of  native right coronary cusp), adjacent to PA and likely prior site of  aorta-PA fistula. There is also mild central valve aortic regurgitation.  The aortic valve has been  repaired/replaced. Aortic valve regurgitation is mild. Aortic  regurgitation PHT measures 469 msec. Aortic valve mean gradient measures  7.0 mmHg. Aortic valve peak gradient measures 14.1 mmHg. Aortic valve  area, by VTI measures 2.05 cm. There is a 23 mm  human allograft valve present in the  aortic position. Procedure Date:  05/15/20.   Pulmonic Valve: The pulmonic valve was not well visualized. Pulmonic valve  regurgitation is not visualized.   Aorta: Aortic root has repair at site of prior abscess/fistula. See  comments re: aortic valve replacement. The aortic root/ascending aorta has  been repaired/replaced and the aortic arch was not well visualized.   Venous: The inferior vena cava is normal in size with greater than 50%  respiratory variability, suggesting right atrial pressure of 3 mmHg.   IAS/Shunts: The atrial septum is grossly normal.    CCTA  FINDINGS: Image quality: Excellent.   Noise artifact is: Limited.   Ascending aorta: 37 mm   Aortic root/valve: A 23 mm human aortic graft is present. The root is normal size (NCC 31 mm; RCC 30 mm; LCC 34 mm). There is evidence of extensive aortic root abscess that extends into the LVOT. There is a large pseudoaneurysm under the RCC/LCC commissure in the LVOT. It measures 35 mm x 7.5 mm x 25 mm. 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 The leaflets or the prosthesis open well without restriction. There is thickening at the base of the RCC/LCC leaflets which may represent vegetation. Findings are consistent with endocarditis of the aortic graft.   Coronary Arteries:  Normal coronary origin.  Right dominance.   Left main: The left main is a large caliber vessel with a normal take off from the left coronary cusp that bifurcates to form a left anterior descending artery and a left circumflex artery. There is mild calcified plaque (25-49%).   Left anterior descending artery: The proximal LAD contains minimal calcified plaque (<25%). The mid contains mild mixed density plaque (25-49%). The distal LAD is patent. D1 contains minimal mixed density plaque (<25%). D2 contains mild mixed density plaque (25-49%). D3  contains minimal mixed density plaque (<25%).   Left circumflex artery: The LCX is non-dominant. The proximal LCX contains minimal calcified plaque (<25%). The distal LCX contains mild calcified plaque (25-49%). OM1 is patent. OM2 contains minimal mixed density plaque (<25%). OM3 is patent.   Right coronary artery: The RCA is dominant with normal take off from the right coronary cusp. The  proximal RCA contains minimal mixed density plaque (<25%). The mid RCA contains moderate calcified plaque (50-69%). The distal RCA contains mild calcified plaque (25-49%). The RCA terminates as a PDA and right posterolateral branch without evidence of plaque or stenosis.   Right Atrium: Right atrial size is within normal limits.   Right Ventricle: The right ventricular cavity is within normal limits.   Left Atrium: Left atrial size is normal in size with no left atrial appendage filling defect.   Left Ventricle: The ventricular cavity size is within normal limits.   Pulmonary arteries: Mildly dilated suggestive of pulmonary hypertension.   Pulmonary veins: Normal pulmonary venous drainage.   Pericardium: Normal thickness without significant effusion or calcium present.   Cardiac valves: The aortic valve is trileaflet without significant calcification. The mitral valve is normal without significant calcification.   Aorta: Normal caliber without significant disease.   Extra-cardiac findings: See attached radiology report for non-cardiac structures.   IMPRESSION: 1. Coronary calcium score of 1915. This was 97th percentile for age-, sex, and race-matched controls. Plaque volume 1254 mm3.   2. Normal coronary origin with right dominance.   3. Mild calcified plaque (25-49%) in the left main, LAD, and LCX.   4. Moderate calcified plaque in the mid RCA (50-69%).   5. 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 Thickening at the base of the RCC/LCC leaflets which may represent vegetation. 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   6. Dilated pulmonary artery suggestive of pulmonary hypertension.   RECOMMENDATIONS: 1. Moderate stenosis in the mid RCA (50-69%). Consider symptom-guided anti-ischemic pharmacotherapy as well as risk factor modification per guideline directed care. Additional analysis with CT FFR will be submitted.   Eleonore Chiquito, MD   Electronically Signed   By: Eleonore Chiquito M.D.   On: 02/25/2022 21:52  Patient Profile     76 y.o. male history of aortic valve replacement who presents with aortic valve endocarditis   Assessment & Plan    Aortic Valve endocarditis -unfortunately patient did not get the TEE yesterday.  Plan for tomorrow.  He is aware of this. Further recommendation post TEE.  Nonobstructive coronary artery disease-no angina.  FFR negative continue aspirin and statin.  Heart failure preserved ejection fraction-no apparent fluid overload continue torsemide and Aldactone.  Hypertension-blood pressure at target.  Hyperlipidemia-continue current statin dose.  Hypothyroidism-continue Synthroid.  Metastatic prostate cancer-continue home regimen with Zytiga and prednisone   For questions or updates, please contact [place] Please consult [email or web address] for contact info under Cardiology/STEMI.      Signed, Antonietta Lansdowne, DO  03/02/2022, 8:29 AM

## 2022-03-02 NOTE — Care Management Important Message (Signed)
Important Message  Patient Details  Name: Matthew Serrano. MRN: [id number] Date of Birth: Sep 19, 1945   Medicare Important Message Given:  Yes     Saamir Armstrong [address] 03/02/2022, 3:49 PM

## 2022-03-02 NOTE — Anesthesia Preprocedure Evaluation (Signed)
Anesthesia Evaluation  Patient identified by MRN, date of birth, ID band Patient awake    Reviewed: Allergy & Precautions, NPO status , Patient's Chart, lab work & pertinent test results  Airway Mallampati: III  TM Distance: >3 FB Neck ROM: Full    Dental  (+) Edentulous Upper, Edentulous Lower, Dental Advisory Given   Pulmonary neg pulmonary ROS, former smoker,    Pulmonary exam normal breath sounds clear to auscultation       Cardiovascular hypertension, Pt. on home beta blockers and Pt. on medications + CAD and +CHF  Normal cardiovascular exam+ Valvular Problems/Murmurs (s/p Bioprosthetic AVR, Ao Root Replacment, Repair of Ao to PA fistula 04/2020) AS and AI  Rhythm:Regular Rate:Normal  TTE 2023 1. Left ventricular ejection fraction, by estimation, is 60 to 65%. The  left ventricle has normal function. The left ventricle has no regional  wall motion abnormalities. There is moderate left ventricular hypertrophy  of the basal-septal segment. Left  ventricular diastolic parameters are indeterminate.  2. Right ventricular systolic function is normal. The right ventricular  size is normal. There is mildly elevated pulmonary artery systolic  pressure.  3. Left atrial size was severely dilated.  4. Right atrial size was mildly dilated.  5. The mitral valve is normal in structure. Mild mitral valve  regurgitation. No evidence of mitral stenosis.  6. 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 This likely accounts for atypical  location of aortc valve in LVOT. Prior echoes reviewed, valve appears  unchanged from prior. There is evidence of  turbulent flow and trivial perivalvular leak (location of native right  coronary cusp), adjacent to PA and likely prior site of aorta-PA fistula.  There is also mild central valve aortic regurgitation. The aortic valve  has been  repaired/replaced. Aortic  valve regurgitation is mild. There is a 23 mm human allograft valve  present in the aortic position. Procedure Date: 05/15/20. Echo findings  are consistent with perivalvular leak of the aortic prosthesis.  7. Aortic root has repair at site of prior abscess/fistula. See comments  re: aortic valve replacement. Aortic root/ascending aorta has been  repaired/replaced.  8. The inferior vena cava is normal in size with greater than 50%  respiratory variability, suggesting right atrial pressure of 3 mmHg.   LHC 2021 Calcific aortic stenosis with mean aortic valve gradient 28 mmHg.  Aortic valve area 2.37 cm based upon dynamic expectedly high cardiac output of 10.3 L/min. Widely patent coronary arteries. LAD with eccentric 40 to 50% mid to distal narrowing.. Eccentric 50% mid RCA. 50% mid circumflex in the segment beyond the first obtuse marginal. Normal right heart pressures.  Normal pulmonary capillary wedge pressure, mean 8 mmHg.    Neuro/Psych negative neurological ROS  negative psych ROS   GI/Hepatic negative GI ROS, (+)     substance abuse  alcohol use,   Endo/Other  diabetes, Type 2, Oral Hypoglycemic AgentsHypothyroidism   Renal/GU Renal InsufficiencyRenal disease  negative genitourinary   Musculoskeletal negative musculoskeletal ROS (+)   Abdominal   Peds  Hematology  (+) Blood dyscrasia, anemia , Lab Results      Component                Value               Date                      WBC  4.8                 03/01/2022                HGB                      11.0 (L)            03/01/2022                HCT                      34.3 (L)            03/01/2022                MCV                      88.2                03/01/2022                PLT                      126 (L)             03/01/2022              Anesthesia Other Findings AV endocarditis  Reproductive/Obstetrics                           Anesthesia Physical Anesthesia Plan  ASA: 3  Anesthesia Plan: MAC   Post-op Pain Management:    Induction: Intravenous  PONV Risk Score and Plan: Propofol infusion and Treatment may vary due to age or medical condition  Airway Management Planned: Natural Airway  Additional Equipment:   Intra-op Plan:   Post-operative Plan:   Informed Consent: I have reviewed the patients History and Physical, chart, labs and discussed the procedure including the risks, benefits and alternatives for the proposed anesthesia with the patient or authorized representative who has indicated his/her understanding and acceptance.     Dental advisory given  Plan Discussed with: CRNA  Anesthesia Plan Comments:         Anesthesia Quick Evaluation

## 2022-03-02 NOTE — Progress Notes (Signed)
Progress Note  Patient Name: Matthew Serrano. Date of Encounter: 03/02/2022  Primary Cardiologist: [name], MD   Subjective   Patient seen examined at his bedside.  He was awake when I arrived.  Fortunately the patient did not get the TEE yesterday.  He is scheduled for tomorrow.  Inpatient Medications    Scheduled Meds:  abiraterone acetate  1,000 mg Oral QAC breakfast   aspirin EC  81 mg Oral Daily   atorvastatin  80 mg Oral Daily   doxazosin  8 mg Oral QHS   enoxaparin (LOVENOX) injection  40 mg Subcutaneous Q24H   ferrous sulfate  325 mg Oral Daily   insulin aspart  0-15 Units Subcutaneous TID WC   labetalol  400 mg Oral QHS   labetalol  600 mg Oral Daily   levothyroxine  125 mcg Oral Daily   pantoprazole  40 mg Oral Daily   potassium chloride SA  20 mEq Oral Daily   predniSONE  5 mg Oral Q breakfast   spironolactone  25 mg Oral QODAY   torsemide  20 mg Oral Daily   Continuous Infusions:  sodium chloride Stopped (02/27/22 1900)   cefTRIAXone (ROCEPHIN)  IV Stopped (03/01/22 1143)   DAPTOmycin (CUBICIN) 700 mg in sodium chloride 0.9 % IVPB 700 mg (03/01/22 2146)   PRN Meds: acetaminophen, nitroGLYCERIN, ondansetron (ZOFRAN) IV   Vital Signs    Vitals:   03/01/22 1303 03/01/22 2050 03/02/22 0600 03/02/22 0821  BP: 110/65 131/67 (!) 147/71 (!) 156/81  Pulse: 64 (!) 58 (!) 57 60  Resp: '20 18 18   '$ Temp: 98.8 F (37.1 C) 99.4 F (37.4 C) 98.6 F (37 C)   TempSrc: Oral Oral Oral   SpO2: 98% 99% 96%   Weight:   89.4 kg     Intake/Output Summary (Last 24 hours) at 03/02/2022 0829 Last data filed at 03/02/2022 0604 Gross per 24 hour  Intake 360 ml  Output 551 ml  Net -191 ml   Filed Weights   02/28/22 0459 03/01/22 0448 03/02/22 0600  Weight: 88.2 kg 90.2 kg 89.4 kg    Telemetry    Sinus rhythm- Personally Reviewed  ECG    None today- Personally Reviewed  Physical Exam    General: Comfortable Head: Atraumatic, normal size  Eyes:  PEERLA, EOMI  Neck: Supple, normal JVD Cardiac: Normal S1, S2; RRR; diffuse 4 /6 precordial murmurs, rubs, or gallops Lungs: Clear to auscultation bilaterally Abd: Soft, nontender, no hepatomegaly  Ext: warm, no edema Musculoskeletal: No deformities, BUE and BLE strength normal and equal Skin: Warm and dry, no rashes   Neuro: Alert and oriented to person, place, time, and situation, CNII-XII grossly intact, no focal deficits  Psych: Normal mood and affect   Labs    Chemistry Recent Labs  Lab 02/26/22 1816 03/01/22 1133  NA 142 142  K 3.8 3.3*  CL 105 104  CO2 26 24  GLUCOSE 207* 145*  BUN 24* 18  CREATININE 1.39* 1.07  CALCIUM 8.8* 8.3*  PROT 5.9*  --   ALBUMIN 3.7  --   AST 26  --   ALT 21  --   ALKPHOS 46  --   BILITOT 0.8  --   GFRNONAA 53* >60  ANIONGAP 11 14     Hematology Recent Labs  Lab 02/26/22 1816 03/01/22 1133  WBC 5.3 4.8  RBC 4.03* 3.89*  HGB 11.2* 11.0*  HCT 35.1* 34.3*  MCV 87.1 88.2  MCH 27.8  28.3  MCHC 31.9 32.1  RDW 15.1 15.1  PLT 149* 126*    Cardiac EnzymesNo results for input(s): "TROPONINI" in the last 168 hours. No results for input(s): "TROPIPOC" in the last 168 hours.   BNPNo results for input(s): "BNP", "PROBNP" in the last 168 hours.   DDimer No results for input(s): "DDIMER" in the last 168 hours.   Radiology    No results found.  Cardiac Studies   TTE 02/10/2022 IMPRESSIONS     1. Left ventricular ejection fraction, by estimation, is 60 to 65%. The  left ventricle has normal function. The left ventricle has no regional  wall motion abnormalities. There is moderate left ventricular hypertrophy  of the basal-septal segment. Left  ventricular diastolic parameters are indeterminate.   2. Right ventricular systolic function is normal. The right ventricular  size is normal. There is mildly elevated pulmonary artery systolic  pressure.   3. Left atrial size was severely dilated.   4. Right atrial size was mildly  dilated.   5. The mitral valve is normal in structure. Mild mitral valve  regurgitation. No evidence of mitral stenosis.   6. 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 This likely accounts for atypical  location of aortc valve in LVOT. Prior echoes reviewed, valve appears  unchanged from prior. There is evidence of  turbulent flow and trivial perivalvular leak (location of native right  coronary cusp), adjacent to PA and likely prior site of aorta-PA fistula.  There is also mild central valve aortic regurgitation. The aortic valve  has been repaired/replaced. Aortic  valve regurgitation is mild. There is a 23 mm human allograft valve  present in the aortic position. Procedure Date: 05/15/20. Echo findings  are consistent with perivalvular leak of the aortic prosthesis.   7. Aortic root has repair at site of prior abscess/fistula. See comments  re: aortic valve replacement. Aortic root/ascending aorta has been  repaired/replaced.   8. The inferior vena cava is normal in size with greater than 50%  respiratory variability, suggesting right atrial pressure of 3 mmHg.   Comparison(s): Prior images reviewed side by side. See comments re: aortic  valve; atypical placement given history of abscess/fistula in aortic root.   Conclusion(s)/Recommendation(s): In parasternal short axis, there is  significant color doppler flow in the aortic root/sinus of Valsalva on the  [place] location. This color signal is seen in PA as well. In some views it  does not appear to travel between  aorta and PA, but in other views the strong color flow is concerning for  communication. Would consider TEE to evaluate aortic root further and  exclude aorta to PA residual shunting. Findings communicated to ordering  provider and primary cardiologist.   FINDINGS   Left Ventricle: Left ventricular ejection fraction, by estimation, is 60  to 65%. The left  ventricle has normal function. The left ventricle has no  regional wall motion abnormalities. The left ventricular internal cavity  size was normal in size. There is   moderate left ventricular hypertrophy of the basal-septal segment. Left  ventricular diastolic parameters are indeterminate.   Right Ventricle: The right ventricular size is normal. No increase in  right ventricular wall thickness. Right ventricular systolic function is  normal. There is mildly elevated pulmonary artery systolic pressure. The  tricuspid regurgitant velocity is 2.89   m/s, and with an assumed right atrial pressure of 3 mmHg, the estimated  right ventricular systolic pressure  is 36.4 mmHg.   Left Atrium: Left atrial size was severely dilated.   Right Atrium: Right atrial size was mildly dilated.   Pericardium: There is no evidence of pericardial effusion.   Mitral Valve: The mitral valve is normal in structure. Mild mitral valve  regurgitation. No evidence of mitral valve stenosis.   Tricuspid Valve: The tricuspid valve is normal in structure. Tricuspid  valve regurgitation is trivial. No evidence of tricuspid stenosis.   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 This likely accounts for  atypical location of aortc valve in LVOT. Prior echoes reviewed, valve  appears unchanged from prior. There is  evidence of turbulent flow and trivial perivalvular leak (location of  native right coronary cusp), adjacent to PA and likely prior site of  aorta-PA fistula. There is also mild central valve aortic regurgitation.  The aortic valve has been  repaired/replaced. Aortic valve regurgitation is mild. Aortic  regurgitation PHT measures 469 msec. Aortic valve mean gradient measures  7.0 mmHg. Aortic valve peak gradient measures 14.1 mmHg. Aortic valve  area, by VTI measures 2.05 cm. There is a 23 mm  human allograft valve present in the  aortic position. Procedure Date:  05/15/20.   Pulmonic Valve: The pulmonic valve was not well visualized. Pulmonic valve  regurgitation is not visualized.   Aorta: Aortic root has repair at site of prior abscess/fistula. See  comments re: aortic valve replacement. The aortic root/ascending aorta has  been repaired/replaced and the aortic arch was not well visualized.   Venous: The inferior vena cava is normal in size with greater than 50%  respiratory variability, suggesting right atrial pressure of 3 mmHg.   IAS/Shunts: The atrial septum is grossly normal.    CCTA  FINDINGS: Image quality: Excellent.   Noise artifact is: Limited.   Ascending aorta: 37 mm   Aortic root/valve: A 23 mm human aortic graft is present. The root is normal size (NCC 31 mm; RCC 30 mm; LCC 34 mm). There is evidence of extensive aortic root abscess that extends into the LVOT. There is a large pseudoaneurysm under the RCC/LCC commissure in the LVOT. It measures 35 mm x 7.5 mm x 25 mm. 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 The leaflets or the prosthesis open well without restriction. There is thickening at the base of the RCC/LCC leaflets which may represent vegetation. Findings are consistent with endocarditis of the aortic graft.   Coronary Arteries:  Normal coronary origin.  Right dominance.   Left main: The left main is a large caliber vessel with a normal take off from the left coronary cusp that bifurcates to form a left anterior descending artery and a left circumflex artery. There is mild calcified plaque (25-49%).   Left anterior descending artery: The proximal LAD contains minimal calcified plaque (<25%). The mid contains mild mixed density plaque (25-49%). The distal LAD is patent. D1 contains minimal mixed density plaque (<25%). D2 contains mild mixed density plaque (25-49%). D3  contains minimal mixed density plaque (<25%).   Left circumflex artery: The LCX is non-dominant. The proximal LCX contains minimal calcified plaque (<25%). The distal LCX contains mild calcified plaque (25-49%). OM1 is patent. OM2 contains minimal mixed density plaque (<25%). OM3 is patent.   Right coronary artery: The RCA is dominant with normal take off from the right coronary cusp. The  proximal RCA contains minimal mixed density plaque (<25%). The mid RCA contains moderate calcified plaque (50-69%). The distal RCA contains mild calcified plaque (25-49%). The RCA terminates as a PDA and right posterolateral branch without evidence of plaque or stenosis.   Right Atrium: Right atrial size is within normal limits.   Right Ventricle: The right ventricular cavity is within normal limits.   Left Atrium: Left atrial size is normal in size with no left atrial appendage filling defect.   Left Ventricle: The ventricular cavity size is within normal limits.   Pulmonary arteries: Mildly dilated suggestive of pulmonary hypertension.   Pulmonary veins: Normal pulmonary venous drainage.   Pericardium: Normal thickness without significant effusion or calcium present.   Cardiac valves: The aortic valve is trileaflet without significant calcification. The mitral valve is normal without significant calcification.   Aorta: Normal caliber without significant disease.   Extra-cardiac findings: See attached radiology report for non-cardiac structures.   IMPRESSION: 1. Coronary calcium score of 1915. This was 97th percentile for age-, sex, and race-matched controls. Plaque volume 1254 mm3.   2. Normal coronary origin with right dominance.   3. Mild calcified plaque (25-49%) in the left main, LAD, and LCX.   4. Moderate calcified plaque in the mid RCA (50-69%).   5. 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 Thickening at the base of the RCC/LCC leaflets which may represent vegetation. 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   6. Dilated pulmonary artery suggestive of pulmonary hypertension.   RECOMMENDATIONS: 1. Moderate stenosis in the mid RCA (50-69%). Consider symptom-guided anti-ischemic pharmacotherapy as well as risk factor modification per guideline directed care. Additional analysis with CT FFR will be submitted.   Eleonore Chiquito, MD   Electronically Signed   By: Eleonore Chiquito M.D.   On: 02/25/2022 21:52  Patient Profile     76 y.o. male history of aortic valve replacement who presents with aortic valve endocarditis   Assessment & Plan    Aortic Valve endocarditis -unfortunately patient did not get the TEE yesterday.  Plan for tomorrow.  He is aware of this. Further recommendation post TEE.  Nonobstructive coronary artery disease-no angina.  FFR negative continue aspirin and statin.  Heart failure preserved ejection fraction-no apparent fluid overload continue torsemide and Aldactone.  Hypertension-blood pressure at target.  Hyperlipidemia-continue current statin dose.  Hypothyroidism-continue Synthroid.  Metastatic prostate cancer-continue home regimen with Zytiga and prednisone   For questions or updates, please contact [place] Please consult [email or web address] for contact info under Cardiology/STEMI.      Signed, Trella Thurmond, DO  03/02/2022, 8:29 AM

## 2022-03-02 NOTE — Progress Notes (Signed)
Pharmacy Antibiotic Note  Matthew Serrano. is a 76 y.o. male admitted on 02/26/2022 with aortic valve endocarditis.  Pharmacy has been consulted for daptomycin dosing for empiric treatment. Baseline CK and WBC  WNL, afebrile. Patient is also on ceftriaxone 2g IV q24. For TEE on 7/12 -SCr= 1.07, CrCl ~ 65  Plan: Continue daptomycin 700 mg IV q24 (8 mg/kg q24) Monitor weekly CK  Weight: 89.4 kg (197 lb 1.6 oz)  Temp (24hrs), Avg:98.7 F (37.1 C), Min:97.9 F (36.6 C), Max:99.4 F (37.4 C)  Recent Labs  Lab 02/26/22 1816 03/01/22 1133  WBC 5.3 4.8  CREATININE 1.39* 1.07     Estimated Creatinine Clearance: 65.8 mL/min (by C-G formula based on SCr of 1.07 mg/dL).    No Known Allergies  Antimicrobials this admission: Daptomycin 7/8 >>  CTX 7/8 >>  Dose adjustments this admission: N/A  Microbiology results: 7/7 BCx: NGTD  Thank you for allowing pharmacy to be a part of this patient's care.  Hildred Laser, PharmD Clinical Pharmacist **Pharmacist phone directory can now be found on [email or web address] ([email or web address]).  Listed under [place].

## 2022-03-03 ENCOUNTER — Encounter (HOSPITAL_COMMUNITY): Payer: Self-pay | Admitting: Cardiovascular Disease

## 2022-03-03 ENCOUNTER — Encounter (HOSPITAL_COMMUNITY)
Admission: AD | Disposition: A | Discharge status: Home / Self Care | Payer: Self-pay | Source: Ambulatory Visit | Attending: Cardiovascular Disease

## 2022-03-03 ENCOUNTER — Inpatient Hospital Stay (HOSPITAL_COMMUNITY): Payer: Medicare PPO

## 2022-03-03 ENCOUNTER — Inpatient Hospital Stay (HOSPITAL_COMMUNITY): Payer: Medicare PPO | Admitting: Anesthesiology

## 2022-03-03 DIAGNOSIS — I351 Nonrheumatic aortic (valve) insufficiency: Secondary | ICD-10-CM

## 2022-03-03 DIAGNOSIS — I509 Heart failure, unspecified: Secondary | ICD-10-CM

## 2022-03-03 DIAGNOSIS — I110 Hypertensive heart disease with heart failure: Secondary | ICD-10-CM | POA: Diagnosis not present

## 2022-03-03 DIAGNOSIS — Z87891 Personal history of nicotine dependence: Secondary | ICD-10-CM

## 2022-03-03 DIAGNOSIS — I081 Rheumatic disorders of both mitral and tricuspid valves: Secondary | ICD-10-CM | POA: Diagnosis not present

## 2022-03-03 DIAGNOSIS — I2510 Atherosclerotic heart disease of native coronary artery without angina pectoris: Secondary | ICD-10-CM

## 2022-03-03 DIAGNOSIS — I330 Acute and subacute infective endocarditis: Secondary | ICD-10-CM | POA: Diagnosis not present

## 2022-03-03 HISTORY — PX: BUBBLE STUDY: SHX6837

## 2022-03-03 HISTORY — PX: TEE WITHOUT CARDIOVERSION: SHX5443

## 2022-03-03 LAB — BASIC METABOLIC PANEL
Anion gap: 6 (ref 5–15)
BUN: 22 mg/dL (ref 8–23)
CO2: 26 mmol/L (ref 22–32)
Calcium: 7.9 mg/dL — ABNORMAL LOW (ref 8.9–10.3)
Chloride: 111 mmol/L (ref 98–111)
Creatinine, Ser: 1.51 mg/dL — ABNORMAL HIGH (ref 0.61–1.24)
GFR, Estimated: 48 mL/min — ABNORMAL LOW (ref >60)
Glucose, Bld: 124 mg/dL — ABNORMAL HIGH (ref 70–99)
Potassium: 3.9 mmol/L (ref 3.5–5.1)
Sodium: 143 mmol/L (ref 135–145)

## 2022-03-03 LAB — ECHO TEE
AV Mean grad: 2 mmHg
AV Peak grad: 4.9 mmHg
Ao pk vel: 1.11 m/s

## 2022-03-03 LAB — GLUCOSE, CAPILLARY
Glucose-Capillary: 138 mg/dL — ABNORMAL HIGH (ref 70–99)
Glucose-Capillary: 250 mg/dL — ABNORMAL HIGH (ref 70–99)
Glucose-Capillary: 149 mg/dL — ABNORMAL HIGH (ref 70–99)
Glucose-Capillary: 140 mg/dL — ABNORMAL HIGH (ref 70–99)
Glucose-Capillary: 142 mg/dL — ABNORMAL HIGH (ref 70–99)
Glucose-Capillary: 169 mg/dL — ABNORMAL HIGH (ref 70–99)

## 2022-03-03 LAB — CULTURE, BLOOD (ROUTINE X 2)
Culture: NO GROWTH
Culture: NO GROWTH
Special Requests: ADEQUATE
Special Requests: ADEQUATE

## 2022-03-03 SURGERY — ECHOCARDIOGRAM, TRANSESOPHAGEAL
Anesthesia: Monitor Anesthesia Care

## 2022-03-03 MED ORDER — PROPOFOL 10 MG/ML IV BOLUS
INTRAVENOUS | Status: DC | PRN
  Administered 2022-03-03: 60 mg via INTRAVENOUS

## 2022-03-03 MED ORDER — PHENYLEPHRINE 80 MCG/ML (10ML) SYRINGE FOR IV PUSH (FOR BLOOD PRESSURE SUPPORT)
PREFILLED_SYRINGE | INTRAVENOUS | Status: DC | PRN
  Administered 2022-03-03: 80 ug via INTRAVENOUS

## 2022-03-03 MED ORDER — BUTAMBEN-TETRACAINE-BENZOCAINE 2-2-14 % EX AERO
INHALATION_SPRAY | CUTANEOUS | Status: DC | PRN
  Administered 2022-03-03: 2 via TOPICAL

## 2022-03-03 MED ORDER — LIDOCAINE 2% (20 MG/ML) 5 ML SYRINGE
INTRAMUSCULAR | Status: DC | PRN
  Administered 2022-03-03: 100 mg via INTRAVENOUS

## 2022-03-03 MED ORDER — PROPOFOL 500 MG/50ML IV EMUL
INTRAVENOUS | Status: DC | PRN
  Administered 2022-03-03: 125 ug/kg/min via INTRAVENOUS

## 2022-03-03 NOTE — Transfer of Care (Signed)
Immediate Anesthesia Transfer of Care Note  Patient: Matthew Serrano.  Procedure(s) Performed: TRANSESOPHAGEAL ECHOCARDIOGRAM (TEE)  Patient Location: Endoscopy Unit  Anesthesia Type:MAC  Level of Consciousness: awake, alert  and oriented  Airway & Oxygen Therapy: Patient Spontanous Breathing and Patient connected to nasal cannula oxygen  Post-op Assessment: Report given to RN and Post -op Vital signs reviewed and stable  Post vital signs: Reviewed and stable  Last Vitals:  Vitals Value Taken Time  BP 150/69 03/03/22 1102  Temp 36.7 C 03/03/22 1102  Pulse 58 03/03/22 1103  Resp 22 03/03/22 1103  SpO2 96 % 03/03/22 1103  Vitals shown include unvalidated device data.  Last Pain:  Vitals:   03/03/22 1102  TempSrc: Temporal  PainSc: 0-No pain      Patients Stated Pain Goal: 0 (61/68/37 2902)  Complications: No notable events documented.

## 2022-03-03 NOTE — CV Procedure (Signed)
     Transesophageal Echocardiogram Note  Axil Copeman [id number] 03-Dec-1945  Procedure: Transesophageal Echocardiogram Indications: Aortic valve endocarditis  Procedure Details Consent: Obtained Time Out: Verified patient identification, verified procedure, site/side was marked, verified correct patient position, special equipment/implants available, Radiology Safety Procedures followed,  medications/allergies/relevent history reviewed, required imaging and test results available.  Performed  Medications: Propofol: '557mg'$   Left Ventrical:  LVEF 65-70%  Mitral Valve: Mild thickening. Trivial MR  Aortic Valve: The patient is s/p 46m human allograft aortic valve replacement. There is a pseudoaneurysm below the RCC/LCC with evidence of a persistent aorto-PA fistula visualized on multiple views. There is an echolucent space at the level of the RCC extending into the LVOT with color flow visualized into the space concerning for aortic root abscess. The prosthetic aortic valve leaflets demonstrate normal mobility with no valvular vegetations visualized. There is mild central and commissural AI.     Tricuspid Valve: Normal structure, trivial TR  Pulmonic Valve: Normal structure. Trivial PI  Left Atrium/ Left atrial appendage: Moderate LA enlargement. No evidence of LAA thrombus  Atrial septum: PFO present on color doppler and agitated saline study  Aorta: Grade III plaque   Complications: No apparent complications Patient did tolerate procedure well.  HFreada Bergeron MD 03/03/2022, 10:59 AM

## 2022-03-03 NOTE — Discharge Summary (Signed)
Discharge Summary    Patient ID: Matthew Serrano. MRN: [id number]; DOB: 03/04/1946  Admit date: 02/26/2022 Discharge date: 03/04/2022  PCP:  [name], NP   [facility] Providers Cardiologist:  [name], Serrano  Cardiology APP:  Liliane Shi, PA-C     Discharge Diagnoses    Active Problems:   Hyperlipidemia LDL goal <70   Essential hypertension   Aortic valve disease s/p Bioprosthetic AVR, Ao Root Replacment, Repair of Ao to PA fistula 04/2020   Other secondary pulmonary hypertension ([place])   Endocarditis    Diagnostic Studies/Procedures    Coronary CT 02/25/22 1. Coronary calcium score of 1915. This was 97th percentile for age-, sex, and race-matched controls. Plaque volume 1254 mm3.   2. Normal coronary origin with right dominance.   3. Mild calcified plaque (25-49%) in the left main, LAD, and LCX.   4. Moderate calcified plaque in the mid RCA (50-69%).   5. 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 Thickening at the base of the RCC/LCC leaflets which may represent vegetation. 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   6. Dilated pulmonary artery suggestive of pulmonary hypertension.   RECOMMENDATIONS: 1. Moderate stenosis in the mid RCA (50-69%). Consider symptom-guided anti-ischemic pharmacotherapy as well as risk factor modification per guideline directed care. Additional analysis with CT FFR will be submitted.  CT FFR  1.  CT FFR negative.  TEE 03/03/22 1. The patient is s/p 15m human allograft aortic root repair. There is a  pseudoaneurysm present below the level of the RCC and LCC cusps extending  into the LVOT. There is an echolucent space along the region of the RCC  extending into the LVOT with  rocking motion of the aortic valve prosthesis concerning for aortic root  abscess. There is persistent flow visualized from the  aortic root into the  main PA just above the level of the pulmonic valve consistent with a  persistent aorto-pulmonary fistula.  The aortc valve leaflets have normal mobility with no vegetations  visualized. There is mild central aortic regurgitation. The aortic valve  has been repaired/replaced.   2. Left ventricular ejection fraction, by estimation, is 65 to 70%. The  left ventricle has normal function.   3. Right ventricular systolic function is normal. The right ventricular  size is normal.   4. Left atrial size was moderately dilated. No left atrial/left atrial  appendage thrombus was detected.   5. The mitral valve is grossly normal. Trivial mitral valve  regurgitation. No evidence of mitral stenosis.   6. Aortic root/ascending aorta has been repaired/replaced. There is  Moderate (Grade III) plaque.   7. Evidence of atrial level shunting detected by color flow Doppler.  Agitated saline contrast bubble study was positive with shunting observed  within 3-6 cardiac cycles suggestive of interatrial shunt.   _____________   History of Present Illness     Matthew Serrano is a 76y.o. male with a history of moderate non-obstructive CAD noted on cardiac catheterization in 01/2020 and on recent coronary CTA on 02/25/2022, severe aortic stenosis and endocarditis with subsequent AI s/p AVR with aortic root replacement and repair of aorta to pulmonary fistula in 04/2020, HFpEF, pulmonary hypertension, hypertension, hyperlipidemia, type 2 diabetes mellitus, hypothyroidism, lower GI bleed in 01/2020 secondary to telangiectasis treated with APC, and metastatic prostate cancer on suppression who who was directed admitted from home on 7/7  for further management of endocarditis.  Matthew Serrano is followed by Dr. Burt Knack. Patient was seen by Matthew Dopp, PA-C, on 01/26/2022 and  reported some chest discomfort. His symptoms sounded non-cardiac but he did have some changes on his EKG. Nuclear stress test was  ordered and showed no evidence of ischemia. Repeat Echo was ordered as well due to a prominent murmur and showed LVEF of 60-65% with no regional wall motion abnormalities, moderate LVH, and atypical location of the aortic valve and LVOT as well as evidence of turbulent flow and trivial perivalvular leak with mild central vale aortic regurgitation. He was seen again by Matthew Dopp, PA-C on 02/19/2022 for follow-up at which time he denied any exertional chest discomfort but noted some discomfort with swallowing and dysphagia. He had to vomit in in the past to get food out. He had no dysphagia with liquids. He also reported a lot of gas and indigestion. His Echo was discussed with Dr. Margaretann Serrano who reviewed the study extensively and recommend coronary CTA for valve assessment with TAVR protocol as soon as possible. 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 Therefore, he was advised to come to [facility] for direct admission for further management of this.   Patient was seen by cardiology upon arrival to the floor. At that time, he was resting comfortably in no acute distress.  He denied any significant change since he last saw Matthew Serrano a week prior.  He denied any real chest pain.  He reported occasional chest discomfort when swallowing. He did have some dyspnea on exertion with activities like going up the steps which is new for him.  No shortness of breath at rest.  No orthopnea or PND.  He had chronic lower extremity edema (worse on the left) which was somewhat worse than usual.  He denied any palpitations.  He had some lightheadedness/dizziness with quick  position changes but no syncope.  He denied any recent fevers, cough, nasal congestion.  He had intermittent vomiting which he thinks may is due to timing of his Metformin but none recently. He had one episode of "light blood" noted on his toilet paper after a bowel movement a couple weeks ago.  He stated he was constipated at the time. No other abnormal bleeding in urine or stools.  Hospital Course     Consultants: ID, CT surgery   Aortic Valve Endocarditis  S/P AVR 2021  - Echocardiogram on 6/21 showed atypical location of the aortic valve and LVOT. Also showed evidence of turbulent flow and trivial perivalvular leak with mild central valve aortic regurgitation - Follow up coronary CT showed moderate CAD (FFR negative) and extensive aortic root abscess with pseudoaneurysm of the LVOT as well as a aorta-pulmonary fistula with left to right shunting and thickening at the base of the RCC/ LCC leaflets which may represent vegetation - Patient was seen by ID while admitted-- treated with ceftriaxone and daptomycin. Blood cultures from 7/7 showed no growth at 5 days  - Also seen by CT surgery-- per their notes, they are concerned that patient would be an extremely high risk patient. Unsure if there would be a way to recontsturct  the LV outflow tract. Believe that operative correction would be difficult, possibly undoable - TEE on 7/12 showed the patient was s/p 23 mm human allograft aortic root repair. There was a pseudoaneurysm present below the level of the RCC and LCC cusps extending into the LVOT. There was also an echolucent space along the region of the RCC wxtending into the LVOT with rocking motion of the aortic valve prosthesis concering for aortic root abscess. Also noted persistent flow from the aortic root into the main PA just above the level of the pulmonic valve consistent with a persistent aorto-pulmonary fistula  - Patient had a PICC line placed on 7/13 for IV antibiotics  - Discharging on  IV ceftriaxone 2 gram daily until 04/09/22. Home health has been arranged  - Patient will follow up with Dr. Burt Knack within the next 2 weeks. I have messaged our office staff to arrange and instructed the patient to contact us if he ist not contacted within the next 5 days.   Nonobstructive CAD  - No angina  - Coronary CT FFR negative  - Continue ASA, lipitor, labetalol   HFpEF  - Recent Echo with EF 60-65%, moderate LVH  - Patient euvolemic on exam  - Continue torsemide and aldactone   HLD - Continue lipitor   Hypothyroidism  - Continue synthroid   Metastatic Prostate Cancer  - Continue home regiment    Patient was seen and examined by Dr. Harriet Serrano and deemed stable for discharge.   I have messaged our office to arrange a follow up appointment with Dr. Burt Knack in the next 2-3 weeks.   Did the patient have an acute coronary syndrome (MI, NSTEMI, STEMI, etc) this admission?:  No                               Did the patient have a percutaneous coronary intervention (stent / angioplasty)?:  No.          _____________  Discharge Vitals Blood pressure 125/72, pulse (!) 52, temperature 98.5 F (36.9 C), temperature source Oral, resp. rate 16, weight 90.7 kg, SpO2 100 %.  Filed Weights   03/02/22 0600 03/03/22 0600 03/04/22 0506  Weight: 89.4 kg 90.7 kg 90.7 kg    Labs & Radiologic Studies    CBC No results for input(s): "WBC", "NEUTROABS", "HGB", "HCT", "MCV", "PLT" in the last 72 hours.  Basic Metabolic Panel Recent Labs    03/03/22 0305  NA 143  K 3.9  CL 111  CO2 26  GLUCOSE 124*  BUN 22  CREATININE 1.51*  CALCIUM 7.9*   Liver Function Tests No results for input(s): "AST", "ALT", "ALKPHOS", "BILITOT", "PROT", "ALBUMIN" in the last 72 hours. No results for input(s): "LIPASE", "AMYLASE" in the last 72 hours. High Sensitivity Troponin:   No results for input(s): "TROPONINIHS" in the last 720 hours.  BNP Invalid input(s): "POCBNP" D-Dimer No results for  input(s): "DDIMER" in the last 72 hours. Hemoglobin A1C No results for input(s): "HGBA1C" in the last 72 hours. Fasting Lipid Panel No results for input(s): "CHOL", "HDL", "LDLCALC", "TRIG", "CHOLHDL", "LDLDIRECT" in the last 72 hours. Thyroid Function Tests No results for input(s): "TSH", "T4TOTAL", "T3FREE", "THYROIDAB" in the last 72 hours.  Invalid input(s): "FREET3" _____________  [place] EKG SITE RITE  Result Date: 03/04/2022 If Site Rite image not attached, placement could not be confirmed due to current cardiac rhythm.  ECHO TEE  Result Date: 03/03/2022  TRANSESOPHOGEAL ECHO REPORT   Patient Name:   Matthew Serrano. Date of Exam: 03/03/2022 Medical Rec #:  [id number]          Height:       68.0 in Accession #:    [id number]         Weight:       200.0 lb Date of Birth:  [birth year]         BSA:          2.044 m Patient Age:    76 years           BP:           177/85 mmHg Patient Gender: M                  HR:           53 bpm. Exam Location:  Inpatient Procedure: 3D Echo, Transesophageal Echo, Cardiac Doppler and Color Doppler Indications:     Endocarditis  History:         Patient has prior history of Echocardiogram examinations, most                  recent 02/10/2022. CAD, Pulmonary HTN, Aortic Valve Disease,                  Signs/Symptoms:Chest Pain; Risk Factors:Hypertension and                  Diabetes. ETOH. Aortic/pulmonary fistula.  Sonographer:     Matthew Serrano RDCS Referring Phys:  [id number] Matthew Serrano Diagnosing Phys: Matthew Kaufman Serrano  Sonographer Comments: Technically difficult study due to poor echo windows. PROCEDURE: After discussion of the risks and benefits of a TEE, an informed consent was obtained from the patient. The transesophogeal probe was passed without difficulty through the esophogus of the patient. Imaged were obtained with the patient in a left lateral decubitus position. Sedation performed by different physician. The patient was monitored while under deep  sedation. Anesthestetic sedation was provided intravenously by Anesthesiology: 54m of Propofol. The patient's vital signs; including heart rate, blood pressure, and oxygen saturation; remained stable throughout the procedure. Supplementary images were obtained from transthoracic windows as indicated to answer the clinical question. The patient developed no complications during the procedure. IMPRESSIONS  1. The patient is s/p 258mhuman allograft aortic root repair. 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 There is persistent flow visualized from the aortic root into the main PA just above the level of the pulmonic valve consistent with a persistent aorto-pulmonary fistula. The aortc valve leaflets have normal mobility with no vegetations visualized. There is mild central aortic regurgitation. The aortic valve has been repaired/replaced.  2. Left ventricular ejection fraction, by estimation, is 65 to 70%. The left ventricle has normal function.  3. Right ventricular systolic function is normal. The right ventricular size is normal.  4. Left atrial size was moderately dilated. No left atrial/left atrial appendage thrombus was detected.  5. The mitral valve is grossly normal. Trivial mitral valve regurgitation. No evidence of mitral stenosis.  6. Aortic root/ascending aorta has been repaired/replaced. There is Moderate (Grade III) plaque.  7. Evidence of atrial level shunting detected by color flow Doppler. Agitated saline contrast bubble  study was positive with shunting observed within 3-6 cardiac cycles suggestive of interatrial shunt. FINDINGS  Left Ventricle: Left ventricular ejection fraction, by estimation, is 65 to 70%. The left ventricle has normal function. The left ventricular  internal cavity size was normal in size. Right Ventricle: The right ventricular size is normal. No increase in right ventricular wall thickness. Right ventricular systolic function is normal. Left Atrium: Left atrial size was moderately dilated. No left atrial/left atrial appendage thrombus was detected. Right Atrium: Right atrial size was normal in size. Pericardium: There is no evidence of pericardial effusion. Mitral Valve: The mitral valve is grossly normal. There is mild thickening of the mitral valve leaflet(s). There is mild calcification of the mitral valve leaflet(s). Trivial mitral valve regurgitation. No evidence of mitral valve stenosis. Tricuspid Valve: The tricuspid valve is normal in structure. Tricuspid valve regurgitation is trivial. Aortic Valve: The patient is s/p 65m human allograft aortic root repair. 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 There is persistent flow visualized from the aortic root into the main PA just above the level of the pulmonic valve consistent with a persistent aorto-pulmonary fistula. The aortc valve leaflets have normal mobility with no vegetations visualized. There is mild central aortic regurgitation. The aortic valve has been repaired/replaced. Aortic valve mean gradient measures 2.0 mmHg. Aortic valve peak gradient measures 4.9 mmHg. Pulmonic Valve: The pulmonic valve was normal in structure. Pulmonic valve regurgitation is trivial. Aorta: The aortic root/ascending aorta has been repaired/replaced. There is moderate (Grade III) plaque. IAS/Shunts: The interatrial septum is aneurysmal. Evidence of atrial level shunting detected by color flow Doppler. Agitated saline contrast bubble study was positive with shunting observed within 3-6 cardiac  cycles suggestive of interatrial shunt.  LEFT VENTRICLE PLAX 2D LVOT diam:     2.10 cm LVOT Area:     3.46 cm  AORTIC VALVE AV Vmax:      111.00 cm/s AV Vmean:     67.700 cm/s AV VTI:       0.230 m AV Peak Grad: 4.9 mmHg AV Mean Grad: 2.0 mmHg  SHUNTS Systemic Diam: 2.10 cm HGwyndolyn KaufmanMD Electronically signed by HGwyndolyn KaufmanMD Signature Date/Time: 03/03/2022/4:49:03 PM    Final (Updated)    CT CORONARY FRACTIONAL FLOW RESERVE DATA PREP  Result Date: 02/25/2022 EXAM: CT FFR analysis was performed on the original cardiac CTA dataset. Diagrammatic representation of the CT FFR analysis is provided in a separate PDF document in PACS. This dictation was created using the PDF document and an interactive 3D model of the results. The 3D model is not available in the EMR/PACS. INTERPRETATION: CT FFR provides simultaneous calculation of pressure and flow across the entire coronary tree. For clinical decision making, CT FFR values should be obtained 1-2 cm distal to the lower border of each stenosis measured. Coronary CTA-related artifacts may impair the diagnostic accuracy of the original cardiac CTA and FFR CT results. *Due to the fact that CT FFR represents a mathematically-derived analysis, it is recommended that the results be interpreted as follows: 1. CT FFR >0.80: Low likelihood of hemodynamic significance. 2. CT FFR 0.76-0.80: Borderline likelihood of hemodynamic significance. 3. CT FFR =< 0.75: High likelihood of hemodynamic significance. *Coronary CT Angiography-derived Fractional Flow Reserve Testing in Patients with Stable  Coronary Artery Disease: Recommendations on Interpretation and Reporting. Radiology: Cardiothoracic Imaging. 2019;1(5):[id number] FINDINGS: 1. Left Main: 0.99; low likelihood of hemodynamic significance. 2. Prox LAD: 0.98; low likelihood of hemodynamic significance. 3. Mid LAD: 0.95; low likelihood of hemodynamic significance. 4. Distal LAD: 0.83; low likelihood of hemodynamic  significance. 5. D2: 0.84; low likelihood of hemodynamic significance. 6. LCX: 0.99; low likelihood of hemodynamic significance. 7. Prox RCA: 0.96; low likelihood of hemodynamic significance. 8. Mid RCA: 0.94; low likelihood of hemodynamic significance. 9. Distal RCA: 0.94; low likelihood of hemodynamic significance. IMPRESSION: 1.  CT FFR negative. Matthew Chiquito, Serrano Electronically Signed   By: Matthew Serrano M.D.   On: 02/25/2022 21:57   CT CORONARY MORPH W/CTA COR W/SCORE W/CA W/CM &/OR WO/CM  Addendum Date: 02/25/2022   ADDENDUM REPORT: 02/25/2022 21:52 CLINICAL DATA:  Prosthetic valve dysfunction. History of AoV endocarditis/aortic root abscess. S/p 23 mm [employer] human aortic graft with coronary reimplantation and 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 05/15/2020. EXAM: Cardiac/Coronary CTA TECHNIQUE: A non-contrast, gated CT scan was obtained with axial slices of 3 mm through the heart for calcium scoring. Calcium scoring was performed using the Agatston method. A 120 kV retrospective, gated, contrast cardiac scan was obtained. Gantry rotation speed was 250 msecs and collimation was 0.6 mm. Two sublingual nitroglycerin tablets (0.8 mg) were given. The 3D data set was reconstructed in 5% intervals of the 0-95% of the R-R cycle. Diastolic phases were analyzed on a dedicated workstation using MPR, MIP, and VRT modes. The patient received 95 cc of contrast. FINDINGS: Image quality: Excellent. Noise artifact is: Limited. Ascending aorta: 37 mm Aortic root/valve: A 23 mm human aortic graft is present. The root is normal size (NCC 31 mm; RCC 30 mm; LCC 34 mm). There is evidence of extensive aortic root abscess that extends into the LVOT. There is a large pseudoaneurysm under the RCC/LCC commissure in the LVOT. It measures 35 mm x 7.5 mm x 25 mm. 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 The leaflets or the  prosthesis open well without restriction. There is thickening at the base of the RCC/LCC leaflets which may represent vegetation. Findings are consistent with endocarditis of the aortic graft. Coronary Arteries:  Normal coronary origin.  Right dominance. Left main: The left main is a large caliber vessel with a normal take off from the left coronary cusp that bifurcates to form a left anterior descending artery and a left circumflex artery. There is mild calcified plaque (25-49%). Left anterior descending artery: The proximal LAD contains minimal calcified plaque (<25%). The mid contains mild mixed density plaque (25-49%). The distal LAD is patent. D1 contains minimal mixed density plaque (<25%). D2 contains mild mixed density plaque (25-49%). D3 contains minimal mixed density plaque (<25%). Left circumflex artery: The LCX is non-dominant. The proximal LCX contains minimal calcified plaque (<25%). The distal LCX contains mild calcified plaque (25-49%). OM1 is patent. OM2 contains minimal mixed density plaque (<25%). OM3 is patent. Right coronary artery: The RCA is dominant with normal take off from the right coronary cusp. The proximal RCA contains minimal mixed density plaque (<25%). The mid RCA contains moderate calcified plaque (50-69%). The distal RCA contains mild calcified plaque (25-49%). The RCA terminates as a PDA and right posterolateral branch without evidence of plaque or stenosis. Right Atrium: Right atrial size is within normal limits. Right Ventricle: The right ventricular cavity is within normal limits. Left  Atrium: Left atrial size is normal in size with no left atrial appendage filling defect. Left Ventricle: The ventricular cavity size is within normal limits. Pulmonary arteries: Mildly dilated suggestive of pulmonary hypertension. Pulmonary veins: Normal pulmonary venous drainage. Pericardium: Normal thickness without significant effusion or calcium present. Cardiac valves: The aortic valve is  trileaflet without significant calcification. The mitral valve is normal without significant calcification. Aorta: Normal caliber without significant disease. Extra-cardiac findings: See attached radiology report for non-cardiac structures. IMPRESSION: 1. Coronary calcium score of 1915. This was 97th percentile for age-, sex, and race-matched controls. Plaque volume 1254 mm3. 2. Normal coronary origin with right dominance. 3. Mild calcified plaque (25-49%) in the left main, LAD, and LCX. 4. Moderate calcified plaque in the mid RCA (50-69%). 5. 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 Thickening at the base of the RCC/LCC leaflets which may represent vegetation. 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 6. Dilated pulmonary artery suggestive of pulmonary hypertension. RECOMMENDATIONS: 1. Moderate stenosis in the mid RCA (50-69%). Consider symptom-guided anti-ischemic pharmacotherapy as well as risk factor modification per guideline directed care. Additional analysis with CT FFR will be submitted. Matthew Chiquito, Serrano Electronically Signed   By: Matthew Serrano M.D.   On: 02/25/2022 21:52   Result Date: 02/25/2022 EXAM: OVER-READ INTERPRETATION  CT CHEST The following report is a limited chest CT over-read performed by radiologist Dr. Vinnie Serrano of [facility], [place] on 02/25/2022. The coronary calcium score and cardiac CTA interpretation by the cardiologist is attached. COMPARISON:  None Available. FINDINGS: Atherosclerotic calcifications in the thoracic aorta. Status post aortic root replacement. Small hiatal hernia. Within the visualized portions of the thorax there are no suspicious appearing pulmonary nodules or masses, there is no acute consolidative airspace disease, no pleural effusions, no pneumothorax and no lymphadenopathy. Visualized portions of the upper abdomen are unremarkable.  There are no aggressive appearing lytic or blastic lesions noted in the visualized portions of the skeleton. IMPRESSION: 1.  Aortic Atherosclerosis (ICD10-I70.0). Electronically Signed: By: Matthew Serrano M.D. On: 02/25/2022 08:35   MYOCARDIAL PERFUSION IMAGING  Result Date: 02/10/2022   The study is normal. The study is low risk.   No ST deviation was noted.   LV perfusion is normal. There is no evidence of ischemia. There is no evidence of infarction.   Left ventricular function is normal. Nuclear stress EF: 69 %. The left ventricular ejection fraction is hyperdynamic (>65%). End diastolic cavity size is mildly enlarged. End systolic cavity size is normal.   Prior study available for comparison from 03/25/2017.   ECHOCARDIOGRAM COMPLETE  Result Date: 02/10/2022    ECHOCARDIOGRAM REPORT   Patient Name:   Anselm Aumiller. Date of Exam: 02/10/2022 Medical Rec #:  [id number]          Height:       68.0 in Accession #:    [id number]         Weight:       201.0 lb Date of Birth:  [birth year]         BSA:          2.048 m Patient Age:    50 years           BP:           137/80 mmHg Patient Gender: M  HR:           58 bpm. Exam Location:  [facility] Procedure: 2D Echo, 3D Echo, Cardiac Doppler and Color Doppler Indications:    Z95.2 s/p AVR  History:        Patient has prior history of Echocardiogram examinations, most                 recent 12/24/2020. CHF, CAD, Signs/Symptoms:Chest Pain; Risk                 Factors:Hypertension, HLD and Diabetes. 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                  Aortic Valve: 23 mm human allograft valve is present in the                 aortic position.  Procedure Date: 05/15/20.  Sonographer:    Marygrace Drought RCS Referring Phys: [place]  1. Left ventricular ejection fraction, by estimation, is 60 to 65%. The left ventricle has normal function. The left ventricle has no regional wall motion abnormalities. There is moderate left ventricular hypertrophy of the basal-septal segment. Left ventricular diastolic parameters are indeterminate.  2. Right ventricular systolic function is normal. The right ventricular size is normal. There is mildly elevated pulmonary artery systolic pressure.  3. Left atrial size was severely dilated.  4. Right atrial size was mildly dilated.  5. The mitral valve is normal in structure. Mild mitral valve regurgitation. No evidence of mitral stenosis.  6. 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 This likely accounts for atypical location of aortc valve in LVOT. Prior echoes reviewed, valve appears unchanged from prior. There is evidence of turbulent flow and trivial perivalvular leak (location of native right coronary cusp), adjacent to PA and likely prior site of aorta-PA fistula. There is also mild central valve aortic regurgitation. The aortic valve has been repaired/replaced. Aortic valve regurgitation is mild. There is a 23 mm human allograft valve present in the aortic position. Procedure Date: 05/15/20. Echo findings are consistent with perivalvular leak of the aortic prosthesis.  7. Aortic root has repair at site of prior abscess/fistula. See comments re: aortic valve replacement. Aortic root/ascending aorta has been repaired/replaced.  8. The inferior vena cava is normal in size with greater than 50% respiratory variability, suggesting right atrial pressure of 3 mmHg. Comparison(s): Prior images reviewed side by side. See comments re: aortic valve; atypical placement given history of abscess/fistula in aortic root. Conclusion(s)/Recommendation(s): In  parasternal short axis, there is significant color doppler flow in the aortic root/sinus of  Valsalva on the [place] location. This color signal is seen in PA as well. In some views it does not appear to travel between aorta and PA, but in other views the strong color flow is concerning for communication. Would consider TEE to evaluate aortic root further and exclude aorta to PA residual shunting. Findings communicated to ordering provider and primary cardiologist. FINDINGS  Left Ventricle: Left ventricular ejection fraction, by estimation, is 60 to 65%. The left ventricle has normal function. The left ventricle has no regional wall motion abnormalities. The left ventricular internal cavity size was normal in size. There is  moderate left ventricular hypertrophy of the basal-septal segment. Left ventricular diastolic parameters are indeterminate. Right Ventricle: The right ventricular size is normal. No increase in right ventricular wall thickness. Right ventricular systolic function is normal. There is mildly elevated pulmonary artery systolic pressure. The tricuspid regurgitant velocity is 2.89  m/s, and with an assumed right atrial pressure of 3 mmHg, the estimated right ventricular systolic pressure is 34.1 mmHg. Left Atrium: Left atrial size was severely dilated. Right Atrium: Right atrial size was mildly dilated. Pericardium: There is no evidence of pericardial effusion. Mitral Valve: The mitral valve is normal in structure. Mild mitral valve regurgitation. No evidence of mitral valve stenosis. Tricuspid Valve: The tricuspid valve is normal in structure. Tricuspid valve regurgitation is trivial. No evidence of tricuspid stenosis. 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 This likely accounts for atypical location of aortc valve in LVOT. Prior echoes reviewed, valve appears unchanged from prior. There is evidence of turbulent flow and trivial  perivalvular leak (location of native right coronary cusp), adjacent to PA and likely prior site of aorta-PA fistula. There is also mild central valve aortic regurgitation. The aortic valve has been repaired/replaced. Aortic valve regurgitation is mild. Aortic regurgitation PHT measures 469 msec. Aortic valve mean gradient measures 7.0 mmHg. Aortic valve peak gradient measures 14.1 mmHg. Aortic valve area, by VTI measures 2.05 cm. There is a 23 mm human allograft valve present in the aortic position. Procedure Date: 05/15/20. Pulmonic Valve: The pulmonic valve was not well visualized. Pulmonic valve regurgitation is not visualized. Aorta: Aortic root has repair at site of prior abscess/fistula. See comments re: aortic valve replacement. The aortic root/ascending aorta has been repaired/replaced and the aortic arch was not well visualized. Venous: The inferior vena cava is normal in size with greater than 50% respiratory variability, suggesting right atrial pressure of 3 mmHg. IAS/Shunts: The atrial septum is grossly normal.  LEFT VENTRICLE PLAX 2D LVIDd:         5.00 cm   Diastology LVIDs:         3.40 cm   LV e' medial:    10.90 cm/s LV PW:         1.30 cm   LV E/e' medial:  9.4 LV IVS:        1.50 cm   LV e' lateral:   15.30 cm/s LVOT diam:     1.90 cm   LV E/e' lateral: 6.7 LV SV:         82 LV SV Index:   40 LVOT Area:     2.84 cm                           3D Volume EF:  3D EF:        70 %                          LV EDV:       170 ml                          LV ESV:       51 ml                          LV SV:        120 ml RIGHT VENTRICLE RV Basal diam:  3.60 cm RV S prime:     12.70 cm/s TAPSE (M-mode): 2.2 cm RVSP:           36.4 mmHg LEFT ATRIUM              Index        RIGHT ATRIUM           Index LA diam:        4.90 cm  2.39 cm/m   RA Pressure: 3.00 mmHg LA Vol (A2C):   115.0 ml 56.15 ml/m  RA Area:     18.40 cm LA Vol (A4C):   98.6 ml  48.14 ml/m  RA Volume:   50.90 ml   24.85 ml/m LA Biplane Vol: 108.0 ml 52.73 ml/m  AORTIC VALVE AV Area (Vmax):    1.52 cm AV Area (Vmean):   1.46 cm AV Area (VTI):     2.05 cm AV Vmax:           188.00 cm/s AV Vmean:          120.000 cm/s AV VTI:            0.399 m AV Peak Grad:      14.1 mmHg AV Mean Grad:      7.0 mmHg LVOT Vmax:         101.00 cm/s LVOT Vmean:        61.800 cm/s LVOT VTI:          0.288 m LVOT/AV VTI ratio: 0.72 AI PHT:            469 msec  AORTA Ao Root diam: 3.90 cm Ao Asc diam:  3.10 cm MITRAL VALVE                TRICUSPID VALVE MV Area (PHT):              TR Peak grad:   33.4 mmHg MV Decel Time:              TR Vmax:        289.00 cm/s MV E velocity: 102.00 cm/s  Estimated RAP:  3.00 mmHg MV A velocity: 85.40 cm/s   RVSP:           36.4 mmHg MV E/A ratio:  1.19                             SHUNTS                             Systemic VTI:  0.29 m  Systemic Diam: 1.90 cm Matthew Serrano Electronically signed by Matthew Serrano Signature Date/Time: 02/10/2022/1:07:16 PM    Final     Disposition   Pt is being discharged home today in good condition.  Follow-up Plans & Appointments     Follow-up Information     [facility] Follow up.   Specialty: Home Health Services Why: Home Health Registered Nurse-office to call with visit times. Contact information: [address] [place] [address] [phone]         [name], [facility] Follow up.   Specialty: Cardiology Why: Our office will call you to schedule an appointment. If you do not recieve a call within the next 5 days, please call our office at phone number listed. Contact information: [address] [place] [address] [phone]                Discharge Instructions     [facility] pharmacist to adjust dose for Vancomycin, Aminoglycosides and other anti-infective therapies as requested by physician.   Complete by: As directed     [facility] to provide Cath Flo 92m   Complete by: As directed    Administer for PICC line occlusion and as ordered by physician for other access device issues.   Anaphylaxis Kit: Provided to treat any anaphylactic reaction to the medication being provided to the patient if First Dose or when requested by physician   Complete by: As directed    Epinephrine 18mml vial / amp: Administer 0.34m61m0.34ml37mubcutaneously once for moderate to severe anaphylaxis, nurse to call physician and pharmacy when reaction occurs and call 911 if needed for immediate care   Diphenhydramine 50mg38mIV vial: Administer 25-50mg 234mM PRN for first dose reaction, rash, itching, mild reaction, nurse to call physician and pharmacy when reaction occurs   Sodium Chloride 0.9% NS 500ml I44mdminister if needed for hypovolemic blood pressure drop or as ordered by physician after call to physician with anaphylactic reaction   Call [facility] for:  hives   Complete by: As directed    Call Serrano for:  persistant dizziness or light-headedness   Complete by: As directed    Call Serrano for:  persistant nausea and vomiting   Complete by: As directed    Call Serrano for:  redness, tenderness, or signs of infection (pain, swelling, redness, odor or green/yellow discharge around incision site)   Complete by: As directed    Call Serrano for:  severe uncontrolled pain   Complete by: As directed    Call Serrano for:  temperature >100.4   Complete by: As directed    Change dressing on IV access line weekly and PRN   Complete by: As directed    Diet - low sodium heart healthy   Complete by: As directed    Discharge wound care:   Complete by: As directed    Follow instructions given to you by the IV team about how to prevent infection in your PICC line.   Flush IV access with Sodium Chloride 0.9% and Heparin 10 units/ml or 100 units/ml   Complete by: As directed    Home infusion instructions - [facility]   Complete by: As directed     Instructions: Flush IV access with Sodium Chloride 0.9% and Heparin 10units/ml or 100units/ml   Change dressing on IV access line: Weekly and PRN   Instructions Cath Flo 2mg: Ad66mister for PICC Line occlusion and as ordered  by physician for other access device   [facility] pharmacist to adjust dose for: Vancomycin, Aminoglycosides and other anti-infective therapies as requested by physician   Increase activity slowly   Complete by: As directed    Method of administration may be changed at the discretion of home infusion pharmacist based upon assessment of the patient and/or caregiver's ability to self-administer the medication ordered   Complete by: As directed        Discharge Medications   Allergies as of 03/04/2022   No Known Allergies      Medication List     STOP taking these medications    metoprolol tartrate 50 MG tablet Commonly known as: LOPRESSOR       TAKE these medications    abiraterone acetate 250 MG tablet Commonly known as: ZYTIGA TAKE 4 TABLETS (1,000 MG TOTAL) BY MOUTH DAILY. TAKE ON AN EMPTY STOMACH 1 HOUR BEFORE OR 2 HOURS AFTER A MEAL What changed:  how much to take additional instructions   aspirin EC 81 MG tablet Take 81 mg by mouth daily. Swallow whole.   atorvastatin 80 MG tablet Commonly known as: LIPITOR Take 1 tablet (80 mg total) by mouth daily.   CALCIUM 600 PO Take 1,200 mg by mouth daily.   cefTRIAXone  IVPB Commonly known as: ROCEPHIN Inject 2 g into the vein daily. Indication:  Possible aortic valve endocarditis - hx of strep  First Dose: Yes Last Day of Therapy: 04/09/22  Labs - Once weekly:  CBC/D and BMP, Labs - Every other week:  ESR and CRP Method of administration: IV Push Method of administration may be changed at the discretion of home infusion pharmacist based upon assessment of the patient and/or caregiver's ability to self-administer the medication ordered.   cholecalciferol 25 MCG (1000 UNIT)  tablet Commonly known as: VITAMIN D3 Take 1,000 Units by mouth daily.   doxazosin 8 MG tablet Commonly known as: CARDURA TAKE 1 TABLET AT BEDTIME   ferrous sulfate 325 (65 FE) MG EC tablet Take 1 tablet (325 mg total) by mouth 2 (two) times daily before a meal.   glipiZIDE 10 MG 24 hr tablet Commonly known as: GLUCOTROL XL TAKE 1 TABLET TWICE DAILY What changed:  how much to take how to take this when to take this   labetalol 200 MG tablet Commonly known as: NORMODYNE TAKE 3 TABLETS EVERY MORNING AND TAKE 2 TABLETS EVERY EVENING What changed: See the new instructions.   levothyroxine 125 MCG tablet Commonly known as: SYNTHROID Take 1 tablet (125 mcg total) by mouth daily.   metFORMIN 1000 MG tablet Commonly known as: GLUCOPHAGE TAKE 1 TABLET TWICE DAILY WITH MEALS (NEED [facility] APPOINTMENT FOR REFILLS) What changed: See the new instructions.   omeprazole 40 MG capsule Commonly known as: PRILOSEC Take 1 tablet by mouth twice a day X's 2 weeks then reduce back to 1 tablet by mouth daily What changed:  how much to take how to take this when to take this additional instructions   potassium chloride SA 20 MEQ tablet Commonly known as: KLOR-CON M Take 1 tablet (20 mEq total) by mouth daily.   predniSONE 5 MG tablet Commonly known as: DELTASONE TAKE 1 TABLET BY MOUTH ONCE A DAY WITH BREAKFAST What changed: how much to take   spironolactone 25 MG tablet Commonly known as: ALDACTONE Take 1 tablet (25 mg total) by mouth every other day.   torsemide 20 MG tablet Commonly known as: DEMADEX Take 1 tablet (20  mg total) by mouth daily.               Discharge Care Instructions  (From admission, onward)           Start     Ordered   03/04/22 0000  Change dressing on IV access line weekly and PRN  (Home infusion instructions - [facility] )        03/04/22 1402   03/04/22 0000  Discharge wound care:       Comments: Follow instructions given to you by  the IV team about how to prevent infection in your PICC line.   03/04/22 1552               Outstanding Labs/Studies     Duration of Discharge Encounter   Greater than 30 minutes including physician time.  Signed, Matthew Billet, PA-C 03/04/2022, 3:54 PM

## 2022-03-03 NOTE — Progress Notes (Signed)
  Echocardiogram 2D Echocardiogram has been performed.  Bobbye Charleston 03/03/2022, 11:31 AM

## 2022-03-03 NOTE — Anesthesia Postprocedure Evaluation (Signed)
Anesthesia Post Note  Patient: Abiel Antrim.  Procedure(s) Performed: TRANSESOPHAGEAL ECHOCARDIOGRAM (TEE)     Patient location during evaluation: Endoscopy Anesthesia Type: MAC Level of consciousness: awake and alert Pain management: pain level controlled Vital Signs Assessment: post-procedure vital signs reviewed and stable Respiratory status: spontaneous breathing, nonlabored ventilation, respiratory function stable and patient connected to nasal cannula oxygen Cardiovascular status: blood pressure returned to baseline and stable Postop Assessment: no apparent nausea or vomiting Anesthetic complications: no   No notable events documented.  Last Vitals:  Vitals:   03/03/22 1120 03/03/22 1148  BP: (!) 156/76 (!) 161/79  Pulse: (!) 54 61  Resp: 13 20  Temp:  36.5 C  SpO2: 98% 100%    Last Pain:  Vitals:   03/03/22 1148  TempSrc: Oral  PainSc:                  Costas Sena L Cherysh Epperly

## 2022-03-03 NOTE — Interval H&P Note (Signed)
History and Physical Interval Note:  03/03/2022 8:59 AM  Matthew Serrano.  has presented today for surgery, with the diagnosis of look at valves.  The various methods of treatment have been discussed with the patient and family. After consideration of risks, benefits and other options for treatment, the patient has consented to  Procedure(s): TRANSESOPHAGEAL ECHOCARDIOGRAM (TEE) (N/A) as a surgical intervention.  The patient's history has been reviewed, patient examined, no change in status, stable for surgery.  I have reviewed the patient's chart and labs.  Questions were answered to the patient's satisfaction.     [name]

## 2022-03-03 NOTE — Progress Notes (Addendum)
Progress Note  Patient Name: Matthew Serrano. Date of Encounter: 03/03/2022  Primary Cardiologist: [name], MD   Subjective   Patient seen examined at his bedside.  TEE today.  Inpatient Medications    Scheduled Meds:  abiraterone acetate  1,000 mg Oral QAC breakfast   aspirin EC  81 mg Oral Daily   atorvastatin  80 mg Oral Daily   doxazosin  8 mg Oral QHS   enoxaparin (LOVENOX) injection  40 mg Subcutaneous Q24H   ferrous sulfate  325 mg Oral Daily   insulin aspart  0-15 Units Subcutaneous TID WC   labetalol  400 mg Oral QHS   labetalol  600 mg Oral Daily   levothyroxine  125 mcg Oral Daily   pantoprazole  40 mg Oral Daily   potassium chloride SA  20 mEq Oral Daily   predniSONE  5 mg Oral Q breakfast   spironolactone  25 mg Oral QODAY   torsemide  20 mg Oral Daily   Continuous Infusions:  cefTRIAXone (ROCEPHIN)  IV 2 g (03/02/22 1318)   DAPTOmycin (CUBICIN) 700 mg in sodium chloride 0.9 % IVPB 700 mg (03/02/22 2052)   PRN Meds: acetaminophen, nitroGLYCERIN, ondansetron (ZOFRAN) IV   Vital Signs    Vitals:   03/03/22 1102 03/03/22 1110 03/03/22 1120 03/03/22 1148  BP: (!) 150/69 (!) 149/72 (!) 156/76 (!) 161/79  Pulse: (!) 55 (!) 54 (!) 54 61  Resp: (!) '21 11 13 20  '$ Temp: 98 F (36.7 C)   97.7 F (36.5 C)  TempSrc: Temporal   Oral  SpO2: 98% 97% 98% 100%  Weight:        Intake/Output Summary (Last 24 hours) at 03/03/2022 1321 Last data filed at 03/03/2022 1103 Gross per 24 hour  Intake 740 ml  Output --  Net 740 ml   Filed Weights   03/01/22 0448 03/02/22 0600 03/03/22 0600  Weight: 90.2 kg 89.4 kg 90.7 kg    Telemetry    Sinus rhythm- Personally Reviewed  ECG    None today- Personally Reviewed  Physical Exam    General: Comfortable Head: Atraumatic, normal size  Eyes: PEERLA, EOMI  Neck: Supple, normal JVD Cardiac: Normal S1, S2; RRR; diffuse 4 /6 precordial murmurs, rubs, or gallops Lungs: Clear to auscultation  bilaterally Abd: Soft, nontender, no hepatomegaly  Ext: warm, no edema Musculoskeletal: No deformities, BUE and BLE strength normal and equal Skin: Warm and dry, no rashes   Neuro: Alert and oriented to person, place, time, and situation, CNII-XII grossly intact, no focal deficits  Psych: Normal mood and affect   Labs    Chemistry Recent Labs  Lab 02/26/22 1816 03/01/22 1133 03/03/22 0305  NA 142 142 143  K 3.8 3.3* 3.9  CL 105 104 111  CO2 '26 24 26  '$ GLUCOSE 207* 145* 124*  BUN 24* 18 22  CREATININE 1.39* 1.07 1.51*  CALCIUM 8.8* 8.3* 7.9*  PROT 5.9*  --   --   ALBUMIN 3.7  --   --   AST 26  --   --   ALT 21  --   --   ALKPHOS 46  --   --   BILITOT 0.8  --   --   GFRNONAA 53* >60 48*  ANIONGAP '11 14 6     '$ Hematology Recent Labs  Lab 02/26/22 1816 03/01/22 1133  WBC 5.3 4.8  RBC 4.03* 3.89*  HGB 11.2* 11.0*  HCT 35.1* 34.3*  MCV 87.1 88.2  MCH 27.8 28.3  MCHC 31.9 32.1  RDW 15.1 15.1  PLT 149* 126*    Cardiac EnzymesNo results for input(s): "TROPONINI" in the last 168 hours. No results for input(s): "TROPIPOC" in the last 168 hours.   BNPNo results for input(s): "BNP", "PROBNP" in the last 168 hours.   DDimer No results for input(s): "DDIMER" in the last 168 hours.   Radiology    No results found.  Cardiac Studies   TTE 02/10/2022 IMPRESSIONS     1. Left ventricular ejection fraction, by estimation, is 60 to 65%. The  left ventricle has normal function. The left ventricle has no regional  wall motion abnormalities. There is moderate left ventricular hypertrophy  of the basal-septal segment. Left  ventricular diastolic parameters are indeterminate.   2. Right ventricular systolic function is normal. The right ventricular  size is normal. There is mildly elevated pulmonary artery systolic  pressure.   3. Left atrial size was severely dilated.   4. Right atrial size was mildly dilated.   5. The mitral valve is normal in structure. Mild mitral  valve  regurgitation. No evidence of mitral stenosis.   6. 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 This likely accounts for atypical  location of aortc valve in LVOT. Prior echoes reviewed, valve appears  unchanged from prior. There is evidence of  turbulent flow and trivial perivalvular leak (location of native right  coronary cusp), adjacent to PA and likely prior site of aorta-PA fistula.  There is also mild central valve aortic regurgitation. The aortic valve  has been repaired/replaced. Aortic  valve regurgitation is mild. There is a 23 mm human allograft valve  present in the aortic position. Procedure Date: 05/15/20. Echo findings  are consistent with perivalvular leak of the aortic prosthesis.   7. Aortic root has repair at site of prior abscess/fistula. See comments  re: aortic valve replacement. Aortic root/ascending aorta has been  repaired/replaced.   8. The inferior vena cava is normal in size with greater than 50%  respiratory variability, suggesting right atrial pressure of 3 mmHg.   Comparison(s): Prior images reviewed side by side. See comments re: aortic  valve; atypical placement given history of abscess/fistula in aortic root.   Conclusion(s)/Recommendation(s): In parasternal short axis, there is  significant color doppler flow in the aortic root/sinus of Valsalva on the  [place] location. This color signal is seen in PA as well. In some views it  does not appear to travel between  aorta and PA, but in other views the strong color flow is concerning for  communication. Would consider TEE to evaluate aortic root further and  exclude aorta to PA residual shunting. Findings communicated to ordering  provider and primary cardiologist.   FINDINGS   Left Ventricle: Left ventricular ejection fraction, by estimation, is 60  to 65%. The left ventricle has normal function. The left ventricle has no  regional wall  motion abnormalities. The left ventricular internal cavity  size was normal in size. There is   moderate left ventricular hypertrophy of the basal-septal segment. Left  ventricular diastolic parameters are indeterminate.   Right Ventricle: The right ventricular size is normal. No increase in  right ventricular wall thickness. Right ventricular systolic function is  normal. There is mildly elevated pulmonary artery systolic pressure. The  tricuspid regurgitant velocity is 2.89   m/s, and with an assumed right atrial pressure of 3 mmHg, the estimated  right ventricular  systolic pressure is 22.9 mmHg.   Left Atrium: Left atrial size was severely dilated.   Right Atrium: Right atrial size was mildly dilated.   Pericardium: There is no evidence of pericardial effusion.   Mitral Valve: The mitral valve is normal in structure. Mild mitral valve  regurgitation. No evidence of mitral valve stenosis.   Tricuspid Valve: The tricuspid valve is normal in structure. Tricuspid  valve regurgitation is trivial. No evidence of tricuspid stenosis.   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 This likely accounts for  atypical location of aortc valve in LVOT. Prior echoes reviewed, valve  appears unchanged from prior. There is  evidence of turbulent flow and trivial perivalvular leak (location of  native right coronary cusp), adjacent to PA and likely prior site of  aorta-PA fistula. There is also mild central valve aortic regurgitation.  The aortic valve has been  repaired/replaced. Aortic valve regurgitation is mild. Aortic  regurgitation PHT measures 469 msec. Aortic valve mean gradient measures  7.0 mmHg. Aortic valve peak gradient measures 14.1 mmHg. Aortic valve  area, by VTI measures 2.05 cm. There is a 23 mm  human allograft valve present in the aortic position. Procedure Date:  05/15/20.   Pulmonic Valve: The  pulmonic valve was not well visualized. Pulmonic valve  regurgitation is not visualized.   Aorta: Aortic root has repair at site of prior abscess/fistula. See  comments re: aortic valve replacement. The aortic root/ascending aorta has  been repaired/replaced and the aortic arch was not well visualized.   Venous: The inferior vena cava is normal in size with greater than 50%  respiratory variability, suggesting right atrial pressure of 3 mmHg.   IAS/Shunts: The atrial septum is grossly normal.    CCTA  FINDINGS: Image quality: Excellent.   Noise artifact is: Limited.   Ascending aorta: 37 mm   Aortic root/valve: A 23 mm human aortic graft is present. The root is normal size (NCC 31 mm; RCC 30 mm; LCC 34 mm). There is evidence of extensive aortic root abscess that extends into the LVOT. There is a large pseudoaneurysm under the RCC/LCC commissure in the LVOT. It measures 35 mm x 7.5 mm x 25 mm. 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 The leaflets or the prosthesis open well without restriction. There is thickening at the base of the RCC/LCC leaflets which may represent vegetation. Findings are consistent with endocarditis of the aortic graft.   Coronary Arteries:  Normal coronary origin.  Right dominance.   Left main: The left main is a large caliber vessel with a normal take off from the left coronary cusp that bifurcates to form a left anterior descending artery and a left circumflex artery. There is mild calcified plaque (25-49%).   Left anterior descending artery: The proximal LAD contains minimal calcified plaque (<25%). The mid contains mild mixed density plaque (25-49%). The distal LAD is patent. D1 contains minimal mixed density plaque (<25%). D2 contains mild mixed density plaque (25-49%). D3 contains minimal mixed density plaque (<25%).   Left circumflex artery:  The LCX is non-dominant. The proximal LCX contains minimal calcified plaque (<25%). The distal LCX contains mild calcified plaque (25-49%). OM1 is patent. OM2 contains minimal mixed density plaque (<25%). OM3 is patent.   Right coronary artery: The RCA is dominant with normal take off from the right coronary  cusp. The proximal RCA contains minimal mixed density plaque (<25%). The mid RCA contains moderate calcified plaque (50-69%). The distal RCA contains mild calcified plaque (25-49%). The RCA terminates as a PDA and right posterolateral branch without evidence of plaque or stenosis.   Right Atrium: Right atrial size is within normal limits.   Right Ventricle: The right ventricular cavity is within normal limits.   Left Atrium: Left atrial size is normal in size with no left atrial appendage filling defect.   Left Ventricle: The ventricular cavity size is within normal limits.   Pulmonary arteries: Mildly dilated suggestive of pulmonary hypertension.   Pulmonary veins: Normal pulmonary venous drainage.   Pericardium: Normal thickness without significant effusion or calcium present.   Cardiac valves: The aortic valve is trileaflet without significant calcification. The mitral valve is normal without significant calcification.   Aorta: Normal caliber without significant disease.   Extra-cardiac findings: See attached radiology report for non-cardiac structures.   IMPRESSION: 1. Coronary calcium score of 1915. This was 97th percentile for age-, sex, and race-matched controls. Plaque volume 1254 mm3.   2. Normal coronary origin with right dominance.   3. Mild calcified plaque (25-49%) in the left main, LAD, and LCX.   4. Moderate calcified plaque in the mid RCA (50-69%).   5. 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 Thickening at the base of the RCC/LCC leaflets which  may represent vegetation. 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   6. Dilated pulmonary artery suggestive of pulmonary hypertension.   RECOMMENDATIONS: 1. Moderate stenosis in the mid RCA (50-69%). Consider symptom-guided anti-ischemic pharmacotherapy as well as risk factor modification per guideline directed care. Additional analysis with CT FFR will be submitted.   Eleonore Chiquito, MD   Electronically Signed   By: Eleonore Chiquito M.D.   On: 02/25/2022 21:52  Patient Profile     76 y.o. male history of aortic valve replacement who presents with aortic valve endocarditis   Assessment & Plan    Aortic Valve endocarditis -TEE further recommendation after team review.  Nonobstructive coronary artery disease-no angina.  FFR negative continue aspirin and statin.  Heart failure preserved ejection fraction-no apparent fluid overload continue torsemide and Aldactone.  Hypertension-blood pressure at target.  Hyperlipidemia-continue current statin dose.  Hypothyroidism-continue Synthroid.  CKD stage 3 - avoid nephro toxins  Metastatic prostate cancer-continue home regimen with Zytiga and prednisone   For questions or updates, please contact [place] Please consult [email or web address] for contact info under Cardiology/STEMI.      Signed, Adalie Mand, DO  03/03/2022, 1:21 PM

## 2022-03-03 NOTE — Anesthesia Procedure Notes (Signed)
Procedure Name: MAC Date/Time: 03/03/2022 9:56 AM  Performed by: Anastasio Auerbach, CRNAPatient Re-evaluated:Patient Re-evaluated prior to induction Oxygen Delivery Method: Nasal cannula Induction Type: IV induction

## 2022-03-04 ENCOUNTER — Inpatient Hospital Stay: Payer: Self-pay

## 2022-03-04 ENCOUNTER — Encounter (HOSPITAL_COMMUNITY): Payer: Self-pay | Admitting: Cardiology

## 2022-03-04 DIAGNOSIS — I330 Acute and subacute infective endocarditis: Secondary | ICD-10-CM | POA: Diagnosis not present

## 2022-03-04 DIAGNOSIS — I38 Endocarditis, valve unspecified: Secondary | ICD-10-CM | POA: Diagnosis not present

## 2022-03-04 LAB — GLUCOSE, CAPILLARY
Glucose-Capillary: 241 mg/dL — ABNORMAL HIGH (ref 70–99)
Glucose-Capillary: 114 mg/dL — ABNORMAL HIGH (ref 70–99)
Glucose-Capillary: 223 mg/dL — ABNORMAL HIGH (ref 70–99)

## 2022-03-04 MED ORDER — SPIRONOLACTONE 25 MG PO TABS: 25.0000 mg | ORAL_TABLET | ORAL | Status: DC

## 2022-03-04 MED ORDER — CHLORHEXIDINE GLUCONATE CLOTH 2 % EX PADS: 6.0000 | MEDICATED_PAD | Freq: Every day | CUTANEOUS | Status: DC

## 2022-03-04 MED ORDER — CEFTRIAXONE IV (FOR PTA / DISCHARGE USE ONLY): 2.0000 g | INTRAVENOUS | 0 refills | Status: AC

## 2022-03-04 NOTE — Progress Notes (Signed)
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 Thickening at the base of the RCC/LCC leaflets which may represent vegetation. 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 -Per CTS(Dr. Roxan Hockey review of echo on 5/22 showed cavity of old abscess, not significantly changed on recent echo. Given no leukocytosis, fever, suspect chronic process rather than infection. Awaiting TEE. Started on dapt+ ceftriaxone. -Pt reports tenderness at chest surgical incision site has been present since initial heart surgery. No drainage reported. CT coronary did not note any concerning derma/subq findings.  -TEE showed pseudoaneurysm below RCC/LCC with persistent aorto-PA fistula, echolucent sapce with concern for aortic root abscess. Spoke with cardiology, surgical decision ongoing. Plan to discharge pt.  This is may be a chronic issue as pt has not fever/leukocytosis. Will treat with another 6 weeks of antibiotics while awaiting surgical  management.  -Bcx NG from admission Recommendations: -D/C daptomycin -Continue ceftriaxone  to compete 6 weeks of antibiotics EIT 8/18(target Strep that he grew in the past) -Place PICC -Follow-up in ID clinic  OPAT ORDERS:  Diagnosis: Endocarditis Prosthetic aortic valve    No Known Allergies   Discharge antibiotics to be given via PICC line:  Per pharmacy protocol ceftriaxone 2gm q24h Aim for Vancomycin trough 15-20 or AUC 400-550 (unless otherwise indicated)   Duration: 6 weeks End Date: 04/09/22  [facility] Care Per Protocol with [employer] Use: Home health RN for IV administration and teaching, line care and labs.    Labs weekly while on IV antibiotics: __ CBC with differential __ CMP __ CRP __ ESR  __ Please pull PIC at completion of IV antibiotics   Fax weekly labs to [phone]  Clinic Follow Up Appt: 7/25  @ [facility] with Dr. Candiss Norse     Microbiology:   Antibiotics: Ceftriaxone and daptomycin 7/8-p Cultures: Blood 7/7 NG    SUBJECTIVE: Resting in bed. No new complaints  Review of Systems: Review of Systems  All other systems reviewed and are negative.    Scheduled Meds:  abiraterone acetate  1,000 mg Oral QAC breakfast   aspirin EC  81 mg Oral Daily   atorvastatin  80 mg Oral Daily   doxazosin  8 mg Oral QHS   enoxaparin (LOVENOX) injection  40 mg Subcutaneous Q24H   ferrous sulfate  325 mg Oral Daily   insulin aspart  0-15 Units Subcutaneous TID WC   labetalol  400 mg Oral QHS   labetalol  600 mg Oral Daily   levothyroxine  125 mcg Oral Daily   pantoprazole  40 mg  Oral Daily   potassium chloride SA  20 mEq Oral Daily   predniSONE  5 mg Oral Q breakfast   spironolactone  25 mg Oral QODAY   torsemide  20 mg Oral Daily   Continuous Infusions:  cefTRIAXone (ROCEPHIN)  IV 2 g (03/02/22 1318)   DAPTOmycin (CUBICIN) 700 mg in sodium chloride 0.9 % IVPB 700 mg (03/03/22 2045)   PRN Meds:.acetaminophen, nitroGLYCERIN, ondansetron (ZOFRAN)  IV No Known Allergies  OBJECTIVE: Vitals:   03/03/22 1120 03/03/22 1148 03/03/22 2050 03/04/22 0506  BP: (!) 156/76 (!) 161/79 129/68 (!) 147/73  Pulse: (!) 54 61  (!) 59  Resp: '13 20 18 16  ' Temp:  97.7 F (36.5 C) 99.4 F (37.4 C) 98.8 F (37.1 C)  TempSrc:  Oral Oral Oral  SpO2: 98% 100% 100%   Weight:    90.7 kg   Body mass index is 30.41 kg/m.  Physical Exam Constitutional:      General: He is not in acute distress.    Appearance: He is normal weight. He is not toxic-appearing.  HENT:     Head: Normocephalic and atraumatic.     Right Ear: External ear normal.     Left Ear: External ear normal.     Nose: No congestion or rhinorrhea.     Mouth/Throat:     Mouth: Mucous membranes are moist.     Pharynx: Oropharynx is clear.  Eyes:     Extraocular Movements: Extraocular movements intact.     Conjunctiva/sclera: Conjunctivae normal.     Pupils: Pupils are equal, round, and reactive to light.  Cardiovascular:     Rate and Rhythm: Normal rate and regular rhythm.     Heart sounds: No murmur heard.    No friction rub. No gallop.  Pulmonary:     Effort: Pulmonary effort is normal.     Breath sounds: Normal breath sounds.  Abdominal:     General: Abdomen is flat. Bowel sounds are normal.     Palpations: Abdomen is soft.  Musculoskeletal:        General: No swelling. Normal range of motion.     Cervical back: Normal range of motion and neck supple.  Skin:    General: Skin is warm and dry.     Comments: Midline chest surgical scar-keloid, tender on palpation  Neurological:     General: No focal deficit present.     Mental Status: He is oriented to person, place, and time.  Psychiatric:        Mood and Affect: Mood normal.       Lab Results Lab Results  Component Value Date   WBC 4.8 03/01/2022   HGB 11.0 (L) 03/01/2022   HCT 34.3 (L) 03/01/2022   MCV 88.2 03/01/2022   PLT 126 (L) 03/01/2022    Lab Results  Component Value Date   CREATININE 1.51 (H)  03/03/2022   BUN 22 03/03/2022   NA 143 03/03/2022   K 3.9 03/03/2022   CL 111 03/03/2022   CO2 26 03/03/2022    Lab Results  Component Value Date   ALT 21 02/26/2022   AST 26 02/26/2022   ALKPHOS 46 02/26/2022   BILITOT 0.8 02/26/2022        Laurice Record, [facility] 03/04/2022, 7:23 AM

## 2022-03-04 NOTE — Plan of Care (Signed)
  Problem: Education: Goal: Knowledge of General Education information will improve Description: Including pain rating scale, medication(s)/side effects and non-pharmacologic comfort measures Outcome: Progressing   Problem: Health Behavior/Discharge Planning: Goal: Ability to manage health-related needs will improve Outcome: Progressing   Problem: Clinical Measurements: Goal: Ability to maintain clinical measurements within normal limits will improve Outcome: Progressing Goal: Cardiovascular complication will be avoided Outcome: Progressing   Problem: Nutrition: Goal: Adequate nutrition will be maintained Outcome: Progressing   Problem: Coping: Goal: Level of anxiety will decrease Outcome: Progressing   Problem: Elimination: Goal: Will not experience complications related to bowel motility Outcome: Progressing Goal: Will not experience complications related to urinary retention Outcome: Progressing   Problem: Pain Managment: Goal: General experience of comfort will improve Outcome: Progressing   Problem: Safety: Goal: Ability to remain free from injury will improve Outcome: Progressing   Problem: Skin Integrity: Goal: Risk for impaired skin integrity will decrease Outcome: Progressing

## 2022-03-04 NOTE — Progress Notes (Signed)
[facility] consulted for PICC placement via IV Assessment order.  Unit RN contacted via phone regarding need for PICC protocol order.  He verbalized he will look into it and get appropriate order placed.

## 2022-03-04 NOTE — Plan of Care (Signed)
  Problem: Clinical Measurements: Goal: Ability to maintain clinical measurements within normal limits will improve Outcome: Progressing Goal: Cardiovascular complication will be avoided Outcome: Progressing   Problem: Activity: Goal: Risk for activity intolerance will decrease Outcome: Progressing

## 2022-03-04 NOTE — Progress Notes (Signed)
PHARMACY CONSULT NOTE FOR:  OUTPATIENT  PARENTERAL ANTIBIOTIC THERAPY (OPAT)  Indication: Possible aortic valve endocarditis- hx of strep Regimen: Ceftriaxone 2 gm IV Q 24 hours End date: 04/09/22   IV antibiotic discharge orders are pended. To discharging provider:  please sign these orders via discharge navigator,  Select New Orders & click on the button choice - Manage This Unsigned Work.     Thank you for allowing pharmacy to be a part of this patient's care.  Jimmy Footman, PharmD, BCPS, BCIDP Infectious Diseases Clinical Pharmacist Phone: [phone] 03/04/2022, 11:24 AM

## 2022-03-04 NOTE — Progress Notes (Signed)
1 Day Post-Op Procedure(s) (LRB): TRANSESOPHAGEAL ECHOCARDIOGRAM (TEE) (N/A) BUBBLE STUDY Subjective: No complaint today  Objective: Vital signs in last 24 hours: Temp:  [98.5 F (36.9 C)-99.4 F (37.4 C)] 98.5 F (36.9 C) (07/13 1205) Pulse Rate:  [52-59] 52 (07/13 1205) Cardiac Rhythm: Normal sinus rhythm (07/13 0900) Resp:  [16-18] 16 (07/13 1205) BP: (125-147)/(68-73) 125/72 (07/13 1205) SpO2:  [100 %] 100 % (07/12 2050) Weight:  [90.7 kg] 90.7 kg (07/13 0506)  Hemodynamic parameters for last 24 hours:    Intake/Output from previous day: 07/12 0701 - 07/13 0700 In: 500 [I.V.:500] Out: 1 [Stool:1] Intake/Output this shift: No intake/output data recorded.  General appearance: alert, cooperative, and no distress Heart: murmur unchanged  Lab Results: No results for input(s): "WBC", "HGB", "HCT", "PLT" in the last 72 hours. BMET:  Recent Labs    03/03/22 0305  NA 143  K 3.9  CL 111  CO2 26  GLUCOSE 124*  BUN 22  CREATININE 1.51*  CALCIUM 7.9*    PT/INR: No results for input(s): "LABPROT", "INR" in the last 72 hours. ABG    Component Value Date/Time   PHART 7.431 05/16/2020 0457   HCO3 23.2 05/16/2020 0457   TCO2 24 05/16/2020 0457   ACIDBASEDEF 1.0 05/16/2020 0457   O2SAT 92.0 05/16/2020 0457   CBG (last 3)  Recent Labs    03/04/22 0800 03/04/22 1204 03/04/22 1631  GLUCAP 241* 114* 223*    Assessment/Plan: S/P Procedure(s) (LRB): TRANSESOPHAGEAL ECHOCARDIOGRAM (TEE) (N/A) BUBBLE STUDY - I reviewed the TEE and findings were as expected Breakdown of prior homograft repair of annular abscess. Was present on echo back in May 2022. Repair would involve redo root and reconstructing outflow tract and closure of PA fistula. It is difficult to see how that complex a repair will hold up in his case. Patient to be discharged and f/u with Dr. Burt Knack.  I will be in touch with Dr. Burt Knack as we sort out the best option for Mr. Brann.   LOS: 6 days     Melrose Nakayama 03/04/2022

## 2022-03-04 NOTE — Progress Notes (Signed)
Peripherally Inserted Central Catheter Placement  The IV Nurse has discussed with the patient and/or persons authorized to consent for the patient, the purpose of this procedure and the potential benefits and risks involved with this procedure.  The benefits include less needle sticks, lab draws from the catheter, and the patient may be discharged home with the catheter. Risks include, but not limited to, infection, bleeding, blood clot (thrombus formation), and puncture of an artery; nerve damage and irregular heartbeat and possibility to perform a PICC exchange if needed/ordered by physician.  Alternatives to this procedure were also discussed.  Bard Power PICC patient education guide, fact sheet on infection prevention and patient information card has been provided to patient /or left at bedside.    PICC Placement Documentation  PICC Single Lumen 35/45/62 Right Basilic 42 cm 0 cm (Active)  Indication for Insertion or Continuance of Line Home intravenous therapies (PICC only) 03/04/22 1336  Exposed Catheter (cm) 0 cm 03/04/22 1336  Site Assessment Clean, Dry, Intact 03/04/22 1336  Line Status Flushed;Saline locked;Blood return noted 03/04/22 1336  Dressing Type Transparent;Securing device 03/04/22 1336  Dressing Status Antimicrobial disc in place 03/04/22 [place] Not Applicable 56/38/93 7342  Dressing Change Due 03/11/22 03/04/22 [place] 03/04/2022, 1:39 PM

## 2022-03-04 NOTE — Progress Notes (Signed)
Progress Note  Patient Name: Matthew Serrano. Date of Encounter: 03/04/2022  Primary Cardiologist: [name], MD   Subjective   Patient seen examined at his bedside.    Inpatient Medications    Scheduled Meds:  abiraterone acetate  1,000 mg Oral QAC breakfast   aspirin EC  81 mg Oral Daily   atorvastatin  80 mg Oral Daily   doxazosin  8 mg Oral QHS   enoxaparin (LOVENOX) injection  40 mg Subcutaneous Q24H   ferrous sulfate  325 mg Oral Daily   insulin aspart  0-15 Units Subcutaneous TID WC   labetalol  400 mg Oral QHS   labetalol  600 mg Oral Daily   levothyroxine  125 mcg Oral Daily   pantoprazole  40 mg Oral Daily   potassium chloride SA  20 mEq Oral Daily   predniSONE  5 mg Oral Q breakfast   spironolactone  25 mg Oral QODAY   torsemide  20 mg Oral Daily   Continuous Infusions:  cefTRIAXone (ROCEPHIN)  IV 2 g (03/02/22 1318)   DAPTOmycin (CUBICIN) 700 mg in sodium chloride 0.9 % IVPB 700 mg (03/03/22 2045)   PRN Meds: acetaminophen, nitroGLYCERIN, ondansetron (ZOFRAN) IV   Vital Signs    Vitals:   03/03/22 1120 03/03/22 1148 03/03/22 2050 03/04/22 0506  BP: (!) 156/76 (!) 161/79 129/68 (!) 147/73  Pulse: (!) 54 61  (!) 59  Resp: '13 20 18 16  '$ Temp:  97.7 F (36.5 C) 99.4 F (37.4 C) 98.8 F (37.1 C)  TempSrc:  Oral Oral Oral  SpO2: 98% 100% 100%   Weight:    90.7 kg    Intake/Output Summary (Last 24 hours) at 03/04/2022 1111 Last data filed at 03/03/2022 1700 Gross per 24 hour  Intake --  Output 1 ml  Net -1 ml   Filed Weights   03/02/22 0600 03/03/22 0600 03/04/22 0506  Weight: 89.4 kg 90.7 kg 90.7 kg    Telemetry    Sinus rhythm- Personally Reviewed  ECG    None today- Personally Reviewed  Physical Exam    General: Comfortable Head: Atraumatic, normal size  Eyes: PEERLA, EOMI  Neck: Supple, normal JVD Cardiac: Normal S1, S2; RRR; diffuse 4 /6 precordial murmurs, rubs, or gallops Lungs: Clear to auscultation bilaterally Abd:  Soft, nontender, no hepatomegaly  Ext: warm, no edema Musculoskeletal: No deformities, BUE and BLE strength normal and equal Skin: Warm and dry, no rashes   Neuro: Alert and oriented to person, place, time, and situation, CNII-XII grossly intact, no focal deficits  Psych: Normal mood and affect   Labs    Chemistry Recent Labs  Lab 02/26/22 1816 03/01/22 1133 03/03/22 0305  NA 142 142 143  K 3.8 3.3* 3.9  CL 105 104 111  CO2 '26 24 26  '$ GLUCOSE 207* 145* 124*  BUN 24* 18 22  CREATININE 1.39* 1.07 1.51*  CALCIUM 8.8* 8.3* 7.9*  PROT 5.9*  --   --   ALBUMIN 3.7  --   --   AST 26  --   --   ALT 21  --   --   ALKPHOS 46  --   --   BILITOT 0.8  --   --   GFRNONAA 53* >60 48*  ANIONGAP '11 14 6     '$ Hematology Recent Labs  Lab 02/26/22 1816 03/01/22 1133  WBC 5.3 4.8  RBC 4.03* 3.89*  HGB 11.2* 11.0*  HCT 35.1* 34.3*  MCV 87.1 88.2  MCH 27.8 28.3  MCHC 31.9 32.1  RDW 15.1 15.1  PLT 149* 126*    Cardiac EnzymesNo results for input(s): "TROPONINI" in the last 168 hours. No results for input(s): "TROPIPOC" in the last 168 hours.   BNPNo results for input(s): "BNP", "PROBNP" in the last 168 hours.   DDimer No results for input(s): "DDIMER" in the last 168 hours.   Radiology    [place] EKG SITE RITE  Result Date: 03/04/2022 If Site Rite image not attached, placement could not be confirmed due to current cardiac rhythm.  ECHO TEE  Result Date: 03/03/2022    TRANSESOPHOGEAL ECHO REPORT   Patient Name:   Matthew Serrano. Date of Exam: 03/03/2022 Medical Rec #:  [id number]          Height:       68.0 in Accession #:    [id number]         Weight:       200.0 lb Date of Birth:  1946-08-07         BSA:          2.044 m Patient Age:    67 years           BP:           177/85 mmHg Patient Gender: M                  HR:           53 bpm. Exam Location:  Inpatient Procedure: 3D Echo, Transesophageal Echo, Cardiac Doppler and Color Doppler Indications:     Endocarditis  History:          Patient has prior history of Echocardiogram examinations, most                  recent 02/10/2022. CAD, Pulmonary HTN, Aortic Valve Disease,                  Signs/Symptoms:Chest Pain; Risk Factors:Hypertension and                  Diabetes. ETOH. Aortic/pulmonary fistula.  Sonographer:     Roseanna Rainbow RDCS Referring Phys:  [id number] Darreld Mclean Diagnosing Phys: Gwyndolyn Kaufman MD  Sonographer Comments: Technically difficult study due to poor echo windows. PROCEDURE: After discussion of the risks and benefits of a TEE, an informed consent was obtained from the patient. The transesophogeal probe was passed without difficulty through the esophogus of the patient. Imaged were obtained with the patient in a left lateral decubitus position. Sedation performed by different physician. The patient was monitored while under deep sedation. Anesthestetic sedation was provided intravenously by Anesthesiology: '557mg'$  of Propofol. The patient's vital signs; including heart rate, blood pressure, and oxygen saturation; remained stable throughout the procedure. Supplementary images were obtained from transthoracic windows as indicated to answer the clinical question. The patient developed no complications during the procedure. IMPRESSIONS  1. The patient is s/p 29m human allograft aortic root repair. 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 There is persistent flow visualized from the aortic root into the main PA just above the level of the pulmonic valve consistent with a persistent aorto-pulmonary fistula. The aortc valve leaflets have normal mobility with no vegetations visualized. There is  mild central aortic regurgitation. The aortic valve has been repaired/replaced.  2. Left ventricular ejection  fraction, by estimation, is 65 to 70%. The left ventricle has normal function.  3. Right ventricular systolic function is normal. The right ventricular size is normal.  4. Left atrial size was moderately dilated. No left atrial/left atrial appendage thrombus was detected.  5. The mitral valve is grossly normal. Trivial mitral valve regurgitation. No evidence of mitral stenosis.  6. Aortic root/ascending aorta has been repaired/replaced. There is Moderate (Grade III) plaque.  7. Evidence of atrial level shunting detected by color flow Doppler. Agitated saline contrast bubble study was positive with shunting observed within 3-6 cardiac cycles suggestive of interatrial shunt. FINDINGS  Left Ventricle: Left ventricular ejection fraction, by estimation, is 65 to 70%. The left ventricle has normal function. The left ventricular internal cavity size was normal in size. Right Ventricle: The right ventricular size is normal. No increase in right ventricular wall thickness. Right ventricular systolic function is normal. Left Atrium: Left atrial size was moderately dilated. No left atrial/left atrial appendage thrombus was detected. Right Atrium: Right atrial size was normal in size. Pericardium: There is no evidence of pericardial effusion. Mitral Valve: The mitral valve is grossly normal. There is mild thickening of the mitral valve leaflet(s). There is mild calcification of the mitral valve leaflet(s). Trivial mitral valve regurgitation. No evidence of mitral valve stenosis. Tricuspid Valve: The tricuspid valve is normal in structure. Tricuspid valve regurgitation is trivial. Aortic Valve: The patient is s/p 13m human allograft aortic root repair. 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 There is  persistent flow visualized from the aortic root into the main PA just above the level of the pulmonic valve consistent with a persistent aorto-pulmonary fistula. The aortc valve leaflets have normal mobility with no vegetations visualized. There is mild central aortic regurgitation. The aortic valve has been repaired/replaced. Aortic valve mean gradient measures 2.0 mmHg. Aortic valve peak gradient measures 4.9 mmHg. Pulmonic Valve: The pulmonic valve was normal in structure. Pulmonic valve regurgitation is trivial. Aorta: The aortic root/ascending aorta has been repaired/replaced. There is moderate (Grade III) plaque. IAS/Shunts: The interatrial septum is aneurysmal. Evidence of atrial level shunting detected by color flow Doppler. Agitated saline contrast bubble study was positive with shunting observed within 3-6 cardiac cycles suggestive of interatrial shunt.  LEFT VENTRICLE PLAX 2D LVOT diam:     2.10 cm LVOT Area:     3.46 cm  AORTIC VALVE AV Vmax:      111.00 cm/s AV Vmean:     67.700 cm/s AV VTI:       0.230 m AV Peak Grad: 4.9 mmHg AV Mean Grad: 2.0 mmHg  SHUNTS Systemic Diam: 2.10 cm HGwyndolyn KaufmanMD Electronically signed by HGwyndolyn KaufmanMD Signature Date/Time: 03/03/2022/4:49:03 PM    Final (Updated)     Cardiac Studies   TEE 03/03/2022 IMPRESSIONS   1. The patient is s/p 233mhuman allograft aortic root repair. There is a  pseudoaneurysm present below the level of the RCC and LCC cusps extending into the LVOT. 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 There is persistent flow  visualized from the aortic root into the main PA just above the level of the pulmonic valve consistent with a persistent aorto-pulmonary fistula.  The aortc valve leaflets have normal mobility with no vegetations visualized. There is mild central aortic regurgitation. The aortic valve has been  repaired/replaced.   2. Left ventricular ejection fraction, by estimation, is 65 to 70%. The  left ventricle has normal function.   3. Right ventricular systolic function is normal. The right ventricular  size is normal.   4. Left atrial size was moderately dilated. No left atrial/left atrial  appendage thrombus was detected.   5. The mitral valve is grossly normal. Trivial mitral valve  regurgitation. No evidence of mitral stenosis.   6. Aortic root/ascending aorta has been repaired/replaced. There is  Moderate (Grade III) plaque.   7. Evidence of atrial level shunting detected by color flow Doppler.  Agitated saline contrast bubble study was positive with shunting observed  within 3-6 cardiac cycles suggestive of interatrial shunt.   FINDINGS   Left Ventricle: Left ventricular ejection fraction, by estimation, is 65  to 70%. The left ventricle has normal function. The left ventricular  internal cavity size was normal in size.   Right Ventricle: The right ventricular size is normal. No increase in  right ventricular wall thickness. Right ventricular systolic function is  normal.   Left Atrium: Left atrial size was moderately dilated. No left atrial/left  atrial appendage thrombus was detected.   Right Atrium: Right atrial size was normal in size.   Pericardium: There is no evidence of pericardial effusion.   Mitral Valve: The mitral valve is grossly normal. There is mild thickening  of the mitral valve leaflet(s). There is mild calcification of the mitral  valve leaflet(s). Trivial mitral valve regurgitation. No evidence of  mitral valve stenosis.   Tricuspid Valve: The tricuspid valve is normal in structure. Tricuspid  valve regurgitation is trivial.   Aortic Valve: The patient is s/p 67m human allograft aortic root repair.  There is a pseudoaneurysm present below the level of the RCC and LCC cusps  extending into the LVOT. There is an echolucent space along the region of   the RCC extending into the LVOT   with rocking motion of the aortic valve prosthesis concerning for aortic  root abscess. There is persistent flow visualized from the aortic root  into the main PA just above the level of the pulmonic valve consistent  with a persistent aorto-pulmonary  fistula. The aortc valve leaflets have normal mobility with no vegetations  visualized. There is mild central aortic regurgitation. The aortic valve  has been repaired/replaced. Aortic valve mean gradient measures 2.0 mmHg.  Aortic valve peak gradient  measures 4.9 mmHg.   Pulmonic Valve: The pulmonic valve was normal in structure. Pulmonic valve  regurgitation is trivial.   Aorta: The aortic root/ascending aorta has been repaired/replaced. There  is moderate (Grade III) plaque.   IAS/Shunts: The interatrial septum is aneurysmal. Evidence of atrial level  shunting detected by color flow Doppler. Agitated saline contrast bubble  study was positive with shunting observed within 3-6 cardiac cycles  suggestive of interatrial shunt.     TTE 02/10/2022 IMPRESSIONS   1. Left ventricular ejection fraction, by estimation, is 60 to 65%. The  left ventricle has normal function. The left ventricle has no regional  wall motion abnormalities. There is moderate left ventricular hypertrophy  of the basal-septal segment. Left  ventricular diastolic parameters are indeterminate.   2. Right ventricular systolic  function is normal. The right ventricular  size is normal. There is mildly elevated pulmonary artery systolic  pressure.   3. Left atrial size was severely dilated.   4. Right atrial size was mildly dilated.   5. The mitral valve is normal in structure. Mild mitral valve  regurgitation. No evidence of mitral stenosis.   6. 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 This likely accounts for atypical  location of aortc valve in LVOT. Prior echoes  reviewed, valve appears  unchanged from prior. There is evidence of  turbulent flow and trivial perivalvular leak (location of native right  coronary cusp), adjacent to PA and likely prior site of aorta-PA fistula.  There is also mild central valve aortic regurgitation. The aortic valve  has been repaired/replaced. Aortic  valve regurgitation is mild. There is a 23 mm human allograft valve  present in the aortic position. Procedure Date: 05/15/20. Echo findings  are consistent with perivalvular leak of the aortic prosthesis.   7. Aortic root has repair at site of prior abscess/fistula. See comments  re: aortic valve replacement. Aortic root/ascending aorta has been  repaired/replaced.   8. The inferior vena cava is normal in size with greater than 50%  respiratory variability, suggesting right atrial pressure of 3 mmHg.   Comparison(s): Prior images reviewed side by side. See comments re: aortic  valve; atypical placement given history of abscess/fistula in aortic root.   Conclusion(s)/Recommendation(s): In parasternal short axis, there is  significant color doppler flow in the aortic root/sinus of Valsalva on the  [place] location. This color signal is seen in PA as well. In some views it  does not appear to travel between  aorta and PA, but in other views the strong color flow is concerning for  communication. Would consider TEE to evaluate aortic root further and  exclude aorta to PA residual shunting. Findings communicated to ordering  provider and primary cardiologist.   FINDINGS   Left Ventricle: Left ventricular ejection fraction, by estimation, is 60  to 65%. The left ventricle has normal function. The left ventricle has no  regional wall motion abnormalities. The left ventricular internal cavity  size was normal in size. There is   moderate left ventricular hypertrophy of the basal-septal segment. Left  ventricular diastolic parameters are indeterminate.   Right Ventricle: The  right ventricular size is normal. No increase in  right ventricular wall thickness. Right ventricular systolic function is  normal. There is mildly elevated pulmonary artery systolic pressure. The  tricuspid regurgitant velocity is 2.89   m/s, and with an assumed right atrial pressure of 3 mmHg, the estimated  right ventricular systolic pressure is 44.0 mmHg.   Left Atrium: Left atrial size was severely dilated.   Right Atrium: Right atrial size was mildly dilated.   Pericardium: There is no evidence of pericardial effusion.   Mitral Valve: The mitral valve is normal in structure. Mild mitral valve  regurgitation. No evidence of mitral valve stenosis.   Tricuspid Valve: The tricuspid valve is normal in structure. Tricuspid  valve regurgitation is trivial. No evidence of tricuspid stenosis.   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 This likely accounts for  atypical location of aortc valve in LVOT. Prior echoes reviewed, valve  appears unchanged from prior. There is  evidence of turbulent flow and trivial perivalvular leak (location of  native right coronary cusp),  adjacent to PA and likely prior site of  aorta-PA fistula. There is also mild central valve aortic regurgitation.  The aortic valve has been  repaired/replaced. Aortic valve regurgitation is mild. Aortic  regurgitation PHT measures 469 msec. Aortic valve mean gradient measures  7.0 mmHg. Aortic valve peak gradient measures 14.1 mmHg. Aortic valve  area, by VTI measures 2.05 cm. There is a 23 mm  human allograft valve present in the aortic position. Procedure Date:  05/15/20.   Pulmonic Valve: The pulmonic valve was not well visualized. Pulmonic valve  regurgitation is not visualized.   Aorta: Aortic root has repair at site of prior abscess/fistula. See  comments re: aortic valve replacement. The aortic root/ascending aorta has  been  repaired/replaced and the aortic arch was not well visualized.   Venous: The inferior vena cava is normal in size with greater than 50%  respiratory variability, suggesting right atrial pressure of 3 mmHg.   IAS/Shunts: The atrial septum is grossly normal.    CCTA  FINDINGS: Image quality: Excellent.   Noise artifact is: Limited.   Ascending aorta: 37 mm   Aortic root/valve: A 23 mm human aortic graft is present. The root is normal size (NCC 31 mm; RCC 30 mm; LCC 34 mm). There is evidence of extensive aortic root abscess that extends into the LVOT. There is a large pseudoaneurysm under the RCC/LCC commissure in the LVOT. It measures 35 mm x 7.5 mm x 25 mm. 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 The leaflets or the prosthesis open well without restriction. There is thickening at the base of the RCC/LCC leaflets which may represent vegetation. Findings are consistent with endocarditis of the aortic graft.   Coronary Arteries:  Normal coronary origin.  Right dominance.   Left main: The left main is a large caliber vessel with a normal take off from the left coronary cusp that bifurcates to form a left anterior descending artery and a left circumflex artery. There is mild calcified plaque (25-49%).   Left anterior descending artery: The proximal LAD contains minimal calcified plaque (<25%). The mid contains mild mixed density plaque (25-49%). The distal LAD is patent. D1 contains minimal mixed density plaque (<25%). D2 contains mild mixed density plaque (25-49%). D3 contains minimal mixed density plaque (<25%).   Left circumflex artery: The LCX is non-dominant. The proximal LCX contains minimal calcified plaque (<25%). The distal LCX contains mild calcified plaque (25-49%). OM1 is patent. OM2 contains minimal mixed density plaque (<25%). OM3 is patent.   Right coronary  artery: The RCA is dominant with normal take off from the right coronary cusp. The proximal RCA contains minimal mixed density plaque (<25%). The mid RCA contains moderate calcified plaque (50-69%). The distal RCA contains mild calcified plaque (25-49%). The RCA terminates as a PDA and right posterolateral branch without evidence of plaque or stenosis.   Right Atrium: Right atrial size is within normal limits.   Right Ventricle: The right ventricular cavity is within normal limits.   Left Atrium: Left atrial size is normal in size with no left atrial appendage filling defect.   Left Ventricle: The ventricular cavity size is within normal limits.   Pulmonary arteries: Mildly dilated suggestive of pulmonary hypertension.   Pulmonary veins: Normal pulmonary venous drainage.   Pericardium: Normal thickness without significant effusion or calcium present.   Cardiac valves: The aortic valve is trileaflet without significant calcification. The mitral  valve is normal without significant calcification.   Aorta: Normal caliber without significant disease.   Extra-cardiac findings: See attached radiology report for non-cardiac structures.   IMPRESSION: 1. Coronary calcium score of 1915. This was 97th percentile for age-, sex, and race-matched controls. Plaque volume 1254 mm3.   2. Normal coronary origin with right dominance.   3. Mild calcified plaque (25-49%) in the left main, LAD, and LCX.   4. Moderate calcified plaque in the mid RCA (50-69%).   5. 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 Thickening at the base of the RCC/LCC leaflets which may represent vegetation. 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   6. Dilated pulmonary artery suggestive of pulmonary hypertension.   RECOMMENDATIONS: 1. Moderate stenosis in the mid RCA (50-69%).  Consider symptom-guided anti-ischemic pharmacotherapy as well as risk factor modification per guideline directed care. Additional analysis with CT FFR will be submitted.   Eleonore Chiquito, MD   Electronically Signed   By: Eleonore Chiquito M.D.   On: 02/25/2022 21:52  Patient Profile     76 y.o. male history of aortic valve replacement who presents with aortic valve endocarditis   Assessment & Plan    Aortic Valve endocarditis -TEE completed yesterday.  After complete review and discussions with surgery shared decision this will be taken to the heart team for further discussion.  No plan for any intervention within the next week.  As discussed with ID as well in the interim we will discharge the patient home with IV antibiotics.  PICC line will be inserted hopefully today.  I discussed in detail about this with the patient.  We will discuss with case management team if the best plan of discharge care for his IV antibiotic if is home versus SNF.  Nonobstructive coronary artery disease-no angina.  FFR negative continue aspirin and statin.  Heart failure preserved ejection fraction-no apparent fluid overload continue torsemide and Aldactone.  Hypertension-blood pressure at target.  Hyperlipidemia-continue current statin dose.  Hypothyroidism-continue Synthroid.  CKD stage 3 - avoid nephro toxins  Metastatic prostate cancer-continue home regimen with Zytiga and prednisone   For questions or updates, please contact [place] Please consult [email or web address] for contact info under Cardiology/STEMI.      Signed, Aslan Montagna, DO  03/04/2022, 11:11 AM

## 2022-03-04 NOTE — TOC Initial Note (Addendum)
Transition of Care (TOC) - Initial/Assessment Note    Patient Details  Name: Matthew Serrano. MRN: [id number] Date of Birth: 02/10/1946  Transition of Care [facility] CM/SW Contact:    Bethena Roys, RN Phone Number: 03/04/2022, 12:25 PM  Clinical Narrative: Case Manager following the patient for home IV antibiotic therapy- PTA patient states he was from home alone-has a significant other. Patient states he does have family support and daughter lives in [place]. Case Manager discussed that IV antibiotics will be ordered via [place]. OPAT order needs to be singed and secure chat sent to Cardiology MD. Ysidro Evert Liaison Pam will educate the patient and the significant other between 3-4 pm this afternoon. MD and Staff RN aware- Ysidro Evert is hopeful that the patient will transition home today. Alvis Lemmings will service the patient for home health needs and additional education/labs for IV antibiotic therapy. No further needs identified at this time.               Expected Discharge Plan: [place] Barriers to Discharge: Continued Medical Work up   Patient Goals and CMS Choice Patient states their goals for this hospitalization and ongoing recovery are:: to return home CMS Medicare.gov Compare Post Acute Care list provided to:: Patient Choice offered to / list presented to : Patient  Expected Discharge Plan and Services Expected Discharge Plan: [place] In-house Referral: NA Discharge Planning Services: CM Consult Post Acute Care Choice: Home Health, Durable Medical Equipment Living arrangements for the past 2 months: Single Family Home                 DME Arranged: IV pump/equipment DME Agency: Other - Comment Ysidro Evert) Date DME Agency Contacted: 03/04/22 Time DME Agency Contacted: 10 Representative spoke with at DME Agency: Pam HH Arranged: RN, Disease Management, IV Antibiotics HH Agency: Elizabeth Date [facility] Agency Contacted:  03/04/22 Time HH Agency Contacted: 84 Representative spoke with at [place]: [place] Arrangements/Services Living arrangements for the past 2 months: [place] with:: Self (states has family support.) Patient language and need for interpreter reviewed:: Yes Do you feel safe going back to the place where you live?: Yes      Need for Family Participation in Patient Care: Yes (Comment) Care giver support system in place?: Yes (comment)   Criminal Activity/Legal Involvement Pertinent to Current Situation/Hospitalization: No - Comment as needed  Activities of Daily Living      Permission Sought/Granted Permission sought to share information with : Case Manager, Customer service manager, Family Supports Permission granted to share information with : Yes, Verbal Permission Granted     Permission granted to share info w AGENCY: [place] [facility]        Emotional Assessment Appearance:: Appears stated age Attitude/Demeanor/Rapport: Engaged Affect (typically observed): Appropriate Orientation: : Oriented to  Time, Oriented to Place, Oriented to Self, Oriented to Situation Alcohol / Substance Use: Not Applicable Psych Involvement: No (comment)  Admission diagnosis:  Endocarditis [I38] Patient Active Problem List   Diagnosis Date Noted   Endocarditis 02/26/2022   AKI (acute kidney injury) ([place]) 02/18/2022   Precordial pain 01/26/2022   CAD (coronary artery disease) 09/09/2021   Other secondary pulmonary hypertension ([place]) 09/09/2021   Carotid artery disease ([place]) 09/09/2021   Poor dentition 06/26/2020   History of endocarditis 05/21/2020   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   S/P aortic valve allograft 05/15/2020   History of GI  bleed 05/14/2020   Hx of fistula between aorta and pulmonary artery, 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 05/14/2020   History of streptococcal septicemia 05/13/2020   (HFpEF) heart failure with preserved ejection  fraction ([place]) 05/13/2020   Malignant neoplasm of prostate ([place]) 06/23/2018   Goals of care, counseling/discussion 06/23/2018   Aortic valve disease s/p Bioprosthetic AVR, Ao Root Replacment, Repair of Ao to PA fistula 04/2020    Left inguinal hernia 09/04/2015   Alcoholism ([place]) 06/10/2014   Anemia of chronic disease 05/23/2014   HEMORRHAGE OF RECTUM AND ANUS 12/15/2008   Erectile dysfunction 12/14/2007   SEBORRHEIC KERATOSIS, INFLAMED 08/03/2007   NEOPLASM, SKIN, UNCERTAIN BEHAVIOR 77/10/4033   Hypothyroidism 07/06/2007   Diabetes mellitus ([place]) 07/06/2007   Hyperlipidemia LDL goal <70 07/06/2007   Essential hypertension 07/06/2007   PCP:  [name], NP Pharmacy:   [facility] [place], [place] [address] Phone: [phone] Fax: [phone]  [id number] [place] [place]Phone: [phone]: [phone] [place]Phone: [phone]: [phone] Readmission Risk Interventions     No data to display

## 2022-03-04 NOTE — Progress Notes (Signed)
D/C Home with PICC line for Home Health IV Infusions

## 2022-03-05 ENCOUNTER — Telehealth: Payer: Self-pay

## 2022-03-05 IMAGING — DX DG CHEST 1V PORT
1 series · 1 of 1 positions shown · non-contrast
Comparison: CT angiogram chest February 20, 2020.

CLINICAL DATA: Fever

EXAM:
PORTABLE CHEST 1 VIEW

[chest ap]
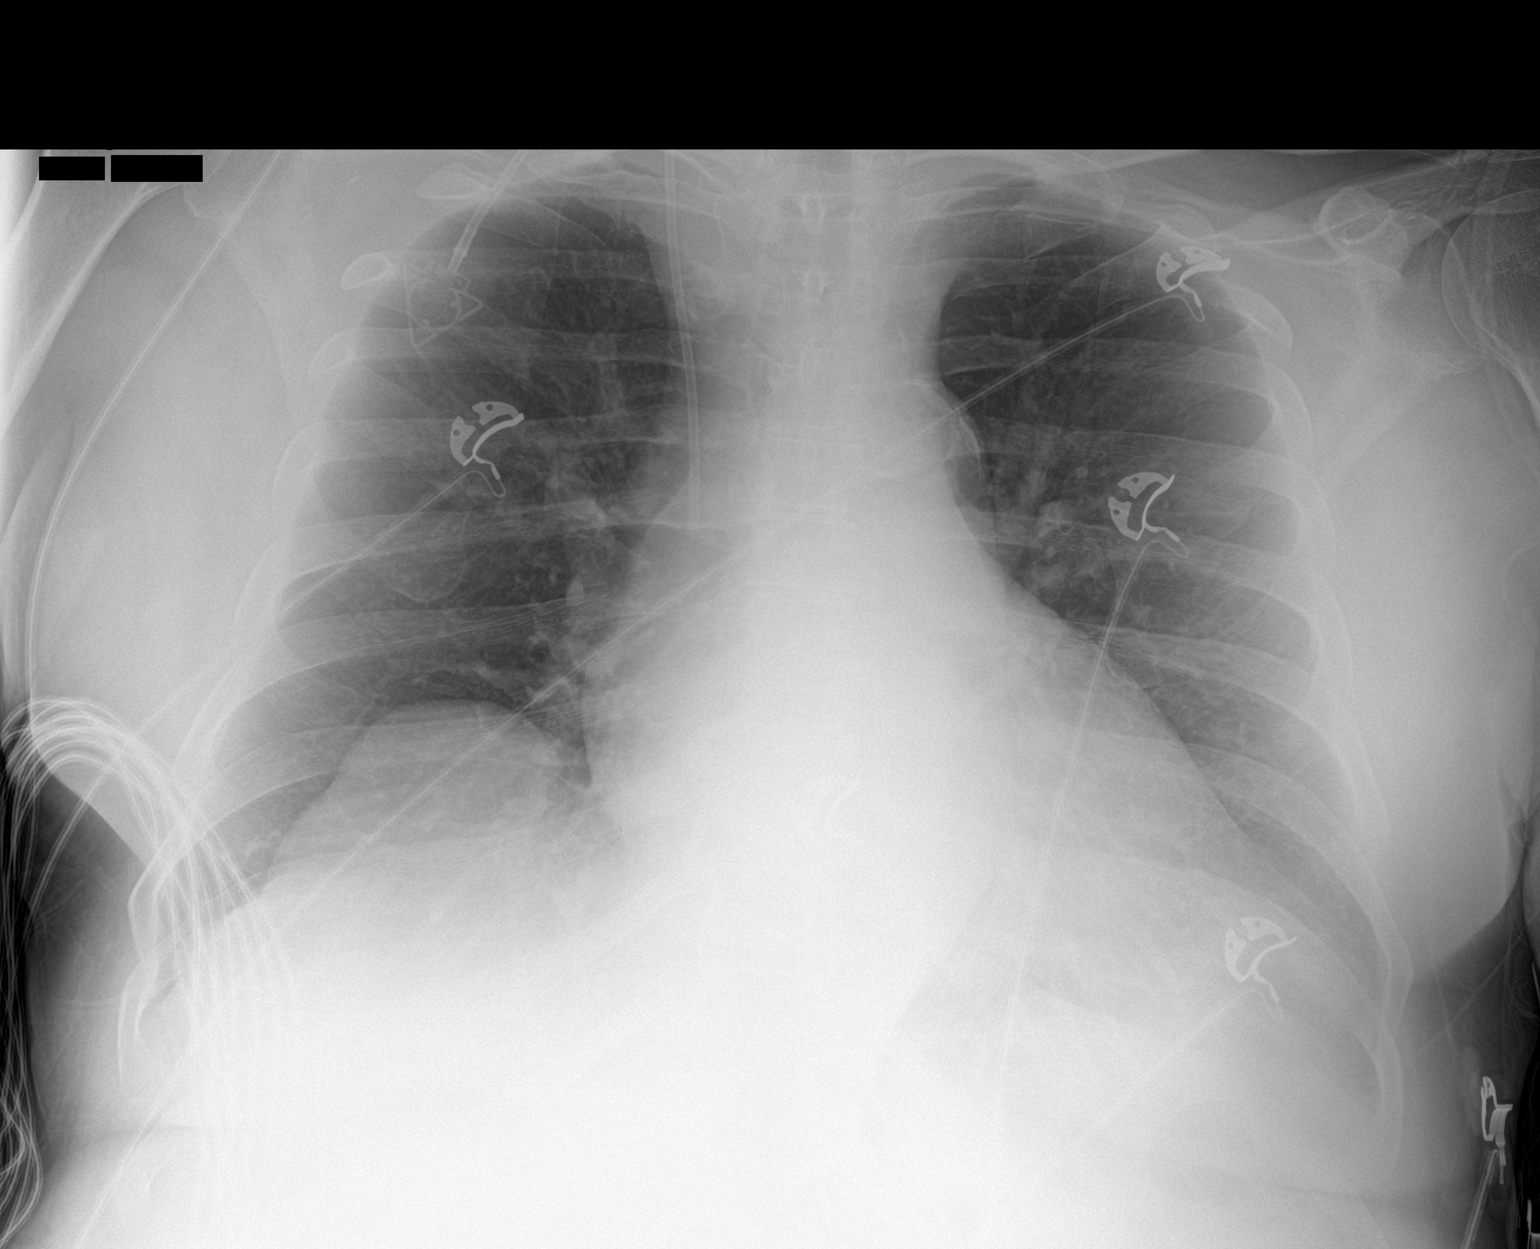

[1 of 1 positions shown; findings below may reference images not displayed]

FINDINGS: The lungs are clear. Heart size is upper normal with pulmonary
vascularity normal. No adenopathy. Port-A-Cath tip is in the
superior vena cava. No pneumothorax. There is aortic
atherosclerosis. There is eventration of the right hemidiaphragm,
unchanged.
IMPRESSION: Lungs clear. Heart upper normal in size. Port-A-Cath tip in superior
vena cava.

Aortic Atherosclerosis ([id number]).

## 2022-03-05 NOTE — Telephone Encounter (Signed)
Transition Care Management Follow-up Telephone Call Date of discharge and from where: 03/04/2022 Matthew Serrano How have you been since you were released from the hospital? Feeling good Any questions or concerns? No  Items Reviewed: Did the pt receive and understand the discharge instructions provided? Yes  Medications obtained and verified? Yes  Other? No  Any new allergies since your discharge? No  Dietary orders reviewed? Yes Do you have support at home? Yes   Home Care and Equipment/Supplies: Were home health services ordered? yes If so, what is the name of the agency? [facility]  Has the agency set up a time to come to the patient's home? yes Were any new equipment or medical supplies ordered?  No What is the name of the medical supply agency? N/a Were you able to get the supplies/equipment? not applicable Do you have any questions related to the use of the equipment or supplies? No  Functional Questionnaire: (I = Independent and D = Dependent) ADLs: I  Bathing/Dressing- I  Meal Prep- I  Eating- I  Maintaining continence- I  Transferring/Ambulation- I  Managing Meds- I  Follow up appointments reviewed:  PCP Hospital f/u appt confirmed? Yes  Scheduled to see [name] NP on 03/09/2022 @ 2:00. Are transportation arrangements needed? No  If their condition worsens, is the pt aware to call PCP or go to the Emergency Dept.? Yes Was the patient provided with contact information for the PCP's office or ED? Yes Was to pt encouraged to call back with questions or concerns? Yes

## 2022-03-05 NOTE — Telephone Encounter (Signed)
Transition Care Management Unsuccessful Follow-up Telephone Call  Date of discharge and from where:  03/04/2022 [facility]  Attempts:  1st Attempt  Reason for unsuccessful TCM follow-up call:  Left voice message

## 2022-03-05 NOTE — Telephone Encounter (Signed)
Pt is returning missed call

## 2022-03-08 ENCOUNTER — Telehealth: Payer: Self-pay | Admitting: Adult Health

## 2022-03-08 DIAGNOSIS — I358 Other nonrheumatic aortic valve disorders: Secondary | ICD-10-CM | POA: Diagnosis not present

## 2022-03-08 NOTE — Telephone Encounter (Signed)
Skilled nursing pic line care and labs  1x7

## 2022-03-09 ENCOUNTER — Inpatient Hospital Stay: Payer: Medicare PPO | Admitting: Adult Health

## 2022-03-09 ENCOUNTER — Telehealth: Payer: Self-pay | Admitting: Pharmacist

## 2022-03-09 NOTE — Progress Notes (Signed)
Chronic Care Management Pharmacy Note  03/10/2022 Name:  Matthew Serrano. MRN:  [id number] DOB:  October 06, 1945  Summary: A1c is at goal of < 7%  LDL is close to goal < 55 TSH not at goal  Recommendations/Changes made from today's visit: -Recommend decreasing or stopping doxazosin due to lack of BPH symptoms and risk for edema -Consider repeat TSH and adjusting levothyroxine dose to avoid overcorrecting  Plan: Follow up BP/DM assessment in 3 months Follow up in 6 months   Subjective: Matthew Serrano. is an 76 y.o. year old male who is a primary patient of [name], NP.  The CCM team was consulted for assistance with disease management and care coordination needs.    Engaged with patient by telephone for follow up visit in response to provider referral for pharmacy case management and/or care coordination services.   Consent to Services:  The patient was given information about Chronic Care Management services, agreed to services, and gave verbal consent prior to initiation of services.  Please see initial visit note for detailed documentation.   Patient Care Team: [name], NP as PCP - General (Family Medicine) [name], MD as PCP - Cardiology (Cardiology) [name], MD as Attending Physician (Urology) [name], [facility] as Pharmacist (Pharmacist) Sharmon Revere as Physician Assistant (Cardiology)  Recent office visits: 03/09/22 [name], NP: Patient presented for hospital follow up.  11/02/21 Glenna Durand, LPN: Patient presented for AWV.  07/30/21 [name] NP- Patient presented for chronic conditions follow up. No medication changes.  Recent consult visits: 02/19/22 Richardson Dopp PA-C (Cardiology): Patient presented for heart failure and other chronic conditions. Recommended taking spironolactone every other day due to difficulty splitting tablets. Follow up in 3 months.  01/26/22 Richardson Dopp PA-C (Cardiology): Patient  presented for heart failure and other chronic conditions. Increased omeprazole to BID. Prescribed isosorbide mononitrate 30 mg daily.  12/25/21 [name] MD (oncology): Patient presented to clinic for follow up on prostate cancer and Eligard infusion. No medication changes. Plan to start Prolia at next visit. Follow up in 4 months.   08/28/21 [name] MD (oncology) - presented to clinic for follow up on prostate cancer and Eligard infusion. No medication changes. Follow up in 4 months.  04/28/21 [name] MD ( Oncology) - presented to clinic for follow up on prostate cancer and Eligard infusion. No medication changes. Follow up in 4 months.   09/08/20 Darylene Price MD (Cardiothoracic Surgery) - seen for follow up after aortic root replacement. No medication changes and no follow up noted.   08/28/20 Michel Bickers MD (Infectious Disease) - seen for aortic valve endocarditis. No medication changes. Follow up as noted.   08/26/20 [name] MD ( Oncology) - seen for follow up for prostate cancer. No medication changes. Follow up in 4 months.   08/07/20 Michel Bickers MD ( Infectious Disease ) -  Seen for aortic valve endocarditis. No medication changes. Follow up in 3 weeks.   Hospital visits: 7/7-7/13/23 Patient admitted to [facility] for endocarditis. D/c'd metoprolol tartrate 50 mg.  Objective:  Lab Results  Component Value Date   CREATININE 1.51 (H) 03/03/2022   BUN 22 03/03/2022   GFR 58.63 (L) 07/30/2021   GFRNONAA 48 (L) 03/03/2022   GFRAA 99 07/03/2020   NA 143 03/03/2022   K 3.9 03/03/2022   CALCIUM 7.9 (L) 03/03/2022   CO2 26 03/03/2022   GLUCOSE 124 (H) 03/03/2022  Lab Results  Component Value Date/Time   HGBA1C 6.1 (H) 02/26/2022 06:16 PM   HGBA1C 6.2 07/30/2021 07:36 AM   HGBA1C 6.3 02/22/2020 09:29 AM   HGBA1C 6.2 10/23/2019 10:15 AM   GFR 58.63 (L) 07/30/2021 07:36 AM   GFR 83.11 07/26/2019 09:21 AM   MICROALBUR <0.7 04/28/2016 07:56 AM    MICROALBUR 0.8 11/02/2013 08:54 AM    Last diabetic Eye exam:  Lab Results  Component Value Date/Time   HMDIABEYEEXA borderline ocular HTN 12/03/2010 12:00 AM    Last diabetic Foot exam: No results found for: "HMDIABFOOTEX"   Lab Results  Component Value Date   CHOL 133 07/30/2021   HDL 62.00 07/30/2021   LDLCALC 56 07/30/2021   LDLDIRECT 105.0 04/28/2016   TRIG 75.0 07/30/2021   CHOLHDL 2 07/30/2021       Latest Ref Rng & Units 02/26/2022    6:16 PM 12/25/2021    8:22 AM 08/28/2021    2:34 PM  Hepatic Function  Total Protein 6.5 - 8.1 g/dL 5.9  6.4  6.2   Albumin 3.5 - 5.0 g/dL 3.7  4.0  3.9   AST 15 - 41 U/L '26  19  21   ' ALT 0 - 44 U/L '21  16  22   ' Alk Phosphatase 38 - 126 U/L 46  50  58   Total Bilirubin 0.3 - 1.2 mg/dL 0.8  0.7  0.6     Lab Results  Component Value Date/Time   TSH 0.301 (L) 02/26/2022 06:16 PM   TSH 1.51 07/30/2021 07:36 AM   TSH 1.10 06/15/2021 07:22 AM       Latest Ref Rng & Units 03/01/2022   11:33 AM 02/26/2022    6:16 PM 12/25/2021    8:22 AM  CBC  WBC 4.0 - 10.5 K/uL 4.8  5.3  6.5   Hemoglobin 13.0 - 17.0 g/dL 11.0  11.2  11.2   Hematocrit 39.0 - 52.0 % 34.3  35.1  34.8   Platelets 150 - 400 K/uL 126  149  191     No results found for: "VD25OH"  Clinical ASCVD: Yes  The 10-year ASCVD risk score (Arnett DK, et al., 2019) is: 30.5%   Values used to calculate the score:     Age: 76 years     Sex: Male     Is Non-Hispanic African American: Yes     Diabetic: Yes     Tobacco smoker: No     Systolic Blood Pressure: 903 mmHg     Is BP treated: Yes     HDL Cholesterol: 62 mg/dL     Total Cholesterol: 133 mg/dL       11/02/2021    2:37 PM 07/30/2021    7:33 AM 11/12/2020    9:16 AM  Depression screen PHQ 2/9  Decreased Interest 0 0 0  Down, Depressed, Hopeless 0 1 0  PHQ - 2 Score 0 1 0       Social History   Tobacco Use  Smoking Status Former  Smokeless Tobacco Never  Tobacco Comments   quit 2005   BP Readings from Last 3  Encounters:  03/04/22 125/72  02/25/22 113/60  02/19/22 120/62   Pulse Readings from Last 3 Encounters:  03/04/22 (!) 52  02/19/22 60  01/26/22 64   Wt Readings from Last 3 Encounters:  03/04/22 200 lb (90.7 kg)  02/19/22 200 lb 3.2 oz (90.8 kg)  02/10/22 201 lb (91.2 kg)   BMI Readings  from Last 3 Encounters:  03/04/22 30.41 kg/m  02/19/22 30.44 kg/m  02/10/22 30.56 kg/m    Assessment/Interventions: Review of patient past medical history, allergies, medications, health status, including review of consultants reports, laboratory and other test data, was performed as part of comprehensive evaluation and provision of chronic care management services.   SDOH:  (Social Determinants of Health) assessments and interventions performed: Yes   SDOH Screenings   Alcohol Screen: Low Risk  (11/12/2020)   Alcohol Screen    Last Alcohol Screening Score (AUDIT): 1  Depression (PHQ2-9): Low Risk  (11/02/2021)   Depression (PHQ2-9)    PHQ-2 Score: 0  Financial Resource Strain: Low Risk  (11/02/2021)   Overall Financial Resource Strain (CARDIA)    Difficulty of Paying Living Expenses: Not hard at all  Food Insecurity: No Food Insecurity (11/02/2021)   Hunger Vital Sign    Worried About Running Out of Food in the Last Year: Never true    [place] in the Last Year: Never true  Housing: Low Risk  (11/12/2020)   Housing    Last Housing Risk Score: 0  Physical Activity: Sufficiently Active (11/02/2021)   Exercise Vital Sign    Days of Exercise per Week: 5 days    Minutes of Exercise per Session: 30 min  Social Connections: Socially Isolated (11/12/2020)   Social Connection and Isolation Panel [NHANES]    Frequency of Communication with Friends and Family: More than three times a week    Frequency of Social Gatherings with Friends and Family: More than three times a week    Attends Religious Services: Never    Marine scientist or Organizations: No    Attends Theatre manager Meetings: Never    Marital Status: Widowed  Stress: No Stress Concern Present (11/02/2021)   [place]    Feeling of Stress : Not at all  Tobacco Use: Medium Risk (03/04/2022)   Patient History    Smoking Tobacco Use: Former    Smokeless Tobacco Use: Never    Passive Exposure: Not on file  Transportation Needs: No Transportation Needs (11/02/2021)   PRAPARE - Hydrologist (Medical): No    Lack of Transportation (Non-Medical): No    CCM Care Plan  No Known Allergies  Medications Reviewed Today     Reviewed by Anastasio Auerbach, CRNA (Certified Registered Nurse Anesthetist) on 03/03/22 at [place] List Status: Complete   Medication Order Taking? Sig Documenting Provider Last Dose Status Informant  abiraterone acetate (ZYTIGA) 250 MG tablet [id number] Yes TAKE 4 TABLETS (1,000 MG TOTAL) BY MOUTH DAILY. TAKE ON AN EMPTY STOMACH 1 HOUR BEFORE OR 2 HOURS AFTER A MEAL  Patient taking differently: Take 1,000 mg by mouth daily. Take on a empty stomach 1 hour before or 2 hours after a meal   Wyatt Portela, MD 02/26/2022 Active Self  aspirin EC 81 MG tablet [id number] Yes Take 81 mg by mouth daily. Swallow whole. [provider] 02/26/2022 Active Self  atorvastatin (LIPITOR) 80 MG tablet [id number] Yes Take 1 tablet (80 mg total) by mouth daily. [name], NP 02/26/2022 Expired 02/26/22 2359 Self  Calcium Carbonate (CALCIUM 600 PO) [id number] Yes Take 1,200 mg by mouth daily. [provider] 02/26/2022 Active Self  cholecalciferol (VITAMIN D3) 25 MCG (1000 UT) tablet [id number] Yes Take 1,000 Units by mouth daily. [provider] 02/26/2022 Active Self  doxazosin (CARDURA) 8 MG  tablet [id number] Yes TAKE 1 TABLET AT BEDTIME  Patient taking differently: Take 8 mg by mouth at bedtime.   [name], NP 02/25/2022 Active Self  ferrous sulfate 325 (65 FE) MG EC tablet  [id number] Yes Take 1 tablet (325 mg total) by mouth 2 (two) times daily before a meal. Willia Craze, NP 02/26/2022 Active Self           Med Note Broadus John, REGEENA   Tue May 13, 2020  2:46 PM)    glipiZIDE (GLUCOTROL XL) 10 MG 24 hr tablet [id number] Yes TAKE 1 TABLET TWICE DAILY  Patient taking differently: Take 10 mg by mouth in the morning and at bedtime. TAKE 1 TABLET TWICE DAILY   [name], [name], NP Past Week Active Self  labetalol (NORMODYNE) 200 MG tablet [id number] Yes TAKE 3 TABLETS EVERY MORNING AND TAKE 2 TABLETS EVERY EVENING  Patient taking differently: Take 400-600 mg by mouth See admin instructions. Take 3 tablets (600 mg) every morning and take 2 tablets (400 mg) every evening   [name], NP 02/26/2022 Active Self  levothyroxine (SYNTHROID) 125 MCG tablet [id number] Yes Take 1 tablet (125 mcg total) by mouth daily. [name], NP 02/26/2022 Active Self  metFORMIN (GLUCOPHAGE) 1000 MG tablet [id number] Yes TAKE 1 TABLET TWICE DAILY WITH MEALS (NEED MD APPOINTMENT FOR REFILLS)  Patient taking differently: Take 1,000 mg by mouth 2 (two) times daily with a meal. (Need MD appointment for refills)   [name], NP Past Week Active Self  metoprolol tartrate (LOPRESSOR) 50 MG tablet [id number] No TAKE 1 TABLET BY MOUTH 2 HOURS PRIOR TO THE CARDIAC CT  Patient not taking: Reported on 02/26/2022   Liliane Shi, PA-C Completed Course Active Self  omeprazole (PRILOSEC) 40 MG capsule [id number] Yes Take 1 tablet by mouth twice a day X's 2 weeks then reduce back to 1 tablet by mouth daily  Patient taking differently: Take 40 mg by mouth every other day.   Richardson Dopp T, PA-C Past Week Active Self  potassium chloride SA (KLOR-CON M) 20 MEQ tablet [id number] Yes Take 1 tablet (20 mEq total) by mouth daily. Richardson Dopp T, PA-C 02/25/2022 Active Self  predniSONE (DELTASONE) 5 MG tablet [id number] Yes TAKE 1 TABLET BY MOUTH ONCE A DAY WITH BREAKFAST  Patient taking differently: Take 5 mg by  mouth daily with breakfast.   Wyatt Portela, MD 02/26/2022 Active Self  spironolactone (ALDACTONE) 25 MG tablet [id number] Yes Take 0.5 tablets (12.5 mg total) by mouth daily.  Patient taking differently: Take 25 mg by mouth every other day.   Liliane Shi, PA-C Past Week Active Self  torsemide (DEMADEX) 20 MG tablet [id number] Yes Take 1 tablet (20 mg total) by mouth daily. [name], MD 02/26/2022 Active Self            Patient Active Problem List   Diagnosis Date Noted   Endocarditis 02/26/2022   AKI (acute kidney injury) ([place]) 02/18/2022   Precordial pain 01/26/2022   CAD (coronary artery disease) 09/09/2021   Other secondary pulmonary hypertension ([place]) 09/09/2021   Carotid artery disease ([place]) 09/09/2021   Poor dentition 06/26/2020   History of endocarditis 05/21/2020   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   S/P aortic valve allograft 05/15/2020   History of GI bleed 05/14/2020   Hx of fistula between aorta and pulmonary artery, 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 05/14/2020   History of streptococcal septicemia 05/13/2020   (HFpEF) heart failure with preserved ejection fraction ([place])  05/13/2020   Malignant neoplasm of prostate ([place]) 06/23/2018   Goals of care, counseling/discussion 06/23/2018   Aortic valve disease s/p Bioprosthetic AVR, Ao Root Replacment, Repair of Ao to PA fistula 04/2020    Left inguinal hernia 09/04/2015   Alcoholism ([place]) 06/10/2014   Anemia of chronic disease 05/23/2014   HEMORRHAGE OF RECTUM AND ANUS 12/15/2008   Erectile dysfunction 12/14/2007   SEBORRHEIC KERATOSIS, INFLAMED 08/03/2007   NEOPLASM, SKIN, UNCERTAIN BEHAVIOR 09/32/3557   Hypothyroidism 07/06/2007   Diabetes mellitus ([place]) 07/06/2007   Hyperlipidemia LDL goal <70 07/06/2007   Essential hypertension 07/06/2007    Immunization History  Administered Date(s) Administered   Fluad Quad(high Dose 65+) 07/26/2019, 07/29/2020, 07/30/2021   Influenza Split 07/12/2011    Influenza Whole 05/23/2008, 06/13/2009, 05/11/2010   Influenza, High Dose Seasonal PF 05/03/2016, 05/24/2018   Influenza,inj,Quad PF,6+ Mos 05/16/2014   PFIZER(Purple Top)SARS-COV-2 Vaccination 10/21/2019, 11/20/2019, 07/25/2020   Pneumococcal Conjugate-13 11/13/2013   Pneumococcal Polysaccharide-23 06/02/2000, 12/24/2004, 05/03/2016   Td 01/05/1996, 01/12/2007   Zoster Recombinat (Shingrix) 01/06/2022   Patient reports he accidentally fell asleep and slept all day yesterday and missed his appointment with his PCP. Patient is able to move around without problems.  Patient reports he is still having swelling since discharge. He is doing less cooking now and eating more [place] at [employer] rather than other saltier deli meats, but he doesn't go that often.  He was walking every day prior to the hospital and lifting weights and he didn't feel bad but is aware of the severity of the endocarditis. He reports he does sweat often but he thinks this is because of the cancer medication because he can sweat just sitting down.  Conditions to be addressed/monitored:  Hypertension, Hyperlipidemia, Diabetes, Heart Failure, GERD, Hypothyroidism, and ED and cancer  Conditions addressed this visit: Hypertension, diabetes, heart failure  Care Plan : [place]  Updates made by [name], [place] since 03/10/2022 12:00 AM     Problem: Problem: Hypertension, Hyperlipidemia, Diabetes, Heart Failure, GERD, Hypothyroidism, and ED and cancer      Long-Range Goal: Patient-Specific Goal   Start Date: 01/29/2021  Expected End Date: 01/29/2022  Recent Progress: On track  Priority: High  Note:   Current Barriers:  Unable to independently monitor therapeutic efficacy Suboptimal therapeutic regimen for blood pressure  Pharmacist Clinical Goal(s):  Patient will achieve adherence to monitoring guidelines and medication adherence to achieve therapeutic efficacy maintain control of cholesterol as  evidenced by next lipid panel  through collaboration with PharmD and provider.   Interventions: 1:1 collaboration with [name], NP regarding development and update of comprehensive plan of care as evidenced by provider attestation and co-signature Inter-disciplinary care team collaboration (see longitudinal plan of care) Comprehensive medication review performed; medication list updated in electronic medical record  Hypertension (BP goal <140/90) -Controlled -Current treatment: Doxazosin 8 mg 1 tablet at bedtime - Query Appropriate, Effective, Query Safe, Accessible Labetalol 200 mg 3 tablets every morning and 2 tablets at bedtime - Appropriate, Effective, Safe, Accessible Spironolactone 25 mg 1 tablet every other day - Appropriate, Effective, Safe, Accessible -Medications previously tried: clonidine (no longer needed) -Current home readings: 120/75, 113/64 this morning (checking every day) -Current dietary habits: doesn't add any salt to his food; doesn't look at package labels; eating some lunch meats; gets canned vegetables (he does rinse them) -Current exercise habits: does not exercise routinely -Denies hypotensive/hypertensive symptoms -Educated on Importance of home blood pressure monitoring; Proper BP monitoring technique; Symptoms of  hypotension and importance of maintaining adequate hydration; -Counseled to monitor BP at home weekly, document, and provide log at future appointments -Counseled on diet and exercise extensively Recommended to continue current medication Recommended setting a goal of walking for 15 minutes 3 days a week.  Hyperlipidemia: (LDL goal < 55) -Not ideally controlled -Current treatment: Atorvastatin 80 mg 1 tablet daily - Appropriate, Effective, Safe, Accessible -Medications previously tried: none  -Current dietary patterns: mostly eating baked or boiled foods; cooks with vegetable oil and recommended olive or canola -Current exercise habits: no  structured exercise -Educated on Cholesterol goals;  Benefits of statin for ASCVD risk reduction; Importance of limiting foods high in cholesterol; Exercise goal of 150 minutes per week; -Counseled on diet and exercise extensively Recommended to continue current medication  Diabetes (A1c goal <7%) -Controlled -Current medications: Glipizide 10 mg 1 tablet twice daily - Appropriate, Effective, Safe, Accessible Metformin 1000 mg 1 tablet twice daily - Appropriate, Effective, Safe, Accessible -Medications previously tried: none  -Current home glucose readings fasting glucose: 115 (twice a week) post prandial glucose: 186  -Reports hypoglycemic/hyperglycemic symptoms -Current meal patterns:  breakfast: grits or cereal and 1 egg  lunch: n/a  dinner: n/a snacks: ice cream drinks: n/a -Current exercise: no structured exercise -Educated on A1c and blood sugar goals; Exercise goal of 150 minutes per week; Carbohydrate counting and/or plate method Counseled to check feet daily and get yearly eye exams -Counseled to check feet daily and get yearly eye exams -Counseled on diet and exercise extensively Recommended to continue current medication  CAD (Goal: prevent heart events) -Controlled -Current treatment  Aspirin 81 mg 1 tablet daily - Appropriate, Effective, Safe, Accessible Atorvastatin 80 mg 1 tablet daily - Appropriate, Effective, Safe, Accessible -Medications previously tried: none  -Counseled on limiting NSAID use with aspirin Educated on monitoring for signs of bleeding such as unexplained and excessive bleeding from a cut or injury, easy or excessive bruising, blood in urine or stools, and nosebleeds without a known cause  Heart Failure (Goal: manage symptoms and prevent exacerbations) -Controlled -Last ejection fraction: 55-60% (Date: 12/24/20) -HF type: Diastolic -NYHA Class: II (slight limitation of activity) -AHA HF Stage: C (Heart disease and symptoms  present) -Current treatment: Torsemide 20 mg 1 tablet  daily - Appropriate, Query effective, Safe, Accessible Spironolactone 25 mg 1 tablet every other day - Appropriate, Effective, Safe, Accessible -Medications previously tried: none  -Current home BP/HR readings: refer to above -Current dietary habits: switched to [place] from salami/pepperoni -Current exercise habits: limited activity -Educated on Importance of weighing daily; if you gain more than 3 pounds in one day or 5 pounds in one week, call cardiologist Proper diuretic administration and potassium supplementation Importance of blood pressure control -Counseled on diet and exercise extensively Recommended to continue current medication  Anemia (Goal: ferritin 24-380) -Controlled -Current treatment  Ferrous sulfate 325 mg 1 tablet twice daily - Appropriate, Effective, Safe, Accessible -Medications previously tried: none  -Recommended to continue current medication  Hypothyroidism (Goal: 0.35-4.5) -Uncontrolled -Current treatment  Levothyroxine 125 mcg 1 tablet daily - Appropriate, Effective, Safe, Accessible -Medications previously tried: none  -Counseled on importance of taking on an empty stomach and separating from iron tablet by 4 hours or more  Prostate cancer (Goal: remission) -Controlled -Current treatment  Zytiga 250 mg 4 tablets daily - Appropriate, Effective, Safe, Accessible Prednisone 5 mg 1 tablet daily - Appropriate, Effective, Safe, Accessible -Medications previously tried: none  -Recommended to continue current medication Recommended DEXA. Patient acquires through patient  assistance program.  Stomach protection (Goal: prevent stomach bleeds) -Controlled -Current treatment  Omeprazole 40 mg 1 capsule every day - Appropriate, Effective, Query Safe, Accessible -Medications previously tried: none  -Educated on use for protection of stomach. Will reassess the need for it at follow up.   Health  Maintenance -Vaccine gaps: shingrix, COVID booster -Current therapy:  Vitamin D3 1000 units daily Amoxicillin 500 mg 4 tablets prior to dental visits -Educated on Cost vs benefit of each product must be carefully weighed by individual consumer -Patient is satisfied with current therapy and denies issues -Recommended to continue current medication  Patient Goals/Self-Care Activities Patient will:  - take medications as prescribed check glucose a few times a week, document, and provide at future appointments check blood pressure weekly, document, and provide at future appointments target a minimum of 150 minutes of moderate intensity exercise weekly  Follow Up Plan: Telephone follow up appointment with care management team member scheduled for: 6 months      Medication Assistance: None required.  Patient affirms current coverage meets needs.   Compliance/Adherence/Medication fill history: Care Gaps: Shingrix, eye exam, COVID booster, foot exam Last BP - 120/62 on 02/19/2022 Last A1C - 6.1 on 02/26/2022  Star-Rating Drugs: Atorvastatin 80 mg - last filled 01/19/2022 90 DS at [facility] Glipizide 10 mg - last filled 01/19/2022 90 DS at [facility] Metformin 1000 mg - last filled 01/19/2022 90 DS at [facility]  Patient's preferred pharmacy is:  [facility], [place] [address] Phone: [phone] Fax: [phone]  [id number] [place] [place]Phone: [phone]: [phone] [place]Phone: [phone]: [phone] [place] [address]Phone: [phone]: [phone]  Uses pill box? Yes - weekly (one time slot) - early morning he doesn't put in a pillbox (3 days at a time) Pt endorses  99% compliance   We discussed: Current pharmacy is preferred with insurance plan and patient is satisfied with pharmacy services Patient decided to: Continue current medication management strategy  Care Plan and Follow Up Patient Decision:  Patient agrees to Care Plan and Follow-up.  Plan: Telephone follow up appointment with care management team member scheduled for:  6 months  MJeni Salles PharmD, BPiruPharmacist LLindenat BWeyers [phone]

## 2022-03-09 NOTE — Telephone Encounter (Signed)
Left message to return phone call.

## 2022-03-09 NOTE — Progress Notes (Deleted)
   Subjective:    Patient ID: Matthew Serrano., male    DOB: [birth year], 76 y.o.   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  He presents to the office today for TCM visit   Admit Date 02/26/2022 Discharge Date 03/04/2022     Hospital Course  Aortic Valve Endocarditis s/p AVR 2021  -Echocardiogram on 621 showed atypical location of the aortic valve and LVOT.  It also showed evidence of turbulent flow and trivial perivalvular leak with mild central valve aortic regurgitation.  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 -He was seen by ID while admitted and treated with ceftriaxone and daptomycin.  Blood cultures from 02/26/2022 showed no growth at 5 days. -He was also seen by CT surgery and they were concerned that patient would be an extremely high risk patient.  It is unsure if her would be any way to reconstruct LV outflow track. -TEE on 03/03/2022 showed the patient was status post 23 mm human allograft aortic root repair.  There was a pseudoaneurysm present below the level of the RCC and LCC cups extending into the LVOT.  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  Also noted was persistent flow from the aortic root into the main PA just above the level of the pulmonic valve consistent with a persistent aortopulmonary fistula -He had a PICC line placed on 03/04/2022 for IV antibiotics -He was discharged on IV ceftriaxone 2 g daily until 04/09/2022.  Home health had been arranged -He was advised to follow-up with Dr. Burt Knack within the next 2 weeks  Non obstructive CAD  -No angina.  Coronary CT FFR negative.  He was continued on aspirin, labetalol, and Lipitor  HFpEF -Recent echo with EF 60 to 65%, moderate LVH.  Continue torsemide and Aldactone  Hyperlipidemia -Continue Lipitor  Hypothyroidism -Continue Synthroid  Metastatic prostate cancer -Continue home regiment   Review of Systems     Objective:   Physical Exam        Assessment & Plan:

## 2022-03-09 NOTE — Chronic Care Management (AMB) (Signed)
    Chronic Care Management Pharmacy Assistant   Name: Matthew Serrano.  MRN: [id number] DOB: 1946/06/16  03/10/2022 APPOINTMENT Matthew Sundeen Jr. was reminded to have all medications, supplements and any blood glucose and blood pressure readings available for review with Jeni Salles, Pharm. D, at his telephone visit on 03/10/2022 at 10:30.  Care Gaps: AWV - scheduled 11/08/2022 Last BP - 120/62 on 02/19/2022 Last A1C - 6.1 on 02/26/2022 Eye exam - overdue Covid booster - overdue Foot exam - overdue Shingrix - overdue  Star Rating Drug: Atorvastatin 80 mg - last filled 01/19/2022 90 DS at [facility] Glipizide 10 mg - last filled 01/19/2022 90 DS at [facility] Metformin 1000 mg - last filled 01/19/2022 90 DS at [facility]  Any gaps in medications fill history? No  Gennie Alma [facility]  Catering manager [phone]

## 2022-03-10 ENCOUNTER — Ambulatory Visit (INDEPENDENT_AMBULATORY_CARE_PROVIDER_SITE_OTHER): Payer: Medicare PPO | Admitting: Pharmacist

## 2022-03-10 DIAGNOSIS — E1169 Type 2 diabetes mellitus with other specified complication: Secondary | ICD-10-CM

## 2022-03-10 DIAGNOSIS — I10 Essential (primary) hypertension: Secondary | ICD-10-CM

## 2022-03-10 NOTE — Patient Instructions (Signed)
Hi Devan,  It was great to catch up with you again! I am glad you are doing better.  Please reach out to me if you have any questions or need anything before our follow up!  Best, Maddie  Jeni Salles, PharmD, [place] Pharmacist [place] [facility] [place]   Visit Information   Goals Addressed   None    Care Plan : [place]  Updates made by [name], [facility] since 03/10/2022 12:00 AM     Problem: Problem: Hypertension, Hyperlipidemia, Diabetes, Heart Failure, GERD, Hypothyroidism, and ED and cancer      Long-Range Goal: Patient-Specific Goal   Start Date: 01/29/2021  Expected End Date: 01/29/2022  Recent Progress: On track  Priority: High  Note:   Current Barriers:  Unable to independently monitor therapeutic efficacy Suboptimal therapeutic regimen for blood pressure  Pharmacist Clinical Goal(s):  Patient will achieve adherence to monitoring guidelines and medication adherence to achieve therapeutic efficacy maintain control of cholesterol as evidenced by next lipid panel  through collaboration with PharmD and provider.   Interventions: 1:1 collaboration with [name], NP regarding development and update of comprehensive plan of care as evidenced by provider attestation and co-signature Inter-disciplinary care team collaboration (see longitudinal plan of care) Comprehensive medication review performed; medication list updated in electronic medical record  Hypertension (BP goal <140/90) -Controlled -Current treatment: Doxazosin 8 mg 1 tablet at bedtime - Query Appropriate, Effective, Query Safe, Accessible Labetalol 200 mg 3 tablets every morning and 2 tablets at bedtime - Appropriate, Effective, Safe, Accessible Spironolactone 25 mg 1 tablet every other day - Appropriate, Effective, Safe, Accessible -Medications previously tried: clonidine (no longer needed) -Current home readings: 120/75, 113/64 this morning  (checking every day) -Current dietary habits: doesn't add any salt to his food; doesn't look at package labels; eating some lunch meats; gets canned vegetables (he does rinse them) -Current exercise habits: does not exercise routinely -Denies hypotensive/hypertensive symptoms -Educated on Importance of home blood pressure monitoring; Proper BP monitoring technique; Symptoms of hypotension and importance of maintaining adequate hydration; -Counseled to monitor BP at home weekly, document, and provide log at future appointments -Counseled on diet and exercise extensively Recommended to continue current medication Recommended setting a goal of walking for 15 minutes 3 days a week.  Hyperlipidemia: (LDL goal < 55) -Not ideally controlled -Current treatment: Atorvastatin 80 mg 1 tablet daily - Appropriate, Effective, Safe, Accessible -Medications previously tried: none  -Current dietary patterns: mostly eating baked or boiled foods; cooks with vegetable oil and recommended olive or canola -Current exercise habits: no structured exercise -Educated on Cholesterol goals;  Benefits of statin for ASCVD risk reduction; Importance of limiting foods high in cholesterol; Exercise goal of 150 minutes per week; -Counseled on diet and exercise extensively Recommended to continue current medication  Diabetes (A1c goal <7%) -Controlled -Current medications: Glipizide 10 mg 1 tablet twice daily - Appropriate, Effective, Safe, Accessible Metformin 1000 mg 1 tablet twice daily - Appropriate, Effective, Safe, Accessible -Medications previously tried: none  -Current home glucose readings fasting glucose: 115 (twice a week) post prandial glucose: 186  -Reports hypoglycemic/hyperglycemic symptoms -Current meal patterns:  breakfast: grits or cereal and 1 egg  lunch: n/a  dinner: n/a snacks: ice cream drinks: n/a -Current exercise: no structured exercise -Educated on A1c and blood sugar  goals; Exercise goal of 150 minutes per week; Carbohydrate counting and/or plate method Counseled to check feet daily and get yearly eye exams -Counseled to check feet daily and  get yearly eye exams -Counseled on diet and exercise extensively Recommended to continue current medication  CAD (Goal: prevent heart events) -Controlled -Current treatment  Aspirin 81 mg 1 tablet daily - Appropriate, Effective, Safe, Accessible Atorvastatin 80 mg 1 tablet daily - Appropriate, Effective, Safe, Accessible -Medications previously tried: none  -Counseled on limiting NSAID use with aspirin Educated on monitoring for signs of bleeding such as unexplained and excessive bleeding from a cut or injury, easy or excessive bruising, blood in urine or stools, and nosebleeds without a known cause  Heart Failure (Goal: manage symptoms and prevent exacerbations) -Controlled -Last ejection fraction: 55-60% (Date: 12/24/20) -HF type: Diastolic -NYHA Class: II (slight limitation of activity) -AHA HF Stage: C (Heart disease and symptoms present) -Current treatment: Torsemide 20 mg 1 tablet daily - Appropriate, Query effective, Safe, Accessible Spironolactone 25 mg 1 tablet every other day - Appropriate, Effective, Safe, Accessible -Medications previously tried: none  -Current home BP/HR readings: refer to above -Current dietary habits: switched to [place] from salami/pepperoni -Current exercise habits: limited activity -Educated on Importance of weighing daily; if you gain more than 3 pounds in one day or 5 pounds in one week, call cardiologist Proper diuretic administration and potassium supplementation Importance of blood pressure control -Counseled on diet and exercise extensively Recommended to continue current medication  Anemia (Goal: ferritin 24-380) -Controlled -Current treatment  Ferrous sulfate 325 mg 1 tablet twice daily - Appropriate, Effective, Safe, Accessible -Medications previously tried:  none  -Recommended to continue current medication  Hypothyroidism (Goal: 0.35-4.5) -Uncontrolled -Current treatment  Levothyroxine 125 mcg 1 tablet daily - Appropriate, Effective, Safe, Accessible -Medications previously tried: none  -Counseled on importance of taking on an empty stomach and separating from iron tablet by 4 hours or more  Prostate cancer (Goal: remission) -Controlled -Current treatment  Zytiga 250 mg 4 tablets daily - Appropriate, Effective, Safe, Accessible Prednisone 5 mg 1 tablet daily - Appropriate, Effective, Safe, Accessible -Medications previously tried: none  -Recommended to continue current medication Recommended DEXA. Patient acquires through patient assistance program.  Stomach protection (Goal: prevent stomach bleeds) -Controlled -Current treatment  Omeprazole 40 mg 1 capsule every day - Appropriate, Effective, Query Safe, Accessible -Medications previously tried: none  -Educated on use for protection of stomach. Will reassess the need for it at follow up.   Health Maintenance -Vaccine gaps: shingrix, COVID booster -Current therapy:  Vitamin D3 1000 units daily Amoxicillin 500 mg 4 tablets prior to dental visits -Educated on Cost vs benefit of each product must be carefully weighed by individual consumer -Patient is satisfied with current therapy and denies issues -Recommended to continue current medication  Patient Goals/Self-Care Activities Patient will:  - take medications as prescribed check glucose a few times a week, document, and provide at future appointments check blood pressure weekly, document, and provide at future appointments target a minimum of 150 minutes of moderate intensity exercise weekly  Follow Up Plan: Telephone follow up appointment with care management team member scheduled for: 6 months         Patient verbalizes understanding of instructions and care plan provided today and agrees to view in [place]. Active  MyChart status and patient understanding of how to access instructions and care plan via MyChart confirmed with patient.    Telephone follow up appointment with pharmacy team member scheduled for: 6 months  [name], [facility]

## 2022-03-11 ENCOUNTER — Other Ambulatory Visit (HOSPITAL_COMMUNITY): Payer: Self-pay

## 2022-03-11 NOTE — Telephone Encounter (Signed)
Spoke with April and informed her of the approval for orders as below.

## 2022-03-15 ENCOUNTER — Other Ambulatory Visit (HOSPITAL_COMMUNITY): Payer: Self-pay

## 2022-03-15 ENCOUNTER — Other Ambulatory Visit: Payer: Self-pay | Admitting: Oncology

## 2022-03-15 DIAGNOSIS — I358 Other nonrheumatic aortic valve disorders: Secondary | ICD-10-CM | POA: Diagnosis not present

## 2022-03-15 DIAGNOSIS — C61 Malignant neoplasm of prostate: Secondary | ICD-10-CM

## 2022-03-16 ENCOUNTER — Other Ambulatory Visit: Payer: Self-pay

## 2022-03-16 ENCOUNTER — Other Ambulatory Visit: Payer: Self-pay | Admitting: *Deleted

## 2022-03-16 ENCOUNTER — Encounter: Payer: Self-pay | Admitting: Adult Health

## 2022-03-16 ENCOUNTER — Other Ambulatory Visit (HOSPITAL_COMMUNITY): Payer: Self-pay

## 2022-03-16 ENCOUNTER — Ambulatory Visit: Payer: Medicare PPO | Admitting: Internal Medicine

## 2022-03-16 ENCOUNTER — Encounter: Payer: Self-pay | Admitting: Internal Medicine

## 2022-03-16 ENCOUNTER — Ambulatory Visit (INDEPENDENT_AMBULATORY_CARE_PROVIDER_SITE_OTHER): Payer: Medicare PPO | Admitting: Adult Health

## 2022-03-16 VITALS — BP 133/78 | HR 58 | Temp 98.3°F | Wt 199.0 lb

## 2022-03-16 VITALS — BP 128/80 | HR 62 | Temp 99.3°F | Ht 68.0 in | Wt 197.0 lb

## 2022-03-16 DIAGNOSIS — E02 Subclinical iodine-deficiency hypothyroidism: Secondary | ICD-10-CM | POA: Diagnosis not present

## 2022-03-16 DIAGNOSIS — I5032 Chronic diastolic (congestive) heart failure: Secondary | ICD-10-CM | POA: Diagnosis not present

## 2022-03-16 DIAGNOSIS — I10 Essential (primary) hypertension: Secondary | ICD-10-CM

## 2022-03-16 DIAGNOSIS — C61 Malignant neoplasm of prostate: Secondary | ICD-10-CM

## 2022-03-16 DIAGNOSIS — I2510 Atherosclerotic heart disease of native coronary artery without angina pectoris: Secondary | ICD-10-CM | POA: Diagnosis not present

## 2022-03-16 DIAGNOSIS — E782 Mixed hyperlipidemia: Secondary | ICD-10-CM

## 2022-03-16 DIAGNOSIS — R600 Localized edema: Secondary | ICD-10-CM

## 2022-03-16 DIAGNOSIS — I358 Other nonrheumatic aortic valve disorders: Secondary | ICD-10-CM

## 2022-03-16 DIAGNOSIS — I330 Acute and subacute infective endocarditis: Secondary | ICD-10-CM | POA: Diagnosis not present

## 2022-03-16 MED ORDER — ABIRATERONE ACETATE 250 MG PO TABS
ORAL_TABLET | Freq: Every day | ORAL | 0 refills | Status: DC
  Filled 2022-03-16: qty 120, 30d supply, fill #0

## 2022-03-16 NOTE — Patient Instructions (Signed)
It was great seeing you today and am glad you are feeling well   I am going to check some blood work today   Stop the doxazosin to see if this helps with your leg swelling

## 2022-03-16 NOTE — Addendum Note (Signed)
Addended byLaurice Record on: 03/16/2022 09:49 AM   Modules accepted: Level of Service

## 2022-03-16 NOTE — Progress Notes (Addendum)
Subjective:    Patient ID: Matthew Serrano., male    DOB: 1945/11/18, 76 y.o.   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  He presents to the office today for TCM visit   Admit Date 02/26/2022 Discharge Date 03/04/2022  He was directly admitted from home to [facility] on 02/26/2022 for evaluation of endocarditis of prosthesis.   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  Thickening at the base of the RCC/LCC leaflets which may represent vegetation.  He has a history of AV endocarditis with strep status post AVR on 05/14/2020 which was treated with ceftriaxone x6 weeks.  Hospital Course   Aortic Valve Endocarditis - s/p AVR 2021  -He was seen by ID while admitted and treated with ceftriaxone and daptomycin.  His blood cultures showed no growth at 5 days.  He was also seen by CT surgery, per their notes they were concerned the patient would be an extremely high risk patient and they are unsure if there would be a way to reconstruct LV outflow tract.  His TEE on 03/03/2022 showed that he was status post 23 mm human allograft aortic root repair.  There was a pseudoaneurysm present below the level of the RCC and LCC cusp extending into the LVOT.  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   Also noted was persistent flow from the aortic root into the main PA just above the level of the pulmonic valve consistent with persistent aorta pulmonary fistula -He had a PICC line placed on 713 for IV antibiotics and discharged on IV ceftriaxone 2 g daily until 04/09/2022.    Non obstructive CAD  - No angina - Coronary CT FFR negative  - Continue ASA, lipitor, labetalol   HFpEF -Recent echo with EF 60 to 65%, moderate LVH -Continue torsemide and Aldactone  Hyperlipidemia  - Continue lipitor   Hypothyroidism  - continue synthroid  - TSH during hospital admission Lab Results  Component Value Date   TSH 0.301 (L) 02/26/2022  - Will recheck today   Metastatic Prostate Cancer  - Continue home regimen   He reports that since being discharged from the hospital he is feeling at baseline.  Denies fevers, chills, nausea, vomiting, diarrhea, chest pain, or shortness of breath.  He was seen by infectious disease earlier today and was advised to continue with ceftriaxone and follow-up with ID after cardiology appointment on 03/24/2022.  He also talked to our clinical pharmacist last week.  Per her recommendations, he has been on doxazosin for many years due to to elevated blood pressure readings.  He has had some lower extremity edema.  Pharmacy recommendations was to stop doxazosin as blood pressure was well controlled to see if this helped with his swelling.  Review of Systems  Constitutional: Negative.   HENT: Negative.    Eyes: Negative.   Respiratory: Negative.    Cardiovascular:  Positive for leg swelling.  Gastrointestinal:  Negative.   Endocrine: Negative.   Genitourinary: Negative.   Musculoskeletal: Negative.   Skin: Negative.   Allergic/Immunologic: Negative.   Neurological: Negative.   Hematological: Negative.   Psychiatric/Behavioral: Negative.    All other systems reviewed and are negative.  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[place]) 05/14/2020   History of nuclear stress test    Myoview 8/18: EF 64, inferobasal thinning, no ischemia, low risk   HYPERLIPIDEMIA 07/06/2007   HYPERTENSION 07/06/2007   HYPOTHYROIDISM 07/06/2007   Leg pain    ABIs 8/18:  Normal    Obesity    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   23 mm human aortic root graft with 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 and reimplantation of left main and right coronary arteries   Severe aortic insufficiency 05/13/2020   acute onset in setting of bacterial endocarditis   Severe aortic stenosis     Social History   Socioeconomic History   Marital status: Widowed    Spouse name: Not on file   Number of children: Not on file   Years of education: Not on file   Highest education level: Not on file  Occupational History   Not on file  Tobacco Use   Smoking status: Former   Smokeless tobacco: Never   Tobacco comments:    quit 2005  Vaping Use   Vaping Use: Never used  Substance and Sexual Activity   Alcohol use: Not Currently    Alcohol/week: 14.0 standard drinks of alcohol    Types: 14 Cans of beer per week   Drug use: No   Sexual activity: Not on file  Other Topics Concern   Not on file  Social History Narrative   He works as a Freight forwarder    Not married    No kids          Social Determinants of Health   Financial Resource Strain: Low Risk  (11/02/2021)   Overall Financial Resource Strain (CARDIA)    Difficulty of Paying Living Expenses: Not hard at all  Food Insecurity: No Food Insecurity (11/02/2021)   Hunger Vital Sign    Worried About Running Out of Food in the Last Year: Never true    [place] in the Last Year: Never true  Transportation  Needs: No Transportation Needs (11/02/2021)   PRAPARE - Hydrologist (Medical): No    Lack of Transportation (Non-Medical): No  Physical Activity: Sufficiently Active (11/02/2021)   Exercise Vital Sign    Days of Exercise per Week: 5 days    Minutes of Exercise per Session: 30 min  Stress: No Stress Concern Present (11/02/2021)   [place]    Feeling of Stress : Not at all  Social Connections: Socially Isolated (11/12/2020)   Social Connection and Isolation Panel [NHANES]    Frequency of Communication with Friends and Family: More than three times a week    Frequency of Social Gatherings with Friends and Family: More than three times a week    Attends Religious Services: Never    Marine scientist or Organizations:  No    Attends Archivist Meetings: Never    Marital Status: Widowed  Intimate Partner Violence: Not At Risk (11/12/2020)   Humiliation, Afraid, Rape, and Kick questionnaire    Fear of Current or Ex-Partner: No    Emotionally Abused: No    Physically Abused: No    Sexually Abused: No    Past Surgical History:  Procedure Laterality Date   AORTIC VALVE REPLACEMENT N/A 05/15/2020   Procedure: AORTIC VALVE REPLACEMENT (AVR) SIZE 23 MM,  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;  Surgeon: Rexene Alberts, MD;  Location: [place];  Service: Open Heart Surgery;  Laterality: N/A;   ASCENDING AORTIC ROOT REPLACEMENT N/A 05/15/2020   Procedure: HUMAN ALLOGRAFT AORTIC ROOT REPLACEMENT;  Surgeon: Rexene Alberts, MD;  Location: [place];  Service: Open Heart Surgery;  Laterality: N/A;   BUBBLE STUDY  03/03/2022   Procedure: BUBBLE STUDY;  Surgeon: [name], MD;  Location: [place];  Service: Cardiovascular;;   COLONOSCOPY     COLONOSCOPY N/A 05/03/2020   Procedure: COLONOSCOPY;  Surgeon: Juanita Craver, MD;  Location: [facility];  Service: Endoscopy;  Laterality:  N/A;   COLONOSCOPY W/ BIOPSIES AND POLYPECTOMY     ENTEROSCOPY N/A 05/02/2020   Procedure: ENTEROSCOPY;  Surgeon: Rush Landmark Telford Nab., MD;  Location: [facility];  Service: Gastroenterology;  Laterality: N/A;   HEMOSTASIS CLIP PLACEMENT  05/03/2020   Procedure: HEMOSTASIS CLIP PLACEMENT;  Surgeon: Juanita Craver, MD;  Location: [facility];  Service: Endoscopy;;   HOT HEMOSTASIS N/A 05/03/2020   Procedure: HOT HEMOSTASIS (ARGON PLASMA COAGULATION/BICAP);  Surgeon: Juanita Craver, MD;  Location: [facility];  Service: Endoscopy;  Laterality: N/A;   IR IMAGING GUIDED PORT INSERTION  07/05/2018   POLYPECTOMY     PORT-A-CATH REMOVAL Right 05/20/2020   Procedure: REMOVAL PORT-A-CATH;  Surgeon: Rexene Alberts, MD;  Location: [facility] OR;  Service: Thoracic;  Laterality: Right;   RIGHT/LEFT HEART CATH AND CORONARY ANGIOGRAPHY N/A 02/13/2020   Procedure: RIGHT/LEFT HEART CATH AND CORONARY ANGIOGRAPHY;  Surgeon: Belva Crome, MD;  Location: [place] [facility];  Service: Cardiovascular;  Laterality: N/A;   SUBMUCOSAL INJECTION  05/02/2020   Procedure: SUBMUCOSAL INJECTION;  Surgeon: Rush Landmark Telford Nab., MD;  Location: [facility];  Service: Gastroenterology;;   TEE WITHOUT CARDIOVERSION N/A 05/14/2020   Procedure: TRANSESOPHAGEAL ECHOCARDIOGRAM (TEE);  Surgeon: Sanda Klein, MD;  Location: [place];  Service: Cardiovascular;  Laterality: N/A;   TEE WITHOUT CARDIOVERSION N/A 05/15/2020   Procedure: TRANSESOPHAGEAL ECHOCARDIOGRAM (TEE);  Surgeon: Rexene Alberts, MD;  Location: [place];  Service: Open Heart Surgery;  Laterality: N/A;   TEE WITHOUT CARDIOVERSION N/A 03/03/2022   Procedure: TRANSESOPHAGEAL ECHOCARDIOGRAM (TEE);  Surgeon: [name], MD;  Location: [facility] ENDOSCOPY;  Service: Cardiovascular;  Laterality: N/A;    Family History  Problem Relation Age of Onset   Hypertension Other    Breast cancer Sister    Breast cancer Other        family history   Breast cancer Brother         prostate ca   Aneurysm Mother        Brain aneurysm    Prostate cancer Father    Colon cancer Neg Hx    Esophageal cancer Neg Hx    Stomach cancer Neg Hx    Rectal cancer Neg Hx     No Known Allergies  Current Outpatient Medications on File Prior to Visit  Medication Sig Dispense Refill  aspirin EC 81 MG tablet Take 81 mg by mouth daily. Swallow whole.     atorvastatin (LIPITOR) 80 MG tablet Take 1 tablet (80 mg total) by mouth daily. 90 tablet 2   Calcium Carbonate (CALCIUM 600 PO) Take 1,200 mg by mouth daily.     cefTRIAXone (ROCEPHIN) IVPB Inject 2 g into the vein daily. Indication:  Possible aortic valve endocarditis - hx of strep  First Dose: Yes Last Day of Therapy: 04/09/22  Labs - Once weekly:  CBC/D and BMP, Labs - Every other week:  ESR and CRP Method of administration: IV Push Method of administration may be changed at the discretion of home infusion pharmacist based upon assessment of the patient and/or caregiver's ability to self-administer the medication ordered. 36 Units 0   cholecalciferol (VITAMIN D3) 25 MCG (1000 UT) tablet Take 1,000 Units by mouth daily.     doxazosin (CARDURA) 8 MG tablet TAKE 1 TABLET AT BEDTIME (Patient taking differently: Take 8 mg by mouth at bedtime.) 90 tablet 3   ferrous sulfate 325 (65 FE) MG EC tablet Take 1 tablet (325 mg total) by mouth 2 (two) times daily before a meal. 60 tablet 2   glipiZIDE (GLUCOTROL XL) 10 MG 24 hr tablet TAKE 1 TABLET TWICE DAILY (Patient taking differently: Take 10 mg by mouth in the morning and at bedtime. TAKE 1 TABLET TWICE DAILY) 180 tablet 0   labetalol (NORMODYNE) 200 MG tablet TAKE 3 TABLETS EVERY MORNING AND TAKE 2 TABLETS EVERY EVENING (Patient taking differently: Take 400-600 mg by mouth See admin instructions. Take 3 tablets (600 mg) every morning and take 2 tablets (400 mg) every evening) 450 tablet 3   levothyroxine (SYNTHROID) 125 MCG tablet Take 1 tablet (125 mcg total) by mouth daily. 90 tablet 2    metFORMIN (GLUCOPHAGE) 1000 MG tablet TAKE 1 TABLET TWICE DAILY WITH MEALS (NEED MD APPOINTMENT FOR REFILLS) (Patient taking differently: Take 1,000 mg by mouth 2 (two) times daily with a meal. (Need MD appointment for refills)) 180 tablet 0   omeprazole (PRILOSEC) 40 MG capsule Take 1 tablet by mouth twice a day X's 2 weeks then reduce back to 1 tablet by mouth daily (Patient taking differently: Take 40 mg by mouth every other day.) 104 capsule 1   potassium chloride SA (KLOR-CON M) 20 MEQ tablet Take 1 tablet (20 mEq total) by mouth daily. 90 tablet 3   predniSONE (DELTASONE) 5 MG tablet TAKE 1 TABLET BY MOUTH ONCE A DAY WITH BREAKFAST (Patient taking differently: Take 5 mg by mouth daily with breakfast.) 90 tablet 3   spironolactone (ALDACTONE) 25 MG tablet Take 1 tablet (25 mg total) by mouth every other day.     torsemide (DEMADEX) 20 MG tablet Take 1 tablet (20 mg total) by mouth daily. 90 tablet 3   No current facility-administered medications on file prior to visit.    BP 128/80   Pulse 62   Temp 99.3 F (37.4 C) (Oral)   Ht '5\' 8"'  (1.727 m)   Wt 197 lb (89.4 kg)   SpO2 99%   BMI 29.95 kg/m       Objective:   Physical Exam Vitals and nursing note reviewed.  Constitutional:      General: He is not in acute distress.    Appearance: Normal appearance. He is well-developed and normal weight.  HENT:     Head: Normocephalic and atraumatic.     Right Ear: Tympanic membrane, ear canal and external ear normal.  There is no impacted cerumen.     Left Ear: Tympanic membrane, ear canal and external ear normal. There is no impacted cerumen.     Nose: Nose normal. No congestion or rhinorrhea.     Mouth/Throat:     Mouth: Mucous membranes are moist.     Pharynx: Oropharynx is clear. No oropharyngeal exudate or posterior oropharyngeal erythema.  Eyes:     General:        Right eye: No discharge.        Left eye: No discharge.     Extraocular Movements: Extraocular movements intact.      Conjunctiva/sclera: Conjunctivae normal.     Pupils: Pupils are equal, round, and reactive to light.  Neck:     Vascular: No carotid bruit.     Trachea: No tracheal deviation.  Cardiovascular:     Rate and Rhythm: Normal rate and regular rhythm.     Pulses: Normal pulses.     Heart sounds: Murmur (harsh murmur) heard.     No friction rub. No gallop.  Pulmonary:     Effort: Pulmonary effort is normal. No respiratory distress.     Breath sounds: Normal breath sounds. No stridor. No wheezing, rhonchi or rales.  Chest:     Chest wall: No tenderness.  Abdominal:     General: Bowel sounds are normal. There is no distension.     Palpations: Abdomen is soft. There is no mass.     Tenderness: There is no abdominal tenderness. There is no right CVA tenderness, left CVA tenderness, guarding or rebound.     Hernia: No hernia is present.  Musculoskeletal:        General: No swelling, tenderness, deformity or signs of injury. Normal range of motion.     Right lower leg: 1+ Pitting Edema present.     Left lower leg: 1+ Pitting Edema present.  Lymphadenopathy:     Cervical: No cervical adenopathy.  Skin:    General: Skin is warm and dry.     Capillary Refill: Capillary refill takes less than 2 seconds.     Coloration: Skin is not jaundiced or pale.     Findings: No bruising, erythema, lesion or rash.  Neurological:     General: No focal deficit present.     Mental Status: He is alert and oriented to person, place, and time.     Cranial Nerves: No cranial nerve deficit.     Sensory: No sensory deficit.     Motor: No weakness.     Coordination: Coordination normal.     Gait: Gait normal.     Deep Tendon Reflexes: Reflexes normal.  Psychiatric:        Mood and Affect: Mood normal.        Behavior: Behavior normal.        Thought Content: Thought content normal.        Judgment: Judgment normal.       Assessment & Plan:  1. Aortic valve endocarditis -Reviewed hospital notes,  discharge instructions, imaging, and labs.  We reviewed his medication list and is currently up-to-date.  He was advised to follow-up with Dr. Burt Knack and infectious disease as directed. - CBC with Differential/Platelet; Future - Basic Metabolic Panel; Future  2 Coronary artery disease involving native coronary artery of native heart without angina pectoris - Continue with statin, ASA, and labetalol  - CBC with Differential/Platelet; Future - Basic Metabolic Panel; Future  3. Chronic heart failure with preserved ejection fraction (HCC) -  Continue with lasix and aldactone  - CBC with Differential/Platelet; Future - Basic Metabolic Panel; Future  4. Mixed hyperlipidemia - Continue with statin  - CBC with Differential/Platelet; Future - Basic Metabolic Panel; Future  5. Subclinical iodine-deficiency hypothyroidism - TSH - Consider dose change of synthroid  - CBC with Differential/Platelet; Future - Basic Metabolic Panel; Future  7. Prostate cancer metastatic to multiple sites [facility]) - Continue with current regimen  - CBC with Differential/Platelet; Future - Basic Metabolic Panel; Future  8. Essential hypertension - Will d/c doxazosin to see if this helps with lower extremity edema - He will be seen by cardiology next week for blood pressure check  - CBC with Differential/Platelet; Future - Basic Metabolic Panel; Future  9. Lower extremity edema  - CBC with Differential/Platelet; Future - Basic Metabolic Panel; Future  [name], NP

## 2022-03-16 NOTE — Progress Notes (Addendum)
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) 06/10/2014   Anemia of chronic disease 05/23/2014   HEMORRHAGE OF RECTUM AND ANUS 12/15/2008   Erectile dysfunction 12/14/2007   SEBORRHEIC KERATOSIS, INFLAMED 08/03/2007   NEOPLASM, SKIN, UNCERTAIN BEHAVIOR 51/88/4166   Hypothyroidism 07/06/2007   Diabetes mellitus ([place]) 07/06/2007   Hyperlipidemia LDL goal <70 07/06/2007   Essential hypertension 07/06/2007    Patient's Medications  New Prescriptions   No medications on file  Previous Medications   ABIRATERONE ACETATE (ZYTIGA) 250 MG TABLET    TAKE 4 TABLETS (1,000 MG TOTAL) BY MOUTH DAILY. TAKE ON AN EMPTY STOMACH 1 HOUR BEFORE OR 2 HOURS AFTER A MEAL   ASPIRIN EC 81 MG TABLET    Take 81 mg by mouth daily. Swallow whole.   ATORVASTATIN (LIPITOR) 80 MG TABLET    Take 1 tablet (80 mg total) by mouth daily.   CALCIUM CARBONATE (CALCIUM 600 PO)    Take 1,200 mg by mouth daily.   CEFTRIAXONE (ROCEPHIN) IVPB    Inject 2 g into the vein daily. Indication:  Possible aortic valve  endocarditis - hx of strep  First Dose: Yes Last Day of Therapy: 04/09/22  Labs - Once weekly:  CBC/D and BMP, Labs - Every other week:  ESR and CRP Method of administration: IV Push Method of administration may be changed at the discretion of home infusion pharmacist based upon assessment of the patient and/or caregiver's ability to self-administer the medication ordered.   CHOLECALCIFEROL (VITAMIN D3) 25 MCG (1000 UT) TABLET    Take 1,000 Units by mouth daily.   DOXAZOSIN (CARDURA) 8 MG TABLET    TAKE 1 TABLET AT BEDTIME   FERROUS SULFATE 325 (65 FE) MG EC TABLET    Take 1 tablet (325 mg total) by mouth 2 (two) times daily before a meal.   GLIPIZIDE (GLUCOTROL XL) 10 MG 24 HR TABLET    TAKE 1 TABLET TWICE DAILY   LABETALOL (NORMODYNE) 200 MG TABLET    TAKE 3 TABLETS EVERY MORNING AND TAKE 2 TABLETS EVERY EVENING   LEVOTHYROXINE (SYNTHROID) 125 MCG TABLET    Take 1 tablet (125 mcg total) by mouth daily.   METFORMIN (GLUCOPHAGE) 1000 MG TABLET    TAKE 1 TABLET TWICE DAILY WITH MEALS (NEED MD APPOINTMENT FOR REFILLS)   OMEPRAZOLE (PRILOSEC) 40 MG CAPSULE    Take 1 tablet by mouth twice a day X's 2 weeks then reduce back to 1 tablet by mouth daily   POTASSIUM CHLORIDE SA (KLOR-CON M) 20 MEQ TABLET    Take 1 tablet (  20 mEq total) by mouth daily.   PREDNISONE (DELTASONE) 5 MG TABLET    TAKE 1 TABLET BY MOUTH ONCE A DAY WITH BREAKFAST   SPIRONOLACTONE (ALDACTONE) 25 MG TABLET    Take 1 tablet (25 mg total) by mouth every other day.   TORSEMIDE (DEMADEX) 20 MG TABLET    Take 1 tablet (20 mg total) by mouth daily.  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  Dental  extractions on 12/31 test 2021 presents for hospital follow-up.  Patient was admitted to 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 Aorto-PA fistula is present with L to R shunting. Thickening at the base of the RCC/LCC leaflets which may represent vegetation. 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 Dr. Henderson(CTS) review of echo on 5/22 had shown cavity of old abscess, mass significant on recent echo.  Given the leukocytosis, fever suspected to be chronic process.  Patient was initially started on Dapto and ceftriaxone.  TEE showed pseudoaneurysm before RCC/LCC impersistent your PA fistula, equal lucent space with concern for aortic root abscess.  Discharged on ceftriaxone to target previously grown strep species, EOT 8/18.  CTS reviewed TEE noted findings as expected.  Repair would involve redo root and reconstruction outflow tract, closure of PA fistula.  Difficult to assess how complex repair will hold up in his case.  Plan to touch base with cardiology(Dr. Burt Knack) for best options.  Review of Systems: Review of Systems  All other systems reviewed and are negative.   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   23 mm human aortic root graft with repair of aorta to pulmonary artery  fistula and reimplantation of left main and right coronary arteries   Severe aortic insufficiency 05/13/2020   acute onset in setting of bacterial endocarditis   Severe aortic stenosis     Social History   Tobacco Use   Smoking status: Former   Smokeless tobacco: Never   Tobacco comments:    quit 2005  Vaping Use   Vaping Use: Never used  Substance Use Topics   Alcohol use: Not Currently    Alcohol/week: 14.0 standard drinks of alcohol    Types: 14 Cans of beer per week   Drug use: No    Family History  Problem Relation Age of Onset   Hypertension Other    Breast cancer Sister    Breast cancer Other  family history   Breast cancer Brother        prostate ca   Aneurysm Mother        Brain aneurysm    Prostate cancer Father    Colon cancer Neg Hx    Esophageal cancer Neg Hx    Stomach cancer Neg Hx    Rectal cancer Neg Hx     No Known Allergies  Health Maintenance  Topic Date Due   OPHTHALMOLOGY EXAM  09/25/2014   COVID-19 Vaccine (4 - Booster for Pfizer series) 09/19/2020   FOOT EXAM  01/22/2022   Zoster Vaccines- Shingrix (2 of 2) 03/03/2022   INFLUENZA VACCINE  03/23/2022   HEMOGLOBIN A1C  08/29/2022   COLONOSCOPY (Pts 45-36yr Insurance coverage will need to be confirmed)  05/04/2023   Pneumonia Vaccine 76 Years old  Completed   Hepatitis C Screening  Completed   HPV VACCINES  Aged Out    Objective:  There were no vitals filed for this visit. There is no height or weight on file to calculate BMI.  Physical Exam Constitutional:      General: He is not in acute distress.    Appearance: He is normal weight. He is not toxic-appearing.  HENT:     Head: Normocephalic and atraumatic.     Right Ear: External ear normal.     Left Ear: External ear normal.     Nose: No congestion or rhinorrhea.     Mouth/Throat:     Mouth: Mucous membranes are moist.     Pharynx: Oropharynx is clear.  Eyes:     Extraocular Movements: Extraocular movements  intact.     Conjunctiva/sclera: Conjunctivae normal.     Pupils: Pupils are equal, round, and reactive to light.  Cardiovascular:     Rate and Rhythm: Normal rate and regular rhythm.     Heart sounds: No murmur heard.    No friction rub. No gallop.     Comments: Sternal keloid scar Pulmonary:     Effort: Pulmonary effort is normal.     Breath sounds: Normal breath sounds.  Abdominal:     General: Abdomen is flat. Bowel sounds are normal.     Palpations: Abdomen is soft.  Musculoskeletal:        General: No swelling. Normal range of motion.     Cervical back: Normal range of motion and neck supple.  Skin:    General: Skin is warm and dry.  Neurological:     General: No focal deficit present.     Mental Status: He is oriented to person, place, and time.  Psychiatric:        Mood and Affect: Mood normal.     Lab Results Lab Results  Component Value Date   WBC 4.8 03/01/2022   HGB 11.0 (L) 03/01/2022   HCT 34.3 (L) 03/01/2022   MCV 88.2 03/01/2022   PLT 126 (L) 03/01/2022    Lab Results  Component Value Date   CREATININE 1.51 (H) 03/03/2022   BUN 22 03/03/2022   NA 143 03/03/2022   K 3.9 03/03/2022   CL 111 03/03/2022   CO2 26 03/03/2022    Lab Results  Component Value Date   ALT 21 02/26/2022   AST 26 02/26/2022   ALKPHOS 46 02/26/2022   BILITOT 0.8 02/26/2022    Lab Results  Component Value Date   CHOL 133 07/30/2021   HDL 62.00 07/30/2021   LDLCALC 56 07/30/2021   LDLDIRECT 105.0 04/28/2016  TRIG 75.0 07/30/2021   CHOLHDL 2 07/30/2021   No results found for: "LABRPR", "RPRTITER" No results found for: "HIV1RNAQUANT", "HIV1RNAVL", "CD4TABS"   A/P #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 Aorto-PA fistula is present with L -> R shunting. Thickening at the base of the RCC/LCC  leaflets which may represent vegetation.  -TEE during admission  showed pseudoaneurysm before RCC/LCC impersistent your PA fistula, equal lucent space with concern for aortic root abscess.  -No fever /leukocytosis. Blood Cx negative -CTS (Dr. Roxan Hockey) reviewed TEE noted findings as expected.  Repair would involve redo root and reconstruction outflow tract, closure of PA fistula.  Difficult to assess how complex repair will hold up in his case.  Discussion ongoing with Cards.  Plan: -On ceftriaxone x 6 weeks (targeting precious grown strep) EOT 8/18 -Labs reviewed: 7/24 wbc 5.2k, Scr 1.1, ESR 2, CRP<1 -Follow-up with ID on 8/15 after Appt with Cards on 8/2  #Keloid scarring at surgical incision-stable  I spent more than 45 minutes for this patient encounter including reviewing data/chart, and coordinating care and >50% direct face to face time providing counseling/discussing diagnostics/treatment plan with patient  Laurice Record, [facility] 03/16/2022, 9:11 AM

## 2022-03-17 ENCOUNTER — Other Ambulatory Visit (INDEPENDENT_AMBULATORY_CARE_PROVIDER_SITE_OTHER): Payer: Medicare PPO

## 2022-03-17 DIAGNOSIS — I2510 Atherosclerotic heart disease of native coronary artery without angina pectoris: Secondary | ICD-10-CM

## 2022-03-17 DIAGNOSIS — R600 Localized edema: Secondary | ICD-10-CM

## 2022-03-17 DIAGNOSIS — I10 Essential (primary) hypertension: Secondary | ICD-10-CM

## 2022-03-17 DIAGNOSIS — I358 Other nonrheumatic aortic valve disorders: Secondary | ICD-10-CM

## 2022-03-17 DIAGNOSIS — E02 Subclinical iodine-deficiency hypothyroidism: Secondary | ICD-10-CM

## 2022-03-17 DIAGNOSIS — E782 Mixed hyperlipidemia: Secondary | ICD-10-CM | POA: Diagnosis not present

## 2022-03-17 DIAGNOSIS — C61 Malignant neoplasm of prostate: Secondary | ICD-10-CM | POA: Diagnosis not present

## 2022-03-17 DIAGNOSIS — I5032 Chronic diastolic (congestive) heart failure: Secondary | ICD-10-CM | POA: Diagnosis not present

## 2022-03-17 LAB — BASIC METABOLIC PANEL
BUN: 24 mg/dL — ABNORMAL HIGH (ref 6–23)
CO2: 29 mEq/L (ref 19–32)
Calcium: 9.2 mg/dL (ref 8.4–10.5)
Chloride: 106 mEq/L (ref 96–112)
Creatinine, Ser: 1.22 mg/dL (ref 0.40–1.50)
GFR: 58.37 mL/min — ABNORMAL LOW (ref >60.00)
Glucose, Bld: 70 mg/dL (ref 70–99)
Potassium: 4 mEq/L (ref 3.5–5.1)
Sodium: 143 mEq/L (ref 135–145)

## 2022-03-17 LAB — CBC WITH DIFFERENTIAL/PLATELET
Basophils Absolute: 0.1 10*3/uL (ref 0.0–0.1)
Basophils Relative: 0.8 % (ref 0.0–3.0)
Eosinophils Absolute: 0.2 10*3/uL (ref 0.0–0.7)
Eosinophils Relative: 3.2 % (ref 0.0–5.0)
HCT: 34.1 % — ABNORMAL LOW (ref 39.0–52.0)
Hemoglobin: 11.2 g/dL — ABNORMAL LOW (ref 13.0–17.0)
Lymphocytes Relative: 34.7 % (ref 12.0–46.0)
Lymphs Abs: 2.2 10*3/uL (ref 0.7–4.0)
MCHC: 32.7 g/dL (ref 30.0–36.0)
MCV: 87.2 fl (ref 78.0–100.0)
Monocytes Absolute: 0.5 10*3/uL (ref 0.1–1.0)
Monocytes Relative: 8.2 % (ref 3.0–12.0)
Neutro Abs: 3.4 10*3/uL (ref 1.4–7.7)
Neutrophils Relative %: 53.1 % (ref 43.0–77.0)
Platelets: 195 10*3/uL (ref 150.0–400.0)
RBC: 3.91 Mil/uL — ABNORMAL LOW (ref 4.22–5.81)
RDW: 15.8 % — ABNORMAL HIGH (ref 11.5–15.5)
WBC: 6.5 10*3/uL (ref 4.0–10.5)

## 2022-03-17 LAB — TSH: TSH: 2.17 u[IU]/mL (ref 0.35–5.50)

## 2022-03-18 ENCOUNTER — Other Ambulatory Visit (HOSPITAL_COMMUNITY): Payer: Self-pay

## 2022-03-21 ENCOUNTER — Other Ambulatory Visit: Payer: Self-pay | Admitting: Nurse Practitioner

## 2022-03-22 DIAGNOSIS — I10 Essential (primary) hypertension: Secondary | ICD-10-CM

## 2022-03-22 DIAGNOSIS — I358 Other nonrheumatic aortic valve disorders: Secondary | ICD-10-CM | POA: Diagnosis not present

## 2022-03-22 DIAGNOSIS — E1169 Type 2 diabetes mellitus with other specified complication: Secondary | ICD-10-CM

## 2022-03-24 ENCOUNTER — Encounter: Payer: Self-pay | Admitting: Cardiovascular Disease

## 2022-03-24 ENCOUNTER — Ambulatory Visit: Payer: Medicare PPO | Admitting: Cardiovascular Disease

## 2022-03-24 VITALS — BP 128/76 | HR 64 | Ht 68.0 in | Wt 193.6 lb

## 2022-03-24 DIAGNOSIS — I7121 Aneurysm of the ascending aorta, without rupture: Secondary | ICD-10-CM

## 2022-03-24 DIAGNOSIS — Z952 Presence of prosthetic heart valve: Secondary | ICD-10-CM

## 2022-03-24 MED ORDER — METOPROLOL TARTRATE 50 MG PO TABS: ORAL_TABLET | ORAL | 0 refills | Status: DC

## 2022-03-24 NOTE — Patient Instructions (Addendum)
Medication Instructions:  Your physician recommends that you continue on your current medications as directed. Please refer to the Current Medication list given to you today.  *If you need a refill on your cardiac medications before your next appointment, please call your pharmacy*   Lab Work: NONE If you have labs (blood work) drawn today and your tests are completely normal, you will receive your results only by: [place] (if you have MyChart) OR A paper copy in the mail If you have any lab test that is abnormal or we need to change your treatment, we will call you to review the results.   Testing/Procedures: ECHO (in 6 months) Your physician has requested that you have an echocardiogram. Echocardiography is a painless test that uses sound waves to create images of your heart. It provides your doctor with information about the size and shape of your heart and how well your heart's chambers and valves are working. This procedure takes approximately one hour. There are no restrictions for this procedure.  Cardiac CTA Your physician has requested that you have cardiac CT. Cardiac computed tomography (CT) is a painless test that uses an x-ray machine to take clear, detailed pictures of your heart. For further information please visit [email or web address]. Please follow instruction sheet as given.  Follow-Up: At [facility], you and your health needs are our priority.  As part of our continuing mission to provide you with exceptional heart care, we have created designated Provider Care Teams.  These Care Teams include your primary Cardiologist (physician) and Advanced Practice Providers (APPs -  Physician Assistants and Nurse Practitioners) who all work together to provide you with the care you need, when you need it.   Your next appointment:   6 month(s)  The format for your next appointment:   In Person  Provider:   [name], MD     Other Instructions   Your  cardiac CT will be scheduled at:   [facility] [address] [place], [place] [address] [phone]  Please arrive at the [facility] and Children's Entrance (Entrance C2) of [facility] 30 minutes prior to test start time. You can use the FREE valet parking offered at entrance C (encouraged to control the heart rate for the test)  Proceed to the [facility] Radiology Department (first floor) to check-in and test prep.  All radiology patients and guests should use entrance C2 at [facility], accessed from [facility], even though the hospital's physical address listed is [address]    Please follow these instructions carefully (unless otherwise directed):  Hold all erectile dysfunction medications at least 3 days (72 hrs) prior to test.  On the Night Before the Test: Be sure to Drink plenty of water. Do not consume any caffeinated/decaffeinated beverages or chocolate 12 hours prior to your test. Do not take any antihistamines 12 hours prior to your test.  On the Day of the Test: Drink plenty of water until 1 hour prior to the test. Do not eat any food 4 hours prior to the test. You may take your regular medications prior to the test.  Take metoprolol (Lopressor) two hours prior to test. HOLD Torsemide morning of the test.      After the Test: Drink plenty of water. After receiving IV contrast, you may experience a mild flushed feeling. This is normal. On occasion, you may experience a mild rash up to 24 hours after the test. This is not dangerous.  If this occurs, you can take Benadryl 25 mg and increase your fluid intake. If you experience trouble breathing, this can be serious. If it is severe call 911 IMMEDIATELY. If it is mild, please call our office. If you take any of these medications: Glipizide/Metformin, Avandament, Glucavance, please do not take 48 hours after completing test unless otherwise instructed.  We will call  to schedule your test 2-4 weeks out understanding that some insurance companies will need an authorization prior to the service being performed.   For non-scheduling related questions, please contact the cardiac imaging nurse navigator should you have any questions/concerns: Marchia Bond, Cardiac Imaging Nurse Navigator Gordy Clement, Cardiac Imaging Nurse Navigator [facility] Direct Office Dial: [phone]   For scheduling needs, including cancellations and rescheduling, please call [place], [phone].   Important Information About Sugar

## 2022-03-24 NOTE — Progress Notes (Signed)
Cardiology Office Note:    Date:  03/26/2022   ID:  Matthew Serrano., DOB 09-12-1945, MRN [id number]  PCP:  [name], NP   [place] Providers Cardiologist:  [name], MD Cardiology APP:  [facility] [place]     Referring MD: [name], NP   Chief Complaint  Patient presents with   Follow-up    Aortic valve disease    History of Present Illness:    Matthew Serrano. is a 76 y.o. 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  He was hospitalized July 7 through March 04, 2022 after noninvasive imaging studies suggested endocarditis with aortic root abscess.  The patient was seen by cardiac surgery and infectious disease specialist during his hospital stay.  Interestingly, he really did not have any symptoms of acute infection and he had negative blood cultures and a normal sed rate.  A PICC line was placed and he is currently being treated with ceftriaxone on an outpatient basis.  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  The patient reports that he is doing well  and denies any recent problems with chest pain, shortness of breath, or heart palpitations.  He has had no fevers or chills.  He has no other complaints at this time. 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   23 mm human aortic root graft with 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 and reimplantation of left main and right coronary arteries   Severe aortic insufficiency 05/13/2020   acute onset in setting of bacterial endocarditis   Severe aortic stenosis     Past Surgical History:  Procedure Laterality Date   AORTIC VALVE REPLACEMENT N/A 05/15/2020   Procedure: AORTIC VALVE REPLACEMENT (AVR) SIZE 23 MM,  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;  Surgeon: Rexene Alberts, MD;  Location: [place];  Service: Open Heart Surgery;  Laterality: N/A;   ASCENDING AORTIC ROOT REPLACEMENT N/A 05/15/2020  Procedure: HUMAN ALLOGRAFT AORTIC ROOT REPLACEMENT;  Surgeon: Rexene Alberts, MD;  Location: [place];  Service: Open Heart Surgery;  Laterality: N/A;   BUBBLE STUDY  03/03/2022   Procedure: BUBBLE STUDY;  Surgeon: [name], MD;  Location: [place];  Service: Cardiovascular;;   COLONOSCOPY     COLONOSCOPY N/A 05/03/2020   Procedure: COLONOSCOPY;  Surgeon: Juanita Craver, MD;  Location: [facility];  Service: Endoscopy;  Laterality: N/A;   COLONOSCOPY W/ BIOPSIES AND POLYPECTOMY      ENTEROSCOPY N/A 05/02/2020   Procedure: ENTEROSCOPY;  Surgeon: Rush Landmark Telford Nab., MD;  Location: [facility];  Service: Gastroenterology;  Laterality: N/A;   HEMOSTASIS CLIP PLACEMENT  05/03/2020   Procedure: HEMOSTASIS CLIP PLACEMENT;  Surgeon: Juanita Craver, MD;  Location: [facility];  Service: Endoscopy;;   HOT HEMOSTASIS N/A 05/03/2020   Procedure: HOT HEMOSTASIS (ARGON PLASMA COAGULATION/BICAP);  Surgeon: Juanita Craver, MD;  Location: [facility];  Service: Endoscopy;  Laterality: N/A;   IR IMAGING GUIDED PORT INSERTION  07/05/2018   POLYPECTOMY     PORT-A-CATH REMOVAL Right 05/20/2020   Procedure: REMOVAL PORT-A-CATH;  Surgeon: Rexene Alberts, MD;  [facility];  Service: Thoracic;  Laterality: Right;   RIGHT/LEFT HEART CATH AND CORONARY ANGIOGRAPHY N/A 02/13/2020   Procedure: RIGHT/LEFT HEART CATH AND CORONARY ANGIOGRAPHY;  Surgeon: Belva Crome, MD;  Location: [facility];  Service: Cardiovascular;  Laterality: N/A;   SUBMUCOSAL INJECTION  05/02/2020   Procedure: SUBMUCOSAL INJECTION;  Surgeon: Rush Landmark Telford Nab., MD;  Location: [facility];  Service: Gastroenterology;;   TEE WITHOUT CARDIOVERSION N/A 05/14/2020   Procedure: TRANSESOPHAGEAL ECHOCARDIOGRAM (TEE);  Surgeon: Sanda Klein, MD;  Location: [place];  Service: Cardiovascular;  Laterality: N/A;   TEE WITHOUT CARDIOVERSION N/A 05/15/2020   Procedure: TRANSESOPHAGEAL ECHOCARDIOGRAM (TEE);  Surgeon: Rexene Alberts, MD;  Location: [place];  Service: Open Heart Surgery;  Laterality: N/A;   TEE WITHOUT CARDIOVERSION N/A 03/03/2022   Procedure: TRANSESOPHAGEAL ECHOCARDIOGRAM (TEE);  Surgeon: [name], MD;  Location: [facility] ENDOSCOPY;  Service: Cardiovascular;  Laterality: N/A;    Current Medications: Current Meds  Medication Sig   abiraterone acetate (ZYTIGA) 250 MG tablet TAKE 4 TABLETS (1,000 MG TOTAL) BY MOUTH DAILY. TAKE ON AN EMPTY STOMACH 1 HOUR BEFORE OR 2 HOURS AFTER A MEAL   aspirin EC 81  MG tablet Take 81 mg by mouth daily. Swallow whole.   atorvastatin (LIPITOR) 80 MG tablet Take 1 tablet (80 mg total) by mouth daily.   Calcium Carbonate (CALCIUM 600 PO) Take 1,200 mg by mouth daily.   cefTRIAXone (ROCEPHIN) IVPB Inject 2 g into the vein daily. Indication:  Possible aortic valve endocarditis - hx of strep  First Dose: Yes Last Day of Therapy: 04/09/22  Labs - Once weekly:  CBC/D and BMP, Labs - Every other week:  ESR and CRP Method of administration: IV Push Method of administration may be changed at the discretion of home infusion pharmacist based upon assessment of the patient and/or caregiver's ability to self-administer the medication ordered.   cholecalciferol (VITAMIN D3) 25 MCG (1000 UT) tablet Take 1,000 Units by mouth daily.   ferrous sulfate 325 (65 FE) MG EC tablet Take 1 tablet (325 mg total) by mouth 2 (two) times daily before a meal.   glipiZIDE (GLUCOTROL XL) 10 MG 24 hr tablet TAKE 1 TABLET TWICE DAILY (Patient taking differently: Take 10 mg by mouth in the morning and at bedtime. TAKE 1 TABLET TWICE DAILY)  labetalol (NORMODYNE) 200 MG tablet TAKE 3 TABLETS EVERY MORNING AND TAKE 2 TABLETS EVERY EVENING (Patient taking differently: Take 400-600 mg by mouth See admin instructions. Take 3 tablets (600 mg) every morning and take 2 tablets (400 mg) every evening)   levothyroxine (SYNTHROID) 125 MCG tablet Take 1 tablet (125 mcg total) by mouth daily.   metFORMIN (GLUCOPHAGE) 1000 MG tablet TAKE 1 TABLET TWICE DAILY WITH MEALS (NEED MD APPOINTMENT FOR REFILLS) (Patient taking differently: Take 1,000 mg by mouth 2 (two) times daily with a meal. (Need MD appointment for refills))   metoprolol tartrate (LOPRESSOR) 50 MG tablet Take 1 tablet by mouth two hours prior to scan   omeprazole (PRILOSEC) 40 MG capsule Take 1 tablet by mouth twice a day X's 2 weeks then reduce back to 1 tablet by mouth daily (Patient taking differently: Take 40 mg by mouth every other day.)    potassium chloride SA (KLOR-CON M) 20 MEQ tablet Take 1 tablet (20 mEq total) by mouth daily.   predniSONE (DELTASONE) 5 MG tablet TAKE 1 TABLET BY MOUTH ONCE A DAY WITH BREAKFAST (Patient taking differently: Take 5 mg by mouth daily with breakfast.)   spironolactone (ALDACTONE) 25 MG tablet Take 1 tablet (25 mg total) by mouth every other day.   torsemide (DEMADEX) 20 MG tablet Take 1 tablet (20 mg total) by mouth daily.     Allergies:   Patient has no known allergies.   Social History   Socioeconomic History   Marital status: Widowed    Spouse name: Not on file   Number of children: Not on file   Years of education: Not on file   Highest education level: Not on file  Occupational History   Not on file  Tobacco Use   Smoking status: Former   Smokeless tobacco: Never   Tobacco comments:    quit 2005  Vaping Use   Vaping Use: Never used  Substance and Sexual Activity   Alcohol use: Not Currently    Alcohol/week: 14.0 standard drinks of alcohol    Types: 14 Cans of beer per week   Drug use: No   Sexual activity: Not on file  Other Topics Concern   Not on file  Social History Narrative   He works as a Freight forwarder    Not married    No kids          Social Determinants of Health   Financial Resource Strain: Low Risk  (11/02/2021)   Overall Financial Resource Strain (CARDIA)    Difficulty of Paying Living Expenses: Not hard at all  Food Insecurity: No Food Insecurity (11/02/2021)   Hunger Vital Sign    Worried About Running Out of Food in the Last Year: Never true    [place] in the Last Year: Never true  Transportation Needs: No Transportation Needs (11/02/2021)   PRAPARE - Hydrologist (Medical): No    Lack of Transportation (Non-Medical): No  Physical Activity: Sufficiently Active (11/02/2021)   Exercise Vital Sign    Days of Exercise per Week: 5 days    Minutes of Exercise per Session: 30 min  Stress: No Stress Concern  Present (11/02/2021)   [place]    Feeling of Stress : Not at all  Social Connections: Socially Isolated (11/12/2020)   Social Connection and Isolation Panel [NHANES]    Frequency of Communication with Friends and Family: More than three  times a week    Frequency of Social Gatherings with Friends and Family: More than three times a week    Attends Religious Services: Never    Marine scientist or Organizations: No    Attends Archivist Meetings: Never    Marital Status: Widowed     Family History: The patient's family history includes Aneurysm in his mother; Breast cancer in his brother, sister, and another family member; Hypertension in an other family member; Prostate cancer in his father. There is no history of Colon cancer, Esophageal cancer, Stomach cancer, or Rectal cancer.  ROS:   Please see the history of present illness.    All other systems reviewed and are negative.  EKGs/Labs/Other Studies Reviewed:    The following studies were reviewed today: Echo 02/10/2022: 1. Left ventricular ejection fraction, by estimation, is 60 to 65%. The  left ventricle has normal function. The left ventricle has no regional  wall motion abnormalities. There is moderate left ventricular hypertrophy  of the basal-septal segment. Left  ventricular diastolic parameters are indeterminate.   2. Right ventricular systolic function is normal. The right ventricular  size is normal. There is mildly elevated pulmonary artery systolic  pressure.   3. Left atrial size was severely dilated.   4. Right atrial size was mildly dilated.   5. The mitral valve is normal in structure. Mild mitral valve  regurgitation. No evidence of mitral stenosis.   6. 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 This likely accounts for atypical  location of aortc valve in LVOT. Prior  echoes reviewed, valve appears  unchanged from prior. There is evidence of  turbulent flow and trivial perivalvular leak (location of native right  coronary cusp), adjacent to PA and likely prior site of aorta-PA fistula.  There is also mild central valve aortic regurgitation. The aortic valve  has been repaired/replaced. Aortic  valve regurgitation is mild. There is a 23 mm human allograft valve  present in the aortic position. Procedure Date: 05/15/20. Echo findings  are consistent with perivalvular leak of the aortic prosthesis.   7. Aortic root has repair at site of prior abscess/fistula. See comments  re: aortic valve replacement. Aortic root/ascending aorta has been  repaired/replaced.   8. The inferior vena cava is normal in size with greater than 50%  respiratory variability, suggesting right atrial pressure of 3 mmHg.   Comparison(s): Prior images reviewed side by side. See comments re: aortic  valve; atypical placement given history of abscess/fistula in aortic root.   Conclusion(s)/Recommendation(s): In parasternal short axis, there is  significant color doppler flow in the aortic root/sinus of Valsalva on the  [place] location. This color signal is seen in PA as well. In some views it  does not appear to travel between  aorta and PA, but in other views the strong color flow is concerning for  communication. Would consider TEE to evaluate aortic root further and  exclude aorta to PA residual shunting. Findings communicated to ordering  provider and primary cardiologist.   TEE: 1. The patient is s/p 57m human allograft aortic root repair. There is a  pseudoaneurysm present below the level of the RCC and LCC cusps extending  into the LVOT. There is an echolucent space along the region of the RCC  extending into the LVOT with  rocking motion of the aortic valve prosthesis concerning for aortic root  abscess. There is persistent flow visualized from  the aortic root into the   main PA just above the level of the pulmonic valve consistent with a  persistent aorto-pulmonary fistula.  The aortc valve leaflets have normal mobility with no vegetations  visualized. There is mild central aortic regurgitation. The aortic valve  has been repaired/replaced.   2. Left ventricular ejection fraction, by estimation, is 65 to 70%. The  left ventricle has normal function.   3. Right ventricular systolic function is normal. The right ventricular  size is normal.   4. Left atrial size was moderately dilated. No left atrial/left atrial  appendage thrombus was detected.   5. The mitral valve is grossly normal. Trivial mitral valve  regurgitation. No evidence of mitral stenosis.   6. Aortic root/ascending aorta has been repaired/replaced. There is  Moderate (Grade III) plaque.   7. Evidence of atrial level shunting detected by color flow Doppler.  Agitated saline contrast bubble study was positive with shunting observed  within 3-6 cardiac cycles suggestive of interatrial shunt.   Cardiac CTA: IMPRESSION: 1. Coronary calcium score of 1915. This was 97th percentile for age-, sex, and race-matched controls. Plaque volume 1254 mm3.   2. Normal coronary origin with right dominance.   3. Mild calcified plaque (25-49%) in the left main, LAD, and LCX.   4. Moderate calcified plaque in the mid RCA (50-69%).   5. 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 Thickening at the base of the RCC/LCC leaflets which may represent vegetation. 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   6. Dilated pulmonary artery suggestive of pulmonary hypertension.  EKG:  EKG is ordered today.  The ekg ordered today demonstrates normal sinus rhythm with LVH 64 bpm, nonspecific T wave abnormality  Recent Labs: 02/26/2022: ALT 21 03/17/2022: BUN 24; Creatinine, Ser 1.22; Hemoglobin 11.2;  Platelets 195.0; Potassium 4.0; Sodium 143; TSH 2.17  Recent Lipid Panel    Component Value Date/Time   CHOL 133 07/30/2021 0736   TRIG 75.0 07/30/2021 0736   HDL 62.00 07/30/2021 0736   CHOLHDL 2 07/30/2021 0736   VLDL 15.0 07/30/2021 0736   LDLCALC 56 07/30/2021 0736   LDLCALC 82 07/29/2020 0831   LDLDIRECT 105.0 04/28/2016 0756     Risk Assessment/Calculations:           Physical Exam:    VS:  BP 128/76   Pulse 64   Ht _0  (1.727 m)   Wt 193 lb 9.6 oz (87.8 kg)   SpO2 96%   BMI 29.44 kg/m     Wt Readings from Last 3 Encounters:  03/24/22 193 lb 9.6 oz (87.8 kg)  03/16/22 197 lb (89.4 kg)  03/16/22 199 lb (90.3 kg)     GEN:  Well nourished, well developed in no acute distress HEENT: Normal NECK: No JVD; No carotid bruits LYMPHATICS: No lymphadenopathy CARDIAC: RRR, 4/6 holosystolic murmur along the left lower sternal border RESPIRATORY:  Clear to auscultation without rales, wheezing or rhonchi  ABDOMEN: Soft, non-tender, non-distended MUSCULOSKELETAL:  No edema; No deformity  SKIN: Warm and dry NEUROLOGIC:  Alert and oriented x 3 PSYCHIATRIC:  Normal affect   ASSESSMENT:    1. Aneurysm of ascending aorta without rupture ([place])   2. S/P AVR    PLAN:    In order of problems listed above:  The patient has a very complex situation with breakdown of his prior homograft repair with a pseudoaneurysm and PA fistula.  He is felt to be at extremely high risk for a redo operation and I have personally reviewed the cardiac surgery notes from his recent hospital stay.  He does not behave like a patient with an active aortic root abscess as he is essentially asymptomatic, has normal AV conduction, and has a normal sed rate.  He will complete his course of IV Rocephin as directed by infectious disease.  I discussed referral to a tertiary center with the patient, but I feel like a redo operation would be extremely morbid for him and have a high risk of a catastrophic  outcome in anybody's hands.  The patient is not interested in seeking an outside opinion at this time.  I will follow him again in 6 months with a repeat echocardiogram, repeat gated cardiac CTA, and an office visit to follow.  He will continue his current medications with no changes.  He understands to seek immediate medical attention if he develops fever, chills, chest pain, syncope, or shortness of breath.           Medication Adjustments/Labs and Tests Ordered: Current medicines are reviewed at length with the patient today.  Concerns regarding medicines are outlined above.  Orders Placed This Encounter  Procedures   CT CORONARY MORPH W/CTA COR W/SCORE W/CA W/CM &/OR WO/CM   EKG 12-Lead   ECHOCARDIOGRAM COMPLETE   Meds ordered this encounter  Medications   metoprolol tartrate (LOPRESSOR) 50 MG tablet    Sig: Take 1 tablet by mouth two hours prior to scan    Dispense:  1 tablet    Refill:  0    Patient Instructions  Medication Instructions:  Your physician recommends that you continue on your current medications as directed. Please refer to the Current Medication list given to you today.  *If you need a refill on your cardiac medications before your next appointment, please call your pharmacy*   Lab Work: NONE If you have labs (blood work) drawn today and your tests are completely normal, you will receive your results only by: [place] (if you have MyChart) OR A paper copy in the mail If you have any lab test that is abnormal or we need to change your treatment, we will call you to review the results.   Testing/Procedures: ECHO (in 6 months) Your physician has requested that you have an echocardiogram. Echocardiography is a painless test that uses sound waves to create images of your heart. It provides your doctor with information about the size and shape of your heart and how well your heart's chambers and valves are working. This procedure takes approximately one  hour. There are no restrictions for this procedure.  Cardiac CTA Your physician has requested that you have cardiac CT. Cardiac computed tomography (CT) is a painless test that uses an x-ray machine to take clear, detailed pictures of your heart. For further information please visit [email or web address][employer][email or web address]. Please follow instruction sheet as given.  Follow-Up: At [facility], you and your health needs are our priority.  As part of our continuing mission to provide you with exceptional heart care, we have created designated Provider Care Teams.  These Care Teams include your primary Cardiologist (physician) and Advanced Practice Providers (APPs -  Physician Assistants and Nurse Practitioners) who all work together to provide you with the care you need, when you need it.   Your next appointment:   6 month(s)  The format for your next appointment:   In Person  Provider:   [name],  MD     Other Instructions   Your cardiac CT will be scheduled at:   [facility] [place], [place] [address] [phone]  Please arrive at the [facility] and Children's Entrance (Entrance C2) of [facility] 30 minutes prior to test start time. You can use the FREE valet parking offered at entrance C (encouraged to control the heart rate for the test)  Proceed to the [facility] Radiology Department (first floor) to check-in and test prep.  All radiology patients and guests should use entrance C2 at [facility], accessed from [facility], even though the hospital's physical address listed is [address]    Please follow these instructions carefully (unless otherwise directed):  Hold all erectile dysfunction medications at least 3 days (72 hrs) prior to test.  On the Night Before the Test: Be sure to Drink plenty of water. Do not consume any caffeinated/decaffeinated beverages or chocolate 12 hours prior to your test. Do not take  any antihistamines 12 hours prior to your test.  On the Day of the Test: Drink plenty of water until 1 hour prior to the test. Do not eat any food 4 hours prior to the test. You may take your regular medications prior to the test.  Take metoprolol (Lopressor) two hours prior to test. HOLD Torsemide morning of the test.      After the Test: Drink plenty of water. After receiving IV contrast, you may experience a mild flushed feeling. This is normal. On occasion, you may experience a mild rash up to 24 hours after the test. This is not dangerous. If this occurs, you can take Benadryl 25 mg and increase your fluid intake. If you experience trouble breathing, this can be serious. If it is severe call 911 IMMEDIATELY. If it is mild, please call our office. If you take any of these medications: Glipizide/Metformin, Avandament, Glucavance, please do not take 48 hours after completing test unless otherwise instructed.  We will call to schedule your test 2-4 weeks out understanding that some insurance companies will need an authorization prior to the service being performed.   For non-scheduling related questions, please contact the cardiac imaging nurse navigator should you have any questions/concerns: Marchia Bond, Cardiac Imaging Nurse Navigator Gordy Clement, Cardiac Imaging Nurse Navigator [facility] Direct Office Dial: [phone]   For scheduling needs, including cancellations and rescheduling, please call [place], [phone].   Important Information About Sugar         Signed, [name], MD  03/26/2022 5:31 PM    [place]

## 2022-03-26 ENCOUNTER — Encounter: Payer: Self-pay | Admitting: Cardiovascular Disease

## 2022-03-29 DIAGNOSIS — I358 Other nonrheumatic aortic valve disorders: Secondary | ICD-10-CM | POA: Diagnosis not present

## 2022-04-05 ENCOUNTER — Other Ambulatory Visit: Payer: Self-pay | Admitting: Adult Health

## 2022-04-05 DIAGNOSIS — E119 Type 2 diabetes mellitus without complications: Secondary | ICD-10-CM

## 2022-04-05 DIAGNOSIS — E782 Mixed hyperlipidemia: Secondary | ICD-10-CM

## 2022-04-06 ENCOUNTER — Encounter: Payer: Self-pay | Admitting: Internal Medicine

## 2022-04-06 ENCOUNTER — Other Ambulatory Visit (HOSPITAL_COMMUNITY): Payer: Self-pay

## 2022-04-06 ENCOUNTER — Ambulatory Visit: Payer: Medicare PPO | Admitting: Internal Medicine

## 2022-04-06 ENCOUNTER — Other Ambulatory Visit: Payer: Self-pay | Admitting: Oncology

## 2022-04-06 ENCOUNTER — Other Ambulatory Visit: Payer: Self-pay

## 2022-04-06 VITALS — BP 130/75 | HR 61 | Temp 98.6°F | Resp 16 | Ht 68.0 in | Wt 195.4 lb

## 2022-04-06 DIAGNOSIS — T82898A Other specified complication of vascular prosthetic devices, implants and grafts, initial encounter: Secondary | ICD-10-CM | POA: Diagnosis not present

## 2022-04-06 DIAGNOSIS — I38 Endocarditis, valve unspecified: Secondary | ICD-10-CM

## 2022-04-06 DIAGNOSIS — T826XXD Infection and inflammatory reaction due to cardiac valve prosthesis, subsequent encounter: Secondary | ICD-10-CM

## 2022-04-06 DIAGNOSIS — T826XXA Infection and inflammatory reaction due to cardiac valve prosthesis, initial encounter: Secondary | ICD-10-CM | POA: Diagnosis not present

## 2022-04-06 DIAGNOSIS — C61 Malignant neoplasm of prostate: Secondary | ICD-10-CM

## 2022-04-06 MED ORDER — ABIRATERONE ACETATE 250 MG PO TABS
ORAL_TABLET | Freq: Every day | ORAL | 0 refills | Status: DC
  Filled 2022-04-06: qty 120, 30d supply, fill #0

## 2022-04-06 NOTE — Progress Notes (Unsigned)
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 05/14/2020   History of streptococcal septicemia 05/13/2020   (HFpEF) heart failure with preserved ejection fraction ([place]) 05/13/2020   Malignant neoplasm of prostate ([place]) 06/23/2018   Goals of care, counseling/discussion 06/23/2018   Aortic valve disease s/p Bioprosthetic AVR, Ao Root Replacment, Repair of Ao to PA fistula 04/2020    Left inguinal hernia 09/04/2015   Alcoholism ([place]) 06/10/2014   Anemia of chronic disease 05/23/2014   HEMORRHAGE OF RECTUM AND ANUS 12/15/2008   Erectile dysfunction 12/14/2007   SEBORRHEIC KERATOSIS, INFLAMED 08/03/2007   NEOPLASM, SKIN, UNCERTAIN BEHAVIOR 16/38/4536   Hypothyroidism 07/06/2007   Diabetes mellitus ([place]) 07/06/2007   Hyperlipidemia LDL goal <70 07/06/2007   Essential hypertension 07/06/2007    Patient's Medications  New Prescriptions   No medications on file  Previous Medications   ABIRATERONE ACETATE (ZYTIGA) 250 MG TABLET    TAKE 4 TABLETS (1,000 MG TOTAL) BY MOUTH DAILY. TAKE ON AN EMPTY STOMACH 1 HOUR BEFORE OR 2 HOURS AFTER A MEAL   ASPIRIN EC 81 MG TABLET    Take 81 mg by mouth daily. Swallow whole.   ATORVASTATIN (LIPITOR) 80 MG TABLET    TAKE 1 TABLET EVERY DAY   CALCIUM CARBONATE (CALCIUM 600 PO)    Take 1,200 mg by mouth daily.   CEFTRIAXONE (ROCEPHIN) IVPB    Inject 2 g into the vein daily. Indication:  Possible aortic valve endocarditis - hx of  strep  First Dose: Yes Last Day of Therapy: 04/09/22  Labs - Once weekly:  CBC/D and BMP, Labs - Every other week:  ESR and CRP Method of administration: IV Push Method of administration may be changed at the discretion of home infusion pharmacist based upon assessment of the patient and/or caregiver's ability to self-administer the medication ordered.   CHOLECALCIFEROL (VITAMIN D3) 25 MCG (1000 UT) TABLET    Take 1,000 Units by mouth daily.   FERROUS SULFATE 325 (65 FE) MG EC TABLET    Take 1 tablet (325 mg total) by mouth 2 (two) times daily before a meal.   GLIPIZIDE (GLUCOTROL XL) 10 MG 24 HR TABLET    TAKE 1 TABLET TWICE DAILY   LABETALOL (NORMODYNE) 200 MG TABLET    TAKE 3 TABLETS EVERY MORNING AND TAKE 2 TABLETS EVERY EVENING   LEVOTHYROXINE (SYNTHROID) 125 MCG TABLET    Take 1 tablet (125 mcg total) by mouth daily.   METFORMIN (GLUCOPHAGE) 1000 MG TABLET    TAKE 1 TABLET TWICE DAILY WITH MEALS   METOPROLOL TARTRATE (LOPRESSOR) 50 MG TABLET    Take 1 tablet by mouth two hours prior to scan   OMEPRAZOLE (PRILOSEC) 40 MG CAPSULE    Take 1 tablet by mouth twice a day X's 2 weeks then reduce back to 1 tablet by mouth daily   POTASSIUM CHLORIDE SA (KLOR-CON M) 20 MEQ TABLET    Take 1 tablet (20 mEq total)  by mouth daily.   PREDNISONE (DELTASONE) 5 MG TABLET    TAKE 1 TABLET BY MOUTH ONCE A DAY WITH BREAKFAST   SPIRONOLACTONE (ALDACTONE) 25 MG TABLET    Take 1 tablet (25 mg total) by mouth every other day.   TORSEMIDE (DEMADEX) 20 MG TABLET    Take 1 tablet (20 mg total) by mouth daily.  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  Dental extractions on 12/31  test 2021 presents for hospital follow-up.  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 Aorto-PA fistula is present with L to R shunting. Thickening at the base of the RCC/LCC leaflets which may represent vegetation. 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 Dr. Henderson(CTS) review of echo on 5/22 had shown cavity of old abscess, mass significant on recent echo.  Given the leukocytosis, fever suspected to be chronic process.  Patient was initially started on Dapto and ceftriaxone.  TEE showed pseudoaneurysm before RCC/LCC impersistent your PA fistula, equal lucent space with concern for aortic root abscess.  Discharged on ceftriaxone to target previously grown strep species, EOT 8/18.  CTS reviewed TEE noted findings as expected.  Repair would involve redo root and reconstruction outflow tract, closure of PA fistula.  Difficult to assess how complex repair will hold up in his case.  Plan to touch base with cardiology(Dr. Burt Knack) for best options.  Today 8/15: Pt reports he feels well. Denies fever and chills.  Review of Systems: Review of Systems  All other systems reviewed and are negative.   Past Medical History:  Diagnosis Date   Aortic valve endocarditis 05/13/2020   STREPTOCOCCUS ANGINOSIS   AVM (arteriovenous malformation) of colon    Cancer ([facility])    prostate cancer   DIABETES MELLITUS, TYPE II 07/06/2007   ED (erectile dysfunction)    Elevated PSA 01/20/2012   Fistula between aorta and pulmonary artery ([place]) 05/14/2020   History of nuclear stress test    Myoview 8/18: EF 64, inferobasal thinning, no ischemia, low risk   HYPERLIPIDEMIA 07/06/2007   HYPERTENSION 07/06/2007   HYPOTHYROIDISM 07/06/2007   Leg pain    ABIs 8/18:  Normal    Obesity    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   23 mm human aortic root graft  with 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 and reimplantation of left main and right coronary arteries   Severe aortic insufficiency 05/13/2020   acute onset in setting of bacterial endocarditis   Severe aortic stenosis     Social History   Tobacco Use   Smoking status: Former   Smokeless tobacco: Never   Tobacco comments:    quit 2005  Vaping Use   Vaping Use: Never used  Substance Use Topics   Alcohol use: Not Currently    Alcohol/week: 14.0 standard drinks of alcohol    Types: 14 Cans of beer per week   Drug use: No    Family History  Problem Relation Age of Onset   Hypertension Other    Breast cancer  Sister    Breast cancer Other        family history   Breast cancer Brother        prostate ca   Aneurysm Mother        Brain aneurysm    Prostate cancer Father    Colon cancer Neg Hx    Esophageal cancer Neg Hx    Stomach cancer Neg Hx    Rectal cancer Neg Hx     No Known Allergies  Health Maintenance  Topic Date Due   OPHTHALMOLOGY EXAM  09/25/2014   URINE MICROALBUMIN  04/28/2017   COVID-19 Vaccine (4 - Pfizer risk series) 09/19/2020   FOOT EXAM  01/22/2022   Zoster Vaccines- Shingrix (2 of 2) 03/03/2022   INFLUENZA VACCINE  03/23/2022   HEMOGLOBIN A1C  08/29/2022   COLONOSCOPY (Pts 45-65yr Insurance coverage will need to be confirmed)  05/04/2023   Pneumonia Vaccine 75 Years old  Completed   Hepatitis C Screening  Completed   HPV VACCINES  Aged Out    Objective:  Vitals:   04/06/22 0940  BP: 130/75  Pulse: 61  Resp: 16  Temp: 98.6 F (37 C)  TempSrc: Oral  SpO2: 97%  Weight: 195 lb 6.4 oz (88.6 kg)  Height: '5\' 8"'  (1.727 m)   Body mass index is 29.71 kg/m.  Physical Exam Constitutional:      General: He is not in acute distress.    Appearance: He is normal weight. He is not toxic-appearing.  HENT:     Head: Normocephalic and atraumatic.     Right Ear: External ear normal.     Left Ear: External ear normal.     Nose: No  congestion or rhinorrhea.     Mouth/Throat:     Mouth: Mucous membranes are moist.     Pharynx: Oropharynx is clear.  Eyes:     Extraocular Movements: Extraocular movements intact.     Conjunctiva/sclera: Conjunctivae normal.     Pupils: Pupils are equal, round, and reactive to light.  Cardiovascular:     Rate and Rhythm: Normal rate and regular rhythm.     Heart sounds: No murmur heard.    No friction rub. No gallop.  Pulmonary:     Effort: Pulmonary effort is normal.     Breath sounds: Normal breath sounds.  Abdominal:     General: Abdomen is flat. Bowel sounds are normal.     Palpations: Abdomen is soft.  Musculoskeletal:        General: No swelling. Normal range of motion.     Cervical back: Normal range of motion and neck supple.  Skin:    General: Skin is warm and dry.  Neurological:     General: No focal deficit present.     Mental Status: He is oriented to person, place, and time.  Psychiatric:        Mood and Affect: Mood normal.     Lab Results Lab Results  Component Value Date   WBC 6.5 03/17/2022   HGB 11.2 (L) 03/17/2022   HCT 34.1 (L) 03/17/2022   MCV 87.2 03/17/2022   PLT 195.0 03/17/2022    Lab Results  Component Value Date   CREATININE 1.22 03/17/2022   BUN 24 (H) 03/17/2022   NA 143 03/17/2022   K 4.0 03/17/2022   CL 106 03/17/2022   CO2 29 03/17/2022    Lab Results  Component Value Date   ALT 21 02/26/2022   AST 26 02/26/2022  ALKPHOS 46 02/26/2022   BILITOT 0.8 02/26/2022    Lab Results  Component Value Date   CHOL 133 07/30/2021   HDL 62.00 07/30/2021   LDLCALC 56 07/30/2021   LDLDIRECT 105.0 04/28/2016   TRIG 75.0 07/30/2021   CHOLHDL 2 07/30/2021   No results found for: "LABRPR", "RPRTITER" No results found for: "HIV1RNAQUANT", "HIV1RNAVL", "CD4TABS"     A/P #Presumed prosthetic aortic valve endocarditis #Hx of AV endocarditis with strep anginosus SP AVR on 05/15/20 -Admitted as CT coronary done by cardiology showed   extensive aortic root abscess with pseudoaneurysm of the LVOT. Aorto-PA fistula is present with L -> R shunting. Thickening at the base of the RCC/LCC leaflets which may represent vegetation.  -TEE during admission  showed pseudoaneurysm before RCC/LCC impersistent your PA fistula, equal lucent space with concern for aortic root abscess.  -No fever /leukocytosis. Blood Cx negative -CTS (Dr. Roxan Hockey) reviewed TEE noted findings as expected.  Repair would involve redo root and reconstruction outflow tract, closure of PA fistula.  Difficult to assess how complex repair will hold up in his case.  Discussion ongoing with Cards.  -Patient was seen by Dr. Burt Knack, cardiology on 8/2 referral to tertiary care center was discussed, patient not interested due to likely unfavorable outcome of her feet of upper EXTR.  Plan to repeat echo, CTA in 6 months. Plan: -On ceftriaxone x 6 weeks (targeting precious grown strep) EOT 8/18. Pull PICC after completion of antibiotics.  -Labs reviewed on 8/7: wbc 6.1k, Scr 1.0, ESR 2, CRP<1 -Follow-up with ID in 1 month to do labs off of antibioics      I spent more than 45 minutes for this patient encounter including reviewing data/chart, and coordinating care and >50% direct face to face time providing counseling/discussing diagnostics/treatment plan with patient   Laurice Record, [place] [facility] 04/06/2022, 9:47 AM

## 2022-04-07 ENCOUNTER — Telehealth: Payer: Self-pay | Admitting: Pharmacist

## 2022-04-07 NOTE — Chronic Care Management (AMB) (Signed)
    Chronic Care Management Pharmacy Assistant   Name: Matthew Serrano.  MRN: [id number] DOB: 04/15/1946  Reason for Encounter: Follow up blood pressure and medication change.  Patients medication Doxazosin was discontinued on 03/16/2022 due to swelling, blood pressure was well controlled.  Follow up with swelling and blood pressure readings.   Spoke with patient, he states his swelling has completely cleared since discontinuing doxazosin and his blood pressures have been good.  His blood pressure this morning was 127/76, he states he doesn't have his blood pressures written down however, this is the range his blood pressures have been running.   Care Gaps: AWV - scheduled 11/08/2022 Last BP - 120/62 on 02/19/2022 Last A1C - 6.1 on 02/26/2022 Eye exam - overdue Malb - overdue Covid booster - overdue Foot exam - overdue Shingrix - overdue Flu - due  Star Rating Drugs: Atorvastatin 80 mg - last filled 01/19/2022 90 DS at [facility] Glipizide 10 mg - last filled 01/19/2022 90 DS at [facility] Metformin 1000 mg - last filled 01/19/2022 90 DS at [place] [phone]

## 2022-04-12 ENCOUNTER — Ambulatory Visit (HOSPITAL_COMMUNITY): Admission: RE | Admit: 2022-04-12 | Payer: Medicare PPO | Source: Ambulatory Visit

## 2022-04-19 IMAGING — DX DG CHEST 2V
2 series · 2 of 2 positions shown · non-contrast
Comparison: 05/19/2020 chest radiograph.

CLINICAL DATA: Aortic valve endocarditis status post AVR 05/15/2020

EXAM:
CHEST - 2 VIEW

[dg chest 2 view (1 of 2)]
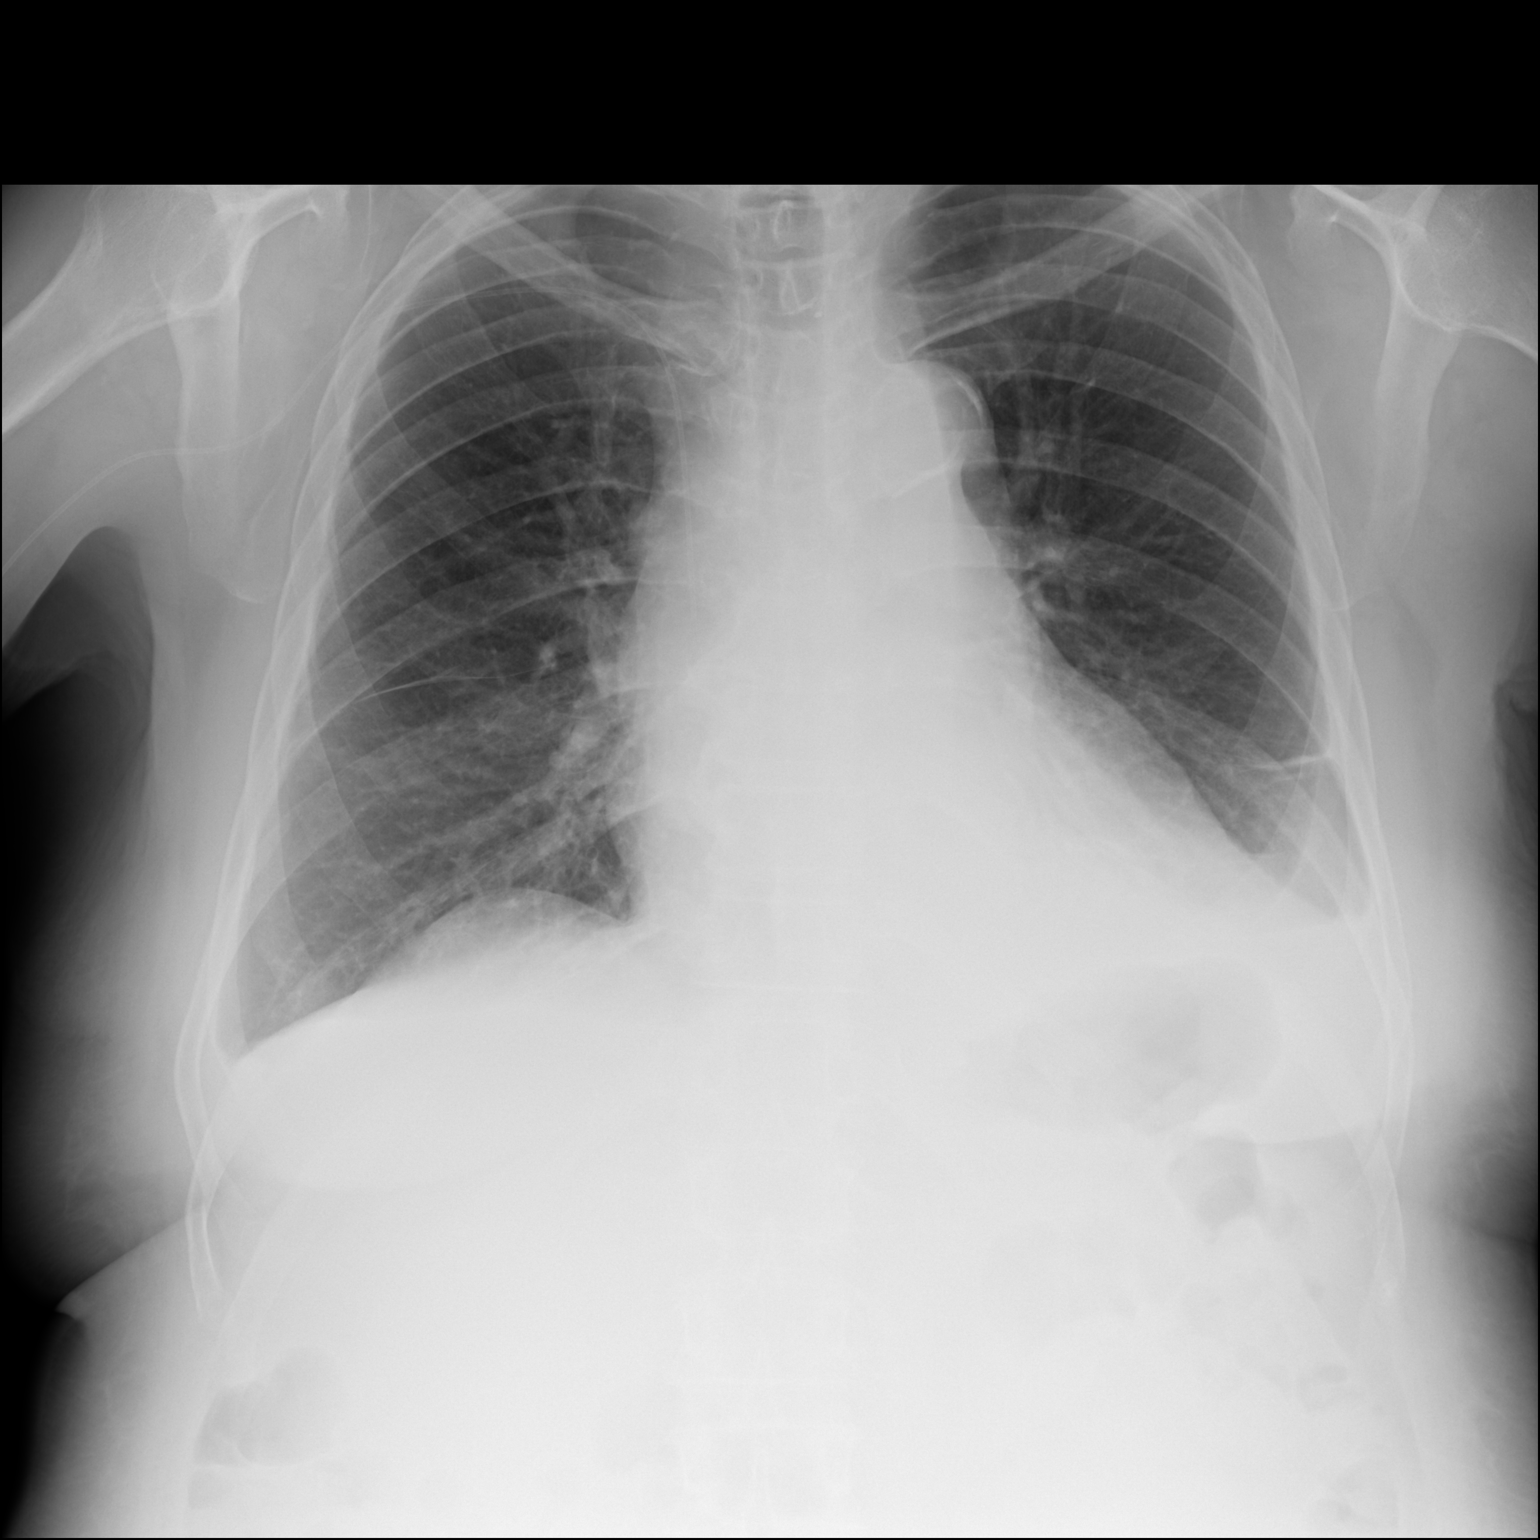

[dg chest 2 view (2 of 2)]
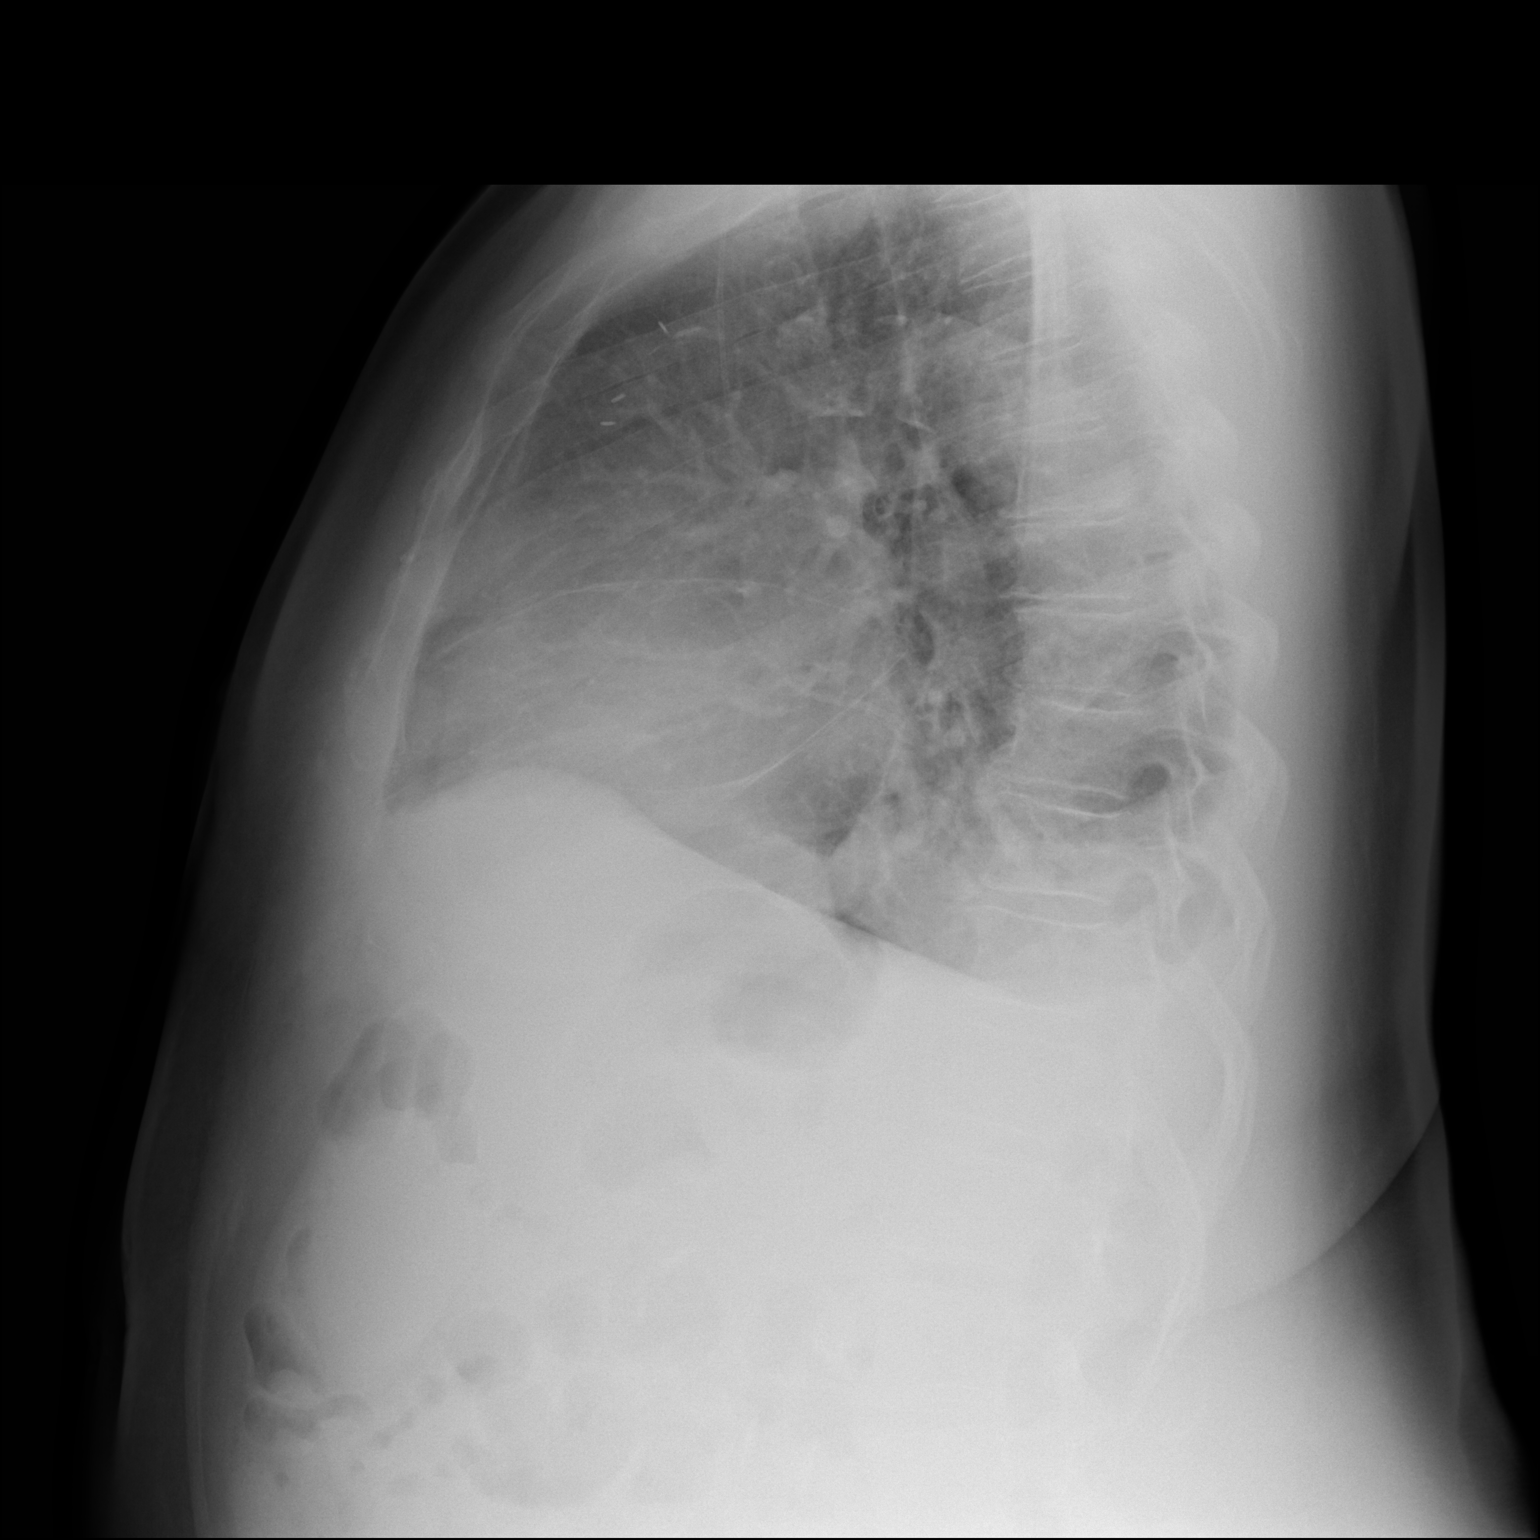

[2 of 2 positions shown; findings below may reference images not displayed]

FINDINGS: Right PICC terminates in the lower third of the SVC. Stable
cardiomediastinal silhouette with mild cardiomegaly. No
pneumothorax. Small bilateral pleural effusions, left greater than
right slightly decreased. No pulmonary edema. Improved aeration at
the lung bases with persistent mild-to-moderate left lung base
atelectasis.
IMPRESSION: 1. Small bilateral pleural effusions, left greater than right,
slightly decreased.
2. Improved aeration at the lung bases with persistent
mild-to-moderate left lung base atelectasis.
3. Stable mild cardiomegaly without pulmonary edema.

## 2022-04-20 ENCOUNTER — Other Ambulatory Visit (HOSPITAL_COMMUNITY): Payer: Self-pay

## 2022-04-23 ENCOUNTER — Inpatient Hospital Stay: Payer: Medicare PPO | Admitting: Oncology

## 2022-04-23 ENCOUNTER — Other Ambulatory Visit: Payer: Self-pay

## 2022-04-23 ENCOUNTER — Inpatient Hospital Stay: Payer: Medicare PPO

## 2022-04-23 ENCOUNTER — Inpatient Hospital Stay: Payer: Medicare PPO | Attending: Oncology

## 2022-04-23 VITALS — BP 102/61 | HR 65 | Temp 97.8°F | Resp 18 | Ht 68.0 in | Wt 195.0 lb

## 2022-04-23 DIAGNOSIS — C61 Malignant neoplasm of prostate: Secondary | ICD-10-CM

## 2022-04-23 DIAGNOSIS — E876 Hypokalemia: Secondary | ICD-10-CM | POA: Insufficient documentation

## 2022-04-23 DIAGNOSIS — Z79899 Other long term (current) drug therapy: Secondary | ICD-10-CM | POA: Insufficient documentation

## 2022-04-23 LAB — CBC WITH DIFFERENTIAL (CANCER CENTER ONLY)
Abs Immature Granulocytes: 0.02 10*3/uL (ref 0.00–0.07)
Basophils Absolute: 0.1 10*3/uL (ref 0.0–0.1)
Basophils Relative: 1 %
Eosinophils Absolute: 0.1 10*3/uL (ref 0.0–0.5)
Eosinophils Relative: 1 %
HCT: 34.9 % — ABNORMAL LOW (ref 39.0–52.0)
Hemoglobin: 11.6 g/dL — ABNORMAL LOW (ref 13.0–17.0)
Immature Granulocytes: 0 %
Lymphocytes Relative: 21 %
Lymphs Abs: 1.5 10*3/uL (ref 0.7–4.0)
MCH: 28.9 pg (ref 26.0–34.0)
MCHC: 33.2 g/dL (ref 30.0–36.0)
MCV: 86.8 fL (ref 80.0–100.0)
Monocytes Absolute: 0.5 10*3/uL (ref 0.1–1.0)
Monocytes Relative: 7 %
Neutro Abs: 4.9 10*3/uL (ref 1.7–7.7)
Neutrophils Relative %: 70 %
Platelet Count: 216 10*3/uL (ref 150–400)
RBC: 4.02 MIL/uL — ABNORMAL LOW (ref 4.22–5.81)
RDW: 14.4 % (ref 11.5–15.5)
WBC Count: 7.1 10*3/uL (ref 4.0–10.5)
nRBC: 0 % (ref 0.0–0.2)

## 2022-04-23 LAB — CMP (CANCER CENTER ONLY)
ALT: 16 U/L (ref 0–44)
AST: 17 U/L (ref 15–41)
Albumin: 4.2 g/dL (ref 3.5–5.0)
Alkaline Phosphatase: 53 U/L (ref 38–126)
Anion gap: 8 (ref 5–15)
BUN: 25 mg/dL — ABNORMAL HIGH (ref 8–23)
CO2: 29 mmol/L (ref 22–32)
Calcium: 9.5 mg/dL (ref 8.9–10.3)
Chloride: 105 mmol/L (ref 98–111)
Creatinine: 1.12 mg/dL (ref 0.61–1.24)
GFR, Estimated: >60 mL/min (ref >60)
Glucose, Bld: 136 mg/dL — ABNORMAL HIGH (ref 70–99)
Potassium: 4.1 mmol/L (ref 3.5–5.1)
Sodium: 142 mmol/L (ref 135–145)
Total Bilirubin: 1 mg/dL (ref 0.3–1.2)
Total Protein: 6.4 g/dL — ABNORMAL LOW (ref 6.5–8.1)

## 2022-04-23 MED ORDER — LEUPROLIDE ACETATE (4 MONTH) 30 MG ~~LOC~~ KIT
30.0000 mg | PACK | Freq: Once | SUBCUTANEOUS | Status: AC
  Administered 2022-04-23: 30 mg via SUBCUTANEOUS
  Filled 2022-04-23: qty 30

## 2022-04-23 MED ORDER — DENOSUMAB 60 MG/ML ~~LOC~~ SOSY
60.0000 mg | PREFILLED_SYRINGE | Freq: Once | SUBCUTANEOUS | Status: AC
  Administered 2022-04-23: 60 mg via SUBCUTANEOUS
  Filled 2022-04-23: qty 1

## 2022-04-23 NOTE — Progress Notes (Signed)
Hematology and Oncology Follow Up Visit  Matthew Serrano [id number] 07/13/1946 76 y.o. 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  He presented with PSA of 1170 and a Gleason score 4+5 = 9.   Prior Therapy:  Status post prostate biopsy obtained on 06/06/2018 which showed high-volume disease with a Gleason score 4+5 = 9.  Taxotere chemotherapy at 75 mg/m every 3 weeks started on 07/07/2018.  He he is here for cycle 6 of therapy.  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  Current therapy:   Eligard 30 mg every 4 months.  Next injection will be given today.  Zytiga 1000 mg daily started in April 2020.  Next injection will be on April 23, 2022.  Interim History: Matthew Serrano is here for a follow-up evaluation.  Since last visit, he reports no major changes in his health.  He was hospitalized in July 2023 for endocarditis with symptoms have resolved at this time.  He denies any fevers chills or sweats.  He denies any chest pain or shortness of breath.  He denies any bone pain or pathological fractures.  He denies any complications related to Zytiga.                   Medications: Reviewed without changes. Current Outpatient Medications  Medication Sig Dispense Refill   abiraterone acetate (ZYTIGA) 250 MG tablet TAKE 4 TABLETS (1,000 MG TOTAL) BY MOUTH DAILY. TAKE ON AN EMPTY STOMACH 1 HOUR BEFORE OR 2 HOURS AFTER A MEAL 120 tablet 0   aspirin EC 81 MG tablet Take 81 mg by mouth daily. Swallow whole.     atorvastatin (LIPITOR) 80 MG tablet TAKE 1 TABLET EVERY DAY 90 tablet 0   Calcium Carbonate (CALCIUM 600 PO) Take 1,200 mg by mouth daily.     cholecalciferol (VITAMIN D3) 25 MCG (1000 UT) tablet Take 1,000 Units by mouth daily.     ferrous sulfate 325 (65 FE) MG EC tablet Take  1 tablet (325 mg total) by mouth 2 (two) times daily before a meal. 60 tablet 2   glipiZIDE (GLUCOTROL XL) 10 MG 24 hr tablet TAKE 1 TABLET TWICE DAILY (Patient taking differently: Take 10 mg by mouth in the morning and at bedtime. TAKE 1 TABLET TWICE DAILY) 180 tablet 0   labetalol (NORMODYNE) 200 MG tablet TAKE 3 TABLETS EVERY MORNING AND TAKE 2 TABLETS EVERY EVENING (Patient taking differently: Take 400-600 mg by mouth See admin instructions. Take 3 tablets (600 mg) every morning and take 2 tablets (400 mg) every evening) 450 tablet 3   levothyroxine (SYNTHROID) 125 MCG tablet Take 1 tablet (125 mcg total) by mouth daily. 90 tablet 2   metFORMIN (GLUCOPHAGE) 1000 MG tablet TAKE 1 TABLET TWICE DAILY WITH MEALS 180 tablet 0   metoprolol tartrate (LOPRESSOR) 50 MG tablet Take 1 tablet by mouth two hours prior to scan 1 tablet 0   omeprazole (PRILOSEC) 40 MG capsule Take 1 tablet by mouth twice a day X's 2 weeks then reduce back to 1 tablet by mouth daily (Patient taking differently: Take 40 mg by mouth every other day.) 104 capsule 1   potassium chloride SA (KLOR-CON M) 20 MEQ tablet Take 1 tablet (20 mEq total) by mouth daily. 90 tablet 3   predniSONE (DELTASONE) 5 MG tablet TAKE 1 TABLET BY MOUTH ONCE A  DAY WITH BREAKFAST (Patient taking differently: Take 5 mg by mouth daily with breakfast.) 90 tablet 3   spironolactone (ALDACTONE) 25 MG tablet Take 1 tablet (25 mg total) by mouth every other day.     torsemide (DEMADEX) 20 MG tablet Take 1 tablet (20 mg total) by mouth daily. 90 tablet 3   No current facility-administered medications for this visit.     Allergies: No Known Allergies      Physical Exam:       Blood pressure 102/61, pulse 65, temperature 97.8 F (36.6 C), temperature source Temporal, resp. rate 18, height '5\' 8"'$  (1.727 m), weight 195 lb (88.5 kg), SpO2 99 %.       ECOG: 0     General appearance: Comfortable appearing without any discomfort Head:  Normocephalic without any trauma Oropharynx: Mucous membranes are moist and pink without any thrush or ulcers. Eyes: Pupils are equal and round reactive to light. Lymph nodes: No cervical, supraclavicular, inguinal or axillary lymphadenopathy.   Heart:regular rate and rhythm.  S1 and S2 without leg edema. Lung: Clear without any rhonchi or wheezes.  No dullness to percussion. Abdomin: Soft, nontender, nondistended with good bowel sounds.  No hepatosplenomegaly. Musculoskeletal: No joint deformity or effusion.  Full range of motion noted. Neurological: No deficits noted on motor, sensory and deep tendon reflex exam. Skin: No petechial rash or dryness.  Appeared moist.                Lab Results: Lab Results  Component Value Date   WBC 6.5 03/17/2022   HGB 11.2 (L) 03/17/2022   HCT 34.1 (L) 03/17/2022   MCV 87.2 03/17/2022   PLT 195.0 03/17/2022   PSA 0.00 Repeated and verified X2. (L) 07/30/2021     Chemistry      Component Value Date/Time   NA 143 03/17/2022 0734   NA 144 02/19/2022 0819   K 4.0 03/17/2022 0734   CL 106 03/17/2022 0734   CO2 29 03/17/2022 0734   BUN 24 (H) 03/17/2022 0734   BUN 20 02/19/2022 0819   CREATININE 1.22 03/17/2022 0734   CREATININE 1.31 (H) 12/25/2021 0822   CREATININE 1.13 07/29/2020 0831      Component Value Date/Time   CALCIUM 9.2 03/17/2022 0734   ALKPHOS 46 02/26/2022 1816   AST 26 02/26/2022 1816   AST 19 12/25/2021 0822   ALT 21 02/26/2022 1816   ALT 16 12/25/2021 0822   BILITOT 0.8 02/26/2022 1816   BILITOT 0.7 12/25/2021 0822                    Impression and Plan:  76 year old man with:   1.  Advanced prostate cancer with lymphadenopathy diagnosed in 2019.  He has castration-resistant at this time.  His PSA continues to be undetectable currently on Zytiga.  Complication associated with this treatment include nausea, fatigue, adrenal insufficiency, and others.  Alternative treatment options including  Jevtana chemotherapy, Pluvicto or PARP inhibitor if he harbors appropriate mutation.  He is agreeable to continue.  2.  Androgen deprivation: He will receive Eligard today and repeated in 4 months.  Complications including hot flashes, weight gain and sexual dysfunction were reiterated.  He is agreeable to proceed.   3.  Goals of care and prognosis: Therapy remains palliative although aggressive measures are warranted.  4.  Hypokalemia: His potassium has been within normal range without any need for replacement.  5.  Bone health.  Risks and benefits of proceeding with Prolia  were discussed at this time.  Osteonecrosis of the jaw and hypocalcemia were reviewed.  We will proceed with treatment today pending his calcium level.  He continues to be on calcium and vitamin D supplements.    6.  Follow-up: He will return in 4 months for a follow-up.   30  minutes were dedicated to this visit.  The time was spent on reviewing laboratory data, disease status update and outlining future plan of care discussion.        [name], MD 9/1/20239:16 AM      And

## 2022-04-23 NOTE — Patient Instructions (Signed)
Denosumab Injection (Osteoporosis) What is this medication? DENOSUMAB (den oh SUE mab) prevents and treats osteoporosis. It works by Paramedic stronger and less likely to break (fracture). It is a monoclonal antibody. This medicine may be used for other purposes; ask your health care provider or pharmacist if you have questions. COMMON BRAND NAME(S): Prolia What should I tell my care team before I take this medication? They need to know if you have any of these conditions: Dental or gum disease, or plan to have dental surgery or a tooth pulled Infection Kidney disease Low levels of calcium or vitamin D in your blood On dialysis Poor nutrition Skin conditions Thyroid disease, or have had thyroid or parathyroid surgery Trouble absorbing minerals in your stomach or intestine An unusual reaction to denosumab, other medications, foods, dyes, or preservatives Pregnant or trying to get pregnant Breast-feeding How should I use this medication? This medication is injected under the skin. It is given by your care team in a hospital or clinic setting. A special MedGuide will be given to you before each treatment. Be sure to read this information carefully each time. Talk to your care team about the use of this medication in children. Special care may be needed. Overdosage: If you think you have taken too much of this medicine contact a poison control center or emergency room at once. NOTE: This medicine is only for you. Do not share this medicine with others. What if I miss a dose? Keep appointments for follow-up doses. It is important not to miss your dose. Call your care team if you are unable to keep an appointment. What may interact with this medication? Do not take this medication with any of the following: Other medications that contain denosumab This medication may also interact with the following: Medications that lower your chance of fighting infection Steroid medications, such  as prednisone or cortisone This list may not describe all possible interactions. Give your health care provider a list of all the medicines, herbs, non-prescription drugs, or dietary supplements you use. Also tell them if you smoke, drink alcohol, or use illegal drugs. Some items may interact with your medicine. What should I watch for while using this medication? Your condition will be monitored carefully while you are receiving this medication. You may need blood work while taking this medication. This medication may increase your risk of getting an infection. Call your care team for advice if you get a fever, chills, sore throat, or other symptoms of a cold or flu. Do not treat yourself. Try to avoid being around people who are sick. Tell your dentist and dental surgeon that you are taking this medication. You should not have major dental surgery while on this medication. See your dentist to have a dental exam and fix any dental problems before starting this medication. Take good care of your teeth while on this medication. Make sure you see your dentist for regular follow-up appointments. You should make sure you get enough calcium and vitamin D while you are taking this medication. Discuss the foods you eat and the vitamins you take with your care team. Talk to your care team if you are pregnant or think you might be pregnant. This medication can cause serious birth defects if taken during pregnancy and for 5 months after the last dose. You will need a negative pregnancy test before starting this medication. Contraception is recommended while taking this medication and for 5 months after the last dose. Your care team can  help you find the option that works for you. Talk to your care team before breastfeeding. Changes to your treatment plan may be needed. What side effects may I notice from receiving this medication? Side effects that you should report to your care team as soon as possible: Allergic  reactions--skin rash, itching, hives, swelling of the face, lips, tongue, or throat Infection--fever, chills, cough, sore throat, wounds that don't heal, pain or trouble when passing urine, general feeling of discomfort or being unwell Low calcium level--muscle pain or cramps, confusion, tingling, or numbness in the hands or feet Osteonecrosis of the jaw--pain, swelling, or redness in the mouth, numbness of the jaw, poor healing after dental work, unusual discharge from the mouth, visible bones in the mouth Severe bone, joint, or muscle pain Skin infection--skin redness, swelling, warmth, or pain Side effects that usually do not require medical attention (report these to your care team if they continue or are bothersome): Back pain Headache Joint pain Muscle pain Pain in the hands, arms, legs, or feet Runny or stuffy nose Sore throat This list may not describe all possible side effects. Call your doctor for medical advice about side effects. You may report side effects to FDA at 1-800-FDA-1088. Where should I keep my medication? This medication is given in a hospital or clinic. It will not be stored at home. NOTE: This sheet is a summary. It may not cover all possible information. If you have questions about this medicine, talk to your doctor, pharmacist, or health care provider.  2023 Elsevier/Gold Standard (2021-12-21 00:00:00)    Leuprolide Suspension for Injection (Prostate Cancer) What is this medication? LEUPROLIDE (loo PROE lide) reduces the symptoms of prostate cancer. It works by decreasing levels of the hormone testosterone in the body. This prevents prostate cancer cells from spreading or growing. This medicine may be used for other purposes; ask your health care provider or pharmacist if you have questions. COMMON BRAND NAME(S): Eligard, Fensolvi, Lupron Depot, Lupron Depot-Ped, Lutrate Depot, Viadur What should I tell my care team before I take this medication? They need to  know if you have any of these conditions: Diabetes Heart disease Heart failure High or low levels of electrolytes, such as magnesium, potassium, or sodium in your blood Irregular heartbeat or rhythm Seizures An unusual or allergic reaction to leuprolide, other medications, foods, dyes, or preservatives Pregnant or trying to get pregnant Breast-feeding How should I use this medication? This medication is injected under the skin or into a muscle. It is given by your care team in a hospital or clinic setting. Talk to your care team about the use of this medication in children. Special care may be needed. Overdosage: If you think you have taken too much of this medicine contact a poison control center or emergency room at once. NOTE: This medicine is only for you. Do not share this medicine with others. What if I miss a dose? Keep appointments for follow-up doses. It is important not to miss your dose. Call your care team if you are unable to keep an appointment. What may interact with this medication? Do not take this medication with any of the following: Cisapride Dronedarone Ketoconazole Levoketoconazole Pimozide Thioridazine This medication may also interact with the following: Other medications that cause heart rhythm changes This list may not describe all possible interactions. Give your health care provider a list of all the medicines, herbs, non-prescription drugs, or dietary supplements you use. Also tell them if you smoke, drink alcohol, or use  illegal drugs. Some items may interact with your medicine. What should I watch for while using this medication? Visit your care team for regular checks on your progress. Tell your care team if your symptoms do not start to get better or if they get worse. This medication may increase blood sugar. The risk may be higher in patients who already have diabetes. Ask your care team what you can do to lower the risk of diabetes while taking this  medication. This medication may cause infertility. Talk to your care team if you are concerned about your fertility. Heart attacks and strokes have been reported with the use of this medication. Get emergency help if you develop signs or symptoms of a heart attack or stroke. Talk to your care team about the risks and benefits of this medication. What side effects may I notice from receiving this medication? Side effects that you should report to your care team as soon as possible: Allergic reactions--skin rash, itching, hives, swelling of the face, lips, tongue, or throat Heart attack--pain or tightness in the chest, shoulders, arms, or jaw, nausea, shortness of breath, cold or clammy skin, feeling faint or lightheaded Heart rhythm changes--fast or irregular heartbeat, dizziness, feeling faint or lightheaded, chest pain, trouble breathing High blood sugar (hyperglycemia)--increased thirst or amount of urine, unusual weakness or fatigue, blurry vision Mood swings, irritability, hostility Seizures Stroke--sudden numbness or weakness of the face, arm, or leg, trouble speaking, confusion, trouble walking, loss of balance or coordination, dizziness, severe headache, change in vision Thoughts of suicide or self-harm, worsening mood, feelings of depression Side effects that usually do not require medical attention (report to your care team if they continue or are bothersome): Bone pain Change in sex drive or performance General discomfort and fatigue Hot flashes Muscle pain Pain, redness, or irritation at injection site Swelling of the ankles, hands, or feet This list may not describe all possible side effects. Call your doctor for medical advice about side effects. You may report side effects to FDA at 1-800-FDA-1088. Where should I keep my medication? This medication is given in a hospital or clinic. It will not be stored at home. NOTE: This sheet is a summary. It may not cover all possible  information. If you have questions about this medicine, talk to your doctor, pharmacist, or health care provider.  2023 Elsevier/Gold Standard (2021-10-19 00:00:00)

## 2022-04-23 NOTE — Addendum Note (Signed)
Addended by: Wyatt Portela on: 04/23/2022 10:26 AM   Modules accepted: Orders

## 2022-04-24 LAB — PROSTATE-SPECIFIC AG, SERUM (LABCORP): Prostate Specific Ag, Serum: <0.1 ng/mL (ref 0.0–4.0)

## 2022-04-27 ENCOUNTER — Telehealth: Payer: Self-pay | Admitting: *Deleted

## 2022-04-27 NOTE — Telephone Encounter (Signed)
PC to patient, informed him of Dr Shadad's message below, he verbalizes understanding. 

## 2022-04-27 NOTE — Telephone Encounter (Signed)
-----   Message from Matthew Portela, MD sent at 04/27/2022  8:52 AM EDT ----- Please let him know his PSA is still low

## 2022-05-10 ENCOUNTER — Other Ambulatory Visit: Payer: Self-pay | Admitting: Oncology

## 2022-05-10 ENCOUNTER — Other Ambulatory Visit (HOSPITAL_COMMUNITY): Payer: Self-pay

## 2022-05-10 DIAGNOSIS — C61 Malignant neoplasm of prostate: Secondary | ICD-10-CM

## 2022-05-10 MED ORDER — ABIRATERONE ACETATE 250 MG PO TABS
ORAL_TABLET | Freq: Every day | ORAL | 0 refills | Status: DC
  Filled 2022-05-12: qty 120, 30d supply, fill #0

## 2022-05-12 ENCOUNTER — Other Ambulatory Visit (HOSPITAL_COMMUNITY): Payer: Self-pay

## 2022-05-17 ENCOUNTER — Ambulatory Visit: Payer: Medicare PPO | Admitting: Internal Medicine

## 2022-05-17 ENCOUNTER — Other Ambulatory Visit: Payer: Self-pay

## 2022-05-17 ENCOUNTER — Encounter: Payer: Self-pay | Admitting: Internal Medicine

## 2022-05-17 VITALS — BP 166/83 | HR 57 | Temp 98.6°F | Resp 16 | Wt 200.4 lb

## 2022-05-17 DIAGNOSIS — I330 Acute and subacute infective endocarditis: Secondary | ICD-10-CM

## 2022-05-17 NOTE — Progress Notes (Signed)
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 05/14/2020   History of streptococcal septicemia 05/13/2020   (HFpEF) heart failure with preserved ejection fraction ([place]) 05/13/2020   Malignant neoplasm of prostate ([place]) 06/23/2018   Goals of care, counseling/discussion 06/23/2018   Aortic valve disease s/p Bioprosthetic AVR, Ao Root Replacment, Repair of Ao to PA fistula 04/2020    Left inguinal hernia 09/04/2015   Alcoholism ([place]) 06/10/2014   Anemia of chronic disease 05/23/2014   HEMORRHAGE OF RECTUM AND ANUS 12/15/2008   Erectile dysfunction 12/14/2007   SEBORRHEIC KERATOSIS, INFLAMED 08/03/2007   NEOPLASM, SKIN, UNCERTAIN BEHAVIOR 71/69/6789   Hypothyroidism 07/06/2007   Diabetes mellitus ([place]) 07/06/2007   Hyperlipidemia LDL goal <70 07/06/2007   Essential hypertension 07/06/2007    Patient's Medications  New Prescriptions   No medications on file  Previous Medications   ABIRATERONE ACETATE (ZYTIGA) 250 MG TABLET    TAKE 4 TABLETS (1,000 MG TOTAL) BY MOUTH DAILY. TAKE ON AN EMPTY STOMACH 1 HOUR BEFORE OR 2 HOURS AFTER A MEAL   ASPIRIN EC 81 MG TABLET    Take 81 mg by mouth daily. Swallow whole.   ATORVASTATIN (LIPITOR) 80 MG TABLET    TAKE 1 TABLET EVERY DAY   CALCIUM CARBONATE (CALCIUM 600 PO)    Take 1,200 mg by mouth daily.   CHOLECALCIFEROL (VITAMIN D3) 25 MCG (1000 UT) TABLET    Take 1,000 Units by mouth daily.   FERROUS SULFATE 325 (65 FE) MG  EC TABLET    Take 1 tablet (325 mg total) by mouth 2 (two) times daily before a meal.   GLIPIZIDE (GLUCOTROL XL) 10 MG 24 HR TABLET    TAKE 1 TABLET TWICE DAILY   LABETALOL (NORMODYNE) 200 MG TABLET    TAKE 3 TABLETS EVERY MORNING AND TAKE 2 TABLETS EVERY EVENING   LEVOTHYROXINE (SYNTHROID) 125 MCG TABLET    Take 1 tablet (125 mcg total) by mouth daily.   METFORMIN (GLUCOPHAGE) 1000 MG TABLET    TAKE 1 TABLET TWICE DAILY WITH MEALS   METOPROLOL TARTRATE (LOPRESSOR) 50 MG TABLET    Take 1 tablet by mouth two hours prior to scan   OMEPRAZOLE (PRILOSEC) 40 MG CAPSULE    Take 1 tablet by mouth twice a day X's 2 weeks then reduce back to 1 tablet by mouth daily   POTASSIUM CHLORIDE SA (KLOR-CON M) 20 MEQ TABLET    Take 1 tablet (20 mEq total) by mouth daily.   PREDNISONE (DELTASONE) 5 MG TABLET    TAKE 1 TABLET BY MOUTH ONCE A DAY WITH BREAKFAST   SPIRONOLACTONE (ALDACTONE) 25 MG TABLET    Take 1 tablet (25 mg total) by mouth every other day.   TORSEMIDE (DEMADEX) 20 MG TABLET    Take 1 tablet (20 mg total) by mouth daily.  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  Dental extractions on 12/31 test 2021 presents for hospital follow-up.  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 Aorto-PA fistula is present with L to R shunting. Thickening at the base of the RCC/LCC leaflets which may represent vegetation. 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 Dr. Henderson(CTS) review of echo on 5/22 had shown cavity of old abscess,  mass significant on recent echo.  Given the leukocytosis, fever suspected to be chronic process.  Patient was initially started on Dapto and ceftriaxone.  TEE showed pseudoaneurysm before RCC/LCC impersistent your PA fistula, equal lucent space with concern for aortic root abscess.  Discharged on ceftriaxone to target previously grown strep species, EOT 8/18.  CTS reviewed TEE noted findings as expected.  Repair would involve redo root and reconstruction outflow tract, closure of PA fistula.  Difficult to assess how complex repair will hold up in his case.  Plan to touch base with cardiology(Dr. Burt Knack) for best options.  8/15: Pt reports he feels well. Denies fever and chills.   Today 05/17/22: Pt reports feeling well. Denies fevers and chills. PICC line is out.   Review of Systems: Review of Systems  All other systems reviewed and are negative.   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[place]) 05/14/2020   History of nuclear stress test    Myoview 8/18: EF 64, inferobasal thinning, no ischemia, low risk   HYPERLIPIDEMIA 07/06/2007   HYPERTENSION 07/06/2007   HYPOTHYROIDISM 07/06/2007   Leg pain    ABIs 8/18:  Normal    Obesity    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   23 mm human aortic root graft with 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 and reimplantation of left main and right coronary arteries   Severe aortic insufficiency 05/13/2020   acute onset in setting of bacterial endocarditis   Severe aortic stenosis     Social History   Tobacco Use   Smoking status: Former   Smokeless tobacco: Never   Tobacco comments:    quit 2005  Vaping Use   Vaping Use: Never used   Substance Use Topics   Alcohol use: Not Currently    Alcohol/week: 14.0 standard drinks of alcohol    Types: 14 Cans of beer per week   Drug use: No    Family History  Problem Relation Age of Onset   Hypertension Other    Breast cancer Sister    Breast cancer Other        family history   Breast cancer Brother        prostate ca   Aneurysm Mother        Brain aneurysm    Prostate cancer Father    Colon cancer Neg Hx    Esophageal cancer Neg Hx    Stomach cancer  Neg Hx    Rectal cancer Neg Hx     No Known Allergies  Health Maintenance  Topic Date Due   OPHTHALMOLOGY EXAM  09/25/2014   Diabetic kidney evaluation - Urine ACR  04/28/2017   COVID-19 Vaccine (4 - Pfizer risk series) 09/19/2020   FOOT EXAM  01/22/2022   INFLUENZA VACCINE  03/23/2022   HEMOGLOBIN A1C  08/29/2022   Diabetic kidney evaluation - GFR measurement  04/24/2023   COLONOSCOPY (Pts 45-65yr Insurance coverage will need to be confirmed)  05/04/2023   Pneumonia Vaccine 75 Years old  Completed   Hepatitis C Screening  Completed   Zoster Vaccines- Shingrix  Completed   HPV VACCINES  Aged Out    Objective:  Vitals:   05/17/22 0902  Weight: 200 lb 6.4 oz (90.9 kg)   Body mass index is 30.47 kg/m.  Physical Exam Constitutional:      General: He is not in acute distress.    Appearance: He is normal weight. He is not toxic-appearing.  HENT:     Head: Normocephalic and atraumatic.     Right Ear: External ear normal.     Left Ear: External ear normal.     Nose: No congestion or rhinorrhea.     Mouth/Throat:     Mouth: Mucous membranes are moist.     Pharynx: Oropharynx is clear.  Eyes:     Extraocular Movements: Extraocular movements intact.     Conjunctiva/sclera: Conjunctivae normal.     Pupils: Pupils are equal, round, and reactive to light.  Cardiovascular:     Rate and Rhythm: Normal rate and regular rhythm.     Heart sounds: No murmur heard.    No friction rub. No gallop.   Pulmonary:     Effort: Pulmonary effort is normal.     Breath sounds: Normal breath sounds.  Abdominal:     General: Abdomen is flat. Bowel sounds are normal.     Palpations: Abdomen is soft.  Musculoskeletal:        General: No swelling. Normal range of motion.     Cervical back: Normal range of motion and neck supple.  Skin:    General: Skin is warm and dry.  Neurological:     General: No focal deficit present.     Mental Status: He is oriented to person, place, and time.  Psychiatric:        Mood and Affect: Mood normal.     Lab Results Lab Results  Component Value Date   WBC 7.1 04/23/2022   HGB 11.6 (L) 04/23/2022   HCT 34.9 (L) 04/23/2022   MCV 86.8 04/23/2022   PLT 216 04/23/2022    Lab Results  Component Value Date   CREATININE 1.12 04/23/2022   BUN 25 (H) 04/23/2022   NA 142 04/23/2022   K 4.1 04/23/2022   CL 105 04/23/2022   CO2 29 04/23/2022    Lab Results  Component Value Date   ALT 16 04/23/2022   AST 17 04/23/2022   ALKPHOS 53 04/23/2022   BILITOT 1.0 04/23/2022    Lab Results  Component Value Date   CHOL 133 07/30/2021   HDL 62.00 07/30/2021   LDLCALC 56 07/30/2021   LDLDIRECT 105.0 04/28/2016   TRIG 75.0 07/30/2021   CHOLHDL 2 07/30/2021   No results found for: "LABRPR", "RPRTITER" No results found for: "HIV1RNAQUANT", "HIV1RNAVL", "CD4TABS"   A/P #Presumed prosthetic aortic valve endocarditis #Hx of AV endocarditis with strep anginosus SP AVR on 05/15/20 -Admitted  as CT coronary done by cardiology showed  extensive aortic root abscess with pseudoaneurysm of the LVOT. Aorto-PA fistula is present with L -> R shunting. Thickening at the base of the RCC/LCC leaflets which may represent vegetation.  -TEE during admission  showed pseudoaneurysm before RCC/LCC impersistent your PA fistula, equal lucent space with concern for aortic root abscess.  -No fever /leukocytosis. Blood Cx negative -CTS (Dr. Roxan Hockey) reviewed TEE noted findings as  expected.  Repair would involve redo root and reconstruction outflow tract, closure of PA fistula.  Difficult to assess how complex repair will hold up in his case.  Discussion ongoing with Cards.  -Patient was seen by Dr. Burt Knack, cardiology on 8/2 referral to tertiary care center was discussed, patient not interested due to likely unfavorable outcome.  Plan to repeat echo, CTA in 6 months. -Completed ceftriaxone x 6 weeks (targeting previous grown strep) EOT 8/18. Pulled PICC after completion of antibiotics.  Plan: Labs today -Cards f/u October, pt reports CTA in January -Follow-up with ID in 6 months   #Advanced prostate Cx on Zytega -Followed by Dr. Alen Blew -PSA continues to be undetectable  I have personally spent 45 minutes involved in face-to-face and non-face-to-face activities for this patient on the day of the visit. Professional time spent includes the following activities: Preparing to see the patient (review of tests), Obtaining and/or reviewing separately obtained history (admission/discharge record), Performing a medically appropriate examination and/or evaluation , Ordering medications/tests/procedures, referring and communicating with other health care professionals, Documenting clinical information in the EMR, Independently interpreting results (not separately reported), Communicating results to the patient/family/caregiver, Counseling and educating the patient/family/caregiver and Care coordination (not separately reported).   Laurice Record, MD [place] [facility] 05/17/2022, 9:04 AM

## 2022-05-18 ENCOUNTER — Other Ambulatory Visit: Payer: Self-pay | Admitting: Adult Health

## 2022-05-18 DIAGNOSIS — E039 Hypothyroidism, unspecified: Secondary | ICD-10-CM

## 2022-05-18 LAB — COMPLETE METABOLIC PANEL WITH GFR
AG Ratio: 2.4 (calc) (ref 1.0–2.5)
ALT: 16 U/L (ref 9–46)
AST: 17 U/L (ref 10–35)
Albumin: 4 g/dL (ref 3.6–5.1)
Alkaline phosphatase (APISO): 50 U/L (ref 35–144)
BUN: 20 mg/dL (ref 7–25)
CO2: 28 mmol/L (ref 20–32)
Calcium: 8.9 mg/dL (ref 8.6–10.3)
Chloride: 108 mmol/L (ref 98–110)
Creat: 1.08 mg/dL (ref 0.70–1.28)
Globulin: 1.7 g/dL (calc) — ABNORMAL LOW (ref 1.9–3.7)
Glucose, Bld: 94 mg/dL (ref 65–99)
Potassium: 4.8 mmol/L (ref 3.5–5.3)
Sodium: 144 mmol/L (ref 135–146)
Total Bilirubin: 0.9 mg/dL (ref 0.2–1.2)
Total Protein: 5.7 g/dL — ABNORMAL LOW (ref 6.1–8.1)
eGFR: 72 mL/min/{1.73_m2} (ref >=60)

## 2022-05-18 LAB — CBC WITH DIFFERENTIAL/PLATELET
Absolute Monocytes: 482 cells/uL (ref 200–950)
Basophils Absolute: 60 cells/uL (ref 0–200)
Basophils Relative: 0.9 %
Eosinophils Absolute: 67 cells/uL (ref 15–500)
Eosinophils Relative: 1 %
HCT: 33.4 % — ABNORMAL LOW (ref 38.5–50.0)
Hemoglobin: 10.6 g/dL — ABNORMAL LOW (ref 13.2–17.1)
Lymphs Abs: 1380 cells/uL (ref 850–3900)
MCH: 28.4 pg (ref 27.0–33.0)
MCHC: 31.7 g/dL — ABNORMAL LOW (ref 32.0–36.0)
MCV: 89.5 fL (ref 80.0–100.0)
MPV: 11.7 fL (ref 7.5–12.5)
Monocytes Relative: 7.2 %
Neutro Abs: 4710 cells/uL (ref 1500–7800)
Neutrophils Relative %: 70.3 %
Platelets: 180 10*3/uL (ref 140–400)
RBC: 3.73 10*6/uL — ABNORMAL LOW (ref 4.20–5.80)
RDW: 13 % (ref 11.0–15.0)
Total Lymphocyte: 20.6 %
WBC: 6.7 10*3/uL (ref 3.8–10.8)

## 2022-05-18 LAB — C-REACTIVE PROTEIN: CRP: 0.6 mg/L (ref <8.0)

## 2022-05-18 LAB — SEDIMENTATION RATE: Sed Rate: 6 mm/h (ref 0–20)

## 2022-05-20 ENCOUNTER — Other Ambulatory Visit (HOSPITAL_COMMUNITY): Payer: Self-pay

## 2022-05-20 ENCOUNTER — Ambulatory Visit (INDEPENDENT_AMBULATORY_CARE_PROVIDER_SITE_OTHER): Payer: Medicare PPO | Admitting: *Deleted

## 2022-05-20 DIAGNOSIS — Z23 Encounter for immunization: Secondary | ICD-10-CM | POA: Diagnosis not present

## 2022-05-24 ENCOUNTER — Encounter: Payer: Self-pay | Admitting: Cardiovascular Disease

## 2022-05-24 ENCOUNTER — Ambulatory Visit: Payer: Medicare PPO | Attending: Cardiovascular Disease | Admitting: Cardiovascular Disease

## 2022-05-24 VITALS — BP 114/60 | HR 60 | Ht 68.0 in | Wt 195.0 lb

## 2022-05-24 DIAGNOSIS — Z952 Presence of prosthetic heart valve: Secondary | ICD-10-CM

## 2022-05-24 NOTE — Patient Instructions (Signed)
Important Information About Sugar

## 2022-05-24 NOTE — Progress Notes (Signed)
Pt erroneously scheduled. Appointment cancelled - he has 6 m follow-up arranged as planned.

## 2022-06-04 ENCOUNTER — Other Ambulatory Visit (HOSPITAL_COMMUNITY): Payer: Self-pay

## 2022-06-04 ENCOUNTER — Other Ambulatory Visit: Payer: Self-pay | Admitting: Oncology

## 2022-06-04 ENCOUNTER — Telehealth: Payer: Self-pay | Admitting: Pharmacist

## 2022-06-04 DIAGNOSIS — C61 Malignant neoplasm of prostate: Secondary | ICD-10-CM

## 2022-06-04 MED ORDER — ABIRATERONE ACETATE 250 MG PO TABS
ORAL_TABLET | Freq: Every day | ORAL | 0 refills | Status: DC
  Filled 2022-06-04: qty 120, 30d supply, fill #0

## 2022-06-04 NOTE — Chronic Care Management (AMB) (Unsigned)
Chronic Care Management Pharmacy Assistant   Name: Matthew Serrano.  MRN: [id number] DOB: 16-Feb-1946  Reason for Encounter: Disease State / Hypertension and Diabetes Assessment Call   Conditions to be addressed/monitored: HTN and DMII  Recent office visits:   None  Recent consult visits:  05/24/2022 [name] MD (cardiology) - Patient was seen for S/P AVR. No medication changes. Follow up in 6 months.  05/17/2022 Laurice Record MD (infectious disease) - Patient was seen for Acute infective endocarditis, due to unspecified organism. No medication changes. Follow up in 6 months.   04/23/2022 [name] MD (oncology) - Patient was seen for Malignant neoplasm of prostate. No medication changes. Follow up in 4 months.   Hospital visits:  None  Medications: Outpatient Encounter Medications as of 06/04/2022  Medication Sig   abiraterone acetate (ZYTIGA) 250 MG tablet TAKE 4 TABLETS (1,000 MG TOTAL) BY MOUTH DAILY. TAKE ON AN EMPTY STOMACH 1 HOUR BEFORE OR 2 HOURS AFTER A MEAL   aspirin EC 81 MG tablet Take 81 mg by mouth daily. Swallow whole.   atorvastatin (LIPITOR) 80 MG tablet TAKE 1 TABLET EVERY DAY   Calcium Carbonate (CALCIUM 600 PO) Take 1,200 mg by mouth daily.   cholecalciferol (VITAMIN D3) 25 MCG (1000 UT) tablet Take 1,000 Units by mouth daily.   ferrous sulfate 325 (65 FE) MG EC tablet Take 1 tablet (325 mg total) by mouth 2 (two) times daily before a meal.   glipiZIDE (GLUCOTROL XL) 10 MG 24 hr tablet TAKE 1 TABLET TWICE DAILY   labetalol (NORMODYNE) 200 MG tablet TAKE 3 TABLETS EVERY MORNING AND TAKE 2 TABLETS EVERY EVENING   levothyroxine (SYNTHROID) 125 MCG tablet TAKE 1 TABLET EVERY DAY   metFORMIN (GLUCOPHAGE) 1000 MG tablet TAKE 1 TABLET TWICE DAILY WITH MEALS   metoprolol tartrate (LOPRESSOR) 50 MG tablet Take 1 tablet by mouth two hours prior to scan   omeprazole (PRILOSEC) 40 MG capsule Take 1 tablet by mouth twice a day X's 2 weeks then reduce back to 1  tablet by mouth daily   potassium chloride SA (KLOR-CON M) 20 MEQ tablet Take 1 tablet (20 mEq total) by mouth daily.   predniSONE (DELTASONE) 5 MG tablet TAKE 1 TABLET BY MOUTH ONCE A DAY WITH BREAKFAST   spironolactone (ALDACTONE) 25 MG tablet Take 1 tablet (25 mg total) by mouth every other day.   torsemide (DEMADEX) 20 MG tablet Take 1 tablet (20 mg total) by mouth daily.   No facility-administered encounter medications on file as of 06/04/2022.  Fill History:  abiraterone acetate (ZYTIGA) 250 MG tablet 05/20/2022 30   atorvastatin 80 mg tablet 04/05/2022 90   doxazosin 8 mg tablet 04/10/2022 90   glipizide ER 10 mg tablet, extended release 24 hr 04/05/2022 90   labetalol 200 mg tablet 04/05/2022 90   metformin 1,000 mg tablet 04/05/2022 90   METOPROLOL TARTRATE 50 MG TAB 03/24/2022 1   omeprazole 40 mg capsule,delayed release 05/07/2022 90   potassium chloride ER 20 mEq tablet,extended release(part/cryst) 04/11/2022 90   spironolactone 25 mg tablet 04/11/2022 90   torsemide 20 mg tablet 04/11/2022 90   Reviewed chart prior to disease state call. Spoke with patient regarding BP  Recent Office Vitals: BP Readings from Last 3 Encounters:  05/24/22 114/60  05/17/22 (!) 166/83  04/23/22 102/61   Pulse Readings from Last 3 Encounters:  05/24/22 60  05/17/22 (!) 57  04/23/22 65    Wt Readings from Last 3  Encounters:  05/24/22 195 lb (88.5 kg)  05/17/22 200 lb 6.4 oz (90.9 kg)  04/23/22 195 lb (88.5 kg)     Kidney Function Lab Results  Component Value Date/Time   CREATININE 1.08 05/17/2022 09:18 AM   CREATININE 1.12 04/23/2022 09:44 AM   CREATININE 1.22 03/17/2022 07:34 AM   CREATININE 1.51 (H) 03/03/2022 03:05 AM   CREATININE 1.31 (H) 12/25/2021 08:22 AM   CREATININE 1.13 07/29/2020 08:31 AM   GFR 58.37 (L) 03/17/2022 07:34 AM   GFRNONAA >60 04/23/2022 09:44 AM   GFRAA 99 07/03/2020 10:16 AM   GFRAA >60 04/29/2020 09:48 AM       Latest Ref Rng & Units  05/17/2022    9:18 AM 04/23/2022    9:44 AM 03/17/2022    7:34 AM  BMP  Glucose 65 - 99 mg/dL 94  136  70   BUN 7 - 25 mg/dL '20  25  24   '$ Creatinine 0.70 - 1.28 mg/dL 1.08  1.12  1.22   BUN/Creat Ratio 6 - 22 (calc) SEE NOTE:     Sodium 135 - 146 mmol/L 144  142  143   Potassium 3.5 - 5.3 mmol/L 4.8  4.1  4.0   Chloride 98 - 110 mmol/L 108  105  106   CO2 20 - 32 mmol/L '28  29  29   '$ Calcium 8.6 - 10.3 mg/dL 8.9  9.5  9.2   Recent Relevant Labs: Lab Results  Component Value Date/Time   HGBA1C 6.1 (H) 02/26/2022 06:16 PM   HGBA1C 6.2 07/30/2021 07:36 AM   HGBA1C 6.3 02/22/2020 09:29 AM   HGBA1C 6.2 10/23/2019 10:15 AM   MICROALBUR <0.7 04/28/2016 07:56 AM   MICROALBUR 0.8 11/02/2013 08:54 AM    Kidney Function Lab Results  Component Value Date/Time   CREATININE 1.08 05/17/2022 09:18 AM   CREATININE 1.12 04/23/2022 09:44 AM   CREATININE 1.22 03/17/2022 07:34 AM   CREATININE 1.51 (H) 03/03/2022 03:05 AM   CREATININE 1.31 (H) 12/25/2021 08:22 AM   CREATININE 1.13 07/29/2020 08:31 AM   GFR 58.37 (L) 03/17/2022 07:34 AM   GFRNONAA >60 04/23/2022 09:44 AM   GFRAA 99 07/03/2020 10:16 AM   GFRAA >60 04/29/2020 09:48 AM    Current antihyperglycemic regimen:  Glipizide 10 mg twice daily Metformin 1000 mg twice daily  What recent interventions/DTPs have been made to improve hypertension and glycemic control:  No recent interventions  Have there been any recent hospitalizations or ED visits since last visit with CPP? No recent hospital visits   Patient {reports/denies:24182} hypoglycemic symptoms, including {Hypoglycemic Symptoms:3049003}  Patient {reports/denies:24182} hyperglycemic symptoms, including {symptoms; hyperglycemia:17903}  How often are you checking your blood sugar? {BG Testing frequency:23922}  What are your blood sugars ranging?  Fasting: *** Before meals: *** After meals: *** Bedtime: ***  During the week, how often does your blood glucose drop below 70?  {LowBGfrequency:24142}  Are you checking your feet daily/regularly? ***  Current antihypertensive regimen:  Lebetalol 200 mg 3 tabs in the AM and 2 tabs in the PM Spironolactone 25 mg daily  How often are you checking your Blood Pressure? {CHL HP BP Monitoring [phone]}  Current home BP readings: ***  What diet changes have been made to improve Blood Pressure Control?  Patient follows Breakfast - patient Lunch - patient  Dinner - patient  What exercise is being done to improve your Blood Pressure Control?  ***  Adherence Review: Is the patient currently on a STATIN medication? Yes  Is the patient currently on ACE/ARB medication? No Does the patient have >5 day gap between last estimated fill dates? No   Care Gaps: AWV - scheduled 11/08/2022 Last BP - 114/60 on 05/24/2022 Last A1C - 6.1 on 02/26/2022 Eye exam - overdue Urine ACR - overdue Covid - overdue Foot exam - overdue  Star Rating Drugs: Atorvastatin 80 mg - last filled 04/05/2022 90 DS at [facility] Glipizide 10 mg - last filled 04/05/2022 90 DS at [facility] Metformin 1000 mg - last filled 04/05/2022 90 DS at [place] [phone]

## 2022-06-14 ENCOUNTER — Encounter (HOSPITAL_COMMUNITY): Payer: Self-pay | Admitting: Internal Medicine

## 2022-06-14 ENCOUNTER — Telehealth: Payer: Self-pay | Admitting: Pharmacy Technician

## 2022-06-14 ENCOUNTER — Other Ambulatory Visit: Payer: Self-pay | Admitting: Oncology

## 2022-06-14 ENCOUNTER — Telehealth: Payer: Self-pay

## 2022-06-14 ENCOUNTER — Other Ambulatory Visit (HOSPITAL_COMMUNITY): Payer: Self-pay

## 2022-06-14 ENCOUNTER — Encounter: Payer: Self-pay | Admitting: Oncology

## 2022-06-14 DIAGNOSIS — C61 Malignant neoplasm of prostate: Secondary | ICD-10-CM

## 2022-06-14 MED ORDER — ENZALUTAMIDE 40 MG PO TABS
160.0000 mg | ORAL_TABLET | Freq: Every day | ORAL | 3 refills | Status: DC
  Filled 2022-06-14: qty 120, 30d supply, fill #0

## 2022-06-14 NOTE — Telephone Encounter (Signed)
Oral Oncology Patient Advocate Encounter  Completed application for XTANDI in an effort to reduce patient's out of pocket expense to $0.    Application completed online and submitted to [employer].   [place] patient assistance phone number for follow up is [phone].   This encounter will be updated until final determination.    Lady Deutscher, CPhT-Adv Oncology Pharmacy Patient [name] Direct Number: [phone]  Fax: [phone]

## 2022-06-14 NOTE — Telephone Encounter (Signed)
Oral Oncology Patient Advocate Encounter  Prior Authorization for [facility]y has been approved.    PA# [id number] Effective dates: 06/14/2022 through 08/23/2023  Patients co-pay is $730.27.    Lady Deutscher, CPhT-Adv Oncology Pharmacy Patient [place] Direct Number: [phone]  Fax: [phone]

## 2022-06-14 NOTE — Telephone Encounter (Addendum)
Oral Oncology Pharmacist Encounter  Received new prescription for enzalutamide Matthew Serrano) for the treatment of advanced castration- resistant prostate cancer, planned duration until disease progression or unacceptable toxicity.   Labs from 05/17/2022 assessed, no interventions needed. Prescription dose and frequency assessed.  Current medication list in Epic reviewed, DDIs with Xtandi identified: - atorvastatin: concentration of atorvastatin may be decreased - glipizide: concentration of glipizide may be decreased - omeprazole: concentration of omeprazole may be decreased - torsemide: concentration of torsemide may be decreased  Evaluated chart and no patient barriers to medication adherence noted.   Patient agreement for treatment documented in MD note on 06/14/2022. Patient was previously on Zytiga and receiving through a grant although this grant has since expired and there is a high copay. Discussed with MD about switching medications. Prednisone will be discontinued.   Prescription has been e-scribed to the [facility] for benefits analysis and approval.  Oral Oncology Clinic will continue to follow for insurance authorization, copayment issues, initial counseling and start date.  Drema Halon, PharmD Hematology/Oncology Clinical Pharmacist [facility] [phone] 06/14/2022 9:30 AM

## 2022-06-14 NOTE — Telephone Encounter (Signed)
Oral Oncology Patient Advocate Encounter   Received notification that prior authorization for Matthew Serrano is required.   PA submitted on 06/14/2022 Key [id number] Status is pending     Lady Deutscher, CPhT-Adv Oncology Pharmacy Patient [place] Direct Number: [phone]  Fax: [phone]

## 2022-06-15 MED ORDER — ENZALUTAMIDE 40 MG PO TABS: 160.0000 mg | ORAL_TABLET | Freq: Every day | ORAL | 3 refills | Status: DC

## 2022-06-17 NOTE — Telephone Encounter (Signed)
Oral Oncology Patient Advocate Encounter  Received update via fax from [employer] regarding patient assistance application for [place].  Status is pending POI.   I have spoken to the patient and they agreed to bring the required documentation to the office tomorrow 06/18/22.  I will continue to check status until final determination.  Lady Deutscher, CPhT-Adv Oncology Pharmacy Patient [place] Direct Number: [phone]  Fax: [phone]

## 2022-06-18 NOTE — Telephone Encounter (Signed)
Oral Oncology Patient Advocate Encounter   POI submitted via fax to [place] phone number [phone].   I will continue to check the status until final determination.   Lady Deutscher, CPhT-Adv Oncology Pharmacy Patient [place] Direct Number: [phone]  Fax: [phone]

## 2022-06-21 ENCOUNTER — Other Ambulatory Visit: Payer: Self-pay | Admitting: Adult Health

## 2022-06-21 DIAGNOSIS — E119 Type 2 diabetes mellitus without complications: Secondary | ICD-10-CM

## 2022-06-22 NOTE — Telephone Encounter (Signed)
Oral Oncology Patient Advocate Encounter   Received notification that the application for assistance for Xtandi through [employer] has been denied due to income limit exceeded.   [employer] number [phone].   Options for alternate routes of treatment will  be discussed with provider and team.   Lady Deutscher, CPhT-Adv Oncology Pharmacy Patient [place] Direct Number: [phone]  Fax: [phone]

## 2022-06-23 ENCOUNTER — Other Ambulatory Visit (HOSPITAL_COMMUNITY): Payer: Self-pay

## 2022-06-23 ENCOUNTER — Other Ambulatory Visit: Payer: Self-pay | Admitting: Oncology

## 2022-06-23 ENCOUNTER — Telehealth: Payer: Self-pay | Admitting: Pharmacy Technician

## 2022-06-23 ENCOUNTER — Encounter (HOSPITAL_COMMUNITY): Payer: Self-pay | Admitting: Internal Medicine

## 2022-06-23 ENCOUNTER — Encounter: Payer: Self-pay | Admitting: Oncology

## 2022-06-23 MED ORDER — ABIRATERONE ACETATE 250 MG PO TABS
ORAL_TABLET | ORAL | 0 refills | Status: DC
  Filled 2022-06-23: qty 30, 30d supply, fill #0
  Filled 2022-06-23: qty 120, fill #0

## 2022-06-23 NOTE — Telephone Encounter (Addendum)
Oral Oncology Pharmacist Encounter  Due to xtandi application being denied as xtandi has moved to a 300% above poverty level income percentage compared to the previous 500%. Spoke with Dr. Alen Blew for next steps and asked his thoughts on doing the low dose zytiga with a low-fat breakfast.   Patient returned my call to discuss restarting the zytiga. Patient notified that the zytiga will be slightly different this time as he will only take 1 tablet a day with a low-fat breakfast. We discussed why it is different than what he was taking previously and how zytiga works. He stated he understood why he is taking less and needs a low-fat meal with it. I went over examples of low fat breakfast options for him to chose including fat-free or low fat milk, yogurt, cheese, fresh fruit, oatmeal, grits, cereals without granola, whole-grain bagels and english muffins.   Patient will re-start zytiga on 11/3 and patient agreed with plan above. I informed patient to please call me if any questions arise or if he needs any help with low-fat breakfast options.  Drema Halon, PharmD Hematology/Oncology Clinical Pharmacist [facility] [phone]

## 2022-06-23 NOTE — Telephone Encounter (Signed)
Oral Oncology Patient Advocate Encounter  After completing a benefits investigation, prior authorization for Abiraterone '250mg'$  is not required at this time through [facility].  Patient's copay is $41.25.    Lady Deutscher, CPhT-Adv Oncology Pharmacy Patient [place] Direct Number: [phone]  Fax: [phone]

## 2022-06-23 NOTE — Telephone Encounter (Signed)
Patient need to schedule an ov for more refills. 

## 2022-06-24 ENCOUNTER — Other Ambulatory Visit (HOSPITAL_COMMUNITY): Payer: Self-pay

## 2022-06-24 ENCOUNTER — Encounter (HOSPITAL_COMMUNITY): Payer: Self-pay | Admitting: Internal Medicine

## 2022-06-24 ENCOUNTER — Telehealth: Payer: Self-pay | Admitting: Pharmacy Technician

## 2022-06-24 ENCOUNTER — Encounter: Payer: Self-pay | Admitting: Oncology

## 2022-06-24 NOTE — Telephone Encounter (Signed)
Oral Oncology Patient Advocate Encounter  Was successful in securing patient a $8,000 grant from [facility] to provide copayment coverage for Abiraterone.  This will keep the out of pocket expense at $0.     [employer] ID: [id number]  I have spoken with the patient.   The billing information is as follows and has been shared with [facility].    RxBin: [id number] PCN: [id number] Member ID: [id number] Group ID: [id number] Dates of Eligibility: 05/25/22 through 05/25/23  Fund:  [place], CPhT-Adv Oncology Pharmacy Patient [place] Direct Number: [phone]  Fax: [phone]

## 2022-06-25 ENCOUNTER — Other Ambulatory Visit (HOSPITAL_COMMUNITY): Payer: Self-pay

## 2022-06-28 ENCOUNTER — Telehealth: Payer: Self-pay

## 2022-06-28 NOTE — Telephone Encounter (Signed)
Per Joellen Jersey [place],RPH's request,  called pt to see how he was doing with taking 1 Zytiga rather than 4 and he said symptoms have resolved he has not noticed any other changes.  Also she wanted to see how he was doing on his low fat diet and he said he was sticking to it by eating yogurt, etc

## 2022-07-06 ENCOUNTER — Other Ambulatory Visit: Payer: Self-pay | Admitting: *Deleted

## 2022-07-06 ENCOUNTER — Telehealth: Payer: Self-pay

## 2022-07-06 ENCOUNTER — Other Ambulatory Visit (HOSPITAL_COMMUNITY): Payer: Self-pay

## 2022-07-06 DIAGNOSIS — C61 Malignant neoplasm of prostate: Secondary | ICD-10-CM

## 2022-07-06 MED ORDER — ABIRATERONE ACETATE 250 MG PO TABS
1000.0000 mg | ORAL_TABLET | Freq: Every day | ORAL | 0 refills | Status: DC
  Filled 2022-07-06: qty 120, 30d supply, fill #0

## 2022-07-06 NOTE — Telephone Encounter (Signed)
Oral Oncology Pharmacist Encounter  Matthew Serrano was obtained for patient to cover the cost of zytiga. Spoke to patient and MD and we will go back to zytiga 4 tablets by mouth daily on an empty stomach. Patient agrees he would rather go back to that schedule instead of the 1 tablet on a low fat meal.   Patient expressed understanding and was able to state he would be returning to the 4 tablets by mouth daily on an empty stomach starting tomorrow, 11/15.   RN sent in new prescription for the zytiga 4 tablets.   Drema Halon, PharmD Hematology/Oncology Clinical Pharmacist [facility] [phone]

## 2022-07-13 ENCOUNTER — Telehealth: Payer: Self-pay | Admitting: Oncology

## 2022-07-13 NOTE — Telephone Encounter (Signed)
Called patient regarding December appointment, patient is notified. 

## 2022-07-21 ENCOUNTER — Other Ambulatory Visit: Payer: Self-pay | Admitting: Physician Assistant

## 2022-07-27 ENCOUNTER — Other Ambulatory Visit (HOSPITAL_COMMUNITY): Payer: Self-pay

## 2022-07-27 ENCOUNTER — Other Ambulatory Visit: Payer: Self-pay | Admitting: Oncology

## 2022-07-27 DIAGNOSIS — C61 Malignant neoplasm of prostate: Secondary | ICD-10-CM

## 2022-07-27 MED ORDER — ABIRATERONE ACETATE 250 MG PO TABS
1000.0000 mg | ORAL_TABLET | Freq: Every day | ORAL | 0 refills | Status: DC
  Filled 2022-07-27 – 2022-07-30 (×2): qty 120, 30d supply, fill #0

## 2022-07-27 MED ORDER — PREDNISONE 5 MG PO TABS
ORAL_TABLET | Freq: Every day | ORAL | 3 refills | Status: DC
  Filled 2022-07-27 – 2022-07-30 (×2): qty 90, 90d supply, fill #0

## 2022-07-30 ENCOUNTER — Encounter: Payer: Self-pay | Admitting: *Deleted

## 2022-07-30 ENCOUNTER — Telehealth: Payer: Self-pay | Admitting: *Deleted

## 2022-07-30 ENCOUNTER — Other Ambulatory Visit (HOSPITAL_COMMUNITY): Payer: Self-pay

## 2022-07-30 NOTE — Patient Instructions (Signed)
Visit Information  Thank you for taking time to visit with me today. Please don't hesitate to contact me if I can be of assistance to you.   Following are the goals we discussed today:   Goals Addressed               This Visit's Progress     COMPLETED: No needs (pt-stated)        Care Coordination Interventions: Reviewed medications with patient and discussed adherences to all medications Reviewed scheduled/upcoming provider appointments including sufficient transportation source Screening for signs and symptoms of depression related to chronic disease state  Assessed social determinant of health barriers         Please call the care guide team at [phone] if you need to cancel or reschedule your appointment.   If you are experiencing a Mental Health or [place] or need someone to talk to, please call the Suicide and Crisis Lifeline: 988 call the [place] National Suicide Prevention Lifeline: 605-313-1056 or TTY: 575-318-5936 TTY 7400017476) to talk to a trained counselor call 1-800-273-TALK (toll free, 24 hour hotline)  Patient verbalizes understanding of instructions and care plan provided today and agrees to view in [place]. Active MyChart status and patient understanding of how to access instructions and care plan via MyChart confirmed with patient.     No further follow up required: No needs  Raina Mina, RN Care Management Coordinator [place] Office [phone]

## 2022-07-30 NOTE — Patient Outreach (Signed)
  Care Coordination   Initial Visit Note   07/30/2022 Name: Matthew Serrano. MRN: [id number] DOB: 1946/07/09  Matthew Serrano. is a 76 y.o. year old male who sees [name], [name], NP for primary care. I spoke with  Beecher Mcardle. by phone today.  What matters to the patients health and wellness today?  No needs    Goals Addressed               This Visit's Progress     COMPLETED: No needs (pt-stated)        Care Coordination Interventions: Reviewed medications with patient and discussed adherences to all medications Reviewed scheduled/upcoming provider appointments including sufficient transportation source Screening for signs and symptoms of depression related to chronic disease state  Assessed social determinant of health barriers          SDOH assessments and interventions completed:  Yes  SDOH Interventions Today    Flowsheet Row Most Recent Value  SDOH Interventions   Food Insecurity Interventions Intervention Not Indicated  Housing Interventions Intervention Not Indicated  Transportation Interventions Intervention Not Indicated  Utilities Interventions Intervention Not Indicated        Care Coordination Interventions:  Yes, provided   Follow up plan: No further intervention required.   Encounter Outcome:  Pt. Visit Completed   Raina Mina, RN Care Management Coordinator [place] Office [phone]

## 2022-08-02 ENCOUNTER — Other Ambulatory Visit: Payer: Self-pay

## 2022-08-20 ENCOUNTER — Other Ambulatory Visit: Payer: Self-pay

## 2022-08-20 ENCOUNTER — Inpatient Hospital Stay: Payer: Medicare PPO | Admitting: Oncology

## 2022-08-20 ENCOUNTER — Inpatient Hospital Stay: Payer: Medicare PPO | Attending: Oncology

## 2022-08-20 ENCOUNTER — Inpatient Hospital Stay: Payer: Medicare PPO

## 2022-08-20 VITALS — BP 140/81 | HR 73 | Temp 97.8°F | Resp 16 | Wt 199.2 lb

## 2022-08-20 DIAGNOSIS — C61 Malignant neoplasm of prostate: Secondary | ICD-10-CM

## 2022-08-20 DIAGNOSIS — Z79818 Long term (current) use of other agents affecting estrogen receptors and estrogen levels: Secondary | ICD-10-CM | POA: Diagnosis not present

## 2022-08-20 DIAGNOSIS — Z79899 Other long term (current) drug therapy: Secondary | ICD-10-CM | POA: Insufficient documentation

## 2022-08-20 DIAGNOSIS — Z7982 Long term (current) use of aspirin: Secondary | ICD-10-CM | POA: Diagnosis not present

## 2022-08-20 LAB — CBC WITH DIFFERENTIAL (CANCER CENTER ONLY)
Abs Immature Granulocytes: 0.02 10*3/uL (ref 0.00–0.07)
Basophils Absolute: 0 10*3/uL (ref 0.0–0.1)
Basophils Relative: 1 %
Eosinophils Absolute: 0.1 10*3/uL (ref 0.0–0.5)
Eosinophils Relative: 1 %
HCT: 34.2 % — ABNORMAL LOW (ref 39.0–52.0)
Hemoglobin: 10.8 g/dL — ABNORMAL LOW (ref 13.0–17.0)
Immature Granulocytes: 0 %
Lymphocytes Relative: 23 %
Lymphs Abs: 1.5 10*3/uL (ref 0.7–4.0)
MCH: 27.8 pg (ref 26.0–34.0)
MCHC: 31.6 g/dL (ref 30.0–36.0)
MCV: 88.1 fL (ref 80.0–100.0)
Monocytes Absolute: 0.5 10*3/uL (ref 0.1–1.0)
Monocytes Relative: 8 %
Neutro Abs: 4.4 10*3/uL (ref 1.7–7.7)
Neutrophils Relative %: 67 %
Platelet Count: 200 10*3/uL (ref 150–400)
RBC: 3.88 MIL/uL — ABNORMAL LOW (ref 4.22–5.81)
RDW: 15.5 % (ref 11.5–15.5)
WBC Count: 6.6 10*3/uL (ref 4.0–10.5)
nRBC: 0 % (ref 0.0–0.2)

## 2022-08-20 LAB — CMP (CANCER CENTER ONLY)
ALT: 20 U/L (ref 0–44)
AST: 24 U/L (ref 15–41)
Albumin: 4.1 g/dL (ref 3.5–5.0)
Alkaline Phosphatase: 51 U/L (ref 38–126)
Anion gap: 10 (ref 5–15)
BUN: 28 mg/dL — ABNORMAL HIGH (ref 8–23)
CO2: 27 mmol/L (ref 22–32)
Calcium: 9 mg/dL (ref 8.9–10.3)
Chloride: 103 mmol/L (ref 98–111)
Creatinine: 1.16 mg/dL (ref 0.61–1.24)
GFR, Estimated: >60 mL/min (ref >60)
Glucose, Bld: 169 mg/dL — ABNORMAL HIGH (ref 70–99)
Potassium: 3.8 mmol/L (ref 3.5–5.1)
Sodium: 140 mmol/L (ref 135–145)
Total Bilirubin: 0.6 mg/dL (ref 0.3–1.2)
Total Protein: 6.5 g/dL (ref 6.5–8.1)

## 2022-08-20 MED ORDER — LEUPROLIDE ACETATE (4 MONTH) 30 MG ~~LOC~~ KIT
30.0000 mg | PACK | Freq: Once | SUBCUTANEOUS | Status: AC
  Administered 2022-08-20: 30 mg via SUBCUTANEOUS
  Filled 2022-08-20: qty 30

## 2022-08-20 NOTE — Progress Notes (Signed)
Hematology and Oncology Follow Up Visit  Matthew Serrano [id number] May 01, 1946 76 y.o. 08/20/2022 9:44 AM [name], [name], [name], NP   Principle Diagnosis: 76 year old man with advanced prostate cancer with lymphadenopathy diagnosed in 2019.  He developed castration-resistant after initially presenting with PSA of 1170 and a Gleason score 4+5 = 9 at the time of diagnosis.   Prior Therapy:  Status post prostate biopsy obtained on 06/06/2018 which showed high-volume disease with a Gleason score 4+5 = 9.  Taxotere chemotherapy at 75 mg/m every 3 weeks started on 07/07/2018.  He he is here for cycle 6 of therapy.  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  Current therapy:   Eligard 30 mg every 4 months.  This will be repeated today and indefinitely.  Zytiga 1000 mg daily started in April 2020.    Interim History: Matthew Serrano returns today for repeat evaluation.  Since the last visit, he reports no major changes in his health.  He continues to tolerate current treatment without any major complications.  He denies any nausea, fatigue or edema.  His blood pressure remains under reasonable control.  He denies any hospitalizations or illnesses.  His performance status and quality of life remains unchanged.                   Medications: Updated on review. Current Outpatient Medications  Medication Sig Dispense Refill   abiraterone acetate (ZYTIGA) 250 MG tablet Take 4 tablets (1,000 mg total) by mouth daily. Take on an empty stomach 1 hour before or 2 hours after a meal 120 tablet 0   aspirin EC 81 MG tablet Take 81 mg by mouth daily. Swallow whole.     atorvastatin (LIPITOR) 80 MG tablet TAKE 1 TABLET EVERY DAY 90 tablet 0   Calcium Carbonate (CALCIUM 600 PO) Take 1,200 mg by mouth daily.     cholecalciferol (VITAMIN D3) 25 MCG (1000 UT) tablet Take 1,000 Units by mouth daily.     ferrous sulfate 325 (65 FE) MG  EC tablet Take 1 tablet (325 mg total) by mouth 2 (two) times daily before a meal. 60 tablet 2   glipiZIDE (GLUCOTROL XL) 10 MG 24 hr tablet TAKE 1 TABLET TWICE DAILY 60 tablet 0   labetalol (NORMODYNE) 200 MG tablet TAKE 3 TABLETS EVERY MORNING AND TAKE 2 TABLETS EVERY EVENING 450 tablet 3   levothyroxine (SYNTHROID) 125 MCG tablet TAKE 1 TABLET EVERY DAY 90 tablet 2   metFORMIN (GLUCOPHAGE) 1000 MG tablet TAKE 1 TABLET TWICE DAILY WITH MEALS 180 tablet 0   metoprolol tartrate (LOPRESSOR) 50 MG tablet Take 1 tablet by mouth two hours prior to scan 1 tablet 0   omeprazole (PRILOSEC) 40 MG capsule Take 1 capsule (40 mg total) by mouth daily. 90 capsule 1   potassium chloride SA (KLOR-CON M) 20 MEQ tablet Take 1 tablet (20 mEq total) by mouth daily. 90 tablet 3   predniSONE (DELTASONE) 5 MG tablet TAKE 1 TABLET BY MOUTH ONCE A DAY WITH BREAKFAST 90 tablet 3   spironolactone (ALDACTONE) 25 MG tablet Take 1 tablet (25 mg total) by mouth every other day.     torsemide (DEMADEX) 20 MG tablet Take 1 tablet (20 mg total) by mouth daily. 90 tablet 3   No current facility-administered medications for this visit.     Allergies: No Known Allergies      Physical Exam:       Blood  pressure (!) 140/81, pulse 73, temperature 97.8 F (36.6 C), temperature source Temporal, resp. rate 16, weight 199 lb 3.2 oz (90.4 kg), SpO2 100 %.       ECOG: 0    General appearance: Alert, awake without any distress. Head: Atraumatic without abnormalities Oropharynx: Without any thrush or ulcers. Eyes: No scleral icterus. Lymph nodes: No lymphadenopathy noted in the cervical, supraclavicular, or axillary nodes Heart:regular rate and rhythm, without any murmurs or gallops.   Lung: Clear to auscultation without any rhonchi, wheezes or dullness to percussion. Abdomin: Soft, nontender without any shifting dullness or ascites. Musculoskeletal: No clubbing or cyanosis. Neurological: No motor or sensory  deficits. Skin: No rashes or lesions.               Lab Results: Lab Results  Component Value Date   WBC 6.6 08/20/2022   HGB 10.8 (L) 08/20/2022   HCT 34.2 (L) 08/20/2022   MCV 88.1 08/20/2022   PLT 200 08/20/2022   PSA 0.00 Repeated and verified X2. (L) 07/30/2021     Chemistry      Component Value Date/Time   NA 144 05/17/2022 0918   NA 144 02/19/2022 0819   K 4.8 05/17/2022 0918   CL 108 05/17/2022 0918   CO2 28 05/17/2022 0918   BUN 20 05/17/2022 0918   BUN 20 02/19/2022 0819   CREATININE 1.08 05/17/2022 0918      Component Value Date/Time   CALCIUM 8.9 05/17/2022 0918   ALKPHOS 53 04/23/2022 0944   AST 17 05/17/2022 0918   AST 17 04/23/2022 0944   ALT 16 05/17/2022 0918   ALT 16 04/23/2022 0944   BILITOT 0.9 05/17/2022 0918   BILITOT 1.0 04/23/2022 0944                 Latest Reference Range & Units 08/28/21 14:34 12/25/21 08:22 04/23/22 09:44  Prostate Specific Ag, Serum 0.0 - 4.0 ng/mL <0.1 <0.1 <0.1      Impression and Plan:  76 year old man with:   1.  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   He continues to tolerate Zytiga at the current dose and schedule.  His PSA continues to be undetectable with excellent overall tolerance.  The natural course of this disease and treatment choices were also discussed.  I recommended continuing this treatment indefinitely as long as he is tolerating it.  Switching to a different androgen receptor pathway inhibitor, PARP inhibitor, Taxotere chemotherapy would be considered if he developed relapsed disease.   2.  Androgen deprivation: He is currently on Eligard which will continue indefinitely.  Complications including weight gain, hot flashes and sexual dysfunction were discussed.  He is agreeable to proceed today.   3.  Goals of care and prognosis: His disease is incurable although aggressive measures are warranted given his reasonable performance  status.  4.  Hypokalemia: This will be monitored on Zytiga and overall his potassium is within normal range.  5.  Bone health.  He received Prolia in September 2023 and will be repeated every 6 to 8 months.  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  He is agreeable to continue.    6.  Follow-up: In 4 months for repeat follow-up.   30  minutes were spent on this encounter.  The time was dedicated to updating disease status, treatment choices and outlining future plan of care discussion.        [name], MD 12/29/20239:44 AM  And

## 2022-08-21 LAB — PROSTATE-SPECIFIC AG, SERUM (LABCORP): Prostate Specific Ag, Serum: <0.1 ng/mL (ref 0.0–4.0)

## 2022-08-24 ENCOUNTER — Other Ambulatory Visit: Payer: Self-pay | Admitting: Oncology

## 2022-08-24 ENCOUNTER — Other Ambulatory Visit: Payer: Self-pay | Admitting: Adult Health

## 2022-08-24 ENCOUNTER — Other Ambulatory Visit (HOSPITAL_COMMUNITY): Payer: Self-pay

## 2022-08-24 ENCOUNTER — Telehealth: Payer: Self-pay | Admitting: *Deleted

## 2022-08-24 DIAGNOSIS — E119 Type 2 diabetes mellitus without complications: Secondary | ICD-10-CM

## 2022-08-24 DIAGNOSIS — C61 Malignant neoplasm of prostate: Secondary | ICD-10-CM

## 2022-08-24 MED ORDER — ABIRATERONE ACETATE 250 MG PO TABS
1000.0000 mg | ORAL_TABLET | Freq: Every day | ORAL | 0 refills | Status: DC
  Filled 2022-08-24: qty 120, 30d supply, fill #0

## 2022-08-24 NOTE — Telephone Encounter (Addendum)
Contacted patient per Dr. Hazeline Junker direction in message below with information regarding his recent PSA.  Left message and CC call back # for any questions or concerns.   ----- Message from Wyatt Portela, MD sent at 08/24/2022  8:19 AM EST ----- Please let him know his PSA is still low

## 2022-09-01 ENCOUNTER — Other Ambulatory Visit: Payer: Self-pay

## 2022-09-03 ENCOUNTER — Telehealth (HOSPITAL_COMMUNITY): Payer: Self-pay | Admitting: Emergency Medicine

## 2022-09-03 NOTE — Telephone Encounter (Signed)
Attempted to call patient regarding upcoming cardiac CT appointment. Left message on voicemail with name and callback number Marchia Bond RN Navigator Cardiac Section Heart and Vascular Services [phone] Office [phone] Cell

## 2022-09-06 ENCOUNTER — Ambulatory Visit (HOSPITAL_COMMUNITY)
Admission: RE | Admit: 2022-09-06 | Discharge: 2022-09-06 | Disposition: A | Discharge status: Home / Self Care | Payer: Medicare PPO | Source: Ambulatory Visit | Attending: Cardiovascular Disease | Admitting: Cardiovascular Disease

## 2022-09-06 DIAGNOSIS — I2729 Other secondary pulmonary hypertension: Secondary | ICD-10-CM | POA: Diagnosis not present

## 2022-09-06 DIAGNOSIS — I517 Cardiomegaly: Secondary | ICD-10-CM | POA: Diagnosis not present

## 2022-09-06 DIAGNOSIS — I7121 Aneurysm of the ascending aorta, without rupture: Secondary | ICD-10-CM | POA: Diagnosis not present

## 2022-09-06 DIAGNOSIS — Z952 Presence of prosthetic heart valve: Secondary | ICD-10-CM | POA: Insufficient documentation

## 2022-09-06 MED ORDER — IOHEXOL 350 MG/ML SOLN
95.0000 mL | Freq: Once | INTRAVENOUS | Status: AC | PRN
  Administered 2022-09-06: 95 mL via INTRAVENOUS

## 2022-09-07 ENCOUNTER — Telehealth: Payer: Medicare PPO

## 2022-09-24 ENCOUNTER — Ambulatory Visit (HOSPITAL_COMMUNITY): Payer: Medicare PPO | Attending: Cardiovascular Disease

## 2022-09-24 DIAGNOSIS — Z952 Presence of prosthetic heart valve: Secondary | ICD-10-CM

## 2022-09-24 DIAGNOSIS — I7121 Aneurysm of the ascending aorta, without rupture: Secondary | ICD-10-CM

## 2022-09-24 LAB — ECHOCARDIOGRAM COMPLETE
AV Mean grad: 5.3 mmHg
AV Peak grad: 9.7 mmHg
Ao pk vel: 1.56 m/s
Area-P 1/2: 4.24 cm2
P 1/2 time: 645 msec
S' Lateral: 2.9 cm

## 2022-09-27 ENCOUNTER — Other Ambulatory Visit: Payer: Self-pay | Admitting: Adult Health

## 2022-09-27 ENCOUNTER — Telehealth: Payer: Self-pay

## 2022-09-27 DIAGNOSIS — E782 Mixed hyperlipidemia: Secondary | ICD-10-CM

## 2022-09-27 NOTE — Progress Notes (Signed)
Patient ID: Matthew Paige., male   DOB: [birth year], 77 y.o.   MRN: [id number]  Care Management & Coordination Services Pharmacy Team  Reason for Encounter: Offer to schedule with Clinical Pharmacist Theo Dills    [phone],  Call to patient to offer to schedule with Clinical Pharmacist  What concerns do you have about your medications? Patient reports none appointment scheduled    Chart Updates:  Recent office visits:  None  Recent consult visits:  08/20/22 Matthew Portela, MD (Oncology) Patient presented for Malignant neoplasm of prostate and [facility] [facility] visits:  None in previous 6 months  Medications: Outpatient Encounter Medications as of 09/27/2022  Medication Sig   abiraterone acetate (ZYTIGA) 250 MG tablet Take 4 tablets (1,000 mg total) by mouth daily. Take on an empty stomach 1 hour before or 2 hours after a meal   aspirin EC 81 MG tablet Take 81 mg by mouth daily. Swallow whole.   atorvastatin (LIPITOR) 80 MG tablet TAKE 1 TABLET EVERY DAY   Calcium Carbonate (CALCIUM 600 PO) Take 1,200 mg by mouth daily.   cholecalciferol (VITAMIN D3) 25 MCG (1000 UT) tablet Take 1,000 Units by mouth daily.   ferrous sulfate 325 (65 FE) MG EC tablet Take 1 tablet (325 mg total) by mouth 2 (two) times daily before a meal.   glipiZIDE (GLUCOTROL XL) 10 MG 24 hr tablet TAKE 1 TABLET TWICE DAILY   labetalol (NORMODYNE) 200 MG tablet TAKE 3 TABLETS EVERY MORNING AND TAKE 2 TABLETS EVERY EVENING   levothyroxine (SYNTHROID) 125 MCG tablet TAKE 1 TABLET EVERY DAY   metFORMIN (GLUCOPHAGE) 1000 MG tablet TAKE 1 TABLET TWICE DAILY WITH MEALS   metoprolol tartrate (LOPRESSOR) 50 MG tablet Take 1 tablet by mouth two hours prior to scan   omeprazole (PRILOSEC) 40 MG capsule Take 1 capsule (40 mg total) by mouth daily.   potassium chloride SA (KLOR-CON M) 20 MEQ tablet Take 1 tablet (20 mEq total) by mouth daily.   predniSONE (DELTASONE) 5 MG tablet TAKE 1 TABLET BY MOUTH ONCE A DAY WITH  BREAKFAST   spironolactone (ALDACTONE) 25 MG tablet Take 1 tablet (25 mg total) by mouth every other day.   torsemide (DEMADEX) 20 MG tablet Take 1 tablet (20 mg total) by mouth daily.   No facility-administered encounter medications on file as of 09/27/2022.    Recent vitals BP Readings from Last 3 Encounters:  08/20/22 (!) 140/81  05/24/22 114/60  05/17/22 (!) 166/83   Pulse Readings from Last 3 Encounters:  09/06/22 64  08/20/22 73  05/24/22 60   Wt Readings from Last 3 Encounters:  08/20/22 199 lb 3.2 oz (90.4 kg)  05/24/22 195 lb (88.5 kg)  05/17/22 200 lb 6.4 oz (90.9 kg)   BMI Readings from Last 3 Encounters:  08/20/22 30.29 kg/m  05/24/22 29.65 kg/m  05/17/22 30.47 kg/m    Recent lab results    Component Value Date/Time   NA 140 08/20/2022 0931   NA 144 02/19/2022 0819   K 3.8 08/20/2022 0931   CL 103 08/20/2022 0931   CO2 27 08/20/2022 0931   GLUCOSE 169 (H) 08/20/2022 0931   BUN 28 (H) 08/20/2022 0931   BUN 20 02/19/2022 0819   CREATININE 1.16 08/20/2022 0931   CREATININE 1.08 05/17/2022 0918   CALCIUM 9.0 08/20/2022 0931    Lab Results  Component Value Date   CREATININE 1.16 08/20/2022   GFR 58.37 (L) 03/17/2022   EGFR 72 05/17/2022  GFRNONAA >60 08/20/2022   GFRAA 99 07/03/2020   Lab Results  Component Value Date/Time   HGBA1C 6.1 (H) 02/26/2022 06:16 PM   HGBA1C 6.2 07/30/2021 07:36 AM   HGBA1C 6.3 02/22/2020 09:29 AM   HGBA1C 6.2 10/23/2019 10:15 AM   MICROALBUR <0.7 04/28/2016 07:56 AM   MICROALBUR 0.8 11/02/2013 08:54 AM    Lab Results  Component Value Date   CHOL 133 07/30/2021   HDL 62.00 07/30/2021   LDLCALC 56 07/30/2021   LDLDIRECT 105.0 04/28/2016   TRIG 75.0 07/30/2021   CHOLHDL 2 07/30/2021    Care Gaps: AWV- 11/02/21 Eye Exam - Overdue TDAP - Overdue Diabetic Urine - Overdue Foot Exam - Overdue COVID Booster - Overdue Lab Results  Component Value Date   HGBA1C 6.1 (H) 02/26/2022     Star Rating Drugs:   Atorvastatin 80 mg - last filled 06/23/2022 90 DS at [facility] Glipizide 10 mg - last filled 08/27/22 90 DS at [facility] Metformin 1000 mg - last filled 07/08/22 90 DS at [place] Pharmacist Assistant [phone]

## 2022-09-28 ENCOUNTER — Other Ambulatory Visit (HOSPITAL_COMMUNITY): Payer: Self-pay

## 2022-09-28 ENCOUNTER — Encounter: Payer: Self-pay | Admitting: Cardiovascular Disease

## 2022-09-28 ENCOUNTER — Ambulatory Visit: Payer: Medicare PPO | Attending: Cardiovascular Disease | Admitting: Cardiovascular Disease

## 2022-09-28 ENCOUNTER — Other Ambulatory Visit: Payer: Self-pay | Admitting: Hematology

## 2022-09-28 VITALS — BP 128/82 | HR 72 | Ht 68.0 in | Wt 203.0 lb

## 2022-09-28 DIAGNOSIS — I5032 Chronic diastolic (congestive) heart failure: Secondary | ICD-10-CM

## 2022-09-28 DIAGNOSIS — C61 Malignant neoplasm of prostate: Secondary | ICD-10-CM

## 2022-09-28 DIAGNOSIS — I7121 Aneurysm of the ascending aorta, without rupture: Secondary | ICD-10-CM

## 2022-09-28 DIAGNOSIS — Z952 Presence of prosthetic heart valve: Secondary | ICD-10-CM

## 2022-09-28 MED ORDER — ABIRATERONE ACETATE 250 MG PO TABS
1000.0000 mg | ORAL_TABLET | Freq: Every day | ORAL | 0 refills | Status: DC
  Filled 2022-09-28: qty 120, 30d supply, fill #0

## 2022-09-28 NOTE — Progress Notes (Signed)
Cardiology Office Note:    Date:  09/28/2022   ID:  Matthew Mcardle., DOB [birth year], MRN [id number]  PCP:  [name], NP   [place] Providers Cardiologist:  [name], MD Cardiology APP:  [facility]     Referring MD: [name], NP   Chief Complaint  Patient presents with   Follow-up    Aortic valve disease    History of Present Illness:    Matthew Helbert. is a 77 y.o. male with a hx of: Aortic stenosis Severe 01/2020 >> planned for AVR but pt developed SBE w/ severe AI AV bacterial endocarditis >> severe AI S/p AVR, aortic root replacement and 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 Echo 5/22: EF 55-60, Gr 2 DD, RVSP 46, severe LAE, mild MR, AVR ok (trivial AI) Heart failure with preserved ejection fraction  Coronary artery disease  Cath 6/21: mod non-obs CAD  Pulmonary hypertension Diabetes mellitus  Hypertension  Hyperlipidemia Hypothyroidism  Erectile dysfunction Metastatic prostate CA on suppression chemotherapy  Hx of LGI bleed 01/2020 Colo w cecal telangiectasias >> treated w APC S/p PRBC transfusions   The patient is here alone today.  He is doing fairly well from a cardiac perspective.  He denies chest pain, chest pressure, shortness of breath, orthopnea, PND, or heart palpitations.  He has no lightheadedness or syncope.  He does note some left ankle swelling at times.  No other cardiac-related complaints.  He has been dealing with hot flashes and night sweats on antiandrogen therapy for treatment of metastatic prostate cancer.  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[place]) 05/14/2020   History of nuclear stress test    Myoview 8/18:  EF 64, inferobasal thinning, no ischemia, low risk   HYPERLIPIDEMIA 07/06/2007   HYPERTENSION 07/06/2007   HYPOTHYROIDISM 07/06/2007   Leg pain    ABIs 8/18:  Normal    Obesity    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   23 mm human aortic root graft with 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 and reimplantation of left main and right coronary arteries   Severe aortic insufficiency 05/13/2020   acute onset in setting of bacterial endocarditis   Severe aortic stenosis     Past Surgical History:  Procedure Laterality Date   AORTIC VALVE REPLACEMENT N/A 05/15/2020   Procedure: AORTIC VALVE REPLACEMENT (AVR) SIZE 23 MM,  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;  Surgeon: Rexene Alberts, MD;  Location: [place];  Service: Open Heart Surgery;  Laterality: N/A;   ASCENDING AORTIC ROOT REPLACEMENT N/A 05/15/2020   Procedure: HUMAN ALLOGRAFT AORTIC ROOT REPLACEMENT;  Surgeon: Rexene Alberts, MD;  Location: [place];  Service: Open Heart Surgery;  Laterality: N/A;   BUBBLE STUDY  03/03/2022   Procedure: BUBBLE STUDY;  Surgeon: [name], MD;  Location: [place];  Service: Cardiovascular;;   COLONOSCOPY     COLONOSCOPY N/A 05/03/2020   Procedure: COLONOSCOPY;  Surgeon: Juanita Craver, MD;  Location: [facility] ENDOSCOPY;  Service: Endoscopy;  Laterality: N/A;   COLONOSCOPY W/ BIOPSIES AND POLYPECTOMY     ENTEROSCOPY N/A 05/02/2020   Procedure: ENTEROSCOPY;  Surgeon:  Mansouraty, Telford Nab., MD;  Location: [facility];  Service: Gastroenterology;  Laterality: N/A;   HEMOSTASIS CLIP PLACEMENT  05/03/2020   Procedure: HEMOSTASIS CLIP PLACEMENT;  Surgeon: Juanita Craver, MD;  Location: [facility]OSCOPY;  Service: Endoscopy;;   HOT HEMOSTASIS N/A 05/03/2020   Procedure: HOT HEMOSTASIS (ARGON PLASMA COAGULATION/BICAP);  Surgeon: Juanita Craver, MD;  Location: [facility];  Service: Endoscopy;  Laterality: N/A;   IR IMAGING GUIDED PORT INSERTION  07/05/2018   POLYPECTOMY      PORT-A-CATH REMOVAL Right 05/20/2020   Procedure: REMOVAL PORT-A-CATH;  Surgeon: Rexene Alberts, MD;  Location: [facility] OR;  Service: Thoracic;  Laterality: Right;   RIGHT/LEFT HEART CATH AND CORONARY ANGIOGRAPHY N/A 02/13/2020   Procedure: RIGHT/LEFT HEART CATH AND CORONARY ANGIOGRAPHY;  Surgeon: Belva Crome, MD;  Location: [place] CV LAB;  Service: Cardiovascular;  Laterality: N/A;   SUBMUCOSAL INJECTION  05/02/2020   Procedure: SUBMUCOSAL INJECTION;  Surgeon: Rush Landmark Telford Nab., MD;  Location: [facility];  Service: Gastroenterology;;   TEE WITHOUT CARDIOVERSION N/A 05/14/2020   Procedure: TRANSESOPHAGEAL ECHOCARDIOGRAM (TEE);  Surgeon: Sanda Klein, MD;  Location: [place];  Service: Cardiovascular;  Laterality: N/A;   TEE WITHOUT CARDIOVERSION N/A 05/15/2020   Procedure: TRANSESOPHAGEAL ECHOCARDIOGRAM (TEE);  Surgeon: Rexene Alberts, MD;  Location: [place];  Service: Open Heart Surgery;  Laterality: N/A;   TEE WITHOUT CARDIOVERSION N/A 03/03/2022   Procedure: TRANSESOPHAGEAL ECHOCARDIOGRAM (TEE);  Surgeon: [name], MD;  Location[facility] ENDOSCOPY;  Service: Cardiovascular;  Laterality: N/A;    Current Medications: Current Meds  Medication Sig   abiraterone acetate (ZYTIGA) 250 MG tablet Take 4 tablets (1,000 mg total) by mouth daily. Take on an empty stomach 1 hour before or 2 hours after a meal   aspirin EC 81 MG tablet Take 81 mg by mouth daily. Swallow whole.   atorvastatin (LIPITOR) 80 MG tablet TAKE 1 TABLET EVERY DAY   Calcium Carbonate (CALCIUM 600 PO) Take 1,200 mg by mouth daily.   cholecalciferol (VITAMIN D3) 25 MCG (1000 UT) tablet Take 1,000 Units by mouth daily.   ferrous sulfate 325 (65 FE) MG EC tablet Take 1 tablet (325 mg total) by mouth 2 (two) times daily before a meal.   glipiZIDE (GLUCOTROL XL) 10 MG 24 hr tablet TAKE 1 TABLET TWICE DAILY   labetalol (NORMODYNE) 200 MG tablet TAKE 3 TABLETS EVERY MORNING AND TAKE 2 TABLETS EVERY EVENING    levothyroxine (SYNTHROID) 125 MCG tablet TAKE 1 TABLET EVERY DAY   metFORMIN (GLUCOPHAGE) 1000 MG tablet TAKE 1 TABLET TWICE DAILY WITH MEALS   omeprazole (PRILOSEC) 40 MG capsule Take 1 capsule (40 mg total) by mouth daily.   potassium chloride SA (KLOR-CON M) 20 MEQ tablet Take 1 tablet (20 mEq total) by mouth daily.   predniSONE (DELTASONE) 5 MG tablet TAKE 1 TABLET BY MOUTH ONCE A DAY WITH BREAKFAST   spironolactone (ALDACTONE) 25 MG tablet Take 1 tablet (25 mg total) by mouth every other day.   torsemide (DEMADEX) 20 MG tablet Take 1 tablet (20 mg total) by mouth daily.   [DISCONTINUED] metoprolol tartrate (LOPRESSOR) 50 MG tablet Take 1 tablet by mouth two hours prior to scan     Allergies:   Patient has no known allergies.   Social History   Socioeconomic History   Marital status: Widowed    Spouse name: Not on file   Number of children: Not on file   Years of education: Not on file   Highest education level:  Not on file  Occupational History   Not on file  Tobacco Use   Smoking status: Former   Smokeless tobacco: Never   Tobacco comments:    quit 2005  Vaping Use   Vaping Use: Never used  Substance and Sexual Activity   Alcohol use: Not Currently    Alcohol/week: 14.0 standard drinks of alcohol    Types: 14 Cans of beer per week   Drug use: No   Sexual activity: Not on file  Other Topics Concern   Not on file  Social History Narrative   He works as a Freight forwarder    Not married    No kids          Social Determinants of Health   Financial Resource Strain: Low Risk  (11/02/2021)   Overall Financial Resource Strain (CARDIA)    Difficulty of Paying Living Expenses: Not hard at all  Food Insecurity: No Food Insecurity (07/30/2022)   Hunger Vital Sign    Worried About Running Out of Food in the Last Year: Never true    [place] in the Last Year: Never true  Transportation Needs: No Transportation Needs (07/30/2022)   PRAPARE - Civil engineer, contracting (Medical): No    Lack of Transportation (Non-Medical): No  Physical Activity: Sufficiently Active (11/02/2021)   Exercise Vital Sign    Days of Exercise per Week: 5 days    Minutes of Exercise per Session: 30 min  Stress: No Stress Concern Present (11/02/2021)   [place]    Feeling of Stress : Not at all  Social Connections: Socially Isolated (11/12/2020)   Social Connection and Isolation Panel [NHANES]    Frequency of Communication with Friends and Family: More than three times a week    Frequency of Social Gatherings with Friends and Family: More than three times a week    Attends Religious Services: Never    Marine scientist or Organizations: No    Attends Archivist Meetings: Never    Marital Status: Widowed     Family History: The patient's family history includes Aneurysm in his mother; Breast cancer in his brother, sister, and another family member; Hypertension in an other family member; Prostate cancer in his father. There is no history of Colon cancer, Esophageal cancer, Stomach cancer, or Rectal cancer.  ROS:   Please see the history of present illness.    All other systems reviewed and are negative.  EKGs/Labs/Other Studies Reviewed:    The following studies were reviewed today: Echo: 1. S/P previous 23 mm human allograft aortic root repair. 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 Rocking of aortic valve concerning  for instability. Aorta to PA fistula again demonstrated. Findings similar  to 02/10/22.   2. Left ventricular ejection fraction, by estimation, is 60 to 65%. The  left ventricle has normal function. The left ventricle has no regional  wall motion abnormalities. There is moderate left ventricular hypertrophy.  Left ventricular diastolic  parameters were normal.   3. Right ventricular systolic function is normal. The right ventricular  size is  normal.   4. Left atrial size was severely dilated.   5. The mitral valve is normal in structure. Mild mitral valve  regurgitation. No evidence of mitral stenosis.   6. The aortic valve has been repaired/replaced. Aortic valve  regurgitation is mild. No aortic stenosis is present. There is a 23 mm  valve present in the aortic position. Procedure Date: 05/15/20.   7. The inferior vena cava is normal in size with greater than 50%  respiratory variability, suggesting right atrial pressure of 3 mmHg.    EKG:  EKG is not ordered today.   Recent Labs: 03/17/2022: TSH 2.17 08/20/2022: ALT 20; BUN 28; Creatinine 1.16; Hemoglobin 10.8; Platelet Count 200; Potassium 3.8; Sodium 140  Recent Lipid Panel    Component Value Date/Time   CHOL 133 07/30/2021 0736   TRIG 75.0 07/30/2021 0736   HDL 62.00 07/30/2021 0736   CHOLHDL 2 07/30/2021 0736   VLDL 15.0 07/30/2021 0736   LDLCALC 56 07/30/2021 0736   LDLCALC 82 07/29/2020 0831   LDLDIRECT 105.0 04/28/2016 0756     Risk Assessment/Calculations:                Physical Exam:    VS:  BP 128/82   Pulse 72   Ht '5\' 8"'$  (1.727 m)   Wt 203 lb (92.1 kg)   SpO2 99%   BMI 30.87 kg/m     Wt Readings from Last 3 Encounters:  09/28/22 203 lb (92.1 kg)  08/20/22 199 lb 3.2 oz (90.4 kg)  05/24/22 195 lb (88.5 kg)     GEN:  Well nourished, well developed in no acute distress HEENT: Normal NECK: No JVD; No carotid bruits LYMPHATICS: No lymphadenopathy CARDIAC: RRR, 4/6 holosystolic murmur heard throughout the precordium RESPIRATORY:  Clear to auscultation without rales, wheezing or rhonchi  ABDOMEN: Soft, non-tender, non-distended MUSCULOSKELETAL:  No edema; No deformity  SKIN: Warm and dry NEUROLOGIC:  Alert and oriented x 3 PSYCHIATRIC:  Normal affect   ASSESSMENT:    1. S/P AVR   2. Chronic diastolic heart failure ([place])   3. Aneurysm of ascending aorta without rupture (HCC)    PLAN:    In order of problems listed above:  The  patient is clinically stable.  I personally reviewed his recent echo study and compared the images to the prior study.  I do not appreciate any significant changes.  He is status post aortic root repair and valve replacement with a 23 mm human allograft.  There is a pseudoaneurysm noted behind the right coronary cusp with rocking of the aortic valve.  This does not appear to be changed significantly from the previous study.  There is an aortic to PA fistula demonstrated also similar to the June 2023 study.  LV and RV function are normal.  LV and RV size are normal.  Patient has undergone formal cardiac surgical evaluation and cardiac surgery is not felt to be a good option in the setting of the patient's age, medical comorbidity with metastatic prostate cancer, and complexity of surgical repair.  The patient is very comfortable with his decision to continue with surveillance and not move forward with surgery.  He has declined referral to a tertiary center.  I think all of this is appropriate and we will continue to follow him with echo surveillance.  I would like to see him back in 6 months with an echo prior to the visit.  If his next echo is unchanged, we will move his serial imaging out to annually. Appears well compensated on his current regimen.  Continues on torsemide 20 mg daily and spironolactone 25 mg daily.  Renal function is essentially normal with a creatinine of 1.16 and potassium was 3.8 on most recent check.  The patient is developing no signs of RV volume overload or pulmonary hypertension  in the setting of aortic to PA fistula.     Medication Adjustments/Labs and Tests Ordered: Current medicines are reviewed at length with the patient today.  Concerns regarding medicines are outlined above.  No orders of the defined types were placed in this encounter.  No orders of the defined types were placed in this encounter.   Patient Instructions  Medication Instructions:  Your physician  recommends that you continue on your current medications as directed. Please refer to the Current Medication list given to you today.  *If you need a refill on your cardiac medications before your next appointment, please call your pharmacy*   Lab Work: NONE If you have labs (blood work) drawn today and your tests are completely normal, you will receive your results only by: [place] (if you have MyChart) OR A paper copy in the mail If you have any lab test that is abnormal or we need to change your treatment, we will call you to review the results.   Testing/Procedures: ECHO (in 6 months, prior to next appt) Your physician has requested that you have an echocardiogram. Echocardiography is a painless test that uses sound waves to create images of your heart. It provides your doctor with information about the size and shape of your heart and how well your heart's chambers and valves are working. This procedure takes approximately one hour. There are no restrictions for this procedure. Please do NOT wear cologne, perfume, aftershave, or lotions (deodorant is allowed). Please arrive 15 minutes prior to your appointment time.  Follow-Up: At [facility], you and your health needs are our priority.  As part of our continuing mission to provide you with exceptional heart care, we have created designated Provider Care Teams.  These Care Teams include your primary Cardiologist (physician) and Advanced Practice Providers (APPs -  Physician Assistants and Nurse Practitioners) who all work together to provide you with the care you need, when you need it.  Your next appointment:   6 month(s)  Provider:   [name], MD        Signed, [name], MD  09/28/2022 9:52 AM    Matthew Serrano

## 2022-09-28 NOTE — Patient Instructions (Signed)
Medication Instructions:  Your physician recommends that you continue on your current medications as directed. Please refer to the Current Medication list given to you today.  *If you need a refill on your cardiac medications before your next appointment, please call your pharmacy*   Lab Work: NONE If you have labs (blood work) drawn today and your tests are completely normal, you will receive your results only by: [place] (if you have MyChart) OR A paper copy in the mail If you have any lab test that is abnormal or we need to change your treatment, we will call you to review the results.   Testing/Procedures: ECHO (in 6 months, prior to next appt) Your physician has requested that you have an echocardiogram. Echocardiography is a painless test that uses sound waves to create images of your heart. It provides your doctor with information about the size and shape of your heart and how well your heart's chambers and valves are working. This procedure takes approximately one hour. There are no restrictions for this procedure. Please do NOT wear cologne, perfume, aftershave, or lotions (deodorant is allowed). Please arrive 15 minutes prior to your appointment time.  Follow-Up: At [facility], you and your health needs are our priority.  As part of our continuing mission to provide you with exceptional heart care, we have created designated Provider Care Teams.  These Care Teams include your primary Cardiologist (physician) and Advanced Practice Providers (APPs -  Physician Assistants and Nurse Practitioners) who all work together to provide you with the care you need, when you need it.  Your next appointment:   6 month(s)  Provider:   [name], MD

## 2022-09-29 ENCOUNTER — Other Ambulatory Visit (HOSPITAL_COMMUNITY): Payer: Self-pay

## 2022-09-29 ENCOUNTER — Other Ambulatory Visit: Payer: Self-pay

## 2022-09-29 ENCOUNTER — Other Ambulatory Visit: Payer: Self-pay | Admitting: Hematology

## 2022-09-29 DIAGNOSIS — C61 Malignant neoplasm of prostate: Secondary | ICD-10-CM

## 2022-09-29 MED ORDER — PREDNISONE 5 MG PO TABS
5.0000 mg | ORAL_TABLET | Freq: Every day | ORAL | 3 refills | Status: DC
  Filled 2022-09-29 – 2022-10-28 (×2): qty 90, 90d supply, fill #0
  Filled 2023-01-25: qty 90, 90d supply, fill #1
  Filled 2023-04-15: qty 90, 90d supply, fill #2
  Filled 2023-07-14: qty 90, 90d supply, fill #3

## 2022-09-30 ENCOUNTER — Other Ambulatory Visit (HOSPITAL_COMMUNITY): Payer: Self-pay

## 2022-10-01 ENCOUNTER — Other Ambulatory Visit: Payer: Self-pay

## 2022-10-04 ENCOUNTER — Other Ambulatory Visit: Payer: Self-pay

## 2022-10-05 ENCOUNTER — Other Ambulatory Visit: Payer: Self-pay

## 2022-10-05 ENCOUNTER — Encounter: Payer: Self-pay | Admitting: Hematology

## 2022-10-05 NOTE — Progress Notes (Signed)
Zometa supportive therapy plan entered per order from Dr. Burr Medico.  Staff message sent to Matthew Serrano sent to schedule pt for f/u with Dr. Burr Medico in 2-4 wks.  (See copy of staff messages below)  Matthew Serrano will update Epic on insurance approval.  Matthew Serrano, CMA will contact pt's dentist to get dental approval.     Matthew Merle, MD  Matthew Serrano, Matthew Mor, RN; Matthew Serrano Please schedule him to see me in next 2-4 weeks, and I will talk to him about zometa, please document in epic so I will remember  Thanks  Matthew Serrano       Previous Messages    ----- Message ----- From: Gardiner Coins Sent: 10/01/2022   9:12 AM EST To: Matthew Serrano; Matthew Merle, MD; * Subject: RE: PA-Supportive Therapy Plans                Eligard approved  Preferred for his plan is [facility] - pamidronate or zoledronic acid [id number])

## 2022-10-06 ENCOUNTER — Telehealth: Payer: Self-pay

## 2022-10-06 ENCOUNTER — Telehealth: Payer: Self-pay | Admitting: Hematology

## 2022-10-06 NOTE — Telephone Encounter (Addendum)
Called patient to find out who is dentist is. He stated he is not seeing a dentist any more because he had all of his teeth pulled. Spoke with Tammi Sou RN she stated that we don't need the clearance for this patient since they don't have teeth.      ----- Message from Evalee Jefferson, RN sent at 10/05/2022 12:39 PM EST ----- Regarding: Dental Clearance for [place],  Please call pt tomorrow to get the name of his dentist and then contact the pt's dentist to get dental clearance for the pt's Zometa.  You will need to fax them the dental clearance form for them to sign and complete.    Thanks!  Tammi Sou

## 2022-10-06 NOTE — Telephone Encounter (Signed)
Contacted patient to scheduled appointments. Patient is aware of appointments that are scheduled.   

## 2022-10-07 ENCOUNTER — Other Ambulatory Visit: Payer: Self-pay | Admitting: Adult Health

## 2022-10-07 DIAGNOSIS — E119 Type 2 diabetes mellitus without complications: Secondary | ICD-10-CM

## 2022-10-13 NOTE — Progress Notes (Signed)
Care Management & Coordination Services Pharmacy Note  10/18/2022 Name:  Matthew Serrano. MRN:  [id number] DOB:  01-Jul-1946  Summary: -BP 131/72 via home meter during appt -Recommend low dose ACEI for kidney protection with DM -Two low sugars in the last week (56, 68), will continue to monitor  Recommendations/Changes made from today's visit: -START lisinopril 2.'5mg'$  qd with PCP approval for kidney protection -F/U CMP lab order in 2 weeks if lisinopril started -Check BP/HR/BG daily and keep a log -Due for urine microalbumin -Notify office if low blood sugars continue  Follow up plan: DM/BP check in 7 days Pharmacist visit in 3 months   Subjective: Matthew Serrano. is an 77 y.o. year old male who is a primary patient of [name], NP.  The care coordination team was consulted for assistance with disease management and care coordination needs.    Engaged with patient by telephone for follow up visit.  Recent office visits: None  Recent consult visits: 10/05/22 Truitt Merle, MD (Oncology). Orders only, Zometa for hypocalcemia associated with malignant neoplasm of prostate treatment 09/28/22 [name], MD (Cardio), For ECHO and cardiac f/u. Removed PRN Metoprolol for scan from medication list  Hospital visits: None in previous 6 months   Objective:  Lab Results  Component Value Date   CREATININE 1.16 08/20/2022   BUN 28 (H) 08/20/2022   GFR 58.37 (L) 03/17/2022   EGFR 72 05/17/2022   GFRNONAA >60 08/20/2022   GFRAA 99 07/03/2020   NA 140 08/20/2022   K 3.8 08/20/2022   CALCIUM 9.0 08/20/2022   CO2 27 08/20/2022   GLUCOSE 169 (H) 08/20/2022    Lab Results  Component Value Date/Time   HGBA1C 6.1 (H) 02/26/2022 06:16 PM   HGBA1C 6.2 07/30/2021 07:36 AM   HGBA1C 6.3 02/22/2020 09:29 AM   HGBA1C 6.2 10/23/2019 10:15 AM   GFR 58.37 (L) 03/17/2022 07:34 AM   GFR 58.63 (L) 07/30/2021 07:36 AM   MICROALBUR <0.7 04/28/2016 07:56 AM   MICROALBUR 0.8 11/02/2013  08:54 AM    Last diabetic Eye exam:  Lab Results  Component Value Date/Time   HMDIABEYEEXA borderline ocular HTN 12/03/2010 12:00 AM    Last diabetic Foot exam: No results found for: "HMDIABFOOTEX"   Lab Results  Component Value Date   CHOL 133 07/30/2021   HDL 62.00 07/30/2021   LDLCALC 56 07/30/2021   LDLDIRECT 105.0 04/28/2016   TRIG 75.0 07/30/2021   CHOLHDL 2 07/30/2021       Latest Ref Rng & Units 08/20/2022    9:31 AM 05/17/2022    9:18 AM 04/23/2022    9:44 AM  Hepatic Function  Total Protein 6.5 - 8.1 g/dL 6.5  5.7  6.4   Albumin 3.5 - 5.0 g/dL 4.1   4.2   AST 15 - 41 U/L '24  17  17   '$ ALT 0 - 44 U/L '20  16  16   '$ Alk Phosphatase 38 - 126 U/L 51   53   Total Bilirubin 0.3 - 1.2 mg/dL 0.6  0.9  1.0     Lab Results  Component Value Date/Time   TSH 2.17 03/17/2022 07:34 AM   TSH 0.301 (L) 02/26/2022 06:16 PM   TSH 1.51 07/30/2021 07:36 AM       Latest Ref Rng & Units 08/20/2022    9:31 AM 05/17/2022    9:18 AM 04/23/2022    9:44 AM  CBC  WBC 4.0 - 10.5 K/uL 6.6  6.7  7.1   Hemoglobin 13.0 - 17.0 g/dL 10.8  10.6  11.6   Hematocrit 39.0 - 52.0 % 34.2  33.4  34.9   Platelets 150 - 400 K/uL 200  180  216     Lab Results  Component Value Date/Time   VITAMINB12 227 05/16/2014 04:04 PM    Clinical ASCVD: No  The 10-year ASCVD risk score (Arnett DK, et al., 2019) is: 32.6%   Values used to calculate the score:     Age: 51 years     Sex: Male     Is Non-Hispanic African American: Yes     Diabetic: Yes     Tobacco smoker: No     Systolic Blood Pressure: 0000000 mmHg     Is BP treated: Yes     HDL Cholesterol: 62 mg/dL     Total Cholesterol: 133 mg/dL       07/30/2022    9:45 AM 05/17/2022    9:04 AM 04/06/2022    9:41 AM  Depression screen PHQ 2/9  Decreased Interest 0 0 0  Down, Depressed, Hopeless 0 0 0  PHQ - 2 Score 0 0 0     Social History   Tobacco Use  Smoking Status Former  Smokeless Tobacco Never  Tobacco Comments   quit 2005   BP  Readings from Last 3 Encounters:  09/28/22 128/82  08/20/22 (!) 140/81  05/24/22 114/60   Pulse Readings from Last 3 Encounters:  09/28/22 72  09/06/22 64  08/20/22 73   Wt Readings from Last 3 Encounters:  09/28/22 203 lb (92.1 kg)  08/20/22 199 lb 3.2 oz (90.4 kg)  05/24/22 195 lb (88.5 kg)   BMI Readings from Last 3 Encounters:  09/28/22 30.87 kg/m  08/20/22 30.29 kg/m  05/24/22 29.65 kg/m    No Known Allergies  Medications Reviewed Today     Reviewed by Maren Reamer, RPH (Pharmacist) on 10/18/22 at [facility] List Status: <None>   Medication Order Taking? Sig Documenting Provider Last Dose Status Informant  abiraterone acetate (ZYTIGA) 250 MG tablet [id number]  Take 4 tablets (1,000 mg total) by mouth daily. Take on an empty stomach 1 hour before or 2 hours after a meal Truitt Merle, MD  Active   aspirin EC 81 MG tablet [id number] No Take 81 mg by mouth daily. Swallow whole. [provider] Taking Active Self  atorvastatin (LIPITOR) 80 MG tablet [id number] No TAKE 1 TABLET EVERY DAY [name], Cory, NP Taking Active   Calcium Carbonate (CALCIUM 600 PO) [id number] No Take 1,200 mg by mouth daily. [provider] Taking Active Self  cholecalciferol (VITAMIN D3) 25 MCG (1000 UT) tablet [id number] No Take 1,000 Units by mouth daily. [provider] Taking Active Self  ferrous sulfate 325 (65 FE) MG EC tablet [id number] No Take 1 tablet (325 mg total) by mouth 2 (two) times daily before a meal. Willia Craze, NP Taking Active Self           Med Note Broadus John, REGEENA   Tue May 13, 2020  2:46 PM)    glipiZIDE (GLUCOTROL XL) 10 MG 24 hr tablet [id number] No TAKE 1 TABLET TWICE DAILY [name], Cory, NP Taking Active   labetalol (NORMODYNE) 200 MG tablet [id number] No TAKE 3 TABLETS EVERY MORNING AND TAKE 2 TABLETS EVERY EVENING [name], Cory, NP Taking Active Self  levothyroxine (SYNTHROID) 125 MCG tablet [id number] No TAKE 1 TABLET EVERY DAY [name], [name],  NP Taking Active   metFORMIN (GLUCOPHAGE)  1000 MG tablet [id number] No TAKE 1 TABLET TWICE DAILY WITH MEALS [name], [name], NP Taking Active   omeprazole (PRILOSEC) 40 MG capsule [id number] No Take 1 capsule (40 mg total) by mouth daily. [name], MD Taking Active   potassium chloride SA (KLOR-CON M) 20 MEQ tablet [id number] No Take 1 tablet (20 mEq total) by mouth daily. Richardson Dopp T, PA-C Taking Active Self  predniSONE (DELTASONE) 5 MG tablet [id number]  Take 1 tablet (5 mg total) by mouth daily with breakfast. Truitt Merle, MD  Active   spironolactone (ALDACTONE) 25 MG tablet [id number] No Take 1 tablet (25 mg total) by mouth every other day. Margie Billet, PA-C Taking Active   torsemide (DEMADEX) 20 MG tablet [id number] No Take 1 tablet (20 mg total) by mouth daily. [name], MD Taking Active Self            SDOH:  (Social Determinants of Health) assessments and interventions performed: Yes SDOH Interventions    [place] Coordination from 10/18/2022 in [place] Telephone from 07/30/2022 in [place] Management from 01/29/2021 in [place] at [place] from 11/12/2020 in [place] at [place] Interventions Intervention Not Indicated Intervention Not Indicated -- Intervention Not Indicated  Housing Interventions -- Intervention Not Indicated -- Intervention Not Indicated  Transportation Interventions Intervention Not Indicated Intervention Not Indicated Intervention Not Indicated Intervention Not Indicated  Utilities Interventions Intervention Not Indicated Intervention Not Indicated -- --  Financial Strain Interventions -- -- Intervention Not Indicated Intervention Not Indicated  Physical Activity Interventions -- -- -- Intervention Not Indicated  Stress Interventions -- -- -- Intervention Not  Indicated  Social Connections Interventions -- -- -- Intervention Not Indicated       Medication Assistance: None required.  Patient affirms current coverage meets needs.  Medication Access: Within the past 30 days, how often has patient missed a dose of medication? None Is a pillbox or other method used to improve adherence? Yes  Factors that may affect medication adherence? no barriers identified Are meds synced by current pharmacy? No  Are meds delivered by current pharmacy? Yes  Does patient experience delays in picking up medications due to transportation concerns? No   [facility] Reviewed: Is patient disadvantaged to use [facility]?: Yes  Current Rx insurance plan: [employer] Name and location of Current pharmacy:  [address], [place] [place] [address] Phone: [phone] Fax: [phone] [facility] services reviewed with patient today?: No  Patient requests to transfer care to [facility]?: No  Reason patient declined to change pharmacies: Disadvantaged due to insurance/mail order  Compliance/Adherence/Medication fill history: Care Gaps: Diabetic eye and foot exam Urine microalbumin A1C Vaccines: COVID/ Tdap  Star-Rating Drugs: Atorvastatin '80mg'$  PDC 100% Metformin '1000mg'$  PDC 100% Glipizide XL '10mg'$  PDC 100%   Assessment/Plan   Hypertension (BP goal <130/80) -Controlled -Current treatment: Labetalol '200mg'$  3 tabs qam and 2 qpm Appropriate, Effective, Safe, Accessible Spironolactone '25mg'$  1 qod Appropriate, Effective, Safe, Accessible Torsemide '20mg'$  1 qd Appropriate, Effective, Safe, Accessible -Medications previously tried: Clonidine, Fosinopril, Lasix, Nifedipine -Current home readings: 131/72 HR 73 today, same as yesterday -Current dietary habits: mindful of salt intake -Current exercise habits: not at this time, plans on getting back into walking now that  cardiologist cleared him -Denies hypotensive/hypertensive symptoms -Educated on BP goals and  benefits of medications for prevention of heart attack, stroke and kidney damage; Daily salt intake goal < 2300 mg; Importance of home blood pressure monitoring; Proper BP monitoring technique; -Counseled to monitor BP at home daily, document, and provide log at future appointments -Counseled on diet and exercise extensively Recommended check bp daily and keep a log. -Recommend addition of low-dose ACEI such as lisinopril 2.'5mg'$  once daily for kidney protection with diabetes  Diabetes (A1c goal <7%) -Controlled -Current medications: Metformin '1000mg'$  BID Appropriate, Effective, Safe, Accessible Glipizide XL '10mg'$  1 BID Appropriate, Effective, Safe, Accessible -Medications previously tried: Pioglitazone  -Current home glucose readings fasting glucose: 68 this morning, 56 yesterday, >70 other days post prandial glucose: 123 yesterday and today -Denies hypoglycemic/hyperglycemic symptoms -Current meal patterns:  Reports no change in routine with low sugars -Current exercise: see above -Educated on A1c and blood sugar goals; Complications of diabetes including kidney damage, retinal damage, and cardiovascular disease; Prevention and management of hypoglycemic episodes; -Counseled to check feet daily and get yearly eye exams -Recommended to continue current medication -Keep log of sugars for next 7 days, if lows continue, may need to consider decreasing dose of glipizide.  [place] Pharmacist [phone]

## 2022-10-15 ENCOUNTER — Telehealth: Payer: Self-pay

## 2022-10-15 NOTE — Progress Notes (Unsigned)
[place]   [phone] [phone]   Clinic Follow up Note   Patient Care Team: [name], NP as PCP - General (Family Medicine) [name], MD as PCP - Cardiology (Cardiology) [name], MD as Attending Physician (Urology) Sharmon Revere as Physician Assistant (Cardiology) Truitt Merle, MD as Attending Physician (Hematology and Oncology) Maren Reamer, [facility] (Pharmacist)  Date of Service:  10/18/2022  CHIEF COMPLAINT: f/u of  Prostate Cancer  CURRENT THERAPY:  Eligard 30 mg every 4 months.   Zytiga 1000 mg daily and prednisone 5 mg daily started in April 2020.   ASSESSMENT:  Matthew Serrano. is a 77 y.o. male with   Malignant neoplasm of prostate ([place]) -stage IV with node metastasis, diagnosed in 2019, castration-resistan now  -Initial prostate biopsy obtained on 06/06/2018 which showed high-volume disease with a Gleason score 4+5 = 9. -first line therapy with Taxotere chemotherapy at 75 mg/m every 3 weeks started on 07/07/2018 and stopped in Feb 2020 after 6 cycles.  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 -currently on Eligard 30 mg every 4 months and Zytiga 1000 mg daily started in April 2020.  His PSA has been undetectable since then  -He is clinically doing very well, asymptomatic, will continue current treatment. -Will obtain a PSMA PET scan if his PSA increases significantly   Osteopenia  -He was started on Prolia Inlyta 2023, however his insurance does not cover it. -Will continue calcium and vitamin D, and repeat bone density scan in 2025. -I also encouraged him to have weightbearing exercise.   PLAN: -lab reviewed. -Encourage pt to stay hydrated  -Encourage the pt to exercise and take Calcium and Vitamin D supplements for bone strength. -f/u in 2 months with lab and Eligard injection.    SUMMARY OF ONCOLOGIC HISTORY: Oncology History   Malignant neoplasm of prostate ([place])  06/23/2018 Initial Diagnosis   Malignant neoplasm of prostate ([place])   07/07/2018 - 10/20/2018 Chemotherapy   The patient had pegfilgrastim (NEULASTA ONPRO KIT) injection 6 mg, 6 mg, Subcutaneous, Once, 5 of 5 cycles Administration: 6 mg (07/28/2018), 6 mg (08/18/2018), 6 mg (09/08/2018), 6 mg (09/29/2018), 6 mg (10/20/2018) pegfilgrastim-cbqv (UDENYCA) injection 6 mg, 6 mg, Subcutaneous, Once, 1 of 1 cycle Administration: 6 mg (07/08/2018) DOCEtaxel (TAXOTERE) 170 mg in sodium chloride 0.9 % 250 mL chemo infusion, 75 mg/m2 = 170 mg, Intravenous,  Once, 6 of 6 cycles Administration: 170 mg (07/07/2018), 170 mg (07/28/2018), 170 mg (08/18/2018), 170 mg (09/08/2018), 170 mg (09/29/2018), 170 mg (10/20/2018)  for chemotherapy treatment.       INTERVAL HISTORY:  Matthew Serrano. is here for a follow up of Prostate Cancer  He was last seen by  Dr.Shadad on 08/20/2022. He presents to the clinic alone. Pt state his energy level is good , appetites is good. Pt state he is a widow. Pt does not have any biological kids but has step children. Pt states he lives alone and I retired. Pt walks and mow the grass and watch a little TV.   All other systems were reviewed with the patient and are negative.  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   History of nuclear stress test    Myoview 8/18: EF 64, inferobasal thinning, no ischemia, low risk   HYPERLIPIDEMIA 07/06/2007   HYPERTENSION 07/06/2007   HYPOTHYROIDISM 07/06/2007   Leg pain    ABIs 8/18:  Normal    Obesity    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   23 mm human aortic root  graft with 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 and reimplantation of left main and right coronary arteries   Severe aortic insufficiency 05/13/2020   acute onset in setting of bacterial endocarditis   Severe aortic stenosis     SURGICAL HISTORY: Past Surgical History:  Procedure Laterality Date   AORTIC VALVE REPLACEMENT N/A 05/15/2020   Procedure: AORTIC VALVE REPLACEMENT (AVR) SIZE 23 MM,  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;  Surgeon: Rexene Alberts, MD;  Location: [place];  Service: Open Heart Surgery;  Laterality: N/A;   ASCENDING AORTIC ROOT REPLACEMENT N/A 05/15/2020   Procedure: HUMAN ALLOGRAFT AORTIC ROOT REPLACEMENT;  Surgeon: Rexene Alberts, MD;  Location: [place];  Service: Open Heart Surgery;  Laterality: N/A;   BUBBLE STUDY  03/03/2022   Procedure: BUBBLE STUDY;  Surgeon: [name], MD;  Location: [place];  Service: Cardiovascular;;   COLONOSCOPY     COLONOSCOPY N/A 05/03/2020   Procedure: COLONOSCOPY;  Surgeon: Juanita Craver, MD;  Location: [facility];  Service: Endoscopy;  Laterality: N/A;   COLONOSCOPY W/ BIOPSIES AND POLYPECTOMY     ENTEROSCOPY N/A 05/02/2020   Procedure: ENTEROSCOPY;  Surgeon: Rush Landmark Telford Nab., MD;  Location: [facility];  Service: Gastroenterology;  Laterality: N/A;   HEMOSTASIS CLIP PLACEMENT  05/03/2020   Procedure: HEMOSTASIS CLIP PLACEMENT;  Surgeon: Juanita Craver, MD;  Location: [facility];  Service: Endoscopy;;   HOT HEMOSTASIS N/A 05/03/2020   Procedure: HOT HEMOSTASIS (ARGON PLASMA COAGULATION/BICAP);  Surgeon: Juanita Craver, MD;  Location: [facility];  Service: Endoscopy;  Laterality: N/A;   IR IMAGING GUIDED PORT INSERTION  07/05/2018   POLYPECTOMY     PORT-A-CATH REMOVAL Right 05/20/2020   Procedure: REMOVAL PORT-A-CATH;  Surgeon: Rexene Alberts, MD;  Location: [facility];  Service: Thoracic;  Laterality: Right;   RIGHT/LEFT HEART CATH AND CORONARY ANGIOGRAPHY N/A 02/13/2020   Procedure: RIGHT/LEFT HEART  CATH AND CORONARY ANGIOGRAPHY;  Surgeon: Belva Crome, MD;  Location: [facility];  Service: Cardiovascular;  Laterality: N/A;   SUBMUCOSAL INJECTION  05/02/2020   Procedure: SUBMUCOSAL INJECTION;  Surgeon: Rush Landmark Telford Nab., MD;  Location: [facility];  Service: Gastroenterology;;   TEE WITHOUT CARDIOVERSION N/A 05/14/2020   Procedure: TRANSESOPHAGEAL ECHOCARDIOGRAM (TEE);  Surgeon: Sanda Klein, MD;  Location: [place];  Service: Cardiovascular;  Laterality: N/A;   TEE WITHOUT CARDIOVERSION N/A 05/15/2020   Procedure: TRANSESOPHAGEAL ECHOCARDIOGRAM (TEE);  Surgeon: Rexene Alberts, MD;  Location: [place];  Service: Open Heart Surgery;  Laterality: N/A;   TEE WITHOUT CARDIOVERSION N/A 03/03/2022   Procedure: TRANSESOPHAGEAL ECHOCARDIOGRAM (TEE);  Surgeon: [name], MD;  Location: [facility] ENDOSCOPY;  Service: Cardiovascular;  Laterality: N/A;    I have reviewed the social history and family history with the patient and they are unchanged from previous note.  ALLERGIES:  has No Known Allergies.  MEDICATIONS:  Current Outpatient Medications  Medication Sig Dispense Refill   abiraterone acetate (ZYTIGA) 250 MG tablet Take 4 tablets (1,000 mg total) by mouth daily. Take on an  empty stomach 1 hour before or 2 hours after a meal 120 tablet 2   aspirin EC 81 MG tablet Take 81 mg by mouth daily. Swallow whole.     atorvastatin (LIPITOR) 80 MG tablet TAKE 1 TABLET EVERY DAY 90 tablet 3   Calcium Carbonate (CALCIUM 600 PO) Take 1,200 mg by mouth daily.     cholecalciferol (VITAMIN D3) 25 MCG (1000 UT) tablet Take 1,000 Units by mouth daily.     ferrous sulfate 325 (65 FE) MG EC tablet Take 1 tablet (325 mg total) by mouth 2 (two) times daily before a meal. 60 tablet 2   glipiZIDE (GLUCOTROL XL) 10 MG 24 hr tablet TAKE 1 TABLET TWICE DAILY 60 tablet 3   labetalol (NORMODYNE) 200 MG tablet TAKE 3 TABLETS EVERY MORNING AND TAKE 2 TABLETS EVERY EVENING 450 tablet 3    levothyroxine (SYNTHROID) 125 MCG tablet TAKE 1 TABLET EVERY DAY 90 tablet 2   metFORMIN (GLUCOPHAGE) 1000 MG tablet TAKE 1 TABLET TWICE DAILY WITH MEALS 180 tablet 0   omeprazole (PRILOSEC) 40 MG capsule Take 1 capsule (40 mg total) by mouth daily. 90 capsule 1   potassium chloride SA (KLOR-CON M) 20 MEQ tablet Take 1 tablet (20 mEq total) by mouth daily. 90 tablet 3   predniSONE (DELTASONE) 5 MG tablet Take 1 tablet (5 mg total) by mouth daily with breakfast. 90 tablet 3   spironolactone (ALDACTONE) 25 MG tablet Take 1 tablet (25 mg total) by mouth every other day.     torsemide (DEMADEX) 20 MG tablet Take 1 tablet (20 mg total) by mouth daily. 90 tablet 3   No current facility-administered medications for this visit.    PHYSICAL EXAMINATION: ECOG PERFORMANCE STATUS: 0 - Asymptomatic  Vitals:   10/18/22 1511  BP: (!) 141/72  Pulse: 73  Resp: 18  Temp: 99.7 F (37.6 C)  SpO2: 100%   Wt Readings from Last 3 Encounters:  10/18/22 194 lb 12.8 oz (88.4 kg)  09/28/22 203 lb (92.1 kg)  08/20/22 199 lb 3.2 oz (90.4 kg)     LUNGS:(-) clear to auscultation and percussion with normal breathing effort HEART:(-)  regular rate & rhythm and no murmurs and no lower extremity edema ABDOMEN (-) :abdomen soft, non-tender and normal bowel sounds   LABORATORY DATA:  I have reviewed the data as listed    Latest Ref Rng & Units 10/18/2022    2:20 PM 08/20/2022    9:31 AM 05/17/2022    9:18 AM  CBC  WBC 4.0 - 10.5 K/uL 7.1  6.6  6.7   Hemoglobin 13.0 - 17.0 g/dL 12.0  10.8  10.6   Hematocrit 39.0 - 52.0 % 36.8  34.2  33.4   Platelets 150 - 400 K/uL 184  200  180         Latest Ref Rng & Units 10/18/2022    2:20 PM 08/20/2022    9:31 AM 05/17/2022    9:18 AM  CMP  Glucose 70 - 99 mg/dL 102  169  94   BUN 8 - 23 mg/dL 32  28  20   Creatinine 0.61 - 1.24 mg/dL 1.28  1.16  1.08   Sodium 135 - 145 mmol/L 142  140  144   Potassium 3.5 - 5.1 mmol/L 4.1  3.8  4.8   Chloride 98 - 111 mmol/L  104  103  108   CO2 22 - 32 mmol/L '25  27  28   '$ Calcium 8.9 -  10.3 mg/dL 9.3  9.0  8.9   Total Protein 6.5 - 8.1 g/dL 6.6  6.5  5.7   Total Bilirubin 0.3 - 1.2 mg/dL 0.6  0.6  0.9   Alkaline Phos 38 - 126 U/L 52  51    AST 15 - 41 U/L '18  24  17   '$ ALT 0 - 44 U/L '15  20  16       '$ RADIOGRAPHIC STUDIES: I have personally reviewed the radiological images as listed and agreed with the findings in the report. No results found.    No orders of the defined types were placed in this encounter.  All questions were answered. The patient knows to call the clinic with any problems, questions or concerns. No barriers to learning was detected. The total time spent in the appointment was 30 minutes.     Truitt Merle, MD 10/18/2022   Felicity Coyer, CMA, am acting as scribe for Truitt Merle, MD.   I have reviewed the above documentation for accuracy and completeness, and I agree with the above.

## 2022-10-15 NOTE — Progress Notes (Signed)
Care Management & Coordination Services Pharmacy Team  Reason for Encounter: Appointment Reminder  Contacted patient to confirm telephone appointment with Burman Riis, PharmD on 10/18/2022 at 1:00. Unsuccessful outreach. Left voicemail for patient to return call. Do you have any problems getting your medications?  If yes what types of problems are you experiencing?   What is your top health concern you would like to discuss at your upcoming visit?   Have you seen any other providers since your last visit with PCP?    Care Gaps: AWV - scheduled 11/08/2022 Last eye exam / retinopathy screening: 09/25/2013 Last diabetic foot exam: 01/22/2021 HGA1C - overdue, last done 02/26/2022 6.1 Tdap - overdue Urine ACR - overdue Covid - overdue   Star Rating Drugs: Atorvastatin 80 mg - last filled 09/28/2022 90 DS at [facility] Glipizide 10 mg - last filled 08/27/2022 90 DS at [facility] Metformin 1000 mg - last filled 07/08/2022 90 DS at [place] [phone]

## 2022-10-18 ENCOUNTER — Ambulatory Visit: Payer: Medicare PPO

## 2022-10-18 ENCOUNTER — Encounter: Payer: Self-pay | Admitting: Hematology

## 2022-10-18 ENCOUNTER — Inpatient Hospital Stay: Payer: Medicare PPO | Attending: Hematology

## 2022-10-18 ENCOUNTER — Inpatient Hospital Stay: Payer: Medicare PPO | Admitting: Hematology

## 2022-10-18 ENCOUNTER — Other Ambulatory Visit: Payer: Self-pay

## 2022-10-18 VITALS — BP 141/72 | HR 73 | Temp 99.7°F | Resp 18 | Ht 68.0 in | Wt 194.8 lb

## 2022-10-18 DIAGNOSIS — E039 Hypothyroidism, unspecified: Secondary | ICD-10-CM | POA: Insufficient documentation

## 2022-10-18 DIAGNOSIS — Z79899 Other long term (current) drug therapy: Secondary | ICD-10-CM | POA: Insufficient documentation

## 2022-10-18 DIAGNOSIS — E119 Type 2 diabetes mellitus without complications: Secondary | ICD-10-CM | POA: Insufficient documentation

## 2022-10-18 DIAGNOSIS — C61 Malignant neoplasm of prostate: Secondary | ICD-10-CM | POA: Insufficient documentation

## 2022-10-18 DIAGNOSIS — Z7952 Long term (current) use of systemic steroids: Secondary | ICD-10-CM | POA: Insufficient documentation

## 2022-10-18 DIAGNOSIS — Z7982 Long term (current) use of aspirin: Secondary | ICD-10-CM | POA: Insufficient documentation

## 2022-10-18 DIAGNOSIS — I10 Essential (primary) hypertension: Secondary | ICD-10-CM | POA: Insufficient documentation

## 2022-10-18 DIAGNOSIS — Z79818 Long term (current) use of other agents affecting estrogen receptors and estrogen levels: Secondary | ICD-10-CM | POA: Diagnosis not present

## 2022-10-18 DIAGNOSIS — Z7984 Long term (current) use of oral hypoglycemic drugs: Secondary | ICD-10-CM | POA: Insufficient documentation

## 2022-10-18 DIAGNOSIS — M8580 Other specified disorders of bone density and structure, unspecified site: Secondary | ICD-10-CM | POA: Diagnosis not present

## 2022-10-18 LAB — CMP (CANCER CENTER ONLY)
ALT: 15 U/L (ref 0–44)
AST: 18 U/L (ref 15–41)
Albumin: 4.2 g/dL (ref 3.5–5.0)
Alkaline Phosphatase: 52 U/L (ref 38–126)
Anion gap: 13 (ref 5–15)
BUN: 32 mg/dL — ABNORMAL HIGH (ref 8–23)
CO2: 25 mmol/L (ref 22–32)
Calcium: 9.3 mg/dL (ref 8.9–10.3)
Chloride: 104 mmol/L (ref 98–111)
Creatinine: 1.28 mg/dL — ABNORMAL HIGH (ref 0.61–1.24)
GFR, Estimated: 58 mL/min — ABNORMAL LOW (ref >60)
Glucose, Bld: 102 mg/dL — ABNORMAL HIGH (ref 70–99)
Potassium: 4.1 mmol/L (ref 3.5–5.1)
Sodium: 142 mmol/L (ref 135–145)
Total Bilirubin: 0.6 mg/dL (ref 0.3–1.2)
Total Protein: 6.6 g/dL (ref 6.5–8.1)

## 2022-10-18 LAB — CBC WITH DIFFERENTIAL (CANCER CENTER ONLY)
Abs Immature Granulocytes: 0.02 10*3/uL (ref 0.00–0.07)
Basophils Absolute: 0 10*3/uL (ref 0.0–0.1)
Basophils Relative: 1 %
Eosinophils Absolute: 0.1 10*3/uL (ref 0.0–0.5)
Eosinophils Relative: 1 %
HCT: 36.8 % — ABNORMAL LOW (ref 39.0–52.0)
Hemoglobin: 12 g/dL — ABNORMAL LOW (ref 13.0–17.0)
Immature Granulocytes: 0 %
Lymphocytes Relative: 24 %
Lymphs Abs: 1.7 10*3/uL (ref 0.7–4.0)
MCH: 29 pg (ref 26.0–34.0)
MCHC: 32.6 g/dL (ref 30.0–36.0)
MCV: 88.9 fL (ref 80.0–100.0)
Monocytes Absolute: 0.6 10*3/uL (ref 0.1–1.0)
Monocytes Relative: 8 %
Neutro Abs: 4.7 10*3/uL (ref 1.7–7.7)
Neutrophils Relative %: 66 %
Platelet Count: 184 10*3/uL (ref 150–400)
RBC: 4.14 MIL/uL — ABNORMAL LOW (ref 4.22–5.81)
RDW: 14.9 % (ref 11.5–15.5)
WBC Count: 7.1 10*3/uL (ref 4.0–10.5)
nRBC: 0 % (ref 0.0–0.2)

## 2022-10-18 MED ORDER — ABIRATERONE ACETATE 250 MG PO TABS: 1000.0000 mg | ORAL_TABLET | Freq: Every day | ORAL | 2 refills | Status: DC

## 2022-10-18 NOTE — Assessment & Plan Note (Signed)
-  stage IV with node metastasis, diagnosed in 2019, castration-resistan now  -Initial prostate biopsy obtained on 06/06/2018 which showed high-volume disease with a Gleason score 4+5 = 9. -first line therapy with Taxotere chemotherapy at 75 mg/m every 3 weeks started on 07/07/2018 and stopped in Feb 2020 after 6 cycles.  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 -currently on Eligard 30 mg every 4 months and Zytiga 1000 mg daily started in April 2020.  His PSA has been undetectable since then

## 2022-10-19 ENCOUNTER — Other Ambulatory Visit: Payer: Self-pay | Admitting: Adult Health

## 2022-10-19 ENCOUNTER — Other Ambulatory Visit: Payer: Self-pay

## 2022-10-19 LAB — PROSTATE-SPECIFIC AG, SERUM (LABCORP): Prostate Specific Ag, Serum: <0.1 ng/mL (ref 0.0–4.0)

## 2022-10-19 MED ORDER — LISINOPRIL 2.5 MG PO TABS: 2.5000 mg | ORAL_TABLET | Freq: Every day | ORAL | 3 refills | Status: DC

## 2022-10-22 ENCOUNTER — Other Ambulatory Visit: Payer: Self-pay

## 2022-10-22 ENCOUNTER — Other Ambulatory Visit (HOSPITAL_COMMUNITY): Payer: Self-pay

## 2022-10-22 DIAGNOSIS — C61 Malignant neoplasm of prostate: Secondary | ICD-10-CM

## 2022-10-22 MED ORDER — ABIRATERONE ACETATE 250 MG PO TABS
1000.0000 mg | ORAL_TABLET | Freq: Every day | ORAL | 2 refills | Status: DC
  Filled 2022-10-22 – 2022-10-27 (×2): qty 120, 30d supply, fill #0
  Filled 2022-11-25: qty 120, 30d supply, fill #1
  Filled 2022-12-28: qty 120, 30d supply, fill #2

## 2022-10-27 ENCOUNTER — Other Ambulatory Visit: Payer: Self-pay

## 2022-10-28 ENCOUNTER — Other Ambulatory Visit: Payer: Self-pay

## 2022-10-28 ENCOUNTER — Other Ambulatory Visit (HOSPITAL_COMMUNITY): Payer: Self-pay

## 2022-10-28 ENCOUNTER — Telehealth: Payer: Self-pay

## 2022-10-28 NOTE — Progress Notes (Cosign Needed Addendum)
Care Management & Coordination Services Pharmacy Team  Reason for Encounter: Hypertension and Diabetes  Contacted patient to discuss hypertension and diabetes disease state. Spoke with patient on 11/01/2022   Current antihyperglycemic regimen:  Glipzide 10 mg twice daily Metformin 1000 mg twice daily Current antihypertensive regimen:  Lisinopril 2.5 mg daily Labetalol 200 mg 3 tablets in the AM and 2 tablets in the PM Spironolactone 25 mg 1 tablet every other day Torsemide 20 mg daily Patient verbally confirms he is taking the above medications as directed. Yes  How often are you checking your Blood Pressure? Patient states he is checking daily in the morning  he checks his blood pressure in the morning before taking his medication.  Current home BP readings:  DATE:             BP                11/01/2022 131/72 10/31/2022 103/63 10/30/2022 128/72  Any readings above 180/100? Patient denies  What recent interventions/DTPs have been made by any provider to improve Blood Pressure control since last CPP Visit:  START lisinopril 2.'5mg'$  qd with PCP approval for kidney protection  Check BP/HR/BG daily and keep a log   Any recent hospitalizations or ED visits since last visit with CPP? No recent hospital visits noted  What diet changes have been made to improve Blood Pressure and Blood Sugar Control?  Patient follows no specific diet Breakfast - patient will have eggs and grits Lunch - patient will have a burger or sandwich Dinner - patient will have a meat with vegetables Caffeine intake: coffee in the AM and sugar free coke Salt intake: patient doesn't add salt  What exercise is being done to improve your Blood Pressure Control?  Patient does do his own house work and yard work and he has stairs in his house, he climbs them 4-5 times per day.   Patient reports hypoglycemic symptoms, including Sweaty and Hungry  Patient denies hyperglycemic symptoms, including none  How  often are you checking your blood sugar? twice daily  What are your blood sugars ranging?  Fasting: 11/01/22 - 98          10/31/22 - 97                     10/30/22 - 100   After meals: 10/31/22 - 240                10/31/22 - 235                           During the week, how often does your blood glucose drop below 70?  Patient states last week his blood sugar dropped under 70, he states this almost never happens. He was able to eat something and his blood sugars returned to normal quickly and symptoms of feeling sweaty and  [place] subsided quickly.   Are you checking your feet daily/regularly? Yes  Adherence Review: Is the patient currently on a STATIN medication? Yes Is the patient currently on ACE/ARB medication? Yes Does the patient have >5 day gap between last estimated fill dates? No  Care Gaps: AWV - scheduled 11/08/2022 Next appt - 01/03/2023 Last eye exam / retinopathy screening: 09/25/2013 Last diabetic foot exam: 01/22/2021 HGA1C - overdue, last done 02/26/2022 6.1 Tdap - overdue Urine ACR - overdue Covid - overdue  Star Rating Drugs: Lisinopril 2.5 mg - last filled 10/19/2022 90  DS at [facility] Atorvastatin 80 mg - last filled 09/28/2022 90 DS at [facility] Glipizide 10 mg - last filled 08/27/2022 90 DS at [facility] Metformin 1000 mg - last filled 07/08/2022 90 DS at [facility], patient states his previous pharmacy had been sending him too much metformin so he still has plenty on hand.   Chart Updates: Recent office visits:  None  Recent consult visits:  10/18/2022 Truitt Merle MD (oncology) - Patient was seen for Malignant neoplasm of prostate. No medication changes.   Hospital visits:  None  Medications: Outpatient Encounter Medications as of 10/28/2022  Medication Sig   lisinopril (ZESTRIL) 2.5 MG tablet Take 1 tablet (2.5 mg total) by mouth daily.   abiraterone acetate (ZYTIGA) 250 MG tablet Take 4 tablets (1,000 mg total) by mouth daily. Take on an empty stomach 1  hour before or 2 hours after a meal   aspirin EC 81 MG tablet Take 81 mg by mouth daily. Swallow whole.   atorvastatin (LIPITOR) 80 MG tablet TAKE 1 TABLET EVERY DAY   Calcium Carbonate (CALCIUM 600 PO) Take 1,200 mg by mouth daily.   cholecalciferol (VITAMIN D3) 25 MCG (1000 UT) tablet Take 1,000 Units by mouth daily.   ferrous sulfate 325 (65 FE) MG EC tablet Take 1 tablet (325 mg total) by mouth 2 (two) times daily before a meal.   glipiZIDE (GLUCOTROL XL) 10 MG 24 hr tablet TAKE 1 TABLET TWICE DAILY   labetalol (NORMODYNE) 200 MG tablet TAKE 3 TABLETS EVERY MORNING AND TAKE 2 TABLETS EVERY EVENING   levothyroxine (SYNTHROID) 125 MCG tablet TAKE 1 TABLET EVERY DAY   metFORMIN (GLUCOPHAGE) 1000 MG tablet TAKE 1 TABLET TWICE DAILY WITH MEALS   omeprazole (PRILOSEC) 40 MG capsule Take 1 capsule (40 mg total) by mouth daily.   potassium chloride SA (KLOR-CON M) 20 MEQ tablet Take 1 tablet (20 mEq total) by mouth daily.   predniSONE (DELTASONE) 5 MG tablet Take 1 tablet (5 mg total) by mouth daily with breakfast.   spironolactone (ALDACTONE) 25 MG tablet Take 1 tablet (25 mg total) by mouth every other day.   torsemide (DEMADEX) 20 MG tablet Take 1 tablet (20 mg total) by mouth daily.   No facility-administered encounter medications on file as of 10/28/2022.  Fill History:   Dispensed Days Supply Quantity Provider Pharmacy  abiraterone acetate (ZYTIGA) 250 MG tablet 10/01/2022 30 120 tablet      Dispensed Days Supply Quantity Provider Pharmacy  atorvastatin 80 mg tablet 09/28/2022 90 90 tablet      Dispensed Days Supply Quantity Provider Pharmacy  glipizide ER 10 mg tablet, extended release 24 hr 08/27/2022 90 180 tablet      Dispensed Days Supply Quantity Provider Pharmacy  labetalol 200 mg tablet 09/28/2022 90 450 tablet      Dispensed Days Supply Quantity Provider Pharmacy  levothyroxine 125 mcg tablet 08/04/2022 90 90 tablet      Dispensed Days Supply Quantity Provider Pharmacy   lisinopril 2.5 mg tablet 10/19/2022 90 90 tablet      Dispensed Days Supply Quantity Provider Pharmacy  metformin 1,000 mg tablet 07/08/2022 90 180 tablet      Dispensed Days Supply Quantity Provider Pharmacy  omeprazole 40 mg capsule,delayed release 10/20/2022 90 90 capsule      Dispensed Days Supply Quantity Provider Pharmacy  potassium chloride ER 20 mEq tablet,extended release(part/cryst) 07/16/2022 90 90 tablet      Dispensed Days Supply Quantity Provider Pharmacy  predniSONE (DELTASONE) 5 MG tablet 08/02/2022 90  90 tablet      Dispensed Days Supply Quantity Provider Pharmacy  spironolactone 25 mg tablet 09/28/2022 90 45 tablet      Dispensed Days Supply Quantity Provider Pharmacy  torsemide 20 mg tablet 09/28/2022 90 90 tablet     Recent Office Vitals: BP Readings from Last 3 Encounters:  10/18/22 (!) 141/72  09/28/22 128/82  08/20/22 (!) 140/81   Pulse Readings from Last 3 Encounters:  10/18/22 73  09/28/22 72  09/06/22 64    Wt Readings from Last 3 Encounters:  10/18/22 194 lb 12.8 oz (88.4 kg)  09/28/22 203 lb (92.1 kg)  08/20/22 199 lb 3.2 oz (90.4 kg)     Kidney Function Lab Results  Component Value Date/Time   CREATININE 1.28 (H) 10/18/2022 02:20 PM   CREATININE 1.16 08/20/2022 09:31 AM   CREATININE 1.08 05/17/2022 09:18 AM   CREATININE 1.13 07/29/2020 08:31 AM   GFR 58.37 (L) 03/17/2022 07:34 AM   GFRNONAA 58 (L) 10/18/2022 02:20 PM   GFRAA 99 07/03/2020 10:16 AM   GFRAA >60 04/29/2020 09:48 AM       Latest Ref Rng & Units 10/18/2022    2:20 PM 08/20/2022    9:31 AM 05/17/2022    9:18 AM  BMP  Glucose 70 - 99 mg/dL 102  169  94   BUN 8 - 23 mg/dL 32  28  20   Creatinine 0.61 - 1.24 mg/dL 1.28  1.16  1.08   BUN/Creat Ratio 6 - 22 (calc)   SEE NOTE:   Sodium 135 - 145 mmol/L 142  140  144   Potassium 3.5 - 5.1 mmol/L 4.1  3.8  4.8   Chloride 98 - 111 mmol/L 104  103  108   CO2 22 - 32 mmol/L '25  27  28   '$ Calcium 8.9 - 10.3 mg/dL 9.3  9.0  8.9     Recent Relevant Labs: Lab Results  Component Value Date/Time   HGBA1C 6.1 (H) 02/26/2022 06:16 PM   HGBA1C 6.2 07/30/2021 07:36 AM   HGBA1C 6.3 02/22/2020 09:29 AM   HGBA1C 6.2 10/23/2019 10:15 AM   MICROALBUR <0.7 04/28/2016 07:56 AM   MICROALBUR 0.8 11/02/2013 08:54 AM    Kidney Function Lab Results  Component Value Date/Time   CREATININE 1.28 (H) 10/18/2022 02:20 PM   CREATININE 1.16 08/20/2022 09:31 AM   CREATININE 1.08 05/17/2022 09:18 AM   CREATININE 1.13 07/29/2020 08:31 AM   GFR 58.37 (L) 03/17/2022 07:34 AM   GFRNONAA 58 (L) 10/18/2022 02:20 PM   GFRAA 99 07/03/2020 10:16 AM   GFRAA >60 04/29/2020 09:48 AM   [place]  Clinical Pharmacist Assistant [phone]

## 2022-11-01 ENCOUNTER — Other Ambulatory Visit: Payer: Self-pay

## 2022-11-03 ENCOUNTER — Telehealth: Payer: Self-pay | Admitting: Adult Health

## 2022-11-03 NOTE — Telephone Encounter (Signed)
[place]. to schedule their annual wellness visit. Appointment made for 11/08/22.  [facility] AW[facility] direct phone # [phone]  Spoke with patient  schedule change  moved appt from 230 to 1pm for phone appt Patient aware of time change

## 2022-11-08 ENCOUNTER — Ambulatory Visit: Payer: Medicare PPO

## 2022-11-08 ENCOUNTER — Telehealth: Payer: Self-pay | Admitting: Adult Health

## 2022-11-08 NOTE — Telephone Encounter (Signed)
[place]. to schedule their annual wellness visit. Appointment made for 11/10/22.  [facility] [employer] direct phone # [phone]

## 2022-11-09 ENCOUNTER — Ambulatory Visit: Payer: Medicare PPO | Admitting: Internal Medicine

## 2022-11-10 ENCOUNTER — Ambulatory Visit (INDEPENDENT_AMBULATORY_CARE_PROVIDER_SITE_OTHER): Payer: Medicare PPO

## 2022-11-10 VITALS — Ht 68.0 in | Wt 194.0 lb

## 2022-11-10 DIAGNOSIS — Z0000 Encounter for general adult medical examination without abnormal findings: Secondary | ICD-10-CM

## 2022-11-10 NOTE — Progress Notes (Signed)
Subjective:   Matthew Serrano. is a 77 y.o. male who presents for Medicare Annual/Subsequent preventive examination.  Review of Systems    Virtual Visit via Telephone Note  I connected with  [name]. on 11/10/22 at  9:45 AM EDT by telephone and verified that I am speaking with the correct person using two identifiers.  Location: Patient: Home Provider: Office Persons participating in the virtual visit: patient/Nurse Health Advisor   I discussed the limitations, risks, security and privacy concerns of performing an evaluation and management service by telephone and the availability of in person appointments. The patient expressed understanding and agreed to proceed.  Interactive audio and video telecommunications were attempted between this nurse and patient, however failed, due to patient having technical difficulties OR patient did not have access to video capability.  We continued and completed visit with audio only.  Some vital signs may be absent or patient reported.   Criselda Peaches, LPN  Cardiac Risk Factors include: advanced age (>29men, >37 women);hypertension;male gender     Objective:    Today's Vitals   11/10/22 0945  Weight: 194 lb (88 kg)  Height: 5\' 8"  (1.727 m)   Body mass index is 29.5 kg/m.     11/10/2022    9:57 AM 03/03/2022    8:53 AM 11/02/2021    2:35 PM 11/12/2020    9:14 AM 05/13/2020    1:51 PM 05/13/2020   11:15 AM 05/10/2020   10:18 PM  Advanced Directives  Does Patient Have a Medical Advance Directive? Yes No Yes Yes No No No  Type of Paramedic of [place];Living will  Out of facility DNR (pink MOST or yellow form) [place];Living will     Does patient want to make changes to medical advance directive? No - Patient declined   No - Patient declined     Copy of [place] in Chart? Yes - validated most recent copy scanned in chart (See row information)   No - copy  requested     Would patient like information on creating a medical advance directive?     No - Patient declined      Current Medications (verified) Outpatient Encounter Medications as of 11/10/2022  Medication Sig   lisinopril (ZESTRIL) 2.5 MG tablet Take 1 tablet (2.5 mg total) by mouth daily.   abiraterone acetate (ZYTIGA) 250 MG tablet Take 4 tablets (1,000 mg total) by mouth daily. Take on an empty stomach 1 hour before or 2 hours after a meal   aspirin EC 81 MG tablet Take 81 mg by mouth daily. Swallow whole.   atorvastatin (LIPITOR) 80 MG tablet TAKE 1 TABLET EVERY DAY   Calcium Carbonate (CALCIUM 600 PO) Take 1,200 mg by mouth daily.   cholecalciferol (VITAMIN D3) 25 MCG (1000 UT) tablet Take 1,000 Units by mouth daily.   ferrous sulfate 325 (65 FE) MG EC tablet Take 1 tablet (325 mg total) by mouth 2 (two) times daily before a meal.   glipiZIDE (GLUCOTROL XL) 10 MG 24 hr tablet TAKE 1 TABLET TWICE DAILY   labetalol (NORMODYNE) 200 MG tablet TAKE 3 TABLETS EVERY MORNING AND TAKE 2 TABLETS EVERY EVENING   levothyroxine (SYNTHROID) 125 MCG tablet TAKE 1 TABLET EVERY DAY   metFORMIN (GLUCOPHAGE) 1000 MG tablet TAKE 1 TABLET TWICE DAILY WITH MEALS   omeprazole (PRILOSEC) 40 MG capsule Take 1 capsule (40 mg total) by mouth daily.   potassium  chloride SA (KLOR-CON M) 20 MEQ tablet Take 1 tablet (20 mEq total) by mouth daily.   predniSONE (DELTASONE) 5 MG tablet Take 1 tablet (5 mg total) by mouth daily with breakfast.   spironolactone (ALDACTONE) 25 MG tablet Take 1 tablet (25 mg total) by mouth every other day.   torsemide (DEMADEX) 20 MG tablet Take 1 tablet (20 mg total) by mouth daily.   No facility-administered encounter medications on file as of 11/10/2022.    Allergies (verified) Patient has no known allergies.   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   23 mm human aortic root graft with 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 and reimplantation of left main and right coronary arteries   Severe aortic insufficiency 05/13/2020   acute onset in setting of bacterial endocarditis   Severe aortic stenosis    Past Surgical History:  Procedure Laterality Date   AORTIC VALVE REPLACEMENT N/A 05/15/2020   Procedure: AORTIC VALVE REPLACEMENT (AVR) SIZE 23 MM,  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;  Surgeon: Rexene Alberts, MD;  Location: [facility];  Service: Open Heart Surgery;  Laterality: N/A;   ASCENDING AORTIC ROOT REPLACEMENT N/A 05/15/2020   Procedure: HUMAN ALLOGRAFT AORTIC ROOT REPLACEMENT;  Surgeon: Rexene Alberts, MD;  Location: [facility];  Service: Open Heart Surgery;  Laterality: N/A;   BUBBLE STUDY  03/03/2022   Procedure: BUBBLE STUDY;  Surgeon: [name], MD;  Location: [facility];  Service: Cardiovascular;;   COLONOSCOPY     COLONOSCOPY N/A 05/03/2020   Procedure: COLONOSCOPY;  Surgeon: Juanita Craver, MD;  Location: [facility];  Service: Endoscopy;  Laterality: N/A;   COLONOSCOPY W/ BIOPSIES AND POLYPECTOMY     ENTEROSCOPY N/A 05/02/2020   Procedure: ENTEROSCOPY;  Surgeon: Rush Landmark Telford Nab., MD;  Location: [facility];  Service: Gastroenterology;  Laterality: N/A;   HEMOSTASIS CLIP PLACEMENT  05/03/2020   Procedure: HEMOSTASIS CLIP PLACEMENT;  Surgeon: Juanita Craver, MD;  Location: [facility];  Service: Endoscopy;;   HOT HEMOSTASIS N/A  05/03/2020   Procedure: HOT HEMOSTASIS (ARGON PLASMA COAGULATION/BICAP);  Surgeon: Juanita Craver, MD;  Location: [facility];  Service: Endoscopy;  Laterality: N/A;   IR IMAGING GUIDED PORT INSERTION  07/05/2018   POLYPECTOMY     PORT-A-CATH REMOVAL Right 05/20/2020   Procedure: REMOVAL PORT-A-CATH;  Surgeon: Rexene Alberts, MD;  Location: [facility] OR;  Service: Thoracic;  Laterality: Right;   RIGHT/LEFT HEART CATH AND CORONARY ANGIOGRAPHY N/A 02/13/2020   Procedure: RIGHT/LEFT HEART CATH AND CORONARY ANGIOGRAPHY;  Surgeon: Belva Crome, MD;  Location: [facility];  Service: Cardiovascular;  Laterality: N/A;   SUBMUCOSAL INJECTION  05/02/2020   Procedure: SUBMUCOSAL INJECTION;  Surgeon: Irving Copas., MD;  Location: [facility];  Service: Gastroenterology;;   TEE WITHOUT CARDIOVERSION N/A 05/14/2020   Procedure: TRANSESOPHAGEAL  ECHOCARDIOGRAM (TEE);  Surgeon: Sanda Klein, MD;  Location: [facility];  Service: Cardiovascular;  Laterality: N/A;   TEE WITHOUT CARDIOVERSION N/A 05/15/2020   Procedure: TRANSESOPHAGEAL ECHOCARDIOGRAM (TEE);  Surgeon: Rexene Alberts, MD;  Location: [facility];  Service: Open Heart Surgery;  Laterality: N/A;   TEE WITHOUT CARDIOVERSION N/A 03/03/2022   Procedure: TRANSESOPHAGEAL ECHOCARDIOGRAM (TEE);  Surgeon: [name], MD;  Location: [facility];  Service: Cardiovascular;  Laterality: N/A;   Family History  Problem Relation Age of Onset   Aneurysm Mother        Brain aneurysm    Breast cancer Sister    Prostate cancer Brother    Hypertension Other    Breast cancer Other        family history   Colon cancer Neg Hx    Esophageal cancer Neg Hx    Stomach cancer Neg Hx    Rectal cancer Neg Hx    Social History   Socioeconomic History   Marital status: Widowed    Spouse name: Not on file   Number of children: 0   Years of education: Not on file   Highest education level: Not on file  Occupational History   Not on file  Tobacco Use    Smoking status: Former   Smokeless tobacco: Never   Tobacco comments:    quit 2005  Vaping Use   Vaping Use: Never used  Substance and Sexual Activity   Alcohol use: Not Currently    Alcohol/week: 14.0 standard drinks of alcohol    Types: 14 Cans of beer per week    Comment: stopped in 07/2022   Drug use: No   Sexual activity: Not on file  Other Topics Concern   Not on file  Social History Narrative   He works as a Freight forwarder    Not married    No kids          Social Determinants of Health   Financial Resource Strain: Low Risk  (11/10/2022)   Overall Financial Resource Strain (CARDIA)    Difficulty of Paying Living Expenses: Not hard at all  Food Insecurity: No Food Insecurity (11/10/2022)   Hunger Vital Sign    Worried About Running Out of Food in the Last Year: Never true    [place] in the Last Year: Never true  Transportation Needs: No Transportation Needs (11/10/2022)   PRAPARE - Hydrologist (Medical): No    Lack of Transportation (Non-Medical): No  Physical Activity: Inactive (11/10/2022)   Exercise Vital Sign    Days of Exercise per Week: 0 days    Minutes of Exercise per Session: 0 min  Stress: No Stress Concern Present (11/10/2022)   [place]    Feeling of Stress : Not at all  Social Connections: Socially Isolated (11/10/2022)   Social Connection and Isolation Panel [NHANES]    Frequency of Communication with Friends and Family: More than three times a week    Frequency of Social Gatherings with Friends and Family: More than three times a week    Attends Religious Services: Never    Marine scientist or Organizations: No    Attends Archivist Meetings: Never    Marital Status: Widowed    Tobacco Counseling Counseling given: Not Answered Tobacco comments: quit 2005   Clinical Intake:  Pre-visit preparation completed: No  Pain :  No/denies pain  BMI - recorded: 29.5 Nutritional Status: BMI 25 -29 Overweight Nutritional Risks: None Diabetes: No  How often do you need to have someone help you when you read instructions, pamphlets, or other written materials from your doctor or pharmacy?: 1 - Never  Diabetic? Yes  Interpreter Needed?: NoNutrition Risk Assessment:  Has the patient had any N/V/D within the last 2 months?  No  Does the patient have any non-healing wounds?  No  Has the patient had any unintentional weight loss or weight gain?  No   Diabetes:  Is the patient diabetic?  Yes  If diabetic, was a CBG obtained today?  No  Did the patient bring in their glucometer from home?  No  How often do you monitor your CBG's? 3 x weekly.   Financial Strains and Diabetes Management:  Are you having any financial strains with the device, your supplies or your medication? No .  Does the patient want to be seen by Chronic Care Management for management of their diabetes?  No  Would the patient like to be referred to a Nutritionist or for Diabetic Management?  No   Diabetic Exams:  Diabetic Eye Exam: Completed Yes. Overdue for diabetic eye exam. Pt has been advised about the importance in completing this exam. A referral has been placed today. Message sent to referral coordinator for scheduling purposes. Advised pt to expect a call from office referred to regarding appt.  Diabetic Foot Exam: Completed Yes. Pt has been advised about the importance in completing this exam. Pt is scheduled for diabetic foot exam on Followed by PCP.    Information entered by :: Rolene Arbour LPN   Activities of Daily Living    11/10/2022    9:55 AM  In your present state of health, do you have any difficulty performing the following activities:  Hearing? 0  Vision? 0  Difficulty concentrating or making decisions? 0  Walking or climbing stairs? 0  Dressing or bathing? 0  Doing errands, shopping? 0  Preparing Food and  eating ? N  Using the Toilet? N  In the past six months, have you accidently leaked urine? N  Do you have problems with loss of bowel control? N  Managing your Medications? N  Managing your Finances? N  Housekeeping or managing your Housekeeping? N    Patient Care Team: [name], NP as PCP - General (Family Medicine) [name], MD as PCP - Cardiology (Cardiology) [name], MD as Attending Physician (Urology) Sharmon Revere as Physician Assistant (Cardiology) Truitt Merle, MD as Attending Physician (Hematology and Oncology) Maren Reamer, [facility] (Pharmacist)  Indicate any recent Medical Services you may have received from other than [facility] providers in the past year (date may be approximate).     Assessment:   This is a routine wellness examination for [place].  Hearing/Vision screen Hearing Screening - Comments:: Denies hearing difficulties   Vision Screening - Comments::  Pending appt Dr Delman Cheadle  Dietary issues and exercise activities discussed: Current Exercise Habits: The patient does not participate in regular exercise at present, Exercise limited by: None identified   Goals Addressed               This Visit's Progress     Increase physical activity (pt-stated)        Cut back on drinking alcohol.       Depression Screen    11/10/2022    9:53 AM 07/30/2022    9:45 AM 05/17/2022  9:04 AM 04/06/2022    9:41 AM 11/02/2021    2:37 PM 07/30/2021    7:33 AM 11/12/2020    9:16 AM  PHQ 2/9 Scores  PHQ - 2 Score 0 0 0 0 0 1 0    Fall Risk    11/10/2022    9:55 AM 05/17/2022    9:03 AM 04/06/2022    9:41 AM 11/02/2021    2:36 PM 07/30/2021    7:33 AM  Fall Risk   Falls in the past year? 0 0 0 0 0  Number falls in past yr: 0 0 0    Injury with Fall? 0 0 0    Risk for fall due to : No Fall Risks No Fall Risks No Fall Risks Medication side effect   Follow up Falls prevention discussed Falls evaluation completed Falls evaluation completed  Falls evaluation completed;Education provided;Falls prevention discussed     FALL RISK PREVENTION PERTAINING TO THE HOME:  Any stairs in or around the home? Yes  If so, are there any without handrails? No  Home free of loose throw rugs in walkways, pet beds, electrical cords, etc? Yes  Adequate lighting in your home to reduce risk of falls? Yes   ASSISTIVE DEVICES UTILIZED TO PREVENT FALLS:  Life alert? No  Use of a cane, walker or w/c? No  Grab bars in the bathroom? Yes  Shower chair or bench in shower? No  Elevated toilet seat or a handicapped toilet? No   TIMED UP AND GO:  Was the test performed? No . Audio Visit   Cognitive Function:        11/10/2022    9:57 AM 11/02/2021    2:38 PM  6CIT Screen  What Year? 0 points 0 points  What month? 0 points 0 points  What time? 0 points 0 points  Count back from 20 0 points 0 points  Months in reverse 0 points 0 points  Repeat phrase 2 points 2 points  Total Score 2 points 2 points    Immunizations Immunization History  Administered Date(s) Administered   Fluad Quad(high Dose 65+) 07/26/2019, 07/29/2020, 07/30/2021, 05/20/2022   Influenza Split 07/12/2011   Influenza Whole 05/23/2008, 06/13/2009, 05/11/2010   Influenza, High Dose Seasonal PF 05/03/2016, 05/24/2018   Influenza,inj,Quad PF,6+ Mos 05/16/2014   PFIZER(Purple Top)SARS-COV-2 Vaccination 10/21/2019, 11/20/2019, 07/25/2020   Pneumococcal Conjugate-13 11/13/2013   Pneumococcal Polysaccharide-23 06/02/2000, 12/24/2004, 05/03/2016   Td 01/05/1996, 01/12/2007   Zoster Recombinat (Shingrix) 01/06/2022, 04/08/2022    TDAP status: Due, Education has been provided regarding the importance of this vaccine. Advised may receive this vaccine at local pharmacy or Health Dept. Aware to provide a copy of the vaccination record if obtained from local pharmacy or Health Dept. Verbalized acceptance and understanding.  Flu Vaccine status: Up to date  Pneumococcal vaccine  status: Up to date  Covid-19 vaccine status: Completed vaccines  Qualifies for Shingles Vaccine? Yes   Zostavax completed Yes   Shingrix Completed?: Yes  Screening Tests Health Maintenance  Topic Date Due   DTaP/Tdap/Td (3 - Tdap) 01/11/2017   HEMOGLOBIN A1C  08/29/2022   OPHTHALMOLOGY EXAM  11/10/2022 (Originally 09/25/2014)   Diabetic kidney evaluation - Urine ACR  11/11/2022 (Originally 04/28/2017)   FOOT EXAM  11/11/2022 (Originally 01/22/2022)   COVID-19 Vaccine (4 - 2023-24 season) 11/26/2022 (Originally 04/23/2022)   COLONOSCOPY (Pts 45-33yrs Insurance coverage will need to be confirmed)  05/04/2023   Diabetic kidney evaluation - eGFR measurement  10/19/2023   Medicare  Annual Wellness (AWV)  11/10/2023   Pneumonia Vaccine 76+ Years old  Completed   INFLUENZA VACCINE  Completed   Hepatitis C Screening  Completed   Zoster Vaccines- Shingrix  Completed   HPV VACCINES  Aged Out    Health Maintenance  Health Maintenance Due  Topic Date Due   DTaP/Tdap/Td (3 - Tdap) 01/11/2017   HEMOGLOBIN A1C  08/29/2022    Colorectal cancer screening: Type of screening: Colonoscopy. Completed 05/03/20. Repeat every 3 years  Lung Cancer Screening: (Low Dose CT Chest recommended if Age 58-80 years, 30 pack-year currently smoking OR have quit w/in 15years.) does not qualify.     Additional Screening:  Hepatitis C Screening: does qualify; Completed 07/25/21  Vision Screening: Recommended annual ophthalmology exams for early detection of glaucoma and other disorders of the eye. Is the patient up to date with their annual eye exam?  Yes  Who is the provider or what is the name of the office in which the patient attends annual eye exams? Dr Delman Cheadle If pt is not established with a provider, would they like to be referred to a provider to establish care? No .   Dental Screening: Recommended annual dental exams for proper oral hygiene  Community Resource Referral / Chronic Care Management:   CRR  required this visit?  No   CCM required this visit?  No      Plan:     I have personally reviewed and noted the following in the patient's chart:   Medical and social history Use of alcohol, tobacco or illicit drugs  Current medications and supplements including opioid prescriptions. Patient is not currently taking opioid prescriptions. Functional ability and status Nutritional status Physical activity Advanced directives List of other physicians Hospitalizations, surgeries, and ER visits in previous 12 months Vitals Screenings to include cognitive, depression, and falls Referrals and appointments  In addition, I have reviewed and discussed with patient certain preventive protocols, quality metrics, and best practice recommendations. A written personalized care plan for preventive services as well as general preventive health recommendations were provided to patient.     Criselda Peaches, LPN   [id number]   Nurse Notes: Patient due Hemoglobin A1C and Diabetic kidney evaluation-Urine ACR

## 2022-11-10 NOTE — Patient Instructions (Addendum)
Matthew Serrano , Thank you for taking time to come for your Medicare Wellness Visit. I appreciate your ongoing commitment to your health goals. Please review the following plan we discussed and let me know if I can assist you in the future.   These are the goals we discussed:  Goals       Increase physical activity (pt-stated)      Cut back on drinking alcohol.      Manage My Medicine      Timeframe:  Short-Term Goal Priority:  High Start Date:                             Expected End Date:                       Follow Up Date 05/07/21    - call for medicine refill 2 or 3 days before it runs out - keep a list of all the medicines I take; vitamins and herbals too - use a pillbox to sort medicine    Why is this important?   These steps will help you keep on track with your medicines.   Notes:       Monitor and Manage My Blood Sugar      Follow Up Date 10/12/20   - check blood sugar at prescribed times - check blood sugar before and after exercise - check blood sugar if I feel it is too high or too low - enter blood sugar readings and medication or insulin into daily log - take the blood sugar log to all doctor visits - take the blood sugar meter to all doctor visits    Why is this important?   Checking your blood sugar at home helps to keep it from getting very high or very low.  Writing the results in a diary or log helps the doctor know how to care for you.  Your blood sugar log should have the time, date and the results.  Also, write down the amount of insulin or other medicine that you take.  Other information, like what you ate, exercise done and how you were feeling, will also be helpful.     Notes: Glucose levels almost always <120 fasting. Has [facility] disease mgmt calendar to record gluocse levels and wts. Has new educational tool, All About Carbs to review and start using to improve his dietary intake and reduce carbs. 08/05/20 Matthew Serrano' FBS levels are ranging from 76-109. His  HgbA1C was 5.6 this month! He denies any hypoglycemic sxs. He does take his record to his MD. 09/09/20 FBS today was 113, no hypoglycemic episodes.      Obtain Eye Exam      Follow Up Date 10/12/20  - keep appointment with eye doctor - schedule appointment with eye doctor    Why is this important?   Eye check-ups are important when you have diabetes.  Vision loss can be prevented.    Notes: Assess if has had his eye exam in last year. 08/05/20 Pt has not had an diabetic eye exam. Discussed and pt agree to call his MD and schedule an appt before we talk again in one month. 09/09/20 Has not made eye appt yet, although he did go to the dentist. He had his teeth pulled and received dentures. Encouraged to make this appt before we talk again.      Patient Stated  76/13/2023, continue to walk daily and lose 10 pounds      Record weight daily      Follow up: 10/12/20  Pt to weigh daily and record.  Notes: 08/05/20. Pt is weighing daily. Wt varies between 182-185. Encouraged continuation and to log wts into his [facility] Calendar. Review HF ACTION PLAN. 09/09/20 Pt wt is stable at 185. No SOB or edema.      Weight (lb) < 175 lb (79.4 kg)      Follow up 10/12/20 Will learn carb counting to help lose wt and improve DM management. 08/05/20 Pt has received the All About Carbs information NP sent. He has not really studied it to date. He plans on reviewing this with them. His priority task with this packet of information is to complete his HCPOA and Living Will. Encouraged to pull the carb counting info out of packet and start looking at it to learn what foods are considered carbs for our next conversation in January. 09/09/20 Wt is stable at 185. He has the educational material I sent but has not really looked at it. Encouraged him to study a few pages a day and add to this education with a few pages each day. He can manage both his wt and DM with following these simple guidelines.         This is a list of  the screening recommended for you and due dates:  Health Maintenance  Topic Date Due   DTaP/Tdap/Td vaccine (3 - Tdap) 01/11/2017   Hemoglobin A1C  08/29/2022   Eye exam for diabetics  11/10/2022*   Yearly kidney health urinalysis for diabetes  11/11/2022*   Complete foot exam   11/11/2022*   COVID-19 Vaccine (4 - 2023-24 season) 11/26/2022*   Colon Cancer Screening  05/04/2023   Yearly kidney function blood test for diabetes  10/19/2023   Medicare Annual Wellness Visit  11/10/2023   Pneumonia Vaccine  Completed   Flu Shot  Completed   Hepatitis C Screening: USPSTF Recommendation to screen - Ages 18-79 yo.  Completed   Zoster (Shingles) Vaccine  Completed   HPV Vaccine  Aged Out  *Topic was postponed. The date shown is not the original due date.    Advanced directives: In Chart  Conditions/risks identified: None  Next appointment: Follow up in one year for your annual wellness visit.   Preventive Care 76 Years and Older, Male  Preventive care refers to lifestyle choices and visits with your health care provider that can promote health and wellness. What does preventive care include? A yearly physical exam. This is also called an annual well check. Dental exams once or twice a year. Routine eye exams. Ask your health care provider how often you should have your eyes checked. Personal lifestyle choices, including: Daily care of your teeth and gums. Regular physical activity. Eating a healthy diet. Avoiding tobacco and drug use. Limiting alcohol use. Practicing safe sex. Taking low doses of aspirin every day. Taking vitamin and mineral supplements as recommended by your health care provider. What happens during an annual well check? The services and screenings done by your health care provider during your annual well check will depend on your age, overall health, lifestyle risk factors, and family history of disease. Counseling  Your health care provider may ask you  questions about your: Alcohol use. Tobacco use. Drug use. Emotional well-being. Home and relationship well-being. Sexual activity. Eating habits. History of falls. Memory and ability to understand (cognition). Work and work  environment. Screening  You may have the following tests or measurements: Height, weight, and BMI. Blood pressure. Lipid and cholesterol levels. These may be checked every 5 years, or more frequently if you are over 51 years old. Skin check. Lung cancer screening. You may have this screening every year starting at age 63 if you have a 30-pack-year history of smoking and currently smoke or have quit within the past 15 years. Fecal occult blood test (FOBT) of the stool. You may have this test every year starting at age 67. Flexible sigmoidoscopy or colonoscopy. You may have a sigmoidoscopy every 5 years or a colonoscopy every 10 years starting at age 42. Prostate cancer screening. Recommendations will vary depending on your family history and other risks. Hepatitis C blood test. Hepatitis B blood test. Sexually transmitted disease (STD) testing. Diabetes screening. This is done by checking your blood sugar (glucose) after you have not eaten for a while (fasting). You may have this done every 1-3 years. Abdominal aortic aneurysm (AAA) screening. You may need this if you are a current or former smoker. Osteoporosis. You may be screened starting at age 56 if you are at high risk. Talk with your health care provider about your test results, treatment options, and if necessary, the need for more tests. Vaccines  Your health care provider may recommend certain vaccines, such as: Influenza vaccine. This is recommended every year. Tetanus, diphtheria, and acellular pertussis (Tdap, Td) vaccine. You may need a Td booster every 10 years. Zoster vaccine. You may need this after age 79. Pneumococcal 13-valent conjugate (PCV13) vaccine. One dose is recommended after age  41. Pneumococcal polysaccharide (PPSV23) vaccine. One dose is recommended after age 71. Talk to your health care provider about which screenings and vaccines you need and how often you need them. This information is not intended to replace advice given to you by your health care provider. Make sure you discuss any questions you have with your health care provider. Document Released: 09/05/2015 Document Revised: 04/28/2016 Document Reviewed: 06/10/2015 Elsevier Interactive Patient Education  2017 [place] Prevention in the Home Falls can cause injuries. They can happen to people of all ages. There are many things you can do to make your home safe and to help prevent falls. What can I do on the outside of my home? Regularly fix the edges of walkways and driveways and fix any cracks. Remove anything that might make you trip as you walk through a door, such as a raised step or threshold. Trim any bushes or trees on the path to your home. Use bright outdoor lighting. Clear any walking paths of anything that might make someone trip, such as rocks or tools. Regularly check to see if handrails are loose or broken. Make sure that both sides of any steps have handrails. Any raised decks and porches should have guardrails on the edges. Have any leaves, snow, or ice cleared regularly. Use sand or salt on walking paths during winter. Clean up any spills in your garage right away. This includes oil or grease spills. What can I do in the bathroom? Use night lights. Install grab bars by the toilet and in the tub and shower. Do not use towel bars as grab bars. Use non-skid mats or decals in the tub or shower. If you need to sit down in the shower, use a plastic, non-slip stool. Keep the floor dry. Clean up any water that spills on the floor as soon as it happens. Remove soap  buildup in the tub or shower regularly. Attach bath mats securely with double-sided non-slip rug tape. Do not have throw  rugs and other things on the floor that can make you trip. What can I do in the bedroom? Use night lights. Make sure that you have a light by your bed that is easy to reach. Do not use any sheets or blankets that are too big for your bed. They should not hang down onto the floor. Have a firm chair that has side arms. You can use this for support while you get dressed. Do not have throw rugs and other things on the floor that can make you trip. What can I do in the kitchen? Clean up any spills right away. Avoid walking on wet floors. Keep items that you use a lot in easy-to-reach places. If you need to reach something above you, use a strong step stool that has a grab bar. Keep electrical cords out of the way. Do not use floor polish or wax that makes floors slippery. If you must use wax, use non-skid floor wax. Do not have throw rugs and other things on the floor that can make you trip. What can I do with my stairs? Do not leave any items on the stairs. Make sure that there are handrails on both sides of the stairs and use them. Fix handrails that are broken or loose. Make sure that handrails are as long as the stairways. Check any carpeting to make sure that it is firmly attached to the stairs. Fix any carpet that is loose or worn. Avoid having throw rugs at the top or bottom of the stairs. If you do have throw rugs, attach them to the floor with carpet tape. Make sure that you have a light switch at the top of the stairs and the bottom of the stairs. If you do not have them, ask someone to add them for you. What else can I do to help prevent falls? Wear shoes that: Do not have high heels. Have rubber bottoms. Are comfortable and fit you well. Are closed at the toe. Do not wear sandals. If you use a stepladder: Make sure that it is fully opened. Do not climb a closed stepladder. Make sure that both sides of the stepladder are locked into place. Ask someone to hold it for you, if  possible. Clearly mark and make sure that you can see: Any grab bars or handrails. First and last steps. Where the edge of each step is. Use tools that help you move around (mobility aids) if they are needed. These include: Canes. Walkers. Scooters. Crutches. Turn on the lights when you go into a dark area. Replace any light bulbs as soon as they burn out. Set up your furniture so you have a clear path. Avoid moving your furniture around. If any of your floors are uneven, fix them. If there are any pets around you, be aware of where they are. Review your medicines with your doctor. Some medicines can make you feel dizzy. This can increase your chance of falling. Ask your doctor what other things that you can do to help prevent falls. This information is not intended to replace advice given to you by your health care provider. Make sure you discuss any questions you have with your health care provider. Document Released: 06/05/2009 Document Revised: 01/15/2016 Document Reviewed: 09/13/2014 Elsevier Interactive Patient Education  2017 [employer].

## 2022-11-19 ENCOUNTER — Other Ambulatory Visit: Payer: Self-pay | Admitting: Adult Health

## 2022-11-19 DIAGNOSIS — E119 Type 2 diabetes mellitus without complications: Secondary | ICD-10-CM

## 2022-11-25 ENCOUNTER — Other Ambulatory Visit (HOSPITAL_COMMUNITY): Payer: Self-pay

## 2022-11-30 ENCOUNTER — Other Ambulatory Visit (HOSPITAL_COMMUNITY): Payer: Self-pay

## 2022-12-16 ENCOUNTER — Encounter: Payer: Self-pay | Admitting: Hematology

## 2022-12-16 ENCOUNTER — Other Ambulatory Visit: Payer: Self-pay

## 2022-12-16 ENCOUNTER — Inpatient Hospital Stay: Payer: Medicare PPO

## 2022-12-16 ENCOUNTER — Inpatient Hospital Stay: Payer: Medicare PPO | Attending: Hematology

## 2022-12-16 ENCOUNTER — Inpatient Hospital Stay: Payer: Medicare PPO | Admitting: Hematology

## 2022-12-16 VITALS — BP 126/65 | HR 69 | Temp 99.2°F | Resp 18 | Ht 68.0 in | Wt 196.1 lb

## 2022-12-16 DIAGNOSIS — C61 Malignant neoplasm of prostate: Secondary | ICD-10-CM

## 2022-12-16 DIAGNOSIS — Z79818 Long term (current) use of other agents affecting estrogen receptors and estrogen levels: Secondary | ICD-10-CM | POA: Insufficient documentation

## 2022-12-16 DIAGNOSIS — M8580 Other specified disorders of bone density and structure, unspecified site: Secondary | ICD-10-CM | POA: Diagnosis not present

## 2022-12-16 LAB — CBC WITH DIFFERENTIAL (CANCER CENTER ONLY)
Abs Immature Granulocytes: 0.03 10*3/uL (ref 0.00–0.07)
Basophils Absolute: 0.1 10*3/uL (ref 0.0–0.1)
Basophils Relative: 1 %
Eosinophils Absolute: 0.1 10*3/uL (ref 0.0–0.5)
Eosinophils Relative: 1 %
HCT: 36.2 % — ABNORMAL LOW (ref 39.0–52.0)
Hemoglobin: 11.7 g/dL — ABNORMAL LOW (ref 13.0–17.0)
Immature Granulocytes: 0 %
Lymphocytes Relative: 19 %
Lymphs Abs: 1.3 10*3/uL (ref 0.7–4.0)
MCH: 29.2 pg (ref 26.0–34.0)
MCHC: 32.3 g/dL (ref 30.0–36.0)
MCV: 90.3 fL (ref 80.0–100.0)
Monocytes Absolute: 0.4 10*3/uL (ref 0.1–1.0)
Monocytes Relative: 6 %
Neutro Abs: 5 10*3/uL (ref 1.7–7.7)
Neutrophils Relative %: 73 %
Platelet Count: 171 10*3/uL (ref 150–400)
RBC: 4.01 MIL/uL — ABNORMAL LOW (ref 4.22–5.81)
RDW: 14.1 % (ref 11.5–15.5)
WBC Count: 6.9 10*3/uL (ref 4.0–10.5)
nRBC: 0 % (ref 0.0–0.2)

## 2022-12-16 LAB — CMP (CANCER CENTER ONLY)
ALT: 15 U/L (ref 0–44)
AST: 19 U/L (ref 15–41)
Albumin: 4 g/dL (ref 3.5–5.0)
Alkaline Phosphatase: 50 U/L (ref 38–126)
Anion gap: 11 (ref 5–15)
BUN: 26 mg/dL — ABNORMAL HIGH (ref 8–23)
CO2: 24 mmol/L (ref 22–32)
Calcium: 8.7 mg/dL — ABNORMAL LOW (ref 8.9–10.3)
Chloride: 106 mmol/L (ref 98–111)
Creatinine: 1.32 mg/dL — ABNORMAL HIGH (ref 0.61–1.24)
GFR, Estimated: 56 mL/min — ABNORMAL LOW (ref >60)
Glucose, Bld: 186 mg/dL — ABNORMAL HIGH (ref 70–99)
Potassium: 4 mmol/L (ref 3.5–5.1)
Sodium: 141 mmol/L (ref 135–145)
Total Bilirubin: 0.9 mg/dL (ref 0.3–1.2)
Total Protein: 6.2 g/dL — ABNORMAL LOW (ref 6.5–8.1)

## 2022-12-16 MED ORDER — LEUPROLIDE ACETATE (4 MONTH) 30 MG ~~LOC~~ KIT
30.0000 mg | PACK | Freq: Once | SUBCUTANEOUS | Status: AC
  Administered 2022-12-16: 30 mg via SUBCUTANEOUS
  Filled 2022-12-16: qty 30

## 2022-12-16 NOTE — Assessment & Plan Note (Signed)
-  stage IV with node metastasis, diagnosed in 2019, castration-resistant now  -Initial prostate biopsy obtained on 06/06/2018 which showed high-volume disease with a Gleason score 4+5 = 9. -first line therapy with Taxotere chemotherapy at 75 mg/m every 3 weeks started on 07/07/2018 and stopped in Feb 2020 after 6 cycles.  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 -currently on Eligard 30 mg every 4 months and Zytiga 1000 mg daily started in April 2020.  His PSA has been undetectable since then  -He is clinically doing very well, asymptomatic, will continue current treatment. -Will obtain a PSMA PET scan if his PSA increases significantly

## 2022-12-16 NOTE — Progress Notes (Signed)
[facility]   [phone] [phone]   Clinic Follow up Note   Patient Care Team: Shirline Frees, NP as PCP - General (Family Medicine) Tonny Bollman, MD as PCP - Cardiology (Cardiology) Crist Fat, MD as Attending Physician (Urology) Kennon Rounds as Physician Assistant (Cardiology) Malachy Mood, MD as Attending Physician (Hematology and Oncology) Sherrill Raring, [facility] (Pharmacist)  Date of Service:  12/16/2022  CHIEF COMPLAINT: f/u of Prostate Cancer   CURRENT THERAPY:   Eligard 30 mg every 4 months.   Zytiga 1000 mg daily and prednisone 5 mg daily started in April 2020.   ASSESSMENT:  Matthew Walkins. is a 77 y.o. male with   Malignant neoplasm of prostate (HCC) -stage IV with node metastasis, diagnosed in 2019, castration-resistant now  -Initial prostate biopsy obtained on 06/06/2018 which showed high-volume disease with a Gleason score 4+5 = 9. -first line therapy with Taxotere chemotherapy at 75 mg/m every 3 weeks started on 07/07/2018 and stopped in Feb 2020 after 6 cycles.  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 -currently on Eligard 30 mg every 4 months and Zytiga 1000 mg daily started in April 2020.  His PSA has been undetectable since then  -He is clinically doing very well, asymptomatic, will continue current treatment. -Will obtain a PSMA PET scan if his PSA increases significantly   Osteopenia -He was started on Prolia Inlyta 2023, however his insurance does not cover it. -Will continue calcium and vitamin D, and repeat bone density scan in 2025. -I also encouraged him to have weightbearing exercise.     PLAN: -lab reviewed -CMP- stable, Calcium little low -recommend calcium supplement -lab and f.u and  Eligard Injection in 4 months   SUMMARY OF ONCOLOGIC HISTORY: Oncology History  Malignant neoplasm of prostate  06/23/2018 Initial Diagnosis    Malignant neoplasm of prostate (HCC)   07/07/2018 - 10/20/2018 Chemotherapy   The patient had pegfilgrastim (NEULASTA ONPRO KIT) injection 6 mg, 6 mg, Subcutaneous, Once, 5 of 5 cycles Administration: 6 mg (07/28/2018), 6 mg (08/18/2018), 6 mg (09/08/2018), 6 mg (09/29/2018), 6 mg (10/20/2018) pegfilgrastim-cbqv (UDENYCA) injection 6 mg, 6 mg, Subcutaneous, Once, 1 of 1 cycle Administration: 6 mg (07/08/2018) DOCEtaxel (TAXOTERE) 170 mg in sodium chloride 0.9 % 250 mL chemo infusion, 75 mg/m2 = 170 mg, Intravenous,  Once, 6 of 6 cycles Administration: 170 mg (07/07/2018), 170 mg (07/28/2018), 170 mg (08/18/2018), 170 mg (09/08/2018), 170 mg (09/29/2018), 170 mg (10/20/2018)  for chemotherapy treatment.       INTERVAL HISTORY:  Matthew Rijo. is here for a follow up of Prostate Cancer. He was last seen by me on 10/18/2022. He presents to the clinic alone. Pt state that he is doing well. Pt has no problems with ZytIga. Pt state that his energy level is good.   All other systems were reviewed with the patient and are negative.  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   23 mm human aortic root graft with 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 and reimplantation of left main and right coronary arteries   Severe aortic insufficiency 05/13/2020   acute onset in setting of bacterial endocarditis   Severe  aortic stenosis     SURGICAL HISTORY: Past Surgical History:  Procedure Laterality Date   AORTIC VALVE REPLACEMENT N/A 05/15/2020   Procedure: AORTIC VALVE REPLACEMENT (AVR) SIZE 23 MM,  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;  Surgeon: Purcell Nails, MD;  Location: [facility] OR;  Service: Open Heart Surgery;  Laterality: N/A;   ASCENDING AORTIC ROOT REPLACEMENT N/A 05/15/2020   Procedure: HUMAN ALLOGRAFT AORTIC ROOT REPLACEMENT;  Surgeon: Purcell Nails, MD;  Location: [facility] OR;  Service: Open Heart Surgery;  Laterality: N/A;   BUBBLE STUDY  03/03/2022   Procedure: BUBBLE STUDY;  Surgeon: Meriam Sprague, MD;  Location: [facility] ENDOSCOPY;  Service: Cardiovascular;;   COLONOSCOPY     COLONOSCOPY N/A 05/03/2020   Procedure: COLONOSCOPY;  Surgeon: Charna Elizabeth, MD;  Location: [facility] ENDOSCOPY;  Service: Endoscopy;  Laterality: N/A;   COLONOSCOPY W/ BIOPSIES AND POLYPECTOMY     ENTEROSCOPY N/A 05/02/2020   Procedure: ENTEROSCOPY;  Surgeon: Meridee Score Netty Starring., MD;  Location: [facility] ENDOSCOPY;  Service: Gastroenterology;  Laterality: N/A;   HEMOSTASIS CLIP PLACEMENT  05/03/2020   Procedure: HEMOSTASIS CLIP PLACEMENT;  Surgeon: Charna Elizabeth, MD;  Location: [facility] ENDOSCOPY;  Service: Endoscopy;;   HOT HEMOSTASIS N/A 05/03/2020   Procedure: HOT HEMOSTASIS (ARGON PLASMA COAGULATION/BICAP);  Surgeon: Charna Elizabeth, MD;  Location: [facility] ENDOSCOPY;  Service: Endoscopy;  Laterality: N/A;   IR IMAGING GUIDED PORT INSERTION  07/05/2018   POLYPECTOMY     PORT-A-CATH REMOVAL Right 05/20/2020   Procedure: REMOVAL PORT-A-CATH;  Surgeon: Purcell Nails, MD;  Location: [facility] OR;  Service: Thoracic;  Laterality: Right;   RIGHT/LEFT HEART CATH AND CORONARY ANGIOGRAPHY N/A 02/13/2020   Procedure: RIGHT/LEFT HEART CATH AND CORONARY ANGIOGRAPHY;  Surgeon: Lyn Records, MD;  Location: [facility] INVASIVE CV LAB;  Service: Cardiovascular;  Laterality: N/A;   SUBMUCOSAL INJECTION  05/02/2020   Procedure: SUBMUCOSAL INJECTION;  Surgeon:  Meridee Score Netty Starring., MD;  Location: [facility] ENDOSCOPY;  Service: Gastroenterology;;   TEE WITHOUT CARDIOVERSION N/A 05/14/2020   Procedure: TRANSESOPHAGEAL ECHOCARDIOGRAM (TEE);  Surgeon: Thurmon Fair, MD;  Location: [facility] ENDOSCOPY;  Service: Cardiovascular;  Laterality: N/A;   TEE WITHOUT CARDIOVERSION N/A 05/15/2020   Procedure: TRANSESOPHAGEAL ECHOCARDIOGRAM (TEE);  Surgeon: Purcell Nails, MD;  Location: [facility] OR;  Service: Open Heart Surgery;  Laterality: N/A;   TEE WITHOUT CARDIOVERSION N/A 03/03/2022   Procedure: TRANSESOPHAGEAL ECHOCARDIOGRAM (TEE);  Surgeon: Meriam Sprague, MD;  Location: [facility] ENDOSCOPY;  Service: Cardiovascular;  Laterality: N/A;    I have reviewed the social history and family history with the patient and they are unchanged from previous note.  ALLERGIES:  has No Known Allergies.  MEDICATIONS:  Current Outpatient Medications  Medication Sig Dispense Refill   lisinopril (ZESTRIL) 2.5 MG tablet Take 1 tablet (2.5 mg total) by mouth daily. 90 tablet 3   abiraterone acetate (ZYTIGA) 250 MG tablet Take 4 tablets (1,000 mg total) by mouth daily. Take on an empty stomach 1 hour before or 2 hours after a meal 120 tablet 2   aspirin EC 81 MG tablet Take 81 mg  by mouth daily. Swallow whole.     atorvastatin (LIPITOR) 80 MG tablet TAKE 1 TABLET EVERY DAY 90 tablet 3   Calcium Carbonate (CALCIUM 600 PO) Take 1,200 mg by mouth daily.     cholecalciferol (VITAMIN D3) 25 MCG (1000 UT) tablet Take 1,000 Units by mouth daily.     ferrous sulfate 325 (65 FE) MG EC tablet Take 1 tablet (325 mg total) by mouth 2 (two) times daily before a meal. 60 tablet 2   glipiZIDE (GLUCOTROL XL) 10 MG 24 hr tablet TAKE 1 TABLET TWICE DAILY 180 tablet 0   labetalol (NORMODYNE) 200 MG tablet TAKE 3 TABLETS EVERY MORNING AND TAKE 2 TABLETS EVERY EVENING 450 tablet 3   levothyroxine (SYNTHROID) 125 MCG tablet TAKE 1 TABLET EVERY DAY 90 tablet 2   metFORMIN (GLUCOPHAGE) 1000 MG tablet TAKE 1 TABLET  TWICE DAILY WITH MEALS 180 tablet 0   omeprazole (PRILOSEC) 40 MG capsule Take 1 capsule (40 mg total) by mouth daily. 90 capsule 1   potassium chloride SA (KLOR-CON M) 20 MEQ tablet Take 1 tablet (20 mEq total) by mouth daily. 90 tablet 3   predniSONE (DELTASONE) 5 MG tablet Take 1 tablet (5 mg total) by mouth daily with breakfast. 90 tablet 3   spironolactone (ALDACTONE) 25 MG tablet Take 1 tablet (25 mg total) by mouth every other day.     torsemide (DEMADEX) 20 MG tablet Take 1 tablet (20 mg total) by mouth daily. 90 tablet 3   No current facility-administered medications for this visit.    PHYSICAL EXAMINATION: ECOG PERFORMANCE STATUS: 0 - Asymptomatic  Vitals:   12/16/22 1017  BP: 126/65  Pulse: 69  Resp: 18  Temp: 99.2 F (37.3 C)  SpO2: 100%   Wt Readings from Last 3 Encounters:  12/16/22 196 lb 1.6 oz (89 kg)  11/10/22 194 lb (88 kg)  10/18/22 194 lb 12.8 oz (88.4 kg)     GENERAL:alert, no distress and comfortable SKIN: skin color normal, no rashes or significant lesions EYES: normal, Conjunctiva are pink and non-injected, sclera clear  NEURO: alert & oriented x 3 with fluent speech  LABORATORY DATA:  I have reviewed the data as listed    Latest Ref Rng & Units 12/16/2022    9:37 AM 10/18/2022    2:20 PM 08/20/2022    9:31 AM  CBC  WBC 4.0 - 10.5 K/uL 6.9  7.1  6.6   Hemoglobin 13.0 - 17.0 g/dL 16.1  09.6  04.5   Hematocrit 39.0 - 52.0 % 36.2  36.8  34.2   Platelets 150 - 400 K/uL 171  184  200         Latest Ref Rng & Units 12/16/2022    9:37 AM 10/18/2022    2:20 PM 08/20/2022    9:31 AM  CMP  Glucose 70 - 99 mg/dL 409  811  914   BUN 8 - 23 mg/dL 26  32  28   Creatinine 0.61 - 1.24 mg/dL 7.82  9.56  2.13   Sodium 135 - 145 mmol/L 141  142  140   Potassium 3.5 - 5.1 mmol/L 4.0  4.1  3.8   Chloride 98 - 111 mmol/L 106  104  103   CO2 22 - 32 mmol/L 24  25  27    Calcium 8.9 - 10.3 mg/dL 8.7  9.3  9.0   Total Protein 6.5 - 8.1 g/dL 6.2  6.6  6.5    Total Bilirubin 0.3 -  1.2 mg/dL 0.9  0.6  0.6   Alkaline Phos 38 - 126 U/L 50  52  51   AST 15 - 41 U/L 19  18  24    ALT 0 - 44 U/L RADIOGRAPHIC STUDIES: I have personally reviewed the radiological images as listed and agreed with the findings in the report. No results found.    No orders of the defined types were placed in this encounter.  All questions were answered. The patient knows to call the clinic with any problems, questions or concerns. No barriers to learning was detected.      Malachy Mood, MD 12/16/2022   Carolin Coy, CMA, am acting as scribe for Malachy Mood, MD.   I have reviewed the above documentation for accuracy and completeness, and I agree with th above.

## 2022-12-16 NOTE — Assessment & Plan Note (Signed)
-  He was started on Prolia Inlyta 2023, however his insurance does not cover it. -Will continue calcium and vitamin D, and repeat bone density scan in 2025. -I also encouraged him to have weightbearing exercise.

## 2022-12-16 NOTE — Patient Instructions (Signed)
Leuprolide Suspension for Injection (Prostate Cancer) What is this medication? LEUPROLIDE (loo PROE lide) reduces the symptoms of prostate cancer. It works by decreasing levels of the hormone testosterone in the body. This prevents prostate cancer cells from spreading or growing. This medicine may be used for other purposes; ask your health care provider or pharmacist if you have questions. COMMON BRAND NAME(S): Eligard, Lupron Depot, Lupron Depot-Ped, Lutrate Depot, Viadur What should I tell my care team before I take this medication? They need to know if you have any of these conditions: Diabetes Heart disease Heart failure High or low levels of electrolytes, such as magnesium, potassium, or sodium in your blood Irregular heartbeat or rhythm Seizures An unusual or allergic reaction to leuprolide, other medications, foods, dyes, or preservatives Pregnant or trying to get pregnant Breast-feeding How should I use this medication? This medication is injected under the skin or into a muscle. It is given by your care team in a hospital or clinic setting. Talk to your care team about the use of this medication in children. Special care may be needed. Overdosage: If you think you have taken too much of this medicine contact a poison control center or emergency room at once. NOTE: This medicine is only for you. Do not share this medicine with others. What if I miss a dose? Keep appointments for follow-up doses. It is important not to miss your dose. Call your care team if you are unable to keep an appointment. What may interact with this medication? Do not take this medication with any of the following: Cisapride Dronedarone Ketoconazole Levoketoconazole Pimozide Thioridazine This medication may also interact with the following: Other medications that cause heart rhythm changes This list may not describe all possible interactions. Give your health care provider a list of all the medicines,  herbs, non-prescription drugs, or dietary supplements you use. Also tell them if you smoke, drink alcohol, or use illegal drugs. Some items may interact with your medicine. What should I watch for while using this medication? Visit your care team for regular checks on your progress. Tell your care team if your symptoms do not start to get better or if they get worse. This medication may increase blood sugar. The risk may be higher in patients who already have diabetes. Ask your care team what you can do to lower the risk of diabetes while taking this medication. This medication may cause infertility. Talk to your care team if you are concerned about your fertility. Heart attacks and strokes have been reported with the use of this medication. Get emergency help if you develop signs or symptoms of a heart attack or stroke. Talk to your care team about the risks and benefits of this medication. What side effects may I notice from receiving this medication? Side effects that you should report to your care team as soon as possible: Allergic reactions--skin rash, itching, hives, swelling of the face, lips, tongue, or throat Heart attack--pain or tightness in the chest, shoulders, arms, or jaw, nausea, shortness of breath, cold or clammy skin, feeling faint or lightheaded Heart rhythm changes--fast or irregular heartbeat, dizziness, feeling faint or lightheaded, chest pain, trouble breathing High blood sugar (hyperglycemia)--increased thirst or amount of urine, unusual weakness or fatigue, blurry vision Mood swings, irritability, hostility Seizures Stroke--sudden numbness or weakness of the face, arm, or leg, trouble speaking, confusion, trouble walking, loss of balance or coordination, dizziness, severe headache, change in vision Thoughts of suicide or self-harm, worsening mood, feelings of depression Side   effects that usually do not require medical attention (report to your care team if they continue or  are bothersome): Bone pain Change in sex drive or performance General discomfort and fatigue Hot flashes Muscle pain Pain, redness, or irritation at injection site Swelling of the ankles, hands, or feet This list may not describe all possible side effects. Call your doctor for medical advice about side effects. You may report side effects to FDA at 1-800-FDA-1088. Where should I keep my medication? This medication is given in a hospital or clinic. It will not be stored at home. NOTE: This sheet is a summary. It may not cover all possible information. If you have questions about this medicine, talk to your doctor, pharmacist, or health care provider.  2023 Elsevier/Gold Standard (2021-10-19 00:00:00)  

## 2022-12-17 ENCOUNTER — Ambulatory Visit: Payer: Medicare PPO

## 2022-12-17 ENCOUNTER — Other Ambulatory Visit: Payer: Medicare PPO

## 2022-12-18 LAB — PROSTATE-SPECIFIC AG, SERUM (LABCORP): Prostate Specific Ag, Serum: <0.1 ng/mL (ref 0.0–4.0)

## 2022-12-28 ENCOUNTER — Other Ambulatory Visit (HOSPITAL_COMMUNITY): Payer: Self-pay

## 2022-12-29 ENCOUNTER — Other Ambulatory Visit: Payer: Self-pay | Admitting: Adult Health

## 2022-12-29 ENCOUNTER — Other Ambulatory Visit: Payer: Self-pay | Admitting: Cardiovascular Disease

## 2022-12-29 ENCOUNTER — Other Ambulatory Visit: Payer: Self-pay

## 2022-12-29 NOTE — Telephone Encounter (Signed)
Rx filled for 30 days. Pt needs a CPE for further refills.

## 2022-12-31 ENCOUNTER — Telehealth: Payer: Self-pay

## 2022-12-31 NOTE — Progress Notes (Unsigned)
Care Management & Coordination Services Pharmacy Note  01/03/2023 Name:  Matthew Serrano. MRN:  [id number] DOB:  03/15/1946  Summary: -BP at goal <140/90 -A1C at goal <7, due for update  Recommendations/Changes made from today's visit: -Counseled to check BP at home 2-3x/week and keep a log -Counseled to check sugars at least twice daily -Counseled on need for updated A1C and need for PCP visit, scheduled at patient request -ORDER A1C and urine microalbumin at next PCP visit, with PCP approval -Past due for eye exam, patient consents to contact for [facility] scheduling for eye fair dates  Follow up plan: PCP visit on 5/16 DM call in 1 month and 3 months Pharmacist visit in 5 months  Subjective: Matthew Serrano. is an 77 y.o. year old male who is a primary patient of Shirline Frees, NP.  The care coordination team was consulted for assistance with disease management and care coordination needs.    Engaged with patient by telephone for follow up visit.  Recent office visits: 11/10/22 Theresa Mulligan, LPN - For AWV  Recent consult visits: 12/16/22 Malachy Mood, MD (Oncology) - For Prostate cancer, calcium low, recommended calcium supplement OTC  10/18/22 Malachy Mood, MD (Oncology) For Prostate cancer, no medication changes  10/05/22 Malachy Mood, MD (Oncology). Orders only, Zometa for hypocalcemia associated with malignant neoplasm of prostate treatment  09/28/22 Tonny Bollman, MD (Cardio), For ECHO and cardiac f/u. Removed PRN Metoprolol for scan from medication list  Hospital visits: None in previous 6 months   Objective:  Lab Results  Component Value Date   CREATININE 1.32 (H) 12/16/2022   BUN 26 (H) 12/16/2022   GFR 58.37 (L) 03/17/2022   EGFR 72 05/17/2022   GFRNONAA 56 (L) 12/16/2022   GFRAA 99 07/03/2020   NA 141 12/16/2022   K 4.0 12/16/2022   CALCIUM 8.7 (L) 12/16/2022   CO2 24 12/16/2022   GLUCOSE 186 (H) 12/16/2022    Lab Results  Component Value Date/Time   HGBA1C  6.1 (H) 02/26/2022 06:16 PM   HGBA1C 6.2 07/30/2021 07:36 AM   HGBA1C 6.3 02/22/2020 09:29 AM   HGBA1C 6.2 10/23/2019 10:15 AM   GFR 58.37 (L) 03/17/2022 07:34 AM   GFR 58.63 (L) 07/30/2021 07:36 AM   MICROALBUR <0.7 04/28/2016 07:56 AM   MICROALBUR 0.8 11/02/2013 08:54 AM    Last diabetic Eye exam:  Lab Results  Component Value Date/Time   HMDIABEYEEXA borderline ocular HTN 12/03/2010 12:00 AM    Last diabetic Foot exam: No results found for: "HMDIABFOOTEX"   Lab Results  Component Value Date   CHOL 133 07/30/2021   HDL 62.00 07/30/2021   LDLCALC 56 07/30/2021   LDLDIRECT 105.0 04/28/2016   TRIG 75.0 07/30/2021   CHOLHDL 2 07/30/2021       Latest Ref Rng & Units 12/16/2022    9:37 AM 10/18/2022    2:20 PM 08/20/2022    9:31 AM  Hepatic Function  Total Protein 6.5 - 8.1 g/dL 6.2  6.6  6.5   Albumin 3.5 - 5.0 g/dL 4.0  4.2  4.1   AST 15 - 41 U/L 19  18  24    ALT 0 - 44 U/L 15  15  20    Alk Phosphatase 38 - 126 U/L 50  52  51   Total Bilirubin 0.3 - 1.2 mg/dL 0.9  0.6  0.6     Lab Results  Component Value Date/Time   TSH 2.17 03/17/2022 07:34 AM   TSH 0.301 (  L) 02/26/2022 06:16 PM   TSH 1.51 07/30/2021 07:36 AM       Latest Ref Rng & Units 12/16/2022    9:37 AM 10/18/2022    2:20 PM 08/20/2022    9:31 AM  CBC  WBC 4.0 - 10.5 K/uL 6.9  7.1  6.6   Hemoglobin 13.0 - 17.0 g/dL 16.1  09.6  04.5   Hematocrit 39.0 - 52.0 % 36.2  36.8  34.2   Platelets 150 - 400 K/uL 171  184  200     Lab Results  Component Value Date/Time   VITAMINB12 227 05/16/2014 04:04 PM    Clinical ASCVD: No  The 10-year ASCVD risk score (Arnett DK, et al., 2019) is: 31.8%   Values used to calculate the score:     Age: 23 years     Sex: Male     Is Non-Hispanic African American: Yes     Diabetic: Yes     Tobacco smoker: No     Systolic Blood Pressure: 126 mmHg     Is BP treated: Yes     HDL Cholesterol: 62 mg/dL     Total Cholesterol: 133 mg/dL       11/29/8117    1:47 AM  07/30/2022    9:45 AM 05/17/2022    9:04 AM  Depression screen PHQ 2/9  Decreased Interest 0 0 0  Down, Depressed, Hopeless 0 0 0  PHQ - 2 Score 0 0 0     Social History   Tobacco Use  Smoking Status Former  Smokeless Tobacco Never  Tobacco Comments   quit 2005   BP Readings from Last 3 Encounters:  12/16/22 126/65  10/18/22 (!) 141/72  09/28/22 128/82   Pulse Readings from Last 3 Encounters:  12/16/22 69  10/18/22 73  09/28/22 72   Wt Readings from Last 3 Encounters:  12/16/22 196 lb 1.6 oz (89 kg)  11/10/22 194 lb (88 kg)  10/18/22 194 lb 12.8 oz (88.4 kg)   BMI Readings from Last 3 Encounters:  12/16/22 29.82 kg/m  11/10/22 29.50 kg/m  10/18/22 29.62 kg/m    No Known Allergies  Medications Reviewed Today     Reviewed by Sherrill Raring, RPH (Pharmacist) on 01/03/23 at 1443  Med List Status: <None>   Medication Order Taking? Sig Documenting Provider Last Dose Status Informant  abiraterone acetate (ZYTIGA) 250 MG tablet [id number]  Take 4 tablets (1,000 mg total) by mouth daily. Take on an empty stomach 1 hour before or 2 hours after a meal Malachy Mood, MD  Active   aspirin EC 81 MG tablet [id number] No Take 81 mg by mouth daily. Swallow whole. [provider] Taking Active Self  atorvastatin (LIPITOR) 80 MG tablet [id number] No TAKE 1 TABLET EVERY DAY [name], Cory, NP Taking Active   Calcium Carbonate (CALCIUM 600 PO) [id number] No Take 1,200 mg by mouth daily. [provider] Taking Active Self  cholecalciferol (VITAMIN D3) 25 MCG (1000 UT) tablet [id number] No Take 1,000 Units by mouth daily. [provider] Taking Active Self  ferrous sulfate 325 (65 FE) MG EC tablet [id number] No Take 1 tablet (325 mg total) by mouth 2 (two) times daily before a meal. Meredith Pel, NP Taking Active Self           Med Note Jomarie Longs, REGEENA   Tue May 13, 2020  2:46 PM)    glipiZIDE (GLUCOTROL XL) 10 MG 24 hr tablet [id number]  TAKE 1 TABLET TWICE  DAILY [name], Kandee Keen, NP  Active   labetalol (NORMODYNE) 200 MG tablet [id number]  TAKE 3 TABLETS EVERY MORNING AND TAKE 2 TABLETS EVERY EVENING [name], Cory, NP  Active   levothyroxine (SYNTHROID) 125 MCG tablet [id number] No TAKE 1 TABLET EVERY DAY [name], Cory, NP Taking Active   lisinopril (ZESTRIL) 2.5 MG tablet [id number]  Take 1 tablet (2.5 mg total) by mouth daily. [name], Kandee Keen, NP  Active   metFORMIN (GLUCOPHAGE) 1000 MG tablet [id number] No TAKE 1 TABLET TWICE DAILY WITH MEALS [name], Kandee Keen, NP Taking Active   omeprazole (PRILOSEC) 40 MG capsule [id number]  TAKE 1 CAPSULE EVERY Su Monks, MD  Active   potassium chloride SA (KLOR-CON M) 20 MEQ tablet [id number] No Take 1 tablet (20 mEq total) by mouth daily. Tereso Newcomer T, PA-C Taking Active Self  predniSONE (DELTASONE) 5 MG tablet [id number]  Take 1 tablet (5 mg total) by mouth daily with breakfast. Malachy Mood, MD  Active   spironolactone (ALDACTONE) 25 MG tablet [id number] No Take 1 tablet (25 mg total) by mouth every other day. Jonita Albee, PA-C Taking Active   torsemide (DEMADEX) 20 MG tablet [id number] No Take 1 tablet (20 mg total) by mouth daily. Tonny Bollman, MD Taking Active Self            SDOH:  (Social Determinants of Health) assessments and interventions performed: Yes SDOH Interventions    Flowsheet Row Care Coordination from 01/03/2023 in [facility] Office Visit from 11/10/2022 in [facility] Coordination from 10/18/2022 in [facility] Telephone from 07/30/2022 in [facility] Coordination Chronic Care Management from 01/29/2021 in [facility] [place] Clinical Support from 11/12/2020 in [facility] [place]  SDOH Interventions        Food Insecurity Interventions Intervention Not Indicated Intervention Not Indicated Intervention Not Indicated Intervention Not Indicated  -- Intervention Not Indicated  Housing Interventions Intervention Not Indicated Intervention Not Indicated -- Intervention Not Indicated -- Intervention Not Indicated  Transportation Interventions -- Intervention Not Indicated Intervention Not Indicated Intervention Not Indicated Intervention Not Indicated Intervention Not Indicated  Utilities Interventions -- Intervention Not Indicated Intervention Not Indicated Intervention Not Indicated -- --  Alcohol Usage Interventions -- Intervention Not Indicated (Score <7) -- -- -- --  Financial Strain Interventions -- Intervention Not Indicated -- -- Intervention Not Indicated Intervention Not Indicated  Physical Activity Interventions -- Patient Refused, Other (Comments) -- -- -- Intervention Not Indicated  Stress Interventions -- Intervention Not Indicated -- -- -- Intervention Not Indicated  Social Connections Interventions -- Intervention Not Indicated -- -- -- Intervention Not Indicated       Medication Assistance: None required.  Patient affirms current coverage meets needs.  Medication Access: Within the past 30 days, how often has patient missed a dose of medication? None Is a pillbox or other method used to improve adherence? Yes  Factors that may affect medication adherence? no barriers identified Are meds synced by current pharmacy? No  Are meds delivered by current pharmacy? Yes  Does patient experience delays in picking up medications due to transportation concerns? No   [facility] Reviewed: Is patient disadvantaged to use [facility]?: Yes  Current Rx insurance plan: [employer] Name and location of Current pharmacy:  [facility] - [place], [place] - [address] [place] [address] Phone: [phone] Fax: [phone] [facility] services reviewed with patient today?: No  Patient requests to transfer care to [facility]?: No  Reason patient declined to change  pharmacies: Disadvantaged due to insurance/mail order  Compliance/Adherence/Medication fill history: Care Gaps: AWV - completed 11/10/2022 Last eye exam - 09/25/2013 - pt consents to [facility] reach out to schedule eye exam Last foot exam: 01/22/2021 Last BP - 126/65 on 12/16/2022 Last A1C - 6.1 on 02/26/2022 Tdap - overdue Urine ACR - overdue Covid - overdue  [facility] Drugs: Lisinopril 2.5 mg - last filled 12/29/2022 90 DS at [facility] Atorvastatin 80 mg - last filled 12/24/2022 90 DS at [facility] Glipizide 10 mg - last filled 11/22/2022 90 DS at [facility] Metformin 1000 mg - last filled 07/08/2022 90 DS at [facility], patient previously stated his previous pharmacy had been sending him too much metformin so he still has plenty on hand.  Assessment/Plan   Hypertension (BP goal <130/80) -Controlled -Current treatment: Labetalol 200mg  3 tabs qam and 2 qpm Appropriate, Effective, Safe, Accessible Spironolactone 25mg  1 qod Appropriate, Effective, Safe, Accessible Torsemide 20mg  1 qd Appropriate, Effective, Safe, Accessible Lisinopirl 2.5mg  1 qd Appropriate, Effective, Safe, Accessible -Medications previously tried: Clonidine, Fosinopril, Lasix, Nifedipine -Current home readings: 3x/week - 124/76 -Current dietary habits: mindful of salt intake -Current exercise habits: not at this time, plans on getting back into walking now that cardiologist cleared him -Denies hypotensive/hypertensive symptoms -Educated on BP goals and benefits of medications for prevention of heart attack, stroke and kidney damage; Daily salt intake goal < 2300 mg; Importance of home blood pressure monitoring; Proper BP monitoring technique; -Counseled to monitor BP at home daily, document, and provide log at future appointments -Counseled on diet and exercise extensively Recommended check bp daily and keep a log. -Recommend addition of low-dose ACEI such as lisinopril 2.5mg  once daily for kidney protection with  diabetes  Diabetes (A1c goal <7%) -Controlled -Current medications: Metformin 1000mg  BID Appropriate, Effective, Safe, Accessible Glipizide XL 10mg  1 BID Appropriate, Effective, Safe, Accessible -Medications previously tried: Pioglitazone  -Current home glucose readings fasting glucose: 110 post prandial glucose: was out of strips for a bit so hasn't checked, resuming today -Denies hypoglycemic/hyperglycemic symptoms -Current meal patterns:  Reports no change in routine with low sugars -Current exercise: see above -Educated on A1c and blood sugar goals; Complications of diabetes including kidney damage, retinal damage, and cardiovascular disease; Prevention and management of hypoglycemic episodes; -Counseled to check feet daily and get yearly eye exams -Counseled to continue current medication -Scheduled f/u with PCP and requested updated A1C  Sherrill Raring Clinical Pharmacist [phone]

## 2022-12-31 NOTE — Progress Notes (Signed)
Care Management & Coordination Services Pharmacy Team  Reason for Encounter: Appointment Reminder  Contacted patient to confirm telephone appointment with Delano Metz, PharmD on 01/03/2023 at 2:30. Unsuccessful outreach. Left voicemail for patient to return call.  Do you have any problems getting your medications?  If yes what types of problems are you experiencing?   What is your top health concern you would like to discuss at your upcoming visit?   Have you seen any other providers since your last visit with PCP?   Care Gaps: AWV - completed 11/10/2022 Last eye exam - 09/25/2013 Last foot exam: 01/22/2021 Last BP - 126/65 on 12/16/2022 Last A1C - 6.1 on 02/26/2022 Tdap - overdue Urine ACR - overdue Covid - overdue   Star Rating Drugs: Lisinopril 2.5 mg - last filled 12/29/2022 90 DS at [facility] Atorvastatin 80 mg - last filled 12/24/2022 90 DS at [facility] Glipizide 10 mg - last filled 11/22/2022 90 DS at [facility] Metformin 1000 mg - last filled 07/08/2022 90 DS at [facility], patient previously stated his previous pharmacy had been sending him too much metformin so he still has plenty on hand.   Inetta Fermo [facility]  Clinical Pharmacist Assistant [phone]

## 2023-01-03 ENCOUNTER — Ambulatory Visit: Payer: Medicare PPO

## 2023-01-06 ENCOUNTER — Encounter: Payer: Self-pay | Admitting: Adult Health

## 2023-01-06 ENCOUNTER — Ambulatory Visit (INDEPENDENT_AMBULATORY_CARE_PROVIDER_SITE_OTHER): Payer: Medicare PPO | Admitting: Adult Health

## 2023-01-06 VITALS — BP 138/70 | HR 62 | Temp 98.8°F | Ht 68.0 in | Wt 197.0 lb

## 2023-01-06 DIAGNOSIS — E119 Type 2 diabetes mellitus without complications: Secondary | ICD-10-CM | POA: Diagnosis not present

## 2023-01-06 DIAGNOSIS — E1169 Type 2 diabetes mellitus with other specified complication: Secondary | ICD-10-CM

## 2023-01-06 DIAGNOSIS — Z7984 Long term (current) use of oral hypoglycemic drugs: Secondary | ICD-10-CM

## 2023-01-06 DIAGNOSIS — I10 Essential (primary) hypertension: Secondary | ICD-10-CM

## 2023-01-06 LAB — POCT GLYCOSYLATED HEMOGLOBIN (HGB A1C): Hemoglobin A1C: 6.2 % — AB (ref 4.0–5.6)

## 2023-01-06 NOTE — Progress Notes (Signed)
Subjective:    Patient ID: Matthew Serrano., male    DOB: 01/10/1946, 77 y.o.   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  He presents to the office today for follow up regarding DM and HTN. He was last seen about 10 months ago. He reports that he is doing pretty well. He continues to go through treatment for metastatic prostate cancer.   DM Type II -maintained on glipizide 10 mg ER twice daily and metformin 1000 mg twice daily.  He does check his blood sugars twice a week with fasting readings in the 80s to 110s and postprandial in the 140s to 180s. He has not been walking much but plans to get back into it.  Lab Results  Component Value Date   HGBA1C 6.1 (H) 02/26/2022    Hypertension-prescribed doxazosin 8 mg daily,  Labetalol  600 mg in the morning and 400 mg in the evening as well as torsemide 20 mg twice daily.  He denies dizziness, lightheadedness, chest pain, or shortness of breath.  He does check his blood sugars at home with readings in the 120s to 130s over 70s. 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) 05/14/2020   History of nuclear stress test    Myoview 8/18: EF 64, inferobasal thinning, no ischemia, low risk   HYPERLIPIDEMIA 07/06/2007   HYPERTENSION 07/06/2007   HYPOTHYROIDISM 07/06/2007   Leg pain    ABIs 8/18:  Normal    Obesity    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   23 mm human aortic root graft with 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 and reimplantation of left main and right coronary arteries   Severe aortic insufficiency 05/13/2020   acute onset in setting of bacterial endocarditis   Severe aortic stenosis     Social History   Socioeconomic History   Marital status: Widowed    Spouse name: Not on file   Number of children: 0   Years of education: Not on file   Highest education level: Not on file  Occupational History   Not on file  Tobacco Use   Smoking status: Former   Smokeless tobacco: Never   Tobacco comments:    quit 2005  Vaping Use   Vaping Use: Never used  Substance and Sexual Activity   Alcohol use: Not Currently    Alcohol/week: 14.0 standard drinks of alcohol    Types: 14 Cans of beer per week    Comment: stopped in 07/2022   Drug use: No   Sexual activity: Not on file  Other Topics Concern   Not on file  Social  History Narrative   He works as a Estate agent    Not married    No kids          Social Determinants of Corporate investment banker Strain: Low Risk  (11/10/2022)   Overall Financial Resource Strain (CARDIA)    Difficulty of Paying Living Expenses: Not hard at all  Food Insecurity: No Food Insecurity (01/03/2023)   Hunger Vital Sign    Worried About Running Out of Food in the Last Year: Never true    Ran Out of Food in the Last Year: Never true  Transportation Needs: No Transportation Needs (11/10/2022)   PRAPARE - Administrator, Civil Service (Medical): No    Lack of Transportation (Non-Medical): No   Physical Activity: Inactive (11/10/2022)   Exercise Vital Sign    Days of Exercise per Week: 0 days    Minutes of Exercise per Session: 0 min  Stress: No Stress Concern Present (11/10/2022)   [employer] of Occupational Health - Occupational Stress Questionnaire    Feeling of Stress : Not at all  Social Connections: Socially Isolated (11/10/2022)   Social Connection and Isolation Panel [NHANES]    Frequency of Communication with Friends and Family: More than three times a week    Frequency of Social Gatherings with Friends and Family: More than three times a week    Attends Religious Services: Never    Database administrator or Organizations: No    Attends Banker Meetings: Never    Marital Status: Widowed  Intimate Partner Violence: Not At Risk (11/10/2022)   Humiliation, Afraid, Rape, and Kick questionnaire    Fear of Current or Ex-Partner: No    Emotionally Abused: No    Physically Abused: No    Sexually Abused: No    Past Surgical History:  Procedure Laterality Date   AORTIC VALVE REPLACEMENT N/A 05/15/2020   Procedure: AORTIC VALVE REPLACEMENT (AVR) SIZE 23 MM,  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;  Surgeon: Purcell Nails, MD;  Location: [facility] OR;  Service: Open Heart Surgery;  Laterality: N/A;   ASCENDING AORTIC ROOT REPLACEMENT N/A 05/15/2020   Procedure: HUMAN ALLOGRAFT AORTIC ROOT REPLACEMENT;  Surgeon: Purcell Nails, MD;  Location: [facility] OR;  Service: Open Heart Surgery;  Laterality: N/A;   BUBBLE STUDY  03/03/2022   Procedure: BUBBLE STUDY;  Surgeon: Meriam Sprague, MD;  Location: [facility] ENDOSCOPY;  Service: Cardiovascular;;   COLONOSCOPY     COLONOSCOPY N/A 05/03/2020   Procedure: COLONOSCOPY;  Surgeon: Charna Elizabeth, MD;  Location: [facility] ENDOSCOPY;  Service: Endoscopy;  Laterality: N/A;   COLONOSCOPY W/ BIOPSIES AND POLYPECTOMY     ENTEROSCOPY N/A 05/02/2020   Procedure: ENTEROSCOPY;  Surgeon: Meridee Score Netty Starring., MD;  Location: [facility]  ENDOSCOPY;  Service: Gastroenterology;  Laterality: N/A;   HEMOSTASIS CLIP PLACEMENT  05/03/2020   Procedure: HEMOSTASIS CLIP PLACEMENT;  Surgeon: Charna Elizabeth, MD;  Location: [facility] ENDOSCOPY;  Service: Endoscopy;;   HOT HEMOSTASIS N/A 05/03/2020   Procedure: HOT HEMOSTASIS (ARGON PLASMA COAGULATION/BICAP);  Surgeon: Charna Elizabeth, MD;  Location: [facility] ENDOSCOPY;  Service: Endoscopy;  Laterality: N/A;   IR IMAGING GUIDED PORT INSERTION  07/05/2018   POLYPECTOMY     PORT-A-CATH REMOVAL Right 05/20/2020   Procedure: REMOVAL PORT-A-CATH;  Surgeon: Purcell Nails, MD;  Location: [facility] OR;  Service: Thoracic;  Laterality: Right;   RIGHT/LEFT HEART CATH AND CORONARY ANGIOGRAPHY N/A 02/13/2020   Procedure: RIGHT/LEFT HEART CATH AND CORONARY ANGIOGRAPHY;  Surgeon: Lyn Records, MD;  Location: [facility] INVASIVE CV LAB;  Service: Cardiovascular;  Laterality: N/A;   SUBMUCOSAL INJECTION  05/02/2020   Procedure: SUBMUCOSAL INJECTION;  Surgeon: Meridee Score Netty Starring., MD;  Location: [facility] ENDOSCOPY;  Service: Gastroenterology;;   TEE WITHOUT CARDIOVERSION N/A 05/14/2020   Procedure: TRANSESOPHAGEAL ECHOCARDIOGRAM (TEE);  Surgeon: Thurmon Fair, MD;  Location: [facility] ENDOSCOPY;  Service: Cardiovascular;  Laterality: N/A;   TEE WITHOUT CARDIOVERSION N/A 05/15/2020   Procedure: TRANSESOPHAGEAL ECHOCARDIOGRAM (TEE);  Surgeon: Purcell Nails, MD;  Location: [facility] OR;  Service: Open Heart Surgery;  Laterality: N/A;   TEE WITHOUT CARDIOVERSION N/A 03/03/2022   Procedure: TRANSESOPHAGEAL ECHOCARDIOGRAM (TEE);  Surgeon: Meriam Sprague, MD;  Location: [facility] ENDOSCOPY;  Service: Cardiovascular;  Laterality: N/A;    Family History  Problem Relation Age of Onset   Aneurysm Mother        Brain aneurysm    Breast cancer Sister    Prostate cancer Brother    Hypertension Other    Breast cancer Other        family history   Colon cancer Neg Hx    Esophageal cancer Neg Hx    Stomach cancer Neg Hx    Rectal cancer Neg Hx     No Known  Allergies  Current Outpatient Medications on File Prior to Visit  Medication Sig Dispense Refill   abiraterone acetate (ZYTIGA) 250 MG tablet Take 4 tablets (1,000 mg total) by mouth daily. Take on an empty stomach 1 hour before or 2 hours after a meal 120 tablet 2   aspirin EC 81 MG tablet Take 81 mg by mouth daily. Swallow whole.     atorvastatin (LIPITOR) 80 MG tablet TAKE 1 TABLET EVERY DAY 90 tablet 3   Calcium Carbonate (CALCIUM 600 PO) Take 1,200 mg by mouth daily.     cholecalciferol (VITAMIN D3) 25 MCG (1000 UT) tablet Take 1,000 Units by mouth daily.     ferrous sulfate 325 (65 FE) MG EC tablet Take 1 tablet (325 mg total) by mouth 2 (two) times daily before a meal. 60 tablet 2   glipiZIDE (GLUCOTROL XL) 10 MG 24 hr tablet TAKE 1 TABLET TWICE DAILY 180 tablet 0   labetalol (NORMODYNE) 200 MG tablet TAKE 3 TABLETS EVERY MORNING AND TAKE 2 TABLETS EVERY EVENING 150 tablet 0   levothyroxine (SYNTHROID) 125 MCG tablet TAKE 1 TABLET EVERY DAY 90 tablet 2   lisinopril (ZESTRIL) 2.5 MG tablet Take 1 tablet (2.5 mg total) by mouth daily. 90 tablet 3   metFORMIN (GLUCOPHAGE) 1000 MG tablet TAKE 1 TABLET TWICE DAILY WITH MEALS 180 tablet 0   omeprazole (PRILOSEC) 40 MG capsule TAKE 1 CAPSULE EVERY DAY 90 capsule 3   potassium chloride SA (KLOR-CON M) 20 MEQ tablet Take 1 tablet (20 mEq total) by mouth daily. 90 tablet 3   predniSONE (DELTASONE) 5 MG tablet Take 1 tablet (5 mg total) by mouth daily with breakfast. 90 tablet 3   spironolactone (ALDACTONE) 25 MG tablet Take 1 tablet (25 mg total) by mouth every other day.     torsemide (DEMADEX) 20 MG tablet Take 1 tablet (20 mg total) by mouth daily. 90 tablet 3   No current facility-administered medications on file prior to visit.    BP 138/70   Pulse 62   Temp 98.8 F (37.1 C) (Oral)   Ht 5\' 8"  (1.727 m)   Wt 197 lb (89.4 kg)   SpO2 98%   BMI 29.95 kg/m  Objective:   Physical Exam Vitals and nursing note reviewed.   Constitutional:      Appearance: Normal appearance. He is obese.  Cardiovascular:     Rate and Rhythm: Normal rate and regular rhythm.     Pulses: Normal pulses.     Heart sounds: Murmur heard.  Pulmonary:     Effort: Pulmonary effort is normal.     Breath sounds: Normal breath sounds.  Skin:    General: Skin is warm and dry.  Neurological:     General: No focal deficit present.     Mental Status: He is alert and oriented to person, place, and time.  Psychiatric:        Mood and Affect: Mood normal.        Behavior: Behavior normal.        Thought Content: Thought content normal.        Judgment: Judgment normal.       Assessment & Plan:  1. Essential hypertension - Well controlled. No change in medication   2. Type 2 diabetes mellitus with other specified complication, without long-term current use of insulin (HCC)  - POC HgB A1c- 6.2 - well controlled. No change in medication  - encouraged exercise and healthy diet  - Microalbumin/Creatinine Ratio, Urine; Future  3. Diabetes mellitus treated with oral medication (HCC)  - POC HgB A1c - Microalbumin/Creatinine Ratio, Urine; Future  Shirline Frees, NP

## 2023-01-06 NOTE — Patient Instructions (Addendum)
Your A1c was 6.2 - this is great  Please follow up in early August for your physical exam

## 2023-01-07 LAB — MICROALBUMIN / CREATININE URINE RATIO
Creatinine,U: 95 mg/dL
Microalb Creat Ratio: 0.7 mg/g (ref 0.0–30.0)
Microalb, Ur: <0.7 mg/dL (ref 0.0–1.9)

## 2023-01-19 ENCOUNTER — Other Ambulatory Visit: Payer: Self-pay | Admitting: Adult Health

## 2023-01-19 ENCOUNTER — Other Ambulatory Visit: Payer: Self-pay | Admitting: Hematology

## 2023-01-19 ENCOUNTER — Other Ambulatory Visit: Payer: Self-pay

## 2023-01-19 ENCOUNTER — Other Ambulatory Visit (HOSPITAL_COMMUNITY): Payer: Self-pay

## 2023-01-19 DIAGNOSIS — C61 Malignant neoplasm of prostate: Secondary | ICD-10-CM

## 2023-01-19 MED ORDER — ABIRATERONE ACETATE 250 MG PO TABS
1000.0000 mg | ORAL_TABLET | Freq: Every day | ORAL | 2 refills | Status: DC
Start: 2023-01-19 — End: 2023-04-15
  Filled 2023-01-19: qty 120, 30d supply, fill #0
  Filled 2023-02-17: qty 120, 30d supply, fill #1
  Filled 2023-03-16: qty 120, 30d supply, fill #2

## 2023-01-25 ENCOUNTER — Other Ambulatory Visit (HOSPITAL_COMMUNITY): Payer: Self-pay

## 2023-01-31 ENCOUNTER — Telehealth: Payer: Self-pay

## 2023-01-31 NOTE — Progress Notes (Unsigned)
Care Management & Coordination Services Pharmacy Team  Reason for Encounter: Diabetes  Contacted patient to discuss diabetes disease state. {US HC Outreach:28874}  Current antihyperglycemic regimen:  Glipzide 10 mg twice daily Metformin 1000 mg twice daily  Patient verbally confirms he is taking the above medications as directed. {yes/no:20286}  What diet changes have been made to improve diabetes control? Patient follows no specific diet Breakfast - eggs and grits Lunch - a burger or sandwich Dinner - meat with vegetables Caffeine intake: coffee in the AM and sugar free coke What recent interventions/DTPs have been made to improve glycemic control:  No recent interventions  Have there been any recent hospitalizations or ED visits since last visit with PharmD? No recent hospital visits.   Patient {reports/denies:24182} hypoglycemic symptoms, including {Hypoglycemic Symptoms:3049003}  Patient {reports/denies:24182} hyperglycemic symptoms, including {symptoms; hyperglycemia:17903}  How often are you checking your blood sugar? {BG Testing frequency:23922}  What are your blood sugars ranging?  Fasting: *** After meals: ***  During the week, how often does your blood glucose drop below 70? {LowBGfrequency:24142}  Are you checking your feet daily/regularly? {yes/no:20286}   Adherence Review: Is the patient currently on a STATIN medication? Yes Is the patient currently on ACE/ARB medication? Yes Does the patient have >5 day gap between last estimated fill dates? No  Care Gaps: AWV - completed 11/10/2022 Last eye exam - 09/25/2013 Last foot exam: 01/22/2021 Last BP - 138/70 on 01/06/2023 Last A1C - 6.2 on 01/06/2023 Tdap - overdue Covid - overdue   Star Rating Drugs: Atorvastatin 80 mg - last filled 12/24/2022 90 DS at [facility] Glipizide 10 mg - last filled 11/22/2022 90 DS at [facility] Lisinopril 2.5 mg - last filled 12/29/2022 90 DS at [facility] Metformin 1000 mg - last  filled 07/08/2022 90 DS at [facility], patient previously stated his previous pharmacy had been sending him too much metformin so he still has plenty on hand.    Chart Updates:  Recent office visits:  01/06/2023 Shirline Frees NP - Patient was seen for essential hypertenison and additional concerns. No medication changes.   Recent consult visits:  None  Hospital visits:  None  Medications: Outpatient Encounter Medications as of 01/31/2023  Medication Sig   abiraterone acetate (ZYTIGA) 250 MG tablet Take 4 tablets (1,000 mg total) by mouth daily. Take on an empty stomach 1 hour before or 2 hours after a meal   aspirin EC 81 MG tablet Take 81 mg by mouth daily. Swallow whole.   atorvastatin (LIPITOR) 80 MG tablet TAKE 1 TABLET EVERY DAY   Calcium Carbonate (CALCIUM 600 PO) Take 1,200 mg by mouth daily.   cholecalciferol (VITAMIN D3) 25 MCG (1000 UT) tablet Take 1,000 Units by mouth daily.   ferrous sulfate 325 (65 FE) MG EC tablet Take 1 tablet (325 mg total) by mouth 2 (two) times daily before a meal.   glipiZIDE (GLUCOTROL XL) 10 MG 24 hr tablet TAKE 1 TABLET TWICE DAILY   labetalol (NORMODYNE) 200 MG tablet TAKE 3 TABLETS EVERY MORNING AND TAKE 2 TABLETS EVERY EVENING   levothyroxine (SYNTHROID) 125 MCG tablet TAKE 1 TABLET EVERY DAY   lisinopril (ZESTRIL) 2.5 MG tablet Take 1 tablet (2.5 mg total) by mouth daily.   metFORMIN (GLUCOPHAGE) 1000 MG tablet TAKE 1 TABLET TWICE DAILY WITH MEALS   omeprazole (PRILOSEC) 40 MG capsule TAKE 1 CAPSULE EVERY DAY   potassium chloride SA (KLOR-CON M) 20 MEQ tablet Take 1 tablet (20 mEq total) by mouth daily.   predniSONE (DELTASONE)  5 MG tablet Take 1 tablet (5 mg total) by mouth daily with breakfast.   spironolactone (ALDACTONE) 25 MG tablet Take 1 tablet (25 mg total) by mouth every other day.   torsemide (DEMADEX) 20 MG tablet Take 1 tablet (20 mg total) by mouth daily.   No facility-administered encounter medications on file as of 01/31/2023.   Fill History:  Dispensed Days Supply Quantity Provider Pharmacy  abiraterone acetate (ZYTIGA) 250 MG tablet 01/26/2023 30 120 tablet      Dispensed Days Supply Quantity Provider Pharmacy  atorvastatin 80 mg tablet 12/24/2022 90 90 tablet      Dispensed Days Supply Quantity Provider Pharmacy  glipizide ER 10 mg tablet, extended release 24 hr 11/22/2022 90 180 tablet      Dispensed Days Supply Quantity Provider Pharmacy  labetalol 200 mg tablet 01/21/2023 60 300 tablet      Dispensed Days Supply Quantity Provider Pharmacy  levothyroxine 125 mcg tablet 01/15/2023 90 90 tablet      Dispensed Days Supply Quantity Provider Pharmacy  lisinopril 2.5 mg tablet 12/29/2022 90 90 tablet      Dispensed Days Supply Quantity Provider Pharmacy  metformin 1,000 mg tablet 07/08/2022 90 180 tablet      Dispensed Days Supply Quantity Provider Pharmacy  omeprazole 40 mg capsule,delayed release 12/29/2022 90 90 capsule      Dispensed Days Supply Quantity Provider Pharmacy  potassium chloride ER 20 mEq tablet,extended release(part/cryst) 07/16/2022 90 90 tablet      Dispensed Days Supply Quantity Provider Pharmacy  predniSONE (DELTASONE) 5 MG tablet 01/26/2023 90 90 tablet      Dispensed Days Supply Quantity Provider Pharmacy  spironolactone 25 mg tablet 12/29/2022 90 45 tablet      Dispensed Days Supply Quantity Provider Pharmacy  torsemide 20 mg tablet 12/29/2022 90 90 tablet     Recent Relevant Labs: Lab Results  Component Value Date/Time   HGBA1C 6.2 (A) 01/06/2023 08:02 AM   HGBA1C 6.1 (H) 02/26/2022 06:16 PM   HGBA1C 6.2 07/30/2021 07:36 AM   HGBA1C 6.3 02/22/2020 09:29 AM   HGBA1C 6.2 10/23/2019 10:15 AM   MICROALBUR <0.7 01/07/2023 08:48 AM   MICROALBUR <0.7 04/28/2016 07:56 AM    Kidney Function Lab Results  Component Value Date/Time   CREATININE 1.32 (H) 12/16/2022 09:37 AM   CREATININE 1.28 (H) 10/18/2022 02:20 PM   CREATININE 1.08 05/17/2022 09:18 AM   CREATININE 1.13  07/29/2020 08:31 AM   GFR 58.37 (L) 03/17/2022 07:34 AM   GFRNONAA 56 (L) 12/16/2022 09:37 AM   GFRAA 99 07/03/2020 10:16 AM   GFRAA >60 04/29/2020 09:48 AM    Inetta Fermo CMA  Clinical Pharmacist Assistant [phone]

## 2023-02-08 ENCOUNTER — Other Ambulatory Visit: Payer: Self-pay | Admitting: Adult Health

## 2023-02-08 DIAGNOSIS — E119 Type 2 diabetes mellitus without complications: Secondary | ICD-10-CM

## 2023-02-17 ENCOUNTER — Other Ambulatory Visit (HOSPITAL_COMMUNITY): Payer: Self-pay

## 2023-02-21 ENCOUNTER — Other Ambulatory Visit (HOSPITAL_COMMUNITY): Payer: Self-pay

## 2023-03-07 ENCOUNTER — Other Ambulatory Visit: Payer: Self-pay | Admitting: Physician Assistant

## 2023-03-07 ENCOUNTER — Other Ambulatory Visit: Payer: Self-pay | Admitting: Cardiovascular Disease

## 2023-03-12 ENCOUNTER — Other Ambulatory Visit: Payer: Self-pay | Admitting: Adult Health

## 2023-03-15 ENCOUNTER — Ambulatory Visit (HOSPITAL_COMMUNITY): Payer: Medicare PPO | Attending: Cardiovascular Disease

## 2023-03-15 DIAGNOSIS — Z952 Presence of prosthetic heart valve: Secondary | ICD-10-CM | POA: Diagnosis not present

## 2023-03-15 DIAGNOSIS — I5032 Chronic diastolic (congestive) heart failure: Secondary | ICD-10-CM | POA: Diagnosis not present

## 2023-03-15 DIAGNOSIS — I7121 Aneurysm of the ascending aorta, without rupture: Secondary | ICD-10-CM | POA: Diagnosis not present

## 2023-03-15 LAB — ECHOCARDIOGRAM COMPLETE
AR max vel: 2.06 cm2
AV Area VTI: 2.12 cm2
AV Area mean vel: 1.93 cm2
AV Mean grad: 6 mmHg
AV Peak grad: 12.3 mmHg
Ao pk vel: 1.75 m/s
Area-P 1/2: 4.65 cm2
P 1/2 time: 393 msec
S' Lateral: 4.3 cm

## 2023-03-16 ENCOUNTER — Other Ambulatory Visit (HOSPITAL_COMMUNITY): Payer: Self-pay

## 2023-03-21 ENCOUNTER — Other Ambulatory Visit: Payer: Self-pay

## 2023-03-23 ENCOUNTER — Encounter (INDEPENDENT_AMBULATORY_CARE_PROVIDER_SITE_OTHER): Payer: Self-pay

## 2023-03-23 DIAGNOSIS — E118 Type 2 diabetes mellitus with unspecified complications: Secondary | ICD-10-CM | POA: Diagnosis not present

## 2023-03-30 ENCOUNTER — Other Ambulatory Visit: Payer: Self-pay | Admitting: Adult Health

## 2023-03-30 DIAGNOSIS — E118 Type 2 diabetes mellitus with unspecified complications: Secondary | ICD-10-CM | POA: Diagnosis not present

## 2023-03-30 DIAGNOSIS — E039 Hypothyroidism, unspecified: Secondary | ICD-10-CM

## 2023-03-30 NOTE — Telephone Encounter (Signed)
Needs tsh. Pt coming in soon

## 2023-04-01 ENCOUNTER — Encounter: Payer: Self-pay | Admitting: Hematology

## 2023-04-01 ENCOUNTER — Encounter (HOSPITAL_COMMUNITY): Payer: Self-pay | Admitting: Internal Medicine

## 2023-04-04 ENCOUNTER — Other Ambulatory Visit: Payer: Self-pay | Admitting: Adult Health

## 2023-04-04 ENCOUNTER — Ambulatory Visit: Payer: Medicare PPO | Admitting: Cardiovascular Disease

## 2023-04-08 ENCOUNTER — Other Ambulatory Visit: Payer: Self-pay

## 2023-04-08 ENCOUNTER — Encounter: Payer: Self-pay | Admitting: Adult Health

## 2023-04-08 ENCOUNTER — Ambulatory Visit: Payer: Medicare HMO | Admitting: Adult Health

## 2023-04-08 VITALS — BP 132/66 | HR 60 | Temp 98.8°F | Ht 67.0 in | Wt 200.2 lb

## 2023-04-08 DIAGNOSIS — E119 Type 2 diabetes mellitus without complications: Secondary | ICD-10-CM

## 2023-04-08 DIAGNOSIS — I2510 Atherosclerotic heart disease of native coronary artery without angina pectoris: Secondary | ICD-10-CM

## 2023-04-08 DIAGNOSIS — Z7984 Long term (current) use of oral hypoglycemic drugs: Secondary | ICD-10-CM | POA: Diagnosis not present

## 2023-04-08 DIAGNOSIS — E02 Subclinical iodine-deficiency hypothyroidism: Secondary | ICD-10-CM

## 2023-04-08 DIAGNOSIS — E782 Mixed hyperlipidemia: Secondary | ICD-10-CM

## 2023-04-08 DIAGNOSIS — I10 Essential (primary) hypertension: Secondary | ICD-10-CM

## 2023-04-08 DIAGNOSIS — C61 Malignant neoplasm of prostate: Secondary | ICD-10-CM | POA: Diagnosis not present

## 2023-04-08 DIAGNOSIS — I5032 Chronic diastolic (congestive) heart failure: Secondary | ICD-10-CM | POA: Diagnosis not present

## 2023-04-08 DIAGNOSIS — C779 Secondary and unspecified malignant neoplasm of lymph node, unspecified: Secondary | ICD-10-CM | POA: Diagnosis not present

## 2023-04-08 DIAGNOSIS — Z0000 Encounter for general adult medical examination without abnormal findings: Secondary | ICD-10-CM | POA: Diagnosis not present

## 2023-04-08 LAB — CBC
HCT: 35.5 % — ABNORMAL LOW (ref 39.0–52.0)
Hemoglobin: 11.5 g/dL — ABNORMAL LOW (ref 13.0–17.0)
MCHC: 32.3 g/dL (ref 30.0–36.0)
MCV: 88 fl (ref 78.0–100.0)
Platelets: 175 10*3/uL (ref 150.0–400.0)
RBC: 4.04 Mil/uL — ABNORMAL LOW (ref 4.22–5.81)
RDW: 14.1 % (ref 11.5–15.5)
WBC: 6.7 10*3/uL (ref 4.0–10.5)

## 2023-04-08 LAB — PSA: PSA: 0 ng/mL — ABNORMAL LOW (ref 0.10–4.00)

## 2023-04-08 LAB — LIPID PANEL
Cholesterol: 169 mg/dL (ref 0–200)
HDL: 56.7 mg/dL (ref >39.00)
LDL Cholesterol: 88 mg/dL (ref 0–99)
NonHDL: 112.16
Total CHOL/HDL Ratio: 3
Triglycerides: 119 mg/dL (ref 0.0–149.0)
VLDL: 23.8 mg/dL (ref 0.0–40.0)

## 2023-04-08 LAB — COMPREHENSIVE METABOLIC PANEL
ALT: 20 U/L (ref 0–53)
AST: 19 U/L (ref 0–37)
Albumin: 4.1 g/dL (ref 3.5–5.2)
Alkaline Phosphatase: 57 U/L (ref 39–117)
BUN: 33 mg/dL — ABNORMAL HIGH (ref 6–23)
CO2: 27 mEq/L (ref 19–32)
Calcium: 9.4 mg/dL (ref 8.4–10.5)
Chloride: 98 mEq/L (ref 96–112)
Creatinine, Ser: 1.33 mg/dL (ref 0.40–1.50)
GFR: 52.24 mL/min — ABNORMAL LOW (ref >60.00)
Glucose, Bld: 105 mg/dL — ABNORMAL HIGH (ref 70–99)
Potassium: 4.4 mEq/L (ref 3.5–5.1)
Sodium: 133 mEq/L — ABNORMAL LOW (ref 135–145)
Total Bilirubin: 0.6 mg/dL (ref 0.2–1.2)
Total Protein: 6 g/dL (ref 6.0–8.3)

## 2023-04-08 LAB — HEMOGLOBIN A1C: Hgb A1c MFr Bld: 6.4 % (ref 4.6–6.5)

## 2023-04-08 LAB — TSH: TSH: 0.97 u[IU]/mL (ref 0.35–5.50)

## 2023-04-08 MED ORDER — BD SWAB SINGLE USE REGULAR PADS
100.0000 | MEDICATED_PAD | 2 refills | Status: DC
Start: 2023-04-08 — End: 2023-07-01

## 2023-04-08 MED ORDER — TRUE METRIX LEVEL 1 LOW VI SOLN
1.0000 | 0 refills | Status: DC
Start: 2023-04-08 — End: 2023-09-27

## 2023-04-08 MED ORDER — CALCIUM CARBONATE 1500 (600 CA) MG PO TABS
1200.0000 mg | ORAL_TABLET | Freq: Every day | ORAL | 0 refills | Status: AC
Start: 2023-04-08 — End: ?

## 2023-04-08 NOTE — Progress Notes (Signed)
Subjective:    Patient ID: Matthew Serrano., male    DOB: 1946/05/20, 77 y.o.   MRN: [id number]  HPI  Patient presents for yearly preventative medicine examination. 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  DM Type II -maintained on glipizide 10 mg ER twice daily and metformin 1000 mg twice daily.  He does check his blood sugars twice a day with fasting readings in the 80s to 110s and postprandial in the 140s to 180s. He has not been walking much but plans to get back into it.  Lab Results  Component Value Date   HGBA1C 6.2 (A) 01/06/2023   Hypertension-prescribed doxazosin 8 mg daily,  Labetalol  600 mg in the morning and 400 mg in the evening as well as torsemide 20 mg twice daily and Spirolactone 25 mg daily.  He denies dizziness, lightheadedness, chest pain, or shortness of breath.  He does check his blood sugars at home with readings in the 120s to 130s over 70s. BP Readings from Last 3 Encounters:  04/08/23 132/66  01/06/23 138/70  12/16/22 126/65   Hypothyroidism -managed with Synthroid 125 mcg daily.  Hyperlipidemia/CAD - managed with lipitor 80 mg daily.  Lab Results  Component Value Date   CHOL 133 07/30/2021   HDL 62.00 07/30/2021   LDLCALC 56 07/30/2021   LDLDIRECT 105.0 04/28/2016   TRIG 75.0 07/30/2021   CHOLHDL 2 07/30/2021    Heart Failure with preserved ejection fraction -cardiology with torsemide 20 mg daily and  spironolactone 25 mg daily.  He denies dizziness, lightheadedness, chest pain, shortness of breath,. He does have some mild lower extremity edema L>R  Metastatic prostate Cancer-diagnosed in 2019 currently stage iV with node metastasis.  He is undergoing treatment PSA has been undetectable since treatment started  All immunizations and health maintenance protocols were reviewed with the patient and needed orders were placed.  Appropriate screening laboratory values were ordered for the patient including screening of hyperlipidemia, renal function and hepatic function. If indicated by BPH, a PSA was ordered.  Medication reconciliation,  past medical history, social history, problem list and allergies were reviewed in detail with the patient  Goals were established with regard to weight loss, exercise, and  diet in compliance with medications Wt Readings from Last 3 Encounters:  04/08/23 200 lb 3.2 oz (90.8 kg)  01/06/23 197 lb (89.4 kg)  12/16/22 196 lb 1.6 oz (89 kg)    Review of Systems  Constitutional: Negative.   HENT: Negative.    Eyes: Negative.   Respiratory: Negative.    Cardiovascular:  Positive for leg swelling.  Gastrointestinal: Negative.   Endocrine: Negative.   Genitourinary: Negative.   Musculoskeletal: Negative.   Skin: Negative.   Allergic/Immunologic: Negative.   Neurological: Negative.   Hematological: Negative.   Psychiatric/Behavioral: Negative.    All other systems reviewed and are negative.  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) 05/14/2020   History of nuclear stress test    Myoview 8/18: EF 64, inferobasal thinning, no ischemia, low risk   HYPERLIPIDEMIA 07/06/2007    HYPERTENSION 07/06/2007   HYPOTHYROIDISM 07/06/2007   Leg pain    ABIs 8/18:  Normal    Obesity    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   23 mm human aortic root graft with 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 and reimplantation of left main and right coronary arteries   Severe aortic insufficiency 05/13/2020   acute onset in setting of bacterial endocarditis   Severe aortic stenosis     Social History   Socioeconomic History   Marital status: Widowed    Spouse name: Not on file   Number of children: 0   Years of education: Not on file   Highest education level: Not on file  Occupational History   Not on file  Tobacco Use   Smoking status: Former   Smokeless tobacco: Never   Tobacco comments:    quit 2005  Vaping Use   Vaping status: Never Used  Substance and Sexual Activity   Alcohol use: Not Currently    Alcohol/week: 14.0 standard drinks of alcohol    Types: 14 Cans of beer per week    Comment: stopped in 07/2022   Drug use: No   Sexual activity: Not on file  Other Topics Concern   Not on file  Social History Narrative   He works as a Estate agent    Not married    No kids          Social Determinants of Health   Financial Resource Strain: Low Risk  (11/10/2022)   Overall Financial Resource Strain (CARDIA)    Difficulty of Paying Living Expenses: Not hard at all  Food Insecurity: No Food Insecurity (01/03/2023)   Hunger Vital Sign    Worried About Running Out of Food in the Last Year: Never true    Ran Out of Food in the Last Year: Never true  Transportation Needs: No Transportation Needs (11/10/2022)   PRAPARE - Administrator, Civil Service (Medical): No    Lack of Transportation (Non-Medical): No  Physical Activity: Inactive (04/08/2023)   Exercise Vital Sign    Days of Exercise per Week: 0 days    Minutes of Exercise per Session: 0 min  Stress: No Stress Concern Present (11/10/2022)   [employer] of  Occupational Health - Occupational Stress Questionnaire    Feeling of Stress : Not at all  Social Connections: Socially Isolated (11/10/2022)   Social Connection and Isolation Panel [NHANES]    Frequency of Communication with Friends and Family: More than three times a week    Frequency of Social Gatherings with Friends and Family: More than three times a week    Attends Religious Services: Never    Database administrator or Organizations: No    Attends Banker Meetings: Never    Marital Status: Widowed  Intimate Partner Violence: Not At Risk (11/10/2022)   Humiliation, Afraid, Rape, and Kick questionnaire    Fear of Current or Ex-Partner: No    Emotionally Abused: No    Physically Abused: No    Sexually Abused: No    Past Surgical History:  Procedure Laterality Date   AORTIC VALVE REPLACEMENT N/A 05/15/2020   Procedure:  AORTIC VALVE REPLACEMENT (AVR) SIZE 23 MM,  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;  Surgeon: Purcell Nails, MD;  [facility];  Service: Open Heart Surgery;  Laterality: N/A;   ASCENDING AORTIC ROOT REPLACEMENT N/A 05/15/2020   Procedure: HUMAN ALLOGRAFT AORTIC ROOT REPLACEMENT;  Surgeon: Purcell Nails, MD;  Location[facility] OR;  Service: Open Heart Surgery;  Laterality: N/A;   BUBBLE STUDY  03/03/2022   Procedure: BUBBLE STUDY;  Surgeon: Meriam Sprague, MD;  Location: [facility] ENDOSCOPY;  Service: Cardiovascular;;   COLONOSCOPY     COLONOSCOPY N/A 05/03/2020   Procedure: COLONOSCOPY;  Surgeon: Charna Elizabeth, MD;  Location: [facility] ENDOSCOPY;  Service: Endoscopy;  Laterality: N/A;   COLONOSCOPY W/ BIOPSIES AND POLYPECTOMY     ENTEROSCOPY N/A 05/02/2020   Procedure: ENTEROSCOPY;  Surgeon: Meridee Score Netty Starring., MD;  Location: [facility] ENDOSCOPY;  Service: Gastroenterology;  Laterality: N/A;   HEMOSTASIS CLIP PLACEMENT  05/03/2020   Procedure: HEMOSTASIS CLIP PLACEMENT;  Surgeon: Charna Elizabeth, MD;  Location: [facility] ENDOSCOPY;  Service: Endoscopy;;   HOT  HEMOSTASIS N/A 05/03/2020   Procedure: HOT HEMOSTASIS (ARGON PLASMA COAGULATION/BICAP);  Surgeon: Charna Elizabeth, MD;  Location: [facility] ENDOSCOPY;  Service: Endoscopy;  Laterality: N/A;   IR IMAGING GUIDED PORT INSERTION  07/05/2018   POLYPECTOMY     PORT-A-CATH REMOVAL Right 05/20/2020   Procedure: REMOVAL PORT-A-CATH;  Surgeon: Purcell Nails, MD;  Location: [facility] OR;  Service: Thoracic;  Laterality: Right;   RIGHT/LEFT HEART CATH AND CORONARY ANGIOGRAPHY N/A 02/13/2020   Procedure: RIGHT/LEFT HEART CATH AND CORONARY ANGIOGRAPHY;  Surgeon: Lyn Records, MD;  Location: [facility] INVASIVE CV LAB;  Service: Cardiovascular;  Laterality: N/A;   SUBMUCOSAL INJECTION  05/02/2020   Procedure: SUBMUCOSAL INJECTION;  Surgeon: Meridee Score Netty Starring., MD;  Location: [facility] ENDOSCOPY;  Service: Gastroenterology;;   TEE WITHOUT CARDIOVERSION N/A 05/14/2020   Procedure: TRANSESOPHAGEAL ECHOCARDIOGRAM (TEE);  Surgeon: Thurmon Fair, MD;  Location: [facility] ENDOSCOPY;  Service: Cardiovascular;  Laterality: N/A;   TEE WITHOUT CARDIOVERSION N/A 05/15/2020   Procedure: TRANSESOPHAGEAL ECHOCARDIOGRAM (TEE);  Surgeon: Purcell Nails, MD;  Location: [facility] OR;  Service: Open Heart Surgery;  Laterality: N/A;   TEE WITHOUT CARDIOVERSION N/A 03/03/2022   Procedure: TRANSESOPHAGEAL ECHOCARDIOGRAM (TEE);  Surgeon: Meriam Sprague, MD;  Location: [facility] ENDOSCOPY;  Service: Cardiovascular;  Laterality: N/A;    Family History  Problem Relation Age of Onset   Aneurysm Mother        Brain aneurysm    Breast cancer Sister    Prostate cancer Brother    Hypertension Other    Breast cancer Other        family history   Colon cancer Neg Hx    Esophageal cancer Neg Hx    Stomach cancer Neg Hx    Rectal cancer Neg Hx     No Known Allergies  Current Outpatient Medications on File Prior to Visit  Medication Sig Dispense Refill   abiraterone acetate (ZYTIGA) 250 MG tablet Take 4 tablets (1,000 mg total) by mouth daily. Take on an empty stomach 1  hour before or 2 hours after a meal 120 tablet 2   aspirin EC 81 MG tablet Take 81 mg by mouth daily. Swallow whole.     atorvastatin (LIPITOR) 80 MG tablet TAKE 1 TABLET EVERY DAY 90 tablet 3   Calcium Carbonate (CALCIUM 600 PO) Take 1,200 mg by mouth daily.     cholecalciferol (VITAMIN D3) 25 MCG (1000 UT) tablet Take 1,000 Units by  mouth daily.     doxazosin (CARDURA) 8 MG tablet TAKE 1 TABLET AT BEDTIME 90 tablet 3   ferrous sulfate 325 (65 FE) MG EC tablet Take 1 tablet (325 mg total) by mouth 2 (two) times daily before a meal. 60 tablet 2   glipiZIDE (GLUCOTROL XL) 10 MG 24 hr tablet TAKE 1 TABLET TWICE DAILY 180 tablet 3   labetalol (NORMODYNE) 200 MG tablet TAKE 3 TABLETS EVERY MORNING AND TAKE 2 TABLETS EVERY EVENING 300 tablet 1   levothyroxine (SYNTHROID) 125 MCG tablet TAKE 1 TABLET EVERY DAY 90 tablet 3   lisinopril (ZESTRIL) 2.5 MG tablet Take 1 tablet (2.5 mg total) by mouth daily. 90 tablet 3   metFORMIN (GLUCOPHAGE) 1000 MG tablet TAKE 1 TABLET TWICE DAILY WITH MEALS 180 tablet 0   omeprazole (PRILOSEC) 40 MG capsule TAKE 1 CAPSULE EVERY DAY 90 capsule 3   potassium chloride SA (KLOR-CON M) 20 MEQ tablet Take 1 tablet (20 mEq total) by mouth daily. 90 tablet 3   predniSONE (DELTASONE) 5 MG tablet Take 1 tablet (5 mg total) by mouth daily with breakfast. 90 tablet 3   spironolactone (ALDACTONE) 25 MG tablet Take 1 tablet (25 mg total) by mouth every other day. 45 tablet 2   torsemide (DEMADEX) 20 MG tablet TAKE 1 TABLET EVERY DAY 90 tablet 2   No current facility-administered medications on file prior to visit.    BP 132/66 (BP Location: Left Arm, Patient Position: Sitting, Cuff Size: Large)   Pulse 60   Temp 98.8 F (37.1 C) (Oral)   Ht 5\' 7"  (1.702 m)   Wt 200 lb 3.2 oz (90.8 kg)   SpO2 100%   BMI 31.36 kg/m       Objective:   Physical Exam Vitals and nursing note reviewed.  Constitutional:      General: He is not in acute distress.    Appearance: Normal  appearance. He is not ill-appearing.  HENT:     Head: Normocephalic and atraumatic.     Right Ear: Tympanic membrane, ear canal and external ear normal. There is no impacted cerumen.     Left Ear: Tympanic membrane, ear canal and external ear normal. There is no impacted cerumen.     Nose: Nose normal. No congestion or rhinorrhea.     Mouth/Throat:     Mouth: Mucous membranes are moist.     Pharynx: Oropharynx is clear.  Eyes:     Extraocular Movements: Extraocular movements intact.     Conjunctiva/sclera: Conjunctivae normal.     Pupils: Pupils are equal, round, and reactive to light.  Neck:     Vascular: No carotid bruit.  Cardiovascular:     Rate and Rhythm: Normal rate and regular rhythm.     Pulses: Normal pulses.     Heart sounds: No murmur heard.    No friction rub. No gallop.  Pulmonary:     Effort: Pulmonary effort is normal.     Breath sounds: Normal breath sounds.  Abdominal:     General: Abdomen is flat. Bowel sounds are normal. There is no distension.     Palpations: Abdomen is soft. There is no mass.     Tenderness: There is no abdominal tenderness. There is no guarding or rebound.     Hernia: No hernia is present.  Musculoskeletal:        General: Normal range of motion.     Cervical back: Normal range of motion and neck supple.  Right lower leg: Edema present.     Left lower leg: Edema present.  Lymphadenopathy:     Cervical: No cervical adenopathy.  Skin:    General: Skin is warm and dry.     Capillary Refill: Capillary refill takes less than 2 seconds.  Neurological:     General: No focal deficit present.     Mental Status: He is alert and oriented to person, place, and time.  Psychiatric:        Mood and Affect: Mood normal.        Behavior: Behavior normal.        Thought Content: Thought content normal.        Judgment: Judgment normal.        Assessment & Plan:  1. Routine general medical examination at a health care facility Today patient  counseled on age appropriate routine health concerns for screening and prevention, each reviewed and up to date or declined. Immunizations reviewed and up to date or declined. Labs ordered and reviewed. Risk factors for depression reviewed and negative. Hearing function and visual acuity are intact. ADLs screened and addressed as needed. Functional ability and level of safety reviewed and appropriate. Education, counseling and referrals performed based on assessed risks today. Patient provided with a copy of personalized plan for preventive services. - encouraged walking - Follow up in one year or sooner if needed  2. Diabetes mellitus treated with oral medication (HCC) - Consider dose change of Metformin  - Follow up in 3 or 6 months depending on A1c  - Lipid panel; Future - TSH; Future - CBC; Future - Comprehensive metabolic panel; Future - Hemoglobin A1c; Future - Blood Glucose Calibration (TRUE METRIX LEVEL 1) Low SOLN; 1 each by Other route as directed.  Dispense: 1 each; Refill: 0 - Alcohol Swabs (B-D SINGLE USE SWABS REGULAR) PADS; 100 each by Does not apply route as directed.  Dispense: 100 each; Refill: 2 - Ambulatory referral to Ophthalmology  3. Essential hypertension - Controlled. No change in medication  - Lipid panel; Future - TSH; Future - CBC; Future - Comprehensive metabolic panel; Future  4. Coronary artery disease involving native coronary artery of native heart without angina pectoris -Per cardiology  - Lipid panel; Future - TSH; Future - CBC; Future - Comprehensive metabolic panel; Future  5. Chronic heart failure with preserved ejection fraction [facility]) - Per Cardiology  - Lipid panel; Future - TSH; Future - CBC; Future - Comprehensive metabolic panel; Future  6. Mixed hyperlipidemia - Continue statin  - Lipid panel; Future - TSH; Future - CBC; Future - Comprehensive metabolic panel; Future - calcium carbonate (CALCIUM 600) 1500 (600 Ca) MG TABS tablet;  Take 0.8 tablets (1,200 mg total) by mouth daily.  Dispense: 30 tablet; Refill: 0  7. Subclinical iodine-deficiency hypothyroidism - Consider dose change of synthroid  - Lipid panel; Future - TSH; Future - CBC; Future - Comprehensive metabolic panel; Future  8. Prostate cancer metastatic to multiple sites [facility]) - Per oncology - PSA; Future  Shirline Frees, NP

## 2023-04-08 NOTE — Patient Instructions (Signed)
It was great seeing you today   We will follow up with you regarding your lab work   Please let me know if you need anything   An eye doctor will call you and get you scheduled

## 2023-04-11 ENCOUNTER — Ambulatory Visit: Payer: Medicare HMO | Attending: Cardiovascular Disease | Admitting: Cardiovascular Disease

## 2023-04-11 ENCOUNTER — Encounter: Payer: Self-pay | Admitting: Cardiovascular Disease

## 2023-04-11 VITALS — BP 120/82 | HR 55 | Ht 67.0 in | Wt 200.4 lb

## 2023-04-11 DIAGNOSIS — I10 Essential (primary) hypertension: Secondary | ICD-10-CM

## 2023-04-11 DIAGNOSIS — I5032 Chronic diastolic (congestive) heart failure: Secondary | ICD-10-CM | POA: Diagnosis not present

## 2023-04-11 DIAGNOSIS — Q2543 Congenital aneurysm of aorta: Secondary | ICD-10-CM

## 2023-04-11 NOTE — Patient Instructions (Addendum)
Medication Instructions:  Your physician recommends that you continue on your current medications as directed. Please refer to the Current Medication list given to you today.  *If you need a refill on your cardiac medications before your next appointment, please call your pharmacy*  Lab Work: NONE If you have labs (blood work) drawn today and your tests are completely normal, you will receive your results only by: MyChart Message (if you have MyChart) OR A paper copy in the mail If you have any lab test that is abnormal or we need to change your treatment, we will call you to review the results.  Testing/Procedures: Coronary CT Angiogram Your physician has requested that you have cardiac CT. Cardiac computed tomography (CT) is a painless test that uses an x-ray machine to take clear, detailed pictures of your heart. For further information please visit [email or web address] Please follow instruction sheet as given.  ECHO (to be done in 1 year) Your physician has requested that you have an echocardiogram. Echocardiography is a painless test that uses sound waves to create images of your heart. It provides your doctor with information about the size and shape of your heart and how well your heart's chambers and valves are working. This procedure takes approximately one hour. There are no restrictions for this procedure. Please do NOT wear cologne, perfume, aftershave, or lotions (deodorant is allowed). Please arrive 15 minutes prior to your appointment time.  Follow-Up: At [facility], you and your health needs are our priority.  As part of our continuing mission to provide you with exceptional heart care, we have created designated Provider Care Teams.  These Care Teams include your primary Cardiologist (physician) and Advanced Practice Providers (APPs -  Physician Assistants and Nurse Practitioners) who all work together to provide you with the care you need, when you need it.  Your  next appointment:   6 month(s) AFTER CTA  Provider:   Tonny Bollman, MD  or APP    Other Instructions   Your cardiac CT will be scheduled at:   [facility] [address] [place], [place] [address] [phone]  Please arrive at the [facility] and Children's Entrance (Entrance C2) of [facility] 30 minutes prior to test start time. You can use the FREE valet parking offered at entrance C (encouraged to control the heart rate for the test)  Proceed to the [facility] Radiology Department (first floor) to check-in and test prep.  All radiology patients and guests should use entrance C2 at [facility], accessed from [facility], even though the hospital's physical address listed is [address].     Please follow these instructions carefully (unless otherwise directed):  An IV will be required for this test and Nitroglycerin will be given.  Hold all erectile dysfunction medications at least 3 days (72 hrs) prior to test. (Ie viagra, cialis, sildenafil, tadalafil, etc)   On the Night Before the Test: Be sure to Drink plenty of water. Do not consume any caffeinated/decaffeinated beverages or chocolate 12 hours prior to your test. Do not take any antihistamines 12 hours prior to your test.  On the Day of the Test: Drink plenty of water until 1 hour prior to the test. Do not eat any food 1 hour prior to test. You may take your regular medications prior to the test.  Take metoprolol (Lopressor) two hours prior to test. Torsemide and Spironolactone, please HOLD on the morning of the test.  After the Test: Drink plenty of water. After receiving IV contrast, you may experience a mild flushed feeling. This is normal. On occasion, you may experience a mild rash up to 24 hours after the test. This is not dangerous. If this occurs, you can take Benadryl 25 mg and increase your fluid intake. If you experience trouble breathing, this  can be serious. If it is severe call 911 IMMEDIATELY. If it is mild, please call our office. If you take: Glipizide/Metformin, Avandament, Glucavance, please do not take 48 hours after completing test unless otherwise instructed.  We will call to schedule your test 2-4 weeks out understanding that some insurance companies will need an authorization prior to the service being performed.   For more information and frequently asked questions, please visit our website : [email or web address]  For non-scheduling related questions, please contact the cardiac imaging nurse navigator should you have any questions/concerns: Cardiac Imaging Nurse Navigators Direct Office Dial: [phone]   For scheduling needs, including cancellations and rescheduling, please call [place], [phone].

## 2023-04-11 NOTE — Progress Notes (Signed)
Cardiology Office Note:    Date:  04/11/2023   ID:  Matthew Serrano., DOB 16-Dec-1945, MRN [id number]  PCP:  Shirline Frees, NP    [facility] Cardiologist:  Tonny Bollman, MD Cardiology APP:  Kennon Rounds     Referring MD: Shirline Frees, NP   Chief Complaint  Patient presents with   Follow-up    Aortic valve disease    History of Present Illness:    Matthew Serrano. is a 77 y.o. 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  He reports stable symptoms.  He describes exertional dyspnea with 2 flights of stairs.  He has no chest pain or pressure.  No lightheadedness, orthopnea, PND, or syncope.  No heart palpitations.  He does have mild left ankle swelling.  Reports no changes in his medications.  He received doxazosin from his pharmacy through the mail.  States he has not taken this in the past year and wonders if he should be taking it.  Reports good blood pressure control at home.  Compliant with his medications for hypertension and diabetes.  Denies fevers, chills, or other complaints at present.  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   23 mm human aortic root graft with 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 and reimplantation of left main and right coronary arteries   Severe aortic insufficiency 05/13/2020   acute onset in setting of bacterial endocarditis   Severe aortic stenosis     Past Surgical History:  Procedure Laterality Date   AORTIC VALVE REPLACEMENT N/A 05/15/2020   Procedure: AORTIC VALVE REPLACEMENT (AVR) SIZE 23 MM,  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;  Surgeon: Purcell Nails, MD;  Location: [facility] OR;  Service: Open Heart Surgery;  Laterality: N/A;   ASCENDING AORTIC ROOT REPLACEMENT N/A 05/15/2020   Procedure: HUMAN ALLOGRAFT AORTIC ROOT REPLACEMENT;  Surgeon: Purcell Nails, MD;  Location: [facility] OR;  Service: Open Heart Surgery;  Laterality: N/A;   BUBBLE STUDY  03/03/2022   Procedure: BUBBLE STUDY;  Surgeon: Meriam Sprague, MD;  Location: [facility] ENDOSCOPY;  Service: Cardiovascular;;  COLONOSCOPY     COLONOSCOPY N/A 05/03/2020   Procedure: COLONOSCOPY;  Surgeon: Charna Elizabeth, MD;  Location: [facility] ENDOSCOPY;  Service: Endoscopy;  Laterality: N/A;   COLONOSCOPY W/ BIOPSIES AND POLYPECTOMY     ENTEROSCOPY N/A 05/02/2020   Procedure: ENTEROSCOPY;  Surgeon: Meridee Score Netty Starring., MD;  Location: [facility] ENDOSCOPY;  Service: Gastroenterology;  Laterality: N/A;   HEMOSTASIS CLIP PLACEMENT  05/03/2020   Procedure: HEMOSTASIS CLIP PLACEMENT;  Surgeon: Charna Elizabeth, MD;  Location: [facility]  ENDOSCOPY;  Service: Endoscopy;;   HOT HEMOSTASIS N/A 05/03/2020   Procedure: HOT HEMOSTASIS (ARGON PLASMA COAGULATION/BICAP);  Surgeon: Charna Elizabeth, MD;  Location: [facility] ENDOSCOPY;  Service: Endoscopy;  Laterality: N/A;   IR IMAGING GUIDED PORT INSERTION  07/05/2018   POLYPECTOMY     PORT-A-CATH REMOVAL Right 05/20/2020   Procedure: REMOVAL PORT-A-CATH;  Surgeon: Purcell Nails, MD;  Location: [facility] OR;  Service: Thoracic;  Laterality: Right;   RIGHT/LEFT HEART CATH AND CORONARY ANGIOGRAPHY N/A 02/13/2020   Procedure: RIGHT/LEFT HEART CATH AND CORONARY ANGIOGRAPHY;  Surgeon: Lyn Records, MD;  Location: [facility] INVASIVE CV LAB;  Service: Cardiovascular;  Laterality: N/A;   SUBMUCOSAL INJECTION  05/02/2020   Procedure: SUBMUCOSAL INJECTION;  Surgeon: Meridee Score Netty Starring., MD;  Location: [facility] ENDOSCOPY;  Service: Gastroenterology;;   TEE WITHOUT CARDIOVERSION N/A 05/14/2020   Procedure: TRANSESOPHAGEAL ECHOCARDIOGRAM (TEE);  Surgeon: Thurmon Fair, MD;  Location: [facility] ENDOSCOPY;  Service: Cardiovascular;  Laterality: N/A;   TEE WITHOUT CARDIOVERSION N/A 05/15/2020   Procedure: TRANSESOPHAGEAL ECHOCARDIOGRAM (TEE);  Surgeon: Purcell Nails, MD;  Location: [facility] OR;  Service: Open Heart Surgery;  Laterality: N/A;   TEE WITHOUT CARDIOVERSION N/A 03/03/2022   Procedure: TRANSESOPHAGEAL ECHOCARDIOGRAM (TEE);  Surgeon: Meriam Sprague, MD;  Location: [facility] ENDOSCOPY;  Service: Cardiovascular;  Laterality: N/A;    Current Medications: Current Meds  Medication Sig   abiraterone acetate (ZYTIGA) 250 MG tablet Take 4 tablets (1,000 mg total) by mouth daily. Take on an empty stomach 1 hour before or 2 hours after a meal   Alcohol Swabs (B-D SINGLE USE SWABS REGULAR) PADS 100 each by Does not apply route as directed.   aspirin EC 81 MG tablet Take 81 mg by mouth daily. Swallow whole.   atorvastatin (LIPITOR) 80 MG tablet TAKE 1 TABLET EVERY DAY   Blood Glucose Calibration (TRUE METRIX LEVEL 1) Low SOLN 1 each by  Other route as directed.   calcium carbonate (CALCIUM 600) 1500 (600 Ca) MG TABS tablet Take 0.8 tablets (1,200 mg total) by mouth daily.   cholecalciferol (VITAMIN D3) 25 MCG (1000 UT) tablet Take 1,000 Units by mouth daily.   doxazosin (CARDURA) 8 MG tablet TAKE 1 TABLET AT BEDTIME   ferrous sulfate 325 (65 FE) MG EC tablet Take 1 tablet (325 mg total) by mouth 2 (two) times daily before a meal.   glipiZIDE (GLUCOTROL XL) 10 MG 24 hr tablet TAKE 1 TABLET TWICE DAILY   labetalol (NORMODYNE) 200 MG tablet TAKE 3 TABLETS EVERY MORNING AND TAKE 2 TABLETS EVERY EVENING   levothyroxine (SYNTHROID) 125 MCG tablet TAKE 1 TABLET EVERY DAY   lisinopril (ZESTRIL) 2.5 MG tablet Take 1 tablet (2.5 mg total) by mouth daily.   metFORMIN (GLUCOPHAGE) 1000 MG tablet TAKE 1 TABLET TWICE DAILY WITH MEALS   omeprazole (PRILOSEC) 40 MG capsule TAKE 1 CAPSULE EVERY DAY   potassium chloride SA (KLOR-CON M) 20 MEQ tablet Take 1 tablet (20 mEq total) by mouth daily.   predniSONE (DELTASONE) 5 MG tablet Take  1 tablet (5 mg total) by mouth daily with breakfast.   spironolactone (ALDACTONE) 25 MG tablet Take 1 tablet (25 mg total) by mouth every other day.   torsemide (DEMADEX) 20 MG tablet TAKE 1 TABLET EVERY DAY     Allergies:   Patient has no known allergies.   Social History   Socioeconomic History   Marital status: Widowed    Spouse name: Not on file   Number of children: 0   Years of education: Not on file   Highest education level: Not on file  Occupational History   Not on file  Tobacco Use   Smoking status: Former   Smokeless tobacco: Never   Tobacco comments:    quit 2005  Vaping Use   Vaping status: Never Used  Substance and Sexual Activity   Alcohol use: Not Currently    Alcohol/week: 14.0 standard drinks of alcohol    Types: 14 Cans of beer per week    Comment: stopped in 07/2022   Drug use: No   Sexual activity: Not on file  Other Topics Concern   Not on file  Social History  Narrative   He works as a Estate agent    Not married    No kids          Social Determinants of Health   Financial Resource Strain: Low Risk  (11/10/2022)   Overall Financial Resource Strain (CARDIA)    Difficulty of Paying Living Expenses: Not hard at all  Food Insecurity: No Food Insecurity (01/03/2023)   Hunger Vital Sign    Worried About Running Out of Food in the Last Year: Never true    Ran Out of Food in the Last Year: Never true  Transportation Needs: No Transportation Needs (11/10/2022)   PRAPARE - Administrator, Civil Service (Medical): No    Lack of Transportation (Non-Medical): No  Physical Activity: Inactive (04/08/2023)   Exercise Vital Sign    Days of Exercise per Week: 0 days    Minutes of Exercise per Session: 0 min  Stress: No Stress Concern Present (11/10/2022)   [employer] of Occupational Health - Occupational Stress Questionnaire    Feeling of Stress : Not at all  Social Connections: Socially Isolated (11/10/2022)   Social Connection and Isolation Panel [NHANES]    Frequency of Communication with Friends and Family: More than three times a week    Frequency of Social Gatherings with Friends and Family: More than three times a week    Attends Religious Services: Never    Database administrator or Organizations: No    Attends Banker Meetings: Never    Marital Status: Widowed     Family History: The patient's family history includes Aneurysm in his mother; Breast cancer in his sister and another family member; Hypertension in an other family member; Prostate cancer in his brother. There is no history of Colon cancer, Esophageal cancer, Stomach cancer, or Rectal cancer.  ROS:   Please see the history of present illness.    All other systems reviewed and are negative.  EKGs/Labs/Other Studies Reviewed:    The following studies were reviewed today: Echo 03/15/23:  1. Left ventricular ejection fraction, by estimation, is 60  to 65%. The  left ventricle has normal function. The left ventricle has no regional  wall motion abnormalities. There is mild asymmetric left ventricular  hypertrophy of the basal-septal segment.  Left ventricular diastolic parameters were normal.   2. Right ventricular  systolic function is normal. The right ventricular  size is normal. There is normal pulmonary artery systolic pressure. The  estimated right ventricular systolic pressure is 25.8 mmHg.   3. Left atrial size was moderately dilated.   4. The mitral valve is normal in structure. Mild mitral valve  regurgitation. No evidence of mitral stenosis.   5. There is a 23 mm Human aortic root graft with repair of aorta to  pulmonary artery fistula and reimplantation of left main and right  coronary arteries valve present in the aortic position. Procedure Date:  05/15/2020. There is a pseudoaneurysm  posterior to the right coronary cusp with a small residual pseudoaneurysm  => PA fistula. Mild aortic insufficiency. Mean gradient 6 mmHg, no  significant allograft valve stenosis. The aortic valve and root look  similar to the prior study.   6. Aortic root/ascending aorta has been repaired/replaced.   7. The inferior vena cava is normal in size with greater than 50%  respiratory variability, suggesting right atrial pressure of 3 mmHg.   EKG Interpretation Date/Time:  Monday April 11 2023 08:51:53 EDT Ventricular Rate:  55 PR Interval:  186 QRS Duration:  90 QT Interval:  464 QTC Calculation: 443 R Axis:   -3  Text Interpretation: Sinus bradycardia with sinus arrhythmia Nonspecific T wave abnormality When compared with ECG of 16-May-2020 07:02, Vent. rate has decreased BY  40 BPM T wave inversion no longer evident in Inferior leads QT has shortened Confirmed by Tonny Bollman [phone]) on 04/11/2023 9:05:16 AM    Recent Labs: 04/08/2023: ALT 20; BUN 33; Creatinine, Ser 1.33; Hemoglobin 11.5; Platelets 175.0; Potassium 4.4; Sodium 133;  TSH 0.97  Recent Lipid Panel    Component Value Date/Time   CHOL 169 04/08/2023 0824   TRIG 119.0 04/08/2023 0824   HDL 56.70 04/08/2023 0824   CHOLHDL 3 04/08/2023 0824   VLDL 23.8 04/08/2023 0824   LDLCALC 88 04/08/2023 0824   LDLCALC 82 07/29/2020 0831   LDLDIRECT 105.0 04/28/2016 0756     Risk Assessment/Calculations:                Physical Exam:    VS:  BP 120/82   Pulse (!) 55   Ht 5\' 7"  (1.702 m)   Wt 200 lb 6.4 oz (90.9 kg)   SpO2 97%   BMI 31.39 kg/m     Wt Readings from Last 3 Encounters:  04/11/23 200 lb 6.4 oz (90.9 kg)  04/08/23 200 lb 3.2 oz (90.8 kg)  01/06/23 197 lb (89.4 kg)     GEN:  Well nourished, well developed in no acute distress HEENT: Normal NECK: No JVD; No carotid bruits LYMPHATICS: No lymphadenopathy CARDIAC: RRR, 3/6 pansystolic murmur left sternal border RESPIRATORY:  Clear to auscultation without rales, wheezing or rhonchi  ABDOMEN: Soft, non-tender, non-distended MUSCULOSKELETAL: Trace right ankle edema and 1+ left ankle edema; No deformity  SKIN: Warm and dry NEUROLOGIC:  Alert and oriented x 3 PSYCHIATRIC:  Normal affect   ASSESSMENT:    1. Essential hypertension   2. Chronic diastolic heart failure (HCC)   3. Sinus of Valsalva aneurysm    PLAN:    In order of problems listed above:  Blood pressure well-controlled on combination of labetalol, lisinopril, and spironolactone.  He received doxazosin but has not taken it in the past year.  I advised him to discontinue this medicine as I am concerned he would develop symptomatic hypotension and/or dizziness as his blood pressure is well-controlled without it. Appears  clinically stable with NYHA functional class II symptoms of exertional dyspnea.  Continue current medical regimen.  Recent echo reviewed as above. Complex situation status post aortic valve replacement in the context of aortic valve endocarditis and abscess.  Now with aortic to PA fistula and pseudoaneurysm.   Complex situation felt to be at extremely high risk of surgical repair and patient has opted for continued surveillance in light of his age and comorbid conditions with metastatic prostate cancer.  Recommend a gated cardiac CTA for surveillance in 6 months prior to follow-up visit with me or my APP.  I will plan on a repeat echocardiogram in 1 year.  Will continue his current medications.      Medication Adjustments/Labs and Tests Ordered: Current medicines are reviewed at length with the patient today.  Concerns regarding medicines are outlined above.  Orders Placed This Encounter  Procedures   EKG 12-Lead   No orders of the defined types were placed in this encounter.   There are no Patient Instructions on file for this visit.   Signed, Tonny Bollman, MD  04/11/2023 9:11 AM    [facility]

## 2023-04-13 ENCOUNTER — Other Ambulatory Visit: Payer: Self-pay

## 2023-04-13 DIAGNOSIS — E118 Type 2 diabetes mellitus with unspecified complications: Secondary | ICD-10-CM | POA: Diagnosis not present

## 2023-04-13 NOTE — Telephone Encounter (Signed)
Pt called and stated that he is no longer taking this medication. Pt stated his heart doctor took him off 2 years ago.

## 2023-04-14 ENCOUNTER — Encounter (HOSPITAL_COMMUNITY): Payer: Self-pay

## 2023-04-15 ENCOUNTER — Other Ambulatory Visit (HOSPITAL_COMMUNITY): Payer: Self-pay

## 2023-04-15 ENCOUNTER — Other Ambulatory Visit: Payer: Self-pay | Admitting: Hematology

## 2023-04-15 ENCOUNTER — Other Ambulatory Visit: Payer: Self-pay

## 2023-04-15 DIAGNOSIS — C61 Malignant neoplasm of prostate: Secondary | ICD-10-CM

## 2023-04-15 MED ORDER — ABIRATERONE ACETATE 250 MG PO TABS
1000.0000 mg | ORAL_TABLET | Freq: Every day | ORAL | 2 refills | Status: DC
Start: 2023-04-15 — End: 2023-07-14
  Filled 2023-04-15: qty 120, 30d supply, fill #0
  Filled 2023-05-25: qty 120, 30d supply, fill #1
  Filled 2023-06-24: qty 120, 30d supply, fill #2

## 2023-04-17 NOTE — Progress Notes (Unsigned)
Patient Care Team: Shirline Frees, NP as PCP - General (Family Medicine) Tonny Bollman, MD as PCP - Cardiology (Cardiology) Crist Fat, MD as Attending Physician (Urology) Kennon Rounds as Physician Assistant (Cardiology) Malachy Mood, MD as Attending Physician (Hematology and Oncology) Sherrill Raring, [facility] (Inactive) (Pharmacist)   CHIEF COMPLAINT: Follow up prostate cancer   Oncology History  Malignant neoplasm of prostate (HCC)  06/23/2018 Initial Diagnosis   Malignant neoplasm of prostate (HCC)   07/07/2018 - 10/20/2018 Chemotherapy   The patient had pegfilgrastim (NEULASTA ONPRO KIT) injection 6 mg, 6 mg, Subcutaneous, Once, 5 of 5 cycles Administration: 6 mg (07/28/2018), 6 mg (08/18/2018), 6 mg (09/08/2018), 6 mg (09/29/2018), 6 mg (10/20/2018) pegfilgrastim-cbqv (UDENYCA) injection 6 mg, 6 mg, Subcutaneous, Once, 1 of 1 cycle Administration: 6 mg (07/08/2018) DOCEtaxel (TAXOTERE) 170 mg in sodium chloride 0.9 % 250 mL chemo infusion, 75 mg/m2 = 170 mg, Intravenous,  Once, 6 of 6 cycles Administration: 170 mg (07/07/2018), 170 mg (07/28/2018), 170 mg (08/18/2018), 170 mg (09/08/2018), 170 mg (09/29/2018), 170 mg (10/20/2018)  for chemotherapy treatment.       CURRENT THERAPY:  Eligard 30 mg every 4 months.   Zytiga 1000 mg daily and prednisone 5 mg daily started in April 2020.  INTERVAL HISTORY Matthew Serrano returns for follow up as scheduled. Last seen by Dr. Mosetta Putt 11/2022. 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   History of nuclear stress test    Myoview 8/18: EF 64, inferobasal thinning, no ischemia, low risk   HYPERLIPIDEMIA  07/06/2007   HYPERTENSION 07/06/2007   HYPOTHYROIDISM 07/06/2007   Leg pain    ABIs 8/18:  Normal    Obesity    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   23 mm human aortic root graft with 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 and reimplantation of left main and right coronary arteries   Severe aortic insufficiency 05/13/2020   acute onset in setting of bacterial endocarditis   Severe aortic stenosis      Past Surgical History:  Procedure Laterality Date   AORTIC VALVE REPLACEMENT N/A 05/15/2020   Procedure: AORTIC VALVE REPLACEMENT (AVR) SIZE 23 MM,  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;  Surgeon: Purcell Nails, MD;  Location: [facility] OR;  Service: Open Heart Surgery;  Laterality: N/A;   ASCENDING AORTIC ROOT REPLACEMENT N/A 05/15/2020   Procedure: HUMAN ALLOGRAFT AORTIC ROOT REPLACEMENT;  Surgeon: Purcell Nails, MD;  Location: [facility] OR;  Service: Open Heart Surgery;  Laterality: N/A;   BUBBLE STUDY  03/03/2022   Procedure: BUBBLE STUDY;  Surgeon: Meriam Sprague, MD;  Location: [facility] ENDOSCOPY;  Service: Cardiovascular;;   COLONOSCOPY     COLONOSCOPY N/A 05/03/2020   Procedure: COLONOSCOPY;  Surgeon: Charna Elizabeth, MD;  Location: [facility] ENDOSCOPY;  Service: Endoscopy;  Laterality: N/A;   COLONOSCOPY W/ BIOPSIES AND POLYPECTOMY     ENTEROSCOPY N/A 05/02/2020   Procedure: ENTEROSCOPY;  Surgeon: Meridee Score Netty Starring., MD;  Location: [facility] ENDOSCOPY;  Service: Gastroenterology;  Laterality: N/A;   HEMOSTASIS CLIP PLACEMENT  05/03/2020   Procedure: HEMOSTASIS CLIP PLACEMENT;  Surgeon: Charna Elizabeth, MD;  Location: [facility] ENDOSCOPY;  Service: Endoscopy;;   HOT HEMOSTASIS N/A 05/03/2020   Procedure: HOT HEMOSTASIS (ARGON PLASMA COAGULATION/BICAP);  Surgeon: Charna Elizabeth, MD;  Location: [facility] ENDOSCOPY;  Service: Endoscopy;  Laterality: N/A;   IR IMAGING GUIDED PORT INSERTION  07/05/2018   POLYPECTOMY     PORT-A-CATH REMOVAL Right 05/20/2020   Procedure: REMOVAL  PORT-A-CATH;  Surgeon: Purcell Nails, MD;  Location: [facility] OR;  Service: Thoracic;  Laterality: Right;   RIGHT/LEFT HEART CATH AND CORONARY ANGIOGRAPHY N/A 02/13/2020   Procedure: RIGHT/LEFT HEART CATH AND CORONARY ANGIOGRAPHY;  Surgeon: Lyn Records, MD;  Location: [facility] INVASIVE CV LAB;  Service: Cardiovascular;  Laterality: N/A;   SUBMUCOSAL INJECTION  05/02/2020   Procedure: SUBMUCOSAL INJECTION;  Surgeon: Meridee Score Netty Starring., MD;  Location: [facility] ENDOSCOPY;  Service: Gastroenterology;;   TEE WITHOUT CARDIOVERSION N/A 05/14/2020   Procedure: TRANSESOPHAGEAL ECHOCARDIOGRAM (TEE);  Surgeon: Thurmon Fair, MD;  Location[facility] ENDOSCOPY;  Service: Cardiovascular;  Laterality: N/A;   TEE WITHOUT CARDIOVERSION N/A 05/15/2020   Procedure: TRANSESOPHAGEAL ECHOCARDIOGRAM (TEE);  Surgeon: Purcell Nails, MD;  Location: [facility] OR;  Service: Open Heart Surgery;  Laterality: N/A;   TEE WITHOUT CARDIOVERSION N/A 03/03/2022   Procedure: TRANSESOPHAGEAL ECHOCARDIOGRAM (TEE);  Surgeon: Meriam Sprague, MD;  Location: [facility] ENDOSCOPY;  Service: Cardiovascular;  Laterality: N/A;     Outpatient Encounter Medications as of 04/18/2023  Medication Sig   abiraterone acetate (ZYTIGA) 250 MG tablet Take 4 tablets (1,000 mg total) by mouth daily. Take on an empty stomach 1 hour before or 2 hours after a meal   Alcohol Swabs (B-D SINGLE USE SWABS REGULAR) PADS 100 each by Does not apply route as directed.   aspirin EC 81 MG tablet Take 81 mg by mouth daily. Swallow whole.   atorvastatin (LIPITOR) 80 MG tablet TAKE 1 TABLET EVERY DAY   Blood Glucose Calibration (TRUE METRIX LEVEL 1) Low SOLN 1 each by Other route as directed.   calcium carbonate (CALCIUM 600) 1500 (600 Ca) MG TABS tablet Take 0.8 tablets (1,200 mg total) by mouth daily.   cholecalciferol (VITAMIN D3) 25 MCG (1000 UT) tablet Take 1,000 Units by mouth daily.   ferrous sulfate 325 (65 FE) MG EC tablet Take 1 tablet (325 mg total) by mouth 2 (two) times daily  before a meal.   glipiZIDE (GLUCOTROL XL) 10 MG 24 hr tablet TAKE 1 TABLET TWICE DAILY   labetalol (NORMODYNE) 200 MG tablet TAKE 3 TABLETS EVERY MORNING AND TAKE 2 TABLETS EVERY EVENING   levothyroxine (SYNTHROID) 125 MCG tablet TAKE 1 TABLET EVERY DAY   lisinopril (ZESTRIL) 2.5 MG tablet Take 1 tablet (2.5 mg total) by mouth daily.   metFORMIN (GLUCOPHAGE) 1000 MG tablet TAKE 1 TABLET TWICE DAILY WITH MEALS   omeprazole (PRILOSEC) 40 MG capsule TAKE 1 CAPSULE EVERY DAY   potassium chloride SA (KLOR-CON M) 20 MEQ tablet Take 1 tablet (20 mEq total) by mouth daily.   predniSONE (DELTASONE) 5 MG tablet Take 1 tablet (5 mg total) by mouth daily with breakfast.   spironolactone (ALDACTONE) 25 MG tablet Take 1 tablet (25 mg total) by mouth every other day.   torsemide (DEMADEX) 20 MG tablet TAKE 1 TABLET EVERY DAY   No facility-administered encounter medications on file as of 04/18/2023.     There were no vitals filed for this visit. There is no height or weight on file to calculate BMI.   PHYSICAL EXAM GENERAL:alert, no distress and comfortable SKIN:  no rash  EYES: sclera clear NECK: without mass LYMPH:  no palpable cervical or supraclavicular lymphadenopathy  LUNGS: clear with normal breathing effort HEART: regular rate & rhythm, no lower extremity edema ABDOMEN: abdomen soft, non-tender and normal bowel sounds NEURO: alert & oriented x 3 with fluent speech, no focal motor/sensory deficits Breast exam:  PAC without erythema    CBC    Component Value Date/Time   WBC 6.7 04/08/2023 0824   RBC 4.04 (L) 04/08/2023 0824   HGB 11.5 (L) 04/08/2023 0824   HGB 11.7 (L) 12/16/2022 0937   HGB 10.3 (L) 12/22/2020 0847   HCT 35.5 (L) 04/08/2023 0824   HCT 31.5 (L) 12/22/2020 0847   PLT 175.0 04/08/2023 0824   PLT 171 12/16/2022 0937   PLT 190 12/22/2020 0847   MCV 88.0 04/08/2023 0824   MCV 83 12/22/2020 0847   MCH 29.2 12/16/2022 0937   MCHC 32.3 04/08/2023 0824   RDW 14.1 04/08/2023  0824   RDW 14.0 12/22/2020 0847   LYMPHSABS 1.3 12/16/2022 0937   LYMPHSABS 2.3 12/22/2020 0847   MONOABS 0.4 12/16/2022 0937   EOSABS 0.1 12/16/2022 0937   EOSABS 0.2 12/22/2020 0847   BASOSABS 0.1 12/16/2022 0937   BASOSABS 0.0 12/22/2020 0847     CMP     Component Value Date/Time   NA 133 (L) 04/08/2023 0824   NA 144 02/19/2022 0819   K 4.4 04/08/2023 0824   CL 98 04/08/2023 0824   CO2 27 04/08/2023 0824   GLUCOSE 105 (H) 04/08/2023 0824   BUN 33 (H) 04/08/2023 0824   BUN 20 02/19/2022 0819   CREATININE 1.33 04/08/2023 0824   CREATININE 1.32 (H) 12/16/2022 0937   CREATININE 1.08 05/17/2022 0918   CALCIUM 9.4 04/08/2023 0824   PROT 6.0 04/08/2023 0824   PROT 6.2 06/30/2020 1516   ALBUMIN 4.1 04/08/2023 0824   ALBUMIN 3.7 06/30/2020 1516   AST 19 04/08/2023 0824   AST 19 12/16/2022 0937   ALT 20 04/08/2023 0824   ALT 15 12/16/2022 0937   ALKPHOS 57 04/08/2023 0824   BILITOT 0.6 04/08/2023 0824   BILITOT 0.9 12/16/2022 0937   GFRNONAA 56 (L) 12/16/2022 0937   GFRAA 99 07/03/2020 1016   GFRAA >60 04/29/2020 0948     ASSESSMENT & PLAN:Matthew E Leonid Baise. is a 77 y.o. male with    Malignant neoplasm of prostate; stage IV with node metastasis, diagnosed in 2019, castration-resistant now  -Initial prostate biopsy obtained on 06/06/2018 which showed high-volume disease with a Gleason score 4+5 = 9. -first line therapy with Taxotere chemotherapy at 75 mg/m every 3 weeks started on 07/07/2018 and stopped in Feb 2020 after 6 cycles.  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 -currently on Eligard 30 mg every 4 months and Zytiga 1000 mg daily started in April 2020.  His PSA has been undetectable since then  -Will obtain a PSMA PET scan if his PSA increases significantly    Osteopenia -He was started on Prolia Inlyta 2023, however his insurance does not cover it. -Will continue calcium and vitamin D, and weight bearing -  repeat bone density scan in 2025      PLAN:  No orders of the defined types were placed in this encounter.     All questions were answered. The patient knows to call the clinic with any problems, questions or concerns. No barriers to learning were detected. I spent *** counseling the  patient face to face. The total time spent in the appointment was *** and more than 50% was on counseling, review of test results, and coordination of care.   Santiago Glad, NP-C @DATE @

## 2023-04-18 ENCOUNTER — Telehealth: Payer: Self-pay | Admitting: Adult Health

## 2023-04-18 ENCOUNTER — Inpatient Hospital Stay: Payer: Medicare HMO | Admitting: Nurse Practitioner

## 2023-04-18 ENCOUNTER — Encounter: Payer: Self-pay | Admitting: Nurse Practitioner

## 2023-04-18 ENCOUNTER — Inpatient Hospital Stay: Payer: Medicare HMO

## 2023-04-18 ENCOUNTER — Inpatient Hospital Stay: Payer: Medicare HMO | Attending: Hematology

## 2023-04-18 VITALS — BP 138/61 | HR 63 | Temp 98.5°F | Resp 17 | Ht 67.0 in | Wt 200.3 lb

## 2023-04-18 DIAGNOSIS — C61 Malignant neoplasm of prostate: Secondary | ICD-10-CM | POA: Diagnosis not present

## 2023-04-18 DIAGNOSIS — Z5112 Encounter for antineoplastic immunotherapy: Secondary | ICD-10-CM | POA: Diagnosis not present

## 2023-04-18 DIAGNOSIS — Z79899 Other long term (current) drug therapy: Secondary | ICD-10-CM | POA: Insufficient documentation

## 2023-04-18 DIAGNOSIS — M8580 Other specified disorders of bone density and structure, unspecified site: Secondary | ICD-10-CM | POA: Insufficient documentation

## 2023-04-18 DIAGNOSIS — E11649 Type 2 diabetes mellitus with hypoglycemia without coma: Secondary | ICD-10-CM | POA: Insufficient documentation

## 2023-04-18 LAB — CBC WITH DIFFERENTIAL (CANCER CENTER ONLY)
Abs Immature Granulocytes: 0.04 10*3/uL (ref 0.00–0.07)
Basophils Absolute: 0.1 10*3/uL (ref 0.0–0.1)
Basophils Relative: 1 %
Eosinophils Absolute: 0.2 10*3/uL (ref 0.0–0.5)
Eosinophils Relative: 3 %
HCT: 34 % — ABNORMAL LOW (ref 39.0–52.0)
Hemoglobin: 10.9 g/dL — ABNORMAL LOW (ref 13.0–17.0)
Immature Granulocytes: 1 %
Lymphocytes Relative: 23 %
Lymphs Abs: 1.8 10*3/uL (ref 0.7–4.0)
MCH: 28.5 pg (ref 26.0–34.0)
MCHC: 32.1 g/dL (ref 30.0–36.0)
MCV: 89 fL (ref 80.0–100.0)
Monocytes Absolute: 0.5 10*3/uL (ref 0.1–1.0)
Monocytes Relative: 7 %
Neutro Abs: 5.2 10*3/uL (ref 1.7–7.7)
Neutrophils Relative %: 65 %
Platelet Count: 167 10*3/uL (ref 150–400)
RBC: 3.82 MIL/uL — ABNORMAL LOW (ref 4.22–5.81)
RDW: 13.6 % (ref 11.5–15.5)
WBC Count: 7.7 10*3/uL (ref 4.0–10.5)
nRBC: 0 % (ref 0.0–0.2)

## 2023-04-18 LAB — CMP (CANCER CENTER ONLY)
ALT: 20 U/L (ref 0–44)
AST: 20 U/L (ref 15–41)
Albumin: 3.8 g/dL (ref 3.5–5.0)
Alkaline Phosphatase: 53 U/L (ref 38–126)
Anion gap: 11 (ref 5–15)
BUN: 39 mg/dL — ABNORMAL HIGH (ref 8–23)
CO2: 24 mmol/L (ref 22–32)
Calcium: 9.5 mg/dL (ref 8.9–10.3)
Chloride: 106 mmol/L (ref 98–111)
Creatinine: 1.43 mg/dL — ABNORMAL HIGH (ref 0.61–1.24)
GFR, Estimated: 51 mL/min — ABNORMAL LOW (ref >60)
Glucose, Bld: 184 mg/dL — ABNORMAL HIGH (ref 70–99)
Potassium: 4.1 mmol/L (ref 3.5–5.1)
Sodium: 141 mmol/L (ref 135–145)
Total Bilirubin: 0.6 mg/dL (ref 0.3–1.2)
Total Protein: 5.9 g/dL — ABNORMAL LOW (ref 6.5–8.1)

## 2023-04-18 MED ORDER — LEUPROLIDE ACETATE (4 MONTH) 30 MG ~~LOC~~ KIT
30.0000 mg | PACK | Freq: Once | SUBCUTANEOUS | Status: AC
  Administered 2023-04-18: 30 mg via SUBCUTANEOUS
  Filled 2023-04-18: qty 30

## 2023-04-18 NOTE — Telephone Encounter (Signed)
Ernestina Columbia Tech w/[facility]  [employer] [facility] - [place], [place] - [address] Phone: [phone]  Fax: [phone]     Called requesting clarity regarding instructions for:  calcium carbonate calcium carbonate (CALCIUM 600) 1500 (600 Ca) MG TABS tablet   Ref # [id number]  Romeo Apple was informed NP is OOO on Mondays.

## 2023-04-19 ENCOUNTER — Ambulatory Visit (HOSPITAL_COMMUNITY)
Admission: RE | Admit: 2023-04-19 | Discharge: 2023-04-19 | Disposition: A | Discharge status: Home / Self Care | Payer: Medicare HMO | Source: Ambulatory Visit | Attending: Cardiovascular Disease | Admitting: Cardiovascular Disease

## 2023-04-19 DIAGNOSIS — I5032 Chronic diastolic (congestive) heart failure: Secondary | ICD-10-CM

## 2023-04-19 DIAGNOSIS — I10 Essential (primary) hypertension: Secondary | ICD-10-CM | POA: Diagnosis not present

## 2023-04-19 DIAGNOSIS — Q2543 Congenital aneurysm of aorta: Secondary | ICD-10-CM

## 2023-04-19 LAB — PROSTATE-SPECIFIC AG, SERUM (LABCORP): Prostate Specific Ag, Serum: <0.1 ng/mL (ref 0.0–4.0)

## 2023-04-19 MED ORDER — IOHEXOL 350 MG/ML SOLN
100.0000 mL | Freq: Once | INTRAVENOUS | Status: AC | PRN
  Administered 2023-04-19: 100 mL via INTRAVENOUS

## 2023-04-19 NOTE — Telephone Encounter (Signed)
This has already been filled and sent to pharmacy.

## 2023-04-19 NOTE — Telephone Encounter (Signed)
 Called pt to advise but no answer

## 2023-04-20 ENCOUNTER — Other Ambulatory Visit (HOSPITAL_COMMUNITY): Payer: Self-pay

## 2023-04-22 ENCOUNTER — Telehealth: Payer: Self-pay

## 2023-04-22 DIAGNOSIS — I330 Acute and subacute infective endocarditis: Secondary | ICD-10-CM

## 2023-04-22 NOTE — Telephone Encounter (Signed)
Called and spoke with patient who verbalized understanding and agrees to repeat CTA next year. Order placed at this time.

## 2023-04-22 NOTE — Telephone Encounter (Signed)
-----   Message from Tonny Bollman sent at 04/21/2023 10:51 AM EDT ----- Results reviewed.  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  Recent echo performed and shows essentially normal bioprosthetic aortic valve function with it low/normal transvalvular gradient.  Would continue current management and recommend a repeat CTA in 1 year for surveillance.

## 2023-04-25 DIAGNOSIS — E118 Type 2 diabetes mellitus with unspecified complications: Secondary | ICD-10-CM | POA: Diagnosis not present

## 2023-05-04 DIAGNOSIS — E119 Type 2 diabetes mellitus without complications: Secondary | ICD-10-CM | POA: Diagnosis not present

## 2023-05-04 DIAGNOSIS — H524 Presbyopia: Secondary | ICD-10-CM | POA: Diagnosis not present

## 2023-05-04 DIAGNOSIS — H25813 Combined forms of age-related cataract, bilateral: Secondary | ICD-10-CM | POA: Diagnosis not present

## 2023-05-04 DIAGNOSIS — Z794 Long term (current) use of insulin: Secondary | ICD-10-CM | POA: Diagnosis not present

## 2023-05-04 DIAGNOSIS — H40013 Open angle with borderline findings, low risk, bilateral: Secondary | ICD-10-CM | POA: Diagnosis not present

## 2023-05-13 DIAGNOSIS — E118 Type 2 diabetes mellitus with unspecified complications: Secondary | ICD-10-CM | POA: Diagnosis not present

## 2023-05-25 ENCOUNTER — Other Ambulatory Visit: Payer: Self-pay

## 2023-05-25 NOTE — Progress Notes (Signed)
Specialty Pharmacy Refill Coordination Note  Matthew Serrano. is a 78 y.o. male contacted today regarding refills of specialty medication(s) Abiraterone Acetate   Patient requested Delivery   Delivery date: 06/01/23   Verified address: [address] [place] [address]   Medication will be filled on 05/31/23.

## 2023-05-27 DIAGNOSIS — E118 Type 2 diabetes mellitus with unspecified complications: Secondary | ICD-10-CM | POA: Diagnosis not present

## 2023-05-31 ENCOUNTER — Other Ambulatory Visit: Payer: Self-pay

## 2023-05-31 ENCOUNTER — Other Ambulatory Visit (HOSPITAL_COMMUNITY): Payer: Self-pay

## 2023-05-31 ENCOUNTER — Other Ambulatory Visit (HOSPITAL_BASED_OUTPATIENT_CLINIC_OR_DEPARTMENT_OTHER): Payer: Self-pay

## 2023-06-01 ENCOUNTER — Other Ambulatory Visit: Payer: Self-pay

## 2023-06-01 ENCOUNTER — Other Ambulatory Visit (HOSPITAL_COMMUNITY): Payer: Self-pay

## 2023-06-12 DIAGNOSIS — E118 Type 2 diabetes mellitus with unspecified complications: Secondary | ICD-10-CM | POA: Diagnosis not present

## 2023-06-13 ENCOUNTER — Other Ambulatory Visit: Payer: Self-pay

## 2023-06-13 ENCOUNTER — Encounter: Payer: Self-pay | Admitting: Hematology

## 2023-06-13 ENCOUNTER — Encounter (HOSPITAL_COMMUNITY): Payer: Self-pay | Admitting: Internal Medicine

## 2023-06-13 ENCOUNTER — Telehealth: Payer: Self-pay | Admitting: Pharmacy Technician

## 2023-06-13 ENCOUNTER — Other Ambulatory Visit (HOSPITAL_COMMUNITY): Payer: Self-pay

## 2023-06-13 NOTE — Telephone Encounter (Signed)
Oral Oncology Patient Advocate Encounter  Was successful in securing patient a $8,000 grant from [facility] to provide copayment coverage for abiraterone.  This will keep the out of pocket expense at $0.     [employer] ID: [id number]  I have spoken with the patient.   The billing information is as follows and has been shared with [facility].    RxBin: [id number] PCN: [id number] Member ID: [id number] Group ID: [id number] Dates of Eligibility: 05/26/23 through 05/24/24  Fund:  Prostate  Jinger Neighbors, CPhT-Adv Oncology Pharmacy Patient Advocate [facility] Direct Number: [phone]  Fax: [phone]

## 2023-06-23 ENCOUNTER — Other Ambulatory Visit: Payer: Self-pay

## 2023-06-24 ENCOUNTER — Other Ambulatory Visit: Payer: Self-pay

## 2023-06-24 NOTE — Progress Notes (Signed)
Specialty Pharmacy Refill Coordination Note  Matthew Ernest. is a 77 y.o. male contacted today regarding refills of specialty medication(s) Abiraterone Acetate   Patient requested Delivery   Delivery date: 06/28/23   Verified address: [address]   [place] [address]   Medication will be filled on 06/27/23.

## 2023-06-27 ENCOUNTER — Other Ambulatory Visit: Payer: Self-pay

## 2023-06-30 ENCOUNTER — Other Ambulatory Visit: Payer: Self-pay | Admitting: Adult Health

## 2023-06-30 DIAGNOSIS — E119 Type 2 diabetes mellitus without complications: Secondary | ICD-10-CM

## 2023-06-30 DIAGNOSIS — E118 Type 2 diabetes mellitus with unspecified complications: Secondary | ICD-10-CM | POA: Diagnosis not present

## 2023-07-04 DIAGNOSIS — H40013 Open angle with borderline findings, low risk, bilateral: Secondary | ICD-10-CM | POA: Diagnosis not present

## 2023-07-04 LAB — HM DIABETES EYE EXAM

## 2023-07-12 DIAGNOSIS — E118 Type 2 diabetes mellitus with unspecified complications: Secondary | ICD-10-CM | POA: Diagnosis not present

## 2023-07-14 ENCOUNTER — Other Ambulatory Visit (HOSPITAL_COMMUNITY): Payer: Self-pay

## 2023-07-14 ENCOUNTER — Other Ambulatory Visit: Payer: Self-pay

## 2023-07-14 ENCOUNTER — Other Ambulatory Visit: Payer: Self-pay | Admitting: Hematology

## 2023-07-14 DIAGNOSIS — C61 Malignant neoplasm of prostate: Secondary | ICD-10-CM

## 2023-07-14 MED ORDER — ABIRATERONE ACETATE 250 MG PO TABS
1000.0000 mg | ORAL_TABLET | Freq: Every day | ORAL | 2 refills | Status: DC
  Filled 2023-07-14: qty 120, 30d supply, fill #0
  Filled 2023-08-22: qty 120, 30d supply, fill #1
  Filled 2023-09-12: qty 120, 30d supply, fill #2

## 2023-07-14 NOTE — Progress Notes (Signed)
Specialty Pharmacy Refill Coordination Note  Matthew Serrano. is a 77 y.o. male contacted today regarding refills of specialty medication(s) Abiraterone Acetate   Patient requested Delivery   Delivery date: 07/27/23   Verified address: [address]   [place] [address]   Medication will be filled on 07/26/23 pending a refill request.

## 2023-07-27 DIAGNOSIS — E118 Type 2 diabetes mellitus with unspecified complications: Secondary | ICD-10-CM | POA: Diagnosis not present

## 2023-07-28 ENCOUNTER — Other Ambulatory Visit: Payer: Self-pay

## 2023-07-28 NOTE — Progress Notes (Signed)
Date/time removed from workbasket so shipment did not go out as scheduled. Patient called the pharmacy to inquire. Advised we can ship today to arrive tomorrow and he is in agreement.

## 2023-07-29 ENCOUNTER — Ambulatory Visit (INDEPENDENT_AMBULATORY_CARE_PROVIDER_SITE_OTHER): Payer: Medicare HMO

## 2023-07-29 DIAGNOSIS — Z23 Encounter for immunization: Secondary | ICD-10-CM

## 2023-08-03 ENCOUNTER — Other Ambulatory Visit: Payer: Self-pay | Admitting: Adult Health

## 2023-08-03 DIAGNOSIS — E782 Mixed hyperlipidemia: Secondary | ICD-10-CM

## 2023-08-09 ENCOUNTER — Other Ambulatory Visit: Payer: Self-pay

## 2023-08-09 DIAGNOSIS — E118 Type 2 diabetes mellitus with unspecified complications: Secondary | ICD-10-CM | POA: Diagnosis not present

## 2023-08-11 DIAGNOSIS — E118 Type 2 diabetes mellitus with unspecified complications: Secondary | ICD-10-CM | POA: Diagnosis not present

## 2023-08-22 ENCOUNTER — Other Ambulatory Visit (HOSPITAL_COMMUNITY): Payer: Self-pay | Admitting: Pharmacy Technician

## 2023-08-22 ENCOUNTER — Other Ambulatory Visit (HOSPITAL_COMMUNITY): Payer: Self-pay

## 2023-08-22 NOTE — Progress Notes (Signed)
Specialty Pharmacy Refill Coordination Note  Matthew Serrano. is a 77 y.o. male contacted today regarding refills of specialty medication(s) Abiraterone Acetate Matthew Serrano)   Patient requested Delivery   Delivery date: 08/26/23   Verified address: [address]  [place]   Medication will be filled on 08/25/23.

## 2023-08-23 ENCOUNTER — Other Ambulatory Visit: Payer: Self-pay

## 2023-08-23 ENCOUNTER — Other Ambulatory Visit: Payer: Self-pay | Admitting: Nurse Practitioner

## 2023-08-24 ENCOUNTER — Other Ambulatory Visit: Payer: Self-pay | Admitting: Nurse Practitioner

## 2023-08-24 DIAGNOSIS — C61 Malignant neoplasm of prostate: Secondary | ICD-10-CM

## 2023-08-24 NOTE — Progress Notes (Signed)
 Patient Care Team: Merna Huxley, NP as PCP - General (Family Medicine) Wonda Sharper, MD as PCP - Cardiology (Cardiology) Cam Morene ORN, MD as Attending Physician (Urology) Lelon Glendia ONEIDA DEVONNA as Physician Assistant (Cardiology) Lanny Callander, MD as Attending Physician (Hematology and Oncology) Lionell Jon DEL, [facility] (Pharmacist)  Clinic Day:  08/25/2023  Referring physician: Merna Huxley, NP  ASSESSMENT & PLAN:   Assessment & Plan: Malignant neoplasm of prostate [facility]) -stage IV with node metastasis, diagnosed in 2019, castration-resistant now  -Initial prostate biopsy obtained on 06/06/2018 which showed high-volume disease with a Gleason score 4+5 = 9. -first line therapy with Taxotere  chemotherapy at 75 mg/m every 3 weeks started on 07/07/2018 and stopped in Feb 2020 after 6 cycles.  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 -currently on Eligard  30 mg every 4 months and Zytiga  1000 mg daily started in April 2020.  His PSA has been undetectable since then  -He is clinically doing very well, asymptomatic, will continue current treatment. -Will obtain a PSMA PET scan if his PSA increases significantly.  Most recent PSA done 04/18/2023 was<0.1.    The patient understands the plans discussed today and is in agreement with them.  He knows to contact our office if he develops concerns prior to his next appointment.  I provided 30 minutes of face-to-face time during this encounter and > 50% was spent counseling as documented under my assessment and plan.    Powell FORBES Lessen, NP  [facility] - A DEPT OF [facility] [address] [place] [address] Dept: [phone] Dept Fax: [phone]   No orders of the defined types were placed in this encounter.     CHIEF COMPLAINT:  CC: Malignant neoplasm of the prostate  Current Treatment: Eligard  30 mg  every 4 months, Zytiga  1000 mg daily, and prednisone  5 mg daily.  INTERVAL HISTORY:  Matthew Serrano is here today for repeat clinical assessment.  He was last seen by Lacie, NP on 04/18/2023.  His PSA at that time was <0.1.  he reports presence of blood in the stool which is an orange color in nature. He was to have colonoscopy in 04/2023 but missed his appointment. His last colonoscopy was done 04/2020. He denies chest pain or chest pressure. He reports some shortness of breath, especially with exertion. He has had pulmonary valve replacement in 2021. He does see cardiology. He denies headaches or visual disturbances. He denies abdominal pain, nausea, vomiting, or diarrhea. He does have intermittent constipation.  He denies fevers or chills. He denies pain. His appetite is good. His weight has increased 10 pounds over last 4 months .  I have reviewed the past medical history, past surgical history, social history and family history with the patient and they are unchanged from previous note.  ALLERGIES:  has no known allergies.  MEDICATIONS:  Current Outpatient Medications  Medication Sig Dispense Refill   abiraterone  acetate (ZYTIGA ) 250 MG tablet Take 4 tablets (1,000 mg total) by mouth daily. Take on an empty stomach 1 hour before or 2 hours after a meal 120 tablet 2   Alcohol Swabs (DROPSAFE ALCOHOL PREP) 70 % PADS AS DIRECTED 300 each 3   aspirin  EC 81 MG tablet Take 81 mg by mouth daily. Swallow whole.     atorvastatin  (LIPITOR ) 80 MG tablet TAKE 1 TABLET EVERY DAY 90 tablet 3   Blood Glucose  Calibration (TRUE METRIX LEVEL 1) Low SOLN 1 each by Other route as directed. 1 each 0   calcium  carbonate (CALCIUM  600) 1500 (600 Ca) MG TABS tablet Take 0.8 tablets (1,200 mg total) by mouth daily. 30 tablet 0   cholecalciferol (VITAMIN D3) 25 MCG (1000 UT) tablet Take 1,000 Units by mouth daily.     ferrous sulfate  325 (65 FE) MG EC tablet Take 1 tablet (325 mg total) by mouth 2 (two) times daily before a meal.  60 tablet 2   glipiZIDE  (GLUCOTROL  XL) 10 MG 24 hr tablet TAKE 1 TABLET TWICE DAILY 180 tablet 3   labetalol  (NORMODYNE ) 200 MG tablet TAKE 3 TABLETS EVERY MORNING AND TAKE 2 TABLETS EVERY EVENING 300 tablet 1   levothyroxine  (SYNTHROID ) 125 MCG tablet TAKE 1 TABLET EVERY DAY 90 tablet 3   lisinopril  (ZESTRIL ) 2.5 MG tablet TAKE 1 TABLET EVERY DAY 90 tablet 3   metFORMIN  (GLUCOPHAGE ) 1000 MG tablet TAKE 1 TABLET TWICE DAILY WITH MEALS 180 tablet 0   omeprazole  (PRILOSEC) 40 MG capsule TAKE 1 CAPSULE EVERY DAY 90 capsule 3   potassium chloride  SA (KLOR-CON  M) 20 MEQ tablet Take 1 tablet (20 mEq total) by mouth daily. 90 tablet 3   predniSONE  (DELTASONE ) 5 MG tablet Take 1 tablet (5 mg total) by mouth daily with breakfast. 90 tablet 3   spironolactone  (ALDACTONE ) 25 MG tablet Take 1 tablet (25 mg total) by mouth every other day. 45 tablet 2   torsemide  (DEMADEX ) 20 MG tablet TAKE 1 TABLET EVERY DAY 90 tablet 2   No current facility-administered medications for this visit.   Facility-Administered Medications Ordered in Other Visits  Medication Dose Route Frequency Provider Last Rate Last Admin   Leuprolide  Acetate (4 Month) (ELIGARD ) injection 30 mg  30 mg Subcutaneous Once Denya Buckingham E, NP        HISTORY OF PRESENT ILLNESS:   Oncology History  Malignant neoplasm of prostate (HCC)  06/23/2018 Initial Diagnosis   Malignant neoplasm of prostate (HCC)   07/07/2018 - 10/20/2018 Chemotherapy   The patient had pegfilgrastim  (NEULASTA  ONPRO KIT) injection 6 mg, 6 mg, Subcutaneous, Once, 5 of 5 cycles Administration: 6 mg (07/28/2018), 6 mg (08/18/2018), 6 mg (09/08/2018), 6 mg (09/29/2018), 6 mg (10/20/2018) pegfilgrastim -cbqv (UDENYCA ) injection 6 mg, 6 mg, Subcutaneous, Once, 1 of 1 cycle Administration: 6 mg (07/08/2018) DOCEtaxel  (TAXOTERE ) 170 mg in sodium chloride  0.9 % 250 mL chemo infusion, 75 mg/m2 = 170 mg, Intravenous,  Once, 6 of 6 cycles Administration: 170 mg (07/07/2018), 170 mg  (07/28/2018), 170 mg (08/18/2018), 170 mg (09/08/2018), 170 mg (09/29/2018), 170 mg (10/20/2018)  for chemotherapy treatment.        REVIEW OF SYSTEMS:   Constitutional: Denies fevers, chills or abnormal weight loss.  Eyes: Denies blurriness of vision Ears, nose, mouth, throat, and face: Denies mucositis or sore throat Respiratory: Denies cough, dyspnea or wheezes Cardiovascular: Denies palpitation or chest discomfort. He has some lower extremity swelling and dyspnea with exertion.  Gastrointestinal:  Denies nausea, heartburn or change in bowel habits. Has intermittent constipation with noting some blood in his stool.  Skin: Denies abnormal skin rashes Lymphatics: Denies new lymphadenopathy or easy bruising Neurological:Denies numbness, tingling or new weaknesses Behavioral/Psych: Mood is stable, no new changes  All other systems were reviewed with the patient and are negative.   VITALS:   Today's Vitals   08/25/23 0855  BP: (!) 149/75  Pulse: 62  Resp: 18  Temp: (!) 97.5 F (36.4 C)  TempSrc: Temporal  SpO2: 100%  Weight: 210 lb 12.8 oz (95.6 kg)  Height: 5' 7 (1.702 m)  PainSc: 0-No pain   Body mass index is 33.02 kg/m.   Wt Readings from Last 3 Encounters:  08/25/23 210 lb 12.8 oz (95.6 kg)  04/18/23 200 lb 4.8 oz (90.9 kg)  04/11/23 200 lb 6.4 oz (90.9 kg)    Body mass index is 33.02 kg/m.  Performance status (ECOG): 1 - Symptomatic but completely ambulatory  PHYSICAL EXAM:   GENERAL:alert, no distress and comfortable SKIN: skin color, texture, turgor are normal, no rashes or significant lesions EYES: normal, Conjunctiva are pink and non-injected, sclera clear OROPHARYNX:no exudate, no erythema and lips, buccal mucosa, and tongue normal  NECK: supple, thyroid  normal size, non-tender, without nodularity LYMPH:  no palpable lymphadenopathy in the cervical, axillary or inguinal LUNGS: clear to auscultation and percussion with normal breathing effort HEART:  regular rate & rhythm. Loud, blowing systolic murmer.  ABDOMEN:abdomen soft, non-tender and normal bowel sounds Musculoskeletal:no cyanosis of digits and no clubbing  NEURO: alert & oriented x 3 with fluent speech, no focal motor/sensory deficits  LABORATORY DATA:  I have reviewed the data as listed    Component Value Date/Time   NA 144 08/25/2023 0807   NA 144 02/19/2022 0819   K 4.9 08/25/2023 0807   CL 108 08/25/2023 0807   CO2 28 08/25/2023 0807   GLUCOSE 169 (H) 08/25/2023 0807   BUN 31 (H) 08/25/2023 0807   BUN 20 02/19/2022 0819   CREATININE 1.62 (H) 08/25/2023 0807   CREATININE 1.08 05/17/2022 0918   CALCIUM  9.2 08/25/2023 0807   PROT 6.1 (L) 08/25/2023 0807   PROT 6.2 06/30/2020 1516   ALBUMIN  4.0 08/25/2023 0807   ALBUMIN  3.7 06/30/2020 1516   AST 24 08/25/2023 0807   ALT 22 08/25/2023 0807   ALKPHOS 48 08/25/2023 0807   BILITOT 0.7 08/25/2023 0807   GFRNONAA 44 (L) 08/25/2023 0807   GFRAA 99 07/03/2020 1016   GFRAA >60 04/29/2020 0948     Lab Results  Component Value Date   WBC 5.5 08/25/2023   NEUTROABS 3.8 08/25/2023   HGB 9.9 (L) 08/25/2023   HCT 31.6 (L) 08/25/2023   MCV 93.5 08/25/2023   PLT 172 08/25/2023     Lab Results  Component Value Date   PSA1 <0.1 04/18/2023   PSA1 <0.1 12/16/2022   PSA1 <0.1 10/18/2022   PSA 0.00 (L) 04/08/2023   PSA 0.00 Repeated and verified X2. (L) 07/30/2021   PSA <0.04 07/29/2020

## 2023-08-24 NOTE — Assessment & Plan Note (Addendum)
-  stage IV with node metastasis, diagnosed in 2019, castration-resistant now  -Initial prostate biopsy obtained on 06/06/2018 which showed high-volume disease with a Gleason score 4+5 = 9. -first line therapy with Taxotere  chemotherapy at 75 mg/m every 3 weeks started on 07/07/2018 and stopped in Feb 2020 after 6 cycles.  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 -currently on Eligard  30 mg every 4 months and Zytiga  1000 mg daily started in April 2020.  His PSA has been undetectable since then  -He is clinically doing very well, asymptomatic, will continue current treatment. -Will obtain a PSMA PET scan if his PSA increases significantly.  Most recent PSA done 04/18/2023 was<0.1. 08/25/2023 -PSA pending. Patient more anemic than normal. Having blood present in stool. Sent message out to GI provider to schedule follow up and possible colonoscopy with him. Patient is aware and agreeable to this plan.

## 2023-08-25 ENCOUNTER — Encounter: Payer: Self-pay | Admitting: Nurse Practitioner

## 2023-08-25 ENCOUNTER — Inpatient Hospital Stay: Payer: Medicare HMO

## 2023-08-25 ENCOUNTER — Inpatient Hospital Stay: Payer: Medicare HMO | Admitting: Nurse Practitioner

## 2023-08-25 ENCOUNTER — Inpatient Hospital Stay: Payer: Medicare HMO | Attending: Hematology

## 2023-08-25 ENCOUNTER — Other Ambulatory Visit: Payer: Self-pay

## 2023-08-25 VITALS — BP 149/75 | HR 62 | Temp 97.5°F | Resp 18 | Ht 67.0 in | Wt 210.8 lb

## 2023-08-25 DIAGNOSIS — C61 Malignant neoplasm of prostate: Secondary | ICD-10-CM | POA: Insufficient documentation

## 2023-08-25 DIAGNOSIS — C779 Secondary and unspecified malignant neoplasm of lymph node, unspecified: Secondary | ICD-10-CM | POA: Diagnosis not present

## 2023-08-25 DIAGNOSIS — Z5111 Encounter for antineoplastic chemotherapy: Secondary | ICD-10-CM | POA: Insufficient documentation

## 2023-08-25 LAB — CBC WITH DIFFERENTIAL (CANCER CENTER ONLY)
Abs Immature Granulocytes: 0.02 10*3/uL (ref 0.00–0.07)
Basophils Absolute: 0 10*3/uL (ref 0.0–0.1)
Basophils Relative: 1 %
Eosinophils Absolute: 0.1 10*3/uL (ref 0.0–0.5)
Eosinophils Relative: 3 %
HCT: 31.6 % — ABNORMAL LOW (ref 39.0–52.0)
Hemoglobin: 9.9 g/dL — ABNORMAL LOW (ref 13.0–17.0)
Immature Granulocytes: 0 %
Lymphocytes Relative: 20 %
Lymphs Abs: 1.1 10*3/uL (ref 0.7–4.0)
MCH: 29.3 pg (ref 26.0–34.0)
MCHC: 31.3 g/dL (ref 30.0–36.0)
MCV: 93.5 fL (ref 80.0–100.0)
Monocytes Absolute: 0.4 10*3/uL (ref 0.1–1.0)
Monocytes Relative: 8 %
Neutro Abs: 3.8 10*3/uL (ref 1.7–7.7)
Neutrophils Relative %: 68 %
Platelet Count: 172 10*3/uL (ref 150–400)
RBC: 3.38 MIL/uL — ABNORMAL LOW (ref 4.22–5.81)
RDW: 15.7 % — ABNORMAL HIGH (ref 11.5–15.5)
WBC Count: 5.5 10*3/uL (ref 4.0–10.5)
nRBC: 0 % (ref 0.0–0.2)

## 2023-08-25 LAB — CMP (CANCER CENTER ONLY)
ALT: 22 U/L (ref 0–44)
AST: 24 U/L (ref 15–41)
Albumin: 4 g/dL (ref 3.5–5.0)
Alkaline Phosphatase: 48 U/L (ref 38–126)
Anion gap: 8 (ref 5–15)
BUN: 31 mg/dL — ABNORMAL HIGH (ref 8–23)
CO2: 28 mmol/L (ref 22–32)
Calcium: 9.2 mg/dL (ref 8.9–10.3)
Chloride: 108 mmol/L (ref 98–111)
Creatinine: 1.62 mg/dL — ABNORMAL HIGH (ref 0.61–1.24)
GFR, Estimated: 44 mL/min — ABNORMAL LOW (ref >60)
Glucose, Bld: 169 mg/dL — ABNORMAL HIGH (ref 70–99)
Potassium: 4.9 mmol/L (ref 3.5–5.1)
Sodium: 144 mmol/L (ref 135–145)
Total Bilirubin: 0.7 mg/dL (ref 0.0–1.2)
Total Protein: 6.1 g/dL — ABNORMAL LOW (ref 6.5–8.1)

## 2023-08-25 MED ORDER — LEUPROLIDE ACETATE (4 MONTH) 30 MG ~~LOC~~ KIT
30.0000 mg | PACK | Freq: Once | SUBCUTANEOUS | Status: AC
  Administered 2023-08-25: 30 mg via SUBCUTANEOUS
  Filled 2023-08-25: qty 30

## 2023-08-25 NOTE — Patient Instructions (Signed)

## 2023-08-26 LAB — PSA, TOTAL AND FREE
PSA, Free Pct: UNDETERMINED %
PSA, Free: <0.02 ng/mL
Prostate Specific Ag, Serum: <0.1 ng/mL (ref 0.0–4.0)

## 2023-09-02 DIAGNOSIS — E118 Type 2 diabetes mellitus with unspecified complications: Secondary | ICD-10-CM | POA: Diagnosis not present

## 2023-09-10 DIAGNOSIS — E118 Type 2 diabetes mellitus with unspecified complications: Secondary | ICD-10-CM | POA: Diagnosis not present

## 2023-09-12 ENCOUNTER — Other Ambulatory Visit: Payer: Self-pay

## 2023-09-12 NOTE — Progress Notes (Signed)
Specialty Pharmacy Ongoing Clinical Assessment Note  Matthew Serrano. is a 77 y.o. male who is being followed by the specialty pharmacy service for RxSp Oncology   Patient's specialty medication(s) reviewed today: Abiraterone Acetate (ZYTIGA)   Missed doses in the last 4 weeks: 0   Patient/Caregiver did not have any additional questions or concerns.   Therapeutic benefit summary: Patient is achieving benefit   Adverse events/side effects summary: No adverse events/side effects   Patient's therapy is appropriate to: Continue    Goals Addressed             This Visit's Progress    Slow Disease Progression       Patient is on track. Patient will maintain adherence. PSA undetectable.          Follow up:  6 months  Otto Herb Specialty Pharmacist

## 2023-09-12 NOTE — Progress Notes (Signed)
Specialty Pharmacy Refill Coordination Note  Matthew Serrano. is a 78 y.o. male contacted today regarding refills of specialty medication(s) Abiraterone Acetate [name])   Patient requested Delivery   Delivery date: 09/22/23   Verified address: [address] [place]   Medication will be filled on 09/21/23.

## 2023-09-27 ENCOUNTER — Other Ambulatory Visit: Payer: Self-pay | Admitting: Adult Health

## 2023-09-27 ENCOUNTER — Other Ambulatory Visit: Payer: Self-pay | Admitting: Cardiovascular Disease

## 2023-09-27 DIAGNOSIS — E119 Type 2 diabetes mellitus without complications: Secondary | ICD-10-CM

## 2023-09-29 DIAGNOSIS — E118 Type 2 diabetes mellitus with unspecified complications: Secondary | ICD-10-CM | POA: Diagnosis not present

## 2023-10-04 ENCOUNTER — Other Ambulatory Visit: Payer: Self-pay | Admitting: Adult Health

## 2023-10-04 DIAGNOSIS — E119 Type 2 diabetes mellitus without complications: Secondary | ICD-10-CM

## 2023-10-04 NOTE — Telephone Encounter (Signed)
Last Fill: 04/05/22  Last OV: 04/08/23 Next OV: 11/18/23 AWV  Routing to provider for review/authorization:

## 2023-10-04 NOTE — Telephone Encounter (Signed)
Copied from CRM [id number]. Topic: Clinical - Medication Refill >> Oct 04, 2023  5:28 PM [place] J wrote: Most Recent Primary Care Visit:  Provider: Vickii Chafe  Department: [facility]  Visit Type: FLU SHOT  Date: 07/29/2023  Medication: metFORMIN (GLUCOPHAGE) 1000 MG  Has the patient contacted their pharmacy? Yes (Agent: If no, request that the patient contact the pharmacy for the refill. If patient does not wish to contact the pharmacy document the reason why and proceed with request.) (Agent: If yes, when and what did the pharmacy advise?)  Is this the correct pharmacy for this prescription? Yes If no, delete pharmacy and type the correct one.  This is the patient's preferred pharmacy:  [id number] - [place], [place] - [address] [place] [address] Phone: [phone] Fax: [phone]  [id number] [place], [place] - [address] [place] [address] Phone: [phone] Fax: [phone]  [facility] - [place], [place] - [address] [place] [address] Phone: [phone] Fax: [phone]  [facility] - [place], [place] - [address] [place] [address] Phone: [phone] Fax: [phone]  [facility] [address] [place] [address] Phone: [phone] Fax: [phone]   Has the prescription been filled recently? No  Is the patient out of the medication? Yes  Has the patient been seen for an appointment in the last year OR does the patient have an upcoming appointment? Yes  Can we respond through MyChart? Yes  Agent: Please be advised that Rx refills may take up to 3 business days. We ask that you follow-up with your pharmacy.

## 2023-10-05 MED ORDER — METFORMIN HCL 1000 MG PO TABS: 1000.0000 mg | ORAL_TABLET | Freq: Two times a day (BID) | ORAL | 0 refills | Status: DC

## 2023-10-06 ENCOUNTER — Other Ambulatory Visit (HOSPITAL_COMMUNITY): Payer: Self-pay

## 2023-10-10 DIAGNOSIS — E118 Type 2 diabetes mellitus with unspecified complications: Secondary | ICD-10-CM | POA: Diagnosis not present

## 2023-10-11 ENCOUNTER — Other Ambulatory Visit: Payer: Self-pay

## 2023-10-12 ENCOUNTER — Ambulatory Visit: Payer: Medicare HMO | Admitting: Cardiovascular Disease

## 2023-10-20 ENCOUNTER — Other Ambulatory Visit: Payer: Self-pay

## 2023-10-21 ENCOUNTER — Other Ambulatory Visit: Payer: Self-pay | Admitting: Pharmacy Technician

## 2023-10-21 ENCOUNTER — Other Ambulatory Visit: Payer: Self-pay

## 2023-10-21 ENCOUNTER — Other Ambulatory Visit: Payer: Self-pay | Admitting: Hematology

## 2023-10-21 DIAGNOSIS — C61 Malignant neoplasm of prostate: Secondary | ICD-10-CM

## 2023-10-21 MED ORDER — PREDNISONE 5 MG PO TABS
5.0000 mg | ORAL_TABLET | Freq: Every day | ORAL | 3 refills | Status: AC
  Filled 2023-10-21: qty 90, 90d supply, fill #0
  Filled 2024-01-19: qty 90, 90d supply, fill #1
  Filled 2024-04-18: qty 90, 90d supply, fill #2
  Filled 2024-07-12: qty 90, 90d supply, fill #3

## 2023-10-21 MED ORDER — ABIRATERONE ACETATE 250 MG PO TABS
1000.0000 mg | ORAL_TABLET | Freq: Every day | ORAL | 2 refills | Status: DC
  Filled 2023-10-21: qty 120, 30d supply, fill #0
  Filled 2023-11-17 (×2): qty 120, 30d supply, fill #1
  Filled 2023-12-19: qty 120, 30d supply, fill #2

## 2023-10-21 NOTE — Progress Notes (Signed)
 Specialty Pharmacy Refill Coordination Note  Matthew Serrano. is a 78 y.o. male contacted today regarding refills of specialty medication(s) Abiraterone Acetate [name])   Patient requested Delivery   Delivery date: 10/26/23   Verified address: [address] [place]   Medication will be filled on 10/25/23.  RR sent to MD; Both medications Zytiga & Prednisone

## 2023-10-25 DIAGNOSIS — E118 Type 2 diabetes mellitus with unspecified complications: Secondary | ICD-10-CM | POA: Diagnosis not present

## 2023-10-26 NOTE — Progress Notes (Unsigned)
 Cardiology Office Note:  .   Date:  10/27/2023  ID:  Matthew Serrano., DOB [birth year], MRN [id number] PCP: Shirline Frees, NP  [facility] Providers Cardiologist:  Tonny Bollman, MD Cardiology APP:  Beatrice Lecher, PA-C    Patient Profile: .      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  Last cardiology clinic visit was 04/11/2023 with Dr. Excell Seltzer.  He had received a prescription for doxazosin and was unsure if he should take it.  He was advised by Dr. Excell Seltzer not to take the medication due to concern for orthostatic hypotension.  Complex situation status post aortic valve replacement in the context of aortic valve endocarditis and abscess.  Now with aortic to PA fistula and pseudoaneurysm.  Complex situation felt to be at extremely high risk of surgical repair, he has opted for continued surveillance in light of his age and comorbid conditions with metastatic prostate cancer.  He underwent CTA 04/19/2023 which revealed chronic aortic root abscess and pseudoaneurysm in the LVOT with slight enlargement.  TTE completed 03/15/2023 revealed stable LV and RV size/function, mild MR, plan to continue  medical therapy and clinical follow-up in 6 months.       History of Present Illness: .   Matthew Serrano. is a very pleasant 78 y.o. male who is here alone today for follow-up of aortic aneurysm. He presents with ankle swelling for the past three weeks. He reports feeling 'pretty good' overall, but notes shortness of breath after walking, particularly with walking into the office this morning. He admits this is a farther distance than he usually walks. He lives a pretty sedentary lifestyle. He is able to complete ADLs, he goes to the grocery store and unloads his groceries by himself, and does his own housework but does not do any regular exercise. Admits he is often sitting watching TV with legs not elevated. He continues to receive an injection every 4 months for prostate cancer. He admits to a diet high in salty foods, including pork rinds, sandwich meat, and hot dogs. He cooks at home and does not eat out often. Reports home BP typically 135-150/75-80. He has not experienced any episodes of low blood pressure, but has felt lightheaded due to blood sugar levels. He sleeps on two to three pillows routinely and denies orthopnea or PND. He denies chest pain. Occasionally feels his heart "pumping a little hard." , and watching TV at home. He does not exercise much, but is able to walk and unload groceries without issue. He has eight steps at home, which he navigates regularly, though it sometimes causes shortness of breath. He has noticed an irregular heartbeat, describing it as 'pumping a little hard.'   Discussed the use of AI scribe software for  clinical note transcription with the patient, who gave verbal consent to proceed.   ROS: See HPI       Studies Reviewed: Marland Kitchen        No results found for: "LIPOA"   Risk Assessment/Calculations:     HYPERTENSION CONTROL Vitals:   10/27/23 0751 10/27/23 0934  BP: (!) 170/92 (!) 160/92    The patient's blood pressure is elevated above target  today.  In order to address the patient's elevated BP: Blood pressure will be monitored at home to determine if medication changes need to be made.          Physical Exam:   VS:  BP (!) 160/92   Pulse 72   Ht 5\' 7"  (1.702 m)   Wt 207 lb (93.9 kg)   SpO2 96%   BMI 32.42 kg/m    Wt Readings from Last 3 Encounters:  10/27/23 207 lb (93.9 kg)  08/25/23 210 lb 12.8 oz (95.6 kg)  04/18/23 200 lb 4.8 oz (90.9 kg)    GEN: Well nourished, well developed in no acute distress NECK: No JVD; No carotid bruits CARDIAC: RRR, holosystolic murmur. No rubs, gallops RESPIRATORY:  Clear to auscultation without rales, wheezing or rhonchi  ABDOMEN: Soft, non-tender, non-distended EXTREMITIES:  No edema; No deformity     ASSESSMENT AND PLAN: .    Aneurysm of sinus of valsalva/S/p AVR: Found to have aortic to PA fistula and pseudoaneurysm felt to be extremely high risk for surgical repair. He has opted for continued surveillance. He reports that he continues to feel well and would like to continue with regular surveillance.  Coronary CT and echo completed August 2024 reveals stable findings and heart function. Plan to repeat testing in 1 year. He continues to have DOE that he feels is stable. He has developed LE edema felt to be 2/2 to high sodium diet and sedentary lifestyle. Lifestyle modification advised.  Continue routine follow-up.  Hypertension: BP is elevated initially and remains elevated on my recheck. He reports home BP 135-150 over 75-80.  We have given him guidelines for monitoring at home and we will call in 1 week to get his report. Scr 1.62 on labs completed 08/25/23. Consider hydralazine if BP consistently > 140/80.   Chronic HFpEF: NYHA Class II symptoms with chronic exertional dyspnea that he feels is stable.  Increase in LE edema over the past few weeks. Weight is stable. He denies orthopnea, PND. Admits to high sodium diet and frequently sitting with legs dependent.  Low-sodium, heart  healthy diet encouraged along with leg elevation. We will continue GDMT including lisinopril, spironolactone, torsemide, and labetalol.  CKD Stage 3b: Labs completed 08/25/2023 reveals SCR 1.62, eGFR 44. We will consider this in management of hypertension avoiding higher doses of nephrotoxic medications.   CAD: Nonobstructive CAD on cath 01/2020. He has some shortness of breath with walking long distance. He feels this is stable and has not worsened recently. He denies chest pain or other symptoms concerning for angina.  No indication for further ischemic evaluation at this time.  No bleeding concerns.  Continue aspirin, atorvastatin, spironolactone, lisinopril, labetalol.  Hyperlipidemia LDL Goal < 70: Lipid panel completed 04/08/2023 with total cholesterol 169, HDL 56, LDL 88, triglycerides 119.  He is on high intensity statin atorvastatin 80 mg daily. I am hesitant to increase his pill burden as he commented on high pill burden. Recommended low sodium heart healthy diet and to increase physical activity around or outside of his home.  Continue atorvastatin.       Disposition:6 months with Dr. Excell Seltzer  Signed, Eligha Bridegroom, NP-C

## 2023-10-27 ENCOUNTER — Ambulatory Visit: Payer: Medicare HMO | Attending: Nurse Practitioner | Admitting: Nurse Practitioner

## 2023-10-27 ENCOUNTER — Encounter: Payer: Self-pay | Admitting: Nurse Practitioner

## 2023-10-27 VITALS — BP 160/92 | HR 72 | Ht 67.0 in | Wt 207.0 lb

## 2023-10-27 DIAGNOSIS — I10 Essential (primary) hypertension: Secondary | ICD-10-CM

## 2023-10-27 DIAGNOSIS — Z952 Presence of prosthetic heart valve: Secondary | ICD-10-CM

## 2023-10-27 DIAGNOSIS — I5032 Chronic diastolic (congestive) heart failure: Secondary | ICD-10-CM | POA: Diagnosis not present

## 2023-10-27 DIAGNOSIS — N1832 Chronic kidney disease, stage 3b: Secondary | ICD-10-CM | POA: Diagnosis not present

## 2023-10-27 DIAGNOSIS — E785 Hyperlipidemia, unspecified: Secondary | ICD-10-CM

## 2023-10-27 DIAGNOSIS — I2510 Atherosclerotic heart disease of native coronary artery without angina pectoris: Secondary | ICD-10-CM | POA: Diagnosis not present

## 2023-10-27 DIAGNOSIS — Q2543 Congenital aneurysm of aorta: Secondary | ICD-10-CM | POA: Diagnosis not present

## 2023-10-27 NOTE — Patient Instructions (Signed)
 Medication Instructions:   Your physician recommends that you continue on your current medications as directed. Please refer to the Current Medication list given to you today.   *If you need a refill on your cardiac medications before your next appointment, please call your pharmacy*   Lab Work:  None ordered.  If you have labs (blood work) drawn today and your tests are completely normal, you will receive your results only by: MyChart Message (if you have MyChart) OR A paper copy in the mail If you have any lab test that is abnormal or we need to change your treatment, we will call you to review the results.   Testing/Procedures:  None ordered.   Follow-Up: At [facility], you and your health needs are our priority.  As part of our continuing mission to provide you with exceptional heart care, we have created designated Provider Care Teams.  These Care Teams include your primary Cardiologist (physician) and Advanced Practice Providers (APPs -  Physician Assistants and Nurse Practitioners) who all work together to provide you with the care you need, when you need it.  We recommend signing up for the patient portal called "MyChart".  Sign up information is provided on this After Visit Summary.  MyChart is used to connect with patients for Virtual Visits (Telemedicine).  Patients are able to view lab/test results, encounter notes, upcoming appointments, etc.  Non-urgent messages can be sent to your provider as well.   To learn more about what you can do with MyChart, go to [email or web address].    Your next appointment:   6 month(s)  Provider:   Tonny Bollman, MD     Other Instructions  Your physician wants you to follow-up in: 6 months.  You will receive a reminder letter in the mail two months in advance. If you don't receive a letter, please call our office to schedule the follow-up appointment.  DASH Eating Plan DASH stands for Dietary Approaches to Stop  Hypertension. The DASH eating plan is a healthy eating plan that has been shown to: Lower high blood pressure (hypertension). Reduce your risk for type 2 diabetes, heart disease, and stroke. Help with weight loss. What are tips for following this plan? Reading food labels Check food labels for the amount of salt (sodium) per serving. Choose foods with less than 5 percent of the Daily Value (DV) of sodium. In general, foods with less than 300 milligrams (mg) of sodium per serving fit into this eating plan. To find whole grains, look for the word "whole" as the first word in the ingredient list. Shopping Buy products labeled as "low-sodium" or "no salt added." Buy fresh foods. Avoid canned foods and pre-made or frozen meals. Cooking Try not to add salt when you cook. Use salt-free seasonings or herbs instead of table salt or sea salt. Check with your health care provider or pharmacist before using salt substitutes. Do not fry foods. Cook foods in healthy ways, such as baking, boiling, grilling, roasting, or broiling. Cook using oils that are good for your heart. These include olive, canola, avocado, soybean, and sunflower oil. Meal planning  Eat a balanced diet. This should include: 4 or more servings of fruits and 4 or more servings of vegetables each day. Try to fill half of your plate with fruits and vegetables. 6-8 servings of whole grains each day. 6 or less servings of lean meat, poultry, or fish each day. 1 oz is 1 serving. A 3 oz (85 g) serving of meat  is about the same size as the palm of your hand. One egg is 1 oz (28 g). 2-3 servings of low-fat dairy each day. One serving is 1 cup (237 mL). 1 serving of nuts, seeds, or beans 5 times each week. 2-3 servings of heart-healthy fats. Healthy fats called omega-3 fatty acids are found in foods such as walnuts, flaxseeds, fortified milks, and eggs. These fats are also found in cold-water fish, such as sardines, salmon, and mackerel. Limit  how much you eat of: Canned or prepackaged foods. Food that is high in trans fat, such as fried foods. Food that is high in saturated fat, such as fatty meat. Desserts and other sweets, sugary drinks, and other foods with added sugar. Full-fat dairy products. Do not salt foods before eating. Do not eat more than 4 egg yolks a week. Try to eat at least 2 vegetarian meals a week. Eat more home-cooked food and less restaurant, buffet, and fast food. Lifestyle When eating at a restaurant, ask if your food can be made with less salt or no salt. If you drink alcohol: Limit how much you have to: 0-1 drink a day if you are male. 0-2 drinks a day if you are male. Know how much alcohol is in your drink. In the [place] one drink is one 12 oz bottle of beer (355 mL), one 5 oz glass of wine (148 mL), or one 1 oz glass of hard liquor (44 mL). General information Avoid eating more than 2,300 mg of salt a day. If you have hypertension, you may need to reduce your sodium intake to 1,500 mg a day. Work with your provider to stay at a healthy body weight or lose weight. Ask what the best weight range is for you. On most days of the week, get at least 30 minutes of exercise that causes your heart to beat faster. This may include walking, swimming, or biking. Work with your provider or dietitian to adjust your eating plan to meet your specific calorie needs. What foods should I eat? Fruits All fresh, dried, or frozen fruit. Canned fruits that are in their natural juice and do not have sugar added to them. Vegetables Fresh or frozen vegetables that are raw, steamed, roasted, or grilled. Low-sodium or reduced-sodium tomato and vegetable juice. Low-sodium or reduced-sodium tomato sauce and tomato paste. Low-sodium or reduced-sodium canned vegetables. Grains Whole-grain or whole-wheat bread. Whole-grain or whole-wheat pasta. Brown rice. Orpah Cobb. Bulgur. Whole-grain and low-sodium cereals. Pita bread.  Low-fat, low-sodium crackers. Whole-wheat flour tortillas. Meats and other proteins Skinless chicken or [place]. Ground chicken or [place]. Pork with fat trimmed off. Fish and seafood. Egg whites. Dried beans, peas, or lentils. Unsalted nuts, nut butters, and seeds. Unsalted canned beans. Lean cuts of beef with fat trimmed off. Low-sodium, lean precooked or cured meat, such as sausages or meat loaves. Dairy Low-fat (1%) or fat-free (skim) milk. Reduced-fat, low-fat, or fat-free cheeses. Nonfat, low-sodium ricotta or cottage cheese. Low-fat or nonfat yogurt. Low-fat, low-sodium cheese. Fats and oils Soft margarine without trans fats. Vegetable oil. Reduced-fat, low-fat, or light mayonnaise and salad dressings (reduced-sodium). Canola, safflower, olive, avocado, soybean, and sunflower oils. Avocado. Seasonings and condiments Herbs. Spices. Seasoning mixes without salt. Other foods Unsalted popcorn and pretzels. Fat-free sweets. The items listed above may not be all the foods and drinks you can have. Talk to a dietitian to learn more. What foods should I avoid? Fruits Canned fruit in a light or heavy syrup. Fried fruit. Fruit in cream  or butter sauce. Vegetables Creamed or fried vegetables. Vegetables in a cheese sauce. Regular canned vegetables that are not marked as low-sodium or reduced-sodium. Regular canned tomato sauce and paste that are not marked as low-sodium or reduced-sodium. Regular tomato and vegetable juices that are not marked as low-sodium or reduced-sodium. Rosita Fire. Olives. Grains Baked goods made with fat, such as croissants, muffins, or some breads. Dry pasta or rice meal packs. Meats and other proteins Fatty cuts of meat. Ribs. Fried meat. Tomasa Blase. Bologna, salami, and other precooked or cured meats, such as sausages or meat loaves, that are not lean and low in sodium. Fat from the back of a pig (fatback). Bratwurst. Salted nuts and seeds. Canned beans with added salt. Canned or  smoked fish. Whole eggs or egg yolks. Chicken or [place] with skin. Dairy Whole or 2% milk, cream, and half-and-half. Whole or full-fat cream cheese. Whole-fat or sweetened yogurt. Full-fat cheese. Nondairy creamers. Whipped toppings. Processed cheese and cheese spreads. Fats and oils Butter. Stick margarine. Lard. Shortening. Ghee. Bacon fat. Tropical oils, such as coconut, palm kernel, or palm oil. Seasonings and condiments Onion salt, garlic salt, seasoned salt, table salt, and sea salt. Worcestershire sauce. Tartar sauce. Barbecue sauce. Teriyaki sauce. Soy sauce, including reduced-sodium soy sauce. Steak sauce. Canned and packaged gravies. Fish sauce. Oyster sauce. Cocktail sauce. Store-bought horseradish. Ketchup. Mustard. Meat flavorings and tenderizers. Bouillon cubes. Hot sauces. Pre-made or packaged marinades. Pre-made or packaged taco seasonings. Relishes. Regular salad dressings. Other foods Salted popcorn and pretzels. The items listed above may not be all the foods and drinks you should avoid. Talk to a dietitian to learn more. Where to find more information National Heart, Lung, and Blood Institute (NHLBI): BuffaloDryCleaner.gl American Heart Association (AHA): heart.org Academy of Nutrition and Dietetics: eatright.org National Kidney Foundation (NKF): kidney.org This information is not intended to replace advice given to you by your health care provider. Make sure you discuss any questions you have with your health care provider. Document Revised: 08/26/2022 Document Reviewed: 08/26/2022 Elsevier Patient Education  2024 Elsevier Inc.  HOW TO TAKE YOUR BLOOD PRESSURE  Rest 5 minutes before taking your blood pressure. Don't  smoke or drink caffeinated beverages for at least 30 minutes before. Take your blood pressure before (not after) you eat. Sit comfortably with your back supported and both feet on the floor ( don't cross your legs). Elevate your arm to heart level on a table or a  desk. Use the proper sized cuff.  It should fit smoothly and snugly around your bare upper arm.  There should be  Enough room to slip a fingertip under the cuff.  The bottom edge of the cuff should be 1 inch above the crease Of the elbow. Please monitor your blood pressure once daily 2 hours after your am medication. If you blood pressure Consistently remains above 140 (systolic) top number or over 80 ( diastolic) bottom number X 3 days  Consecutively.  Please call our office at [phone] or send Mychart message.  I will call you in one week to get your bp readings and your heart rate.    ----Avoid cold medicines with D or DM at the end of them----    1st Floor: - Lobby - Registration  - Pharmacy  - Lab - Cafe  2nd Floor: - PV Lab - Diagnostic Testing (echo, CT, nuclear med)  3rd Floor: - Vacant  4th Floor: - TCTS (cardiothoracic surgery) - AFib Clinic - Structural Heart Clinic - Vascular Surgery  -  Vascular Ultrasound  5th Floor: - HeartCare Cardiology (general and EP) - Clinical Pharmacy for coumadin, hypertension, lipid, weight-loss medications, and med management appointments    Valet parking services will be available as well.

## 2023-11-01 DIAGNOSIS — H40013 Open angle with borderline findings, low risk, bilateral: Secondary | ICD-10-CM | POA: Diagnosis not present

## 2023-11-01 LAB — HM DIABETES EYE EXAM

## 2023-11-03 ENCOUNTER — Telehealth: Payer: Self-pay | Admitting: *Deleted

## 2023-11-03 DIAGNOSIS — I10 Essential (primary) hypertension: Secondary | ICD-10-CM

## 2023-11-03 NOTE — Telephone Encounter (Signed)
-----   Message from [facility] Addis G sent at 10/27/2023  8:14 AM EST ----- Call pt in one week to get BP readings.

## 2023-11-03 NOTE — Telephone Encounter (Signed)
 Pt is not at home to give BP and HR readings.  Will call pt tomorrow at 8:00 am.

## 2023-11-04 NOTE — Telephone Encounter (Signed)
 S/w pt to get BP/HR readings for the week. Readings are as follows:   BP  HR  3/7   164/96   62 3/7 138/60  63 3/8 154/77  65 3/8 121/67  67 3/9 158/84  62 3/10 153/83  57 3/11 149/78  65 3/12 156/76  62 3/12 125/72  59 3/13 159/85  62 3/13 136/63  60 3/14 134/75  66  Advised pt only take BP/HR once daily, two hours after medication.  Pt will continue to monitor and advised will be in touch after [facility] .

## 2023-11-08 NOTE — Telephone Encounter (Signed)
 Blood pressures are mostly above goal. Would recommend that he increase lisinopril to 5 mg daily and have BMET in 2 weeks. Continue to monitor BP and HR after this change and report back 2 weeks later.

## 2023-11-09 ENCOUNTER — Other Ambulatory Visit (HOSPITAL_BASED_OUTPATIENT_CLINIC_OR_DEPARTMENT_OTHER): Payer: Self-pay | Admitting: *Deleted

## 2023-11-09 DIAGNOSIS — I10 Essential (primary) hypertension: Secondary | ICD-10-CM

## 2023-11-09 DIAGNOSIS — E118 Type 2 diabetes mellitus with unspecified complications: Secondary | ICD-10-CM | POA: Diagnosis not present

## 2023-11-09 MED ORDER — LISINOPRIL 2.5 MG PO TABS
5.0000 mg | ORAL_TABLET | Freq: Every day | ORAL | Status: DC
Start: 2023-11-09 — End: 2024-05-09

## 2023-11-09 NOTE — Addendum Note (Signed)
 Addended by: Brien Mates R on: 11/09/2023 12:41 PM   Modules accepted: Orders

## 2023-11-09 NOTE — Telephone Encounter (Signed)
 S/w pt is aware of recommendations. Pt will increase lisinopril two (2) tablets by mouth ( 5 mg) daily, medication list updated. Placed order bmet in two weeks and released. Pt will continue to record BP and HR once daily and will call pt back in two weeks for recorded pressures.Reminder in system.  Pt repeated back all instructions.

## 2023-11-09 NOTE — Telephone Encounter (Signed)
 Pt returning call, requesting cb

## 2023-11-09 NOTE — Telephone Encounter (Signed)
 Lvm for pt to call office to discuss recommendations.

## 2023-11-15 ENCOUNTER — Other Ambulatory Visit (HOSPITAL_COMMUNITY): Payer: Self-pay

## 2023-11-17 ENCOUNTER — Other Ambulatory Visit: Payer: Self-pay

## 2023-11-17 ENCOUNTER — Other Ambulatory Visit (HOSPITAL_COMMUNITY): Payer: Self-pay

## 2023-11-17 NOTE — Progress Notes (Signed)
 Specialty Pharmacy Refill Coordination Note  Matthew Serrano. is a 78 y.o. male contacted today regarding refills of specialty medication(s) Abiraterone Acetate [name])   Patient requested Delivery   Delivery date: 11/22/23   Verified address: [address]   [place] [address]   Medication will be filled on 11/21/23.

## 2023-11-18 ENCOUNTER — Ambulatory Visit: Payer: Medicare PPO

## 2023-11-18 VITALS — Ht 67.0 in | Wt 207.0 lb

## 2023-11-18 DIAGNOSIS — Z0000 Encounter for general adult medical examination without abnormal findings: Secondary | ICD-10-CM

## 2023-11-18 NOTE — Patient Instructions (Addendum)
 Mr. Matthew Serrano , Thank you for taking time to come for your Medicare Wellness Visit. I appreciate your ongoing commitment to your health goals. Please review the following plan we discussed and let me know if I can assist you in the future.   Referrals/Orders/Follow-Ups/Clinician Recommendations: Follow-up with Lab appointment  This is a list of the screening recommended for you and due dates:  Health Maintenance  Topic Date Due   DTaP/Tdap/Td vaccine (3 - Tdap) 01/11/2017   COVID-19 Vaccine (4 - 2024-25 season) 04/24/2023   Colon Cancer Screening  05/04/2023   Hemoglobin A1C  10/09/2023   Yearly kidney health urinalysis for diabetes  01/07/2024   Complete foot exam   04/07/2024   Yearly kidney function blood test for diabetes  08/24/2024   Eye exam for diabetics  10/31/2024   Medicare Annual Wellness Visit  11/17/2024   Pneumonia Vaccine  Completed   Flu Shot  Completed   Hepatitis C Screening  Completed   Zoster (Shingles) Vaccine  Completed   HPV Vaccine  Aged Out    Advanced directives: (In Chart) A copy of your advanced directives are scanned into your chart should your provider ever need it.  Next Medicare Annual Wellness Visit scheduled for next year: Yes

## 2023-11-18 NOTE — Progress Notes (Signed)
 Subjective:   Matthew Serrano. is a 78 y.o. who presents for a Medicare Wellness preventive visit.  Visit Complete: Virtual I connected with  [name]. on 11/18/23 by a audio enabled telemedicine application and verified that I am speaking with the correct person using two identifiers.  Patient Location: Home  Provider Location: Home Office  I discussed the limitations of evaluation and management by telemedicine. The patient expressed understanding and agreed to proceed.  Vital Signs: Because this visit was a virtual/telehealth visit, some criteria may be missing or patient reported. Any vitals not documented were not able to be obtained and vitals that have been documented are patient reported.  VideoDeclined- This patient declined Librarian, academic. Therefore the visit was completed with audio only.  Persons Participating in Visit: Patient.  AWV Questionnaire: Yes: Patient Medicare AWV questionnaire was completed by the patient on 11/17/23; I have confirmed that all information answered by patient is correct and no changes since this date.  Cardiac Risk Factors include: advanced age (>59men, >68 women);diabetes mellitus;male gender;hypertension     Objective:    Today's Vitals   11/18/23 1322  Weight: 207 lb (93.9 kg)  Height: 5\' 7"  (1.702 m)   Body mass index is 32.42 kg/m.     11/18/2023    1:31 PM 11/10/2022    9:57 AM 03/03/2022    8:53 AM 11/02/2021    2:35 PM 11/12/2020    9:14 AM 05/13/2020    1:51 PM 05/13/2020   11:15 AM  Advanced Directives  Does Patient Have a Medical Advance Directive? Yes Yes No Yes Yes No No  Type of Estate agent of [place];Living will Healthcare Power of [place];Living will  Out of facility DNR (pink MOST or yellow form) Healthcare Power of [place];Living will    Does patient want to make changes to medical advance directive? No - Patient declined No - Patient declined   No -  Patient declined    Copy of Healthcare Power of Attorney in Chart? Yes - validated most recent copy scanned in chart (See row information) Yes - validated most recent copy scanned in chart (See row information)   No - copy requested    Would patient like information on creating a medical advance directive?      No - Patient declined     Current Medications (verified) Outpatient Encounter Medications as of 11/18/2023  Medication Sig   abiraterone acetate (ZYTIGA) 250 MG tablet Take 4 tablets (1,000 mg total) by mouth daily. Take on an empty stomach 1 hour before or 2 hours after a meal   Alcohol Swabs (DROPSAFE ALCOHOL PREP) 70 % PADS AS DIRECTED   aspirin EC 81 MG tablet Take 81 mg by mouth daily. Swallow whole.   atorvastatin (LIPITOR) 80 MG tablet TAKE 1 TABLET EVERY DAY   Blood Glucose Calibration (TRUE METRIX LEVEL 1) Low SOLN USE AS DIRECTED WITH GLUCOSE METER   calcium carbonate (CALCIUM 600) 1500 (600 Ca) MG TABS tablet Take 0.8 tablets (1,200 mg total) by mouth daily.   cholecalciferol (VITAMIN D3) 25 MCG (1000 UT) tablet Take 1,000 Units by mouth daily.   ferrous sulfate 325 (65 FE) MG EC tablet Take 1 tablet (325 mg total) by mouth 2 (two) times daily before a meal.   glipiZIDE (GLUCOTROL XL) 10 MG 24 hr tablet TAKE 1 TABLET TWICE DAILY   labetalol (NORMODYNE) 200 MG tablet TAKE 3 TABLETS EVERY MORNING AND TAKE 2 TABLETS  EVERY EVENING   levothyroxine (SYNTHROID) 125 MCG tablet TAKE 1 TABLET EVERY DAY   lisinopril (ZESTRIL) 2.5 MG tablet Take 2 tablets (5 mg total) by mouth daily.   metFORMIN (GLUCOPHAGE) 1000 MG tablet Take 1 tablet (1,000 mg total) by mouth 2 (two) times daily with a meal.   omeprazole (PRILOSEC) 40 MG capsule TAKE 1 CAPSULE EVERY DAY   potassium chloride SA (KLOR-CON M) 20 MEQ tablet Take 1 tablet (20 mEq total) by mouth daily.   predniSONE (DELTASONE) 5 MG tablet Take 1 tablet (5 mg total) by mouth daily with breakfast.   spironolactone (ALDACTONE) 25 MG tablet  TAKE 1 TABLET EVERY OTHER DAY   torsemide (DEMADEX) 20 MG tablet TAKE 1 TABLET EVERY DAY   No facility-administered encounter medications on file as of 11/18/2023.    Allergies (verified) Patient has no known allergies.   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   23 mm human aortic root graft with 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 and reimplantation of left main and right coronary arteries   Severe aortic insufficiency 05/13/2020   acute onset in setting of bacterial endocarditis   Severe aortic stenosis    Past Surgical History:  Procedure Laterality Date   AORTIC VALVE REPLACEMENT N/A 05/15/2020   Procedure: AORTIC VALVE REPLACEMENT (AVR) SIZE 23 MM,  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;  Surgeon: Purcell Nails, MD;  Location: [facility] OR;  Service: Open Heart Surgery;  Laterality: N/A;   ASCENDING AORTIC ROOT REPLACEMENT N/A 05/15/2020   Procedure: HUMAN ALLOGRAFT AORTIC ROOT REPLACEMENT;  Surgeon: Purcell Nails, MD;  Location: [facility] OR;  Service: Open Heart Surgery;  Laterality: N/A;   BUBBLE STUDY  03/03/2022   Procedure: BUBBLE STUDY;  Surgeon: Meriam Sprague, MD;  Location: [facility] ENDOSCOPY;  Service: Cardiovascular;;   COLONOSCOPY     COLONOSCOPY N/A 05/03/2020   Procedure: COLONOSCOPY;  Surgeon: Charna Elizabeth, MD;  Location: [facility] ENDOSCOPY;  Service: Endoscopy;   Laterality: N/A;   COLONOSCOPY W/ BIOPSIES AND POLYPECTOMY     ENTEROSCOPY N/A 05/02/2020   Procedure: ENTEROSCOPY;  Surgeon: Meridee Score Netty Starring., MD;  Location: [facility] ENDOSCOPY;  Service: Gastroenterology;  Laterality: N/A;   HEMOSTASIS CLIP PLACEMENT  05/03/2020   Procedure: HEMOSTASIS CLIP PLACEMENT;  Surgeon: Charna Elizabeth, MD;  Location: [facility] ENDOSCOPY;  Service: Endoscopy;;   HOT HEMOSTASIS N/A 05/03/2020   Procedure: HOT HEMOSTASIS (ARGON PLASMA COAGULATION/BICAP);  Surgeon: Charna Elizabeth, MD;  Location: [facility] ENDOSCOPY;  Service: Endoscopy;  Laterality: N/A;   IR IMAGING GUIDED PORT INSERTION  07/05/2018   POLYPECTOMY     PORT-A-CATH REMOVAL Right 05/20/2020   Procedure: REMOVAL PORT-A-CATH;  Surgeon: Purcell Nails, MD;  Location: [facility] OR;  Service: Thoracic;  Laterality: Right;   RIGHT/LEFT HEART CATH AND CORONARY ANGIOGRAPHY N/A 02/13/2020   Procedure: RIGHT/LEFT  HEART CATH AND CORONARY ANGIOGRAPHY;  Surgeon: Lyn Records, MD;  Location: [facility] INVASIVE CV LAB;  Service: Cardiovascular;  Laterality: N/A;   SUBMUCOSAL INJECTION  05/02/2020   Procedure: SUBMUCOSAL INJECTION;  Surgeon: Meridee Score Netty Starring., MD;  Location: [facility] ENDOSCOPY;  Service: Gastroenterology;;   TEE WITHOUT CARDIOVERSION N/A 05/14/2020   Procedure: TRANSESOPHAGEAL ECHOCARDIOGRAM (TEE);  Surgeon: Thurmon Fair, MD;  Location: [facility] ENDOSCOPY;  Service: Cardiovascular;  Laterality: N/A;   TEE WITHOUT CARDIOVERSION N/A 05/15/2020   Procedure: TRANSESOPHAGEAL ECHOCARDIOGRAM (TEE);  Surgeon: Purcell Nails, MD;  Location: [facility] OR;  Service: Open Heart Surgery;  Laterality: N/A;   TEE WITHOUT CARDIOVERSION N/A 03/03/2022   Procedure: TRANSESOPHAGEAL ECHOCARDIOGRAM (TEE);  Surgeon: Meriam Sprague, MD;  Location: [facility] ENDOSCOPY;  Service: Cardiovascular;  Laterality: N/A;   Family History  Problem Relation Age of Onset   Aneurysm Mother        Brain aneurysm    Breast cancer Sister    Prostate cancer Brother    Hypertension  Other    Breast cancer Other        family history   Colon cancer Neg Hx    Esophageal cancer Neg Hx    Stomach cancer Neg Hx    Rectal cancer Neg Hx    Social History   Socioeconomic History   Marital status: Widowed    Spouse name: Not on file   Number of children: 0   Years of education: Not on file   Highest education level: Not on file  Occupational History   Not on file  Tobacco Use   Smoking status: Former   Smokeless tobacco: Never   Tobacco comments:    quit 2005  Vaping Use   Vaping status: Never Used  Substance and Sexual Activity   Alcohol use: Not Currently    Alcohol/week: 14.0 standard drinks of alcohol    Types: 14 Cans of beer per week    Comment: stopped in 07/2022   Drug use: No   Sexual activity: Not on file  Other Topics Concern   Not on file  Social History Narrative   He works as a Estate agent    Not married    No kids          Social Drivers of Corporate investment banker Strain: Low Risk  (11/18/2023)   Overall Financial Resource Strain (CARDIA)    Difficulty of Paying Living Expenses: Not hard at all  Food Insecurity: No Food Insecurity (11/18/2023)   Hunger Vital Sign    Worried About Running Out of Food in the Last Year: Never true    Ran Out of Food in the Last Year: Never true  Transportation Needs: No Transportation Needs (11/18/2023)   PRAPARE - Administrator, Civil Service (Medical): No    Lack of Transportation (Non-Medical): No  Physical Activity: Insufficiently Active (11/18/2023)   Exercise Vital Sign    Days of Exercise per Week: 2 days    Minutes of Exercise per Session: 20 min  Stress: No Stress Concern Present (11/18/2023)   [employer] of Occupational Health - Occupational Stress Questionnaire    Feeling of Stress : Not at all  Social Connections: Socially Isolated (11/18/2023)   Social Connection and Isolation Panel [NHANES]    Frequency of Communication with Friends and Family: More than three  times a week    Frequency of Social Gatherings with Friends and Family: More than three times a week  Attends Religious Services: Never    Active Member of Clubs or Organizations: No    Attends Banker Meetings: Never    Marital Status: Widowed    Tobacco Counseling Counseling given: Not Answered Tobacco comments: quit 2005    Clinical Intake:  Pre-visit preparation completed: Yes  Pain : No/denies pain     BMI - recorded: 32.42 Nutritional Status: BMI > 30  Obese Nutritional Risks: None Diabetes: Yes CBG done?: Yes (CBG 119 Per patient) CBG resulted in Enter/ Edit results?: Yes Did pt. bring in CBG monitor from home?: No  Lab Results  Component Value Date   HGBA1C 6.4 04/08/2023   HGBA1C 6.2 (A) 01/06/2023   HGBA1C 6.1 (H) 02/26/2022     How often do you need to have someone help you when you read instructions, pamphlets, or other written materials from your doctor or pharmacy?: 1 - Never  Interpreter Needed?: No  Information entered by :: Theresa Mulligan LPN   Activities of Daily Living     11/18/2023    1:31 PM 11/17/2023    3:40 PM  In your present state of health, do you have any difficulty performing the following activities:  Hearing? 0 0  Vision? 0 0  Difficulty concentrating or making decisions? 0 0  Walking or climbing stairs? 0 0  Dressing or bathing? 0 0  Doing errands, shopping? 0 0  Preparing Food and eating ? N N  Using the Toilet? N N  In the past six months, have you accidently leaked urine? N N  Do you have problems with loss of bowel control? N N  Managing your Medications? N N  Managing your Finances? N   Housekeeping or managing your Housekeeping? N N    Patient Care Team: Shirline Frees, NP as PCP - General (Family Medicine) Tonny Bollman, MD as PCP - Cardiology (Cardiology) Crist Fat, MD as Attending Physician (Urology) Kennon Rounds as Physician Assistant (Cardiology) Malachy Mood, MD as  Attending Physician (Hematology and Oncology) Sherrill Raring, [facility] (Pharmacist)  Indicate any recent Medical Services you may have received from other than [facility] providers in the past year (date may be approximate).     Assessment:   This is a routine wellness examination for [place].  Hearing/Vision screen Hearing Screening - Comments:: Denies hearing difficulties   Vision Screening - Comments:: Wears reading glasses - up to date with routine eye exams with  [facility]   Goals Addressed               This Visit's Progress     Increase physical activity (pt-stated)         Depression Screen     11/18/2023    1:28 PM 04/08/2023    8:07 AM 11/10/2022    9:53 AM 07/30/2022    9:45 AM 05/17/2022    9:04 AM 04/06/2022    9:41 AM 11/02/2021    2:37 PM  PHQ 2/9 Scores  PHQ - 2 Score 0 2 0 0 0 0 0  PHQ- 9 Score  4         Fall Risk     11/18/2023    1:31 PM 11/17/2023    3:40 PM 04/08/2023    8:07 AM 11/10/2022    9:55 AM 05/17/2022    9:03 AM  Fall Risk   Falls in the past year? 1 1 0 0 0  Number falls in past yr: 0  0 0 0  Injury with Fall? 0  0 0 0  Risk for fall due to : No Fall Risks  No Fall Risks No Fall Risks No Fall Risks  Follow up Falls prevention discussed;Falls evaluation completed  Falls evaluation completed Falls prevention discussed Falls evaluation completed    MEDICARE RISK AT HOME:  Medicare Risk at Home Any stairs in or around the home?: Yes If so, are there any without handrails?: No Home free of loose throw rugs in walkways, pet beds, electrical cords, etc?: Yes Adequate lighting in your home to reduce risk of falls?: Yes Life alert?: No Use of a cane, walker or w/c?: Yes Grab bars in the bathroom?: Yes Shower chair or bench in shower?: No Elevated toilet seat or a handicapped toilet?: No  TIMED UP AND GO:  Was the test performed?  No  Cognitive Function: 6CIT completed        11/18/2023    1:32 PM 11/10/2022    9:57 AM 11/02/2021     2:38 PM  6CIT Screen  What Year? 0 points 0 points 0 points  What month? 0 points 0 points 0 points  What time? 0 points 0 points 0 points  Count back from 20 0 points 0 points 0 points  Months in reverse 0 points 0 points 0 points  Repeat phrase 0 points 2 points 2 points  Total Score 0 points 2 points 2 points    Immunizations Immunization History  Administered Date(s) Administered   Fluad Quad(high Dose 65+) 07/26/2019, 07/29/2020, 07/30/2021, 05/20/2022   Influenza Split 07/12/2011   Influenza Whole 05/23/2008, 06/13/2009, 05/11/2010   Influenza, High Dose Seasonal PF 05/03/2016, 05/24/2018   Influenza, Seasonal, Injecte, Preservative Fre 07/29/2023   Influenza,inj,Quad PF,6+ Mos 05/16/2014   PFIZER(Purple Top)SARS-COV-2 Vaccination 10/21/2019, 11/20/2019, 07/25/2020   Pneumococcal Conjugate-13 11/13/2013   Pneumococcal Polysaccharide-23 06/02/2000, 12/24/2004, 05/03/2016   Td 01/05/1996, 01/12/2007   Zoster Recombinant(Shingrix) 01/06/2022, 04/08/2022    Screening Tests Health Maintenance  Topic Date Due   DTaP/Tdap/Td (3 - Tdap) 01/11/2017   COVID-19 Vaccine (4 - 2024-25 season) 04/24/2023   Colonoscopy  05/04/2023   HEMOGLOBIN A1C  10/09/2023   Diabetic kidney evaluation - Urine ACR  01/07/2024   FOOT EXAM  04/07/2024   Diabetic kidney evaluation - eGFR measurement  08/24/2024   OPHTHALMOLOGY EXAM  10/31/2024   Medicare Annual Wellness (AWV)  11/17/2024   Pneumonia Vaccine 36+ Years old  Completed   INFLUENZA VACCINE  Completed   Hepatitis C Screening  Completed   Zoster Vaccines- Shingrix  Completed   HPV VACCINES  Aged Out    Health Maintenance  Health Maintenance Due  Topic Date Due   DTaP/Tdap/Td (3 - Tdap) 01/11/2017   COVID-19 Vaccine (4 - 2024-25 season) 04/24/2023   Colonoscopy  05/04/2023   HEMOGLOBIN A1C  10/09/2023   Health Maintenance Items Addressed:    Additional Screening:  Vision Screening: Recommended annual ophthalmology exams for  early detection of glaucoma and other disorders of the eye.  Dental Screening: Recommended annual dental exams for proper oral hygiene  Community Resource Referral / Chronic Care Management: CRR required this visit?  No   CCM required this visit?  No     Plan:     I have personally reviewed and noted the following in the patient's chart:   Medical and social history Use of alcohol, tobacco or illicit drugs  Current medications and supplements including opioid prescriptions. Patient is not currently taking opioid prescriptions. Functional ability and status Nutritional  status Physical activity Advanced directives List of other physicians Hospitalizations, surgeries, and ER visits in previous 12 months Vitals Screenings to include cognitive, depression, and falls Referrals and appointments  In addition, I have reviewed and discussed with patient certain preventive protocols, quality metrics, and best practice recommendations. A written personalized care plan for preventive services as well as general preventive health recommendations were provided to patient.     Tillie Rung, LPN   1/61/0960   After Visit Summary: (MyChart) Due to this being a telephonic visit, the after visit summary with patients personalized plan was offered to patient via MyChart   Notes: Nothing significant to report at this time.

## 2023-11-22 DIAGNOSIS — E118 Type 2 diabetes mellitus with unspecified complications: Secondary | ICD-10-CM | POA: Diagnosis not present

## 2023-11-23 ENCOUNTER — Telehealth (HOSPITAL_BASED_OUTPATIENT_CLINIC_OR_DEPARTMENT_OTHER): Payer: Self-pay | Admitting: *Deleted

## 2023-11-23 NOTE — Telephone Encounter (Signed)
 Blood pressures are well-controlled.  I do not recommend any additional medication at this time.

## 2023-11-23 NOTE — Telephone Encounter (Signed)
 Pt returning call with readings: Today-122/60 HR 61 4/1-136/66 HR 65 3/31-132/61 HR 65 3/30-132/61 HR 65 3/29-129/71 HR 55 3/27-126/59 HR 67 3/26-137/71 HR 63

## 2023-11-23 NOTE — Telephone Encounter (Signed)
-----   Message from [facility] Danetra Glock G sent at 11/09/2023 12:40 PM EDT ----- Call pt back in two weeks with bp/hr readings.

## 2023-11-23 NOTE — Telephone Encounter (Signed)
 Will forward to Eula Fried NP for review

## 2023-11-23 NOTE — Telephone Encounter (Signed)
 Lvm for pt to call back with updated HR/BP readings.

## 2023-11-30 DIAGNOSIS — I10 Essential (primary) hypertension: Secondary | ICD-10-CM | POA: Diagnosis not present

## 2023-12-01 LAB — BASIC METABOLIC PANEL WITH GFR
BUN/Creatinine Ratio: 23 (ref 10–24)
BUN: 34 mg/dL — ABNORMAL HIGH (ref 8–27)
CO2: 22 mmol/L (ref 20–29)
Calcium: 9 mg/dL (ref 8.6–10.2)
Chloride: 106 mmol/L (ref 96–106)
Creatinine, Ser: 1.5 mg/dL — ABNORMAL HIGH (ref 0.76–1.27)
Glucose: 94 mg/dL (ref 70–99)
Potassium: 4.4 mmol/L (ref 3.5–5.2)
Sodium: 144 mmol/L (ref 134–144)
eGFR: 48 mL/min/{1.73_m2} — ABNORMAL LOW (ref >59)

## 2023-12-09 DIAGNOSIS — E118 Type 2 diabetes mellitus with unspecified complications: Secondary | ICD-10-CM | POA: Diagnosis not present

## 2023-12-14 ENCOUNTER — Other Ambulatory Visit: Payer: Self-pay | Admitting: Adult Health

## 2023-12-14 DIAGNOSIS — E119 Type 2 diabetes mellitus without complications: Secondary | ICD-10-CM

## 2023-12-19 ENCOUNTER — Other Ambulatory Visit: Payer: Self-pay | Admitting: Pharmacy Technician

## 2023-12-19 ENCOUNTER — Other Ambulatory Visit: Payer: Self-pay | Admitting: Adult Health

## 2023-12-19 ENCOUNTER — Other Ambulatory Visit: Payer: Self-pay

## 2023-12-19 DIAGNOSIS — E119 Type 2 diabetes mellitus without complications: Secondary | ICD-10-CM

## 2023-12-19 NOTE — Progress Notes (Signed)
 Specialty Pharmacy Refill Coordination Note  Burech Marchesano. is a 78 y.o. male contacted today regarding refills of specialty medication(s) Abiraterone  Acetate (ZYTIGA )   Patient requested Delivery   Delivery date: 12/27/23   Verified address: [address] [place]    Medication will be filled on 12/26/23.

## 2023-12-22 ENCOUNTER — Other Ambulatory Visit: Payer: Self-pay

## 2023-12-22 DIAGNOSIS — C61 Malignant neoplasm of prostate: Secondary | ICD-10-CM

## 2023-12-22 NOTE — Assessment & Plan Note (Addendum)
-  stage IV with node metastasis, diagnosed in 2019, castration-resistant now  -Initial prostate biopsy obtained on 06/06/2018 which showed high-volume disease with a Gleason score 4+5 = 9. -first line therapy with Taxotere  chemotherapy at 75 mg/m every 3 weeks started on 07/07/2018 and stopped in Feb 2020 after 6 cycles.  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 -currently on Eligard  30 mg every 4 months and Zytiga  1000 mg daily started in April 2020.  His PSA has been undetectable since then  -He is clinically doing very well, asymptomatic, will continue current treatment. -Will obtain a PSMA PET scan if his PSA increases significantly.  Most recent PSA done 04/18/2023 was<0.1. 08/25/2023 -PSA <0.02.  Patient more anemic than normal. Having blood present in stool. Sent message out to GI provider to schedule follow up and possible colonoscopy with him.  - 12/23/2023-most recent PSA <0.02.  Proceed with Eligard  injection of 30 mg and continue every 4 months.  Continue Zytiga  1000 mg daily.  Labs with follow-up in 4 months, sooner if needed.

## 2023-12-22 NOTE — Progress Notes (Signed)
 Patient Care Team: Alto Atta, NP as PCP - General (Family Medicine) Arnoldo Lapping, MD as PCP - Cardiology (Cardiology) Andrez Banker, MD as Attending Physician (Urology) Sherwood Donath as Physician Assistant (Cardiology) Sonja Bantam, MD as Attending Physician (Hematology and Oncology) Carnell Christian, [facility] (Pharmacist)  Clinic Day:  12/23/2023  Referring physician: Alto Atta, NP  ASSESSMENT & PLAN:   Assessment & Plan: Malignant neoplasm of prostate [facility]) -stage IV with node metastasis, diagnosed in 2019, castration-resistant now  -Initial prostate biopsy obtained on 06/06/2018 which showed high-volume disease with a Gleason score 4+5 = 9. -first line therapy with Taxotere  chemotherapy at 75 mg/m every 3 weeks started on 07/07/2018 and stopped in Feb 2020 after 6 cycles.  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 -currently on Eligard  30 mg every 4 months and Zytiga  1000 mg daily started in April 2020.  His PSA has been undetectable since then  -He is clinically doing very well, asymptomatic, will continue current treatment. -Will obtain a PSMA PET scan if his PSA increases significantly.  Most recent PSA done 04/18/2023 was<0.1. 08/25/2023 -PSA <0.02.  Patient more anemic than normal. Having blood present in stool. Sent message out to GI provider to schedule follow up and possible colonoscopy with him.  - 12/23/2023-most recent PSA <0.02.  Proceed with Eligard  injection of 30 mg and continue every 4 months.  Continue Zytiga  1000 mg daily.  Labs with follow-up in 4 months, sooner if needed.    New skin lesion Rough, raised lesion on left medial forearm.  2 to 3 cm in diameter. Referral to dermatology for further evaluation.  Skin rash Small area of what appears to be contact dermatitis on right posterior forearm. Recommend he use hydrocortisone  cream as needed to help with itchiness and irritation. He should  discuss with primary care if no improvement over next several days.  Plan Labs reviewed.  - Mild and stable anemia.  Stable kidney dysfunction. - PSA level pending today.  Based on most recent PSA of <0.02, okay to proceed with Eligard  injection today. Continue Zytiga  1000 mg daily. Proceed with Eligard  30 mg injection today. -Continue with Eligard  injections every 4 months. Labs, follow-up, and Eligard  injection in 4 months.  The patient understands the plans discussed today and is in agreement with them.  He knows to contact our office if he develops concerns prior to his next appointment.  I provided 20 minutes of face-to-face time during this encounter and > 50% was spent counseling as documented under my assessment and plan.    Sharyon Deis, NP  [facility] - A DEPT OF [facility] [address] [place] [address] Dept: [phone] Dept Fax: [phone]   Orders Placed This Encounter  Procedures   Ambulatory referral to Dermatology    Referral Priority:   Routine    Referral Type:   Consultation    Referral Reason:   Specialty Services Required    Requested Specialty:   Dermatology    Number of Visits Requested:   1      CHIEF COMPLAINT:  CC: Prostate cancer  Current Treatment: Eligard  30 mg every 4 months and Zytiga  1000 mg daily  INTERVAL HISTORY:  Baldwin is here today for repeat clinical assessment.  He was last seen by me on 08/25/2023.  PSA at that time was <0.02.  He has noticed a  new skin lesion on his left forearm.  States it started off as flat, but has become taller, rough, and has changed color.  He has also noticed a small, fine, red rash on his right forearm.  This is itchy without definite lesion.  Otherwise, he has no physical concerns or complaints.   He denies chest pain, chest pressure, or shortness of breath. He denies headaches or visual disturbances. He denies abdominal pain,  nausea, vomiting, or changes in bowel or bladder habits.  He denies fevers or chills. He denies pain. His appetite is good. His weight has been stable.  I have reviewed the past medical history, past surgical history, social history and family history with the patient and they are unchanged from previous note.  ALLERGIES:  has no known allergies.  MEDICATIONS:  Current Outpatient Medications  Medication Sig Dispense Refill   abiraterone  acetate (ZYTIGA ) 250 MG tablet Take 4 tablets (1,000 mg total) by mouth daily. Take on an empty stomach 1 hour before or 2 hours after a meal 120 tablet 2   Alcohol Swabs (DROPSAFE ALCOHOL PREP) 70 % PADS AS DIRECTED 300 each 3   aspirin  EC 81 MG tablet Take 81 mg by mouth daily. Swallow whole.     atorvastatin  (LIPITOR ) 80 MG tablet TAKE 1 TABLET EVERY DAY 90 tablet 3   Blood Glucose Calibration (TRUE METRIX LEVEL 1) Low SOLN USE AS DIRECTED WITH GLUCOSE METER 1 each 3   calcium  carbonate (CALCIUM  600) 1500 (600 Ca) MG TABS tablet Take 0.8 tablets (1,200 mg total) by mouth daily. 30 tablet 0   cholecalciferol (VITAMIN D3) 25 MCG (1000 UT) tablet Take 1,000 Units by mouth daily.     ferrous sulfate  325 (65 FE) MG EC tablet Take 1 tablet (325 mg total) by mouth 2 (two) times daily before a meal. 60 tablet 2   glipiZIDE  (GLUCOTROL  XL) 10 MG 24 hr tablet TAKE 1 TABLET TWICE DAILY 180 tablet 0   labetalol  (NORMODYNE ) 200 MG tablet TAKE 3 TABLETS EVERY MORNING AND TAKE 2 TABLETS EVERY EVENING 450 tablet 3   levothyroxine  (SYNTHROID ) 125 MCG tablet TAKE 1 TABLET EVERY DAY 90 tablet 3   lisinopril  (ZESTRIL ) 2.5 MG tablet Take 2 tablets (5 mg total) by mouth daily.     metFORMIN  (GLUCOPHAGE ) 1000 MG tablet TAKE 1 TABLET TWICE DAILY WITH MEALS 180 tablet 3   omeprazole  (PRILOSEC) 40 MG capsule TAKE 1 CAPSULE EVERY DAY 90 capsule 2   potassium chloride  SA (KLOR-CON  M) 20 MEQ tablet Take 1 tablet (20 mEq total) by mouth daily. 90 tablet 3   predniSONE  (DELTASONE ) 5 MG  tablet Take 1 tablet (5 mg total) by mouth daily with breakfast. 90 tablet 3   spironolactone  (ALDACTONE ) 25 MG tablet TAKE 1 TABLET EVERY OTHER DAY 45 tablet 2   torsemide  (DEMADEX ) 20 MG tablet TAKE 1 TABLET EVERY DAY 90 tablet 2   No current facility-administered medications for this visit.    HISTORY OF PRESENT ILLNESS:   Oncology History  Malignant neoplasm of prostate (HCC)  06/23/2018 Initial Diagnosis   Malignant neoplasm of prostate (HCC)   07/07/2018 - 10/20/2018 Chemotherapy   The patient had pegfilgrastim  (NEULASTA  ONPRO KIT) injection 6 mg, 6 mg, Subcutaneous, Once, 5 of 5 cycles Administration: 6 mg (07/28/2018), 6 mg (08/18/2018), 6 mg (09/08/2018), 6 mg (09/29/2018), 6 mg (10/20/2018) pegfilgrastim -cbqv (UDENYCA ) injection 6 mg, 6 mg, Subcutaneous, Once, 1 of 1 cycle Administration: 6 mg (07/08/2018) DOCEtaxel  (TAXOTERE ) 170 mg in sodium chloride   0.9 % 250 mL chemo infusion, 75 mg/m2 = 170 mg, Intravenous,  Once, 6 of 6 cycles Administration: 170 mg (07/07/2018), 170 mg (07/28/2018), 170 mg (08/18/2018), 170 mg (09/08/2018), 170 mg (09/29/2018), 170 mg (10/20/2018)  for chemotherapy treatment.        REVIEW OF SYSTEMS:   Constitutional: Denies fevers, chills or abnormal weight loss Eyes: Denies blurriness of vision Ears, nose, mouth, throat, and face: Denies mucositis or sore throat Respiratory: Denies cough, dyspnea or wheezes Cardiovascular: Denies palpitation, chest discomfort or lower extremity swelling Gastrointestinal:  Denies nausea, heartburn or change in bowel habits Skin: Has small skin rash on right forearm which started earlier this week.  He reports it being itchy.  Has used alcohol to try to help.  Also has a new skin lesion on left forearm which is getting taller, rough in texture, and changing color. Lymphatics: Denies new lymphadenopathy or easy bruising Neurological:Denies numbness, tingling or new weaknesses Behavioral/Psych: Mood is stable, no new changes   All other systems were reviewed with the patient and are negative.   VITALS:   Today's Vitals   12/23/23 0844 12/23/23 0854  BP: 138/80   Pulse: 64   Resp: 17   Temp: 97.8 F (36.6 C)   SpO2: 99%   Weight: 206 lb (93.4 kg)   PainSc:  0-No pain   Body mass index is 32.26 kg/m.   Wt Readings from Last 3 Encounters:  12/23/23 206 lb (93.4 kg)  11/18/23 207 lb (93.9 kg)  10/27/23 207 lb (93.9 kg)    Body mass index is 32.26 kg/m.  Performance status (ECOG): 1 - Symptomatic but completely ambulatory  PHYSICAL EXAM:   GENERAL:alert, no distress and comfortable SKIN: skin color, texture, turgor are normal.  2 to 3 cm, round lesion on the left medial forearm.  It is raised and rough in texture and light in color.  There is small, fine red rash on the right posterior forearm with no specific lesion present.  Skin is intact. EYES: normal, Conjunctiva are pink and non-injected, sclera clear OROPHARYNX:no exudate, no erythema and lips, buccal mucosa, and tongue normal  NECK: supple, thyroid  normal size, non-tender, without nodularity LYMPH:  no palpable lymphadenopathy in the cervical, axillary or inguinal LUNGS: clear to auscultation and percussion with normal breathing effort HEART: regular rate & rhythm and no murmurs and no lower extremity edema ABDOMEN:abdomen soft, non-tender and normal bowel sounds Musculoskeletal:no cyanosis of digits and no clubbing  NEURO: alert & oriented x 3 with fluent speech, no focal motor/sensory deficits  LABORATORY DATA:  I have reviewed the data as listed    Component Value Date/Time   NA 143 12/23/2023 0827   NA 144 11/30/2023 1329   K 3.7 12/23/2023 0827   CL 107 12/23/2023 0827   CO2 27 12/23/2023 0827   GLUCOSE 103 (H) 12/23/2023 0827   BUN 34 (H) 12/23/2023 0827   BUN 34 (H) 11/30/2023 1329   CREATININE 1.57 (H) 12/23/2023 0827   CREATININE 1.08 05/17/2022 0918   CALCIUM  9.1 12/23/2023 0827   PROT 6.3 (L) 12/23/2023 0827   PROT  6.2 06/30/2020 1516   ALBUMIN  4.1 12/23/2023 0827   ALBUMIN  3.7 06/30/2020 1516   AST 18 12/23/2023 0827   ALT 15 12/23/2023 0827   ALKPHOS 57 12/23/2023 0827   BILITOT 0.6 12/23/2023 0827   GFRNONAA 45 (L) 12/23/2023 0827   GFRAA 99 07/03/2020 1016   GFRAA >60 04/29/2020 0948    Lab Results  Component Value  Date   WBC 6.9 12/23/2023   NEUTROABS 4.1 12/23/2023   HGB 11.3 (L) 12/23/2023   HCT 35.1 (L) 12/23/2023   MCV 84.8 12/23/2023   PLT 211 12/23/2023

## 2023-12-23 ENCOUNTER — Encounter: Payer: Self-pay | Admitting: Nurse Practitioner

## 2023-12-23 ENCOUNTER — Inpatient Hospital Stay: Payer: Medicare HMO | Admitting: Nurse Practitioner

## 2023-12-23 ENCOUNTER — Inpatient Hospital Stay: Payer: Medicare HMO

## 2023-12-23 ENCOUNTER — Inpatient Hospital Stay: Payer: Medicare HMO | Attending: Nurse Practitioner

## 2023-12-23 VITALS — BP 138/80 | HR 64 | Temp 97.8°F | Resp 17 | Wt 206.0 lb

## 2023-12-23 VITALS — BP 131/52 | HR 70 | Temp 99.1°F | Resp 16

## 2023-12-23 DIAGNOSIS — L989 Disorder of the skin and subcutaneous tissue, unspecified: Secondary | ICD-10-CM

## 2023-12-23 DIAGNOSIS — C61 Malignant neoplasm of prostate: Secondary | ICD-10-CM

## 2023-12-23 DIAGNOSIS — Z79899 Other long term (current) drug therapy: Secondary | ICD-10-CM | POA: Insufficient documentation

## 2023-12-23 DIAGNOSIS — D649 Anemia, unspecified: Secondary | ICD-10-CM | POA: Diagnosis not present

## 2023-12-23 LAB — CBC WITH DIFFERENTIAL (CANCER CENTER ONLY)
Abs Immature Granulocytes: 0.02 10*3/uL (ref 0.00–0.07)
Basophils Absolute: 0.1 10*3/uL (ref 0.0–0.1)
Basophils Relative: 1 %
Eosinophils Absolute: 0.3 10*3/uL (ref 0.0–0.5)
Eosinophils Relative: 4 %
HCT: 35.1 % — ABNORMAL LOW (ref 39.0–52.0)
Hemoglobin: 11.3 g/dL — ABNORMAL LOW (ref 13.0–17.0)
Immature Granulocytes: 0 %
Lymphocytes Relative: 27 %
Lymphs Abs: 1.9 10*3/uL (ref 0.7–4.0)
MCH: 27.3 pg (ref 26.0–34.0)
MCHC: 32.2 g/dL (ref 30.0–36.0)
MCV: 84.8 fL (ref 80.0–100.0)
Monocytes Absolute: 0.6 10*3/uL (ref 0.1–1.0)
Monocytes Relative: 8 %
Neutro Abs: 4.1 10*3/uL (ref 1.7–7.7)
Neutrophils Relative %: 60 %
Platelet Count: 211 10*3/uL (ref 150–400)
RBC: 4.14 MIL/uL — ABNORMAL LOW (ref 4.22–5.81)
RDW: 15.7 % — ABNORMAL HIGH (ref 11.5–15.5)
WBC Count: 6.9 10*3/uL (ref 4.0–10.5)
nRBC: 0 % (ref 0.0–0.2)

## 2023-12-23 LAB — CMP (CANCER CENTER ONLY)
ALT: 15 U/L (ref 0–44)
AST: 18 U/L (ref 15–41)
Albumin: 4.1 g/dL (ref 3.5–5.0)
Alkaline Phosphatase: 57 U/L (ref 38–126)
Anion gap: 9 (ref 5–15)
BUN: 34 mg/dL — ABNORMAL HIGH (ref 8–23)
CO2: 27 mmol/L (ref 22–32)
Calcium: 9.1 mg/dL (ref 8.9–10.3)
Chloride: 107 mmol/L (ref 98–111)
Creatinine: 1.57 mg/dL — ABNORMAL HIGH (ref 0.61–1.24)
GFR, Estimated: 45 mL/min — ABNORMAL LOW (ref >60)
Glucose, Bld: 103 mg/dL — ABNORMAL HIGH (ref 70–99)
Potassium: 3.7 mmol/L (ref 3.5–5.1)
Sodium: 143 mmol/L (ref 135–145)
Total Bilirubin: 0.6 mg/dL (ref 0.0–1.2)
Total Protein: 6.3 g/dL — ABNORMAL LOW (ref 6.5–8.1)

## 2023-12-23 MED ORDER — LEUPROLIDE ACETATE (4 MONTH) 30 MG ~~LOC~~ KIT
30.0000 mg | PACK | Freq: Once | SUBCUTANEOUS | Status: AC
  Administered 2023-12-23: 30 mg via SUBCUTANEOUS
  Filled 2023-12-23: qty 30

## 2023-12-24 LAB — PROSTATE-SPECIFIC AG, SERUM (LABCORP): Prostate Specific Ag, Serum: <0.1 ng/mL (ref 0.0–4.0)

## 2023-12-28 DIAGNOSIS — E118 Type 2 diabetes mellitus with unspecified complications: Secondary | ICD-10-CM | POA: Diagnosis not present

## 2024-01-08 DIAGNOSIS — E118 Type 2 diabetes mellitus with unspecified complications: Secondary | ICD-10-CM | POA: Diagnosis not present

## 2024-01-17 ENCOUNTER — Other Ambulatory Visit (HOSPITAL_COMMUNITY): Payer: Self-pay

## 2024-01-17 ENCOUNTER — Other Ambulatory Visit: Payer: Self-pay

## 2024-01-17 ENCOUNTER — Other Ambulatory Visit: Payer: Self-pay | Admitting: Hematology

## 2024-01-17 ENCOUNTER — Other Ambulatory Visit: Payer: Self-pay | Admitting: Pharmacy Technician

## 2024-01-17 DIAGNOSIS — C61 Malignant neoplasm of prostate: Secondary | ICD-10-CM

## 2024-01-17 MED ORDER — ABIRATERONE ACETATE 250 MG PO TABS
1000.0000 mg | ORAL_TABLET | Freq: Every day | ORAL | 2 refills | Status: DC
  Filled 2024-01-17: qty 120, 30d supply, fill #0
  Filled 2024-02-23: qty 120, 30d supply, fill #1
  Filled 2024-03-19 – 2024-03-23 (×2): qty 120, 30d supply, fill #2

## 2024-01-17 NOTE — Progress Notes (Signed)
 Specialty Pharmacy Refill Coordination Note  Matthew Serrano. is a 78 y.o. male contacted today regarding refills of specialty medication(s) Abiraterone  Acetate (ZYTIGA )   Patient requested Delivery   Delivery date: 01/30/24 (No delivery on weekends)   Verified address: [address] [name] [address]   Medication will be filled on 01/27/24.  This fill date is pending response to refill request from provider. Left Patient voicemail and if they have not received fill by intended date they must follow up with pharmacy.

## 2024-01-18 ENCOUNTER — Other Ambulatory Visit: Payer: Self-pay | Admitting: Adult Health

## 2024-01-18 DIAGNOSIS — E039 Hypothyroidism, unspecified: Secondary | ICD-10-CM

## 2024-01-19 ENCOUNTER — Other Ambulatory Visit (HOSPITAL_COMMUNITY): Payer: Self-pay

## 2024-01-27 ENCOUNTER — Other Ambulatory Visit: Payer: Self-pay

## 2024-02-07 DIAGNOSIS — E118 Type 2 diabetes mellitus with unspecified complications: Secondary | ICD-10-CM | POA: Diagnosis not present

## 2024-02-20 DIAGNOSIS — E118 Type 2 diabetes mellitus with unspecified complications: Secondary | ICD-10-CM | POA: Diagnosis not present

## 2024-02-23 ENCOUNTER — Other Ambulatory Visit: Payer: Self-pay

## 2024-02-27 ENCOUNTER — Other Ambulatory Visit: Payer: Self-pay

## 2024-02-27 NOTE — Progress Notes (Signed)
 Specialty Pharmacy Refill Coordination Note  Matthew Redwine. is a 78 y.o. male contacted today regarding refills of specialty medication(s) Abiraterone  Acetate (ZYTIGA )   Patient requested Delivery   Delivery date: 02/28/24   Verified address: [address] [place]  [phone]   Medication will be filled on 02/27/24.

## 2024-02-27 NOTE — Progress Notes (Signed)
 Specialty Pharmacy Ongoing Clinical Assessment Note  Matthew Serrano. is a 78 y.o. male who is being followed by the specialty pharmacy service for RxSp Oncology   Patient's specialty medication(s) reviewed today: Abiraterone  Acetate (ZYTIGA )   Missed doses in the last 4 weeks: 0   Patient/Caregiver did not have any additional questions or concerns.   Therapeutic benefit summary: Patient is achieving benefit   Adverse events/side effects summary: No adverse events/side effects   Patient's therapy is appropriate to: Continue    Goals Addressed             This Visit's Progress    Slow Disease Progression   On track    Patient is on track. Patient will maintain adherence. PSA undetectable.          Follow up: 6 months  Silvano LOISE Dolly Specialty Pharmacist

## 2024-03-06 ENCOUNTER — Encounter: Admitting: Adult Health

## 2024-03-08 DIAGNOSIS — E118 Type 2 diabetes mellitus with unspecified complications: Secondary | ICD-10-CM | POA: Diagnosis not present

## 2024-03-14 ENCOUNTER — Other Ambulatory Visit: Payer: Self-pay

## 2024-03-14 DIAGNOSIS — Z952 Presence of prosthetic heart valve: Secondary | ICD-10-CM

## 2024-03-19 ENCOUNTER — Other Ambulatory Visit (HOSPITAL_COMMUNITY): Payer: Self-pay

## 2024-03-21 ENCOUNTER — Other Ambulatory Visit: Payer: Self-pay

## 2024-03-22 DIAGNOSIS — E118 Type 2 diabetes mellitus with unspecified complications: Secondary | ICD-10-CM | POA: Diagnosis not present

## 2024-03-23 ENCOUNTER — Other Ambulatory Visit: Payer: Self-pay

## 2024-03-23 NOTE — Progress Notes (Signed)
 Specialty Pharmacy Refill Coordination Note  Matthew Serrano. is a 78 y.o. male contacted today regarding refills of specialty medication(s) Abiraterone  Acetate (ZYTIGA )   Patient requested Delivery   Delivery date: 03/27/24   Verified address: [address] [place] [address]   Medication will be filled on 03/26/24.

## 2024-03-26 ENCOUNTER — Ambulatory Visit (HOSPITAL_COMMUNITY)
Admission: RE | Admit: 2024-03-26 | Discharge: 2024-03-26 | Disposition: A | Discharge status: Home / Self Care | Source: Ambulatory Visit | Attending: Cardiology | Admitting: Cardiology

## 2024-03-26 DIAGNOSIS — K449 Diaphragmatic hernia without obstruction or gangrene: Secondary | ICD-10-CM | POA: Insufficient documentation

## 2024-03-26 DIAGNOSIS — Z8679 Personal history of other diseases of the circulatory system: Secondary | ICD-10-CM | POA: Diagnosis not present

## 2024-03-26 DIAGNOSIS — I330 Acute and subacute infective endocarditis: Secondary | ICD-10-CM | POA: Insufficient documentation

## 2024-03-26 DIAGNOSIS — I2510 Atherosclerotic heart disease of native coronary artery without angina pectoris: Secondary | ICD-10-CM | POA: Diagnosis not present

## 2024-03-26 LAB — POCT I-STAT CREATININE: Creatinine, Ser: 1.2 mg/dL (ref 0.61–1.24)

## 2024-03-26 MED ORDER — IOHEXOL 350 MG/ML SOLN
75.0000 mL | Freq: Once | INTRAVENOUS | Status: AC | PRN
  Administered 2024-03-26: 75 mL via INTRAVENOUS

## 2024-04-01 ENCOUNTER — Other Ambulatory Visit: Payer: Self-pay | Admitting: Adult Health

## 2024-04-01 DIAGNOSIS — E039 Hypothyroidism, unspecified: Secondary | ICD-10-CM

## 2024-04-07 DIAGNOSIS — E118 Type 2 diabetes mellitus with unspecified complications: Secondary | ICD-10-CM | POA: Diagnosis not present

## 2024-04-11 ENCOUNTER — Ambulatory Visit: Admitting: Adult Health

## 2024-04-11 ENCOUNTER — Ambulatory Visit: Payer: Self-pay | Admitting: Adult Health

## 2024-04-11 ENCOUNTER — Encounter: Payer: Self-pay | Admitting: Adult Health

## 2024-04-11 VITALS — BP 140/70 | HR 65 | Temp 98.5°F | Ht 67.0 in | Wt 204.0 lb

## 2024-04-11 DIAGNOSIS — I110 Hypertensive heart disease with heart failure: Secondary | ICD-10-CM | POA: Diagnosis not present

## 2024-04-11 DIAGNOSIS — E782 Mixed hyperlipidemia: Secondary | ICD-10-CM

## 2024-04-11 DIAGNOSIS — E119 Type 2 diabetes mellitus without complications: Secondary | ICD-10-CM | POA: Diagnosis not present

## 2024-04-11 DIAGNOSIS — Z7984 Long term (current) use of oral hypoglycemic drugs: Secondary | ICD-10-CM | POA: Diagnosis not present

## 2024-04-11 DIAGNOSIS — C61 Malignant neoplasm of prostate: Secondary | ICD-10-CM

## 2024-04-11 DIAGNOSIS — I10 Essential (primary) hypertension: Secondary | ICD-10-CM

## 2024-04-11 DIAGNOSIS — E039 Hypothyroidism, unspecified: Secondary | ICD-10-CM

## 2024-04-11 DIAGNOSIS — Z0000 Encounter for general adult medical examination without abnormal findings: Secondary | ICD-10-CM

## 2024-04-11 DIAGNOSIS — I5032 Chronic diastolic (congestive) heart failure: Secondary | ICD-10-CM | POA: Diagnosis not present

## 2024-04-11 LAB — MICROALBUMIN / CREATININE URINE RATIO
Creatinine,U: 164 mg/dL
Microalb Creat Ratio: 5.9 mg/g (ref 0.0–30.0)
Microalb, Ur: 1 mg/dL (ref 0.0–1.9)

## 2024-04-11 LAB — COMPREHENSIVE METABOLIC PANEL WITH GFR
ALT: 14 U/L (ref 0–53)
AST: 20 U/L (ref 0–37)
Albumin: 4.1 g/dL (ref 3.5–5.2)
Alkaline Phosphatase: 50 U/L (ref 39–117)
BUN: 19 mg/dL (ref 6–23)
CO2: 33 meq/L — ABNORMAL HIGH (ref 19–32)
Calcium: 9.4 mg/dL (ref 8.4–10.5)
Chloride: 103 meq/L (ref 96–112)
Creatinine, Ser: 1.37 mg/dL (ref 0.40–1.50)
GFR: 50.06 mL/min — ABNORMAL LOW (ref >60.00)
Glucose, Bld: 156 mg/dL — ABNORMAL HIGH (ref 70–99)
Potassium: 4.3 meq/L (ref 3.5–5.1)
Sodium: 145 meq/L (ref 135–145)
Total Bilirubin: 1 mg/dL (ref 0.2–1.2)
Total Protein: 6 g/dL (ref 6.0–8.3)

## 2024-04-11 LAB — CBC
HCT: 32.3 % — ABNORMAL LOW (ref 39.0–52.0)
Hemoglobin: 10.5 g/dL — ABNORMAL LOW (ref 13.0–17.0)
MCHC: 32.5 g/dL (ref 30.0–36.0)
MCV: 86.2 fl (ref 78.0–100.0)
Platelets: 172 K/uL (ref 150.0–400.0)
RBC: 3.75 Mil/uL — ABNORMAL LOW (ref 4.22–5.81)
RDW: 15.9 % — ABNORMAL HIGH (ref 11.5–15.5)
WBC: 6.9 K/uL (ref 4.0–10.5)

## 2024-04-11 LAB — PSA: PSA: 0 ng/mL — ABNORMAL LOW (ref 0.10–4.00)

## 2024-04-11 LAB — LIPID PANEL
Cholesterol: 144 mg/dL (ref 0–200)
HDL: 56.8 mg/dL (ref >39.00)
LDL Cholesterol: 67 mg/dL (ref 0–99)
NonHDL: 87.63
Total CHOL/HDL Ratio: 3
Triglycerides: 103 mg/dL (ref 0.0–149.0)
VLDL: 20.6 mg/dL (ref 0.0–40.0)

## 2024-04-11 LAB — TSH: TSH: 11.24 u[IU]/mL — ABNORMAL HIGH (ref 0.35–5.50)

## 2024-04-11 LAB — HEMOGLOBIN A1C: Hgb A1c MFr Bld: 6.3 % (ref 4.6–6.5)

## 2024-04-11 NOTE — Patient Instructions (Addendum)
 It was great seeing you today   We will follow up with you regarding your lab work   Please let me know if you need anything

## 2024-04-11 NOTE — Progress Notes (Signed)
 Subjective:    Patient ID: Matthew Serrano., male    DOB: [birth year], 78 y.o.   MRN: [id number]  HPI  Patient presents for yearly preventative medicine examination. 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  DM Type II -maintained on glipizide  10 mg ER twice daily and metformin  1000 mg twice daily.  He does check his blood sugars twice a day with fasting readings in the 80s to 110s and postprandial in the 140s to 180s. He has not been walking much but plans to get back into it.  Lab Results  Component Value Date   HGBA1C 6.4 04/08/2023   HGBA1C 6.2 (A) 01/06/2023   HGBA1C 6.1 (H) 02/26/2022   Hypertension-prescribed  Labetalol   600 mg in the morning and 400 mg in the evening as well as torsemide  20 mg twice daily, lisinopril  5 mg daily and Spirolactone 25 mg daily.  He denies dizziness, lightheadedness, chest pain, or shortness of breath.  He does check his blood pressures at home and reports morning readings in the 150's and then it goes down into the 120's systolic throughout the day.  BP Readings from Last 3 Encounters:  04/11/24 (!) 140/70  12/23/23 (!) 131/52  12/23/23 138/80   Hypothyroidism -managed with Synthroid  125 mcg daily. Lab Results  Component Value Date   TSH 0.97 04/08/2023   Hyperlipidemia/CAD - managed with lipitor  80 mg daily.  Lab Results  Component Value Date   CHOL 169 04/08/2023   HDL 56.70 04/08/2023   LDLCALC 88  04/08/2023   LDLDIRECT 105.0 04/28/2016   TRIG 119.0 04/08/2023   CHOLHDL 3 04/08/2023    Heart Failure with preserved ejection fraction -cardiology with torsemide  20 mg daily and spironolactone  25 mg daily.  He denies dizziness, lightheadedness, chest pain, shortness of breath,. He does have some mild lower extremity edema L>R  Metastatic prostate Cancer-diagnosed in 2019 currently stage iV with node metastasis.  He is undergoing treatment with Zytiga   PSA has been undetectable since treatment started   All immunizations and health maintenance protocols were reviewed with the patient and needed orders were placed.  Appropriate screening laboratory values were ordered for the patient including screening of hyperlipidemia, renal function and hepatic function. If indicated by BPH, a PSA was ordered.  Medication reconciliation,  past medical history, social history, problem list and allergies were reviewed in detail with the patient  Goals were established with regard to weight loss, exercise, and  diet in compliance with medications Wt Readings from Last 3 Encounters:  04/11/24 204 lb (92.5 kg)  12/23/23 206 lb (93.4 kg)  11/18/23 207 lb (93.9 kg)    Review of Systems  Constitutional: Negative.   HENT: Negative.    Eyes: Negative.   Respiratory: Negative.    Cardiovascular: Negative.   Gastrointestinal: Negative.   Endocrine: Negative.   Genitourinary: Negative.   Musculoskeletal: Negative.   Skin: Negative.   Allergic/Immunologic: Negative.   Neurological: Negative.   Hematological: Negative.   Psychiatric/Behavioral: Negative.    All other systems reviewed and are negative.  Past Medical History:  Diagnosis Date  Aortic valve endocarditis 05/13/2020   STREPTOCOCCUS ANGINOSIS   AVM (arteriovenous malformation) of colon    Cancer [facility])    prostate cancer   DIABETES MELLITUS, TYPE II 07/06/2007   ED (erectile dysfunction)    Elevated PSA 01/20/2012   Fistula between  aorta and pulmonary artery (HCC) 05/14/2020   History of nuclear stress test    Myoview  8/18: EF 64, inferobasal thinning, no ischemia, low risk   HYPERLIPIDEMIA 07/06/2007   HYPERTENSION 07/06/2007   HYPOTHYROIDISM 07/06/2007   Leg pain    ABIs 8/18:  Normal    Obesity    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   23 mm human aortic root graft with 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 and reimplantation of left main and right coronary arteries   Severe aortic insufficiency 05/13/2020   acute onset in setting of bacterial endocarditis   Severe aortic stenosis     Social History   Socioeconomic History   Marital status: Widowed    Spouse name: Not on file   Number of children: 0   Years of education: Not on file   Highest education level: Not on file  Occupational History   Not on file  Tobacco Use   Smoking status: Former   Smokeless tobacco: Never   Tobacco comments:    quit 2005  Vaping Use   Vaping status: Never Used  Substance and Sexual Activity   Alcohol use: Not Currently    Alcohol/week: 14.0 standard drinks of alcohol    Types: 14 Cans of beer per week    Comment: stopped in 07/2022   Drug use: No   Sexual activity: Not on file  Other Topics Concern   Not on file  Social History Narrative   He works as a Estate agent    Not married    No kids          Social Drivers of Corporate investment banker Strain: Low Risk  (11/18/2023)   Overall Financial Resource Strain (CARDIA)    Difficulty of Paying Living Expenses: Not hard at all  Food Insecurity: No Food Insecurity (11/18/2023)   Hunger Vital Sign    Worried About Running Out of Food in the Last Year: Never true    Ran Out of Food in the Last Year: Never true  Transportation Needs: No Transportation Needs (11/18/2023)   PRAPARE - Administrator, Civil Service (Medical): No    Lack of Transportation (Non-Medical): No  Physical Activity: Insufficiently Active  (11/18/2023)   Exercise Vital Sign    Days of Exercise per Week: 2 days    Minutes of Exercise per Session: 20 min  Stress: No Stress Concern Present (11/18/2023)   [employer] of Occupational Health - Occupational Stress Questionnaire    Feeling of Stress : Not at all  Social Connections: Socially Isolated (11/18/2023)   Social Connection and Isolation Panel    Frequency of Communication with Friends and Family: More than three times a week    Frequency of Social Gatherings with Friends and Family: More than three times a week    Attends Religious Services: Never    Database administrator or Organizations: No    Attends Banker Meetings: Never    Marital Status: Widowed  Intimate Partner Violence: Not At Risk (11/18/2023)   Humiliation, Afraid, Rape, and Kick questionnaire    Fear of Current or Ex-Partner: No    Emotionally Abused: No  Physically Abused: No    Sexually Abused: No    Past Surgical History:  Procedure Laterality Date   AORTIC VALVE REPLACEMENT N/A 05/15/2020   Procedure: AORTIC VALVE REPLACEMENT (AVR) SIZE 23 MM,  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;  Surgeon: Dusty Sudie DEL, MD;  Location: [facility] OR;  Service: Open Heart Surgery;  Laterality: N/A;   ASCENDING AORTIC ROOT REPLACEMENT N/A 05/15/2020   Procedure: HUMAN ALLOGRAFT AORTIC ROOT REPLACEMENT;  Surgeon: Dusty Sudie DEL, MD;  Location: [facility] OR;  Service: Open Heart Surgery;  Laterality: N/A;   BUBBLE STUDY  03/03/2022   Procedure: BUBBLE STUDY;  Surgeon: Hobart Powell BRAVO, MD;  Location: [facility] ENDOSCOPY;  Service: Cardiovascular;;   COLONOSCOPY     COLONOSCOPY N/A 05/03/2020   Procedure: COLONOSCOPY;  Surgeon: Kristie Lamprey, MD;  Location: [facility] ENDOSCOPY;  Service: Endoscopy;  Laterality: N/A;   COLONOSCOPY W/ BIOPSIES AND POLYPECTOMY     ENTEROSCOPY N/A 05/02/2020   Procedure: ENTEROSCOPY;  Surgeon: Wilhelmenia Aloha Raddle., MD;  Location: [facility] ENDOSCOPY;  Service: Gastroenterology;   Laterality: N/A;   HEMOSTASIS CLIP PLACEMENT  05/03/2020   Procedure: HEMOSTASIS CLIP PLACEMENT;  Surgeon: Kristie Lamprey, MD;  Location: [facility] ENDOSCOPY;  Service: Endoscopy;;   HOT HEMOSTASIS N/A 05/03/2020   Procedure: HOT HEMOSTASIS (ARGON PLASMA COAGULATION/BICAP);  Surgeon: Kristie Lamprey, MD;  Location: [facility];  Service: Endoscopy;  Laterality: N/A;   IR IMAGING GUIDED PORT INSERTION  07/05/2018   POLYPECTOMY     PORT-A-CATH REMOVAL Right 05/20/2020   Procedure: REMOVAL PORT-A-CATH;  Surgeon: Dusty Sudie DEL, MD;  Location[facility] OR;  Service: Thoracic;  Laterality: Right;   RIGHT/LEFT HEART CATH AND CORONARY ANGIOGRAPHY N/A 02/13/2020   Procedure: RIGHT/LEFT HEART CATH AND CORONARY ANGIOGRAPHY;  Surgeon: Claudene Victory ORN, MD;  Location: [facility] INVASIVE CV LAB;  Service: Cardiovascular;  Laterality: N/A;   SUBMUCOSAL INJECTION  05/02/2020   Procedure: SUBMUCOSAL INJECTION;  Surgeon: Wilhelmenia Aloha Raddle., MD;  Location: [facility] ENDOSCOPY;  Service: Gastroenterology;;   TEE WITHOUT CARDIOVERSION N/A 05/14/2020   Procedure: TRANSESOPHAGEAL ECHOCARDIOGRAM (TEE);  Surgeon: Francyne Headland, MD;  Location: [facility] ENDOSCOPY;  Service: Cardiovascular;  Laterality: N/A;   TEE WITHOUT CARDIOVERSION N/A 05/15/2020   Procedure: TRANSESOPHAGEAL ECHOCARDIOGRAM (TEE);  Surgeon: Dusty Sudie DEL, MD;  Location: [facility] OR;  Service: Open Heart Surgery;  Laterality: N/A;   TEE WITHOUT CARDIOVERSION N/A 03/03/2022   Procedure: TRANSESOPHAGEAL ECHOCARDIOGRAM (TEE);  Surgeon: Hobart Powell BRAVO, MD;  Location: [facility] ENDOSCOPY;  Service: Cardiovascular;  Laterality: N/A;    Family History  Problem Relation Age of Onset   Aneurysm Mother        Brain aneurysm    Breast cancer Sister    Prostate cancer Brother    Throat cancer Brother    Heart failure Brother    Hypertension Other    Breast cancer Other        family history   Colon cancer Neg Hx    Esophageal cancer Neg Hx    Stomach cancer Neg Hx    Rectal cancer Neg Hx      No Known Allergies  Current Outpatient Medications on File Prior to Visit  Medication Sig Dispense Refill   abiraterone  acetate (ZYTIGA ) 250 MG tablet Take 4 tablets (1,000 mg total) by mouth daily. Take on an empty stomach 1 hour before or 2 hours after a meal 120 tablet 2   Alcohol Swabs (DROPSAFE ALCOHOL PREP) 70 % PADS AS DIRECTED 300 each 3   aspirin  EC 81  MG tablet Take 81 mg by mouth daily. Swallow whole.     atorvastatin  (LIPITOR ) 80 MG tablet TAKE 1 TABLET EVERY DAY 90 tablet 3   Blood Glucose Calibration (TRUE METRIX LEVEL 1) Low SOLN USE AS DIRECTED WITH GLUCOSE METER 1 each 3   calcium  carbonate (CALCIUM  600) 1500 (600 Ca) MG TABS tablet Take 0.8 tablets (1,200 mg total) by mouth daily. 30 tablet 0   cholecalciferol (VITAMIN D3) 25 MCG (1000 UT) tablet Take 1,000 Units by mouth daily.     ferrous sulfate  325 (65 FE) MG EC tablet Take 1 tablet (325 mg total) by mouth 2 (two) times daily before a meal. 60 tablet 2   glipiZIDE  (GLUCOTROL  XL) 10 MG 24 hr tablet TAKE 1 TABLET TWICE DAILY 180 tablet 0   labetalol  (NORMODYNE ) 200 MG tablet TAKE 3 TABLETS EVERY MORNING AND TAKE 2 TABLETS EVERY EVENING 450 tablet 3   levothyroxine  (SYNTHROID ) 125 MCG tablet TAKE 1 TABLET EVERY DAY 90 tablet 0   lisinopril  (ZESTRIL ) 2.5 MG tablet Take 2 tablets (5 mg total) by mouth daily.     metFORMIN  (GLUCOPHAGE ) 1000 MG tablet TAKE 1 TABLET TWICE DAILY WITH MEALS 180 tablet 3   omeprazole  (PRILOSEC) 40 MG capsule TAKE 1 CAPSULE EVERY DAY 90 capsule 2   potassium chloride  SA (KLOR-CON  M) 20 MEQ tablet Take 1 tablet (20 mEq total) by mouth daily. 90 tablet 3   predniSONE  (DELTASONE ) 5 MG tablet Take 1 tablet (5 mg total) by mouth daily with breakfast. 90 tablet 3   spironolactone  (ALDACTONE ) 25 MG tablet TAKE 1 TABLET EVERY OTHER DAY 45 tablet 2   torsemide  (DEMADEX ) 20 MG tablet TAKE 1 TABLET EVERY DAY 90 tablet 2   No current facility-administered medications on file prior to visit.    BP (!)  140/70   Pulse 65   Temp 98.5 F (36.9 C) (Oral)   Ht 5' 7 (1.702 m)   Wt 204 lb (92.5 kg)   SpO2 99%   BMI 31.95 kg/m       Objective:   Physical Exam Vitals and nursing note reviewed.  Constitutional:      General: He is not in acute distress.    Appearance: Normal appearance. He is not ill-appearing.  HENT:     Head: Normocephalic and atraumatic.     Right Ear: Tympanic membrane, ear canal and external ear normal. There is no impacted cerumen.     Left Ear: Tympanic membrane, ear canal and external ear normal. There is no impacted cerumen.     Nose: Nose normal. No congestion or rhinorrhea.     Mouth/Throat:     Mouth: Mucous membranes are moist.     Pharynx: Oropharynx is clear.  Eyes:     Extraocular Movements: Extraocular movements intact.     Conjunctiva/sclera: Conjunctivae normal.     Pupils: Pupils are equal, round, and reactive to light.  Neck:     Vascular: No carotid bruit.  Cardiovascular:     Rate and Rhythm: Normal rate and regular rhythm.     Pulses: Normal pulses.     Heart sounds: Murmur heard.     No friction rub. No gallop.  Pulmonary:     Effort: Pulmonary effort is normal.     Breath sounds: Normal breath sounds.  Abdominal:     General: Abdomen is flat. Bowel sounds are normal. There is no distension.     Palpations: Abdomen is soft. There is no mass.  Tenderness: There is no abdominal tenderness. There is no guarding or rebound.     Hernia: No hernia is present.  Musculoskeletal:        General: Normal range of motion.     Cervical back: Normal range of motion and neck supple.     Right lower leg: 1+ Pitting Edema present.     Left lower leg: 2+ Pitting Edema present.  Lymphadenopathy:     Cervical: No cervical adenopathy.  Skin:    General: Skin is warm and dry.     Capillary Refill: Capillary refill takes less than 2 seconds.  Neurological:     General: No focal deficit present.     Mental Status: He is alert and oriented to  person, place, and time.  Psychiatric:        Mood and Affect: Mood normal.        Behavior: Behavior normal.        Thought Content: Thought content normal.        Judgment: Judgment normal.        Assessment & Plan:  1. Routine general medical examination at a health care facility (Primary) Today patient counseled on age appropriate routine health concerns for screening and prevention, each reviewed and up to date or declined. Immunizations reviewed and up to date or declined. Labs ordered and reviewed. Risk factors for depression reviewed and negative. Hearing function and visual acuity are intact. ADLs screened and addressed as needed. Functional ability and level of safety reviewed and appropriate. Education, counseling and referrals performed based on assessed risks today. Patient provided with a copy of personalized plan for preventive services. - Eat healthy and stay active  - Follow up in 1 year for CPE or sooner if needed  2. Diabetes mellitus treated with oral medication (HCC) - Consider adding agent  - 3 or 6 months follow up depending on A1c  - Lipid panel; Future - TSH; Future - CBC; Future - Comprehensive metabolic panel with GFR; Future - Hemoglobin A1c; Future - Microalbumin/Creatinine Ratio, Urine; Future  3. Essential hypertension - Slightly elevated in the office today. Consider increasing Lisinopril  to 10 mg - Lipid panel; Future - TSH; Future - CBC; Future - Comprehensive metabolic panel with GFR; Future  4. Hypothyroidism, unspecified type - Consider dose change of synthroid   - Lipid panel; Future - TSH; Future - CBC; Future - Comprehensive metabolic panel with GFR; Future  5. Mixed hyperlipidemia - Continue with statin  - Lipid panel; Future - TSH; Future - CBC; Future - Comprehensive metabolic panel with GFR; Future  6. Chronic heart failure with preserved ejection fraction (HCC) - Does have edema in his lower extremities today L>R. Continue  with spirolactone and torsemide .  - Lipid panel; Future - TSH; Future - CBC; Future - Comprehensive metabolic panel with GFR; Future  7. Prostate cancer metastatic to multiple sites [facility])  - PSA; Future  Darleene Shape, NP

## 2024-04-12 ENCOUNTER — Telehealth: Payer: Self-pay | Admitting: *Deleted

## 2024-04-12 NOTE — Telephone Encounter (Signed)
 Copied from CRM #[id number]. Topic: Clinical - Lab/Test Results >> Apr 12, 2024  3:49 PM [place] E wrote: Reason for CRM: Patient returning call from office in regards to lab results, relayed message from PCP and patient stated he has been taking his Synthroid  every morning.

## 2024-04-14 ENCOUNTER — Other Ambulatory Visit: Payer: Self-pay | Admitting: Adult Health

## 2024-04-14 DIAGNOSIS — E119 Type 2 diabetes mellitus without complications: Secondary | ICD-10-CM

## 2024-04-16 ENCOUNTER — Ambulatory Visit: Payer: Self-pay | Admitting: Cardiovascular Disease

## 2024-04-17 ENCOUNTER — Ambulatory Visit (HOSPITAL_COMMUNITY)
Admission: RE | Admit: 2024-04-17 | Discharge: 2024-04-17 | Disposition: A | Discharge status: Home / Self Care | Source: Ambulatory Visit | Attending: Cardiovascular Disease | Admitting: Cardiovascular Disease

## 2024-04-17 DIAGNOSIS — Z952 Presence of prosthetic heart valve: Secondary | ICD-10-CM | POA: Diagnosis not present

## 2024-04-17 LAB — ECHOCARDIOGRAM COMPLETE
AR max vel: 1.86 cm2
AV Area VTI: 1.94 cm2
AV Area mean vel: 1.83 cm2
AV Mean grad: 5 mmHg
AV Peak grad: 10.2 mmHg
Ao pk vel: 1.6 m/s
Area-P 1/2: 4.07 cm2
P 1/2 time: 511 ms
S' Lateral: 3.2 cm

## 2024-04-18 ENCOUNTER — Other Ambulatory Visit (HOSPITAL_COMMUNITY): Payer: Self-pay

## 2024-04-18 DIAGNOSIS — E119 Type 2 diabetes mellitus without complications: Secondary | ICD-10-CM | POA: Diagnosis not present

## 2024-04-19 LAB — LAB REPORT - SCANNED
Albumin, Urine POC: 0.5
Creatinine, POC: 99 mg/dL
EGFR: 57
Microalb Creat Ratio: 5

## 2024-04-20 ENCOUNTER — Telehealth: Payer: Self-pay

## 2024-04-20 ENCOUNTER — Other Ambulatory Visit: Payer: Self-pay | Admitting: Hematology

## 2024-04-20 ENCOUNTER — Other Ambulatory Visit: Payer: Self-pay

## 2024-04-20 ENCOUNTER — Other Ambulatory Visit (HOSPITAL_COMMUNITY): Payer: Self-pay

## 2024-04-20 DIAGNOSIS — C61 Malignant neoplasm of prostate: Secondary | ICD-10-CM

## 2024-04-20 MED ORDER — ABIRATERONE ACETATE 250 MG PO TABS
1000.0000 mg | ORAL_TABLET | Freq: Every day | ORAL | 2 refills | Status: DC
  Filled 2024-04-20: qty 120, 30d supply, fill #0
  Filled 2024-05-18: qty 120, 30d supply, fill #1
  Filled 2024-06-15 (×2): qty 120, 30d supply, fill #2

## 2024-04-20 NOTE — Progress Notes (Signed)
 Specialty Pharmacy Refill Coordination Note  Matthew Serrano. is a 78 y.o. male contacted today regarding refills of specialty medication(s) Abiraterone  Acetate (ZYTIGA )   Patient requested Delivery   Delivery date: 04/26/24   Verified address: [address] [place] [address]   Medication will be filled on 09.03.25.   This fill date is pending response to refill request from provider. Patient is aware and if they have not received fill by intended date they must follow up with pharmacy.

## 2024-04-20 NOTE — Telephone Encounter (Signed)
This has been taking care of.

## 2024-04-20 NOTE — Telephone Encounter (Signed)
 Copied from CRM [id number]. Topic: General - Call Back - No Documentation >> Apr 19, 2024  5:30 PM Harlene ORN wrote: Reason for CRM: Patient is returning call from the office.

## 2024-04-22 ENCOUNTER — Other Ambulatory Visit: Payer: Self-pay | Admitting: Adult Health

## 2024-04-22 DIAGNOSIS — E118 Type 2 diabetes mellitus with unspecified complications: Secondary | ICD-10-CM | POA: Diagnosis not present

## 2024-04-22 DIAGNOSIS — E119 Type 2 diabetes mellitus without complications: Secondary | ICD-10-CM

## 2024-04-24 ENCOUNTER — Ambulatory Visit: Payer: Self-pay | Admitting: Cardiovascular Disease

## 2024-04-24 ENCOUNTER — Other Ambulatory Visit: Payer: Self-pay | Admitting: Adult Health

## 2024-04-24 DIAGNOSIS — E039 Hypothyroidism, unspecified: Secondary | ICD-10-CM

## 2024-04-26 ENCOUNTER — Other Ambulatory Visit: Payer: Self-pay | Admitting: Nurse Practitioner

## 2024-04-26 DIAGNOSIS — C61 Malignant neoplasm of prostate: Secondary | ICD-10-CM

## 2024-04-26 NOTE — Assessment & Plan Note (Signed)
-  stage IV with node metastasis, diagnosed in 2019, castration-resistant now  -Initial prostate biopsy obtained on 06/06/2018 which showed high-volume disease with a Gleason score 4+5 = 9. -first line therapy with Taxotere  chemotherapy at 75 mg/m every 3 weeks started on 07/07/2018 and stopped in Feb 2020 after 6 cycles.  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 -currently on Eligard  30 mg every 4 months and Zytiga  1000 mg daily started in April 2020.  His PSA has been undetectable since then  -He is clinically doing very well, asymptomatic, will continue current treatment. -Will obtain a PSMA PET scan if his PSA increases significantly.  Most recent PSA done 04/18/2023 was<0.1. 08/25/2023 -PSA <0.02.  Patient more anemic than normal. Having blood present in stool. Sent message out to GI provider to schedule follow up and possible colonoscopy with him.  - 12/23/2023-most recent PSA <0.02.  Proceed with Eligard  injection of 30 mg and continue every 4 months.  Continue Zytiga  1000 mg daily.  Labs with follow-up in 4 months, sooner if needed.

## 2024-04-26 NOTE — Progress Notes (Unsigned)
 Patient Care Team: Merna Huxley, NP as PCP - General (Family Medicine) Wonda Sharper, MD as PCP - Cardiology (Cardiology) Cam Morene ORN, MD as Attending Physician (Urology) Lelon Glendia ONEIDA DEVONNA as Physician Assistant (Cardiology) Lanny Callander, MD as Attending Physician (Hematology and Oncology) Lionell Jon DEL, [facility] (Pharmacist)  Clinic Day:  04/27/2024  Referring physician: Merna Huxley, NP  ASSESSMENT & PLAN:   Assessment & Plan: Malignant neoplasm of prostate [facility]) -stage IV with node metastasis, diagnosed in 2019, castration-resistant now  -Initial prostate biopsy obtained on 06/06/2018 which showed high-volume disease with a Gleason score 4+5 = 9. -first line therapy with Taxotere  chemotherapy at 75 mg/m every 3 weeks started on 07/07/2018 and stopped in Feb 2020 after 6 cycles.  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 -currently on Eligard  30 mg every 4 months and Zytiga  1000 mg daily started in April 2020.  His PSA has been undetectable since then  -He is clinically doing very well, asymptomatic, will continue current treatment. -Will obtain a PSMA PET scan if his PSA increases significantly.  Most recent PSA done 04/18/2023 was<0.1. 08/25/2023 -PSA <0.02.  Patient more anemic than normal. Having blood present in stool. Sent message out to GI provider to schedule follow up and possible colonoscopy with him.  - 12/23/2023-most recent PSA <0.02.  Proceed with Eligard  injection of 30 mg and continue every 4 months.  Continue Zytiga  1000 mg daily.  Labs with follow-up in 4 months, sooner if needed. - 04/27/2024 -PSA from 04/19/2024 was 0.03.  He has stable and unremarkable CBC and CMP.  Proceed with Eligard  30 mg injection today.  Continue Zytiga  1000 mg and prednisone  5 mg daily. -Labs with follow-up and Eligard  injection in 4 months.   Dependent edema The patient mentions increased swelling of both feet.  Happens more often  when he is sitting for long periods of time.  Does not prop his feet up.  May not be drinking enough water.  He is on fluid pill and takes it as prescribed.  We discussed possible side effects of prednisone , Eligard , and Zytiga , which can cause some edema. Encouraged him to raise his feet when sitting.  Advised him to get up, walk around, stretch his legs, at least every 1-2 hours.  Recommend increase water intake and avoidance of excess salt.    Hypothyroid The patient did see his primary care on 04/11/2024.  He discussed recent swelling in his feet and lower legs.  Labs were done and indicated increased hypothyroid.  PCP suspects that thyroid  may be contributing to recent symptoms.  Patient had been taking thyroid  medication along with all other medications.  Patient advised to take his thyroid  medicine separate from all other medications.  Thyroid  will be rechecked later this month.  If unchanged, it is likely levothyroxine  dose will be adjusted.  Anemia Mild and stable anemia with Hgb 10.0 and HCT 34.8.  Likely from Zytiga .  Will continue to monitor closely and treat iron deficiency as indicated.  Abnormal kidney function Recent labs with PCP and today indicate mildly abnormal kidney functions with reduced GFR at 55.  This may be contributing to dependent edema.  Patient understands need to increase fluid intake.  This is monitored closely with primary care.  Metastatic prostate cancer Recent check with PSA (04/11/2024) was 0.30.  This indicates good control of patient's cancer.  Will continue with Zytiga  and prednisone  daily along with Eligard  30 mg injections every 4 months.  Heart murmur Patient has a loud, blowing, systolic murmur.  He does see cardiology on routine basis.  He had an echocardiogram on 04/17/2024.  Grade 3 diastolic dysfunction was noted with elevated left atrial pressure.  He has moderate to severe mitral valve regurgitation.  And he has had aortic valve replacement/repair in the  past.  He follows up with his cardiology provider later this month.  Suspect this may be contributing to lower extremity swelling.  Plan Labs reviewed. - Mild and stable anemia. - Mildly abnormal kidney functions. - Recent PSA (04/11/2024) was 0.30  Continue Zytiga  1000 mg and prednisone  5 mg daily Proceed with Eligard  30 mg injection today. Plan to follow-up with patient in 4 months with labs and Eligard  injection.  The patient understands the plans discussed today and is in agreement with them.  He knows to contact our office if he develops concerns prior to his next appointment.  I provided 25 minutes of face-to-face time during this encounter and > 50% was spent counseling as documented under my assessment and plan.    Powell FORBES Lessen, NP  [facility] - A DEPT OF [facility] [address] [employer] [address] Dept: [phone] Dept Fax: [phone]   No orders of the defined types were placed in this encounter.     CHIEF COMPLAINT:  CC: Malignant neoplasm of prostate  Current Treatment: Eligard  30 mg every 4 months and Zytiga  1000 mg daily  INTERVAL HISTORY:  Stacie is here today for repeat clinical assessment.  He last saw me on 12/23/2023.  He was referred to dermatology due to new, irregular lesion on his forearm.  He had PSA done through primary care on 04/11/2024.  Result was 0.30.  He reports good toleration of Zytiga  and prednisone  at home.  He does report some bilateral lower extremity swelling.  Patient's primary care believes it is related to the thyroid .  Has encouraged the patient to take his levothyroxine  separate from all other medications and we will recheck thyroid  in a few weeks.  The patient denies chest pain, chest pressure, or shortness of breath. He denies headaches or visual disturbances. He denies abdominal pain, nausea, vomiting, or changes in bowel or bladder habits.  He denies nocturia or  hematuria.  He denies fevers or chills. He denies pain. His appetite is good. His weight has increased 4 pounds over last 4 months.  I have reviewed the past medical history, past surgical history, social history and family history with the patient and they are unchanged from previous note.  ALLERGIES:  has no known allergies.  MEDICATIONS:  Current Outpatient Medications  Medication Sig Dispense Refill   abiraterone  acetate (ZYTIGA ) 250 MG tablet Take 4 tablets (1,000 mg total) by mouth daily. Take on an empty stomach 1 hour before or 2 hours after a meal 120 tablet 2   Alcohol Swabs (DROPSAFE ALCOHOL PREP) 70 % PADS AS DIRECTED 300 each 3   aspirin  EC 81 MG tablet Take 81 mg by mouth daily. Swallow whole.     atorvastatin  (LIPITOR ) 80 MG tablet TAKE 1 TABLET EVERY DAY 90 tablet 3   Blood Glucose Calibration (TRUE METRIX LEVEL 1) Low SOLN USE AS DIRECTED WITH GLUCOSE METER 1 each 3   calcium  carbonate (CALCIUM  600) 1500 (600 Ca) MG TABS tablet Take 0.8 tablets (1,200 mg total) by mouth daily. 30 tablet 0   cholecalciferol (VITAMIN D3) 25 MCG (1000 UT)  tablet Take 1,000 Units by mouth daily.     ferrous sulfate  325 (65 FE) MG EC tablet Take 1 tablet (325 mg total) by mouth 2 (two) times daily before a meal. 60 tablet 2   glipiZIDE  (GLUCOTROL  XL) 10 MG 24 hr tablet TAKE 1 TABLET TWICE DAILY 180 tablet 3   labetalol  (NORMODYNE ) 200 MG tablet TAKE 3 TABLETS EVERY MORNING AND TAKE 2 TABLETS EVERY EVENING 450 tablet 3   levothyroxine  (SYNTHROID ) 125 MCG tablet TAKE 1 TABLET EVERY DAY 90 tablet 3   lisinopril  (ZESTRIL ) 2.5 MG tablet Take 2 tablets (5 mg total) by mouth daily.     metFORMIN  (GLUCOPHAGE ) 1000 MG tablet TAKE 1 TABLET TWICE DAILY WITH MEALS 180 tablet 3   omeprazole  (PRILOSEC) 40 MG capsule TAKE 1 CAPSULE EVERY DAY 90 capsule 2   potassium chloride  SA (KLOR-CON  M) 20 MEQ tablet Take 1 tablet (20 mEq total) by mouth daily. 90 tablet 3   predniSONE  (DELTASONE ) 5 MG tablet Take 1 tablet  (5 mg total) by mouth daily with breakfast. 90 tablet 3   spironolactone  (ALDACTONE ) 25 MG tablet TAKE 1 TABLET EVERY OTHER DAY 45 tablet 2   torsemide  (DEMADEX ) 20 MG tablet TAKE 1 TABLET EVERY DAY 90 tablet 2   No current facility-administered medications for this visit.   Facility-Administered Medications Ordered in Other Visits  Medication Dose Route Frequency Provider Last Rate Last Admin   Leuprolide  Acetate (4 Month) (ELIGARD ) injection 30 mg  30 mg Subcutaneous Once Denell Cothern E, NP        HISTORY OF PRESENT ILLNESS:   Oncology History  Malignant neoplasm of prostate (HCC)  06/23/2018 Initial Diagnosis   Malignant neoplasm of prostate (HCC)   07/07/2018 - 10/20/2018 Chemotherapy   The patient had pegfilgrastim  (NEULASTA  ONPRO KIT) injection 6 mg, 6 mg, Subcutaneous, Once, 5 of 5 cycles Administration: 6 mg (07/28/2018), 6 mg (08/18/2018), 6 mg (09/08/2018), 6 mg (09/29/2018), 6 mg (10/20/2018) pegfilgrastim -cbqv (UDENYCA ) injection 6 mg, 6 mg, Subcutaneous, Once, 1 of 1 cycle Administration: 6 mg (07/08/2018) DOCEtaxel  (TAXOTERE ) 170 mg in sodium chloride  0.9 % 250 mL chemo infusion, 75 mg/m2 = 170 mg, Intravenous,  Once, 6 of 6 cycles Administration: 170 mg (07/07/2018), 170 mg (07/28/2018), 170 mg (08/18/2018), 170 mg (09/08/2018), 170 mg (09/29/2018), 170 mg (10/20/2018)  for chemotherapy treatment.        REVIEW OF SYSTEMS:   Constitutional: Denies fevers, chills or abnormal weight loss Eyes: Denies blurriness of vision Ears, nose, mouth, throat, and face: Denies mucositis or sore throat Respiratory: Denies cough, dyspnea or wheezes Cardiovascular: Denies palpitation or chest pain.  He does have some new, bilateral lower extremity swelling. Gastrointestinal:  Denies nausea, heartburn or change in bowel habits.  States he will have a day of loose stools every 2 to 3 weeks.  Has not needed to take Imodium for this. Skin: Denies abnormal skin rashes Lymphatics: Denies new  lymphadenopathy or easy bruising Neurological:Denies numbness, tingling or new weaknesses Behavioral/Psych: Mood is stable, no new changes  All other systems were reviewed with the patient and are negative.   VITALS:   Today's Vitals   04/27/24 0800 04/27/24 0831 04/27/24 0833  BP:  (!) 150/80 (!) 146/76  Pulse:  71   Resp:  17   Temp:  98 F (36.7 C)   TempSrc:  Temporal   SpO2:  98%   Weight:  210 lb 4.8 oz (95.4 kg)   Height:  5' 7 (1.702 m)  PainSc: 0-No pain     Body mass index is 32.94 kg/m.   Wt Readings from Last 3 Encounters:  04/27/24 210 lb 4.8 oz (95.4 kg)  04/11/24 204 lb (92.5 kg)  12/23/23 206 lb (93.4 kg)    Body mass index is 32.94 kg/m.  Performance status (ECOG): 1 - Symptomatic but completely ambulatory  PHYSICAL EXAM:   GENERAL:alert, no distress and comfortable SKIN: skin color, texture, turgor are normal, no rashes or significant lesions EYES: normal, Conjunctiva are pink and non-injected, sclera clear OROPHARYNX:no exudate, no erythema and lips, buccal mucosa, and tongue normal  NECK: supple, thyroid  normal size, non-tender, without nodularity LYMPH:  no palpable lymphadenopathy in the cervical, axillary or inguinal LUNGS: clear to auscultation and percussion with normal breathing effort HEART: regular rate & rhythm.  He has loud, blowing, systolic murmur.  Mild, nonpitting edema in bilateral feet. ABDOMEN:abdomen soft, non-tender and normal bowel sounds Musculoskeletal:no cyanosis of digits and no clubbing  NEURO: alert & oriented x 3 with fluent speech, no focal motor/sensory deficits  LABORATORY DATA:  I have reviewed the data as listed    Component Value Date/Time   NA 144 04/27/2024 0815   NA 144 11/30/2023 1329   K 4.6 04/27/2024 0815   CL 107 04/27/2024 0815   CO2 28 04/27/2024 0815   GLUCOSE 170 (H) 04/27/2024 0815   BUN 23 04/27/2024 0815   BUN 34 (H) 11/30/2023 1329   CREATININE 1.33 (H) 04/27/2024 0815   CREATININE 1.08  05/17/2022 0918   CALCIUM  9.2 04/27/2024 0815   PROT 6.1 (L) 04/27/2024 0815   PROT 6.2 06/30/2020 1516   ALBUMIN  4.0 04/27/2024 0815   ALBUMIN  3.7 06/30/2020 1516   AST 20 04/27/2024 0815   ALT 13 04/27/2024 0815   ALKPHOS 51 04/27/2024 0815   BILITOT 0.8 04/27/2024 0815   GFRNONAA 55 (L) 04/27/2024 0815   GFRAA 99 07/03/2020 1016   GFRAA >60 04/29/2020 0948    Lab Results  Component Value Date   WBC 7.7 04/27/2024   NEUTROABS 5.5 04/27/2024   HGB 10.0 (L) 04/27/2024   HCT 31.8 (L) 04/27/2024   MCV 88.8 04/27/2024   PLT 155 04/27/2024    RADIOGRAPHIC STUDIES: ECHOCARDIOGRAM COMPLETE Result Date: 04/17/2024    ECHOCARDIOGRAM REPORT   Patient Name:   Trindon Dorton. Date of Exam: 04/17/2024 Medical Rec #:  [id number]          Height:       67.0 in Accession #:    [id number]         Weight:       204.0 lb Date of Birth:  [birth year]         BSA:          2.039 m Patient Age:    77 years           BP:           140/70 mmHg Patient Gender: M                  HR:           63 bpm. Exam Location:  [address] Procedure: 2D Echo, Cardiac Doppler and Color Doppler (Both Spectral and Color            Flow Doppler were utilized during procedure). Indications:    S/p AVR Z95.2  History:        Patient has prior history of Echocardiogram examinations, most  recent 03/15/2023. S/p AVR (23mm Human aortic root graft); Risk                 Factors:Hypertension, Diabetes and Dyslipidemia.  Sonographer:    Elsie Bohr RDCS Referring Phys: [phone] MICHAEL COOPER IMPRESSIONS  1. Left ventricular ejection fraction, by estimation, is 60 to 65%. The left ventricle has normal function. The left ventricle has no regional wall motion abnormalities. Left ventricular diastolic parameters are consistent with Grade III diastolic dysfunction (restrictive). Elevated left atrial pressure. The E/e' is 18.  2. Right ventricular systolic function is mildly reduced. The right ventricular size is normal.  There is moderately elevated pulmonary artery systolic pressure.  3. Left atrial size was mildly dilated.  4. The mitral valve is normal in structure. Moderate to severe mitral valve regurgitation. No evidence of mitral stenosis.  5. Tricuspid valve regurgitation is moderate.  6. Status post 23 mm human allograft aortic root repair (procedure date 05/15/2020). 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 Persistent flow visualized from the aortic root into the main PA just  above the level of the pulmonic valve consistent with aortopulmonary fistula. Aortic valve leaflets are not well-visualized. No valvular stenosis. Mild to moderate aortic regurgitation. . Aortic valve area, by VTI measures 1.94 cm. Aortic valve mean gradient measures 5.0 mmHg. Aortic valve Vmax measures 1.60 m/s. Aortic valve acceleration time measures 86 msec.  7. Aortic root/ascending aorta has been repaired/replaced.  8. The inferior vena cava is normal in size with greater than 50% respiratory variability, suggesting right atrial pressure of 3 mmHg. Comparison(s): A prior study was performed on 03/15/2023. LVEF remains preserved, grade 3 diastolic dysfunction and elevated left atrial pressures are new findings, RVSP has increased from 26 mmHg to 49 mmHg, right ventricular function is mildly reduced which is new. Mild AR is now mild/moderate and mild MR is now moderate/severe with systolic pulmonary vein blunting. No obvious evidence of aortic root abscess, consider transesophageal echocardiogram for further evaluation if clinically warranted. FINDINGS  Left Ventricle: Left ventricular ejection fraction, by estimation, is 60 to 65%. The left ventricle has normal function. The left ventricle has no regional wall motion abnormalities. The left ventricular internal cavity size was normal in size. There is  no left ventricular hypertrophy of the basal-septal segment. Left ventricular diastolic  parameters are consistent with Grade III diastolic dysfunction (restrictive). Elevated left atrial pressure. The E/e' is 74. Right Ventricle: The right ventricular size is normal. No increase in right ventricular wall thickness. Right ventricular systolic function is mildly reduced. There is moderately elevated pulmonary artery systolic pressure. The tricuspid regurgitant velocity is 3.40 m/s, and with an assumed right atrial pressure of 3 mmHg, the estimated right ventricular systolic pressure is 49.2 mmHg. Left Atrium: Left atrial size was mildly dilated. Right Atrium: Right atrial size was normal in size. Pericardium: There is no evidence of pericardial effusion. Mitral Valve: The mitral valve is normal in structure. Moderate to severe mitral valve regurgitation. No evidence of mitral valve stenosis. Tricuspid Valve: The tricuspid valve is normal in structure. Tricuspid valve regurgitation is moderate . No evidence of tricuspid stenosis. Aortic Valve: Status post 23 mm human allograft aortic root repair (procedure date 05/15/2020). 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 Persistent flow visualized from the aortic root into the main PA just above the level of the pulmonic valve consistent with aortopulmonary fistula. Aortic valve leaflets  are not well-visualized. No valvular stenosis. Mild to moderate aortic regurgitation. Aortic regurgitation PHT measures 511 msec. Aortic valve mean gradient measures 5.0 mmHg. Aortic valve peak gradient measures 10.2 mmHg. Aortic valve area, by VTI measures 1.94 cm. Pulmonic Valve: The pulmonic valve was grossly normal. Pulmonic valve regurgitation is mild. No evidence of pulmonic stenosis. Aorta: The aortic root/ascending aorta has been repaired/replaced and the aortic root was not well visualized. Venous: The inferior vena cava is normal in size with greater than 50% respiratory variability, suggesting right atrial  pressure of 3 mmHg. IAS/Shunts: There is right bowing of the interatrial septum, suggestive of elevated left atrial pressure. The atrial septum is grossly normal.  LEFT VENTRICLE PLAX 2D LVIDd:         4.80 cm   Diastology LVIDs:         3.20 cm   LV e' medial:    6.09 cm/s LV PW:         1.10 cm   LV E/e' medial:  23.6 LV IVS:        1.30 cm   LV e' lateral:   12.17 cm/s LVOT diam:     2.20 cm   LV E/e' lateral: 11.8 LV SV:         64 LV SV Index:   32 LVOT Area:     3.80 cm  RIGHT VENTRICLE            IVC RV S prime:     8.56 cm/s  IVC diam: 1.10 cm TAPSE (M-mode): 1.3 cm RVSP:           49.2 mmHg LEFT ATRIUM             Index        RIGHT ATRIUM           Index LA diam:        5.60 cm 2.75 cm/m   RA Pressure: 3.00 mmHg LA Vol (A2C):   71.5 ml 35.06 ml/m  RA Area:     13.90 cm LA Vol (A4C):   86.8 ml 42.56 ml/m  RA Volume:   28.30 ml  13.88 ml/m LA Biplane Vol: 79.9 ml 39.18 ml/m  AORTIC VALVE AV Area (Vmax):    1.86 cm AV Area (Vmean):   1.83 cm AV Area (VTI):     1.94 cm AV Vmax:           159.80 cm/s AV Vmean:          100.820 cm/s AV VTI:            0.331 m AV Peak Grad:      10.2 mmHg AV Mean Grad:      5.0 mmHg LVOT Vmax:         78.20 cm/s LVOT Vmean:        48.540 cm/s LVOT VTI:          0.169 m LVOT/AV VTI ratio: 0.51 AI PHT:            511 msec  AORTA Ao Asc diam: 3.50 cm MITRAL VALVE                TRICUSPID VALVE MV Area (PHT): 4.07 cm     TR Peak grad:   46.2 mmHg MV Decel Time: 187 msec     TR Vmax:        340.00 cm/s MV E velocity: 143.50 cm/s  Estimated RAP:  3.00 mmHg MV A velocity: 56.60  cm/s   RVSP:           49.2 mmHg MV E/A ratio:  2.54                             SHUNTS                             Systemic VTI:  0.17 m                             Systemic Diam: 2.20 cm Sunit Tolia Electronically signed by Madonna Large Signature Date/Time: 04/17/2024/1:10:10 PM    Final

## 2024-04-27 ENCOUNTER — Inpatient Hospital Stay: Admitting: Nurse Practitioner

## 2024-04-27 ENCOUNTER — Inpatient Hospital Stay: Attending: Nurse Practitioner

## 2024-04-27 ENCOUNTER — Encounter: Payer: Self-pay | Admitting: Hematology

## 2024-04-27 ENCOUNTER — Encounter: Payer: Self-pay | Admitting: Nurse Practitioner

## 2024-04-27 ENCOUNTER — Encounter (HOSPITAL_COMMUNITY): Payer: Self-pay | Admitting: Internal Medicine

## 2024-04-27 ENCOUNTER — Inpatient Hospital Stay

## 2024-04-27 VITALS — BP 146/76 | HR 71 | Temp 98.0°F | Resp 17 | Ht 67.0 in | Wt 210.3 lb

## 2024-04-27 DIAGNOSIS — D649 Anemia, unspecified: Secondary | ICD-10-CM | POA: Diagnosis not present

## 2024-04-27 DIAGNOSIS — C61 Malignant neoplasm of prostate: Secondary | ICD-10-CM

## 2024-04-27 DIAGNOSIS — E039 Hypothyroidism, unspecified: Secondary | ICD-10-CM | POA: Diagnosis not present

## 2024-04-27 DIAGNOSIS — Z79899 Other long term (current) drug therapy: Secondary | ICD-10-CM | POA: Diagnosis not present

## 2024-04-27 LAB — CMP (CANCER CENTER ONLY)
ALT: 13 U/L (ref 0–44)
AST: 20 U/L (ref 15–41)
Albumin: 4 g/dL (ref 3.5–5.0)
Alkaline Phosphatase: 51 U/L (ref 38–126)
Anion gap: 9 (ref 5–15)
BUN: 23 mg/dL (ref 8–23)
CO2: 28 mmol/L (ref 22–32)
Calcium: 9.2 mg/dL (ref 8.9–10.3)
Chloride: 107 mmol/L (ref 98–111)
Creatinine: 1.33 mg/dL — ABNORMAL HIGH (ref 0.61–1.24)
GFR, Estimated: 55 mL/min — ABNORMAL LOW (ref >60)
Glucose, Bld: 170 mg/dL — ABNORMAL HIGH (ref 70–99)
Potassium: 4.6 mmol/L (ref 3.5–5.1)
Sodium: 144 mmol/L (ref 135–145)
Total Bilirubin: 0.8 mg/dL (ref 0.0–1.2)
Total Protein: 6.1 g/dL — ABNORMAL LOW (ref 6.5–8.1)

## 2024-04-27 LAB — CBC WITH DIFFERENTIAL (CANCER CENTER ONLY)
Abs Immature Granulocytes: 0.03 K/uL (ref 0.00–0.07)
Basophils Absolute: 0.1 K/uL (ref 0.0–0.1)
Basophils Relative: 1 %
Eosinophils Absolute: 0.1 K/uL (ref 0.0–0.5)
Eosinophils Relative: 2 %
HCT: 31.8 % — ABNORMAL LOW (ref 39.0–52.0)
Hemoglobin: 10 g/dL — ABNORMAL LOW (ref 13.0–17.0)
Immature Granulocytes: 0 %
Lymphocytes Relative: 18 %
Lymphs Abs: 1.4 K/uL (ref 0.7–4.0)
MCH: 27.9 pg (ref 26.0–34.0)
MCHC: 31.4 g/dL (ref 30.0–36.0)
MCV: 88.8 fL (ref 80.0–100.0)
Monocytes Absolute: 0.6 K/uL (ref 0.1–1.0)
Monocytes Relative: 7 %
Neutro Abs: 5.5 K/uL (ref 1.7–7.7)
Neutrophils Relative %: 72 %
Platelet Count: 155 K/uL (ref 150–400)
RBC: 3.58 MIL/uL — ABNORMAL LOW (ref 4.22–5.81)
RDW: 15.2 % (ref 11.5–15.5)
WBC Count: 7.7 K/uL (ref 4.0–10.5)
nRBC: 0 % (ref 0.0–0.2)

## 2024-04-27 MED ORDER — LEUPROLIDE ACETATE (4 MONTH) 30 MG ~~LOC~~ KIT
30.0000 mg | PACK | Freq: Once | SUBCUTANEOUS | Status: AC
  Administered 2024-04-27: 30 mg via SUBCUTANEOUS
  Filled 2024-04-27: qty 30

## 2024-04-28 LAB — PROSTATE-SPECIFIC AG, SERUM (LABCORP): Prostate Specific Ag, Serum: <0.1 ng/mL (ref 0.0–4.0)

## 2024-05-07 DIAGNOSIS — E118 Type 2 diabetes mellitus with unspecified complications: Secondary | ICD-10-CM | POA: Diagnosis not present

## 2024-05-09 ENCOUNTER — Encounter: Payer: Self-pay | Admitting: Cardiovascular Disease

## 2024-05-09 ENCOUNTER — Ambulatory Visit: Attending: Cardiology | Admitting: Cardiovascular Disease

## 2024-05-09 VITALS — BP 110/80 | HR 56 | Ht 67.0 in | Wt 207.4 lb

## 2024-05-09 DIAGNOSIS — Q2543 Congenital aneurysm of aorta: Secondary | ICD-10-CM

## 2024-05-09 DIAGNOSIS — I10 Essential (primary) hypertension: Secondary | ICD-10-CM

## 2024-05-09 DIAGNOSIS — I5032 Chronic diastolic (congestive) heart failure: Secondary | ICD-10-CM

## 2024-05-09 MED ORDER — TORSEMIDE 40 MG PO TABS: 40.0000 mg | ORAL_TABLET | Freq: Every day | ORAL | 3 refills | Status: DC

## 2024-05-09 MED ORDER — LISINOPRIL 5 MG PO TABS: 5.0000 mg | ORAL_TABLET | Freq: Every day | ORAL | 3 refills | Status: DC

## 2024-05-09 NOTE — Patient Instructions (Addendum)
 Medication Instructions:  INCREASE Torsemide  to 40 mg once daily  Refill for Lisinopril  5 mg once daily has been sent to [facility].  *If you need a refill on your cardiac medications before your next appointment, please call your pharmacy*  Lab Work: To be completed in 3-4 weeks: BMP (approximately 10/8-10/15)  If you have labs (blood work) drawn today and your tests are completely normal, you will receive your results only by: MyChart Message (if you have MyChart) OR A paper copy in the mail If you have any lab test that is abnormal or we need to change your treatment, we will call you to review the results.  Testing/Procedures: None ordered today.  Follow-Up: At [facility], you and your health needs are our priority.  As part of our continuing mission to provide you with exceptional heart care, our providers are all part of one team.  This team includes your primary Cardiologist (physician) and Advanced Practice Providers or APPs (Physician Assistants and Nurse Practitioners) who all work together to provide you with the care you need, when you need it.  Your next appointment:   3 month(s)  Provider:   One of our Advanced Practice Providers (APPs): Morse Clause, PA-C  Lamarr Satterfield, NP Miriam Shams, NP  Olivia Pavy, PA-C Josefa Beauvais, NP  Leontine Salen, PA-C Orren Fabry, PA-C  [place], PA-C Ernest Dick, NP  Damien Braver, NP Jon Hails, PA-C  Waddell Donath, PA-C    Dayna Dunn, PA-C  Scott Weaver, PA-C Lum Louis, NP Katlyn West, NP Callie Goodrich, PA-C  Xika Zhao, NP Sheng Haley, PA-C    Kathleen Johnson, PA-C

## 2024-05-09 NOTE — Assessment & Plan Note (Signed)
 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  He has been seen in formal cardiac surgical consult and is not a candidate for redo sternotomy and corrective surgery.  I discussed with him referral to a tertiary center for complex cardiac surgery, but we both agree that this would be an extremely challenging surgery for him to have a good functional recovery.  He favors a conservative approach and understands that his symptoms will likely progress with increased degree of heart failure and functional limitation related to this.

## 2024-05-09 NOTE — Progress Notes (Signed)
 Cardiology Office Note:    Date:  05/09/2024   ID:  Matthew Serrano Nicholaus Mickey., DOB Dec 25, 1945, MRN [id number]  PCP:  Merna Huxley, NP   [facility] Providers Cardiologist:  Ozell Fell, MD Cardiology APP:  Lelon Glendia ONEIDA DEVONNA     Referring MD: Merna Huxley, NP   Chief Complaint  Patient presents with   Shortness of Breath    History of Present Illness:    Matthew Serrano. is a 78 y.o. 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  He continues to remain functionally independent.  He denies chest pain or pressure.  He reports progressive dyspnea with exertion and ankle swelling left greater than right.  No orthopnea or PND.  He is compliant with his medications.  He will skip torsemide  on days that he has to be out.  Otherwise doing okay with no fevers, chills, or other complaints today.  Current Medications: Current Meds  Medication Sig   abiraterone  acetate (ZYTIGA ) 250 MG tablet Take 4 tablets (1,000 mg total) by mouth daily. Take on an empty stomach 1 hour before or 2 hours after a meal   Alcohol Swabs (DROPSAFE ALCOHOL PREP) 70 % PADS AS DIRECTED   aspirin  EC 81 MG tablet Take 81 mg by mouth daily. Swallow whole.   atorvastatin   (LIPITOR ) 80 MG tablet TAKE 1 TABLET EVERY DAY   Blood Glucose Calibration (TRUE METRIX LEVEL 1) Low SOLN USE AS DIRECTED WITH GLUCOSE METER   calcium  carbonate (CALCIUM  600) 1500 (600 Ca) MG TABS tablet Take 0.8 tablets (1,200 mg total) by mouth daily.   cholecalciferol (VITAMIN D3) 25 MCG (1000 UT) tablet Take 1,000 Units by mouth daily.   ferrous sulfate  325 (65 FE) MG EC tablet Take 1 tablet (325 mg total) by mouth 2 (two) times daily before a meal.   glipiZIDE  (GLUCOTROL  XL) 10 MG 24 hr tablet TAKE 1 TABLET TWICE DAILY   labetalol  (NORMODYNE ) 200 MG tablet TAKE 3 TABLETS EVERY MORNING AND TAKE 2 TABLETS EVERY EVENING   levothyroxine  (SYNTHROID ) 125 MCG tablet TAKE 1 TABLET EVERY DAY   lisinopril  (ZESTRIL ) 5 MG tablet Take 1 tablet (5 mg total) by mouth daily.   metFORMIN  (GLUCOPHAGE ) 1000 MG tablet TAKE 1 TABLET TWICE DAILY WITH MEALS   omeprazole  (PRILOSEC) 40 MG capsule TAKE 1 CAPSULE EVERY DAY   potassium chloride  SA (KLOR-CON  M) 20 MEQ tablet Take 1 tablet (20 mEq total) by mouth daily.   predniSONE  (DELTASONE ) 5 MG tablet Take 1 tablet (5 mg total) by mouth daily with breakfast.   spironolactone  (ALDACTONE ) 25 MG tablet TAKE 1 TABLET EVERY OTHER DAY   [DISCONTINUED] lisinopril  (ZESTRIL ) 2.5 MG tablet Take 2 tablets (5 mg total) by mouth daily.   [DISCONTINUED] torsemide  (DEMADEX ) 20 MG tablet TAKE 1 TABLET EVERY DAY  Allergies:   Patient has no known allergies.   ROS:   Please see the history of present illness.    All other systems reviewed and are negative.  EKGs/Labs/Other Studies Reviewed:    The following studies were reviewed today: Cardiac Studies & Procedures   ______________________________________________________________________________________________ CARDIAC CATHETERIZATION  CARDIAC CATHETERIZATION 02/13/2020  Conclusion  Calcific aortic stenosis with mean aortic valve gradient 28 mmHg.  Aortic valve area 2.37 cm based upon dynamic expectedly high cardiac  output of 10.3 L/min.  Widely patent coronary arteries.  LAD with eccentric 40 to 50% mid to distal narrowing..  Eccentric 50% mid RCA.  50% mid circumflex in the segment beyond the first obtuse marginal.  Normal right heart pressures.  Normal pulmonary capillary wedge pressure, mean 8 mmHg.  RECOMMENDATIONS:   Will forward information to Dr. Wonda.  Findings Coronary Findings Diagnostic  Dominance: Right  Left Anterior Descending Mid LAD lesion is 50% stenosed.  Left Circumflex Prox Cx to Mid Cx lesion is 50% stenosed.  Right Coronary Artery Mid RCA lesion is 45% stenosed.  Intervention  No interventions have been documented.   STRESS TESTS  MYOCARDIAL PERFUSION IMAGING 02/10/2022  Interpretation Summary   The study is normal. The study is low risk.   No ST deviation was noted.   LV perfusion is normal. There is no evidence of ischemia. There is no evidence of infarction.   Left ventricular function is normal. Nuclear stress EF: 69 %. The left ventricular ejection fraction is hyperdynamic (>65%). End diastolic cavity size is mildly enlarged. End systolic cavity size is normal.   Prior study available for comparison from 03/25/2017.   ECHOCARDIOGRAM  ECHOCARDIOGRAM COMPLETE 04/17/2024  Narrative ECHOCARDIOGRAM REPORT    Patient Name:   Matthew Serrano. Date of Exam: 04/17/2024 Medical Rec #:  [id number]          Height:       67.0 in Accession #:    [id number]         Weight:       204.0 lb Date of Birth:  February 18, 1946         BSA:          2.039 m Patient Age:    77 years           BP:           140/70 mmHg Patient Gender: M                  HR:           63 bpm. Exam Location:  [address]  Procedure: 2D Echo, Cardiac Doppler and Color Doppler (Both Spectral and Color Flow Doppler were utilized during procedure).  Indications:    S/p AVR Z95.2  History:        Patient has prior history of Echocardiogram examinations, most recent 03/15/2023. S/p AVR  (23mm Human aortic root graft); Risk Factors:Hypertension, Diabetes and Dyslipidemia.  Sonographer:    Elsie Bohr RDCS Referring Phys: [phone] Nithya Meriweather  IMPRESSIONS   1. Left ventricular ejection fraction, by estimation, is 60 to 65%. The left ventricle has normal function. The left ventricle has no regional wall motion abnormalities. Left ventricular diastolic parameters are consistent with Grade III diastolic dysfunction (restrictive). Elevated left atrial pressure. The E/e' is 18. 2. Right ventricular systolic function is mildly reduced. The right ventricular size is normal. There is moderately elevated pulmonary artery systolic pressure. 3. Left atrial size was mildly dilated. 4. The mitral  valve is normal in structure. Moderate to severe mitral valve regurgitation. No evidence of mitral stenosis. 5. Tricuspid valve regurgitation is moderate. 6. Status post 23 mm human allograft aortic root repair (procedure date 05/15/2020). 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 Persistent flow visualized from the aortic root into the main PA just above the level of the pulmonic valve consistent with aortopulmonary fistula. Aortic valve leaflets are not well-visualized. No valvular stenosis. Mild to moderate aortic regurgitation. . Aortic valve area, by VTI measures 1.94 cm. Aortic valve mean gradient measures 5.0 mmHg. Aortic valve Vmax measures 1.60 m/s. Aortic valve acceleration time measures 86 msec. 7. Aortic root/ascending aorta has been repaired/replaced. 8. The inferior vena cava is normal in size with greater than 50% respiratory variability, suggesting right atrial pressure of 3 mmHg.  Comparison(s): A prior study was performed on 03/15/2023. LVEF remains preserved, grade 3 diastolic dysfunction and elevated left atrial pressures are new findings, RVSP has increased from 26 mmHg to 49 mmHg, right ventricular function is mildly  reduced which is new. Mild AR is now mild/moderate and mild MR is now moderate/severe with systolic pulmonary vein blunting. No obvious evidence of aortic root abscess, consider transesophageal echocardiogram for further evaluation if clinically warranted.  FINDINGS Left Ventricle: Left ventricular ejection fraction, by estimation, is 60 to 65%. The left ventricle has normal function. The left ventricle has no regional wall motion abnormalities. The left ventricular internal cavity size was normal in size. There is no left ventricular hypertrophy of the basal-septal segment. Left ventricular diastolic parameters are consistent with Grade III diastolic dysfunction (restrictive). Elevated left atrial pressure. The E/e' is 53.  Right Ventricle: The right ventricular size is normal. No increase in right ventricular wall thickness. Right ventricular systolic function is mildly reduced. There is moderately elevated pulmonary artery systolic pressure. The tricuspid regurgitant velocity is 3.40 m/s, and with an assumed right atrial pressure of 3 mmHg, the estimated right ventricular systolic pressure is 49.2 mmHg.  Left Atrium: Left atrial size was mildly dilated.  Right Atrium: Right atrial size was normal in size.  Pericardium: There is no evidence of pericardial effusion.  Mitral Valve: The mitral valve is normal in structure. Moderate to severe mitral valve regurgitation. No evidence of mitral valve stenosis.  Tricuspid Valve: The tricuspid valve is normal in structure. Tricuspid valve regurgitation is moderate . No evidence of tricuspid stenosis.  Aortic Valve: Status post 23 mm human allograft aortic root repair (procedure date 05/15/2020). 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 Persistent flow visualized from the aortic root into the main PA just above the level of the pulmonic valve consistent with aortopulmonary fistula. Aortic valve  leaflets are not well-visualized. No valvular stenosis. Mild to moderate aortic regurgitation. Aortic regurgitation PHT measures 511 msec. Aortic valve mean gradient measures 5.0 mmHg. Aortic valve peak gradient measures 10.2 mmHg. Aortic valve area, by VTI measures 1.94 cm.  Pulmonic Valve: The pulmonic valve was grossly normal. Pulmonic valve regurgitation is mild. No evidence of pulmonic stenosis.  Aorta: The aortic root/ascending aorta has been repaired/replaced and the aortic root was not well visualized.  Venous: The inferior vena cava is normal in size with greater than 50% respiratory variability, suggesting right atrial pressure of 3 mmHg.  IAS/Shunts: There is right bowing of the interatrial septum, suggestive of elevated left atrial pressure. The atrial septum is grossly normal.   LEFT VENTRICLE  PLAX 2D LVIDd:         4.80 cm   Diastology LVIDs:         3.20 cm   LV e' medial:    6.09 cm/s LV PW:         1.10 cm   LV E/e' medial:  23.6 LV IVS:        1.30 cm   LV e' lateral:   12.17 cm/s LVOT diam:     2.20 cm   LV E/e' lateral: 11.8 LV SV:         64 LV SV Index:   32 LVOT Area:     3.80 cm   RIGHT VENTRICLE            IVC RV S prime:     8.56 cm/s  IVC diam: 1.10 cm TAPSE (M-mode): 1.3 cm RVSP:           49.2 mmHg  LEFT ATRIUM             Index        RIGHT ATRIUM           Index LA diam:        5.60 cm 2.75 cm/m   RA Pressure: 3.00 mmHg LA Vol (A2C):   71.5 ml 35.06 ml/m  RA Area:     13.90 cm LA Vol (A4C):   86.8 ml 42.56 ml/m  RA Volume:   28.30 ml  13.88 ml/m LA Biplane Vol: 79.9 ml 39.18 ml/m AORTIC VALVE AV Area (Vmax):    1.86 cm AV Area (Vmean):   1.83 cm AV Area (VTI):     1.94 cm AV Vmax:           159.80 cm/s AV Vmean:          100.820 cm/s AV VTI:            0.331 m AV Peak Grad:      10.2 mmHg AV Mean Grad:      5.0 mmHg LVOT Vmax:         78.20 cm/s LVOT Vmean:        48.540 cm/s LVOT VTI:          0.169 m LVOT/AV VTI ratio: 0.51 AI  PHT:            511 msec  AORTA Ao Asc diam: 3.50 cm  MITRAL VALVE                TRICUSPID VALVE MV Area (PHT): 4.07 cm     TR Peak grad:   46.2 mmHg MV Decel Time: 187 msec     TR Vmax:        340.00 cm/s MV E velocity: 143.50 cm/s  Estimated RAP:  3.00 mmHg MV A velocity: 56.60 cm/s   RVSP:           49.2 mmHg MV E/A ratio:  2.54 SHUNTS Systemic VTI:  0.17 m Systemic Diam: 2.20 cm  Sunit Tolia Electronically signed by Madonna Large Signature Date/Time: 04/17/2024/1:10:10 PM    Final   TEE  ECHO TEE 03/03/2022  Narrative TRANSESOPHOGEAL ECHO REPORT    Patient Name:   Crosley Stejskal. Date of Exam: 03/03/2022 Medical Rec #:  [id number]          Height:       68.0 in Accession #:    [id number]         Weight:  200.0 lb Date of Birth:  Feb 11, 1946         BSA:          2.044 m Patient Age:    39 years           BP:           177/85 mmHg Patient Gender: M                  HR:           53 bpm. Exam Location:  Inpatient  Procedure: 3D Echo, Transesophageal Echo, Cardiac Doppler and Color Doppler  Indications:     Endocarditis  History:         Patient has prior history of Echocardiogram examinations, most recent 02/10/2022. CAD, Pulmonary HTN, Aortic Valve Disease, Signs/Symptoms:Chest Pain; Risk Factors:Hypertension and Diabetes. ETOH. Aortic/pulmonary fistula.  Sonographer:     Ellouise Mose RDCS Referring Phys:  [id number] ALINE Serrano DOOR Diagnosing Phys: Powell Sorrow MD   Sonographer Comments: Technically difficult study due to poor echo windows.   PROCEDURE: After discussion of the risks and benefits of a TEE, an informed consent was obtained from the patient. The transesophogeal probe was passed without difficulty through the esophogus of the patient. Imaged were obtained with the patient in a left lateral decubitus position. Sedation performed by different physician. The patient was monitored while under deep sedation. Anesthestetic sedation was provided  intravenously by Anesthesiology: 557mg  of Propofol . The patient's vital signs; including heart rate, blood pressure, and oxygen  saturation; remained stable throughout the procedure. Supplementary images were obtained from transthoracic windows as indicated to answer the clinical question. The patient developed no complications during the procedure.  IMPRESSIONS   1. The patient is s/p 23mm human allograft aortic root repair. 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 There is persistent flow visualized from the aortic root into the main PA just above the level of the pulmonic valve consistent with a persistent aorto-pulmonary fistula. The aortc valve leaflets have normal mobility with no vegetations visualized. There is mild central aortic regurgitation. The aortic valve has been repaired/replaced. 2. Left ventricular ejection fraction, by estimation, is 65 to 70%. The left ventricle has normal function. 3. Right ventricular systolic function is normal. The right ventricular size is normal. 4. Left atrial size was moderately dilated. No left atrial/left atrial appendage thrombus was detected. 5. The mitral valve is grossly normal. Trivial mitral valve regurgitation. No evidence of mitral stenosis. 6. Aortic root/ascending aorta has been repaired/replaced. There is Moderate (Grade III) plaque. 7. Evidence of atrial level shunting detected by color flow Doppler. Agitated saline contrast bubble study was positive with shunting observed within 3-6 cardiac cycles suggestive of interatrial shunt.  FINDINGS Left Ventricle: Left ventricular ejection fraction, by estimation, is 65 to 70%. The left ventricle has normal function. The left ventricular internal cavity size was normal in  size.  Right Ventricle: The right ventricular size is normal. No increase in right ventricular wall thickness. Right ventricular systolic function is normal.  Left Atrium: Left atrial size was moderately dilated. No left atrial/left atrial appendage thrombus was detected.  Right Atrium: Right atrial size was normal in size.  Pericardium: There is no evidence of pericardial effusion.  Mitral Valve: The mitral valve is grossly  normal. There is mild thickening of the mitral valve leaflet(s). There is mild calcification of the mitral valve leaflet(s). Trivial mitral valve regurgitation. No evidence of mitral valve stenosis.  Tricuspid Valve: The tricuspid valve is normal in structure. Tricuspid valve regurgitation is trivial.  Aortic Valve: The patient is s/p 23mm human allograft aortic root repair. 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 There is persistent flow visualized from the aortic root into the main PA just above the level of the pulmonic valve consistent with a persistent aorto-pulmonary fistula. The aortc valve leaflets have normal mobility with no vegetations visualized. There is mild central aortic regurgitation. The aortic valve has been repaired/replaced. Aortic valve mean gradient measures 2.0 mmHg. Aortic valve peak gradient measures 4.9 mmHg.  Pulmonic Valve: The pulmonic valve was normal in structure. Pulmonic valve regurgitation is trivial.  Aorta: The aortic root/ascending aorta has been repaired/replaced. There is moderate (Grade III) plaque.  IAS/Shunts: The interatrial septum is aneurysmal. Evidence of atrial level shunting detected by color flow Doppler. Agitated saline contrast bubble study was positive with shunting observed within 3-6 cardiac  cycles suggestive of interatrial shunt.   LEFT VENTRICLE PLAX 2D LVOT diam:     2.10 cm LVOT Area:     3.46 cm   AORTIC VALVE AV Vmax:      111.00 cm/s AV Vmean:     67.700 cm/s AV VTI:       0.230 m AV Peak Grad: 4.9 mmHg AV Mean Grad: 2.0 mmHg   SHUNTS Systemic Diam: 2.10 cm  Powell Sorrow MD Electronically signed by Powell Sorrow MD Signature Date/Time: 03/03/2022/4:49:03 PM    Final (Updated)    CT SCANS  CT CORONARY MORPH W/CTA COR W/SCORE 04/19/2023  Addendum 04/20/2023  4:30 PM ADDENDUM REPORT: 04/20/2023 16:28  EXAM: OVER-READ INTERPRETATION  CT CHEST  The following report is an over-read performed by radiologist Dr. Elsie Falco [facility] on 04/20/2023. This over-read does not include interpretation of cardiac or coronary anatomy or pathology. The cardiac CTA interpretation by the cardiologist is attached.  COMPARISON:  Cardiac CT dated September 06, 2022.  FINDINGS: Vascular: There are no significant non-cardiac vascular findings.  Mediastinum/Nodes: There are no enlarged lymph nodes.The visualized esophagus demonstrates no significant findings.  Lungs/Pleura: Clear lungs. No pneumothorax or pleural effusion.  Upper abdomen: No acute abnormality.  Musculoskeletal/Chest wall: No chest wall abnormality. No acute or significant osseous findings.  IMPRESSION: 1. No significant extracardiac findings.   Electronically Signed By: Elsie ONEIDA Shoulder M.D. On: 04/20/2023 16:28  Addendum 04/20/2023  3:26 PM ADDENDUM REPORT: 04/20/2023 15:24  ADDENDUM: Revision for clarification:  2. Slightly more pronounced systolic compression of the anterior portion of the 23 mm valved conduit, likely secondary to flow into the native sinus around the conduit, seen previously on serial imaging studies. Correlate with echocardiogram for any impact on valve function. Grossly normal valve leaflet excursion by CT. No thickening or  restriction.   Electronically Signed By: Soyla Merck M.D. 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 This an interval follow up exam.  COMPARISON:  CT angiography heart 09/06/2022, 02/25/2022  TEE 03/03/2022  Echocardiogram 09/24/2022, 02/10/2022, 12/24/2020, 06/24/2020  Intraoperative TEE 05/15/2020  EXAM: Cardiac ECG  gated CT angiogram  TECHNIQUE: The patient was scanned on a Siemens Force 192 slice scanner. A 120 kV retrospective scan was triggered in the descending thoracic aorta at 111 HU's. Gantry rotation speed was 270 msecs and collimation was .9 mm. The 3D data set was reconstructed in 5% intervals of the R-R cycle. Systolic and diastolic phases were analyzed on a dedicated work station using MPR, MIP and VRT modes. The patient received 100mL OMNIPAQUE  IOHEXOL  350 MG/ML SOLN of contrast.  FINDINGS: Aortic valve:  23 mm human allograft valve conduit is in place in the aortic position and aortic root. A large pseudoaneurysm is present at the  level of the valve extending into the LVOT adjacent to the right and left coronary cusps originating at level of the prosthetic right and  left cusp commissure. The main body of the pseudoaneurysm measures approximately 36.5 X 27 x 13.5 mm (prior exam 35 x 26 x 10 mm). Mildly enlarged dimensions compared to the prior exam from 09/06/22. This measurement does not include the fistulous connection from the pseudoaneurysm to the pulmonary artery at the level of the pulmonary valve with flow predominantly in systole from the aortic root/LVOT pseudoaneurysm into the pulmonary artery (left-to-right shunt). See screen captures in PACS for visualization of pseudoaneurysm and fistula to the PA. This is grossly unchanged in size, shape or location, and flow remains left to right. Prosthetic aortic valve leaflet motion is normal with no significant  leaflet thickening and no restriction. There is however slightly more pronounced systolic compression of the anterior portion of the conduit by the pseudoaneurysm. Correlate with echocardiogram for any impact on valve function.  AORTA:  The aortic root conduit is grossly normal in size and appearance above the level of the valve. Stable in appearance compared to prior  as well. Above the level of the aortic root repair at the mid ascending aorta, aorta is normal caliber but not assessed above conduit anastomosis.  PULMONARY ARTERY  Moderate dilated main pulmonary artery, 37 mm, unchanged from prior. Contrast mixing is seen at the level of the pulmonary valve, consistent with findings of aortic root/LVOT pseudoaneurysm fistula to the pulmonary artery.  CORONARY ARTERIES:  Normal coronary artery origins. Right dominant circulation. Study was not protocoled to evaluate coronary arteries and nitroglycerin   was not given, coronary arteries were not fully assessed on this exam, and are better evaluated on study from February 25, 2022. Left main  and 3 vessel coronary artery calcifications are seen.  PULMONARY VEINS  Normal pulmonary vein anatomy, no anomalous pulmonary venous drainage.  ATRIA  Moderately dilated left atrial chamber size. Normal right atrial chamber size  OTHER: No other significant findings.  Non-cardiac: See separate report from [facility].  IMPRESSION: 1. 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 Similar location and shape, no significant extension. See findings for measurements.  2. Slightly more pronounced systolic compression of the anterior portion of the 23 mm valved conduit by the pseudoaneurysm. Correlate with echocardiogram for any impact on valve function. Grossly normal valve leaflet excursion by CT. No thickening or  restriction.  3. Grossly unchanged appearance of the fistula from aortic root/LVOT pseudoaneurysm to pulmonary artery, with continued evidence of  left-to-right shunting.  4. Moderate dilation of the main pulmonary artery, 37 mm unchanged from prior.  Electronically Signed: By: Soyla Merck M.D. 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 This a 6 month imaging follow up exam.  COMPARISON:  CT angiography heart 02/25/2022, TEE 03/03/2022, echocardiogram 02/10/2022, 12/24/2020, 06/24/2020, intraoperative TEE 05/15/2020  EXAM: Cardiac ECG gated CT angiogram  TECHNIQUE: The patient was scanned on a Siemens Force 192 slice scanner. A 120 kV retrospective scan was triggered in the descending thoracic aorta at 111 HU's. Gantry rotation speed was 270 msecs and collimation was .9 mm. The 3D data set was reconstructed in 5% intervals of the R-R cycle. Systolic and diastolic phases were analyzed on a dedicated work station using MPR, MIP and VRT modes. The patient received 95mL OMNIPAQUE  IOHEXOL  350 MG/ML SOLN.  FINDINGS: Aortic valve:  23 mm human allograft valve conduit is in place in the aortic position and aortic root. 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 The main body of the pseudoaneurysm measures approximately 35 x 26 x 10 mm. Accounting for differences in measurement technique the size, shape, and location are grossly unchanged, with minimally enlarged dimensions compared to the prior exam from 02/25/2022. This measurement does not include the fistulous connection from the pseudoaneurysm to the pulmonary  artery at the level of the pulmonary valve with flow predominantly in systole from the aortic root/LVOT pseudoaneurysm into the pulmonary artery (left-to-right shunt). See screen captures in PACS for visualization of pseudoaneurysm and fistula to the PA. Prosthetic aortic valve leaflet motion is normal with no significant leaflet thickening and no restriction.  AORTA:  The aortic root conduit is grossly normal in size and appearance above the level of the valve. Stable in appearance compared to prior as well. Above the level of the aortic root repair at the mid ascending aorta, aorta is normal caliber.  PULMONARY ARTERY  Moderate dilated main pulmonary artery, 38 mm. 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  CORONARY ARTERIES:  Normal coronary artery origins. Right dominant circulation. Study was not protocoled to evaluate coronary arteries and nitroglycerin  was not given, coronary arteries were not fully assessed on this exam, and are better evaluated on study from February 25, 2022. Left main and 3 vessel coronary artery calcifications are seen.  PULMONARY VEINS  Normal pulmonary vein anatomy, no anomalous pulmonary venous drainage.  ATRIA  Mild-moderately dilated left atrial chamber size. Normal right atrial chamber size  OTHER: No other significant findings.  Non-cardiac: See separate report from [facility].  IMPRESSION: 1. 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  2. 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  3. Moderate dilation of the main pulmonary artery, 38 mm, increased in size since the prior exam, which measured  approximately 32 mm on side-by-side measurement comparison.  4.  Normal function of the aortic valve prosthetic leaflets.   Electronically Signed By: Soyla Merck M.D. On: 09/07/2022 10:39  Narrative EXAM: OVER-READ INTERPRETATION  CT CHEST  The following report is a limited chest CT over-read performed by radiologist Dr. Jackquline Boxer of [facility] on  09/06/2022. This over-read does not include interpretation of cardiac or coronary anatomy or pathology. The CTA interpretation by the cardiologist is attached.  COMPARISON:  02/25/2022  FINDINGS: Limited view of the lung parenchyma demonstrates no suspicious nodularity. Airways are normal.  Limited view of the mediastinum demonstrates no adenopathy. Esophagus normal.  Limited view of the upper abdomen unremarkable.  Limited view of the skeleton and chest wall is unremarkable.  IMPRESSION: No significant extracardiac findings.  Electronically Signed: By: Jackquline Boxer M.D. On: 09/06/2022 15:16   CT SCANS  CT CORONARY FRACTIONAL FLOW RESERVE DATA PREP 02/25/2022  Narrative EXAM: CT FFR analysis was performed on the original cardiac CTA dataset. Diagrammatic representation of the CT FFR analysis is provided in a separate PDF document in PACS. This dictation was created using the PDF document and an interactive 3D model of the results. The 3D model is not available in the EMR/PACS.  INTERPRETATION: CT FFR provides simultaneous calculation of pressure and flow across the entire coronary tree. For clinical decision making, CT FFR values should be obtained 1-2 cm distal to the lower border of each stenosis measured. Coronary CTA-related artifacts may impair the diagnostic accuracy of the original cardiac CTA and FFR CT results. *Due to the fact that CT FFR represents a mathematically-derived analysis, it is recommended that the results be interpreted as follows:  1. CT FFR >0.80: Low  likelihood of hemodynamic significance. 2. CT FFR 0.76-0.80: Borderline likelihood of hemodynamic significance. 3. CT FFR =< 0.75: High likelihood of hemodynamic significance.  *Coronary CT Angiography-derived Fractional Flow Reserve Testing in Patients with Stable Coronary Artery Disease: Recommendations on Interpretation and Reporting. Radiology: Cardiothoracic Imaging. 2019;1(5):e190050  FINDINGS: 1. Left Main: 0.99; low likelihood of hemodynamic significance. 2. Prox LAD: 0.98; low likelihood of hemodynamic significance. 3. Mid LAD: 0.95; low likelihood of hemodynamic significance. 4. Distal LAD: 0.83; low likelihood of hemodynamic significance. 5. D2: 0.84; low likelihood of hemodynamic significance. 6. LCX: 0.99; low likelihood of hemodynamic significance. 7. Prox RCA: 0.96; low likelihood of hemodynamic significance. 8. Mid RCA: 0.94; low likelihood of hemodynamic significance. 9. Distal RCA: 0.94; low likelihood of hemodynamic significance.  IMPRESSION:  1.  CT FFR negative.  Darryle Decent, MD   Electronically Signed By: Darryle Decent M.D. On: 02/25/2022 21:57     ______________________________________________________________________________________________      EKG:   EKG Interpretation Date/Time:  Wednesday May 09 2024 11:14:28 EDT Ventricular Rate:  56 PR Interval:  200 QRS Duration:  90 QT Interval:  464 QTC Calculation: 447 R Axis:   2  Text Interpretation: Sinus bradycardia Nonspecific T wave abnormality When compared with ECG of 11-Apr-2023 08:51, No significant change was found Confirmed by Wonda Sharper [phone]) on 05/09/2024 11:26:59 AM    Recent Labs: 04/11/2024: TSH 11.24 04/27/2024: ALT 13; BUN 23; Creatinine 1.33; Hemoglobin 10.0; Platelet Count 155; Potassium 4.6; Sodium 144  Recent Lipid Panel    Component Value Date/Time   CHOL 144 04/11/2024 0826   TRIG 103.0 04/11/2024 0826   HDL 56.80 04/11/2024 0826   CHOLHDL 3 04/11/2024 0826    VLDL 20.6 04/11/2024 0826   LDLCALC 67 04/11/2024 0826   LDLCALC 82 07/29/2020 0831   LDLDIRECT 105.0 04/28/2016 0756     Risk Assessment/Calculations:                Physical Exam:    VS:  BP 110/80   Pulse (!) 56   Ht 5' 7 (1.702 m)   Wt 207 lb 6.4 oz (94.1  kg)   SpO2 97%   BMI 32.48 kg/m     Wt Readings from Last 3 Encounters:  05/09/24 207 lb 6.4 oz (94.1 kg)  04/27/24 210 lb 4.8 oz (95.4 kg)  04/11/24 204 lb (92.5 kg)     GEN:  Well nourished, well developed in no acute distress HEENT: Normal NECK: No JVD; No carotid bruits LYMPHATICS: No lymphadenopathy CARDIAC: RRR, 3/6 holosystolic murmur heard throughout RESPIRATORY:  Clear to auscultation without rales, wheezing or rhonchi  ABDOMEN: Soft, non-tender, non-distended MUSCULOSKELETAL:   edema; No deformity  SKIN: Warm and dry NEUROLOGIC:  Alert and oriented x 3 PSYCHIATRIC:  Normal affect   Assessment & Plan Essential hypertension Blood pressure controlled.  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  He has been seen in formal cardiac surgical consult and is not a candidate for redo sternotomy and corrective surgery.  I discussed with him referral to a tertiary center for complex cardiac surgery, but we both agree that this would be an extremely challenging surgery for him to have a good functional recovery.  He favors a conservative approach and understands that his symptoms will likely progress with increased degree of heart failure and functional limitation related to this. Chronic heart failure with preserved ejection fraction [facility]) Patient with restrictive filling and clinical signs of worsening heart failure.  Will increase torsemide  to 40 mg daily and continue him on spironolactone  25 mg daily.  He will return for labs in 3 to 4  weeks to check electrolytes and renal function.  Will arrange APP follow-up in 6 months.  I will see him back in 1 year.  Would repeat an echocardiogram in 1 year.  With no surgical intervention planned, I do not see any reason to do closer echo surveillance.      Medication Adjustments/Labs and Tests Ordered: Current medicines are reviewed at length with the patient today.  Concerns regarding medicines are outlined above.  Orders Placed This Encounter  Procedures   Basic metabolic panel with GFR   EKG [id number]   Meds ordered this encounter  Medications   torsemide  40 MG TABS    Sig: Take 40 mg by mouth daily.    Dispense:  90 tablet    Refill:  3    Increasing to 40 mg   lisinopril  (ZESTRIL ) 5 MG tablet    Sig: Take 1 tablet (5 mg total) by mouth daily.    Dispense:  30 tablet    Refill:  3    Patient Instructions  Medication Instructions:  INCREASE Torsemide  to 40 mg once daily  Refill for Lisinopril  5 mg once daily has been sent to [facility].  *If you need a refill on your cardiac medications before your next appointment, please call your pharmacy*  Lab Work: To be completed in 3-4 weeks: BMP (approximately 10/8-10/15)  If you have labs (blood work) drawn today and your tests are completely normal, you will receive your results only by: MyChart Message (if you have MyChart) OR A paper copy in the mail If you have any lab test that is abnormal or we need to change your treatment, we will call you to review the results.  Testing/Procedures: None ordered today.  Follow-Up: At [facility], you and your health needs are our priority.  As part of our continuing mission to provide you with exceptional heart care,  our providers are all part of one team.  This team includes your primary Cardiologist (physician) and Advanced Practice Providers or APPs (Physician Assistants and Nurse Practitioners) who all work together to provide you with the care you need, when you  need it.  Your next appointment:   3 month(s)  Provider:   One of our Advanced Practice Providers (APPs): Morse Clause, PA-C  Lamarr Satterfield, NP Miriam Shams, NP  Olivia Pavy, PA-C Josefa Beauvais, NP  Leontine Salen, PA-C Orren Fabry, PA-C  [place], PA-C Ernest Dick, NP  Damien Braver, NP Jon Hails, PA-C  Waddell Donath, PA-C    Dayna Dunn, PA-C  Scott Weaver, PA-C Lum Louis, NP Katlyn West, NP Callie Goodrich, PA-C  Xika Zhao, NP Thom Sluder, PA-C    Kathleen Johnson, PA-C     Signed, Ozell Fell, MD  05/09/2024 1:35 PM    [facility]

## 2024-05-09 NOTE — Assessment & Plan Note (Signed)
 Blood pressure controlled.  Treated with lisinopril , spironolactone , and torsemide 

## 2024-05-09 NOTE — Assessment & Plan Note (Signed)
 Patient with restrictive filling and clinical signs of worsening heart failure.  Will increase torsemide  to 40 mg daily and continue him on spironolactone  25 mg daily.  He will return for labs in 3 to 4 weeks to check electrolytes and renal function.  Will arrange APP follow-up in 6 months.  I will see him back in 1 year.  Would repeat an echocardiogram in 1 year.  With no surgical intervention planned, I do not see any reason to do closer echo surveillance.

## 2024-05-10 DIAGNOSIS — D23111 Other benign neoplasm of skin of right upper eyelid, including canthus: Secondary | ICD-10-CM | POA: Diagnosis not present

## 2024-05-10 DIAGNOSIS — H524 Presbyopia: Secondary | ICD-10-CM | POA: Diagnosis not present

## 2024-05-10 DIAGNOSIS — H40013 Open angle with borderline findings, low risk, bilateral: Secondary | ICD-10-CM | POA: Diagnosis not present

## 2024-05-10 DIAGNOSIS — H25813 Combined forms of age-related cataract, bilateral: Secondary | ICD-10-CM | POA: Diagnosis not present

## 2024-05-10 DIAGNOSIS — E119 Type 2 diabetes mellitus without complications: Secondary | ICD-10-CM | POA: Diagnosis not present

## 2024-05-14 ENCOUNTER — Other Ambulatory Visit: Payer: Self-pay | Admitting: Adult Health

## 2024-05-14 ENCOUNTER — Other Ambulatory Visit: Payer: Self-pay | Admitting: Cardiovascular Disease

## 2024-05-14 DIAGNOSIS — E782 Mixed hyperlipidemia: Secondary | ICD-10-CM

## 2024-05-15 ENCOUNTER — Other Ambulatory Visit (INDEPENDENT_AMBULATORY_CARE_PROVIDER_SITE_OTHER)

## 2024-05-15 DIAGNOSIS — E039 Hypothyroidism, unspecified: Secondary | ICD-10-CM | POA: Diagnosis not present

## 2024-05-15 LAB — TSH: TSH: 3.48 u[IU]/mL (ref 0.35–5.50)

## 2024-05-15 NOTE — Telephone Encounter (Signed)
 Pt of yours requesting Omeprazole . Is this something that you want to refill? Please advise.

## 2024-05-15 NOTE — Telephone Encounter (Signed)
 Name from pharmacy: LISINOPRIL  2.5 MG Oral Tablet        Will file in chart as: lisinopril  (ZESTRIL ) 2.5 MG tablet   The original prescription was discontinued on 11/09/2023 by Tamea Edsel SAUNDERS. Renewing this prescription may not be appropriate.    Is pt suppose to still be on this medication? Please advise

## 2024-05-16 ENCOUNTER — Ambulatory Visit: Payer: Self-pay | Admitting: Adult Health

## 2024-05-18 ENCOUNTER — Other Ambulatory Visit (HOSPITAL_COMMUNITY): Payer: Self-pay

## 2024-05-18 ENCOUNTER — Telehealth: Payer: Self-pay | Admitting: Pharmacy Technician

## 2024-05-18 ENCOUNTER — Other Ambulatory Visit: Payer: Self-pay

## 2024-05-18 ENCOUNTER — Encounter (INDEPENDENT_AMBULATORY_CARE_PROVIDER_SITE_OTHER): Payer: Self-pay

## 2024-05-18 MED ORDER — TORSEMIDE 20 MG PO TABS: 40.0000 mg | ORAL_TABLET | Freq: Every day | ORAL | 3 refills | Status: DC

## 2024-05-18 NOTE — Telephone Encounter (Signed)
 Pharmacy Patient Advocate Encounter   Received notification from Fax that prior authorization for Soaanz  40 mg which is brand torsemide  is required/requested.   Insurance verification completed.   The patient is insured through [employer] .   Per test claim: torsemide  20 mg - 2 tablets daily is preferred by insurance at $15.00 copay for 3 months

## 2024-05-18 NOTE — Progress Notes (Signed)
 Specialty Pharmacy Refill Coordination Note  Marian Grandt. is a 78 y.o. male contacted today regarding refills of specialty medication(s) Abiraterone  Acetate (ZYTIGA )   Patient requested (Patient-Rptd) Delivery   Delivery date: 05/22/24   Verified address: (Patient-Rptd) [address] [place] [id number]   Medication will be filled on 05/21/24.

## 2024-05-18 NOTE — Addendum Note (Signed)
 Addended by: GORDON RONAL SQUIBB on: 05/18/2024 03:37 PM   Modules accepted: Orders

## 2024-05-22 DIAGNOSIS — E118 Type 2 diabetes mellitus with unspecified complications: Secondary | ICD-10-CM | POA: Diagnosis not present

## 2024-05-23 ENCOUNTER — Ambulatory Visit: Payer: Self-pay

## 2024-05-23 NOTE — Telephone Encounter (Signed)
 Patient notified of update  and verbalized understanding.

## 2024-05-23 NOTE — Telephone Encounter (Signed)
 FYI Only or Action Required?: FYI only for provider.  Patient was last seen in primary care on 04/11/2024 by Merna Huxley, NP.  Called Nurse Triage reporting Hypoglycemia.  Symptoms began for 2 months.  Interventions attempted: Other: 6 oz sugar water. Pt hel am Metfrmin and glipizide   Symptoms are: rapidly improving.  Triage Disposition: Call PCP Within 24 Hours  Patient/caregiver understands and will follow disposition?: yes          Copied from CRM #[id number]. Topic: Clinical - Red Word Triage >> May 23, 2024  7:59 AM Mia F wrote: Red Word that prompted transfer to Nurse Triage: Pt is having low sugar levels. Not noticing any symptoms. Says he sweats some but that is all. This mornings reading was 61. Other mornings he has had 55,68,53,& 57. Reason for Disposition  [1] Blood glucose 70 mg/dL (3.9 mmol/L) or below OR symptomatic, now improved with Care Advice AND [2] cause unknown  Answer Assessment - Initial Assessment Questions 1. SYMPTOMS: What symptoms are you concerned about?     Sweating low BS readings: 61 to 97 after 4--6 oz sugar water other am readings: 44,31,46,42 2. ONSET:  When did the symptoms start?     2 months  3. BLOOD GLUCOSE: What is your blood glucose level?      Was 61 this am early then went to 97 4. USUAL RANGE: What is your blood glucose level usually? (e.g., usual fasting morning value, usual evening value)     80-85 5. TYPE 1 or 2:  Do you know what type of diabetes you have?  (e.g., Type 1, Type 2, Gestational; doesn't know)     Type 2 6. INSULIN : Do you take insulin ? What type of insulin (s) do you use? What is the mode of delivery? (syringe, pen; injection or pump) When did you last give yourself an insulin  dose? (i.e., time or hours/minutes ago) How much did you give? (i.e., how many units)     no 7. DIABETES PILLS: Do you take any pills for your diabetes? If Yes, ask: What is the name of the medicine(s) that you take  for high blood sugar?     Yes metformin  and Glipizide  8. OTHER SYMPTOMS: Do you have any symptoms? (e.g., fever, frequent urination, difficulty breathing, vomiting)     no 9. LOW BLOOD GLUCOSE TREATMENT: What have you done so far to treat the low blood glucose level?     4-6 oz sugar water 10. FOOD: When did you last eat or drink?       Supper  11. ALONE: Are you alone right now or is someone with you?        alone  Protocols used: Diabetes - Low Blood Sugar-A-AH

## 2024-06-02 LAB — BASIC METABOLIC PANEL WITH GFR
BUN/Creatinine Ratio: 20 (ref 10–24)
BUN: 28 mg/dL — ABNORMAL HIGH (ref 8–27)
CO2: 26 mmol/L (ref 20–29)
Calcium: 9.4 mg/dL (ref 8.6–10.2)
Chloride: 101 mmol/L (ref 96–106)
Creatinine, Ser: 1.39 mg/dL — ABNORMAL HIGH (ref 0.76–1.27)
Glucose: 93 mg/dL (ref 70–99)
Potassium: 4.4 mmol/L (ref 3.5–5.2)
Sodium: 144 mmol/L (ref 134–144)
eGFR: 53 mL/min/1.73 — ABNORMAL LOW (ref >59)

## 2024-06-04 ENCOUNTER — Ambulatory Visit: Payer: Self-pay | Admitting: Physician Assistant

## 2024-06-06 DIAGNOSIS — E118 Type 2 diabetes mellitus with unspecified complications: Secondary | ICD-10-CM | POA: Diagnosis not present

## 2024-06-13 ENCOUNTER — Other Ambulatory Visit: Payer: Self-pay

## 2024-06-15 ENCOUNTER — Other Ambulatory Visit: Payer: Self-pay

## 2024-06-15 ENCOUNTER — Encounter (HOSPITAL_COMMUNITY): Payer: Self-pay | Admitting: Internal Medicine

## 2024-06-15 ENCOUNTER — Telehealth: Payer: Self-pay

## 2024-06-15 ENCOUNTER — Other Ambulatory Visit (HOSPITAL_COMMUNITY): Payer: Self-pay

## 2024-06-15 ENCOUNTER — Encounter: Payer: Self-pay | Admitting: Hematology

## 2024-06-15 NOTE — Telephone Encounter (Signed)
 Oral Oncology Patient Advocate Encounter   Was successful in securing patient a $4500 grant from [employer] (PAF) to provide copayment coverage for Zytiga .  This will keep the out of pocket expense at $0.     The billing information is as follows and has been shared with [facility].   RxBin: [id number] PCN:  [id number] Member ID: [id number] Group ID: [id number] Dates of Eligibility: 12/18/2023 through 06/15/2025   Charlott Hamilton,  CPhT-Adv  she/her/hers [place]  [facility] Specialty Pharmacy Services Pharmacy Technician Patient Advocate Specialist III [facility] Phone: [phone]  Fax: [phone] [email or web address]

## 2024-06-15 NOTE — Progress Notes (Signed)
 Specialty Pharmacy Refill Coordination Note  Matthew Serrano. is a 78 y.o. male contacted today regarding refills of specialty medication(s) Abiraterone  Acetate (ZYTIGA )   Patient requested Delivery   Delivery date: 06/19/24   Verified address: [address], [place], [address]   Medication will be filled on: 06/18/24

## 2024-06-18 ENCOUNTER — Other Ambulatory Visit: Payer: Self-pay

## 2024-06-22 DIAGNOSIS — E118 Type 2 diabetes mellitus with unspecified complications: Secondary | ICD-10-CM | POA: Diagnosis not present

## 2024-06-27 ENCOUNTER — Ambulatory Visit (INDEPENDENT_AMBULATORY_CARE_PROVIDER_SITE_OTHER)

## 2024-06-27 DIAGNOSIS — Z23 Encounter for immunization: Secondary | ICD-10-CM

## 2024-06-29 DIAGNOSIS — C61 Malignant neoplasm of prostate: Secondary | ICD-10-CM | POA: Diagnosis not present

## 2024-06-29 DIAGNOSIS — I509 Heart failure, unspecified: Secondary | ICD-10-CM | POA: Diagnosis not present

## 2024-06-29 DIAGNOSIS — E876 Hypokalemia: Secondary | ICD-10-CM | POA: Diagnosis not present

## 2024-06-29 DIAGNOSIS — M8580 Other specified disorders of bone density and structure, unspecified site: Secondary | ICD-10-CM | POA: Diagnosis not present

## 2024-06-29 DIAGNOSIS — I130 Hypertensive heart and chronic kidney disease with heart failure and stage 1 through stage 4 chronic kidney disease, or unspecified chronic kidney disease: Secondary | ICD-10-CM | POA: Diagnosis not present

## 2024-06-29 DIAGNOSIS — E669 Obesity, unspecified: Secondary | ICD-10-CM | POA: Diagnosis not present

## 2024-06-29 DIAGNOSIS — Z7982 Long term (current) use of aspirin: Secondary | ICD-10-CM | POA: Diagnosis not present

## 2024-06-29 DIAGNOSIS — E785 Hyperlipidemia, unspecified: Secondary | ICD-10-CM | POA: Diagnosis not present

## 2024-06-29 DIAGNOSIS — N1831 Chronic kidney disease, stage 3a: Secondary | ICD-10-CM | POA: Diagnosis not present

## 2024-06-29 DIAGNOSIS — I272 Pulmonary hypertension, unspecified: Secondary | ICD-10-CM | POA: Diagnosis not present

## 2024-06-29 DIAGNOSIS — K219 Gastro-esophageal reflux disease without esophagitis: Secondary | ICD-10-CM | POA: Diagnosis not present

## 2024-06-29 DIAGNOSIS — D509 Iron deficiency anemia, unspecified: Secondary | ICD-10-CM | POA: Diagnosis not present

## 2024-06-29 DIAGNOSIS — E1122 Type 2 diabetes mellitus with diabetic chronic kidney disease: Secondary | ICD-10-CM | POA: Diagnosis not present

## 2024-06-29 DIAGNOSIS — E018 Other iodine-deficiency related thyroid disorders and allied conditions: Secondary | ICD-10-CM | POA: Diagnosis not present

## 2024-06-29 DIAGNOSIS — Z6832 Body mass index (BMI) 32.0-32.9, adult: Secondary | ICD-10-CM | POA: Diagnosis not present

## 2024-06-29 DIAGNOSIS — Z79890 Hormone replacement therapy (postmenopausal): Secondary | ICD-10-CM | POA: Diagnosis not present

## 2024-06-29 DIAGNOSIS — R001 Bradycardia, unspecified: Secondary | ICD-10-CM | POA: Diagnosis not present

## 2024-07-06 DIAGNOSIS — E118 Type 2 diabetes mellitus with unspecified complications: Secondary | ICD-10-CM | POA: Diagnosis not present

## 2024-07-11 ENCOUNTER — Other Ambulatory Visit: Payer: Self-pay

## 2024-07-11 ENCOUNTER — Other Ambulatory Visit: Payer: Self-pay | Admitting: Hematology

## 2024-07-11 DIAGNOSIS — C61 Malignant neoplasm of prostate: Secondary | ICD-10-CM

## 2024-07-12 ENCOUNTER — Other Ambulatory Visit: Payer: Self-pay

## 2024-07-12 MED ORDER — ABIRATERONE ACETATE 250 MG PO TABS
1000.0000 mg | ORAL_TABLET | Freq: Every day | ORAL | 2 refills | Status: AC
  Filled 2024-07-12 (×2): qty 120, 30d supply, fill #0
  Filled 2024-08-09: qty 120, 30d supply, fill #1
  Filled 2024-09-07: qty 120, 30d supply, fill #2

## 2024-07-16 ENCOUNTER — Other Ambulatory Visit: Payer: Self-pay

## 2024-07-17 ENCOUNTER — Other Ambulatory Visit: Payer: Self-pay

## 2024-07-17 NOTE — Progress Notes (Signed)
 Specialty Pharmacy Refill Coordination Note  Matthew Serrano. is a 78 y.o. male contacted today regarding refills of specialty medication(s) Abiraterone  Acetate (ZYTIGA )   Patient requested Delivery   Delivery date: 07/23/24   Verified address: [address], [place], [address]   Medication will be filled on: 07/20/24

## 2024-07-18 ENCOUNTER — Other Ambulatory Visit: Payer: Self-pay | Admitting: Adult Health

## 2024-07-22 DIAGNOSIS — E118 Type 2 diabetes mellitus with unspecified complications: Secondary | ICD-10-CM | POA: Diagnosis not present

## 2024-08-05 ENCOUNTER — Other Ambulatory Visit: Payer: Self-pay | Admitting: Cardiovascular Disease

## 2024-08-05 DIAGNOSIS — E118 Type 2 diabetes mellitus with unspecified complications: Secondary | ICD-10-CM | POA: Diagnosis not present

## 2024-08-07 NOTE — Assessment & Plan Note (Signed)
 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

## 2024-08-07 NOTE — Assessment & Plan Note (Signed)
 He had nonobstructive CAD prior to his aortic valve surgery.  Nuclear stress test in June 2023 was negative for ischemia.  He is not having anginal symptoms.  Most recent LDL optimal. -Continue ASA 81 mg daily, Lipitor  80 mg daily

## 2024-08-07 NOTE — Progress Notes (Unsigned)
 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 in size of Ao root pseudoaneurysm (4 x 2.1 x 3.6 cm); aorta-pulmonary fistula more prominent TTE 04/17/2024: EF 60-65, no RWMA, GR 3 DD (restrictive), E/e' 18, mildly reduced RVSF, moderately elevated PASP, mild LAE, moderate-severe MR, moderate TR, pseudoaneurysm below the level of the right and left coronary cusp extending into the LVOT, aortopulmonary fistula, mild-moderate AI Heart failure with preserved ejection fraction  Coronary artery disease  Cath 02/13/20: mLAD 50, pLCx 50, mRCA 45 MPI 02/10/22: No ischemia or infarction, EF 69, low risk  Pulmonary hypertension Diabetes mellitus  Chronic kidney disease  Hypertension  Hyperlipidemia Hypothyroidism  Erectile dysfunction Metastatic prostate CA on suppression chemotherapy  Hx of LGI bleed 01/2020 Colo w cecal telangiectasias >> treated w APC S/p PRBC transfusions         Discussed the use of AI scribe software for clinical note transcription with the patient, who gave verbal consent to proceed. History of Present Illness Matthew Serrano. is a 78 y.o. male for follow up of AV disease and CHF.  He has a history of severe aortic  stenosis and was to undergo planned AVR.  However, he developed endocarditis with severe AI and underwent AVR, aortic root replacement and repair of aorto to pulmonary artery fistula.  He was admitted in June 2023 with evidence of endocarditis and aortic root abscess.  He was noted to have pseudoaneurysm and aortopulmonary fistula.  Interestingly, he did not have subjective symptoms.  He was seen by infectious disease and CT surgery.  He was treated with several weeks of IV antibiotics.  It was felt that he was not a good candidate for redo surgery with high likelihood of catastrophic outcome.  The patient declined referral to a tertiary center.  A follow-up CT in August 2025 demonstrated slight increase in size of aortic root pseudoaneurysm.  The size of the fistulous communication along the roof of the pseudoaneurysm to the pulmonary outflow tract was noted to be more prominent.  Follow-up echocardiogram demonstrated normal EF, grade 3 diastolic dysfunction moderately elevated PASP, moderate to severe mitral regurgitation, mild to moderate AI and continued pseudoaneurysm and aortopulmonary fistula.  He was last seen by Dr. Fell in 04/2024. He was volume overloaded. His Torsemide  dose was increased to 40 mg once daily and follow up was arranged today.   He experiences swelling in both ankles, which has not worsened. His breathing is 'pretty good' and he can walk from the parking lot to the front of a store without stopping. He can also climb stairs at home without difficulty. No chest symptoms, passing out, or shortness of breath waking him from sleep. He does prop his  head up while sleeping but does not feel it is due to breathing difficulty. He occasionally feels his heart racing when climbing stairs. He reports occasional darker stools but no blood. He denies bladder infections.    ROS-See HPI    Studies Reviewed:       LABS 04/11/2024: Total cholesterol 144, HDL 56.8, triglycerides 103, LDL  67 04/27/2024: K 4.6, creatinine 1.33, eGFR 55, ALT 13, Hgb 10, PLT 155K 05/15/2024: TSH 3.48 06/01/2024: K 4.4, creatinine 1.39, eGFR 53         Physical Exam:  VS:  BP 130/70   Pulse (!) 57   Ht 5' 7 (1.702 m)   Wt 206 lb (93.4 kg)   SpO2 98%   BMI 32.26 kg/m        Wt Readings from Last 3 Encounters:  08/08/24 206 lb (93.4 kg)  05/09/24 207 lb 6.4 oz (94.1 kg)  04/27/24 210 lb 4.8 oz (95.4 kg)    Constitutional:      Appearance: Healthy appearance. Not in distress.  Neck:     Vascular: No JVR. JVD normal.  Pulmonary:     Breath sounds: Normal breath sounds. No wheezing. No rales.  Cardiovascular:     Normal rate. Regular rhythm.     Murmurs: There is a grade 3/6 holosystolic murmur at the URSB.  Edema:    Peripheral edema present.    Ankle: bilateral 2+ edema of the ankle. Abdominal:     Palpations: Abdomen is soft.       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 He has been seen in formal cardiac surgical consult and is not a candidate for redo sternotomy and corrective surgery.  His most recent CT and echocardiogram suggest increased size of aortic root aneurysm and more prominent aorta pulmonary artery fistula.  When last seen in September, he had worsening heart failure symptoms.  Dr. Wonda discussed with him at last visit that his symptoms will likely progress with increased degrees of heart failure and functional limitation.  Chronic heart failure with preserved ejection fraction (HCC) EF 60-65 by echocardiogram August 2025.  He has evidence of grade 3 diastolic dysfunction.  NYHA II-IIb.  Volume status overall seems improved.  He still has lower extremity edema which is unchanged.  Weight by our scale is unchanged.  However, his breathing has improved.  Lungs are clear on exam and neck veins are flat.  He  does not have a history of incontinence or frequent UTIs.  I think he would be a good candidate for SGLT2 inhibitor.  However I would like to discuss this with Dr. Wonda first before initiating.  If we feel that SGLT2 inhibitor is not a good choice, I will adjust his torsemide  slightly to continue to improve his volume status. -Continue torsemide  40 mg daily -Continue spironolactone  25 mg every other day -Continue K 20 mEq daily -Consider Jardiance versus Farxiga.  As noted I will discuss with Dr. Wonda. -If we opt to not start SGLT2 inhibitor, I will adjust his torsemide  to 60 mg twice weekly and 40 mg all other days -Follow-up with me in 3 to 4 months -Follow-up with Dr. Wonda in September 2026 Primary hypertension Blood pressure controlled. -Continue labetalol  600 mg in the morning and 400 mg in the evening, lisinopril  5 mg daily, spironolactone  25 mg every other day Coronary  artery disease involving native coronary artery of native heart without angina pectoris He had nonobstructive CAD prior to his aortic valve surgery.  Nuclear stress test in June 2023 was negative for ischemia.  He is not having anginal symptoms.  Most recent LDL optimal. -Continue ASA 81 mg daily, Lipitor  80 mg daily CKD stage 3b, GFR 30-44 ml/min (HCC) Recent creatinine stable. Other secondary pulmonary hypertension (HCC) Likely group 2 related to L heart disease.       Dispo:  Return in about 3 months (around 11/06/2024) for Routine Follow Up, w/ Glendia Ferrier, PA-C.  Signed, Glendia Ferrier, PA-C

## 2024-08-08 ENCOUNTER — Encounter: Payer: Self-pay | Admitting: Physician Assistant

## 2024-08-08 ENCOUNTER — Ambulatory Visit: Attending: Cardiovascular Disease | Admitting: Physician Assistant

## 2024-08-08 VITALS — BP 130/70 | HR 57 | Ht 67.0 in | Wt 206.0 lb

## 2024-08-08 DIAGNOSIS — I5032 Chronic diastolic (congestive) heart failure: Secondary | ICD-10-CM

## 2024-08-08 DIAGNOSIS — I2729 Other secondary pulmonary hypertension: Secondary | ICD-10-CM

## 2024-08-08 DIAGNOSIS — I10 Essential (primary) hypertension: Secondary | ICD-10-CM | POA: Diagnosis not present

## 2024-08-08 DIAGNOSIS — Q2543 Congenital aneurysm of aorta: Secondary | ICD-10-CM | POA: Diagnosis not present

## 2024-08-08 DIAGNOSIS — N1832 Chronic kidney disease, stage 3b: Secondary | ICD-10-CM

## 2024-08-08 DIAGNOSIS — E1169 Type 2 diabetes mellitus with other specified complication: Secondary | ICD-10-CM

## 2024-08-08 DIAGNOSIS — I2510 Atherosclerotic heart disease of native coronary artery without angina pectoris: Secondary | ICD-10-CM

## 2024-08-08 MED ORDER — EMPAGLIFLOZIN 10 MG PO TABS: 10.0000 mg | ORAL_TABLET | Freq: Every day | ORAL | 11 refills | Status: DC

## 2024-08-08 NOTE — Patient Instructions (Signed)
 Medication Instructions:  Your physician recommends that you continue on your current medications as directed. Please refer to the Current Medication list given to you today. *If you need a refill on your cardiac medications before your next appointment, please call your pharmacy*  Lab Work: None ordered If you have labs (blood work) drawn today and your tests are completely normal, you will receive your results only by: MyChart Message (if you have MyChart) OR A paper copy in the mail If you have any lab test that is abnormal or we need to change your treatment, we will call you to review the results.  Testing/Procedures: None ordered  Follow-Up: At [facility], you and your health needs are our priority.  As part of our continuing mission to provide you with exceptional heart care, our providers are all part of one team.  This team includes your primary Cardiologist (physician) and Advanced Practice Providers or APPs (Physician Assistants and Nurse Practitioners) who all work together to provide you with the care you need, when you need it.  Your next appointment:   3-4 month(s)  Provider:   Glendia Ferrier, PA-C      Then, Ozell Fell, MD will plan to see you again in 6 month(s).    We recommend signing up for the patient portal called MyChart.  Sign up information is provided on this After Visit Summary.  MyChart is used to connect with patients for Virtual Visits (Telemedicine).  Patients are able to view lab/test results, encounter notes, upcoming appointments, etc.  Non-urgent messages can be sent to your provider as well.   To learn more about what you can do with MyChart, go to [email or web address].   Other Instructions

## 2024-08-09 ENCOUNTER — Telehealth: Payer: Self-pay | Admitting: Cardiovascular Disease

## 2024-08-09 ENCOUNTER — Other Ambulatory Visit: Payer: Self-pay

## 2024-08-09 NOTE — Telephone Encounter (Signed)
°*  STAT* If patient is at the pharmacy, call can be transferred to refill team.   1. Which medications need to be refilled? (please list name of each medication and dose if known)   empagliflozin (JARDIANCE) 10 MG TABS tablet   2. Would you like to learn more about the convenience, safety, & potential cost savings by using the [facility]?   3. Are you open to using the [facility] (Type [facility]. ).  4. Which pharmacy/location (including street and city if local pharmacy) is medication to be sent to?  [facility] - [place], [place] - [address]   5. Do they need a 30 day or 90 day supply?   90 day  Patient stated he does not have any medication as yet and will need this prescription sent to  [facility] - [place], [place] - [address]  instead of [facility].

## 2024-08-10 ENCOUNTER — Encounter: Payer: Self-pay | Admitting: Hematology

## 2024-08-10 ENCOUNTER — Encounter (HOSPITAL_COMMUNITY): Payer: Self-pay | Admitting: Internal Medicine

## 2024-08-10 ENCOUNTER — Other Ambulatory Visit (HOSPITAL_COMMUNITY): Payer: Self-pay

## 2024-08-10 ENCOUNTER — Other Ambulatory Visit: Payer: Self-pay | Admitting: *Deleted

## 2024-08-10 MED ORDER — EMPAGLIFLOZIN 10 MG PO TABS
10.0000 mg | ORAL_TABLET | Freq: Every day | ORAL | 0 refills | Status: AC
  Filled 2024-08-10 (×2): qty 30, 30d supply, fill #0

## 2024-08-10 MED ORDER — EMPAGLIFLOZIN 10 MG PO TABS: 10.0000 mg | ORAL_TABLET | Freq: Every day | ORAL | 3 refills | Status: AC

## 2024-08-10 NOTE — Telephone Encounter (Signed)
 Pt needs Jardiance sent to [facility].  Pt aware that was [facility] [address]  He will go pick it up.  [facility], pt's copay will be the same as a 30 day supply..  I contacted our pharmacy downstairs, pt can actually use a FREE 30 day card for new start Jardiance, YAY!  Pt is aware to stop Glipizide , Start Jardiance 10 mg, get labs 08/24/24.

## 2024-08-10 NOTE — Telephone Encounter (Signed)
 I sent him a MyChart message the other day to start Jardiance. I have already sent the Rx [facility] [address]  Will you review the MyChart message with him? He needs a repeat BMET in a couple of weeks. Glendia Ferrier, PA-C    08/10/2024 11:27 AM

## 2024-08-10 NOTE — Telephone Encounter (Signed)
 Discussed message with pt.  See phone encounter.

## 2024-08-14 ENCOUNTER — Other Ambulatory Visit: Payer: Self-pay

## 2024-08-14 NOTE — Progress Notes (Signed)
 Specialty Pharmacy Refill Coordination Note  Matthew Kolton. is a 78 y.o. male contacted today regarding refills of specialty medication(s) Abiraterone  Acetate (ZYTIGA )   Patient requested Delivery   Delivery date: 08/20/24   Verified address: [address], [place], [address]   Medication will be filled on: 08/17/24

## 2024-08-17 ENCOUNTER — Other Ambulatory Visit: Payer: Self-pay

## 2024-08-21 ENCOUNTER — Other Ambulatory Visit: Payer: Self-pay

## 2024-08-21 ENCOUNTER — Other Ambulatory Visit (HOSPITAL_COMMUNITY): Payer: Self-pay

## 2024-08-21 NOTE — Progress Notes (Signed)
 Specialty Pharmacy Ongoing Clinical Assessment Note  Matthew Serrano. is a 78 y.o. male who is being followed by the specialty pharmacy service for RxSp Oncology   Patient's specialty medication(s) reviewed today: Abiraterone  Acetate (ZYTIGA )   Missed doses in the last 4 weeks: 0   Patient/Caregiver did not have any additional questions or concerns.   Therapeutic benefit summary: Patient is achieving benefit   Adverse events/side effects summary: No adverse events/side effects   Patient's therapy is appropriate to: Continue    Goals Addressed             This Visit's Progress    Slow Disease Progression   On track    Patient is on track. Patient will maintain adherence. PSA undetectable.          Follow up: 6 months  Silvano LOISE Dolly Specialty Pharmacist  Clinical Intervention Note  Clinical Intervention Notes: The patient reported that his provider switched his medication from glipizide  to Jardiance . He was informed that Zytiga  may increase the risk of severe hypoglycemia when taken with Jardiance . The patient stated that he monitors his blood glucose regularly and has not experienced any hypoglycemic episodes. He was counseled on recognizing the signs and symptoms of hypoglycemia and on appropriate management of hypoglycemic events.   Clinical Intervention Outcomes: Prevention of an adverse drug event   Silvano LOISE Dolly Karel Santa

## 2024-08-23 ENCOUNTER — Other Ambulatory Visit: Payer: Self-pay | Admitting: Cardiovascular Disease

## 2024-08-23 ENCOUNTER — Encounter: Payer: Self-pay | Admitting: Hematology

## 2024-08-23 ENCOUNTER — Encounter (HOSPITAL_COMMUNITY): Payer: Self-pay | Admitting: Internal Medicine

## 2024-08-26 ENCOUNTER — Other Ambulatory Visit: Payer: Self-pay | Admitting: Nurse Practitioner

## 2024-08-26 DIAGNOSIS — C61 Malignant neoplasm of prostate: Secondary | ICD-10-CM

## 2024-08-26 NOTE — Progress Notes (Signed)
 " Patient Care Team: Merna Huxley, NP as PCP - General (Family Medicine) Wonda Sharper, MD as PCP - Cardiology (Cardiology) Cam Morene ORN, MD as Attending Physician (Urology) Lelon Glendia ONEIDA DEVONNA as Physician Assistant (Cardiology) Lanny Callander, MD as Attending Physician (Hematology and Oncology) Lionell Jon DEL, [facility] (Pharmacist)  Clinic Day:  08/27/2024  Referring physician: Merna Huxley, NP  ASSESSMENT & PLAN:   Assessment & Plan: Malignant neoplasm of prostate [facility]) -stage IV with node metastasis, diagnosed in 2019, castration-resistant now  -Initial prostate biopsy obtained on 06/06/2018 which showed high-volume disease with a Gleason score 4+5 = 9. -first line therapy with Taxotere  chemotherapy at 75 mg/m every 3 weeks started on 07/07/2018 and stopped in Feb 2020 after 6 cycles.  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 -currently on Eligard  30 mg every 4 months and Zytiga  1000 mg daily started in April 2020.  His PSA has been undetectable since then  -He is clinically doing very well, asymptomatic, will continue current treatment. -Will obtain a PSMA PET scan if his PSA increases significantly.  Most recent PSA done 04/18/2023 was<0.1. 08/25/2023 -PSA <0.02.  Patient more anemic than normal. Having blood present in stool. Sent message out to GI provider to schedule follow up and possible colonoscopy with him.  - 12/23/2023-most recent PSA <0.02.  Proceed with Eligard  injection of 30 mg and continue every 4 months.  Continue Zytiga  1000 mg daily.  Labs with follow-up in 4 months, sooner if needed. - 04/27/2024 -PSA from 04/19/2024 was 0.03.  He has stable and unremarkable CBC and CMP.  Proceed with Eligard  30 mg injection today.  Continue Zytiga  1000 mg and prednisone  5 mg daily. -Labs with follow-up and Eligard  injection in 4 months. - 08/27/2024 -PSA <0.02.  Mild and stable anemia.  Decreased renal functions.  Monitored by  primary care.  Proceed with Eligard  30 mg injection today.  Continue Zytiga  1000 mg along with prednisone  5 mg daily.  Labs and Eligard  in 4 months.   Anemia Stable anemia with HGB10 21 HCT 32.9.  Possibly related to chronic kidney disease.  His BUN is 29 and creatinine is 1.83.  His eGFR is 38.  This is followed by primary care.  Recently changed glipizide  to Jardiance .  Recommend aggressive oral hydration.  Labs to be rechecked in the near future per primary care.  Type 2 diabetes Primary care recently changed glipizide  to Jardiance  due to known benefits to kidneys and heart.  Blood sugar elevated and renal functions decreased.  Unclear if this is result of medication change.  Patient to repeat labs and follow-up with primary care in near future.  Prostate cancer Patient receiving Eligard  injections 30 mg every 4 months.  Taking Zytiga  1000 mg daily.  Tolerating very well.  PSA <0.02.  Continue treatment as recommended.  Plan Labs reviewed. - Stable anemia. - Decreased renal functions.  Only related to change of glipizide  to Jardiance .  Followed by primary care. -TSH normal at 3.670. - PSA <0.02. Proceed with Eligard  30 mg.  Continue every 4 months. Continue Zytiga  1000 mg daily. Plan for labs and follow-up in 4 months, sooner if needed.  The patient understands the plans discussed today and is in agreement with them.  He knows to contact our office if he develops concerns prior to his next appointment.  I provided 25 minutes of face-to-face time during this encounter and > 50% was spent counseling as documented under my assessment  and plan.    Powell FORBES Lessen, NP  [facility] - A DEPT OF [facility] [address] [place] [address] Dept: [phone] Dept Fax: [phone]   No orders of the defined types were placed in this encounter.     CHIEF COMPLAINT:  CC: Malignant neoplasm of prostate  Current  Treatment: Eligard  30 mg every 4 months and Zytiga  1000 mg daily  INTERVAL HISTORY:  Matthew Serrano is here today for repeat clinical assessment.  He was last seen by me on 04/28/2024.  PSA on that date was <0.1.  Today, PSA <0.02.  Renal function slightly decreased.  He states primary care provider recently changed glipizide  to Jardiance .  Due to have labs rechecked in near future.  States he did have GI virus on 08/17/2024.  This resulted in nausea, vomiting, and diarrhea.  May still be recovering from dehydration, though unlikely.  He denies chest pain, chest pressure, or shortness of breath. He denies headaches or visual disturbances. He denies abdominal pain, nausea, vomiting, or changes in bowel or bladder habits.  He denies fevers or chills. He denies pain. His appetite is good. His weight has decreased 7 pounds over last 4 months.  I have reviewed the past medical history, past surgical history, social history and family history with the patient and they are unchanged from previous note.  ALLERGIES:  has no known allergies.  MEDICATIONS:  Current Outpatient Medications  Medication Sig Dispense Refill   abiraterone  acetate (ZYTIGA ) 250 MG tablet Take 4 tablets (1,000 mg total) by mouth daily. Take on an empty stomach 1 hour before or 2 hours after a meal 120 tablet 2   Alcohol Swabs (DROPSAFE ALCOHOL PREP) 70 % PADS AS DIRECTED 300 each 3   aspirin  EC 81 MG tablet Take 81 mg by mouth daily. Swallow whole.     atorvastatin  (LIPITOR ) 80 MG tablet TAKE 1 TABLET EVERY DAY 90 tablet 3   Blood Glucose Calibration (TRUE METRIX LEVEL 1) Low SOLN USE AS DIRECTED WITH GLUCOSE METER 1 each 3   calcium  carbonate (CALCIUM  600) 1500 (600 Ca) MG TABS tablet Take 0.8 tablets (1,200 mg total) by mouth daily. 30 tablet 0   cholecalciferol (VITAMIN D3) 25 MCG (1000 UT) tablet Take 1,000 Units by mouth daily.     empagliflozin  (JARDIANCE ) 10 MG TABS tablet Take 1 tablet (10 mg total) by mouth daily. 30 tablet 0    empagliflozin  (JARDIANCE ) 10 MG TABS tablet Take 1 tablet (10 mg total) by mouth daily. 270 tablet 3   ferrous sulfate  325 (65 FE) MG EC tablet Take 1 tablet (325 mg total) by mouth 2 (two) times daily before a meal. 60 tablet 2   labetalol  (NORMODYNE ) 200 MG tablet TAKE 3 TABLETS EVERY MORNING AND TAKE 2 TABLETS EVERY EVENING 450 tablet 3   levothyroxine  (SYNTHROID ) 125 MCG tablet TAKE 1 TABLET EVERY DAY 90 tablet 3   lisinopril  (ZESTRIL ) 5 MG tablet Take 1 tablet (5 mg total) by mouth daily. 90 tablet 2   metFORMIN  (GLUCOPHAGE ) 1000 MG tablet TAKE 1 TABLET TWICE DAILY WITH MEALS 180 tablet 3   omeprazole  (PRILOSEC) 40 MG capsule TAKE 1 CAPSULE EVERY DAY 90 capsule 3   potassium chloride  SA (KLOR-CON  M) 20 MEQ tablet Take 1 tablet (20 mEq total) by mouth daily. 90 tablet 3   predniSONE  (DELTASONE ) 5 MG tablet Take 1 tablet (5 mg total) by mouth daily with breakfast.  90 tablet 3   spironolactone  (ALDACTONE ) 25 MG tablet TAKE 1 TABLET EVERY OTHER DAY 45 tablet 3   torsemide  (DEMADEX ) 20 MG tablet TAKE 2 TABLETS (40 MG TOTAL) BY MOUTH DAILY. 180 tablet 3   No current facility-administered medications for this visit.    HISTORY OF PRESENT ILLNESS:   Oncology History  Malignant neoplasm of prostate (HCC)  06/23/2018 Initial Diagnosis   Malignant neoplasm of prostate (HCC)   07/07/2018 - 10/20/2018 Chemotherapy   The patient had pegfilgrastim  (NEULASTA  ONPRO KIT) injection 6 mg, 6 mg, Subcutaneous, Once, 5 of 5 cycles Administration: 6 mg (07/28/2018), 6 mg (08/18/2018), 6 mg (09/08/2018), 6 mg (09/29/2018), 6 mg (10/20/2018) pegfilgrastim -cbqv (UDENYCA ) injection 6 mg, 6 mg, Subcutaneous, Once, 1 of 1 cycle Administration: 6 mg (07/08/2018) DOCEtaxel  (TAXOTERE ) 170 mg in sodium chloride  0.9 % 250 mL chemo infusion, 75 mg/m2 = 170 mg, Intravenous,  Once, 6 of 6 cycles Administration: 170 mg (07/07/2018), 170 mg (07/28/2018), 170 mg (08/18/2018), 170 mg (09/08/2018), 170 mg (09/29/2018), 170 mg  (10/20/2018)  for chemotherapy treatment.        REVIEW OF SYSTEMS:   Constitutional: Denies fevers, chills or abnormal weight loss Eyes: Denies blurriness of vision Ears, nose, mouth, throat, and face: Denies mucositis or sore throat Respiratory: Denies cough, dyspnea or wheezes Cardiovascular: Denies palpitation, chest discomfort or lower extremity swelling Gastrointestinal:  Denies nausea, heartburn or change in bowel habits Skin: Denies abnormal skin rashes Lymphatics: Denies new lymphadenopathy or easy bruising Neurological:Denies numbness, tingling or new weaknesses Behavioral/Psych: Mood is stable, no new changes  All other systems were reviewed with the patient and are negative.   VITALS:   Today's Vitals   08/27/24 0908 08/27/24 0916  BP: 124/60   Pulse: 66   Resp: 17   Temp: (!) 97 F (36.1 C)   SpO2: 99%   Weight: 200 lb 1.6 oz (90.8 kg)   PainSc:  0-No pain   Body mass index is 31.34 kg/m.   Wt Readings from Last 3 Encounters:  08/27/24 200 lb 1.6 oz (90.8 kg)  08/08/24 206 lb (93.4 kg)  05/09/24 207 lb 6.4 oz (94.1 kg)    Body mass index is 31.34 kg/m.  Performance status (ECOG): 1 - Symptomatic but completely ambulatory  PHYSICAL EXAM:   GENERAL:alert, no distress and comfortable SKIN: skin color, texture, turgor are normal, no rashes or significant lesions EYES: normal, Conjunctiva are pink and non-injected, sclera clear OROPHARYNX:no exudate, no erythema and lips, buccal mucosa, and tongue normal  NECK: supple, thyroid  normal size, non-tender, without nodularity LYMPH:  no palpable lymphadenopathy in the cervical, axillary or inguinal LUNGS: clear to auscultation and percussion with normal breathing effort HEART: regular rate & rhythm and no lower extremity edema.  He does have a loud systolic murmur. ABDOMEN:abdomen soft, non-tender and normal bowel sounds Musculoskeletal:no cyanosis of digits and no clubbing  NEURO: alert & oriented x 3 with  fluent speech, no focal motor/sensory deficits  LABORATORY DATA:  I have reviewed the data as listed    Component Value Date/Time   NA 144 08/28/2024 0913   K 3.8 08/28/2024 0913   CL 101 08/28/2024 0913   CO2 26 08/28/2024 0913   GLUCOSE 162 (H) 08/28/2024 0913   GLUCOSE 227 (H) 08/27/2024 0842   BUN 25 08/28/2024 0913   CREATININE 1.58 (H) 08/28/2024 0913   CREATININE 1.83 (H) 08/27/2024 0842   CREATININE 1.08 05/17/2022 0918   CALCIUM  9.5 08/28/2024 0913   PROT 6.2 (L)  08/27/2024 0842   PROT 6.2 06/30/2020 1516   ALBUMIN  3.9 08/27/2024 0842   ALBUMIN  3.7 06/30/2020 1516   AST 37 08/27/2024 0842   ALT 22 08/27/2024 0842   ALKPHOS 59 08/27/2024 0842   BILITOT 0.6 08/27/2024 0842   GFRNONAA 38 (L) 08/27/2024 0842   GFRAA 99 07/03/2020 1016   GFRAA >60 04/29/2020 0948    Lab Results  Component Value Date   WBC 7.2 08/27/2024   NEUTROABS 5.2 08/27/2024   HGB 10.8 (L) 08/27/2024   HCT 32.9 (L) 08/27/2024   MCV 85.7 08/27/2024   PLT 209 08/27/2024     "

## 2024-08-26 NOTE — Assessment & Plan Note (Addendum)
-  stage IV with node metastasis, diagnosed in 2019, castration-resistant now  -Initial prostate biopsy obtained on 06/06/2018 which showed high-volume disease with a Gleason score 4+5 = 9. -first line therapy with Taxotere  chemotherapy at 75 mg/m every 3 weeks started on 07/07/2018 and stopped in Feb 2020 after 6 cycles.  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 -currently on Eligard  30 mg every 4 months and Zytiga  1000 mg daily started in April 2020.  His PSA has been undetectable since then  -He is clinically doing very well, asymptomatic, will continue current treatment. -Will obtain a PSMA PET scan if his PSA increases significantly.  Most recent PSA done 04/18/2023 was<0.1. 08/25/2023 -PSA <0.02.  Patient more anemic than normal. Having blood present in stool. Sent message out to GI provider to schedule follow up and possible colonoscopy with him.  - 12/23/2023-most recent PSA <0.02.  Proceed with Eligard  injection of 30 mg and continue every 4 months.  Continue Zytiga  1000 mg daily.  Labs with follow-up in 4 months, sooner if needed. - 04/27/2024 -PSA from 04/19/2024 was 0.03.  He has stable and unremarkable CBC and CMP.  Proceed with Eligard  30 mg injection today.  Continue Zytiga  1000 mg and prednisone  5 mg daily. -Labs with follow-up and Eligard  injection in 4 months. - 08/27/2024 -PSA <0.02.  Mild and stable anemia.  Decreased renal functions.  Monitored by primary care.  Proceed with Eligard  30 mg injection today.  Continue Zytiga  1000 mg along with prednisone  5 mg daily.  Labs and Eligard  in 4 months.

## 2024-08-27 ENCOUNTER — Encounter (HOSPITAL_COMMUNITY): Payer: Self-pay | Admitting: Internal Medicine

## 2024-08-27 ENCOUNTER — Inpatient Hospital Stay: Attending: Nurse Practitioner

## 2024-08-27 ENCOUNTER — Encounter: Payer: Self-pay | Admitting: Hematology

## 2024-08-27 ENCOUNTER — Inpatient Hospital Stay

## 2024-08-27 ENCOUNTER — Inpatient Hospital Stay: Admitting: Nurse Practitioner

## 2024-08-27 VITALS — BP 124/60 | HR 66 | Temp 97.0°F | Resp 17 | Wt 200.1 lb

## 2024-08-27 DIAGNOSIS — Z7984 Long term (current) use of oral hypoglycemic drugs: Secondary | ICD-10-CM | POA: Insufficient documentation

## 2024-08-27 DIAGNOSIS — C61 Malignant neoplasm of prostate: Secondary | ICD-10-CM

## 2024-08-27 DIAGNOSIS — Z79899 Other long term (current) drug therapy: Secondary | ICD-10-CM | POA: Diagnosis not present

## 2024-08-27 DIAGNOSIS — D649 Anemia, unspecified: Secondary | ICD-10-CM | POA: Diagnosis not present

## 2024-08-27 DIAGNOSIS — E1165 Type 2 diabetes mellitus with hyperglycemia: Secondary | ICD-10-CM | POA: Insufficient documentation

## 2024-08-27 LAB — CBC WITH DIFFERENTIAL (CANCER CENTER ONLY)
Abs Immature Granulocytes: 0.05 K/uL (ref 0.00–0.07)
Basophils Absolute: 0 K/uL (ref 0.0–0.1)
Basophils Relative: 1 %
Eosinophils Absolute: 0.1 K/uL (ref 0.0–0.5)
Eosinophils Relative: 1 %
HCT: 32.9 % — ABNORMAL LOW (ref 39.0–52.0)
Hemoglobin: 10.8 g/dL — ABNORMAL LOW (ref 13.0–17.0)
Immature Granulocytes: 1 %
Lymphocytes Relative: 20 %
Lymphs Abs: 1.4 K/uL (ref 0.7–4.0)
MCH: 28.1 pg (ref 26.0–34.0)
MCHC: 32.8 g/dL (ref 30.0–36.0)
MCV: 85.7 fL (ref 80.0–100.0)
Monocytes Absolute: 0.4 K/uL (ref 0.1–1.0)
Monocytes Relative: 6 %
Neutro Abs: 5.2 K/uL (ref 1.7–7.7)
Neutrophils Relative %: 71 %
Platelet Count: 209 K/uL (ref 150–400)
RBC: 3.84 MIL/uL — ABNORMAL LOW (ref 4.22–5.81)
RDW: 14.6 % (ref 11.5–15.5)
WBC Count: 7.2 K/uL (ref 4.0–10.5)
nRBC: 0 % (ref 0.0–0.2)

## 2024-08-27 LAB — CMP (CANCER CENTER ONLY)
ALT: 22 U/L (ref 0–44)
AST: 37 U/L (ref 15–41)
Albumin: 3.9 g/dL (ref 3.5–5.0)
Alkaline Phosphatase: 59 U/L (ref 38–126)
Anion gap: 12 (ref 5–15)
BUN: 29 mg/dL — ABNORMAL HIGH (ref 8–23)
CO2: 27 mmol/L (ref 22–32)
Calcium: 9.4 mg/dL (ref 8.9–10.3)
Chloride: 102 mmol/L (ref 98–111)
Creatinine: 1.83 mg/dL — ABNORMAL HIGH (ref 0.61–1.24)
GFR, Estimated: 38 mL/min — ABNORMAL LOW
Glucose, Bld: 227 mg/dL — ABNORMAL HIGH (ref 70–99)
Potassium: 3.8 mmol/L (ref 3.5–5.1)
Sodium: 140 mmol/L (ref 135–145)
Total Bilirubin: 0.6 mg/dL (ref 0.0–1.2)
Total Protein: 6.2 g/dL — ABNORMAL LOW (ref 6.5–8.1)

## 2024-08-27 LAB — TSH: TSH: 3.67 u[IU]/mL (ref 0.350–4.500)

## 2024-08-27 MED ORDER — LEUPROLIDE ACETATE (4 MONTH) 30 MG ~~LOC~~ KIT
30.0000 mg | PACK | Freq: Once | SUBCUTANEOUS | Status: AC
  Administered 2024-08-27: 30 mg via SUBCUTANEOUS
  Filled 2024-08-27: qty 30

## 2024-08-28 LAB — BASIC METABOLIC PANEL WITH GFR
BUN/Creatinine Ratio: 16 (ref 10–24)
BUN: 25 mg/dL (ref 8–27)
CO2: 26 mmol/L (ref 20–29)
Calcium: 9.5 mg/dL (ref 8.6–10.2)
Chloride: 101 mmol/L (ref 96–106)
Creatinine, Ser: 1.58 mg/dL — ABNORMAL HIGH (ref 0.76–1.27)
Glucose: 162 mg/dL — ABNORMAL HIGH (ref 70–99)
Potassium: 3.8 mmol/L (ref 3.5–5.2)
Sodium: 144 mmol/L (ref 134–144)
eGFR: 45 mL/min/1.73 — ABNORMAL LOW

## 2024-08-28 LAB — PSA, TOTAL AND FREE
PSA, Free Pct: UNDETERMINED %
PSA, Free: <0.02 ng/mL
Prostate Specific Ag, Serum: <0.1 ng/mL (ref 0.0–4.0)

## 2024-08-29 ENCOUNTER — Ambulatory Visit: Payer: Self-pay | Admitting: Physician Assistant

## 2024-09-07 ENCOUNTER — Other Ambulatory Visit: Payer: Self-pay

## 2024-09-10 ENCOUNTER — Encounter (HOSPITAL_COMMUNITY): Payer: Self-pay | Admitting: Internal Medicine

## 2024-09-10 ENCOUNTER — Encounter: Payer: Self-pay | Admitting: Nurse Practitioner

## 2024-09-10 ENCOUNTER — Other Ambulatory Visit: Payer: Self-pay

## 2024-09-10 ENCOUNTER — Encounter: Payer: Self-pay | Admitting: Hematology

## 2024-09-11 ENCOUNTER — Other Ambulatory Visit: Payer: Self-pay | Admitting: Pharmacy Technician

## 2024-09-11 ENCOUNTER — Other Ambulatory Visit: Payer: Self-pay

## 2024-09-11 LAB — OPHTHALMOLOGY REPORT-SCANNED

## 2024-09-11 NOTE — Progress Notes (Signed)
 Specialty Pharmacy Refill Coordination Note  Burnice Oestreicher. is a 79 y.o. male contacted today regarding refills of specialty medication(s) Abiraterone  Acetate (ZYTIGA )   Patient requested Delivery   Delivery date: 09/19/24   Verified address: [address], [place], [address]   Medication will be filled on: 09/18/24

## 2024-09-12 ENCOUNTER — Other Ambulatory Visit: Payer: Self-pay

## 2024-09-12 NOTE — Progress Notes (Signed)
 Patient was contacted by the pharmacy regarding their specialty medication(s) Abiraterone  Acetate (ZYTIGA )  to reschedule an earlier delivery date, due to impending cold weather conditions. Medication(s) will be filled 09/13/24 for a delivery by 09/14/24

## 2024-09-13 ENCOUNTER — Other Ambulatory Visit: Payer: Self-pay

## 2024-11-23 ENCOUNTER — Ambulatory Visit

## 2024-12-04 ENCOUNTER — Ambulatory Visit: Admitting: Physician Assistant

## 2024-12-25 ENCOUNTER — Inpatient Hospital Stay

## 2024-12-25 ENCOUNTER — Inpatient Hospital Stay: Admitting: Nurse Practitioner
